# Patient Record
Sex: Male | Born: 1955
Health system: Southern US, Community
[De-identification: ages and names within clinical notes are randomized; demographics above are authoritative.]

## PROBLEM LIST (undated history)

## (undated) DIAGNOSIS — F1010 Alcohol abuse, uncomplicated: Secondary | ICD-10-CM

## (undated) DIAGNOSIS — T7840XA Allergy, unspecified, initial encounter: Secondary | ICD-10-CM

## (undated) DIAGNOSIS — J449 Chronic obstructive pulmonary disease, unspecified: Secondary | ICD-10-CM

## (undated) DIAGNOSIS — F17200 Nicotine dependence, unspecified, uncomplicated: Secondary | ICD-10-CM

## (undated) DIAGNOSIS — J189 Pneumonia, unspecified organism: Secondary | ICD-10-CM

## (undated) DIAGNOSIS — I2510 Atherosclerotic heart disease of native coronary artery without angina pectoris: Secondary | ICD-10-CM

## (undated) DIAGNOSIS — Z9981 Dependence on supplemental oxygen: Secondary | ICD-10-CM

## (undated) DIAGNOSIS — I219 Acute myocardial infarction, unspecified: Secondary | ICD-10-CM

## (undated) DIAGNOSIS — E785 Hyperlipidemia, unspecified: Secondary | ICD-10-CM

## (undated) DIAGNOSIS — A630 Anogenital (venereal) warts: Secondary | ICD-10-CM

## (undated) DIAGNOSIS — J309 Allergic rhinitis, unspecified: Secondary | ICD-10-CM

## (undated) DIAGNOSIS — I10 Essential (primary) hypertension: Secondary | ICD-10-CM

## (undated) DIAGNOSIS — H269 Unspecified cataract: Secondary | ICD-10-CM

## (undated) DIAGNOSIS — Z9289 Personal history of other medical treatment: Secondary | ICD-10-CM

## (undated) DIAGNOSIS — I714 Abdominal aortic aneurysm, without rupture, unspecified: Secondary | ICD-10-CM

## (undated) DIAGNOSIS — Z955 Presence of coronary angioplasty implant and graft: Secondary | ICD-10-CM

## (undated) HISTORY — DX: Anogenital (venereal) warts: A63.0

## (undated) HISTORY — DX: Atherosclerotic heart disease of native coronary artery without angina pectoris: I25.10

## (undated) HISTORY — PX: CARDIAC CATHETERIZATION: SHX172

## (undated) HISTORY — DX: Alcohol abuse, uncomplicated: F10.10

## (undated) HISTORY — DX: Essential (primary) hypertension: I10

## (undated) HISTORY — DX: Allergic rhinitis, unspecified: J30.9

## (undated) HISTORY — PX: VASECTOMY: SHX75

## (undated) HISTORY — PX: FINGER SURGERY: SHX640

## (undated) HISTORY — DX: Unspecified cataract: H26.9

## (undated) HISTORY — DX: Nicotine dependence, unspecified, uncomplicated: F17.200

## (undated) HISTORY — PX: CORONARY ANGIOPLASTY WITH STENT PLACEMENT: SHX49

## (undated) HISTORY — DX: Presence of coronary angioplasty implant and graft: Z95.5

## (undated) HISTORY — DX: Allergy, unspecified, initial encounter: T78.40XA

## (undated) HISTORY — DX: Hyperlipidemia, unspecified: E78.5

---

## 2004-03-10 ENCOUNTER — Observation Stay (HOSPITAL_COMMUNITY): Admission: AD | Admit: 2004-03-10 | Discharge: 2004-03-10 | Payer: Self-pay | Admitting: Orthopedic Surgery

## 2004-03-10 ENCOUNTER — Encounter: Payer: Self-pay | Admitting: Emergency Medicine

## 2006-07-24 ENCOUNTER — Ambulatory Visit: Payer: Self-pay | Admitting: Internal Medicine

## 2006-08-15 ENCOUNTER — Ambulatory Visit: Payer: Self-pay | Admitting: Internal Medicine

## 2006-08-15 LAB — CONVERTED CEMR LAB
AST: 25 units/L (ref 0–37)
Albumin: 4 g/dL (ref 3.5–5.2)
Alkaline Phosphatase: 166 units/L — ABNORMAL HIGH (ref 39–117)
Basophils Relative: 0.5 % (ref 0.0–1.0)
Bilirubin Urine: NEGATIVE
CO2: 30 meq/L (ref 19–32)
Chol/HDL Ratio, serum: 4.3
Cholesterol: 180 mg/dL (ref 0–200)
GFR calc non Af Amer: 69 mL/min
Glomerular Filtration Rate, Af Am: 83 mL/min/{1.73_m2}
Hemoglobin, Urine: NEGATIVE
Lymphocytes Relative: 19.7 % (ref 12.0–46.0)
MCHC: 34.4 g/dL (ref 30.0–36.0)
Monocytes Relative: 8.5 % (ref 3.0–11.0)
Neutro Abs: 5.4 10*3/uL (ref 1.4–7.7)
Neutrophils Relative %: 69.7 % (ref 43.0–77.0)
PSA: 0.76 ng/mL (ref 0.10–4.00)
Platelets: 238 10*3/uL (ref 150–400)
RBC: 5.19 M/uL (ref 4.22–5.81)
TSH: 1.02 microintl units/mL (ref 0.35–5.50)
Total Protein, Urine: NEGATIVE mg/dL
Triglyceride fasting, serum: 82 mg/dL (ref 0–149)
Urine Glucose: NEGATIVE mg/dL
Urobilinogen, UA: 0.2 (ref 0.0–1.0)
VLDL: 16 mg/dL (ref 0–40)
pH: 6 (ref 5.0–8.0)

## 2006-09-04 ENCOUNTER — Ambulatory Visit: Payer: Self-pay | Admitting: Internal Medicine

## 2006-10-01 ENCOUNTER — Ambulatory Visit: Payer: Self-pay | Admitting: Internal Medicine

## 2006-10-20 ENCOUNTER — Ambulatory Visit: Payer: Self-pay | Admitting: Internal Medicine

## 2010-09-16 DIAGNOSIS — I219 Acute myocardial infarction, unspecified: Secondary | ICD-10-CM

## 2010-09-16 HISTORY — DX: Acute myocardial infarction, unspecified: I21.9

## 2010-09-17 ENCOUNTER — Inpatient Hospital Stay (HOSPITAL_COMMUNITY)
Admission: EM | Admit: 2010-09-17 | Discharge: 2010-09-19 | Payer: Self-pay | Source: Home / Self Care | Attending: Internal Medicine | Admitting: Internal Medicine

## 2010-09-21 ENCOUNTER — Encounter: Payer: Self-pay | Admitting: Cardiovascular Disease

## 2010-10-04 ENCOUNTER — Ambulatory Visit
Admission: RE | Admit: 2010-10-04 | Discharge: 2010-10-04 | Payer: Self-pay | Source: Home / Self Care | Attending: Cardiovascular Disease | Admitting: Cardiovascular Disease

## 2010-10-04 DIAGNOSIS — F17210 Nicotine dependence, cigarettes, uncomplicated: Secondary | ICD-10-CM | POA: Insufficient documentation

## 2010-10-04 DIAGNOSIS — I10 Essential (primary) hypertension: Secondary | ICD-10-CM

## 2010-10-04 DIAGNOSIS — I2510 Atherosclerotic heart disease of native coronary artery without angina pectoris: Secondary | ICD-10-CM | POA: Insufficient documentation

## 2010-10-04 DIAGNOSIS — Z87891 Personal history of nicotine dependence: Secondary | ICD-10-CM | POA: Insufficient documentation

## 2010-10-04 DIAGNOSIS — E785 Hyperlipidemia, unspecified: Secondary | ICD-10-CM | POA: Insufficient documentation

## 2010-10-04 DIAGNOSIS — F17200 Nicotine dependence, unspecified, uncomplicated: Secondary | ICD-10-CM

## 2010-10-04 HISTORY — DX: Hyperlipidemia, unspecified: E78.5

## 2010-10-04 HISTORY — DX: Atherosclerotic heart disease of native coronary artery without angina pectoris: I25.10

## 2010-10-04 HISTORY — DX: Essential (primary) hypertension: I10

## 2010-10-04 HISTORY — DX: Nicotine dependence, unspecified, uncomplicated: F17.200

## 2010-10-18 NOTE — Discharge Summary (Addendum)
John [name], John [name]            ACCOUNT NO.:  [id number]  MEDICAL RECORD NO.:  [id number]          PATIENT TYPE:  INP  LOCATION:  2927                         FACILITY:  [facility]  PHYSICIAN:  Arturo Morton. Riley Kill, MD, FACCDATE OF BIRTH:  [birth year]  DATE OF ADMISSION:  09/17/2010 DATE OF DISCHARGE:  09/19/2010                              DISCHARGE SUMMARY   PRIMARY CARDIOLOGIST:  New patient to Dr. Verne Carrow.  PRIMARY CARE PHYSICIAN:  The patient has previously been seen by Dr. Artist Pais in 2007.  DISCHARGE DIAGNOSES: 1. Coronary artery disease, status post cardiac catheterization on     September 18, 2010, with successful percutaneous stenting of the     left anterior descending artery using a Promus Element drug-eluting     stent.  Moderate residual disease involving the circumflex right     coronary artery.  Anterolateral hypokinesis. 2. Dyslipidemia. 3. Tobacco abuse. 4. Alcohol abuse.  ALLERGIES:  PENICILLIUM causing the patient to pass out as a child.  DIAGNOSTICS/PROCEDURES PERFORMED DURING HOSPITALIZATION: 1. Left heart catheterization with selective coronary arteriography     and selective left ventriculography.     a.     Successful percutaneous coronary intervention with Promus      Element stent to the left anterior descending artery.     b.     Moderate residual disease involving the circumflex, diffuse      60-70% narrowing followed by 30 and a 70% stenoses distally.      Right coronary artery about 50% narrowing at the proximal mid      junction and 70% stenosis just distal to this.     c.     Anterolateral hypokinesis noted. 2. Chest x-ray showing no acute cardiopulmonary disease.  HISTORY OF PRESENT ILLNESS:  This is a 55 year old gentleman without prior cardiac history who presented to the [facility] Emergency Department with unstable angina.  The patient states for 2-3 weeks, he was experienced fairly exertional substernal chest pressure radiating to  his elbow, occasional episode of diaphoresis lasting 2-3 minutes when examining at the bed and worse spontaneously.  Several days prior to admission, the patient had recurrent angina at rest.  The patient's EKG in the emergency department showed small inferior Qs, but no acute ischemic changes.  The patient was admitted for diagnostic catheterization.  HOSPITAL COURSE:  The patient was admitted to CCU and placed on heparin, nitroglycerin, beta-blocker, statin, and aspirin.  The patient did rule in for non-ST-elevation myocardial infarction with peak troponin of 1.62.  The patient was taken to the Cath Lab by Dr. Riley Kill, informed consent was obtained.  As noted above, Dr. Riley Kill performed successful percutaneous stenting of the left anterior descending artery.  There was moderate residual disease of the circumflex right coronary artery. Stents were carefully reviewed with the interventionalist team and at this time, they would favor aggressive medical treatment of the moderate disease as they do not appear flow-limiting.  The patient tolerated the procedure well and his cath site was without signs of hematoma.  Risk factor reduction was discussed with the patient and his girlfriend and the patient appeared to be receptive.  He  does have an extensive tobacco history, smoking 3 packs a day.  Nicotine patch has been discussed with the patient and he states that he may be interested in quitting.  He also has heavy alcohol abuse, drinking daily.  Cessation has also been discussed with the patient.  The patient will be discharged on beta- blocker, aspirin, Effient, and Crestor.  On day of discharge, Dr. Riley Kill evaluated the patient and noted him stable for home.  He was without complaints of chest pain.  He did ambulate with cardiac rehab without shortness of breath or chest pain. Cardiac rehab has been offered to the patient about to the safety, cannot participate secondary to work.  The  patient also drinks several [employer] daily.  He has been encouraged to decrease caffeine intake.  The patient also takes [place] powders and NyQuil daily.  He has also been encouraged to discontinue these.  DISCHARGE LAB STUDIES:  WBC 10.4, hemoglobin 16.3, hematocrit 46.6, platelets 197.  Sodium 138, potassium 3.5, chloride 107, bicarb 25, BUN 11, creatinine 0.93.  Creatine kinase 87, CK-MB 4.7, troponin-I 0.91. Cholesterol 217, triglycerides 184, HDL 45, LDL 135.  Hemoglobin A1c 5.7.  DISCHARGE MEDICATIONS: 1. Effient 2 mg one tablet daily. 2. Crestor 40 mg one tablet daily. 3. Coreg 6.25 mg one tablet daily. 4. Aspirin 325 mg daily. 5. Nitroglycerin 0.4 mg sublingual one tablet under tongue as needed     for chest pain. 6. Multivitamin daily. 7. Nicotine 21 mg patch daily.  DISCHARGE PLANS AND INSTRUCTIONS: 1. The patient will follow up with Dr. Clifton James on October 04, 2009 at     10:15. 2. The patient is to follow up with Dr. Artist Pais for re-establishment     within the next week or so. 3. The patient is to increase activity slowly.  No lifting for 1 week     greater than 5 pounds.  No sexual activity for 1 week.  Continue     low-sodium heart-healthy diet.  Keep cath site clean and dry, and     he is to call our office for any problems.  DURATION OF DISCHARGE:  Greater than 30 minutes including physician and physician extender time.     Leonette Monarch, PA-C   ______________________________ Arturo Morton Riley Kill, MD, [facility]    NB/MEDQ  D:  09/19/2010  T:  09/19/2010  Job:  [id number]  cc:   Verne Carrow, MD Barbette Hair. Artist Pais, DO  Electronically Signed by Alen Blew P.A. on 10/02/2010 08:04:36 PM Electronically Signed by Shawnie Pons MD [facility] on 10/18/2010 04:41:44 AM

## 2010-10-25 NOTE — Letter (Signed)
Summary: Return To Work  [employer], Main Office  [address]   [place], [place] [address]   Phone: [phone]  Fax: [phone]    09/21/2010  TO: WHOM IT MAY CONCERN   RE: John [name] [address] ,[id number]   The above named individual is under my medical care and may return to work on: Tuesday, September 25, 2010.   If you have any further questions or need additional information, please call.     Sincerely,    Dr. Earney Hamburg, MD

## 2010-10-25 NOTE — Assessment & Plan Note (Signed)
Summary: eph  Medications Added ASPIRIN EC 325 MG TBEC (ASPIRIN) Take one tablet by mouth daily CARVEDILOL 6.25 MG TABS (CARVEDILOL) Take one tablet by mouth twice a day CRESTOR 40 MG TABS (ROSUVASTATIN CALCIUM) Take one tablet by mouth daily. MULTIVITAMINS   TABS (MULTIPLE VITAMIN) 1 tab by mouth once daily NITROSTAT 0.4 MG SUBL (NITROGLYCERIN) 1 tablet under tongue at onset of chest pain; you may repeat every 5 minutes for up to 3 doses. EFFIENT 10 MG TABS (PRASUGREL HCL) 1 tab by mouth once daily        Visit Type:  Follow-up Primary Provider:  Thomos Lemons, MD  CC:  follow up hospital.  History of Present Illness: 55 yo WM with history of tobacco abuse, HTN, Hyperlipidemia admitted to [facility] 09/16/10 with chest pain. He ruled in for a NSTEMI with cardiac enzymes. He underwent diagnostic heart cath on 09/17/10 and was found to have a severe stenosis of the LAD. Dr. Riley Kill placed a drug eluting stent in  the LAD. He was also found to have moderate disease in the RCA (70%) and Circumflex (70%) but this was not treated. He has done well since discharge. No chest pain or SOB. He has cut back to 2ppd.   Current Medications (verified): 1)  Aspirin Ec 325 Mg Tbec (Aspirin) .... Take One Tablet By Mouth Daily 2)  Carvedilol 6.25 Mg Tabs (Carvedilol) .... Take One Tablet By Mouth Twice A Day 3)  Crestor 40 Mg Tabs (Rosuvastatin Calcium) .... Take One Tablet By Mouth Daily. 4)  Multivitamins   Tabs (Multiple Vitamin) .Marland Kitchen.. 1 Tab By Mouth Once Daily 5)  Nitrostat 0.4 Mg Subl (Nitroglycerin) .Marland Kitchen.. 1 Tablet Under Tongue At Onset of Chest Pain; You May Repeat Every 5 Minutes For Up To 3 Doses. 6)  Effient 10 Mg Tabs (Prasugrel Hcl) .Marland Kitchen.. 1 Tab By Mouth Once Daily  Past History:  Past Medical History:  Coronary artery disease, status post cardiac catheterization on       September 18, 2010, with successful percutaneous stenting of the       left anterior descending artery using a Promus  Element drug-eluting       stent.  Moderate residual disease involving the circumflex right       coronary artery.  Anterolateral hypokinesis.  Dyslipidemia.   Tobacco abuse 2ppd  (Former 4ppd)   Alcohol abuse      Past Surgical History: Finger trauma-almost cut it off Vasectomy  Family History: Reviewed history from 10/03/2010 and no changes required. Mother died of brain cancer at 2 Father died of an  MI at 40.   He has two uncles on his father's side who are status post   CABG.   He has a brother who he believes to be alive and well.   Social History: Reviewed history from 10/03/2010 and no changes required. The patient lives in [place] with his girlfriend.  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 Former 4ppd for 25 years.   He drinks a 6-pack of beer per night.   Denies drug use.  He is not routinely exercising.      Review of Systems  The patient denies fatigue, malaise, fever, weight gain/loss, vision loss, decreased hearing, hoarseness, chest pain, palpitations, shortness of breath, prolonged cough, wheezing, sleep apnea, coughing up blood, abdominal pain, blood in  stool, nausea, vomiting, diarrhea, heartburn, incontinence, blood in urine, muscle weakness, joint pain, leg swelling, rash, skin lesions, headache, fainting, dizziness, depression, anxiety, enlarged lymph nodes, easy bruising or bleeding, and environmental allergies.    Vital Signs:  Patient profile:   55 year old male Height:      71 inches Weight:      180 pounds BMI:     25.20 Pulse rate:   73 / minute Resp:     14 per minute BP sitting:   130 / 70  (left arm)  Vitals Entered By: Kem Parkinson (October 04, 2010 3:58 PM)  Physical Exam  General:  General: Well developed, well nourished, NAD HEENT: OP clear, mucus membranes moist SKIN: warm, dry Neuro: No focal  deficits Musculoskeletal: Muscle strength 5/5 all ext Psychiatric: Mood and affect normal Neck: No JVD, no carotid bruits, no thyromegaly, no lymphadenopathy. Lungs:Clear bilaterally, no wheezes, rhonci, crackles CV: RRR no murmurs, gallops rubs Abdomen: soft, NT, ND, BS present Extremities: No edema, pulses 2+.    Cardiac Cath  Procedure date:  09/17/2010  Findings:      LAD 95%. There was some narrowing of the septal and diagonal, but TIMI 3 flow in both.  The circumflex is a moderate-sized vessel.  There is diffuse 60-70% narrowing followed by 30% and a 70% stenosis more distally.  Distal runoff is good.  The right coronary artery has about 50% narrowing right at the proximal mid junction and a 70% stenosis just distal to this.  The vessel opens up into the posterolateral segment.  Ventriculography in the RAO projection reveals anterolateral hypokinesis.  s/p PTCA/DES LAD by Dr. Riley Kill  Impression & Recommendations:  Problem # 1:  CAD, NATIVE VESSEL (ICD-414.01) Stable post PCI. Residual disease in RCA and Circumflex. Will continue current meds. Will need lipids and LFTs in 12 weeks. Will see in 3 months. Manage moderate disease medically for now.   His updated medication list for this problem includes:    Aspirin Ec 325 Mg Tbec (Aspirin) .Marland Kitchen... Take one tablet by mouth daily    Carvedilol 6.25 Mg Tabs (Carvedilol) .Marland Kitchen... Take one tablet by mouth twice a day    Nitrostat 0.4 Mg Subl (Nitroglycerin) .Marland Kitchen... 1 tablet under tongue at onset of chest pain; you may repeat every 5 minutes for up to 3 doses.    Effient 10 Mg Tabs (Prasugrel hcl) .Marland Kitchen... 1 tab by mouth once daily  Problem # 2:  HYPERTENSION, BENIGN (ICD-401.1) Stable.   His updated medication list for this problem includes:    Aspirin Ec 325 Mg Tbec (Aspirin) .Marland Kitchen... Take one tablet by mouth daily    Carvedilol 6.25 Mg Tabs (Carvedilol) .Marland Kitchen... Take one tablet by mouth twice a day  Problem # 3:  HYPERLIPIDEMIA-MIXED  (ICD-272.4) Continue statin.   His updated medication list for this problem includes:    Crestor 40 Mg Tabs (Rosuvastatin calcium) .Marland Kitchen... Take one tablet by mouth daily.  Problem # 4:  TOBACCO ABUSE (ICD-305.1) Smoking cessation encouraged.   Patient Instructions: 1)  Your physician recommends that you schedule a follow-up appointment in: 3  months 2)  Your physician recommends that you continue on your current medications as directed. Please refer to the Current Medication list given to you today. Prescriptions: EFFIENT 10 MG TABS (PRASUGREL HCL) 1 tab by mouth once daily  #90 x 3   Entered by:   Kem Parkinson   Authorized by:   Verne Carrow, MD   Signed by:  Kimalexis Barnes on 10/04/2010   Method used:   Faxed to ...       [facility] (mail-order)             , [place]         Ph: [phone]       Fax: [phone]   RxID:   [id number] CRESTOR 40 MG TABS (ROSUVASTATIN CALCIUM) Take one tablet by mouth daily.  #90 x 3   Entered by:   Kem Parkinson   Authorized by:   Verne Carrow, MD   Signed by:   Kem Parkinson on 10/04/2010   Method used:   Faxed to ...       [facility] (mail-order)             , [place]         Ph: [phone]       Fax: [phone]   RxID:   [id number] CARVEDILOL 6.25 MG TABS (CARVEDILOL) Take one tablet by mouth twice a day  #180 x 3   Entered by:   Kem Parkinson   Authorized by:   Verne Carrow, MD   Signed by:   Kem Parkinson on 10/04/2010   Method used:   Faxed to ...       [facility] (mail-order)             , [place]         Ph: [phone]       Fax: [phone]   RxID:   [id number]

## 2010-12-03 LAB — COMPREHENSIVE METABOLIC PANEL
ALT: 28 U/L (ref 0–53)
AST: 28 U/L (ref 0–37)
Albumin: 4.1 g/dL (ref 3.5–5.2)
Alkaline Phosphatase: 142 U/L — ABNORMAL HIGH (ref 39–117)
BUN: 12 mg/dL (ref 6–23)
BUN: 5 mg/dL — ABNORMAL LOW (ref 6–23)
CO2: 24 mEq/L (ref 19–32)
CO2: 28 mEq/L (ref 19–32)
Calcium: 9.1 mg/dL (ref 8.4–10.5)
Chloride: 97 mEq/L (ref 96–112)
Creatinine, Ser: 0.91 mg/dL (ref 0.4–1.5)
Creatinine, Ser: 1.01 mg/dL (ref 0.4–1.5)
GFR calc non Af Amer: >60 mL/min (ref >60)
Glucose, Bld: 97 mg/dL (ref 70–99)
Total Bilirubin: 0.6 mg/dL (ref 0.3–1.2)
Total Protein: 5.9 g/dL — ABNORMAL LOW (ref 6.0–8.3)
Total Protein: 7.2 g/dL (ref 6.0–8.3)

## 2010-12-03 LAB — CBC
Hemoglobin: 19.2 g/dL — ABNORMAL HIGH (ref 13.0–17.0)
MCHC: 34.6 g/dL (ref 30.0–36.0)
RBC: 4.56 MIL/uL (ref 4.22–5.81)
RBC: 4.74 MIL/uL (ref 4.22–5.81)
RBC: 5.25 MIL/uL (ref 4.22–5.81)
RDW: 12.3 % (ref 11.5–15.5)
WBC: 10.2 10*3/uL (ref 4.0–10.5)
WBC: 10.4 10*3/uL (ref 4.0–10.5)

## 2010-12-03 LAB — CARDIAC PANEL(CRET KIN+CKTOT+MB+TROPI)
CK, MB: 10.9 ng/mL (ref 0.3–4.0)
CK, MB: 4.7 ng/mL — ABNORMAL HIGH (ref 0.3–4.0)
CK, MB: 5.5 ng/mL — ABNORMAL HIGH (ref 0.3–4.0)
Relative Index: 7.9 — ABNORMAL HIGH (ref 0.0–2.5)
Relative Index: INVALID (ref 0.0–2.5)
Total CK: 134 U/L (ref 7–232)
Total CK: 163 U/L (ref 7–232)
Total CK: 91 U/L (ref 7–232)
Troponin I: 0.55 ng/mL (ref 0.00–0.06)
Troponin I: 0.91 ng/mL (ref 0.00–0.06)
Troponin I: 1.18 ng/mL (ref 0.00–0.06)
Troponin I: 1.2 ng/mL (ref 0.00–0.06)
Troponin I: 1.62 ng/mL (ref 0.00–0.06)

## 2010-12-03 LAB — POCT CARDIAC MARKERS
CKMB, poc: 3.1 ng/mL (ref 1.0–8.0)
Troponin i, poc: <0.05 ng/mL (ref 0.00–0.09)

## 2010-12-03 LAB — LIPID PANEL
Cholesterol: 217 mg/dL — ABNORMAL HIGH (ref 0–200)
LDL Cholesterol: 135 mg/dL — ABNORMAL HIGH (ref 0–99)
Triglycerides: 184 mg/dL — ABNORMAL HIGH (ref <150)
VLDL: 37 mg/dL (ref 0–40)

## 2010-12-03 LAB — DIFFERENTIAL
Basophils Absolute: 0.1 10*3/uL (ref 0.0–0.1)
Eosinophils Relative: 2 % (ref 0–5)
Lymphocytes Relative: 32 % (ref 12–46)
Lymphs Abs: 2.9 10*3/uL (ref 0.7–4.0)
Neutro Abs: 5.3 10*3/uL (ref 1.7–7.7)
Neutrophils Relative %: 58 % (ref 43–77)

## 2010-12-03 LAB — BASIC METABOLIC PANEL
BUN: 11 mg/dL (ref 6–23)
Calcium: 8.8 mg/dL (ref 8.4–10.5)
GFR calc Af Amer: >60 mL/min (ref >60)
GFR calc non Af Amer: >60 mL/min (ref >60)
Glucose, Bld: 99 mg/dL (ref 70–99)

## 2010-12-03 LAB — TSH: TSH: 3.921 u[IU]/mL (ref 0.350–4.500)

## 2010-12-03 LAB — PROTIME-INR: INR: 0.97 (ref 0.00–1.49)

## 2010-12-03 LAB — HEPARIN LEVEL (UNFRACTIONATED)
Heparin Unfractionated: 0.31 IU/mL (ref 0.30–0.70)
Heparin Unfractionated: <0.1 IU/mL — ABNORMAL LOW (ref 0.30–0.70)

## 2010-12-03 LAB — HEMOGLOBIN A1C: Mean Plasma Glucose: 117 mg/dL — ABNORMAL HIGH (ref <117)

## 2010-12-27 ENCOUNTER — Other Ambulatory Visit (INDEPENDENT_AMBULATORY_CARE_PROVIDER_SITE_OTHER): Payer: BC Managed Care – PPO

## 2010-12-27 ENCOUNTER — Ambulatory Visit (INDEPENDENT_AMBULATORY_CARE_PROVIDER_SITE_OTHER): Payer: BC Managed Care – PPO | Admitting: Internal Medicine

## 2010-12-27 ENCOUNTER — Encounter: Payer: Self-pay | Admitting: Internal Medicine

## 2010-12-27 VITALS — BP 142/82 | HR 77 | Temp 98.1°F | Resp 16 | Ht 70.5 in | Wt 170.2 lb

## 2010-12-27 DIAGNOSIS — J309 Allergic rhinitis, unspecified: Secondary | ICD-10-CM

## 2010-12-27 DIAGNOSIS — Z0000 Encounter for general adult medical examination without abnormal findings: Secondary | ICD-10-CM

## 2010-12-27 DIAGNOSIS — F1010 Alcohol abuse, uncomplicated: Secondary | ICD-10-CM

## 2010-12-27 HISTORY — DX: Allergic rhinitis, unspecified: J30.9

## 2010-12-27 HISTORY — DX: Alcohol abuse, uncomplicated: F10.10

## 2010-12-27 NOTE — Progress Notes (Signed)
Subjective:    Patient ID: John [name], male    DOB: 06/05/1956, 55 y.o.   MRN: [id number]  HPI  Here for wellness and f/u;  Overall doing ok;  Pt denies CP, worsening SOB, DOE, wheezing, orthopnea, PND, worsening LE edema, palpitations, dizziness or syncope.  Pt denies neurological change such as new Headache, facial or extremity weakness.  Pt denies polydipsia, polyuria, or low sugar symptoms. Pt states overall good compliance with treatment and medications, good tolerability, and trying to follow lower cholesterol diet.  Pt denies worsening depressive symptoms, suicidal ideation or panic. No fever, wt loss, night sweats, loss of appetite, or other constitutional symptoms.  Pt states good ability with ADL's, low fall risk, home safety reviewed and adequate, no significant changes in hearing or vision, and occasionally active with exercise.  No complaints today Past Medical History  Diagnosis Date  . HYPERLIPIDEMIA-MIXED 10/04/2010  . TOBACCO ABUSE 10/04/2010  . HYPERTENSION, BENIGN 10/04/2010  . CAD, NATIVE VESSEL 10/04/2010  . Alcohol abuse 12/27/2010  . Allergic rhinitis, cause unspecified 12/27/2010   Past Surgical History  Procedure Date  . Finger trauma     almost cut off  . Vasectomy     reports that he has been smoking.  He does not have any smokeless tobacco history on file. He reports that he drinks alcohol. He reports that he does not use illicit drugs. family history is not on file. Allergies  Allergen Reactions  . Penicillins    Current Outpatient Prescriptions on File Prior to Visit  Medication Sig Dispense Refill  . aspirin 325 MG tablet Take 325 mg by mouth daily.        . carvedilol (COREG) 6.25 MG tablet Take 6.25 mg by mouth 2 (two) times daily.        . Multiple Vitamin (MULTIVITAMIN) capsule Take 1 capsule by mouth daily.        . nitroGLYCERIN (NITROSTAT) 0.4 MG SL tablet Place 0.4 mg under the tongue. 1 tablet under tongue at onset of chest pain, you may repeat  every 5 minutes for up to 3 doses.       . prasugrel (EFFIENT) 10 MG TABS Take by mouth daily.        . rosuvastatin (CRESTOR) 40 MG tablet Take 40 mg by mouth daily.         Review of Systems Review of Systems  Constitutional: Negative for diaphoresis, activity change, appetite change and unexpected weight change.  HENT: Negative for hearing loss, ear pain, facial swelling, mouth sores and neck stiffness.   Eyes: Negative for pain, redness and visual disturbance.  Respiratory: Negative for shortness of breath and wheezing.   Cardiovascular: Negative for chest pain and palpitations.  Gastrointestinal: Negative for diarrhea, blood in stool, abdominal distention and rectal pain.  Genitourinary: Negative for hematuria, flank pain and decreased urine volume.  Musculoskeletal: Negative for myalgias and joint swelling.  Skin: Negative for color change and wound.  Neurological: Negative for syncope and numbness.  Hematological: Negative for adenopathy.  Psychiatric/Behavioral: Negative for hallucinations, self-injury, decreased concentration and agitation.      Objective:   Physical ExamBP 142/82  Pulse 77  Temp(Src) 98.1 F (36.7 C) (Oral)  Resp 16  Ht 5' 10.5" (1.791 m)  Wt 170 lb 4 oz (77.225 kg)  BMI 24.08 kg/m2  SpO2 97%  Physical Exam  VS noted Constitutional: Pt is oriented to person, place, and time. Appears well-developed and well-nourished.  HENT:  Head: Normocephalic and atraumatic.  Right Ear: External ear normal.  Left Ear: External ear normal.  Nose: Nose normal.  Mouth/Throat: Oropharynx is clear and moist.  Eyes: Conjunctivae and EOM are normal. Pupils are equal, round, and reactive to light.  Neck: Normal range of motion. Neck supple. No JVD present. No tracheal deviation present.  Cardiovascular: Normal rate, regular rhythm, normal heart sounds and intact distal pulses.   Pulmonary/Chest: Effort normal and breath sounds normal.  Abdominal: Soft. Bowel sounds are  normal. There is no tenderness.  Musculoskeletal: Normal range of motion. Exhibits no edema.  Lymphadenopathy:  Has no cervical adenopathy.  Neurological: Pt is alert and oriented to person, place, and time. Pt has normal reflexes. No cranial nerve deficit.  Skin: Skin is warm and dry. No rash noted.  Psychiatric:  Has  normal mood and affect. Behavior is normal except mild anxious         Assessment & Plan:

## 2010-12-27 NOTE — Assessment & Plan Note (Signed)

## 2010-12-27 NOTE — Patient Instructions (Signed)
Continue all other medications as before Please go to LAB in the Basement for the blood and/or urine tests to be done today Please call the number on the [facility] Card (the [employer] System) for results of testing in 2-3 days Please return in 6 months, or sooner if needed

## 2010-12-27 NOTE — Progress Notes (Signed)
Quick Note:  Voice message left on [employer] system - lab is negative, normal or otherwise stable, pt to continue same tx ______ 

## 2011-01-02 ENCOUNTER — Encounter: Payer: Self-pay | Admitting: Cardiovascular Disease

## 2011-01-02 ENCOUNTER — Ambulatory Visit (INDEPENDENT_AMBULATORY_CARE_PROVIDER_SITE_OTHER): Payer: BC Managed Care – PPO | Admitting: Cardiovascular Disease

## 2011-01-02 VITALS — BP 158/84 | HR 70 | Resp 18 | Ht 71.0 in | Wt 170.0 lb

## 2011-01-02 DIAGNOSIS — I10 Essential (primary) hypertension: Secondary | ICD-10-CM

## 2011-01-02 DIAGNOSIS — E785 Hyperlipidemia, unspecified: Secondary | ICD-10-CM

## 2011-01-02 DIAGNOSIS — F17200 Nicotine dependence, unspecified, uncomplicated: Secondary | ICD-10-CM

## 2011-01-02 DIAGNOSIS — R0600 Dyspnea, unspecified: Secondary | ICD-10-CM

## 2011-01-02 DIAGNOSIS — Z720 Tobacco use: Secondary | ICD-10-CM

## 2011-01-02 DIAGNOSIS — I2510 Atherosclerotic heart disease of native coronary artery without angina pectoris: Secondary | ICD-10-CM

## 2011-01-02 DIAGNOSIS — R0609 Other forms of dyspnea: Secondary | ICD-10-CM

## 2011-01-02 MED ORDER — AMLODIPINE BESYLATE 10 MG PO TABS: 10.0000 mg | ORAL_TABLET | Freq: Every day | ORAL | Status: DC

## 2011-01-02 MED ORDER — CARVEDILOL 3.125 MG PO TABS: 3.1250 mg | ORAL_TABLET | Freq: Two times a day (BID) | ORAL | Status: DC

## 2011-01-02 MED ORDER — PRASUGREL HCL 10 MG PO TABS: 10.0000 mg | ORAL_TABLET | Freq: Every day | ORAL | Status: DC

## 2011-01-02 MED ORDER — ROSUVASTATIN CALCIUM 40 MG PO TABS: 40.0000 mg | ORAL_TABLET | Freq: Every day | ORAL | Status: DC

## 2011-01-02 NOTE — Assessment & Plan Note (Signed)
BP is still elevated. Will add Norvasc  10 mg po Qdaily.

## 2011-01-02 NOTE — Patient Instructions (Signed)
Your physician recommends that you schedule a follow-up appointment in: 6-8 weeks with Dr. Clifton James  Your physician has recommended you make the following change in your medication:  DECREASE COREG 3.125 mg by mouth twice daily. INCREASE NORVASC to 10 mg daily.   Your physician has requested that you have an echocardiogram. Echocardiography is a painless test that uses sound waves to create images of your heart. It provides your doctor with information about the size and shape of your heart and how well your heart's chambers and valves are working. This procedure takes approximately one hour. There are no restrictions for this procedure.

## 2011-01-02 NOTE — Progress Notes (Signed)
History of Present Illness:54 yo WM with history of tobacco abuse, CAD , HTN and hyperlipidemia admitted to [facility] 09/16/10 with chest pain. He ruled in for a NSTEMI with cardiac enzymes. He underwent diagnostic heart cath on 09/17/10 and was found to have a severe stenosis of the LAD. Dr. Riley Kill placed a drug eluting stent in  the LAD. He was also found to have moderate disease in the RCA (70%) and Circumflex (70%) but this was not treated. I saw him in January for hospital follow up. He is here today for follow up. He is still smoking. He describes overall fatigue, feeling constantly tired, some dyspnea. No chest pain.   Past Medical History  Diagnosis Date  . HYPERLIPIDEMIA-MIXED 10/04/2010  . TOBACCO ABUSE 10/04/2010  . HYPERTENSION, BENIGN 10/04/2010  . CAD, NATIVE VESSEL 10/04/2010  . Alcohol abuse 12/27/2010  . Allergic rhinitis, cause unspecified 12/27/2010    Past Surgical History  Procedure Date  . Finger trauma     almost cut off  . Vasectomy     Current Outpatient Prescriptions  Medication Sig Dispense Refill  . aspirin 325 MG tablet Take 325 mg by mouth daily.        . carvedilol (COREG) 6.25 MG tablet Take 6.25 mg by mouth 2 (two) times daily.        . Multiple Vitamin (MULTIVITAMIN) capsule Take 1 capsule by mouth daily.        . nitroGLYCERIN (NITROSTAT) 0.4 MG SL tablet Place 0.4 mg under the tongue. 1 tablet under tongue at onset of chest pain, you may repeat every 5 minutes for up to 3 doses.       . prasugrel (EFFIENT) 10 MG TABS Take by mouth daily.        . rosuvastatin (CRESTOR) 40 MG tablet Take 40 mg by mouth daily.          Allergies  Allergen Reactions  . Penicillins     History   Social History  . Marital Status: Single    Spouse Name: N/A    Number of Children: N/A  . Years of Education: N/A   Occupational History  . Electrical maintenance work      [employer]   Social History Main Topics  . Smoking status: Current Everyday Smoker  .  Smokeless tobacco: Not on file   Comment: 2 plus packs per day. He has a 100 + pack - year history of tobacco abuse currently. Former 4 ppd for 25 years.  . Alcohol Use: Yes     He drinks a 6 - pack of beer per night  . Drug Use: No  . Sexually Active: Not on file   Other Topics Concern  . Not on file   Social History Narrative   The patient lives in [place] with his girlfriend. 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    No family history on file.  Review of Systems:  As stated in the HPI and otherwise negative.   BP 158/84  Pulse 70  Resp 18  Ht 5\' 11"  (1.803 m)  Wt 170 lb (77.111 kg)  BMI 23.71 kg/m2  Physical Examination: General: Well developed, well nourished, NAD HEENT: OP clear, mucus membranes moist SKIN: warm, dry. No rashes. Neuro: No focal deficits Musculoskeletal: Muscle strength 5/5 all ext Psychiatric: Mood and affect normal Neck: No JVD, no carotid bruits, no thyromegaly, no lymphadenopathy. Lungs:Clear bilaterally, no wheezes, rhonci, crackles Cardiovascular: Regular rate and  rhythm. No murmurs, gallops or rubs. Abdomen:Soft. Bowel sounds present. Non-tender.  Extremities: No lower extremity edema. Pulses are 2 + in the bilateral DP/PT.  EKG:

## 2011-01-02 NOTE — Assessment & Plan Note (Signed)
Cessation encouraged 

## 2011-01-02 NOTE — Assessment & Plan Note (Signed)
Will check echo to assess LVEF post NSTEMI.

## 2011-01-02 NOTE — Assessment & Plan Note (Addendum)
Stable. He has had no chest pain. Fatigue may be due to the beta blocker. I will reduce his Coreg to 3.125 mg po BID. Continue ASA, Effient, statin. Samples of Effient and Crestor given today.

## 2011-01-09 ENCOUNTER — Ambulatory Visit (HOSPITAL_COMMUNITY): Payer: BC Managed Care – PPO | Attending: Cardiovascular Disease | Admitting: Radiology

## 2011-01-09 DIAGNOSIS — R0600 Dyspnea, unspecified: Secondary | ICD-10-CM

## 2011-01-09 DIAGNOSIS — I2510 Atherosclerotic heart disease of native coronary artery without angina pectoris: Secondary | ICD-10-CM

## 2011-01-09 DIAGNOSIS — R5383 Other fatigue: Secondary | ICD-10-CM

## 2011-01-09 DIAGNOSIS — R072 Precordial pain: Secondary | ICD-10-CM

## 2011-01-09 DIAGNOSIS — R0989 Other specified symptoms and signs involving the circulatory and respiratory systems: Secondary | ICD-10-CM | POA: Insufficient documentation

## 2011-01-09 DIAGNOSIS — R5381 Other malaise: Secondary | ICD-10-CM

## 2011-01-09 DIAGNOSIS — R0609 Other forms of dyspnea: Secondary | ICD-10-CM | POA: Insufficient documentation

## 2011-01-25 ENCOUNTER — Telehealth: Payer: Self-pay | Admitting: Cardiovascular Disease

## 2011-01-25 NOTE — Telephone Encounter (Signed)
Per pt girl-friend called- wants to discuss smoking options.

## 2011-01-25 NOTE — Telephone Encounter (Signed)
Pt's girlfriend calling wanting information on smoking cessation--info given--they will call back when they decide which therapy they want--nt

## 2011-02-08 NOTE — Op Note (Signed)
NAME:  John [name], John [name]                      ACCOUNT NO.:  [id number]   MEDICAL RECORD NO.:  [id number]                   PATIENT TYPE:  INP   LOCATION:  2550                                 FACILITY:  [facility]   PHYSICIAN:  Katy Fitch. Naaman Plummer., M.D.          DATE OF BIRTH:  12/22/1955   DATE OF PROCEDURE:  03/10/2004  DATE OF DISCHARGE:  03/10/2004                                 OPERATIVE REPORT   PREOPERATIVE DIAGNOSIS:  Status post crushing injury to left index finger  due to jack failure and transmission crushing episode while working on a  motor vehicle.   POSTOPERATIVE DIAGNOSIS:  Status post crushing injury to left index finger  due to jack failure and transmission crushing episode while working on a  motor vehicle.  Identification of open fracture of distal phalangeal shaft  and complex disruption of dorsal intermediate and ventral nail fold.   OPERATION PERFORMED:  1. Irrigation and debridement of open fracture of left index finger distal     phalanx.  2. Open reduction internal fixation of left index finger distal phalanx     utilizing a 0.035 inch intramedullary Kirschner wire crossing the distal     interphalangeal joint.  3. Reconstruction of the pulp and nail fold utilizing 7-0 chromic sutures in     the nail matrix, dorsal, intermediate and ventral nail fold and 5-0 nylon     suture in the pulp.   SURGEON:  Katy Fitch. Sypher, M.D.   ASSISTANT:  Jonni Sanger, P.A.   ANESTHESIA:  2% lidocaine and 0.25% Marcaine metacarpal head level block,  left index finger.   SUPERVISING ANESTHESIOLOGIST:  Kaylyn Layer. Michelle Piper, M.D.   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  He had a complex open fracture of the distal  phalangeal segment of the left index finger with  complex soft tissue  disruption.  His neurovascular bundles were exposed.  However, he did  maintain some stability in the displaced pulp.  He was seen at [facility] by Rhae Lerner. Margretta Ditty, M.D., attending emergency room physician,  who made the diagnosis of an open fracture.  Dr. Margretta Ditty attempted to  contact Vickki Hearing, M.D., the attending orthopedic surgeon on call  and Dr. Romeo Apple essentially declined attending in the emergency room.  Therefore, Mr. Fyock was transferred to the [facility] operating  room directly for appropriate care of his injury.  After informed consent,  he was brought to the operating room at this time.  Preoperatively, he was  noted to be allergic to penicillin.  He was given 1 g Ancef without  consequence in the emergency room by Dr. Margretta Ditty and has been given tetanus  prophylaxis.   DESCRIPTION OF PROCEDURE:  Chrles Selley was brought to the operating  room and placed in supine position on the operating table.  Following light  sedation, the left arm was prepped with Betadine and 0.25% Marcaine and 2%  lidocaine metacarpal head level block was placed with sheath technique as  well as infiltration surrounding the metacarpal neck to obtain anesthesia of  the common sensory branches and dorsal radial sensory branches serving the  index finger.   When anesthesia was satisfactory, the index finger was thoroughly scrubbed  with Betadine soap and solution and irrigated with 1 L of sterile saline  followed by routine Betadine prep.  The finger was exsanguinated with a  gauze wrap and a half inch Penrose drain placed at the P1 segment as a  digital tourniquet.  The fracture was irrigated and clot and other debris  removed.  A 0.035 inch Kirschner wire was placed with intramedullary  technique retrograde style anatomically reducing the distal phalanx and  securing the DIP joint at neutral.  The nail matrix was reassembled jigsaw   puzzle style utilizing  multiple interrupted sutures of 7-0 chromic.  The  dorsal, intermediate and ventral nail fold were anatomically repaired.  The  nail plate was replaced after cleaning and soaking in Betadine.  The pulp  lacerations were then repaired with interrupted suture of 5-0 nylon.  The  finger as dressed with Xeroflo, sterile gauze and Coban.  An Alumafoam split  was placed to protect the construct.   For aftercare, Mr. Grizzell is advised to elevate his hand for one week.  He  has been given prescriptions for Percocet 5 mg one or two tablets by mouth  every four to six hours as needed for pain, 28 tablets without refill.  He  is encouraged to use Motrin as an additional analgesic.  He was given  Levaquin 500 mg one by mouth daily times five days as a prophylactic  antibiotic.                                               Katy Fitch Naaman Plummer., M.D.    RVS/MEDQ  D:  03/10/2004  T:  03/12/2004  Job:  [id number]   cc:   Rhae Lerner. Margretta Ditty, M.D.  [address]  [place]  [place] [address]

## 2011-02-08 NOTE — Assessment & Plan Note (Signed)
[facility]                             PRIMARY CARE OFFICE NOTE   NAME:John [name], John [name]                   MRN:          [id number]  DATE:07/24/2006                            DOB:          07/10/1956    CHIEF COMPLAINT:  New patient to the practice.   HISTORY OF PRESENT ILLNESS:  The patient is a 55 year old white male here to  establish primary care. He has lived in the [place] area for many years,  but has not had a primary care physician. The patient has a long history of  tobacco abuse, but denies any history of high blood pressure, heart disease  or stroke.   The main reason for his visit today has been difficulty with anal  soreness/growths in the anal area. He states that he has had some mild  bleeding, but denies history of recurrent hemorrhoids.   PAST MEDICAL HISTORY:  1. Tobacco abuse.  2. Childhood chickenpox.  3. Chronic alcohol use.   CURRENT MEDICATIONS:  None.   ALLERGIES TO MEDICATIONS:  PENICILLIN.   SOCIAL HISTORY:  The patient is divorced and is currently living with a  girlfriend and has three grown children.   FAMILY HISTORY:  Father deceased at age 56, secondary to myocardial  infarction. Mother is alive at age 77. He is unsure if she has had a minor  heart attack in the past. He also has several uncles with coronary artery  disease. The patient denies any family history of cancer. No diabetes.   HABITS:  He averages approximately 6-8 beers per night and also has been  smoking 2-3 packs per day since age 5. Denies recreational drug use.   REVIEW OF SYSTEMS:  No fevers, chills. No HEENT symptoms. The patient has  mild dyspnea on exertion, but denies any chest pain. Denies any chronic  cough. Occasional heartburn. No nausea or vomiting, constipation or  diarrhea. He has some difficulty with passing stools. No dysuria, frequency,  urgency and all other systems negative.   PHYSICAL EXAMINATION:  VITAL SIGNS:  Height: 5 feet 11 inches. Weight: 178  pounds. Temperature: 97.2. Pulse: 94. Blood pressure: 144/85 in the left arm  in a seated position.  GENERAL: The patient is a pleasant, well-developed, well-nourished 49-year-  old white male who appears slightly older than his stated age.  HEENT: Normocephalic, atraumatic. Pupils are equal and reactive to light  bilaterally. Extraocular muscle was intact. The patient was anicteric.  Conjunctivae were within normal limits. External auditory canals and  tympanic membranes are clear bilaterally. Hearing was grossly normal.  Oropharyngeal examination was unremarkable.  NECK:  Was supple. No adenopathy or carotid bruits or thyromegaly.  CHEST: Normal respiratory effort. Chest was clear to auscultation  bilaterally. No rhonchi, rales or wheezing.  CARDIOVASCULAR: Regular rate and rhythm. No significant murmurs, rubs or  gallops appreciated.  ABDOMEN: Soft and nontender. Positive bowel sounds. No organomegaly.  RECTAL: Examination of his rectal area showed large verrucous growths,  unable to perform rectal examination due to patient discomfort.  Examination of his penis noted a penile lesion on mid-shaft.  MUSCULOSKELETAL: No  clubbing, cyanosis or edema.  SKIN: Was warm and dry.  NEUROLOGIC: Cranial nerves II-XII grossly intact, nonfocal.   IMPRESSION/RECOMMENDATIONS:  1. Condyloma anus, rule out cancer.  2. Ongoing tobacco abuse.  3. Alcohol abuse.  4. Elevated blood pressure without diagnosis of hypertension.  5. Health maintenance.   The patient will be referred to [facility] for surgical  consultation. The patient will need biopsy as he is at high risk for  squamous cell cancer of the anus.   I offered Chantix therapy, he declined. We discussed decreasing his alcohol  intake over the next 1-2 months time and try to limit his alcohol intake to  approximately 7 drinks per week.   We will obtain routine labs including blood sugar  and lipid profile for  cardiovascular risk stratification. He will follow up with me in  approximately 4-6 weeks' time.     Barbette Hair. Artist Pais, DO  Electronically Signed    RDY/MedQ  DD: 07/24/2006  DT: 07/24/2006  Job #: [id number]

## 2011-02-20 ENCOUNTER — Ambulatory Visit (INDEPENDENT_AMBULATORY_CARE_PROVIDER_SITE_OTHER): Payer: BC Managed Care – PPO | Admitting: Cardiovascular Disease

## 2011-02-20 ENCOUNTER — Encounter: Payer: Self-pay | Admitting: Cardiovascular Disease

## 2011-02-20 VITALS — BP 128/76 | HR 77 | Resp 18 | Ht 71.0 in | Wt 165.4 lb

## 2011-02-20 DIAGNOSIS — F17200 Nicotine dependence, unspecified, uncomplicated: Secondary | ICD-10-CM

## 2011-02-20 DIAGNOSIS — I2510 Atherosclerotic heart disease of native coronary artery without angina pectoris: Secondary | ICD-10-CM

## 2011-02-20 DIAGNOSIS — R079 Chest pain, unspecified: Secondary | ICD-10-CM

## 2011-02-20 MED ORDER — VARENICLINE TARTRATE 1 MG PO TABS: 1.0000 mg | ORAL_TABLET | Freq: Two times a day (BID) | ORAL | Status: AC

## 2011-02-20 NOTE — Assessment & Plan Note (Signed)
He wishes to stop smoking. Will write for Chantix. Risks reviewed with pt.

## 2011-02-20 NOTE — Assessment & Plan Note (Signed)
He has an LAD stent with moderate disease in his RCA and Circumflex. Now having daily chest pain although it is mild. Daily fatigue. Will arrange exercise myoview to localize ischemia. If evidence of inferior or lateral ischemia, will need cath/PCI. Continue current therapy.

## 2011-02-20 NOTE — Patient Instructions (Signed)
Your physician recommends that you schedule a follow-up appointment in: 1 MONTH WITH DR. Clifton James  Your physician has requested that you have en exercise stress myoview. For further information please visit [email or web address] Please follow instruction sheet, as given.

## 2011-02-20 NOTE — Progress Notes (Signed)
History of Present Illness:55 yo WM with history of tobacco abuse, CAD , HTN and hyperlipidemia admitted to [facility] 09/16/10 with chest pain. He ruled in for a NSTEMI with cardiac enzymes. He underwent diagnostic heart cath on 09/17/10 and was found to have a severe stenosis of the LAD. Dr. Riley Kill placed a drug eluting stent in the LAD. He was also found to have moderate disease in the RCA (70%) and Circumflex (70%) but this was not treated. I saw him in January for hospital follow up and again in April for follow up. He is here today for follow up. He is still smoking. He describes overall fatigue, feeling constantly tired, some dyspnea. He does have mild chest pain, left sided and occurs almost daily. His spouse states that he has no energy to do anything.   Past Medical History  Diagnosis Date  . HYPERLIPIDEMIA-MIXED 10/04/2010  . TOBACCO ABUSE 10/04/2010  . HYPERTENSION, BENIGN 10/04/2010  . CAD, NATIVE VESSEL 10/04/2010  . Alcohol abuse 12/27/2010  . Allergic rhinitis, cause unspecified 12/27/2010    Past Surgical History  Procedure Date  . Finger trauma     almost cut off  . Vasectomy     Current Outpatient Prescriptions  Medication Sig Dispense Refill  . amLODipine (NORVASC) 10 MG tablet Take 1 tablet (10 mg total) by mouth daily.  30 tablet  6  . aspirin 325 MG tablet Take 325 mg by mouth daily.        . carvedilol (COREG) 3.125 MG tablet Take 1 tablet (3.125 mg total) by mouth 2 (two) times daily.  60 tablet  3  . Multiple Vitamin (MULTIVITAMIN) capsule Take 1 capsule by mouth daily.        . nitroGLYCERIN (NITROSTAT) 0.4 MG SL tablet Place 0.4 mg under the tongue. 1 tablet under tongue at onset of chest pain, you may repeat every 5 minutes for up to 3 doses.       . prasugrel (EFFIENT) 10 MG TABS Take 1 tablet (10 mg total) by mouth daily.  90 tablet  3  . rosuvastatin (CRESTOR) 40 MG tablet Take 1 tablet (40 mg total) by mouth daily.  90 tablet  3    Allergies  Allergen  Reactions  . Penicillins     History   Social History  . Marital Status: Single    Spouse Name: N/A    Number of Children: N/A  . Years of Education: N/A   Occupational History  . Electrical maintenance work      [employer]   Social History Main Topics  . Smoking status: Current Everyday Smoker  . Smokeless tobacco: Not on file   Comment: 2 plus packs per day. He has a 100 + pack - year history of tobacco abuse currently. Former 4 ppd for 25 years.  . Alcohol Use: Yes     He drinks a 6 - pack of beer per night  . Drug Use: No  . Sexually Active: Not on file   Other Topics Concern  . Not on file   Social History Narrative   The patient lives in [place] with his girlfriend. 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    No family history on file.  Review of Systems:  As stated in the HPI and otherwise negative.   BP 128/76  Pulse 77  Resp 18  Ht 5\' 11"  (1.803 m)  Wt 165 lb 6.4 oz (75.025 kg)  BMI  23.07 kg/m2  Physical Examination: General: Well developed, well nourished, NAD HEENT: OP clear, mucus membranes moist SKIN: warm, dry. No rashes. Neuro: No focal deficits Musculoskeletal: Muscle strength 5/5 all ext Psychiatric: Mood and affect normal Neck: No JVD, no carotid bruits, no thyromegaly, no lymphadenopathy. Lungs:Clear bilaterally, no wheezes, rhonci, crackles Cardiovascular: Regular rate and rhythm. No murmurs, gallops or rubs. Abdomen:Soft. Bowel sounds present. Non-tender.  Extremities: No lower extremity edema. Pulses are 2 + in the bilateral DP/PT.  Echo 01/09/11: Left ventricle: The cavity size was normal. Systolic function was normal. The estimated ejection fraction was in the range of 55% to 60%. Wall motion was normal; there were no regional wall motion abnormalities.    EKG: NSR, rate 77 bpm.

## 2011-02-28 ENCOUNTER — Ambulatory Visit (HOSPITAL_COMMUNITY): Payer: BC Managed Care – PPO | Attending: Cardiovascular Disease | Admitting: Radiology

## 2011-02-28 DIAGNOSIS — R0602 Shortness of breath: Secondary | ICD-10-CM

## 2011-02-28 DIAGNOSIS — I2510 Atherosclerotic heart disease of native coronary artery without angina pectoris: Secondary | ICD-10-CM | POA: Insufficient documentation

## 2011-02-28 DIAGNOSIS — R079 Chest pain, unspecified: Secondary | ICD-10-CM

## 2011-02-28 MED ORDER — TECHNETIUM TC 99M TETROFOSMIN IV KIT
11.0000 | PACK | Freq: Once | INTRAVENOUS | Status: AC | PRN
  Administered 2011-02-28: 11 via INTRAVENOUS

## 2011-02-28 MED ORDER — TECHNETIUM TC 99M TETROFOSMIN IV KIT
33.0000 | PACK | Freq: Once | INTRAVENOUS | Status: AC | PRN
  Administered 2011-02-28: 33 via INTRAVENOUS

## 2011-02-28 NOTE — Progress Notes (Signed)
[facility] SITE 3 NUCLEAR MED [address] [place] [address] [phone]  Cardiology Nuclear Med Study  John [name] is a 55 y.o. male [id number] [birth year]   Nuclear Med Background Indication for Stress Test:  Evaluation for Ischemia and Stent Patency History: 12/2010 Echo:EF 55-60% NL, 12/11 Heart Catheterization:LAD: stent, RCA: 70%, CFX:70%, and 12/11 Stents: LAD Cardiac Risk Factors: Hypertension, Lipids and Smoker  Symptoms:  Chest Pain, DOE, Fatigue and SOB   Nuclear Pre-Procedure Caffeine/Decaff Intake:  None NPO After: 5:00am   Lungs:  clear IV 0.9% NS with Angio Cath:  20g  IV Site: R Antecubital  IV Started by:  Irean Hong, RN  Chest Size (in):  42 Cup Size: n/a  Height: 5\' 11"  (1.803 m)  Weight:  158 lb (71.668 kg)  BMI:  Body mass index is 22.04 kg/(m^2). Tech Comments:  Held coreg x 24 hrs    Nuclear Med Study 1 or 2 day study: 1 day  Stress Test Type:  Stress  Reading MD: Cassell Clement, MD  Order Authorizing Provider:  C.McAlhany  Resting Radionuclide: Technetium 101m Tetrofosmin  Resting Radionuclide Dose: 11.0 mCi   Stress Radionuclide:  Technetium 52m Tetrofosmin  Stress Radionuclide Dose: 33.0 mCi           Stress Protocol Rest HR: 75 Stress HR: 142  Rest BP: 116/67 Stress BP: 197/83  Exercise Time (min): 6:18 METS: 7.0   Predicted Max HR: 167 bpm % Max HR: 85.03 bpm Rate Pressure Product: 28413   Dose of Adenosine (mg):  n/a Dose of Lexiscan: n/a mg  Dose of Atropine (mg): n/a Dose of Dobutamine: n/a mcg/kg/min (at max HR)  Stress Test Technologist: Milana Na, EMT-P  Nuclear Technologist:  Domenic Polite, CNMT     Rest Procedure:  Myocardial perfusion imaging was performed at rest 45 minutes following the intravenous administration of Technetium 45m Tetrofosmin. Rest ECG: NSR  Stress Procedure:  The patient exercised for 6:18.  The patient stopped due to sob,fatigue,very pale, and denied any chest  pain.  There were no significant ST-T wave changes.  Technetium 31m Tetrofosmin was injected at peak exercise and myocardial perfusion imaging was performed after a brief delay. Stress ECG: No significant change from baseline ECG  QPS Raw Data Images:  Normal; no motion artifact; normal heart/lung ratio. Stress Images:  There is decreased uptake in the septum. Rest Images:  Normal homogeneous uptake in all areas of the myocardium. Subtraction (SDS):  These findings are consistent with ischemia. Transient Ischemic Dilatation (Normal <1.22):  1.09 Lung/Heart Ratio (Normal <0.45):  0.26  Quantitative Gated Spect Images QGS EDV:  94 ml QGS ESV:  34 ml QGS cine images:  NL LV Function; NL Wall Motion QGS EF: 64%  Impression Exercise Capacity:  Fair exercise capacity. BP Response:  Normal blood pressure response. Clinical Symptoms:  There is dyspnea. ECG Impression:  No significant ST segment change suggestive of ischemia. Comparison with Prior Nuclear Study: No images to compare  Overall Impression:  Abnormal stress nuclear study. There is mild reversible ischemia of the mid-anteroseptal and apical myocardium.      Cassell Clement

## 2011-03-01 NOTE — Progress Notes (Signed)
His stress test shows possible ischemia. We need to set him up for a cath. Can we arrange this for Tuesday the 12th in the main cath lab or is he can't do that day, look at Monday May 18th or Tuesday 19th? He will need precath labs as well. Thanks, chris

## 2011-03-01 NOTE — Progress Notes (Addendum)
Copy routed to Dr.  Clifton James.Scarlette Ar

## 2011-03-05 ENCOUNTER — Encounter: Payer: Self-pay | Admitting: Cardiology

## 2011-03-05 NOTE — Progress Notes (Signed)
Left heart cath scheduled for 03/11/11 @ 0900.

## 2011-03-05 NOTE — Progress Notes (Signed)
Patient will call me back to determine which day he wants to schedule the cardiac cath*

## 2011-03-06 ENCOUNTER — Other Ambulatory Visit (INDEPENDENT_AMBULATORY_CARE_PROVIDER_SITE_OTHER): Payer: BC Managed Care – PPO | Admitting: *Deleted

## 2011-03-06 DIAGNOSIS — R0609 Other forms of dyspnea: Secondary | ICD-10-CM

## 2011-03-06 DIAGNOSIS — R0600 Dyspnea, unspecified: Secondary | ICD-10-CM

## 2011-03-06 DIAGNOSIS — Z01810 Encounter for preprocedural cardiovascular examination: Secondary | ICD-10-CM

## 2011-03-06 DIAGNOSIS — I2510 Atherosclerotic heart disease of native coronary artery without angina pectoris: Secondary | ICD-10-CM

## 2011-03-06 DIAGNOSIS — R0989 Other specified symptoms and signs involving the circulatory and respiratory systems: Secondary | ICD-10-CM

## 2011-03-07 LAB — BASIC METABOLIC PANEL
BUN: 13 mg/dL (ref 6–23)
CO2: 28 mEq/L (ref 19–32)
Calcium: 9.6 mg/dL (ref 8.4–10.5)
Chloride: 103 mEq/L (ref 96–112)
Creatinine, Ser: 0.9 mg/dL (ref 0.4–1.5)
GFR: 94.84 mL/min (ref >60.00)
Glucose, Bld: 89 mg/dL (ref 70–99)
Potassium: 4.4 mEq/L (ref 3.5–5.1)
Sodium: 139 mEq/L (ref 135–145)

## 2011-03-07 LAB — CBC WITH DIFFERENTIAL/PLATELET
Basophils Absolute: 0 10*3/uL (ref 0.0–0.1)
Basophils Relative: 0.5 % (ref 0.0–3.0)
Eosinophils Absolute: 0.2 10*3/uL (ref 0.0–0.7)
Eosinophils Relative: 2.3 % (ref 0.0–5.0)
HCT: 41.6 % (ref 39.0–52.0)
Hemoglobin: 14.5 g/dL (ref 13.0–17.0)
Lymphocytes Relative: 23.2 % (ref 12.0–46.0)
Lymphs Abs: 2.1 10*3/uL (ref 0.7–4.0)
MCHC: 34.8 g/dL (ref 30.0–36.0)
MCV: 104.2 fl — ABNORMAL HIGH (ref 78.0–100.0)
Monocytes Absolute: 0.6 10*3/uL (ref 0.1–1.0)
Monocytes Relative: 6.6 % (ref 3.0–12.0)
Neutro Abs: 6.2 10*3/uL (ref 1.4–7.7)
Neutrophils Relative %: 67.4 % (ref 43.0–77.0)
Platelets: 226 10*3/uL (ref 150.0–400.0)
RBC: 3.99 Mil/uL — ABNORMAL LOW (ref 4.22–5.81)
RDW: 13.3 % (ref 11.5–14.6)
WBC: 9.2 10*3/uL (ref 4.5–10.5)

## 2011-03-07 LAB — PROTIME-INR
INR: 1 ratio (ref 0.8–1.0)
Prothrombin Time: 10.9 s (ref 10.2–12.4)

## 2011-03-08 ENCOUNTER — Telehealth: Payer: Self-pay | Admitting: Cardiovascular Disease

## 2011-03-08 NOTE — Telephone Encounter (Signed)
Pt having procedure on 6-18, needs to know what to eat or not eat ,when to stop eating/drinking, meds to stop taking, and if we have anything in writing she can pick up when she gets off at 3p

## 2011-03-08 NOTE — Telephone Encounter (Signed)
PT'S GIRLFRIEND HERE TO PICK UP INSTRUCTIONS  FOR CATH .INSTRUCTIONS GIVEN TO GIRLFRIEND./CY

## 2011-03-11 ENCOUNTER — Ambulatory Visit (HOSPITAL_COMMUNITY)
Admission: RE | Admit: 2011-03-11 | Discharge: 2011-03-11 | Disposition: A | Discharge status: Home / Self Care | Payer: BC Managed Care – PPO | Source: Ambulatory Visit | Attending: Cardiovascular Disease | Admitting: Cardiovascular Disease

## 2011-03-11 DIAGNOSIS — R9439 Abnormal result of other cardiovascular function study: Secondary | ICD-10-CM | POA: Insufficient documentation

## 2011-03-11 DIAGNOSIS — Z9861 Coronary angioplasty status: Secondary | ICD-10-CM | POA: Insufficient documentation

## 2011-03-11 DIAGNOSIS — I2510 Atherosclerotic heart disease of native coronary artery without angina pectoris: Secondary | ICD-10-CM

## 2011-03-11 DIAGNOSIS — F17200 Nicotine dependence, unspecified, uncomplicated: Secondary | ICD-10-CM | POA: Insufficient documentation

## 2011-03-14 NOTE — Cardiovascular Report (Signed)
John [name], John [name]            ACCOUNT NO.:  [id number]  MEDICAL RECORD NO.:  [id number]  LOCATION:  [facility]                         FACILITY:  [facility]  PHYSICIAN:  John [name], MDDATE OF BIRTH:  10-Mar-1956  DATE OF PROCEDURE:  03/11/2011 DATE OF DISCHARGE:  03/11/2011                           CARDIAC CATHETERIZATION   PROCEDURE PERFORMED: 1. Left heart catheterization. 2. Selective coronary angiography. 3. Left ventricular angiogram.  OPERATOR:  John Carrow, MD.  INDICATIONS:  This is a 55 year old Caucasian male with a history of coronary artery disease, who was admitted to [facility] in December 2011 with a non-ST-elevation myocardial infarction.  Cardiac catheterization was performed by Dr. Bonnee Quin, who placed a drug- eluting stent in the mid LAD.  The patient was noted at that time to have moderate disease in the proximal right coronary artery and the distal portion of an obtuse marginal branch.  Dr. Riley Kill elected to treat this medically.  The patient presented to my office for follow-up after his initial hospitalization and complained of some fatigue and some chest discomfort.  I saw him again in follow-up last month and he continued to have some chest discomfort with exertion and fatigue with dyspnea.  A stress Myoview showed possible ischemia in the anterior wall and his apex.  Because of this, we arranged a diagnostic catheterization today mainly to exclude disease in the stented segment LAD and also to reevaluate the moderate disease in the circumflex in the right coronary artery.  DETAILS OF PROCEDURE:  The patient was brought to the main cardiac catheterization laboratory as an outpatient.  Consent was signed for the procedure prior to the case.  The patient was placed supine on the cath table.  An Freida Busman test was performed on the right wrist and was positive. The patient did have a weak pulse in the right radial artery.   However, we attempted access through this vessel.  A 1% lidocaine was used for local anesthesia.  I was unable to cannulate the artery secondary to probable spasm in the artery.  We then obtained access through the right femoral artery.  A 5-French sheath was placed for access.  Standard diagnostic catheters were used to perform selective coronary angiography.  A pigtail catheter was used to perform a left ventricular angiogram.  At this point, the case we elected to perform a fractional flow reserve of the moderate stenosis in the proximal right coronary artery.  The patient was given 5000 units of intravenous heparin.  We then engaged the native right coronary artery with a 5-French JR-4 guiding catheter.  When the ACT was greater than 200, I advanced a pressure wire into the distal right coronary artery.  Intravenous adenosine was then administered.  Fractional flow reserve at baseline was 0.99.  After infusion of intravenous adenosine, the fractional flow reserve was 0.97.  This suggested that the stenosis in the proximal right coronary artery was not flow limiting and not physiologically significant.  Angiographically, I do not feel that the stenosis in the obtuse marginal branch or the right coronary artery was flow limiting.  The patient tolerated the procedure well.  He was taken to the holding area in stable condition.  HEMODYNAMIC FINDINGS:  Central aortic pressure 68/48.  Left ventricular pressure 71/2/7.  ANGIOGRAPHIC FINDINGS: 1. The left anterior descending artery is a large vessel that coursed     to the apex.  There is mild plaque in the proximal vessel.  The mid     vessel has a patent stent.  There is a septal perforating branch     and a small diagonal branch that are jailed by the stent.  Both     these vessels have good flow, however.  The LAD wraps around the     apex and has no flow-limiting stenosis.  The circumflex artery has     mild 40% plaque in the  proximal portion.  There is a small-caliber     obtuse marginal branch that arises early in the vessel and has no     disease.  The second obtuse marginal branch is a moderate-to-large     size bifurcating vessel with a 40%-50% stenosis in the distal     portion of the superior subbranch.  The AV groove circumflex has no     obstructive disease. 2. The right coronary artery is a large dominant vessel with a     discrete 50% stenosis in the proximal vessel followed by a tubular     60% stenosis in the proximal vessel.  There is no other disease in     the vessel.  The proximal segmental disease does not appear to be     flow-limiting angiographically. 3. Left ventricular angiogram was performed in the RAO projection     shows normal left ventricular systolic function with ejection     fraction of 55%-60%.  IMPRESSION: 1. Patent stent in the mid LAD. 2. Moderate nonobstructive disease in the second obtuse marginal     branch and the proximal right coronary artery. 3. Normal fractional flow reserve throughout the right coronary     artery. 4. Normal left ventricular systolic function.  RECOMMENDATIONS:  At this time, I will continue the patient's medical therapy for his coronary artery disease.  I have discussed the case with the patient and his girlfriend and I explained to them that the main purpose of the procedure today was to define whether or not to stent in the left anterior descending artery is patent.  Fractional flow reserve suggested that the stenosis in the right coronary artery is not physiologically significant.  The patient is once again advised to stop smoking.  We will consider a long-acting nitrate in the future. However, given his current hypotension, I do not think this is a good medicine to start today.  He will be discharged home today after his bedrest.     John Carrow, MD     CM/MEDQ  D:  03/11/2011  T:  03/12/2011  Job:   [id number]  Electronically Signed by John Carrow MD on 03/14/2011 09:02:01 AM

## 2011-03-15 ENCOUNTER — Ambulatory Visit: Payer: BC Managed Care – PPO | Admitting: Cardiovascular Disease

## 2011-04-25 ENCOUNTER — Ambulatory Visit: Payer: BC Managed Care – PPO | Admitting: Cardiovascular Disease

## 2011-05-21 ENCOUNTER — Ambulatory Visit (INDEPENDENT_AMBULATORY_CARE_PROVIDER_SITE_OTHER): Payer: BC Managed Care – PPO | Admitting: Cardiovascular Disease

## 2011-05-21 ENCOUNTER — Encounter: Payer: Self-pay | Admitting: Cardiovascular Disease

## 2011-05-21 VITALS — BP 136/72 | HR 86 | Ht 71.0 in | Wt 167.8 lb

## 2011-05-21 DIAGNOSIS — I10 Essential (primary) hypertension: Secondary | ICD-10-CM

## 2011-05-21 DIAGNOSIS — I2510 Atherosclerotic heart disease of native coronary artery without angina pectoris: Secondary | ICD-10-CM

## 2011-05-21 MED ORDER — AMLODIPINE BESYLATE 10 MG PO TABS: 10.0000 mg | ORAL_TABLET | Freq: Every day | ORAL | Status: DC

## 2011-05-21 MED ORDER — ROSUVASTATIN CALCIUM 40 MG PO TABS: 20.0000 mg | ORAL_TABLET | Freq: Every day | ORAL | Status: DC

## 2011-05-21 MED ORDER — PRASUGREL HCL 10 MG PO TABS: 10.0000 mg | ORAL_TABLET | Freq: Every day | ORAL | Status: DC

## 2011-05-21 MED ORDER — CARVEDILOL 3.125 MG PO TABS: ORAL_TABLET | ORAL | Status: DC

## 2011-05-21 NOTE — Progress Notes (Addendum)
History of Present Illness:55 yo WM with history of tobacco abuse, CAD , HTN and hyperlipidemia admitted to [facility] 09/16/10 with chest pain. He ruled in for a NSTEMI with cardiac enzymes. He underwent diagnostic heart cath on 09/17/10 and was found to have a severe stenosis of the LAD. Dr. Riley Kill placed a drug eluting stent in the LAD. He was also found to have moderate disease in the RCA (70%) and Circumflex (70%) but this was not treated. I saw him in January for hospital follow up and again in April for follow up. He came back in May 2012 and described overall fatigue, feeling constantly tired, some dyspnea as well as chest pain, left sided and occurs almost daily. I arranged a repeat cardiac cath on March 12, 2011 which showed patent LAD stent and moderate disease in the RCA and OM2. I flow wired the RCA and it was not flow limiting. FFR was 0. 97.   He is here today for follow up. He tells me that he is feeling better. He has had no chest pain.    Past Medical History  Diagnosis Date  . HYPERLIPIDEMIA-MIXED 10/04/2010  . TOBACCO ABUSE 10/04/2010  . HYPERTENSION, BENIGN 10/04/2010  . CAD, NATIVE VESSEL 10/04/2010  . Alcohol abuse 12/27/2010  . Allergic rhinitis, cause unspecified 12/27/2010    Past Surgical History  Procedure Date  . Finger trauma     almost cut off  . Vasectomy     Current Outpatient Prescriptions  Medication Sig Dispense Refill  . amLODipine (NORVASC) 10 MG tablet Take 1 tablet (10 mg total) by mouth daily.  30 tablet  6  . aspirin 325 MG tablet Take 325 mg by mouth daily.        . carvedilol (COREG) 3.125 MG tablet Take 1 tablet (3.125 mg total) by mouth 2 (two) times daily.  60 tablet  3  . Multiple Vitamin (MULTIVITAMIN) capsule Take 1 capsule by mouth daily.        . nitroGLYCERIN (NITROSTAT) 0.4 MG SL tablet Place 0.4 mg under the tongue. 1 tablet under tongue at onset of chest pain, you may repeat every 5 minutes for up to 3 doses.       . prasugrel  (EFFIENT) 10 MG TABS Take 1 tablet (10 mg total) by mouth daily.  90 tablet  3  . rosuvastatin (CRESTOR) 40 MG tablet Take 20 mg by mouth daily.        . varenicline (CHANTIX CONTINUING MONTH PAK) 1 MG tablet Take 1 tablet (1 mg total) by mouth 2 (two) times daily.  60 tablet  2    Allergies  Allergen Reactions  . Penicillins     History   Social History  . Marital Status: Single    Spouse Name: N/A    Number of Children: N/A  . Years of Education: N/A   Occupational History  . Electrical maintenance work      [employer]   Social History Main Topics  . Smoking status: Current Everyday Smoker  . Smokeless tobacco: Not on file   Comment: 2 plus packs per day. He has a 100 + pack - year history of tobacco abuse currently. Former 4 ppd for 25 years.  . Alcohol Use: Yes     He drinks a 6 - pack of beer per night  . Drug Use: No  . Sexually Active: Not on file   Other Topics Concern  . Not on file   Social History  Narrative   The patient lives in [place] with his girlfriend. 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    No family history on file.  Review of Systems:  As stated in the HPI and otherwise negative.   BP 136/72  Pulse 86  Ht 5\' 11"  (1.803 m)  Wt 167 lb 12.8 oz (76.114 kg)  BMI 23.40 kg/m2  Physical Examination: General: Well developed, well nourished, NAD HEENT: OP clear, mucus membranes moist SKIN: warm, dry. No rashes. Neuro: No focal deficits Musculoskeletal: Muscle strength 5/5 all ext Psychiatric: Mood and affect normal Neck: No JVD, no carotid bruits, no thyromegaly, no lymphadenopathy. Lungs:Clear bilaterally, no wheezes, rhonci, crackles Cardiovascular: Regular rate and rhythm. No murmurs, gallops or rubs. Abdomen:Soft. Bowel sounds present. Non-tender.  Extremities: No lower extremity edema. Pulses are 2 + in the bilateral DP/PT.  EKG:  Cardiac Cath 03/12/11:  HEMODYNAMIC FINDINGS:  Central aortic pressure 68/48.   Left ventricular   pressure 71/2/7.      ANGIOGRAPHIC FINDINGS:   1. The left anterior descending artery is a large vessel that coursed       to the apex.  There is mild plaque in the proximal vessel.  The mid       vessel has a patent stent.  There is a septal perforating branch       and a small diagonal branch that are jailed by the stent.  Both       these vessels have good flow, however.  The LAD wraps around the       apex and has no flow-limiting stenosis.  The circumflex artery has       mild 40% plaque in the proximal portion.  There is a small-caliber       obtuse marginal branch that arises early in the vessel and has no       disease.  The second obtuse marginal branch is a moderate-to-large       size bifurcating vessel with a 40%-50% stenosis in the distal       portion of the superior subbranch.  The AV groove circumflex has no       obstructive disease.   2. The right coronary artery is a large dominant vessel with a       discrete 50% stenosis in the proximal vessel followed by a tubular       60% stenosis in the proximal vessel.  There is no other disease in       the vessel.  The proximal segmental disease does not appear to be       flow-limiting angiographically.   3. Left ventricular angiogram was performed in the RAO projection       shows normal left ventricular systolic function with ejection       fraction of 55%-60%.      IMPRESSION:   1. Patent stent in the mid LAD.   2. Moderate nonobstructive disease in the second obtuse marginal       branch and the proximal right coronary artery.   3. Normal fractional flow reserve throughout the right coronary       artery.   4. Normal left ventricular systolic function.   A/P:  1. CAD: Stable. Will continue current meds including ASA, Crestor, Effient and Norvasc. Smoking cessation encouraged again.

## 2011-05-21 NOTE — Patient Instructions (Signed)
Your physician recommends that you schedule a follow-up appointment in: 6 months  

## 2011-05-24 ENCOUNTER — Telehealth: Payer: Self-pay | Admitting: Cardiovascular Disease

## 2011-05-24 MED ORDER — ROSUVASTATIN CALCIUM 40 MG PO TABS: 20.0000 mg | ORAL_TABLET | Freq: Every day | ORAL | Status: DC

## 2011-05-24 NOTE — Telephone Encounter (Signed)
crestor was called into [facility] incorrectly, per pt's wife, [facility] would not tell her what was incorrect, just that she needed to call us to get it corrected

## 2011-06-11 ENCOUNTER — Other Ambulatory Visit: Payer: Self-pay | Admitting: Cardiovascular Disease

## 2011-06-11 NOTE — Telephone Encounter (Signed)
Pharmacy, [facility], stating that they cannot read the RX. Please call in RX for Crestor.

## 2011-06-12 MED ORDER — ROSUVASTATIN CALCIUM 40 MG PO TABS: 20.0000 mg | ORAL_TABLET | Freq: Every day | ORAL | Status: DC

## 2011-11-13 ENCOUNTER — Telehealth: Payer: Self-pay | Admitting: Cardiovascular Disease

## 2011-11-13 ENCOUNTER — Ambulatory Visit (INDEPENDENT_AMBULATORY_CARE_PROVIDER_SITE_OTHER): Payer: BC Managed Care – PPO | Admitting: Cardiovascular Disease

## 2011-11-13 ENCOUNTER — Encounter: Payer: Self-pay | Admitting: Cardiovascular Disease

## 2011-11-13 VITALS — BP 116/76 | HR 77 | Ht 71.0 in | Wt 173.0 lb

## 2011-11-13 DIAGNOSIS — I2510 Atherosclerotic heart disease of native coronary artery without angina pectoris: Secondary | ICD-10-CM

## 2011-11-13 MED ORDER — NITROGLYCERIN 0.4 MG SL SUBL: 0.4000 mg | SUBLINGUAL_TABLET | SUBLINGUAL | Status: DC | PRN

## 2011-11-13 MED ORDER — CLOPIDOGREL BISULFATE 75 MG PO TABS: 75.0000 mg | ORAL_TABLET | Freq: Every day | ORAL | Status: DC

## 2011-11-13 NOTE — Progress Notes (Signed)
History of Present Illness: 56 yo WM with history of tobacco abuse, CAD , HTN and hyperlipidemia admitted to [facility] 09/16/10 with chest pain. He ruled in for a NSTEMI with cardiac enzymes. He underwent diagnostic heart cath on 09/17/10 and was found to have a severe stenosis of the LAD. Dr. Riley Kill placed a drug eluting stent in the LAD. He was also found to have moderate disease in the RCA (70%) and Circumflex (70%) but this was not treated. I saw him in January for hospital follow up and again in April for follow up. He came back in May 2012 and described overall fatigue, feeling constantly tired, some dyspnea as well as chest pain, left sided and occurs almost daily. I arranged a repeat cardiac cath on March 12, 2011 which showed patent LAD stent and moderate disease in the RCA and OM2. I flow wired the RCA and it was not flow limiting. FFR was 0. 97.   He is here today for follow up. He tells me that he is feeling better. He has had no chest pain. No trouble breathing. 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    Primary Care Physician:  Dr. Oliver Barre  Last Lipid Profile:  Past Medical History  Diagnosis Date  . HYPERLIPIDEMIA-MIXED 10/04/2010  . TOBACCO ABUSE 10/04/2010  . HYPERTENSION, BENIGN 10/04/2010  . CAD, NATIVE VESSEL 10/04/2010  . Alcohol abuse 12/27/2010  . Allergic rhinitis, cause unspecified 12/27/2010    Past Surgical History  Procedure Date  . Finger trauma     almost cut off  . Vasectomy     Current Outpatient Prescriptions  Medication Sig Dispense Refill  . amLODipine (NORVASC) 10 MG tablet Take 1 tablet (10 mg total) by mouth daily.  30 tablet  8  . aspirin 325 MG tablet Take 325 mg by mouth daily.        . carvedilol (COREG) 3.125 MG tablet Take 1/2 tablet by mouth twice daily.  60 tablet  8  . Multiple Vitamin (MULTIVITAMIN) capsule Take 1 capsule by mouth daily.        . nitroGLYCERIN (NITROSTAT) 0.4 MG SL tablet Place 0.4 mg under the  tongue. 1 tablet under tongue at onset of chest pain, you may repeat every 5 minutes for up to 3 doses.       . prasugrel (EFFIENT) 10 MG TABS Take 1 tablet (10 mg total) by mouth daily.  90 tablet  3  . rosuvastatin (CRESTOR) 40 MG tablet Take 0.5 tablets (20 mg total) by mouth daily.  45 tablet  3    Allergies  Allergen Reactions  . Penicillins     History   Social History  . Marital Status: Single    Spouse Name: N/A    Number of Children: N/A  . Years of Education: N/A   Occupational History  . Electrical maintenance work      [employer]   Social History Main Topics  . Smoking status: Current Everyday Smoker  . Smokeless tobacco: Not on file   Comment: 2 plus packs per day. He has a 100 + pack - year history of tobacco abuse currently. Former 4 ppd for 25 years.  . Alcohol Use: Yes     He drinks a 6 - pack of beer per night  . Drug Use: No  . Sexually Active: Not on file   Other Topics Concern  . Not on file   Social History Narrative   The  patient lives in [place] with his girlfriend. 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    No family history on file.  Review of Systems:  As stated in the HPI and otherwise negative.   BP 116/76  Pulse 77  Ht 5\' 11"  (1.803 m)  Wt 173 lb (78.472 kg)  BMI 24.13 kg/m2  Physical Examination: General: Well developed, well nourished, NAD HEENT: OP clear, mucus membranes moist SKIN: warm, dry. No rashes. Neuro: No focal deficits Musculoskeletal: Muscle strength 5/5 all ext Psychiatric: Mood and affect normal Neck: No JVD, no carotid bruits, no thyromegaly, no lymphadenopathy. Lungs:Clear bilaterally, no wheezes, rhonci, crackles Cardiovascular: Regular rate and rhythm. No murmurs, gallops or rubs. Abdomen:Soft. Bowel sounds present. Non-tender.  Extremities: No lower extremity edema. Pulses are 2 + in the bilateral DP/PT.

## 2011-11-13 NOTE — Telephone Encounter (Signed)
Spoke with Lucrezia Europe who requests Clopidogrel be sent to [facility].  Will send.

## 2011-11-13 NOTE — Assessment & Plan Note (Signed)
BP well controlled. No changes.  

## 2011-11-13 NOTE — Telephone Encounter (Signed)
New Problem   Patient Friend John [name] [phone]  Request the RX from todays appnt be sent to [facility], as it costs too much at the local pharmacy

## 2011-11-13 NOTE — Patient Instructions (Signed)
Your physician wants you to follow-up in: 6 months. You will receive a reminder letter in the mail two months in advance. If you don't receive a letter, please call our office to schedule the follow-up appointment.  Your physician has recommended you make the following change in your medication: Stop Effient.  Start Clopidogrel 75 mg by mouth daily.   Your physician recommends that you return for fasting lab work in: next few weeks--Lipid and Liver profile.

## 2011-11-13 NOTE — Assessment & Plan Note (Signed)
Stable. Will d/c Effient and start Plavix 75 mg po QDaily. Will continue ASA, beta blocker and statiln. He will need repeat lipids and LFTs next week.

## 2011-11-13 NOTE — Assessment & Plan Note (Signed)
Smoking cessation encouraged!

## 2011-11-19 ENCOUNTER — Other Ambulatory Visit: Payer: Self-pay | Admitting: *Deleted

## 2011-11-26 ENCOUNTER — Other Ambulatory Visit (INDEPENDENT_AMBULATORY_CARE_PROVIDER_SITE_OTHER): Payer: BC Managed Care – PPO

## 2011-11-26 DIAGNOSIS — I2510 Atherosclerotic heart disease of native coronary artery without angina pectoris: Secondary | ICD-10-CM

## 2011-11-26 LAB — LIPID PANEL
Cholesterol: 190 mg/dL (ref 0–200)
Triglycerides: 110 mg/dL (ref 0.0–149.0)

## 2011-11-26 LAB — HEPATIC FUNCTION PANEL
ALT: 28 U/L (ref 0–53)
Albumin: 4.2 g/dL (ref 3.5–5.2)
Total Protein: 7 g/dL (ref 6.0–8.3)

## 2011-12-02 ENCOUNTER — Telehealth: Payer: Self-pay | Admitting: Cardiovascular Disease

## 2011-12-02 DIAGNOSIS — E782 Mixed hyperlipidemia: Secondary | ICD-10-CM

## 2011-12-02 MED ORDER — ROSUVASTATIN CALCIUM 40 MG PO TABS: ORAL_TABLET | ORAL | Status: DC

## 2011-12-02 NOTE — Telephone Encounter (Signed)
I spoke with the patient's girlfriend about the message he received regarding increasing Crestor. She states they were told to stop crestor when it ran out and start plavix. I explained to the patient's girlfriend that he was to stop effient and start plavix. He was to continue Crestor. I asked if he received a patient instruction sheet at his office visit. She states they did and she pulled this out. She read the instructions and stated she had not read them until now. She was only going by the prescriptions they had been given. He has been taking both Effient and Plavix. I have instructed her to have him stop Effient now, continue plavix, and we will resume Crestor at 40 mg once daily as he has only been off of this about 3 weeks. We will recheck a lipid and liver panel in about 8 weeks. I will notify [facility] of the changes.

## 2011-12-02 NOTE — Telephone Encounter (Signed)
I called [facility] per the patient's girlfriend's request to stop further refills on effient. I also asked Customer Service to call her per her request about shipping his Effient back. They do not schedule calls for this and they do not take medications back. I have notified the patient's girlfriend.

## 2011-12-02 NOTE — Telephone Encounter (Signed)
Pt's girlfriend calling re voicemail from [place] on friday re labs, pt was told to increase crestor to 40 mg and pt thought he was to stop crestor and take plavix 75mg , calling to confirm

## 2011-12-03 ENCOUNTER — Other Ambulatory Visit: Payer: Self-pay | Admitting: *Deleted

## 2011-12-03 DIAGNOSIS — E782 Mixed hyperlipidemia: Secondary | ICD-10-CM

## 2011-12-03 MED ORDER — ROSUVASTATIN CALCIUM 40 MG PO TABS: ORAL_TABLET | ORAL | Status: DC

## 2011-12-10 ENCOUNTER — Telehealth: Payer: Self-pay | Admitting: Cardiovascular Disease

## 2011-12-10 NOTE — Telephone Encounter (Signed)
Dr. Clifton James completed form indicating pt did not need to have testing related to clopidogrel offered by [facility] done.  I spoke with [facility] rep and gave him this information

## 2011-12-10 NOTE — Telephone Encounter (Signed)
New Msg: Pharmacy calling wanting to make sure fax was received regarding pt clopidogrel RX. Please return call to discuss further.   When calling back please select Option 1 Reference # [id number]

## 2012-02-03 ENCOUNTER — Other Ambulatory Visit: Payer: BC Managed Care – PPO

## 2012-02-07 ENCOUNTER — Other Ambulatory Visit (INDEPENDENT_AMBULATORY_CARE_PROVIDER_SITE_OTHER): Payer: BC Managed Care – PPO

## 2012-02-07 DIAGNOSIS — E782 Mixed hyperlipidemia: Secondary | ICD-10-CM

## 2012-02-07 LAB — HEPATIC FUNCTION PANEL
ALT: 26 U/L (ref 0–53)
AST: 30 U/L (ref 0–37)
Alkaline Phosphatase: 105 U/L (ref 39–117)
Total Bilirubin: 0.5 mg/dL (ref 0.3–1.2)

## 2012-02-07 LAB — LIPID PANEL
HDL: 53.4 mg/dL (ref >39.00)
Triglycerides: 111 mg/dL (ref 0.0–149.0)

## 2012-02-13 ENCOUNTER — Other Ambulatory Visit: Payer: Self-pay | Admitting: *Deleted

## 2012-02-13 DIAGNOSIS — I2510 Atherosclerotic heart disease of native coronary artery without angina pectoris: Secondary | ICD-10-CM

## 2012-02-13 MED ORDER — AMLODIPINE BESYLATE 10 MG PO TABS: 10.0000 mg | ORAL_TABLET | Freq: Every day | ORAL | Status: DC

## 2012-02-13 NOTE — Telephone Encounter (Signed)
Pt's girlfriend here(walk in) to find out why pharmacy won't fill RX for amlodipine--advised RX just ran out and i will refill

## 2012-02-13 NOTE — Telephone Encounter (Signed)
RX for amlodipine electronically sent to [facility] on [address]

## 2012-02-27 ENCOUNTER — Telehealth: Payer: Self-pay | Admitting: Cardiovascular Disease

## 2012-02-27 NOTE — Telephone Encounter (Signed)
Mri lab called wanting to know if he can have a mri, last stent placed 2011, per dr cooper/ ok.

## 2012-02-27 NOTE — Telephone Encounter (Signed)
New Problem:     Patient's wife walked in because her and her husband need two stent cards form when he had his stents placed back in 2011.  She said that the cards can be mailed to the patient's home address.  Please call back.

## 2012-02-27 NOTE — Telephone Encounter (Signed)
WILL FORWARD TO NURSE/DR

## 2012-02-27 NOTE — Telephone Encounter (Signed)
We are not able to give stent cards two years later. We can tell him what size they were. John [name]

## 2012-03-03 NOTE — Telephone Encounter (Signed)
I called and spoke with pt's girlfriend who states she has stent cards that were given to her by office

## 2012-03-23 DIAGNOSIS — Z9289 Personal history of other medical treatment: Secondary | ICD-10-CM

## 2012-03-23 HISTORY — PX: NASAL ENDOSCOPY WITH EPISTAXIS CONTROL: SHX5664

## 2012-03-23 HISTORY — DX: Personal history of other medical treatment: Z92.89

## 2012-03-28 ENCOUNTER — Inpatient Hospital Stay (HOSPITAL_COMMUNITY)
Admission: EM | Admit: 2012-03-28 | Discharge: 2012-03-30 | DRG: 586 | Disposition: A | Discharge status: Home / Self Care | Payer: BC Managed Care – PPO | Attending: Otolaryngology | Admitting: Otolaryngology

## 2012-03-28 ENCOUNTER — Emergency Department (HOSPITAL_COMMUNITY)
Admission: EM | Admit: 2012-03-28 | Discharge: 2012-03-28 | Disposition: A | Discharge status: Home / Self Care | Payer: BC Managed Care – PPO | Attending: Emergency Medicine | Admitting: Emergency Medicine

## 2012-03-28 ENCOUNTER — Encounter (HOSPITAL_COMMUNITY): Payer: Self-pay | Admitting: *Deleted

## 2012-03-28 DIAGNOSIS — I10 Essential (primary) hypertension: Secondary | ICD-10-CM | POA: Diagnosis present

## 2012-03-28 DIAGNOSIS — R578 Other shock: Secondary | ICD-10-CM | POA: Diagnosis present

## 2012-03-28 DIAGNOSIS — R040 Epistaxis: Secondary | ICD-10-CM | POA: Insufficient documentation

## 2012-03-28 DIAGNOSIS — J309 Allergic rhinitis, unspecified: Secondary | ICD-10-CM | POA: Diagnosis present

## 2012-03-28 DIAGNOSIS — I2510 Atherosclerotic heart disease of native coronary artery without angina pectoris: Secondary | ICD-10-CM | POA: Insufficient documentation

## 2012-03-28 DIAGNOSIS — Z7982 Long term (current) use of aspirin: Secondary | ICD-10-CM | POA: Insufficient documentation

## 2012-03-28 DIAGNOSIS — R0902 Hypoxemia: Secondary | ICD-10-CM | POA: Diagnosis present

## 2012-03-28 DIAGNOSIS — Z7902 Long term (current) use of antithrombotics/antiplatelets: Secondary | ICD-10-CM | POA: Insufficient documentation

## 2012-03-28 DIAGNOSIS — E785 Hyperlipidemia, unspecified: Secondary | ICD-10-CM | POA: Insufficient documentation

## 2012-03-28 DIAGNOSIS — R0600 Dyspnea, unspecified: Secondary | ICD-10-CM

## 2012-03-28 DIAGNOSIS — F17200 Nicotine dependence, unspecified, uncomplicated: Secondary | ICD-10-CM | POA: Diagnosis present

## 2012-03-28 DIAGNOSIS — F1010 Alcohol abuse, uncomplicated: Secondary | ICD-10-CM | POA: Diagnosis present

## 2012-03-28 DIAGNOSIS — E782 Mixed hyperlipidemia: Secondary | ICD-10-CM

## 2012-03-28 DIAGNOSIS — Z880 Allergy status to penicillin: Secondary | ICD-10-CM | POA: Insufficient documentation

## 2012-03-28 DIAGNOSIS — Z79899 Other long term (current) drug therapy: Secondary | ICD-10-CM

## 2012-03-28 LAB — CBC WITH DIFFERENTIAL/PLATELET
Basophils Absolute: 0.1 10*3/uL (ref 0.0–0.1)
Lymphocytes Relative: 23 % (ref 12–46)
Lymphs Abs: 2.3 10*3/uL (ref 0.7–4.0)
Neutro Abs: 6.5 10*3/uL (ref 1.7–7.7)
Neutrophils Relative %: 65 % (ref 43–77)
Platelets: 218 10*3/uL (ref 150–400)
RBC: 3.7 MIL/uL — ABNORMAL LOW (ref 4.22–5.81)
RDW: 12.4 % (ref 11.5–15.5)
WBC: 9.9 10*3/uL (ref 4.0–10.5)

## 2012-03-28 LAB — CBC
MCHC: 34.9 g/dL (ref 30.0–36.0)
MCV: 100 fL (ref 78.0–100.0)
Platelets: 198 10*3/uL (ref 150–400)
RDW: 12.5 % (ref 11.5–15.5)
WBC: 9.7 10*3/uL (ref 4.0–10.5)

## 2012-03-28 LAB — POCT I-STAT, CHEM 8
BUN: 25 mg/dL — ABNORMAL HIGH (ref 6–23)
Calcium, Ion: 1.17 mmol/L (ref 1.12–1.23)
Hemoglobin: 10.2 g/dL — ABNORMAL LOW (ref 13.0–17.0)
TCO2: 28 mmol/L (ref 0–100)

## 2012-03-28 LAB — SAMPLE TO BLOOD BANK

## 2012-03-28 MED ORDER — ONDANSETRON HCL 4 MG/2ML IJ SOLN
INTRAMUSCULAR | Status: AC
  Administered 2012-03-28: 8 mg
  Filled 2012-03-28: qty 4

## 2012-03-28 MED ORDER — CLINDAMYCIN HCL 150 MG PO CAPS: 300.0000 mg | ORAL_CAPSULE | Freq: Four times a day (QID) | ORAL | Status: DC

## 2012-03-28 MED ORDER — SODIUM CHLORIDE 0.9 % IV BOLUS (SEPSIS)
1000.0000 mL | Freq: Once | INTRAVENOUS | Status: AC
  Administered 2012-03-28: 1000 mL via INTRAVENOUS

## 2012-03-28 MED ORDER — PROMETHAZINE HCL 25 MG/ML IJ SOLN
12.5000 mg | Freq: Once | INTRAMUSCULAR | Status: AC
  Administered 2012-03-28: 12.5 mg via INTRAVENOUS
  Filled 2012-03-28: qty 1

## 2012-03-28 MED ORDER — OXYCODONE-ACETAMINOPHEN 5-325 MG PO TABS: 2.0000 | ORAL_TABLET | ORAL | Status: AC | PRN

## 2012-03-28 MED ORDER — METOCLOPRAMIDE HCL 5 MG/ML IJ SOLN
INTRAMUSCULAR | Status: AC
  Administered 2012-03-28: 10 mg
  Filled 2012-03-28: qty 2

## 2012-03-28 MED ORDER — CLINDAMYCIN HCL 300 MG PO CAPS
300.0000 mg | ORAL_CAPSULE | Freq: Once | ORAL | Status: AC
  Administered 2012-03-28: 300 mg via ORAL
  Filled 2012-03-28: qty 1

## 2012-03-28 MED ORDER — ONDANSETRON HCL 4 MG/2ML IJ SOLN: 4.0000 mg | Freq: Once | INTRAMUSCULAR | Status: DC

## 2012-03-28 MED ORDER — OXYCODONE-ACETAMINOPHEN 5-325 MG PO TABS
2.0000 | ORAL_TABLET | Freq: Once | ORAL | Status: AC
  Administered 2012-03-28: 2 via ORAL
  Filled 2012-03-28 (×2): qty 2

## 2012-03-28 NOTE — ED Notes (Signed)
Pt medicated. Discharge instructions explained to patient and family. Had no further questions. To follow up with ENT x4 days. Pt discharged home with packing to R nostril.

## 2012-03-28 NOTE — ED Provider Notes (Signed)
History     CSN: [id number]  Arrival date & time 03/28/12  1929   First MD Initiated Contact with Patient 03/28/12 2121      Chief Complaint  Patient presents with  . Epistaxis    (Consider location/radiation/quality/duration/timing/severity/associated sxs/prior treatment) HPI Patient is a 56 year old male who presents today complaining of ongoing epistaxis. Patient was seen at around 4 AM this morning for same. At that time patient failed placement of a Rhino Rocket in the emergency department. Dr. Suszanne Conners from ENT saw the patient in place posterior packing on the right naris. Patient is on Plavix and aspirin. He has not taken his Plavix today. Patient was discharged on clindamycin as well. This afternoon the patient took a shower and began having epistaxis around his packing. Patient denies any chest pain, shortness of breath, or lightheadedness. His family presents with a solo cup filled with blood from this. They report that the patient's been spitting out blood and he reports this all has been from blood going down the back of his throat. While in triage patient had a CBC performed. Hemoglobin has dropped from 15 this morning at 6 AM to 12.9. Patient does not have active bleeding appreciated in the posterior oropharynx on my exam. Family is specifically requesting that packing only be manipulated by ENT. Patient also was sent to be tachycardic to 110 with BP of 93/60 upon arrival. Patient is satting 99% on air and is alert and oriented.. Past Medical History  Diagnosis Date  . HYPERLIPIDEMIA-MIXED 10/04/2010  . TOBACCO ABUSE 10/04/2010  . HYPERTENSION, BENIGN 10/04/2010  . CAD, NATIVE VESSEL 10/04/2010  . Alcohol abuse 12/27/2010  . Allergic rhinitis, cause unspecified 12/27/2010    Past Surgical History  Procedure Date  . Finger trauma     almost cut off  . Vasectomy     No family history on file.  History  Substance Use Topics  . Smoking status: Current Everyday Smoker  . Smokeless  tobacco: Not on file   Comment: 2 plus packs per day. He has a 100 + pack - year history of tobacco abuse currently. Former 4 ppd for 25 years.  . Alcohol Use: Yes     He drinks a 6 - pack of beer per night      Review of Systems  Constitutional: Negative.   HENT: Positive for nosebleeds.   Respiratory: Negative.   Cardiovascular: Negative.   Gastrointestinal: Negative.   Genitourinary: Negative.   Musculoskeletal: Negative.   Skin: Negative.   Neurological: Negative.   Hematological: Bruises/bleeds easily.  Psychiatric/Behavioral: Negative.   All other systems reviewed and are negative.    Allergies  Penicillins  Home Medications   Current Outpatient Rx  Name Route Sig Dispense Refill  . AMLODIPINE BESYLATE 10 MG PO TABS Oral Take 1 tablet (10 mg total) by mouth daily. 30 tablet 8  . ASPIRIN 325 MG PO TABS Oral Take 325 mg by mouth daily.      Marland Kitchen CARVEDILOL 3.125 MG PO TABS  Take 1/2 tablet by mouth twice daily. 60 tablet 8  . CLINDAMYCIN HCL 150 MG PO CAPS Oral Take 2 capsules (300 mg total) by mouth every 6 (six) hours. 56 capsule 0  . CLOPIDOGREL BISULFATE 75 MG PO TABS Oral Take 1 tablet (75 mg total) by mouth daily. 90 tablet 3  . MULTIVITAMINS PO CAPS Oral Take 1 capsule by mouth daily.      . OXYCODONE-ACETAMINOPHEN 5-325 MG PO TABS Oral Take 2 tablets by  mouth every 4 (four) hours as needed for pain. 20 tablet 0  . ROSUVASTATIN CALCIUM 40 MG PO TABS  Take one tablet by mouth once daily 90 tablet 3  . NITROGLYCERIN 0.4 MG SL SUBL Sublingual Place 1 tablet (0.4 mg total) under the tongue every 5 (five) minutes as needed for chest pain. 1 tablet under tongue at onset of chest pain, you may repeat every 5 minutes for up to 3 doses. 25 tablet 6    BP 93/60  Pulse 110  Temp 97.6 F (36.4 C) (Oral)  Resp 16  SpO2 99%  Physical Exam  Nursing note and vitals reviewed. GEN: Well-developed, well-nourished male in no distress HEENT: Atraumatic, normocephalic. Patient  with posterior packing present in the right near.  Active bleeding appreciated in the posterior oropharynx  EYES: PERRLA BL, no scleral icterus. NECK: Trachea midline, no meningismus CV: regular rate and rhythm. No murmurs, rubs, or gallops PULM: No respiratory distress.  No crackles, wheezes, or rales. GI: soft, non-tender. No guarding, rebound, or tenderness. + bowel sounds  GU: deferred Neuro: cranial nerves grossly 2-12 intact, no abnormalities of strength or sensation, A and O x 3 MSK: Patient moves all 4 extremities symmetrically, no deformity, edema, or injury noted Skin: No rashes petechiae, purpura, or jaundice. Patient is quite pale. Psych: no abnormality of mood   ED Course  Procedures (including critical care time)  CRITICAL CARE Performed by: Cyndra Numbers   Total critical care time: 60 minutes  Critical care time was exclusive of separately billable procedures and treating other patients.  Critical care was necessary to treat or prevent imminent or life-threatening deterioration.  Critical care was time spent personally by me on the following activities: development of treatment plan with patient and/or surrogate as well as nursing, discussions with consultants, evaluation of patient's response to treatment, examination of patient, obtaining history from patient or surrogate, ordering and performing treatments and interventions, ordering and review of laboratory studies, ordering and review of radiographic studies, pulse oximetry and re-evaluation of patient's condition.   Indication: anemia Please note this EKG was reviewed extemporaneously by myself.   Date: 03/28/2012  Rate: 113  Rhythm: sinus tachycardia  QRS Axis: normal  Intervals: normal  ST/T Wave abnormalities: ST depressions diffusely  Conduction Disutrbances:none  Narrative Interpretation:   Old EKG Reviewed: changes noted      Labs Reviewed  CBC WITH DIFFERENTIAL - Abnormal; Notable for the  following:    RBC 3.70 (*)     Hemoglobin 12.9 (*)     HCT 36.5 (*)     MCH 34.9 (*)     All other components within normal limits  POCT I-STAT, CHEM 8 - Abnormal; Notable for the following:    BUN 25 (*)     Glucose, Bld 110 (*)     Hemoglobin 10.2 (*)     HCT 30.0 (*)     All other components within normal limits  TYPE AND SCREEN  TYPE AND SCREEN  ABO/RH  POCT I-STAT TROPONIN I  PREPARE RBC (CROSSMATCH)  PREPARE PLATELET PHERESIS   No results found.   1. Epistaxis       MDM  Patient was evaluated by myself. A triage patient had had a repeat CBC that showed a drop from 15 and exam this morning to 12.9 at 8 PM tonight. Patient denied any chest pain or shortness of breath. He did apparently have some lightheadedness. He was tachycardic with blood pressure in the 90s systolic. Patient  continues to spit out blood from posterior drainage. Packing is in place. Patient began rebleeding at 6 PM this evening. He does have a history of coronary artery disease but reports that he has no chest pain that's similar to prior episodes of this. Patient did have several episodes of emesis that was both dark and bright red. Patient had repeat i-STAT which showed that hemoglobin had dropped to 10.2. Patient continued to have no chest pain or shortness of breath. Troponin as well as type and screen and EKG were performed. EKG showed some diffuse ST segment depression. I did speak with cardiology at family's request given patient's past medical history. Additionally patient was presented critical care who accepted the patient for admission following OR management with Dr. Suszanne Conners from ENT.  He did see the patient here in the ER.  He did present a second time to evaluate the patient when repeated episodes of emesis occurred with bleeding. Patient was admitted to the OR plan to discharge to ICU following procedure. After discussion with critical care orders were placed for transfusion of platelets and RBCs here in  the emergency department as patient continued to have bleeding.  Patient was transferred up to the OR.   Cyndra Numbers, MD 03/29/12 [id number]

## 2012-03-28 NOTE — ED Notes (Signed)
Pt seen this morning at [facility] for epitaxis. Dr. Suszanne Conners packed his right nare. Pt states he has filled 3 red solo cups with bloody emesis.

## 2012-03-28 NOTE — ED Notes (Signed)
Pt given chux and basin to spit in. Pt reports nausea from swallowing blood. Pt asked to continue to apply pressure.

## 2012-03-28 NOTE — ED Notes (Signed)
The pt does not want the zofran ordered.  His nose bleed has stopped after the ent placed a posterior pack in his rt nostril.  Family at the bedside

## 2012-03-28 NOTE — Progress Notes (Signed)
H&P Update  Pt returns to [facility] ER this evening complaining of repeat epistaxis. At the time of arrival, the patient was bleeding from both nostrils. He was noted to be tachycardic (110) and hypotensive (93/60). His oxygen saturation was still good at 99%. Bleeding was noted from both nostrils and the nasopharynx. In light of the above findings, the decision was made for patient to undergo bilateral control of nasal hemorrhage under endoscopic guidance in the operating room. The risks, benefits, and details of the procedure are reviewed with the patient and his wife. Questions are invited and answered. Informed consent is obtained.

## 2012-03-28 NOTE — ED Notes (Signed)
The pts nose has been  Packed by the edp but  His nose continues to bleed down the back of his throat

## 2012-03-28 NOTE — ED Notes (Signed)
Pt reports around 3am right nare started bleeding, actively bleeding at this time. Pt is on plavix.

## 2012-03-28 NOTE — Consult Note (Signed)
Reason for Consult: Right posterior epistaxis Referring Physician: Olivia Mackie, MD  HPI:  John [name] is an 56 y.o. male who presents with his wife to the emergency room this morning complaining of severe right-sided epistaxis. Patient reports he woke up at 3 AM with bleeding from his right nasal cavity. They've tried cold compresses without improvement. Patient is on Plavix and aspirin, but no other blood thinning medications. Wife reports patient had nosebleed earlier in the month that resolved spontaneously with cold compresses. He denies previous use of cocaine. He has no previous history of ENT surgery.    Past Medical History  Diagnosis Date  . HYPERLIPIDEMIA-MIXED 10/04/2010  . TOBACCO ABUSE 10/04/2010  . HYPERTENSION, BENIGN 10/04/2010  . CAD, NATIVE VESSEL 10/04/2010  . Alcohol abuse 12/27/2010  . Allergic rhinitis, cause unspecified 12/27/2010    Past Surgical History  Procedure Date  . Finger trauma     almost cut off  . Vasectomy   Cardiac stent to treat AMI.  History reviewed. No pertinent family history.  Social History:  reports that he has been smoking.  He does not have any smokeless tobacco history on file. He reports that he drinks alcohol. He reports that he does not use illicit drugs.  Allergies:  Allergies  Allergen Reactions  . Penicillins Other (See Comments)    unknown    Medications: I have reviewed the patient's current medications. .  AMLODIPINE BESYLATE 10 MG PO TABS  Oral  Take 1 tablet (10 mg total) by mouth daily.  30 tablet  8  .  ASPIRIN 325 MG PO TABS  Oral  Take 325 mg by mouth daily.  Marland Kitchen  CARVEDILOL 3.125 MG PO TABS  Take 1/2 tablet by mouth twice daily.  60 tablet  8  .  CLOPIDOGREL BISULFATE 75 MG PO TABS  Oral  Take 1 tablet (75 mg total) by mouth daily.  90 tablet  3  .  MULTIVITAMINS PO CAPS  Oral  Take 1 capsule by mouth daily.  Marland Kitchen  NITROGLYCERIN 0.4 MG SL SUBL  Sublingual  Place 1 tablet (0.4 mg total) under the  tongue every 5 (five) minutes as needed for chest pain. 1 tablet under tongue at onset of chest pain, you may repeat every 5 minutes for up to 3 doses.  25 tablet  6  .  ROSUVASTATIN CALCIUM 40 MG PO TABS  Take one tablet by mouth once daily  90 tablet  3   Review of Systems  All other systems reviewed and are negative.  Blood pressure 105/68, pulse 91, temperature 98 F (36.7 C), resp. rate 17, SpO2 97.00%.  Physical Exam  Nursing note and vitals reviewed.  Constitutional: He appears in mild distress due to persistent bleeding. HENT:  His pupils are equal, round, reactive to light. Extraocular motion is intact. Examination of the ears shows normal auricles and external auditory canals bilaterally.  Nasal examination shows normal mucosa, septum, turbinates. No bleeding anteriorly. However, brisk bleeding is noted down the right posterior nasopharynx. Facial examination shows no asymmetry. Palpation of the face elicit no significant tenderness. Oral cavity examination shows no mucosal lacerations. No significant trismus is noted. Palpation of the neck reveals no lymphadenopathy or mass. The trachea is midline. The thyroid is not significantly enlarged. Cranial nerves 2-12 are all grossly in tact. Eyes: Conjunctivae and EOM are normal. Pupils are equal, round, and reactive to light. No scleral icterus.  Neck: Neck supple. No JVD present. No tracheal deviation present.  No thyromegaly present.  Cardiovascular: Normal rate, regular rhythm. Musculoskeletal: Normal range of motion. He exhibits no edema and no tenderness.  Lymphadenopathy:  He has no cervical adenopathy.  Psychiatric: He has a normal mood and affect. His behavior is normal. Judgment and thought content normal.   Procedure: Anterior/Posterior nasal packing for control of right posterior epistaxis. Anesthesia: Topical xylocaine and Afrin Description: The patient is placed upright in her hospital bed.  Blood clot is suctions from  the right nasal cavity. Bleeding is noted from posterior right nasal cavity.Topical xylocaine and Afrin are applied. A 10 cm Merocel packing is placed in the right nasal cavity with good hemostasis.  The patient tolerated the procedure well.  Assessment/Plan: Right posterior epistaxis, likely secondary to atherosclerotic vascular disease. Right AP packing placed. Will leave packing in place for 5 days.  Pt will need gram + abx coverage.  Pt may f/u with me as an outpatient in 5 days.  Tyreka Henneke,SUI W 03/28/2012, 6:21 AM

## 2012-03-28 NOTE — ED Notes (Signed)
Pt states seen here for nose bleed this morning saw MD Illene Silver for nose bleed wouldn't stop to come back. Pt nose did stop bleeding but started again. Pt has cup full of blood and spitting blood.

## 2012-03-28 NOTE — ED Provider Notes (Addendum)
History     CSN: [id number]  Arrival date & time 03/28/12  4742   First MD Initiated Contact with Patient 03/28/12 [phone]      Chief Complaint  Patient presents with  . Epistaxis    (Consider location/radiation/quality/duration/timing/severity/associated sxs/prior treatment) HPI 56 year old male presents to the emergency department with epistaxis. Patient reports he woke up at 3 AM with bleeding from his right naris. Patient and wife report he has had heavy bleeding since waking. They've tried cold compresses without improvement. Patient is on Plavix and aspirin, but no other blood thinning medications. Wife reports patient had nosebleed earlier in the month that resolved on tone with cold compresses. Patient has had hot and cold flashes and diaphoresis with this nosebleed. He denies previous use of cocaine or nasal surgeries.  Past Medical History  Diagnosis Date  . HYPERLIPIDEMIA-MIXED 10/04/2010  . TOBACCO ABUSE 10/04/2010  . HYPERTENSION, BENIGN 10/04/2010  . CAD, NATIVE VESSEL 10/04/2010  . Alcohol abuse 12/27/2010  . Allergic rhinitis, cause unspecified 12/27/2010    Past Surgical History  Procedure Date  . Finger trauma     almost cut off  . Vasectomy     History reviewed. No pertinent family history.  History  Substance Use Topics  . Smoking status: Current Everyday Smoker  . Smokeless tobacco: Not on file   Comment: 2 plus packs per day. He has a 100 + pack - year history of tobacco abuse currently. Former 4 ppd for 25 years.  . Alcohol Use: Yes     He drinks a 6 - pack of beer per night      Review of Systems  All other systems reviewed and are negative.    Allergies  Penicillins  Home Medications   Current Outpatient Rx  Name Route Sig Dispense Refill  . AMLODIPINE BESYLATE 10 MG PO TABS Oral Take 1 tablet (10 mg total) by mouth daily. 30 tablet 8  . ASPIRIN 325 MG PO TABS Oral Take 325 mg by mouth daily.      Marland Kitchen CARVEDILOL 3.125 MG PO TABS  Take 1/2  tablet by mouth twice daily. 60 tablet 8  . CLOPIDOGREL BISULFATE 75 MG PO TABS Oral Take 1 tablet (75 mg total) by mouth daily. 90 tablet 3  . MULTIVITAMINS PO CAPS Oral Take 1 capsule by mouth daily.      Marland Kitchen NITROGLYCERIN 0.4 MG SL SUBL Sublingual Place 1 tablet (0.4 mg total) under the tongue every 5 (five) minutes as needed for chest pain. 1 tablet under tongue at onset of chest pain, you may repeat every 5 minutes for up to 3 doses. 25 tablet 6  . ROSUVASTATIN CALCIUM 40 MG PO TABS  Take one tablet by mouth once daily 90 tablet 3    BP 105/68  Pulse 91  Temp 98 F (36.7 C)  Resp 17  SpO2 97%  Physical Exam  Nursing note and vitals reviewed. Constitutional: He appears distressed (Patient with brisk bleeding from right nare leaning over the trash can uncomfortable and pale appearing).  HENT:  Head: Normocephalic and atraumatic.  Mouth/Throat: Oropharynx is clear and moist.       No bleeding appreciated from anterior portion of right naris. Patient noted to have blood draining down the posterior pharynx. No bleeding seen in left nare  Eyes: Conjunctivae and EOM are normal. Pupils are equal, round, and reactive to light. No scleral icterus.  Neck: Neck supple. No JVD present. No tracheal deviation present. No thyromegaly present.  Cardiovascular: Normal rate, regular rhythm, normal heart sounds and intact distal pulses.  Exam reveals no gallop and no friction rub.   No murmur heard. Pulmonary/Chest: Effort normal and breath sounds normal. No stridor. No respiratory distress. He has no wheezes. He has no rales. He exhibits no tenderness.  Abdominal: Soft. Bowel sounds are normal.  Musculoskeletal: Normal range of motion. He exhibits no edema and no tenderness.  Lymphadenopathy:    He has no cervical adenopathy.  Skin: There is pallor.  Psychiatric: He has a normal mood and affect. His behavior is normal. Judgment and thought content normal.    ED Course  Procedures (including  critical care time)   Labs Reviewed  CBC - Abnormal; Notable for the following:    MCH 34.9 (*)     All other components within normal limits  PROTIME-INR  SAMPLE TO BLOOD BANK   No results found.   1. Right-sided epistaxis       MDM  56 year old male with right-sided posterior epistaxis. Initially attempted a anterior/posterior inflatable Rhino Rocket. Unable to pass through turbinates into the posterior chamber. Downgraded to a 7.5 inflatable Rhino Rocket with some slowing of blood flow. After placement of the 7.5 Rhino Rocket, patient with episode of vomiting producing approximately liter of brown to red emesis containing clots. Patient came more diaphoretic with this episode. Patient then developed bleeding out of left nare and was spitting up blood as well. 7.5 inflatable rhino rocket was removed and replaced with a expandable Rhino Rocket, large size. Patient had better improvement in epistaxis but still having some dripping now from the left there. Given difficulties with complete package and stoppage of epistaxis, discuss with Dr. Suszanne Conners for his consultation in the emergency department for help with epistaxis.          Olivia Mackie, MD 03/28/12 0610  7:11 AM Pt has been seen by Dr Suszanne Conners, merocel packing placed.  No further bleeding.  H/h stable.  Pt to f/u with [facility] on Wednesday.  To be d/c on clindamycin.  Olivia Mackie, MD 03/28/12 [phone]

## 2012-03-29 ENCOUNTER — Encounter (HOSPITAL_COMMUNITY): Payer: Self-pay

## 2012-03-29 ENCOUNTER — Encounter (HOSPITAL_COMMUNITY): Payer: Self-pay | Admitting: Anesthesiology

## 2012-03-29 ENCOUNTER — Encounter (HOSPITAL_COMMUNITY)
Admission: EM | Disposition: A | Discharge status: Home / Self Care | Payer: Self-pay | Source: Home / Self Care | Attending: Otolaryngology

## 2012-03-29 ENCOUNTER — Inpatient Hospital Stay (HOSPITAL_COMMUNITY): Payer: BC Managed Care – PPO

## 2012-03-29 ENCOUNTER — Emergency Department (HOSPITAL_COMMUNITY): Payer: BC Managed Care – PPO | Admitting: Anesthesiology

## 2012-03-29 ENCOUNTER — Inpatient Hospital Stay: Admit: 2012-03-29 | Payer: Self-pay | Admitting: Otolaryngology

## 2012-03-29 DIAGNOSIS — I2510 Atherosclerotic heart disease of native coronary artery without angina pectoris: Secondary | ICD-10-CM

## 2012-03-29 DIAGNOSIS — F17200 Nicotine dependence, unspecified, uncomplicated: Secondary | ICD-10-CM

## 2012-03-29 DIAGNOSIS — E782 Mixed hyperlipidemia: Secondary | ICD-10-CM

## 2012-03-29 DIAGNOSIS — R040 Epistaxis: Principal | ICD-10-CM

## 2012-03-29 LAB — BASIC METABOLIC PANEL
CO2: 29 mEq/L (ref 19–32)
Calcium: 8.2 mg/dL — ABNORMAL LOW (ref 8.4–10.5)
Creatinine, Ser: 0.88 mg/dL (ref 0.50–1.35)
GFR calc non Af Amer: >90 mL/min (ref >90)
Glucose, Bld: 101 mg/dL — ABNORMAL HIGH (ref 70–99)

## 2012-03-29 LAB — CBC
Hemoglobin: 10.6 g/dL — ABNORMAL LOW (ref 13.0–17.0)
MCH: 32 pg (ref 26.0–34.0)
MCHC: 33.9 g/dL (ref 30.0–36.0)
MCV: 94.6 fL (ref 78.0–100.0)
Platelets: 181 10*3/uL (ref 150–400)
RBC: 3.31 MIL/uL — ABNORMAL LOW (ref 4.22–5.81)

## 2012-03-29 LAB — ABO/RH: ABO/RH(D): O POS

## 2012-03-29 SURGERY — CONTROL OF EPISTAXIS, ENDOSCOPIC
Anesthesia: General | Site: Nose | Laterality: Bilateral | Wound class: Clean Contaminated

## 2012-03-29 MED ORDER — FENTANYL CITRATE 0.05 MG/ML IJ SOLN
INTRAMUSCULAR | Status: DC | PRN
  Administered 2012-03-29: 100 ug via INTRAVENOUS

## 2012-03-29 MED ORDER — PHENYLEPHRINE HCL 10 MG/ML IJ SOLN
10.0000 mg | INTRAVENOUS | Status: DC | PRN
  Administered 2012-03-29: 25 ug/min via INTRAVENOUS

## 2012-03-29 MED ORDER — HYDROMORPHONE HCL PF 1 MG/ML IJ SOLN: 0.2500 mg | INTRAMUSCULAR | Status: DC | PRN

## 2012-03-29 MED ORDER — PHENYLEPHRINE HCL 10 MG/ML IJ SOLN
INTRAMUSCULAR | Status: DC | PRN
  Administered 2012-03-29: 120 ug via INTRAVENOUS

## 2012-03-29 MED ORDER — OXYMETAZOLINE HCL 0.05 % NA SOLN
NASAL | Status: DC | PRN
  Administered 2012-03-29: 1 via NASAL

## 2012-03-29 MED ORDER — SUCCINYLCHOLINE CHLORIDE 20 MG/ML IJ SOLN
INTRAMUSCULAR | Status: DC | PRN
  Administered 2012-03-29: 100 mg via INTRAVENOUS

## 2012-03-29 MED ORDER — ALBUTEROL SULFATE HFA 108 (90 BASE) MCG/ACT IN AERS
4.0000 | INHALATION_SPRAY | RESPIRATORY_TRACT | Status: DC | PRN
  Filled 2012-03-29: qty 6.7

## 2012-03-29 MED ORDER — LACTATED RINGERS IV SOLN
INTRAVENOUS | Status: DC | PRN
  Administered 2012-03-29: via INTRAVENOUS

## 2012-03-29 MED ORDER — LIDOCAINE HCL (CARDIAC) 20 MG/ML IV SOLN
INTRAVENOUS | Status: DC | PRN
  Administered 2012-03-29: 75 mg via INTRAVENOUS

## 2012-03-29 MED ORDER — ONDANSETRON HCL 4 MG/2ML IJ SOLN
INTRAMUSCULAR | Status: DC | PRN
  Administered 2012-03-29: 4 mg via INTRAVENOUS

## 2012-03-29 MED ORDER — ACETAMINOPHEN 10 MG/ML IV SOLN: 1000.0000 mg | Freq: Once | INTRAVENOUS | Status: DC | PRN

## 2012-03-29 MED ORDER — HETASTARCH-ELECTROLYTES 6 % IV SOLN
INTRAVENOUS | Status: DC | PRN
  Administered 2012-03-29: 01:00:00 via INTRAVENOUS

## 2012-03-29 MED ORDER — SODIUM CHLORIDE 0.9 % IV SOLN
INTRAVENOUS | Status: DC | PRN
  Administered 2012-03-29: 01:00:00 via INTRAVENOUS

## 2012-03-29 MED ORDER — ONDANSETRON HCL 4 MG/2ML IJ SOLN: 4.0000 mg | Freq: Once | INTRAMUSCULAR | Status: DC | PRN

## 2012-03-29 MED ORDER — PROPOFOL 10 MG/ML IV EMUL
INTRAVENOUS | Status: DC | PRN
  Administered 2012-03-29: 150 mg via INTRAVENOUS

## 2012-03-29 MED ORDER — SODIUM CHLORIDE 0.9 % IV SOLN: 250.0000 mL | INTRAVENOUS | Status: DC | PRN

## 2012-03-29 MED ORDER — CARVEDILOL 3.125 MG PO TABS
3.1250 mg | ORAL_TABLET | Freq: Two times a day (BID) | ORAL | Status: DC
  Administered 2012-03-30: 3.125 mg via ORAL
  Filled 2012-03-29 (×4): qty 1

## 2012-03-29 MED ORDER — NICOTINE 21 MG/24HR TD PT24
21.0000 mg | MEDICATED_PATCH | Freq: Every day | TRANSDERMAL | Status: DC
  Filled 2012-03-29: qty 1

## 2012-03-29 MED ORDER — DEXTROSE 5 % IV SOLN
1.0000 g | INTRAVENOUS | Status: DC
  Administered 2012-03-29 – 2012-03-30 (×2): 1 g via INTRAVENOUS
  Filled 2012-03-29 (×2): qty 10

## 2012-03-29 MED ORDER — ATORVASTATIN CALCIUM 80 MG PO TABS
80.0000 mg | ORAL_TABLET | Freq: Every day | ORAL | Status: DC
  Administered 2012-03-29: 80 mg via ORAL
  Filled 2012-03-29 (×2): qty 1

## 2012-03-29 MED ORDER — NICOTINE 21 MG/24HR TD PT24
21.0000 mg | MEDICATED_PATCH | Freq: Every day | TRANSDERMAL | Status: DC
  Administered 2012-03-29 – 2012-03-30 (×2): 21 mg via TRANSDERMAL
  Filled 2012-03-29 (×2): qty 1

## 2012-03-29 SURGICAL SUPPLY — 19 items
CANISTER SUCTION 2500CC (MISCELLANEOUS) ×1 IMPLANT
COAGULATOR SUCT SWTCH 10FR 6 (ELECTROSURGICAL) ×1 IMPLANT
CONT SPEC 4OZ CLIKSEAL STRL BL (MISCELLANEOUS) ×1 IMPLANT
COVER TABLE BACK 60X90 (DRAPES) ×1 IMPLANT
DRAPE PROXIMA HALF (DRAPES) ×1 IMPLANT
DRESSING NASAL POPE 10X1.5X2.5 (GAUZE/BANDAGES/DRESSINGS) IMPLANT
DRSG NASAL POPE 10X1.5X2.5 (GAUZE/BANDAGES/DRESSINGS) ×6
ELECT REM PT RETURN 9FT ADLT (ELECTROSURGICAL) ×2
ELECTRODE REM PT RTRN 9FT ADLT (ELECTROSURGICAL) IMPLANT
GAUZE SPONGE 4X4 16PLY XRAY LF (GAUZE/BANDAGES/DRESSINGS) ×2 IMPLANT
GLOVE ECLIPSE 7.5 STRL STRAW (GLOVE) ×1 IMPLANT
GOWN STRL NON-REIN LRG LVL3 (GOWN DISPOSABLE) ×1 IMPLANT
KIT ROOM TURNOVER OR (KITS) ×1 IMPLANT
MATRIX HEMOSTAT SURGIFLO (HEMOSTASIS) ×1 IMPLANT
NS IRRIG 1000ML POUR BTL (IV SOLUTION) ×1 IMPLANT
SPONGE NEURO XRAY DETECT 1X3 (DISPOSABLE) ×1 IMPLANT
TOWEL OR 17X24 6PK STRL BLUE (TOWEL DISPOSABLE) ×1 IMPLANT
TUBE CONNECTING 12X1/4 (SUCTIONS) ×1 IMPLANT
WATER STERILE IRR 1000ML POUR (IV SOLUTION) ×1 IMPLANT

## 2012-03-29 NOTE — Progress Notes (Signed)
[facility] P[facility]  Discussed case with ENT  Keep off plavix per them until Friday when packing changed. Will monitor in house 24 hours then d/c in AM Would transition ceftriaxone to bactrim with packing in place.  Yolonda Kida PCCM Pager: [phone] Cell: [phone] If no response, call [phone]

## 2012-03-29 NOTE — Anesthesia Preprocedure Evaluation (Signed)
Anesthesia Evaluation  Patient identified by MRN, date of birth, ID band Patient awake    Reviewed: Allergy & Precautions, H&P , NPO status   Airway Mallampati: II      Dental  (+) Teeth Intact and Poor Dentition   Pulmonary  breath sounds clear to auscultation        Cardiovascular Rhythm:Regular Rate:Normal     Neuro/Psych    GI/Hepatic   Endo/Other    Renal/GU      Musculoskeletal   Abdominal   Peds  Hematology   Anesthesia Other Findings   Reproductive/Obstetrics                           Anesthesia Physical Anesthesia Plan  ASA: III and Emergent  Anesthesia Plan: General   Post-op Pain Management:    Induction: Intravenous, Rapid sequence and Cricoid pressure planned  Airway Management Planned: Oral ETT  Additional Equipment:   Intra-op Plan:   Post-operative Plan: Extubation in OR  Informed Consent: I have reviewed the patients History and Physical, chart, labs and discussed the procedure including the risks, benefits and alternatives for the proposed anesthesia with the patient or authorized representative who has indicated his/her understanding and acceptance.   Dental advisory given  Plan Discussed with: CRNA and Surgeon  Anesthesia Plan Comments: (Nose bleeed with significant bllod loss On plavix CAD S/P PTCA LAD last cath 03/11/11 stent patent LV EF 55-60% by echo 6/12 Htn Smoker  Plan GA with RSI  Kipp Brood, MD)        Anesthesia Quick Evaluation

## 2012-03-29 NOTE — Progress Notes (Signed)
Anesthesiology Follow-up   Awake, alert wants to go home. Denies any further nasal bleeding Hemodynamically stable.  VS; T- 37.3 BP- 101/63 RR-12 HR-75  H/H 10.6/31.3 Plts 181,000 BMET OK  As noted, front upper tooth dislodged. Most likely when he bit on the oral airway during emergence from anesthesia. I discussed this with Mr. Inglett, his wife now has the tooth.  Kipp Brood, MD

## 2012-03-29 NOTE — Progress Notes (Signed)
56yo male c/o repeat epistaxis, noted to be bleeding from both nostrils and nasopharynx, tachycardic/hypotensive in ED, now s/p cauterization in OR, to begin Rocephin.  Will start Rocephin 1g IV Q24H.  Vernard Gambles, PharmD, BCPS 03/29/2012 3:33 AM

## 2012-03-29 NOTE — Addendum Note (Signed)
Addendum  created 03/29/12 0813 by Kipp Brood, MD   Modules edited:Notes Section

## 2012-03-29 NOTE — Anesthesia Postprocedure Evaluation (Signed)
  Anesthesia Post-op Note  Patient: John [name]  Procedure(s) Performed: Procedure(s) (LRB): NASAL ENDOSCOPY WITH EPISTAXIS CONTROL (Bilateral)  Patient Location: PACU  Anesthesia Type: General  Level of Consciousness: awake, alert  and oriented  Airway and Oxygen Therapy: Patient Spontanous Breathing and Patient connected to nasal cannula oxygen  Post-op Pain: none  Post-op Assessment: Post-op Vital signs reviewed and Patient's Cardiovascular Status Stable  Post-op Vital Signs: stable  Complications: No apparent anesthesia complications

## 2012-03-29 NOTE — Transfer of Care (Signed)
Immediate Anesthesia Transfer of Care Note  Patient: John [name]  Procedure(s) Performed: Procedure(s) (LRB): NASAL ENDOSCOPY WITH EPISTAXIS CONTROL (Bilateral)  Patient Location: PACU  Anesthesia Type: General  Level of Consciousness: awake, alert  and oriented  Airway & Oxygen Therapy: Patient Spontanous Breathing  Post-op Assessment: Report given to PACU RN and Post -op Vital signs reviewed and stable  Post vital signs: Reviewed and stable  Complications: No apparent anesthesia complications

## 2012-03-29 NOTE — Op Note (Signed)
NAMEZAVIAN, SLOWEY            ACCOUNT NO.:  [id number]  MEDICAL RECORD NO.:  [id number]  LOCATION:  [facility]                         FACILITY:  [facility]  PHYSICIAN:  Newman Pies, MD            DATE OF BIRTH:  06-30-1956  DATE OF PROCEDURE:  03/29/2012 DATE OF DISCHARGE:                              OPERATIVE REPORT   SURGEON:  Newman Pies, MD  PREOPERATIVE DIAGNOSES:  Bilateral posterior epistaxis.  POSTOPERATIVE DIAGNOSIS:  Bilateral posterior epistaxis.Marland Kitchen  PROCEDURE PERFORMED:  Bilateral endoscopic control of nasal hemorrhage.  ANESTHESIA:  General endotracheal tube anesthesia.  COMPLICATIONS:  None.  ESTIMATED BLOOD LOSS:  100 mL.  INDICATION FOR PROCEDURE:  The patient is a 56 year old male, who initially presented to the [facility] emergency room yesterday morning, complaining of profuse right-sided epistaxis.  The bleeding was eventually controlled with right anterior/posterior Merocel packing. The patient was discharged home.  However, the patient returned to the [facility] emergency room yesterday evening, complaining of bilateral epistaxis.  He has also been vomiting a large amount of bloody vomit. He was noted to be tachycardic at 110, and hypotensive with systolic blood pressure in the 90s.  His hemoglobin has also dropped from 15-12. On examination, the patient was noted to have active bleeding down the posterior nasopharynx.  He was also noted to have bleeding from both nasal cavity, even around the right nasal packing.  Based on the above findings, the decision was made for the patient to undergo control of nasal hemorrhage in the operating room under general anesthesia.  The risks, benefits, alternatives, and details of the procedure were discussed with the patient and his wife.  Questions were invited and answered.  Informed consent was obtained.  DESCRIPTION OF THE PROCEDURE:  The patient was taken to the operating room and placed supine on the operating table.   General endotracheal tube anesthesia was administered by the anesthesiologist.  It should be noted that the patient became hypotensive, with systolic blood pressure in the 70s.  He was given Neo-Synephrine with improvement in his blood pressure.  The right nasal packing was carefully removed.  Pledgets soaked with Afrin were placed in both nasal cavities for vasoconstriction.  Active bleeding was again noted in both nasal cavities, worse on the right side.  With 0 degree endoscope, the right nasal cavity was carefully examined.  He was noted to have a large right septal spur at the midportion of the septum.  The septal spur was bluntly reduced.  An active bleeding source was noted at posterior nasal septum, posterior to the septal spur.  The active bleeding site was extensively with suction electrocautery.  Several other small bleeding area was also noted at the posterosuperior lateral nasal wall. Attention was then focused on the left side.  Several minor bleeding areas were noted at the posterior nasal septum as well.  The areas were all cauterized.  FloSeal was then formulated and applied to both nasal cavity.  A 10 mL Merocel packing was then placed in the right nasal cavity.  Good hemostasis was achieved.  The patient was turned over to the anesthesiologist.  The patient was awakened from anesthesia without difficulty.  He was extubated and transferred to the recovery room in good condition.  OPERATIVE FINDINGS:  An active arterial pumper was noted at the right posterior nasal septum.  Several minor bleeding areas were also noted at the right lateral nasal wall and left posterior nasal septum.  SPECIMEN:  None.  FOLLOWUP CARE:  The patient will be observed in the intensive care unit after the surgery, in order to closely monitor his cardiovascular status.  It should be noted that the patient was previously on aspirin and Plavix.  The anticoagulation medicines will be held until  his condition improves.     Newman Pies, MD     ST/MEDQ  D:  03/29/2012  T:  03/29/2012  Job:  [id number]

## 2012-03-29 NOTE — Anesthesia Procedure Notes (Signed)
Procedure Name: Intubation Date/Time: 03/29/2012 12:40 AM Performed by: Gwenyth Allegra Pre-anesthesia Checklist: Emergency Drugs available, Patient identified, Timeout performed and Suction available Patient Re-evaluated:Patient Re-evaluated prior to inductionOxygen Delivery Method: Circle system utilized Preoxygenation: Pre-oxygenation with 100% oxygen Intubation Type: IV induction, Rapid sequence and Cricoid Pressure applied Laryngoscope Size: Mac and 3 Grade View: Grade II Tube type: Oral Number of attempts: 1 Airway Equipment and Method: Stylet Placement Confirmation: ETT inserted through vocal cords under direct vision,  breath sounds checked- equal and bilateral and positive ETCO2 Secured at: 22 cm Tube secured with: Tape Dental Injury: Teeth and Oropharynx as per pre-operative assessment

## 2012-03-29 NOTE — Anesthesia Postprocedure Evaluation (Signed)
  Anesthesia Post-op Note  Patient: John [name]  Procedure(s) Performed: Procedure(s) (LRB): NASAL ENDOSCOPY WITH EPISTAXIS CONTROL (Bilateral)  Patient Location: PACU  Anesthesia Type: General  Level of Consciousness: awake, alert  and oriented  Airway and Oxygen Therapy: Patient Spontanous Breathing  Post-op Pain: none  Post-op Assessment: Post-op Vital signs reviewed and Patient's Cardiovascular Status Stable  Post-op Vital Signs: stable  Complications: No apparent anesthesia complications

## 2012-03-29 NOTE — Preoperative (Signed)
Beta Blockers   Reason not to administer Beta Blockers:Not Applicable 

## 2012-03-29 NOTE — Brief Op Note (Signed)
03/28/2012 - 03/29/2012  1:22 AM  PATIENT:  John [name]  56 y.o. male  PRE-OPERATIVE DIAGNOSIS:  Bilateral posterior epistaxis  POST-OPERATIVE DIAGNOSIS:  Bilateral posterior epistaxis  PROCEDURE:  Procedure(s) (LRB): NASAL ENDOSCOPY WITH EPISTAXIS CONTROL (Bilateral)  SURGEON:  Surgeon(s) and Role:    * Darletta Moll, MD - Primary  PHYSICIAN ASSISTANT:   ASSISTANTS: none   ANESTHESIA:   general  EBL:  Total I/O In: 1850 [I.V.:1500; Blood:350] Out: -   BLOOD ADMINISTERED:1 unit PRBC  DRAINS: none   LOCAL MEDICATIONS USED:  NONE  SPECIMEN:  No Specimen  DISPOSITION OF SPECIMEN:  N/A  COUNTS:  YES  TOURNIQUET:  * No tourniquets in log *  DICTATION: .Other Dictation: Dictation Number [id number]  PLAN OF CARE: Admit to inpatient   PATIENT DISPOSITION:  ICU - extubated and stable.   Delay start of Pharmacological VTE agent (>24hrs) due to surgical blood loss or risk of bleeding: yes

## 2012-03-29 NOTE — H&P (Signed)
Name: John [name] MRN: [id number] DOB: 05/28/1956    LOS: 1  PULMONARY / CRITICAL CARE MEDICINE  HPI:   56 year old male with PMH relevant for COPD,  HTN and CAD on aspirin and plavix. Presented yesterday with epistaxis. Seen by ENT and bleeding was controlled with packing. Today he is back with more profuse epistaxis. On exam posterior pharynx active bleeding was noted. The patient vomited dark old blood several times. In the ER hypotensive with SBP in the 80's and also tachycardic. He was reassessed by ENT (Dr. Suszanne Conners) and he was taken to the OR for bleeding source control. A large septal spur was noted and cauterized. Several other bleeding sites were also cauterized with good hemostasis. The patient required phenylephrine for hypotension in the OR. He was extubated and transferred to the ICU in good condition. At the time of my exam the patient is awake, alert and hemodynamically stable. The patient denies CP, SOB, dizziness.   Past Medical History  Diagnosis Date  . HYPERLIPIDEMIA-MIXED 10/04/2010  . TOBACCO ABUSE 10/04/2010  . HYPERTENSION, BENIGN 10/04/2010  . CAD, NATIVE VESSEL 10/04/2010  . Alcohol abuse 12/27/2010  . Allergic rhinitis, cause unspecified 12/27/2010   Past Surgical History  Procedure Date  . Finger trauma     almost cut off  . Vasectomy    Prior to Admission medications   Medication Sig Start Date End Date Taking? Authorizing Provider  amLODipine (NORVASC) 10 MG tablet Take 1 tablet (10 mg total) by mouth daily. 02/13/12 02/12/13 Yes Kathleene Hazel, MD  aspirin 325 MG tablet Take 325 mg by mouth daily.     Yes Historical Provider, MD  carvedilol (COREG) 3.125 MG tablet Take 1/2 tablet by mouth twice daily. 05/21/11  Yes Kathleene Hazel, MD  clindamycin (CLEOCIN) 150 MG capsule Take 2 capsules (300 mg total) by mouth every 6 (six) hours. 03/28/12 04/07/12 Yes Olivia Mackie, MD  clopidogrel (PLAVIX) 75 MG tablet Take 1 tablet (75 mg total) by mouth daily.  11/13/11 11/12/12 Yes Kathleene Hazel, MD  Multiple Vitamin (MULTIVITAMIN) capsule Take 1 capsule by mouth daily.     Yes Historical Provider, MD  oxyCODONE-acetaminophen (PERCOCET) 5-325 MG per tablet Take 2 tablets by mouth every 4 (four) hours as needed for pain. 03/28/12 04/07/12 Yes Olivia Mackie, MD  rosuvastatin (CRESTOR) 40 MG tablet Take one tablet by mouth once daily 12/03/11  Yes Kathleene Hazel, MD  nitroGLYCERIN (NITROSTAT) 0.4 MG SL tablet Place 1 tablet (0.4 mg total) under the tongue every 5 (five) minutes as needed for chest pain. 1 tablet under tongue at onset of chest pain, you may repeat every 5 minutes for up to 3 doses. 11/13/11   Kathleene Hazel, MD   Allergies Allergies  Allergen Reactions  . Penicillins Other (See Comments)    unknown    Family History No family history on file. Social History  reports that he has been smoking.  He does not have any smokeless tobacco history on file. He reports that he drinks alcohol. He reports that he does not use illicit drugs. Smokes since the age of 8, 2 to 3 packs per day.  Review Of Systems:  All systems reviewed and negative except for what I mentioned in the HPI. .   Vital Signs: Temp:  [96.6 F (35.9 C)-98.1 F (36.7 C)] 98.1 F (36.7 C) (07/07 0211) Pulse Rate:  [78-110] 78  (07/07 0330) Resp:  [16-24] 21  (07/07 0330) BP: (  78-129)/(50-98) 106/67 mmHg (07/07 0330) SpO2:  [89 %-100 %] 92 % (07/07 0330) Weight:  [173 lb (78.472 kg)] 173 lb (78.472 kg) (07/07 0245)  Physical Examination: General:  Awake, alert, no acute distress Neuro:  nonfocal HEENT:  PERRL, pink conjunctivae, moist membranes Neck:  Supple, no JVD   Cardiovascular:  RRR, no M/R/G Lungs:  CTA, no W/R/R Abdomen:  Soft, nontender, nondistended, bowel sounds present Musculoskeletal:  Moves all extremities, no pedal edema Skin:  No rash    ASSESSMENT AND PLAN 56 year old male with PMH relevant for COPD,  HTN, CAD on aspirin and  plavix. Presents with severe epistaxis and hemorrhagic shock. Now post surgical hemostasis, extubated and hemodynamically stable. He received 1 unit of PRBC's and platelets.  PULMONARY No results found for this basename: PHART:5,PCO2:5,PCO2ART:5,PO2ART:5,HCO3:5,O2SAT:5 in the last 168 hours  A:   1) Questionable history of COPD 2) Mild transient hypoxemia P:   - Supplemental O2 via [place] 2 L/min to keep O2 sat >94% - Will repeat chest X ray. - Nicotine patch  CARDIOVASCULAR No results found for this basename: TROPONINI:5,LATICACIDVEN:5, O2SATVEN:5,PROBNP:5 in the last 168 hours ECG:  NSR Lines: 2 peripheral lines  A:  1) Hemorrhagic shock secondary to epistaxis. Improved. Hemodynamically stable 2) CAD on aspirin and plavix 3) History of hypertension P:  - Stat CBC and will repeat at 0900 - Transfuse for Hgb< 10 or if active bleeding - Will hold aspirin and plavix - Will hold antihypertensives and beta blocker. - Continue statin - Cardiology consult in am.  RENAL  Lab 03/28/12 2234  NA 140  K 3.8  CL 101  CO2 --  BUN 25*  CREATININE 1.20  CALCIUM --  MG --  PHOS --   Intake/Output      07/06 0701 - 07/07 0700   P.O. 240   I.V. (mL/kg) 1900 (24.2)   Blood 942   IV Piggyback 250   Total Intake(mL/kg) 3332 (42.5)   Urine (mL/kg/hr) 500 (0.3)   Total Output 500   Net +2832          A:   No issues P:  - Monitor BMP in am  GASTROINTESTINAL No results found for this basename: AST:5,ALT:5,ALKPHOS:5,BILITOT:5,PROT:5,ALBUMIN:5 in the last 168 hours  A:   No issues P:   - GI prophylaxis with protonix  HEMATOLOGIC  Lab 03/28/12 2234 03/28/12 2012 03/28/12 0615  HGB 10.2* 12.9* 15.0  HCT 30.0* 36.5* 43.0  PLT -- 218 198  INR -- -- 0.88  APTT -- -- --   A:   1) Blood loss anemia 2) Platelet dysfunction secondary to aspirin and plavix P:  - As mentioned above.  INFECTIOUS  Lab 03/28/12 2012 03/28/12 0615  WBC 9.9 9.7  PROCALCITON -- --    Antibiotics: - Rocephin per ENT request  A:   No issues    ENDOCRINE No results found for this basename: GLUCAP:5 in the last 168 hours A:   No issues   NEUROLOGIC  A:   Awake, alert, non focal.   BEST PRACTICE / DISPOSITION - Level of Care:  ICU - Primary Service:  PCCM - Consultants: ENT, cardiology - Code Status:  Full - Diet:  NPO  - DVT Px:  SCD's - GI Px:  Protonix - Skin Integrity:  Intact - Social / Family:  Family updated at bedside.  The patient is critically ill with multiple organ systems failure and requires high complexity decision making for assessment and support, frequent evaluation and titration  of therapies, application of advanced monitoring technologies and extensive interpretation of multiple databases.   Critical Care Time devoted to patient care services described in this note is: 1 Hour  Overton Mam, M.D. Pulmonary and Critical Care Medicine [facility] Pager: [phone]  03/29/2012, 3:43 AM

## 2012-03-29 NOTE — H&P (Signed)
Name: John [name] MRN: [id number] DOB: July 21, 1956    LOS: 1  PULMONARY / CRITICAL CARE MEDICINE  HPI:   56 year old male with PMH with CAD on ASA plavix presented with recurrent severe epistaxis requiring OR cautery of a spur on 7/7.  PCCM asked to consult.  Subjective: Awake and alert, wants to eat  Events: 7/7 nasal endoscopy with epistaxis control  Vital Signs: Temp:  [96.6 F (35.9 C)-99.2 F (37.3 C)] 99.2 F (37.3 C) (07/07 0718) Pulse Rate:  [69-110] 72  (07/07 0700) Resp:  [11-25] 17  (07/07 0700) BP: (78-127)/(50-78) 111/62 mmHg (07/07 0700) SpO2:  [89 %-100 %] 96 % (07/07 0700) Weight:  [78.472 kg (173 lb)] 78.472 kg (173 lb) (07/07 0245)  Physical Examination: General: awake alert, comfortable, wants to go home HEENT: Packing R nare, OP without obvious blood PULM: CTA B CV: RRR, no mgr AB: BS+, soft, nontender Ext: warm, no edema Neuro: A&Ox4, non focal    ASSESSMENT AND PLAN 56 year old male with PMH relevant for COPD,  HTN, CAD on aspirin and plavix. Presents with severe epistaxis and hemorrhagic shock. Now post surgical hemostasis, extubated and hemodynamically stable. He received 1 unit of PRBC's and platelets.  PULMONARY No results found for this basename: PHART:5,PCO2:5,PCO2ART:5,PO2ART:5,HCO3:5,O2SAT:5 in the last 168 hours  7/7 CXR normal  A:   1) Questionable history of COPD 2) Mild transient hypoxemia around admission, resolved P:   - Supplemental O2 via [place] 2 L/min to keep O2 sat >94% - Nicotine patch  CARDIOVASCULAR No results found for this basename: TROPONINI:5,LATICACIDVEN:5, O2SATVEN:5,PROBNP:5 in the last 168 hours ECG:  NSR Lines: 2 peripheral lines  A:  1) Hemorrhagic shock secondary to epistaxis. Resolved after 1 U PRBC  2) CAD on aspirin and plavix; DES placed 08/2010 (plavix now held for bleeding); no evidence of ischaemia 7/7 3) History of hypertension P:  - Stat CBC and will repeat at 0900 - Transfuse for Hgb< 10  or if active bleeding - Will hold aspirin and plavix - restart home coreg - Continue statin - Check 12 lead EKG again this morning - Discussed plavix/asa with [facility]), they would prefer to restart the plavix ASAP - Need to clarify bleeding risk with ENT  RENAL  Lab 03/29/12 0643 03/28/12 2234  NA 142 140  K 4.1 3.8  CL 106 101  CO2 29 --  BUN 27* 25*  CREATININE 0.88 1.20  CALCIUM 8.2* --  MG -- --  PHOS -- --   Intake/Output      07/06 0701 - 07/07 0700 07/07 0701 - 07/08 0700   P.O. 600    I.V. (mL/kg) 1990 (25.4) 20 (0.3)   Blood 942    IV Piggyback 300    Total Intake(mL/kg) 3832 (48.8) 20 (0.3)   Urine (mL/kg/hr) 2100 (1.1)    Total Output 2100    Net +1732 +20           A:   No issues P:  - Monitor BMP in am  GASTROINTESTINAL No results found for this basename: AST:5,ALT:5,ALKPHOS:5,BILITOT:5,PROT:5,ALBUMIN:5 in the last 168 hours  A:   No issues P:   - start cardiac diet  HEMATOLOGIC  Lab 03/29/12 0643 03/28/12 2234 03/28/12 2012 03/28/12 0615  HGB 10.6* 10.2* 12.9* 15.0  HCT 31.3* 30.0* 36.5* 43.0  PLT 181 -- 218 198  INR -- -- -- 0.88  APTT -- -- -- --   A:   1) Blood loss anemia 2)  Platelet dysfunction secondary to aspirin and plavix P:  - As mentioned above. - clarify bleeding risk with ENT (ie. When can we restart plavix)  INFECTIOUS  Lab 03/29/12 0643 03/28/12 2012 03/28/12 0615  WBC 8.2 9.9 9.7  PROCALCITON -- -- --   Antibiotics: - Rocephin per ENT request  A:   No issues    ENDOCRINE No results found for this basename: GLUCAP:5 in the last 168 hours A:   No issues   NEUROLOGIC  A:   Awake, alert, non focal.   BEST PRACTICE / DISPOSITION - Level of Care:  ICU - Primary Service:  ENT - Consultants: ENT, cardiology - Code Status:  Full - Diet:  NPO  - DVT Px:  SCD's - GI Px:  Protonix - Skin Integrity:  Intact - Social / Family:  Patient updated at bedside  OK for SDU or Tele bed from  PCCM standpoint, clarify with ENT  Yolonda Kida PCCM Pager: [phone] Cell: [phone] If no response, call [phone]   03/29/2012, 10:43 AM

## 2012-03-29 NOTE — Progress Notes (Signed)
Report called to [name], RN on 2000.  Pt ambulated to room 2031 accompanied by RN.  VSS.  Belongings in care of pt's wife.

## 2012-03-30 DIAGNOSIS — R040 Epistaxis: Principal | ICD-10-CM

## 2012-03-30 DIAGNOSIS — R578 Other shock: Secondary | ICD-10-CM

## 2012-03-30 DIAGNOSIS — R0609 Other forms of dyspnea: Secondary | ICD-10-CM

## 2012-03-30 LAB — PREPARE PLATELET PHERESIS

## 2012-03-30 LAB — TYPE AND SCREEN
ABO/RH(D): O POS
Antibody Screen: NEGATIVE
Unit division: 0

## 2012-03-30 MED ORDER — SULFAMETHOXAZOLE-TRIMETHOPRIM 800-160 MG PO TABS: 1.0000 | ORAL_TABLET | Freq: Two times a day (BID) | ORAL | Status: AC

## 2012-03-30 MED ORDER — ALBUTEROL SULFATE HFA 108 (90 BASE) MCG/ACT IN AERS: 4.0000 | INHALATION_SPRAY | RESPIRATORY_TRACT | Status: DC | PRN

## 2012-03-30 MED FILL — Bacitracin Oint 500 Unit/GM: CUTANEOUS | Qty: 15 | Status: AC

## 2012-03-30 MED FILL — Bacitracin Oint 500 Unit/GM: CUTANEOUS | Qty: 1 | Status: CN

## 2012-03-30 NOTE — Progress Notes (Signed)
Pt d/c'd per order IV sites removed all ends intact without incident, discharged instructions given with TB evaluation. Family present off unit via WC

## 2012-03-30 NOTE — Care Management Note (Unsigned)
    Page 1 of 1   03/30/2012     12:05:08 PM   CARE MANAGEMENT NOTE 03/30/2012  Patient:  John [name], John [name]   Account Number:  [id number]  Date Initiated:  03/30/2012  Documentation initiated by:  SIMMONS,Akasia Ahmad  Subjective/Objective Assessment:   ADMITTED WITH EPITAXIS; LIVES AT HOME WITH WIFE; WAS IPTA.     Action/Plan:   DISCHARGE PLANNING   Anticipated DC Date:  03/30/2012   Anticipated DC Plan:  HOME/SELF CARE      DC Planning Services  CM consult      Choice offered to / List presented to:             Status of service:  In process, will continue to follow Medicare Important Message given?   (If response is "NO", the following Medicare IM given date fields will be blank) Date Medicare IM given:   Date Additional Medicare IM given:    Discharge Disposition:    Per UR Regulation:  Reviewed for med. necessity/level of care/duration of stay  If discussed at Long Length of Stay Meetings, dates discussed:    Comments:  03/30/12  1204  Jovi Alvizo SIMMONS RN, BSN [phone] NCM WILL FOLLOW.

## 2012-03-30 NOTE — Progress Notes (Signed)
Subjective: Pt reports no more epistaxis. He does have pressure and discomfort over the right midface.  Objective: Vital signs in last 24 hours: Temp:  [96.6 F (35.9 C)-99.2 F (37.3 C)] 98.6 F (37 C) (07/07 2110) Pulse Rate:  [69-101] 89  (07/07 2110) Resp:  [11-25] 20  (07/07 2110) BP: (91-127)/(55-78) 116/77 mmHg (07/07 2110) SpO2:  [89 %-100 %] 96 % (07/07 2110) Weight:  [78.472 kg (173 lb)] 78.472 kg (173 lb) (07/07 0245)  Physical Exam  Constitutional: A&O x3. No distress. HENT:  His pupils are equal, round, reactive to light. Extraocular motion is intact. Examination of the ears shows normal auricles and external auditory canals bilaterally. Nasal examination shows the right merocel packing to be in place. No bleeding. Facial examination shows no asymmetry. Palpation of the face elicit no significant tenderness. Oral cavity examination shows no blood. No significant trismus is noted. Palpation of the neck reveals no lymphadenopathy or mass. The trachea is midline. The thyroid is not significantly enlarged. Cranial nerves 2-12 are all grossly in tact.  Eyes: Conjunctivae and EOM are normal. Pupils are equal, round, and reactive to light. No scleral icterus.  Neck: Neck supple. No JVD present. No tracheal deviation present. No thyromegaly present.  Musculoskeletal: Normal range of motion. He exhibits no edema and no tenderness.  Lymphadenopathy: He has no cervical adenopathy.  Psychiatric: He has a normal mood and affect. His behavior is normal. Judgment and thought content normal.     Basename 03/29/12 0643 03/28/12 2234 03/28/12 2012  WBC 8.2 -- 9.9  HGB 10.6* 10.2* --  HCT 31.3* 30.0* --  PLT 181 -- 218    Basename 03/29/12 0643 03/28/12 2234  NA 142 140  K 4.1 3.8  CL 106 101  CO2 29 --  GLUCOSE 101* 110*  BUN 27* 25*  CREATININE 0.88 1.20  CALCIUM 8.2* --    Medications:  I have reviewed the patient's current medications. Scheduled:   . atorvastatin  80 mg  Oral q1800  . carvedilol  3.125 mg Oral BID WC  . cefTRIAXone (ROCEPHIN)  IV  1 g Intravenous Q24H  . nicotine  21 mg Transdermal Daily  . DISCONTD: nicotine  21 mg Transdermal Daily    Assessment/Plan: Pt's posterior epistaxis is currently under control status post bilateral endoscopic cautery of his nasal cavities. Will leave his right-sided nasal packing in place until Friday. Will need gram + antibiotic coverage until the packing is removed. He is instructed to avoid heavy lifting greater than 20 pounds for one week. Will resume Plavix on Friday if he remains free of bleeding after packing removal.   LOS: 2 days   Babita Amaker,SUI W 03/30/2012, 12:39 AM

## 2012-03-30 NOTE — Discharge Summary (Signed)
Physician Discharge Summary  Patient ID: John [name] MRN: [id number] DOB/AGE: 55-Dec-1957 55 y.o.  Admit date: 03/28/2012 Discharge date: 03/30/2012    Discharge Diagnoses:  Active Problems:  HYPERTENSION, BENIGN  CAD, NATIVE VESSEL  Hemorrhagic shock  Epistaxis    Brief Summary: John [name] is a 56 y.o. y/o male with a PMH of COPD, HTN, CAD s/p DES 2011 on asa and plavix. He presented initially  7/5 with epistaxis and was seen by ENT. Bleeding controlled with packing and d/c.  However pt returned 7/6 with profuse epistaxis with mild hypotension and tachycardia.  Plavix was d/c, and he was seen again by ENT Suszanne Conners) and was taken to OR for cautery of large septal spur with good hemostasis.  Pt required pressors in OR for hypotension and was therefor sent to ICU post op and PCCM asked to assume care.                                                                     Hospital Summary by Discharge Diagnosis  Epistaxis -- In setting asa, plavix with large septal spur.  Plavix, asa held.   Seen by ENT, required OR cautery with good hemostasis achieved and residual nasal packing.  Pt will f/u 7/12 with ENT for removal of packing and further discussion of plavix restart. NO asa or plavix until f/u with ENT.  Will cont abx (Bactrim) until f/u with ENT.   Hemorrhagic shock secondary to epistaxis. Resolved after 1 U PRBC -- in setting above.  Hgb stabilized.  No further bleeding.  CAD on aspirin and plavix; DES placed 08/2010 (plavix now held for bleeding) -- no evidence ischemia.  Will f/u with cards as outpt.  Discussed with Dr. Antoine Poche who does recommend restart plavix asap from cardiac standpoint.    HTN -- initially hypotensive in setting hemorrhage.  Required short term pressors, BP now trending back to normal.  Was restarted on home coreg but BP has been well controlled with this, will hold off on restart norvasc for now.  Will f/u with PCP and cardiology as outpt.    Questionable hx COPD -- with long hx tobacco abuse.  Required O2 only transiently and now on RA.  Smoking cessation encouraged.  F/u with PCP.    Filed Vitals:   03/29/12 1443 03/29/12 1758 03/29/12 2110 03/30/12 0341  BP: 99/59 98/60 116/77 108/67  Pulse: 90 88 89 95  Temp: 98.1 F (36.7 C)  98.6 F (37 C) 98.5 F (36.9 C)  TempSrc:   Oral Oral  Resp: 18 18 20 20   Height:      Weight:    169 lb 14.4 oz (77.066 kg)  SpO2: 96%  96% 95%  On RA.    Discharge Labs  BMET  Lab 03/29/12 0643 03/28/12 2234  NA 142 140  K 4.1 3.8  CL 106 101  CO2 29 --  GLUCOSE 101* 110*  BUN 27* 25*  CREATININE 0.88 1.20  CALCIUM 8.2* --  MG -- --  PHOS -- --     CBC   Lab 03/29/12 0643 03/28/12 2234 03/28/12 2012 03/28/12 0615  HGB 10.6* 10.2* 12.9* --  HCT 31.3* 30.0* 36.5* --  WBC 8.2 -- 9.9 9.7  PLT 181 --  218 198   Anti-Coagulation  Lab 03/28/12 0615  INR 0.88      Discharge Orders    Future Appointments: Provider: Department: Dept Phone: Center:   04/15/2012 9:45 AM Dyann Kief, PA [facility] [phone] [facility]   04/15/2012 1:45 PM Corwin Levins, MD [facility] [phone] [facility]   05/11/2012 3:30 PM Kathleene Hazel, MD [facility] [phone] [facility]       Follow-up Information    Follow up with Darletta Moll, MD on 04/03/2012. (1:00pm)    Contact information:   [address] [place] [address] [phone]       Follow up with Oliver Barre, MD on 04/15/2012. (1:45pm )    Contact information:   [address] [facility] [address] [place] [address] [phone]       Follow up with Verne Carrow, MD on 04/15/2012. (With Moroni PA   at 9:45am )    Contact information:   [facility] [address] [place] [address] [phone]           Medication List  As of 03/30/2012  9:38 AM   START taking these medications          albuterol 108 (90 BASE) MCG/ACT inhaler   Commonly known as: PROVENTIL HFA;VENTOLIN HFA   Inhale 4 puffs into the lungs every 4 (four) hours as needed for wheezing or shortness of breath.      sulfamethoxazole-trimethoprim 800-160 MG per tablet   Commonly known as: BACTRIM DS,SEPTRA DS   Take 1 tablet by mouth 2 (two) times daily.         CONTINUE taking these medications         carvedilol 3.125 MG tablet   Commonly known as: COREG   Take 1/2 tablet by mouth twice daily.      multivitamin capsule      nitroGLYCERIN 0.4 MG SL tablet   Commonly known as: NITROSTAT   Place 1 tablet (0.4 mg total) under the tongue every 5 (five) minutes as needed for chest pain. 1 tablet under tongue at onset of chest pain, you may repeat every 5 minutes for up to 3 doses.      oxyCODONE-acetaminophen 5-325 MG per tablet   Commonly known as: PERCOCET   Take 2 tablets by mouth every 4 (four) hours as needed for pain.      rosuvastatin 40 MG tablet   Commonly known as: CRESTOR   Take one tablet by mouth once daily         STOP taking these medications         amLODipine 10 MG tablet      aspirin 325 MG tablet      clindamycin 150 MG capsule      clopidogrel 75 MG tablet          Where to get your medications    These are the prescriptions that you need to pick up. We sent them to a specific pharmacy, so you will need to go there to get them.   [facility] [address] [place], [place] - [address]    [address] [place] [address]    Phone: [phone]        albuterol 108 (90 BASE) MCG/ACT inhaler   sulfamethoxazole-trimethoprim 800-160 MG per tablet              Disposition: 01-Home or Self Care  Discharged Condition: John [name] has met maximum benefit of inpatient care and is medically stable and cleared for discharge.  Patient is pending follow up as above.      Time spent on disposition:  Greater than 35 minutes.    SignedDanford Bad, NP 03/30/2012  9:38 AM Pager: [phone]    Billy Fischer, MD ; [facility] [phone].  After 5:30 PM or weekends, call [phone]

## 2012-04-07 ENCOUNTER — Emergency Department (HOSPITAL_COMMUNITY)
Admission: EM | Admit: 2012-04-07 | Discharge: 2012-04-07 | Disposition: A | Discharge status: Home / Self Care | Payer: BC Managed Care – PPO | Attending: Emergency Medicine | Admitting: Emergency Medicine

## 2012-04-07 ENCOUNTER — Encounter (HOSPITAL_COMMUNITY): Payer: Self-pay

## 2012-04-07 DIAGNOSIS — I10 Essential (primary) hypertension: Secondary | ICD-10-CM | POA: Insufficient documentation

## 2012-04-07 DIAGNOSIS — E785 Hyperlipidemia, unspecified: Secondary | ICD-10-CM | POA: Insufficient documentation

## 2012-04-07 DIAGNOSIS — R040 Epistaxis: Secondary | ICD-10-CM | POA: Insufficient documentation

## 2012-04-07 DIAGNOSIS — F17200 Nicotine dependence, unspecified, uncomplicated: Secondary | ICD-10-CM | POA: Insufficient documentation

## 2012-04-07 LAB — POCT I-STAT, CHEM 8
Calcium, Ion: 1.33 mmol/L — ABNORMAL HIGH (ref 1.12–1.23)
HCT: 35 % — ABNORMAL LOW (ref 39.0–52.0)
TCO2: 29 mmol/L (ref 0–100)

## 2012-04-07 MED ORDER — CLINDAMYCIN HCL 300 MG PO CAPS: 300.0000 mg | ORAL_CAPSULE | Freq: Three times a day (TID) | ORAL | Status: AC

## 2012-04-07 NOTE — ED Notes (Signed)
MD at bedside. 

## 2012-04-07 NOTE — ED Provider Notes (Signed)
History     CSN: [id number]  Arrival date & time 04/07/12  9147   First MD Initiated Contact with Patient 04/07/12 [phone]      No chief complaint on file.   (Consider location/radiation/quality/duration/timing/severity/associated sxs/prior treatment) HPI Comments: Pt is a 56 y.o. y/o male with a PMH of COPD, HTN, CAD s/p DES 2011. He presented initially  7/5 with epistaxis and was seen in ER w rhino rocket placement by ENT.  However, pt returned 7/6 with profuse epistaxis with mild hypotension and tachycardia.  Plavix was d/c, and he was seen again by ENT Suszanne Conners) and was taken to OR for cautery of large septal spur with good hemostasis.  Pt required pressors in OR for hypotension and was therefor sent to ICU post op. Pt states that he re-started his plavix last night and has take one dose. Pt awoke this morning w right nostril epitaxies. Conservative home therapies including pressure and ice attempted. Denies palpitations, light headedness, syncope, fevers, night sweats, chills, trauma, or CA history.   The history is provided by the patient.    Past Medical History  Diagnosis Date  . HYPERLIPIDEMIA-MIXED 10/04/2010  . TOBACCO ABUSE 10/04/2010  . HYPERTENSION, BENIGN 10/04/2010  . CAD, NATIVE VESSEL 10/04/2010  . Alcohol abuse 12/27/2010  . Allergic rhinitis, cause unspecified 12/27/2010    Past Surgical History  Procedure Date  . Finger trauma     almost cut off  . Vasectomy     No family history on file.  History  Substance Use Topics  . Smoking status: Current Everyday Smoker -- 4.0 packs/day for 25 years    Types: Cigarettes  . Smokeless tobacco: Not on file   Comment: 2 plus packs per day. He has a 100 + pack - year history of tobacco abuse currently. Former 4 ppd for 25 years.  . Alcohol Use: 25.2 oz/week    42 Cans of beer per week     He drinks a 6 -12 pack of beer per night      Review of Systems  All other systems reviewed and are negative.    Allergies    Penicillins  Home Medications   Current Outpatient Rx  Name Route Sig Dispense Refill  . ALBUTEROL SULFATE HFA 108 (90 BASE) MCG/ACT IN AERS Inhalation Inhale 4 puffs into the lungs every 4 (four) hours as needed for wheezing or shortness of breath. 1 Inhaler 0  . CARVEDILOL 3.125 MG PO TABS  Take 1/2 tablet by mouth twice daily. 60 tablet 8  . MULTIVITAMINS PO CAPS Oral Take 1 capsule by mouth daily.      Marland Kitchen NITROGLYCERIN 0.4 MG SL SUBL Sublingual Place 1 tablet (0.4 mg total) under the tongue every 5 (five) minutes as needed for chest pain. 1 tablet under tongue at onset of chest pain, you may repeat every 5 minutes for up to 3 doses. 25 tablet 6  . OXYCODONE-ACETAMINOPHEN 5-325 MG PO TABS Oral Take 2 tablets by mouth every 4 (four) hours as needed for pain. 20 tablet 0  . ROSUVASTATIN CALCIUM 40 MG PO TABS  Take one tablet by mouth once daily 90 tablet 3    BP 126/74  Pulse 102  Temp 98.2 F (36.8 C)  Resp 18  SpO2 98%  Physical Exam  Nursing note and vitals reviewed. Constitutional: He is oriented to person, place, and time. He appears well-developed and well-nourished. No distress.       BP 126/74  Pulse 102  Temp  98.2 F (36.8 C)  Resp 18  SpO2 98%   HENT:  Head: Normocephalic and atraumatic.       Active bleeding from right nostril. Unable to visualize internally d/t amt of blood.   Eyes: Conjunctivae and EOM are normal.  Neck: Normal range of motion.  Pulmonary/Chest: Effort normal.  Musculoskeletal: Normal range of motion.  Neurological: He is alert and oriented to person, place, and time.  Skin: Skin is warm and dry. No rash noted. He is not diaphoretic.  Psychiatric: He has a normal mood and affect. His behavior is normal.    ED Course  Procedures (including critical care time)  Labs Reviewed - No data to display No results found.   No diagnosis found.    MDM  Epistaxis   Dr. Suszanne Conners (ENT) to see pt in ED. F-u in 1 wk for packing removal. Pt placed on  Clinda 300 TID. Hgb assessed and is stable. Pt advised to discontinue all asa & plavix. At this time there does not appear to be any evidence of an acute emergency medical condition and the patient appears stable for discharge with appropriate outpatient follow up.Diagnosis was discussed with patient who verbalizes understanding and is agreeable to discharge.          Jaci Carrel, [place] 04/07/12 [phone]

## 2012-04-07 NOTE — ED Notes (Signed)
Pt. States he was sitting down stretching his neck and had sudden onset nosebleed. Hx of same. Had surgery July 6th to cauterize 2 veins in nose. Took 1 plavix last night

## 2012-04-07 NOTE — Consult Note (Signed)
Procedure: Anterior/Posterior nasal packing for control of recurrent right epistaxis.  Indication: The patient returns to the ER today c/o right epistaxis since this AM. Pt was restarted on Plavix yesterday. Pt had a history of drop in BP and hemoglobin due to nosebleeds in past. Currently systolic 108.  Anesthesia: Topical xylocaine and Afrin  Description: The patient is placed upright in her hospital bed.  Blood clot is suctions from the right nasal cavity. Bleeding is noted from posterior right nasal cavity.Topical xylocaine and Afrin are applied. A 10 cm anterior-posterior Merocel packing is placed in the right nasal cavity with good hemostasis.  The patient tolerated the procedure well.  Plan: Hold plavix. Will leave packing in place for 7 days. Clindamycin while packing is in place.   Rondell Pardon Philomena Doheny, MD

## 2012-04-07 NOTE — ED Notes (Signed)
Pt. Here for epistaxis that started this morning. Hx of same. Had surgery on July 6th to cauterize 2 veins in nose. Pt reports drop in BP and hemoglobin due to nosebleeds in past. Currently systolic 108.

## 2012-04-09 NOTE — ED Provider Notes (Signed)
Medical screening examination/treatment/procedure(s) were performed by non-physician practitioner and as supervising physician I was immediately available for consultation/collaboration.   Tashima Scarpulla W. Johnthan Axtman, MD 04/09/12 2152 

## 2012-04-11 ENCOUNTER — Encounter: Payer: Self-pay | Admitting: Internal Medicine

## 2012-04-11 DIAGNOSIS — A630 Anogenital (venereal) warts: Secondary | ICD-10-CM

## 2012-04-11 DIAGNOSIS — Z955 Presence of coronary angioplasty implant and graft: Secondary | ICD-10-CM | POA: Insufficient documentation

## 2012-04-11 HISTORY — DX: Anogenital (venereal) warts: A63.0

## 2012-04-15 ENCOUNTER — Ambulatory Visit (INDEPENDENT_AMBULATORY_CARE_PROVIDER_SITE_OTHER): Payer: BC Managed Care – PPO | Admitting: Physician Assistant

## 2012-04-15 ENCOUNTER — Encounter: Payer: Self-pay | Admitting: Physician Assistant

## 2012-04-15 ENCOUNTER — Ambulatory Visit: Payer: BC Managed Care – PPO | Admitting: Internal Medicine

## 2012-04-15 VITALS — BP 132/72 | HR 84 | Ht 71.0 in | Wt 172.5 lb

## 2012-04-15 DIAGNOSIS — I2510 Atherosclerotic heart disease of native coronary artery without angina pectoris: Secondary | ICD-10-CM

## 2012-04-15 DIAGNOSIS — I10 Essential (primary) hypertension: Secondary | ICD-10-CM

## 2012-04-15 DIAGNOSIS — R040 Epistaxis: Secondary | ICD-10-CM

## 2012-04-15 DIAGNOSIS — F17200 Nicotine dependence, unspecified, uncomplicated: Secondary | ICD-10-CM

## 2012-04-15 MED ORDER — CARVEDILOL 3.125 MG PO TABS: 3.1250 mg | ORAL_TABLET | Freq: Two times a day (BID) | ORAL | Status: DC

## 2012-04-15 NOTE — Assessment & Plan Note (Addendum)
Dr. Sanjuana Kava status patient with me and agrees the patient can be off Plavix and aspirin given his recent severe epistasis with hemorrhagic shock. Eventually he would like him to go back on a baby aspirin once he is cleared by ENT.May take Iron sulfate 325 mg t.i.d. With food for anemia.

## 2012-04-15 NOTE — Assessment & Plan Note (Signed)
Stable without chest pain 

## 2012-04-15 NOTE — Patient Instructions (Addendum)
Your physician has recommended you make the following change in your medication: Increase your Carvedilol to 3.125 twice daily.  Start Iron Sulfate 325 mg three times daily with meals.  STOP ASA AND PLAVIX  Keep your follow up with Dr. Clifton James in August.

## 2012-04-15 NOTE — Assessment & Plan Note (Signed)
Blood pressure is creeping up. I will increase his Coreg to 3.125 mg one tablet b.i.d. I will not restart amlodipine today. He has followup in August at which time he can have blood work and possibly restart amlodipine if necessary. Patient will check his blood pressure home.

## 2012-04-15 NOTE — Assessment & Plan Note (Signed)
Discuss smoking cessation with the patient.

## 2012-04-15 NOTE — Progress Notes (Signed)
HPI: This is a 56 year old white male patient who has a history of coronary artery disease status post drug-eluting stents to the LAD in December 2011. He also has a 70% RCA and 70% circumflex that was not treated. Repeat cardiac catheterization March 12, 2011 showed patent LAD stent and moderate disease in the RCA and OM 2.  The patient had 3 recent admissions with severe epistasis causing hemorrhagic shock with blood loss tachycardia and drop in blood pressure. His treated with packing and transfusion. His amlodipine was stopped and her rate decreased. His Plavix and aspirin have been stopped. Dr. Suszanne Conners had to cauterize his nose again yesterday. They're hoping we will him to be Plavix and aspirin.  The patient states he feels better than ever since a lot of his medications have been stopped. He denies any chest pain, palpitations, and dyspnea, dizziness, or presyncope. He continues to smoke one pack of cigarettes every other day.     Allergies:  -- Penicillins -- Other (See Comments)   --  unknown  Current Outpatient Prescriptions on File Prior to Visit: carvedilol (COREG) 3.125 MG tablet, Take 1/2 tablet by mouth twice daily., Disp: 60 tablet, Rfl: 8 clindamycin (CLEOCIN) 300 MG capsule, Take 1 capsule (300 mg total) by mouth 3 (three) times daily., Disp: 30 capsule, Rfl: 0 Multiple Vitamin (MULTIVITAMIN) capsule, Take 1 capsule by mouth daily.  , Disp: , Rfl:  nitroGLYCERIN (NITROSTAT) 0.4 MG SL tablet, Place 1 tablet (0.4 mg total) under the tongue every 5 (five) minutes as needed for chest pain. 1 tablet under tongue at onset of chest pain, you may repeat every 5 minutes for up to 3 doses., Disp: 25 tablet, Rfl: 6 rosuvastatin (CRESTOR) 40 MG tablet, Take one tablet by mouth once daily, Disp: 90 tablet, Rfl: 3    Past Medical History:   HYPERLIPIDEMIA-MIXED                            10/04/2010    TOBACCO ABUSE                                   10/04/2010    HYPERTENSION, BENIGN                             10/04/2010    CAD, NATIVE VESSEL                              10/04/2010    Alcohol abuse                                   12/27/2010     Allergic rhinitis, cause unspecified            12/27/2010     Stented coronary artery                                      Anal warts                                      04/11/2012  Past Surgical History:   finger trauma                                                  Comment:almost cut off   VASECTOMY                                                   Review of patient's family history indicates:   Heart disease                  Father                   Brain cancer                   Mother                   Social History   Marital Status: Single              Spouse Name:                      Years of Education:                 Number of children:             Occupational History Occupation          Landscape architect*                     [employer]  Social History Main Topics   Smoking Status: Current Everyday Smoker         Packs/Day: 4     Years: 25        Types: Cigarettes   Smokeless Status: Never Used                       Comment: 2 plus packs per day. He has a 100 + pack -             year history of tobacco abuse currently.             Former 4 ppd for 25 years.   Alcohol Use: Yes           25.2 oz/week      42 Cans of beer per week      Comment: He drinks a 6 -12 pack of beer per night   Drug Use: No             Sexual Activity: Yes                    Birth Control/Protection: None  Other Topics            Concern   None on file  Social History Narrative   The patient lives in [place] with his girlfriend. 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    ROS:see history of present illness otherwise negative   PHYSICAL EXAM: Well-nournished,  in no acute distress. Neck: No JVD, HJR, Bruit, or thyroid enlargement  Lungs: No tachypnea, clear  without wheezing, rales, or rhonchi  Cardiovascular: RRR, PMI not displaced, heart sounds normal, no murmurs, gallops, bruit, thrill, or heave.  Abdomen: BS normal. Soft without organomegaly, masses, lesions or tenderness.  Extremities: without cyanosis, clubbing or edema. Good distal pulses bilateral  SKin: Warm, no lesions or rashes   Musculoskeletal: No deformities  Neuro: no focal signs  BP 132/72  Pulse 84  Ht 5\' 11"  (1.803 m)  Wt 172 lb 8 oz (78.245 kg)  BMI 24.06 kg/m2   ZOX:WRUEAV sinus rhythm normal EKG

## 2012-05-11 ENCOUNTER — Ambulatory Visit (INDEPENDENT_AMBULATORY_CARE_PROVIDER_SITE_OTHER): Payer: BC Managed Care – PPO | Admitting: Cardiovascular Disease

## 2012-05-11 ENCOUNTER — Encounter: Payer: Self-pay | Admitting: Cardiovascular Disease

## 2012-05-11 VITALS — BP 120/81 | HR 78 | Resp 12

## 2012-05-11 DIAGNOSIS — I2510 Atherosclerotic heart disease of native coronary artery without angina pectoris: Secondary | ICD-10-CM

## 2012-05-11 LAB — CBC WITH DIFFERENTIAL/PLATELET
Basophils Absolute: 0.1 10*3/uL (ref 0.0–0.1)
Eosinophils Relative: 1.5 % (ref 0.0–5.0)
Monocytes Absolute: 0.7 10*3/uL (ref 0.1–1.0)
Monocytes Relative: 9.5 % (ref 3.0–12.0)
Neutrophils Relative %: 61.3 % (ref 43.0–77.0)
Platelets: 201 10*3/uL (ref 150.0–400.0)
RDW: 15.2 % — ABNORMAL HIGH (ref 11.5–14.6)
WBC: 7.4 10*3/uL (ref 4.5–10.5)

## 2012-05-11 NOTE — Patient Instructions (Signed)
Your physician wants you to follow-up in:  6 months. You will receive a reminder letter in the mail two months in advance. If you don't receive a letter, please call our office to schedule the follow-up appointment.   

## 2012-05-11 NOTE — Progress Notes (Signed)
History of Present Illness: 56 yo WM with history of tobacco abuse, CAD , HTN and hyperlipidemia admitted to [facility] 09/16/10 with chest pain. He ruled in for a NSTEMI with cardiac enzymes. He underwent diagnostic heart cath on 09/17/10 and was found to have a severe stenosis of the LAD. Dr. Riley Kill placed a drug eluting stent in the LAD. He was also found to have moderate disease in the RCA (70%) and Circumflex (70%) but this was not treated. I saw him in January for hospital follow up and again in April for follow up. He came back in May 2012 and described overall fatigue, feeling constantly tired, some dyspnea as well as chest pain, left sided and occurs almost daily. I arranged a repeat cardiac cath on March 12, 2011 which showed patent LAD stent and moderate disease in the RCA and OM2. I flow wired the RCA and it was not flow limiting. FFR was 0. 97. I saw in the office last in February 2013 and he was doing well without recurrent chest pain. The patient had 3 recent admissions in July 2013 with severe epistasis causing hemorrhagic shock with blood loss tachycardia and drop in blood pressure. His treated with packing and transfusion. His amlodipine was stopped and her rate decreased. His Plavix and aspirin have been stopped.  He is here today for follow up. He has had no chest pain. No trouble breathing. 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 He does not wish to stop.   Primary Care Physician: Dr. Oliver Barre  Last Lipid Profile:  Lipid Panel     Component Value Date/Time   CHOL 127 02/07/2012 0820   TRIG 111.0 02/07/2012 0820   HDL 53.40 02/07/2012 0820   CHOLHDL 2 02/07/2012 0820   VLDL 22.2 02/07/2012 0820   LDLCALC 51 02/07/2012 0820     Past Medical History  Diagnosis Date  . HYPERLIPIDEMIA-MIXED 10/04/2010  . TOBACCO ABUSE 10/04/2010  . HYPERTENSION, BENIGN 10/04/2010  . CAD, NATIVE VESSEL 10/04/2010  . Alcohol abuse 12/27/2010  . Allergic rhinitis, cause  unspecified 12/27/2010  . Stented coronary artery   . Anal warts 04/11/2012    Past Surgical History  Procedure Date  . Finger trauma     almost cut off  . Vasectomy     Current Outpatient Prescriptions  Medication Sig Dispense Refill  . carvedilol (COREG) 3.125 MG tablet Take 1 tablet (3.125 mg total) by mouth 2 (two) times daily with a meal.  60 tablet  8  . Multiple Vitamin (MULTIVITAMIN) capsule Take 1 capsule by mouth daily.        . nitroGLYCERIN (NITROSTAT) 0.4 MG SL tablet Place 1 tablet (0.4 mg total) under the tongue every 5 (five) minutes as needed for chest pain. 1 tablet under tongue at onset of chest pain, you may repeat every 5 minutes for up to 3 doses.  25 tablet  6  . rosuvastatin (CRESTOR) 40 MG tablet Take one tablet by mouth once daily  90 tablet  3  . oxyCODONE-acetaminophen (PERCOCET/ROXICET) 5-325 MG per tablet         Allergies  Allergen Reactions  . Penicillins Other (See Comments)    unknown    History   Social History  . Marital Status: Single    Spouse Name: N/A    Number of Children: N/A  . Years of Education: N/A   Occupational History  . Electrical maintenance work      [employer]  Social History Main Topics  . Smoking status: Current Everyday Smoker -- 4.0 packs/day for 25 years    Types: Cigarettes  . Smokeless tobacco: Never Used   Comment: 2 plus packs per day. He has a 100 + pack - year history of tobacco abuse currently. Former 4 ppd for 25 years.  . Alcohol Use: 25.2 oz/week    42 Cans of beer per week     He drinks a 6 -12 pack of beer per night  . Drug Use: No  . Sexually Active: Yes    Birth Control/ Protection: None   Other Topics Concern  . Not on file   Social History Narrative   The patient lives in [place] with his girlfriend. 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    Family History  Problem Relation Age of Onset  . Heart disease Father   . Brain cancer Mother     Review of Systems:   As stated in the HPI and otherwise negative.   BP 120/81  Pulse 78  Resp 12  Physical Examination: General: Well developed, well nourished, NAD HEENT: OP clear, mucus membranes moist SKIN: warm, dry. No rashes. Neuro: No focal deficits Musculoskeletal: Muscle strength 5/5 all ext Psychiatric: Mood and affect normal Neck: No JVD, no carotid bruits, no thyromegaly, no lymphadenopathy. Lungs:Clear bilaterally, no wheezes, rhonci, crackles Cardiovascular: Regular rate and rhythm. No murmurs, gallops or rubs. Abdomen:Soft. Bowel sounds present. Non-tender.  Extremities: No lower extremity edema. Pulses are 2 + in the bilateral DP/PT.

## 2012-05-11 NOTE — Assessment & Plan Note (Signed)
Stable. I have advised him to stop smoking. Will resume ASA 81 mg po qdaily when ok with ENT. Continue beta blocker and statin. Will not restart Plavix. Lipids are well controlled. BP is well controlled.

## 2012-05-13 ENCOUNTER — Other Ambulatory Visit: Payer: Self-pay | Admitting: *Deleted

## 2012-11-10 ENCOUNTER — Encounter: Payer: Self-pay | Admitting: Cardiovascular Disease

## 2012-11-10 ENCOUNTER — Ambulatory Visit (INDEPENDENT_AMBULATORY_CARE_PROVIDER_SITE_OTHER): Payer: BC Managed Care – PPO | Admitting: Cardiovascular Disease

## 2012-11-10 VITALS — BP 130/80 | HR 75 | Ht 71.0 in | Wt 175.0 lb

## 2012-11-10 DIAGNOSIS — Z720 Tobacco use: Secondary | ICD-10-CM

## 2012-11-10 DIAGNOSIS — I2510 Atherosclerotic heart disease of native coronary artery without angina pectoris: Secondary | ICD-10-CM

## 2012-11-10 DIAGNOSIS — F17200 Nicotine dependence, unspecified, uncomplicated: Secondary | ICD-10-CM

## 2012-11-10 NOTE — Patient Instructions (Addendum)
Your physician wants you to follow-up in:  6 months. You will receive a reminder letter in the mail two months in advance. If you don't receive a letter, please call our office to schedule the follow-up appointment.   

## 2012-11-10 NOTE — Progress Notes (Signed)
History of Present Illness: 57 yo WM with history of tobacco abuse, CAD , HTN and hyperlipidemia admitted to [facility] 09/16/10 with chest pain. He ruled in for a NSTEMI with cardiac enzymes. He underwent diagnostic heart cath on 09/17/10 and was found to have a severe stenosis of the LAD. Dr. Riley Kill placed a drug eluting stent in the LAD. He was also found to have moderate disease in the RCA (70%) and Circumflex (70%) but this was not treated. I saw him in January for hospital follow up and again in April for follow up. He came back in May 2012 and described overall fatigue, feeling constantly tired, some dyspnea as well as chest pain, left sided and occurs almost daily. I arranged a repeat cardiac cath on March 12, 2011 which showed patent LAD stent and moderate disease in the RCA and OM2. I flow wired the RCA and it was not flow limiting. FFR was 0. 97. I saw in the office last in February 2013 and he was doing well without recurrent chest pain. The patient had 3 recent admissions in July 2013 with severe epistasis causing hemorrhagic shock with blood loss tachycardia and drop in blood pressure. His treated with packing and transfusion. His amlodipine was stopped and her rate decreased. His Plavix and aspirin have been stopped.   He is here today for follow up. He has had no chest pain. No trouble breathing. 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 He does not wish to stop.   Primary Care Physician: Dr. Oliver Barre  Last Lipid Profile:Lipid Panel     Component Value Date/Time   CHOL 127 02/07/2012 0820   TRIG 111.0 02/07/2012 0820   HDL 53.40 02/07/2012 0820   CHOLHDL 2 02/07/2012 0820   VLDL 22.2 02/07/2012 0820   LDLCALC 51 02/07/2012 0820     Past Medical History  Diagnosis Date  . HYPERLIPIDEMIA-MIXED 10/04/2010  . TOBACCO ABUSE 10/04/2010  . HYPERTENSION, BENIGN 10/04/2010  . CAD, NATIVE VESSEL 10/04/2010  . Alcohol abuse 12/27/2010  . Allergic rhinitis, cause  unspecified 12/27/2010  . Stented coronary artery   . Anal warts 04/11/2012    Past Surgical History  Procedure Laterality Date  . Finger trauma      almost cut off  . Vasectomy      Current Outpatient Prescriptions  Medication Sig Dispense Refill  . carvedilol (COREG) 3.125 MG tablet Take 1 tablet (3.125 mg total) by mouth 2 (two) times daily with a meal.  60 tablet  8  . Multiple Vitamin (MULTIVITAMIN) capsule Take 1 capsule by mouth daily.        . nitroGLYCERIN (NITROSTAT) 0.4 MG SL tablet Place 1 tablet (0.4 mg total) under the tongue every 5 (five) minutes as needed for chest pain. 1 tablet under tongue at onset of chest pain, you may repeat every 5 minutes for up to 3 doses.  25 tablet  6  . oxyCODONE-acetaminophen (PERCOCET/ROXICET) 5-325 MG per tablet       . rosuvastatin (CRESTOR) 40 MG tablet Take one tablet by mouth once daily  90 tablet  3   No current facility-administered medications for this visit.    Allergies  Allergen Reactions  . Penicillins Other (See Comments)    unknown    History   Social History  . Marital Status: Single    Spouse Name: N/A    Number of Children: N/A  . Years of Education: N/A   Occupational History  .  Electrical maintenance work      [employer]   Social History Main Topics  . Smoking status: Current Every Day Smoker -- 4.00 packs/day for 25 years    Types: Cigarettes  . Smokeless tobacco: Never Used     Comment: 2 plus packs per day. He has a 100 + pack - year history of tobacco abuse currently. Former 4 ppd for 25 years.  . Alcohol Use: 25.2 oz/week    42 Cans of beer per week     Comment: He drinks a 6 -12 pack of beer per night  . Drug Use: No  . Sexually Active: Yes    Birth Control/ Protection: None   Other Topics Concern  . Not on file   Social History Narrative   The patient lives in [place] with his girlfriend. 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    Family History  Problem  Relation Age of Onset  . Heart disease Father   . Brain cancer Mother     Review of Systems:  As stated in the HPI and otherwise negative.   BP 130/80  Pulse 75  Ht 5\' 11"  (1.803 m)  Wt 175 lb (79.379 kg)  BMI 24.42 kg/m2  Physical Examination: General: Well developed, well nourished, NAD HEENT: OP clear, mucus membranes moist SKIN: warm, dry. No rashes. Neuro: No focal deficits Musculoskeletal: Muscle strength 5/5 all ext Psychiatric: Mood and affect normal Neck: No JVD, no carotid bruits, no thyromegaly, no lymphadenopathy. Lungs:Clear bilaterally, no wheezes, rhonci, crackles Cardiovascular: Regular rate and rhythm. No murmurs, gallops or rubs. Abdomen:Soft. Bowel sounds present. Non-tender.  Extremities: No lower extremity edema. Pulses are 2 + in the bilateral DP/PT.  Assessment and Plan:   1. CAD: Stable. Continue ASA, beta blocker and statin. Plavix was stopped in July 2013 after he had severe epistaxis. Lipids and BP are well controlled.   2. Tobacco abuse: He is advised to stop smoking. 10 minutes counseling on cessation.

## 2012-11-30 ENCOUNTER — Telehealth: Payer: Self-pay | Admitting: Cardiovascular Disease

## 2012-11-30 DIAGNOSIS — I10 Essential (primary) hypertension: Secondary | ICD-10-CM

## 2012-11-30 DIAGNOSIS — I2510 Atherosclerotic heart disease of native coronary artery without angina pectoris: Secondary | ICD-10-CM

## 2012-11-30 DIAGNOSIS — E782 Mixed hyperlipidemia: Secondary | ICD-10-CM

## 2012-11-30 MED ORDER — CARVEDILOL 3.125 MG PO TABS: 3.1250 mg | ORAL_TABLET | Freq: Two times a day (BID) | ORAL | Status: DC

## 2012-11-30 MED ORDER — ROSUVASTATIN CALCIUM 40 MG PO TABS: ORAL_TABLET | ORAL | Status: DC

## 2012-11-30 NOTE — Telephone Encounter (Signed)
Patient would like for you to call in/escribe his Crestor 40 mg (mail order) and his carvedilol 3.125 mg to [facility] [address]

## 2012-11-30 NOTE — Telephone Encounter (Signed)
Spoke with Eunice Blase and confirmed Crestor should be sent to [facility] (90 day supply) and Carvedilol should be sent to [facility] on [address] (30 day supply). Will send in.

## 2013-01-06 ENCOUNTER — Telehealth: Payer: Self-pay | Admitting: Cardiovascular Disease

## 2013-01-06 NOTE — Telephone Encounter (Signed)
New problem   Pt went to dentist and had some teeth extracted today and they told him to take 3 Advil 3 times a day. Pt want to know if it's ok for him to take this amount of Advil because of the other medication he is taking. Please call

## 2013-01-06 NOTE — Telephone Encounter (Signed)
Reviewed with Weston Brass, PharmD and it should be OK to take Advil short term following tooth extraction. I spoke with pt's girlfriend and gave her this information. Pt is only going to take for the next 3 days after tooth extraction.

## 2013-05-14 ENCOUNTER — Telehealth: Payer: Self-pay | Admitting: Cardiovascular Disease

## 2013-05-14 NOTE — Telephone Encounter (Signed)
New problem   Pt need some nitro pills because his old ones has changed colors. A new prescription can be called in to [facility]/[address] phone [phone]

## 2013-05-17 ENCOUNTER — Other Ambulatory Visit: Payer: Self-pay

## 2013-05-17 DIAGNOSIS — I2510 Atherosclerotic heart disease of native coronary artery without angina pectoris: Secondary | ICD-10-CM

## 2013-05-17 MED ORDER — NITROGLYCERIN 0.4 MG SL SUBL: 0.4000 mg | SUBLINGUAL_TABLET | SUBLINGUAL | Status: DC | PRN

## 2013-05-25 ENCOUNTER — Other Ambulatory Visit: Payer: Self-pay | Admitting: *Deleted

## 2013-05-25 ENCOUNTER — Other Ambulatory Visit: Payer: Self-pay

## 2013-05-25 DIAGNOSIS — I2510 Atherosclerotic heart disease of native coronary artery without angina pectoris: Secondary | ICD-10-CM

## 2013-05-25 MED ORDER — NITROGLYCERIN 0.4 MG SL SUBL: 0.4000 mg | SUBLINGUAL_TABLET | SUBLINGUAL | Status: DC | PRN

## 2013-05-26 ENCOUNTER — Ambulatory Visit: Payer: BC Managed Care – PPO | Admitting: Cardiovascular Disease

## 2013-06-25 ENCOUNTER — Encounter: Payer: Self-pay | Admitting: Cardiovascular Disease

## 2013-06-25 ENCOUNTER — Ambulatory Visit (INDEPENDENT_AMBULATORY_CARE_PROVIDER_SITE_OTHER): Payer: BC Managed Care – PPO | Admitting: Cardiovascular Disease

## 2013-06-25 VITALS — BP 129/81 | HR 76 | Wt 175.0 lb

## 2013-06-25 DIAGNOSIS — Z720 Tobacco use: Secondary | ICD-10-CM

## 2013-06-25 DIAGNOSIS — F17200 Nicotine dependence, unspecified, uncomplicated: Secondary | ICD-10-CM

## 2013-06-25 DIAGNOSIS — I2510 Atherosclerotic heart disease of native coronary artery without angina pectoris: Secondary | ICD-10-CM

## 2013-06-25 DIAGNOSIS — I10 Essential (primary) hypertension: Secondary | ICD-10-CM

## 2013-06-25 NOTE — Patient Instructions (Signed)
Your physician wants you to follow-up in:  6 months. You will receive a reminder letter in the mail two months in advance. If you don't receive a letter, please call our office to schedule the follow-up appointment.   

## 2013-06-25 NOTE — Progress Notes (Signed)
History of Present Illness: 57 yo [name] with history of tobacco abuse, CAD , HTN and hyperlipidemia admitted to [facility] 09/16/10 with chest pain. He ruled in for a NSTEMI with cardiac enzymes. He underwent diagnostic heart cath on 09/17/10 and was found to have a severe stenosis of the LAD. Dr. Riley Kill placed a drug eluting stent in the LAD. He was also found to have moderate disease in the RCA (70%) and Circumflex (70%) but this was not treated. I saw him in January for hospital follow up and again in April for follow up. He came back in May 2012 and described overall fatigue, feeling constantly tired, some dyspnea as well as chest pain, left sided and occurs almost daily. I arranged a repeat cardiac cath on March 12, 2011 which showed patent LAD stent and moderate disease in the RCA and OM2. I flow wired the RCA and it was not flow limiting. FFR was 0. 97. The patient had 3 admissions in July 2013 with severe epistasis causing hemorrhagic shock with blood loss tachycardia and drop in blood pressure. This was treated with packing and transfusion. His amlodipine was stopped and her rate decreased. His Plavix and aspirin have been stopped.   He is here today for follow up. He has had no chest pain. No trouble breathing. He continues to smoke 3 ppd. He does not wish to stop.   Primary Care Physician: Dr. [name]  Last Lipid Profile:Lipid Panel     Component Value Date/Time   CHOL 127 02/07/2012 0820   TRIG 111.0 02/07/2012 0820   HDL 53.40 02/07/2012 0820   CHOLHDL 2 02/07/2012 0820   VLDL 22.2 02/07/2012 0820   LDLCALC 51 02/07/2012 0820     Past Medical History  Diagnosis Date  . HYPERLIPIDEMIA-MIXED 10/04/2010  . TOBACCO ABUSE 10/04/2010  . HYPERTENSION, BENIGN 10/04/2010  . CAD, NATIVE VESSEL 10/04/2010  . Alcohol abuse 12/27/2010  . Allergic rhinitis, cause unspecified 12/27/2010  . Stented coronary artery   . Anal warts 04/11/2012    Past Surgical History  Procedure Laterality Date   . Finger trauma      almost cut off  . Vasectomy      Current Outpatient Prescriptions  Medication Sig Dispense Refill  . aspirin EC 81 MG tablet Take 81 mg by mouth daily.      . carvedilol (COREG) 3.125 MG tablet Take 1 tablet (3.125 mg total) by mouth 2 (two) times daily with a meal.  60 tablet  11  . Multiple Vitamin (MULTIVITAMIN) capsule Take 1 capsule by mouth daily.        . nitroGLYCERIN (NITROSTAT) 0.4 MG SL tablet Place 1 tablet (0.4 mg total) under the tongue every 5 (five) minutes as needed for chest pain. 1 tablet under tongue at onset of chest pain, you may repeat every 5 minutes for up to 3 doses.  25 tablet  6  . rosuvastatin (CRESTOR) 40 MG tablet Take one tablet by mouth once daily  90 tablet  3   No current facility-administered medications for this visit.    Allergies  Allergen Reactions  . Penicillins Other (See Comments)    unknown    History   Social History  . Marital Status: Single    Spouse Name: N/A    Number of Children: N/A  . Years of Education: N/A   Occupational History  . Electrical maintenance work      [employer]   Social History Main Topics  .  Smoking status: Current Every Day Smoker -- 4.00 packs/day for 25 years    Types: Cigarettes  . Smokeless tobacco: Never Used     Comment: 2 plus packs per day. He has a 100 + pack - year history of tobacco abuse currently. Former 4 ppd for 25 years.  . Alcohol Use: 25.2 oz/week    42 Cans of beer per week     Comment: He drinks a 6 -12 pack of beer per night  . Drug Use: No  . Sexual Activity: Yes    Birth Control/ Protection: None   Other Topics Concern  . Not on file   Social History Narrative   The patient lives in [name] with his girlfriend. 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    Family History  Problem Relation Age of Onset  . Heart disease Father   . Brain cancer Mother     Review of Systems:  As stated in the HPI and otherwise negative.     BP 129/81  Pulse 76  Wt 175 lb (John.379 kg)  BMI 24.42 kg/m2  Physical Examination: General: Well developed, well nourished, NAD HEENT: OP clear, mucus membranes moist SKIN: warm, dry. No rashes. Neuro: No focal deficits Musculoskeletal: Muscle strength 5/5 all ext Psychiatric: Mood and affect normal Neck: No JVD, no carotid bruits, no thyromegaly, no lymphadenopathy. Lungs:Clear bilaterally, no wheezes, rhonci, crackles Cardiovascular: Regular rate and rhythm. No murmurs, gallops or rubs. Abdomen:Soft. Bowel sounds present. Non-tender.  Extremities: No lower extremity edema. Pulses are 2 + in the bilateral DP/PT.  Assessment and Plan:   1. CAD: Stable. Continue ASA, beta blocker and statin. Plavix was stopped in July 2013 after he had severe epistaxis. Lipids and BP are well controlled.   2. Tobacco abuse: He is advised to stop smoking. 10 minutes counseling on cessation.  3. HTN: BP controlled. No changes today.

## 2013-08-26 ENCOUNTER — Telehealth: Payer: Self-pay | Admitting: Cardiovascular Disease

## 2013-08-26 DIAGNOSIS — E785 Hyperlipidemia, unspecified: Secondary | ICD-10-CM

## 2013-08-26 NOTE — Telephone Encounter (Signed)
New message     Ins co will not longer pay for crestor beginning jan 1---She has a list of meds that they will pay for and a form to be completed.  Pls call so that they can get this fixed before the end of the year.

## 2013-08-30 NOTE — Telephone Encounter (Signed)
Spoke with pt's girlfriend. After January 1,2015 the amount pt has to pay for Crestor will go up quite a bit. She is asking for an alternative. Insurance has suggested Atorvastatin, simvastatin, pravastatin and lovastatin as possible alternatives.  Pt has never tried any of these.Marland Kitchen He has tolerated current dose of Crestor 40 mg without problems.

## 2013-08-31 MED ORDER — ATORVASTATIN CALCIUM 80 MG PO TABS: 80.0000 mg | ORAL_TABLET | Freq: Every day | ORAL | Status: DC

## 2013-08-31 NOTE — Telephone Encounter (Signed)
We can change to atorvastatin 80 mg po QHS. Thanks, chris

## 2013-08-31 NOTE — Telephone Encounter (Signed)
Left message to call back  

## 2013-08-31 NOTE — Telephone Encounter (Signed)
Spoke with pt's girlfriend and gave her information from Dr. Clifton James. Pt will finish supply of Crestor he currently has and then switch to Atorvastatin. [facility]

## 2013-12-21 ENCOUNTER — Other Ambulatory Visit: Payer: Self-pay

## 2013-12-21 DIAGNOSIS — I10 Essential (primary) hypertension: Secondary | ICD-10-CM

## 2013-12-21 DIAGNOSIS — I2510 Atherosclerotic heart disease of native coronary artery without angina pectoris: Secondary | ICD-10-CM

## 2013-12-21 MED ORDER — CARVEDILOL 3.125 MG PO TABS: 3.1250 mg | ORAL_TABLET | Freq: Two times a day (BID) | ORAL | Status: DC

## 2013-12-24 ENCOUNTER — Encounter: Payer: Self-pay | Admitting: Cardiovascular Disease

## 2013-12-24 ENCOUNTER — Ambulatory Visit (INDEPENDENT_AMBULATORY_CARE_PROVIDER_SITE_OTHER): Payer: BC Managed Care – PPO | Admitting: Cardiovascular Disease

## 2013-12-24 VITALS — BP 124/81 | HR 79 | Ht 71.0 in | Wt 170.1 lb

## 2013-12-24 DIAGNOSIS — I2510 Atherosclerotic heart disease of native coronary artery without angina pectoris: Secondary | ICD-10-CM

## 2013-12-24 DIAGNOSIS — F17200 Nicotine dependence, unspecified, uncomplicated: Secondary | ICD-10-CM

## 2013-12-24 DIAGNOSIS — E785 Hyperlipidemia, unspecified: Secondary | ICD-10-CM

## 2013-12-24 DIAGNOSIS — I10 Essential (primary) hypertension: Secondary | ICD-10-CM

## 2013-12-24 LAB — LIPID PANEL
Cholesterol: 137 mg/dL (ref 0–200)
HDL: 51 mg/dL (ref >39)
LDL CALC: 50 mg/dL (ref 0–99)
Total CHOL/HDL Ratio: 2.7 Ratio
Triglycerides: 182 mg/dL — ABNORMAL HIGH (ref <150)
VLDL: 36 mg/dL (ref 0–40)

## 2013-12-24 LAB — HEPATIC FUNCTION PANEL
ALBUMIN: 4.1 g/dL (ref 3.5–5.2)
ALK PHOS: 121 U/L — AB (ref 39–117)
ALT: 18 U/L (ref 0–53)
AST: 21 U/L (ref 0–37)
BILIRUBIN DIRECT: 0.1 mg/dL (ref 0.0–0.3)
BILIRUBIN TOTAL: 0.4 mg/dL (ref 0.2–1.2)
Indirect Bilirubin: 0.3 mg/dL (ref 0.2–1.2)
Total Protein: 6.4 g/dL (ref 6.0–8.3)

## 2013-12-24 NOTE — Patient Instructions (Signed)
Your physician wants you to follow-up in:  6 months. You will receive a reminder letter in the mail two months in advance. If you don't receive a letter, please call our office to schedule the follow-up appointment.   

## 2013-12-24 NOTE — Progress Notes (Signed)
History of Present Illness: 58 yo WM with history of tobacco abuse, CAD , HTN and hyperlipidemia admitted to [facility] 09/16/10 with chest pain. He ruled in for a NSTEMI with cardiac enzymes. He underwent diagnostic heart cath on 09/17/10 and was found to have a severe stenosis of the LAD. Dr. Riley KillStuckey placed a drug eluting stent in the LAD. He was also found to have moderate disease in the RCA (70%) and Circumflex (70%) managed medically. He came back in May 2012 and described overall fatigue, feeling constantly tired, some dyspnea as well as chest pain, left sided and occurring almost daily. I arranged a repeat cardiac cath on March 12, 2011 which showed patent LAD stent and moderate disease in the RCA and OM2. I flow wired the RCA and it was not flow limiting. FFR was 0. 97. The patient had 3 admissions in July 2013 with severe epistaxis causing hemorrhagic shock with blood loss tachycardia and drop in blood pressure. This was treated with packing and transfusion. His amlodipine was stopped and her rate decreased. His Plavix was stopped.   He is here today for follow up. He has had no chest pain.  He is smoking 2ppd. Some SOB.    Primary Care Physician: Dr. Oliver BarreJames John  Last Lipid Profile:Lipid Panel     Component Value Date/Time   CHOL 127 02/07/2012 0820   TRIG 111.0 02/07/2012 0820   HDL 53.40 02/07/2012 0820   CHOLHDL 2 02/07/2012 0820   VLDL 22.2 02/07/2012 0820   LDLCALC 51 02/07/2012 0820     Past Medical History  Diagnosis Date  . HYPERLIPIDEMIA-MIXED 10/04/2010  . TOBACCO ABUSE 10/04/2010  . HYPERTENSION, BENIGN 10/04/2010  . CAD, NATIVE VESSEL 10/04/2010  . Alcohol abuse 12/27/2010  . Allergic rhinitis, cause unspecified 12/27/2010  . Stented coronary artery   . Anal warts 04/11/2012    Past Surgical History  Procedure Laterality Date  . Finger trauma      almost cut off  . Vasectomy      Current Outpatient Prescriptions  Medication Sig Dispense Refill  . aspirin EC  81 MG tablet Take 81 mg by mouth daily.      Marland Kitchen. atorvastatin (LIPITOR) 80 MG tablet Take 1 tablet (80 mg total) by mouth daily.  90 tablet  3  . carvedilol (COREG) 3.125 MG tablet Take 1 tablet (3.125 mg total) by mouth 2 (two) times daily with a meal.  60 tablet  6  . Multiple Vitamin (MULTIVITAMIN) capsule Take 1 capsule by mouth daily.        . nitroGLYCERIN (NITROSTAT) 0.4 MG SL tablet Place 1 tablet (0.4 mg total) under the tongue every 5 (five) minutes as needed for chest pain. 1 tablet under tongue at onset of chest pain, you may repeat every 5 minutes for up to 3 doses.  25 tablet  6   No current facility-administered medications for this visit.    Allergies  Allergen Reactions  . Penicillins Other (See Comments)    unknown    History   Social History  . Marital Status: Single    Spouse Name: N/A    Number of Children: N/A  . Years of Education: N/A   Occupational History  . Electrical maintenance work      [employer]   Social History Main Topics  . Smoking status: Current Every Day Smoker -- 4.00 packs/day for 25 years    Types: Cigarettes  . Smokeless tobacco: Never Used  Comment: 2 plus packs per day. He has a 100 + pack - year history of tobacco abuse currently. Former 4 ppd for 25 years.  . Alcohol Use: 25.2 oz/week    42 Cans of beer per week     Comment: He drinks a 6 -12 pack of beer per night  . Drug Use: No  . Sexual Activity: Yes    Birth Control/ Protection: None   Other Topics Concern  . Not on file   Social History Narrative   The patient lives in [place] with his girlfriend. 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    Family History  Problem Relation Age of Onset  . Heart disease Father   . Brain cancer Mother     Review of Systems:  As stated in the HPI and otherwise negative.   BP 124/81  Pulse 79  Ht 5\' 11"  (1.803 m)  Wt 170 lb 1.9 oz (77.166 kg)  BMI 23.74 kg/m2  Physical Examination: General: Well  developed, well nourished, NAD HEENT: OP clear, mucus membranes moist SKIN: warm, dry. No rashes. Neuro: No focal deficits Musculoskeletal: Muscle strength 5/5 all ext Psychiatric: Mood and affect normal Neck: No JVD, no carotid bruits, no thyromegaly, no lymphadenopathy. Lungs:Clear bilaterally, no wheezes, rhonci, crackles Cardiovascular: Regular rate and rhythm. No murmurs, gallops or rubs. Abdomen:Soft. Bowel sounds present. Non-tender.  Extremities: No lower extremity edema. Pulses are 2 + in the bilateral DP/PT.  Assessment and Plan:   1. CAD: Stable. Continue ASA, beta blocker and statin. Plavix was stopped in July 2013 after he had severe epistaxis.   2. Tobacco abuse: He is advised to stop smoking. 10 minutes counseling on cessation.  3. HTN: BP controlled. No changes today.   4. Hyperlipidemia: Continue statin. Check lipids and LFTs today.

## 2014-01-04 ENCOUNTER — Telehealth: Payer: Self-pay | Admitting: Cardiovascular Disease

## 2014-01-04 NOTE — Telephone Encounter (Signed)
New problem ° ° °Pt returning your call. °

## 2014-01-04 NOTE — Telephone Encounter (Signed)
Spoke with pt and reviewed lab results with him. 

## 2014-01-21 NOTE — Telephone Encounter (Signed)
error 

## 2014-07-13 ENCOUNTER — Ambulatory Visit (INDEPENDENT_AMBULATORY_CARE_PROVIDER_SITE_OTHER): Payer: BC Managed Care – PPO | Admitting: Cardiovascular Disease

## 2014-07-13 ENCOUNTER — Encounter: Payer: Self-pay | Admitting: Cardiovascular Disease

## 2014-07-13 VITALS — BP 136/80 | HR 87 | Ht 71.0 in | Wt 172.0 lb

## 2014-07-13 DIAGNOSIS — I10 Essential (primary) hypertension: Secondary | ICD-10-CM

## 2014-07-13 DIAGNOSIS — F17200 Nicotine dependence, unspecified, uncomplicated: Secondary | ICD-10-CM

## 2014-07-13 DIAGNOSIS — I2510 Atherosclerotic heart disease of native coronary artery without angina pectoris: Secondary | ICD-10-CM

## 2014-07-13 DIAGNOSIS — E782 Mixed hyperlipidemia: Secondary | ICD-10-CM

## 2014-07-13 DIAGNOSIS — Z720 Tobacco use: Secondary | ICD-10-CM

## 2014-07-13 NOTE — Patient Instructions (Signed)
Your physician wants you to follow-up in: 6 months.   You will receive a reminder letter in the mail two months in advance. If you don't receive a letter, please call our office to schedule the follow-up appointment.  Your physician has requested that you have a lexiscan myoview. For further information please visit [email or web address]. Please follow instruction sheet, as given.   

## 2014-07-13 NOTE — Progress Notes (Signed)
History of Present Illness: 58 yo WM with history of tobacco abuse, CAD , HTN and hyperlipidemia admitted to [facility] 09/16/10 with chest pain. He ruled in for a NSTEMI with cardiac enzymes. He underwent diagnostic heart cath on 09/17/10 and was found to have a severe stenosis of the LAD. Dr. Riley KillStuckey placed a drug eluting stent in the LAD. He was also found to have moderate disease in the RCA (70%) and Circumflex (70%) managed medically. He came back in May 2012 and described overall fatigue, feeling constantly tired, some dyspnea as well as chest pain, left sided and occurring almost daily. I arranged a repeat cardiac cath on March 12, 2011 which showed patent LAD stent and moderate disease in the RCA and OM2. I flow wired the RCA and it was not flow limiting. FFR was 0. 97. The patient had 3 admissions in July 2013 with severe epistaxis causing hemorrhagic shock with blood loss tachycardia and drop in blood pressure. This was treated with packing and transfusion. His Plavix was stopped.   He is here today for follow up. He has had no chest pain.  He is smoking 2ppd. He continues to have dyspnea with minimal exertion.   Primary Care Physician: Dr. Oliver BarreJames John  Last Lipid Profile:Lipid Panel     Component Value Date/Time   CHOL 137 12/24/2013 1547   TRIG 182* 12/24/2013 1547   HDL 51 12/24/2013 1547   CHOLHDL 2.7 12/24/2013 1547   VLDL 36 12/24/2013 1547   LDLCALC 50 12/24/2013 1547    Past Medical History  Diagnosis Date  . HYPERLIPIDEMIA-MIXED 10/04/2010  . TOBACCO ABUSE 10/04/2010  . HYPERTENSION, BENIGN 10/04/2010  . CAD, NATIVE VESSEL 10/04/2010  . Alcohol abuse 12/27/2010  . Allergic rhinitis, cause unspecified 12/27/2010  . Stented coronary artery   . Anal warts 04/11/2012    Past Surgical History  Procedure Laterality Date  . Finger trauma      almost cut off  . Vasectomy      Current Outpatient Prescriptions  Medication Sig Dispense Refill  . aspirin EC 81 MG tablet Take 81  mg by mouth daily.      Marland Kitchen. atorvastatin (LIPITOR) 80 MG tablet Take 1 tablet (80 mg total) by mouth daily.  90 tablet  3  . carvedilol (COREG) 3.125 MG tablet Take 1 tablet (3.125 mg total) by mouth 2 (two) times daily with a meal.  60 tablet  6  . Multiple Vitamin (MULTIVITAMIN) capsule Take 1 capsule by mouth daily.        . nitroGLYCERIN (NITROSTAT) 0.4 MG SL tablet Place 1 tablet (0.4 mg total) under the tongue every 5 (five) minutes as needed for chest pain. 1 tablet under tongue at onset of chest pain, you may repeat every 5 minutes for up to 3 doses.  25 tablet  6   No current facility-administered medications for this visit.    Allergies  Allergen Reactions  . Penicillins Other (See Comments)    unknown    History   Social History  . Marital Status: Single    Spouse Name: N/A    Number of Children: N/A  . Years of Education: N/A   Occupational History  . Electrical maintenance work      [employer]   Social History Main Topics  . Smoking status: Current Every Day Smoker -- 4.00 packs/day for 25 years    Types: Cigarettes  . Smokeless tobacco: Never Used     Comment: 2  plus packs per day. He has a 100 + pack - year history of tobacco abuse currently. Former 4 ppd for 25 years.  . Alcohol Use: 25.2 oz/week    42 Cans of beer per week     Comment: He drinks a 6 -12 pack of beer per night  . Drug Use: No  . Sexual Activity: Yes    Birth Control/ Protection: None   Other Topics Concern  . Not on file   Social History Narrative   The patient lives in [place] with his girlfriend. 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    Family History  Problem Relation Age of Onset  . Heart disease Father   . Brain cancer Mother     Review of Systems:  As stated in the HPI and otherwise negative.   BP 136/80  Pulse 87  Ht 5\' 11"  (1.803 m)  Wt 172 lb (78.019 kg)  BMI 24.00 kg/m2  Physical Examination: General: Well developed, well nourished,  NAD HEENT: OP clear, mucus membranes moist SKIN: warm, dry. No rashes. Neuro: No focal deficits Musculoskeletal: Muscle strength 5/5 all ext Psychiatric: Mood and affect normal Neck: No JVD, no carotid bruits, no thyromegaly, no lymphadenopathy. Lungs:Clear bilaterally, no wheezes, rhonci, crackles Cardiovascular: Regular rate and rhythm. No murmurs, gallops or rubs. Abdomen:Soft. Bowel sounds present. Non-tender.  Extremities: No lower extremity edema. Pulses are 2 + in the bilateral DP/PT.  EKG: NSR, RAE. Rate 87 bpm.   Assessment and Plan:   1. CAD: Recent dyspnea. No chest pain. No recent ischemic testing. Known to have moderate RCA and Circumflex disease.  Continue ASA, beta blocker and statin. Plavix was stopped in July 2013 after he had severe epistaxis. Will arrange Lexiscan stress myoview to exclude ischemia. He cannot walk on the treadmill due to limitation by dyspnea. Of note, prior anginal equivalent was chest pain.   2. Tobacco abuse: He is advised to stop smoking. 10 minutes counseling on cessation. He does not wish to stop.   3. HTN: BP controlled. No changes today.   4. Hyperlipidemia: Continue statin. Lipids well controlled.   5. Dyspnea: Likely due to his ongoing tobacco abuse, COPD. I advised him to seek another opinion in primary care. He may need PFTs. He will likely have progression of his lung disease and CAD with ongoing tobacco abuse.

## 2014-07-18 ENCOUNTER — Telehealth: Payer: Self-pay | Admitting: Cardiovascular Disease

## 2014-07-18 DIAGNOSIS — I2510 Atherosclerotic heart disease of native coronary artery without angina pectoris: Secondary | ICD-10-CM

## 2014-07-18 DIAGNOSIS — I10 Essential (primary) hypertension: Secondary | ICD-10-CM

## 2014-07-18 MED ORDER — CARVEDILOL 3.125 MG PO TABS: 3.1250 mg | ORAL_TABLET | Freq: Two times a day (BID) | ORAL | Status: DC

## 2014-07-18 NOTE — Telephone Encounter (Signed)
Spoke with [name] and pt needs prescription sent to [facility] on [address]. Will send in refill.

## 2014-07-18 NOTE — Addendum Note (Signed)
Addended by: Dossie Arbour on: 07/18/2014 04:33 PM   Modules accepted: Orders

## 2014-07-18 NOTE — Telephone Encounter (Signed)
Patient's Wife came into office saying he needed a refill on Carvedilol.

## 2014-09-27 ENCOUNTER — Ambulatory Visit (INDEPENDENT_AMBULATORY_CARE_PROVIDER_SITE_OTHER): Payer: BLUE CROSS/BLUE SHIELD | Admitting: Physician Assistant

## 2014-09-27 ENCOUNTER — Encounter: Payer: Self-pay | Admitting: *Deleted

## 2014-09-27 ENCOUNTER — Other Ambulatory Visit: Payer: Self-pay | Admitting: Physician Assistant

## 2014-09-27 ENCOUNTER — Encounter: Payer: Self-pay | Admitting: Physician Assistant

## 2014-09-27 VITALS — BP 138/62 | HR 85 | Ht 71.0 in | Wt 173.0 lb

## 2014-09-27 DIAGNOSIS — Z720 Tobacco use: Secondary | ICD-10-CM

## 2014-09-27 DIAGNOSIS — Z01812 Encounter for preprocedural laboratory examination: Secondary | ICD-10-CM

## 2014-09-27 DIAGNOSIS — I200 Unstable angina: Secondary | ICD-10-CM

## 2014-09-27 DIAGNOSIS — I10 Essential (primary) hypertension: Secondary | ICD-10-CM

## 2014-09-27 DIAGNOSIS — F17200 Nicotine dependence, unspecified, uncomplicated: Secondary | ICD-10-CM

## 2014-09-27 LAB — CBC WITH DIFFERENTIAL/PLATELET
BASOS ABS: 0.1 10*3/uL (ref 0.0–0.1)
BASOS PCT: 0.7 % (ref 0.0–3.0)
EOS ABS: 0.1 10*3/uL (ref 0.0–0.7)
Eosinophils Relative: 1.5 % (ref 0.0–5.0)
HCT: 47.5 % (ref 39.0–52.0)
Hemoglobin: 15.9 g/dL (ref 13.0–17.0)
LYMPHS PCT: 18.6 % (ref 12.0–46.0)
Lymphs Abs: 1.7 10*3/uL (ref 0.7–4.0)
MCHC: 33.5 g/dL (ref 30.0–36.0)
MCV: 104.9 fl — ABNORMAL HIGH (ref 78.0–100.0)
MONOS PCT: 11 % (ref 3.0–12.0)
Monocytes Absolute: 1 10*3/uL (ref 0.1–1.0)
NEUTROS PCT: 68.2 % (ref 43.0–77.0)
Neutro Abs: 6.3 10*3/uL (ref 1.4–7.7)
Platelets: 205 10*3/uL (ref 150.0–400.0)
RBC: 4.53 Mil/uL (ref 4.22–5.81)
RDW: 13.6 % (ref 11.5–15.5)
WBC: 9.2 10*3/uL (ref 4.0–10.5)

## 2014-09-27 LAB — BASIC METABOLIC PANEL
BUN: 13 mg/dL (ref 6–23)
CALCIUM: 9.4 mg/dL (ref 8.4–10.5)
CHLORIDE: 100 meq/L (ref 96–112)
CO2: 32 meq/L (ref 19–32)
CREATININE: 0.9 mg/dL (ref 0.4–1.5)
GFR: 96.09 mL/min (ref >60.00)
GLUCOSE: 93 mg/dL (ref 70–99)
Potassium: 4 mEq/L (ref 3.5–5.1)
Sodium: 136 mEq/L (ref 135–145)

## 2014-09-27 LAB — TROPONIN I: TNIDX: 0.07 ug/L — AB (ref 0.00–0.06)

## 2014-09-27 LAB — PROTIME-INR
INR: 0.9 ratio (ref 0.8–1.0)
Prothrombin Time: 10.4 s (ref 9.6–13.1)

## 2014-09-27 LAB — APTT: aPTT: 28.7 s (ref 23.4–32.7)

## 2014-09-27 MED ORDER — SODIUM CHLORIDE 0.9 % IV SOLN
INTRAVENOUS | Status: DC
  Administered 2014-09-28: 10:00:00 via INTRAVENOUS

## 2014-09-27 MED ORDER — ISOSORBIDE MONONITRATE ER 30 MG PO TB24: 30.0000 mg | ORAL_TABLET | Freq: Every day | ORAL | Status: DC

## 2014-09-27 NOTE — Assessment & Plan Note (Signed)
Patient comes in today with 3 week history of chest pain worse in early am. Hasn't used Ntg and continues to smoke 2 ppd. Discussed with Dr. Eldridge Dace. Recommend admission to hospital and cardiac cath. Patient is refusing admission date. He will come to the hospital tomorrow for cardiac catheterization scheduled at 1:00. I discussed the risks of this with the patient including MI, heart attack and stroke. I advised the patient to call EMS and proceeded to the emergency room if he has any further chest pain. Advised not to return to work today. Prescribed Imdur 30 mg once daily.

## 2014-09-27 NOTE — Progress Notes (Signed)
     HPI: This is a 58-year-old male patient of Dr. McAlhaney with history of CAD status post an STEMI in 12 2011 treated with drug-eluting stent to the LAD. He had moderate disease in the RCA 70% circumflex 70% managed medically. Repeat cath in 2012 showed patent stent to the LAD and moderate disease in the RCA and OM 2. Flow wire to the RCA was not flow limiting. The patient had 3 admissions in 2013 with severe epistasis causing hemorrhagic shock with blood loss tachycardia and drop in blood pressure. This was treated with packing and transfusion. His Plavix was stopped. He was last seen here in October at which time he was having no chest pain he was having dyspnea with minimal exertion but was smoking 2 packs per day. Stress Myoview was recommended but patient chose not to have it done.   Patient comes in today complaining of 3 week history of recurrent chest pain. He describes as a tightness going into his jaws it occurs when he first wakes up in the morning. He has not used nitroglycerin. It can last anywhere from a few minutes her off and on for 60 minutes. It seems to improve as his day goes on. He has ongoing dyspnea on exertion. He continues to work as an electrician. Sunday was his most severe episode and he almost went to the hospital but chose not to.  Allergies  Allergen Reactions  . Penicillins Other (See Comments)    unknown     Current Outpatient Prescriptions  Medication Sig Dispense Refill  . aspirin EC 81 MG tablet Take 81 mg by mouth daily.    . atorvastatin (LIPITOR) 80 MG tablet Take 1 tablet (80 mg total) by mouth daily. 90 tablet 3  . carvedilol (COREG) 3.125 MG tablet Take 1 tablet (3.125 mg total) by mouth 2 (two) times daily with a meal. 60 tablet 11  . Multiple Vitamin (MULTIVITAMIN) capsule Take 1 capsule by mouth daily.      . nitroGLYCERIN (NITROSTAT) 0.4 MG SL tablet Place 1 tablet (0.4 mg total) under the tongue every 5 (five) minutes as needed for chest  pain. 1 tablet under tongue at onset of chest pain, you may repeat every 5 minutes for up to 3 doses. 25 tablet 6   No current facility-administered medications for this visit.    Past Medical History  Diagnosis Date  . HYPERLIPIDEMIA-MIXED 10/04/2010  . TOBACCO ABUSE 10/04/2010  . HYPERTENSION, BENIGN 10/04/2010  . CAD, NATIVE VESSEL 10/04/2010  . Alcohol abuse 12/27/2010  . Allergic rhinitis, cause unspecified 12/27/2010  . Stented coronary artery   . Anal warts 04/11/2012    Past Surgical History  Procedure Laterality Date  . Finger trauma      almost cut off  . Vasectomy      Family History  Problem Relation Age of Onset  . Heart disease Father   . Brain cancer Mother     History   Social History  . Marital Status: Single    Spouse Name: N/A    Number of Children: N/A  . Years of Education: N/A   Occupational History  . Electrical maintenance work      [employer]   Social History Main Topics  . Smoking status: Current Every Day Smoker -- 4.00 packs/day for 25 years    Types: Cigarettes  . Smokeless tobacco: Never Used     Comment: 2 plus packs per day. He has a 100 + pack -   year history of tobacco abuse currently. Former 4 ppd for 25 years.  . Alcohol Use: 25.2 oz/week    42 Cans of beer per week     Comment: He drinks a 6 -12 pack of beer per night  . Drug Use: No  . Sexual Activity: Yes    Birth Control/ Protection: None   Other Topics Concern  . Not on file   Social History Narrative   The patient lives in [place] with his girlfriend. 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 He is not routinely exercising.    ROS:see HPI otherwise negative  BP 138/62 mmHg  Pulse 85  Ht 5' 11" (1.803 m)  Wt 173 lb (78.472 kg)  BMI 24.14 kg/m2  PHYSICAL EXAM: Well-nournished, in no acute distress. Neck: No JVD, HJR, Bruit, or thyroid enlargement  Lungs: No tachypnea, clear without wheezing, rales, or rhonchi  Cardiovascular: RRR, PMI not displaced, Normal S1 and S2,  no murmurs, gallops, bruit, thrill, or heave.  Abdomen: BS normal. Soft without organomegaly, masses, lesions or tenderness.  Extremities: without cyanosis, clubbing or edema. Good distal pulses bilateral  SKin: Warm, no lesions or rashes   Musculoskeletal: No deformities  Neuro: no focal signs   Wt Readings from Last 3 Encounters:  09/27/14 173 lb (78.472 kg)  07/13/14 172 lb (78.019 kg)  12/24/13 170 lb 1.9 oz (77.166 kg)      EKG:NSR with nonspecific ST-T wave changes. EKG readout says acute MI but reviewed with Dr.Varanasi who concurs there are no acute changes and is similar to EKG in October 2015.  

## 2014-09-27 NOTE — Assessment & Plan Note (Signed)
Smoking cessation discussed 

## 2014-09-27 NOTE — Patient Instructions (Addendum)
Your physician has requested that you have a cardiac catheterization. Cardiac catheterization is used to diagnose and/or treat various heart conditions. Doctors may recommend this procedure for a number of different reasons. The most common reason is to evaluate chest pain. Chest pain can be a symptom of coronary artery disease (CAD), and cardiac catheterization can show whether plaque is narrowing or blocking your heart's arteries. This procedure is also used to evaluate the valves, as well as measure the blood flow and oxygen levels in different parts of your heart. For further information please visit [email or web address] Please follow instruction sheet, as given.  Your physician has recommended you make the following change in your medication:  1.) START IMDUR 30 MG ONCE DAILY

## 2014-09-27 NOTE — Assessment & Plan Note (Signed)
BP stable.

## 2014-09-28 ENCOUNTER — Encounter (HOSPITAL_COMMUNITY): Payer: Self-pay | Admitting: General Practice

## 2014-09-28 ENCOUNTER — Ambulatory Visit (HOSPITAL_COMMUNITY)
Admission: RE | Admit: 2014-09-28 | Discharge: 2014-09-29 | Disposition: A | Discharge status: Home / Self Care | Payer: BLUE CROSS/BLUE SHIELD | Source: Ambulatory Visit | Attending: Cardiovascular Disease | Admitting: Cardiovascular Disease

## 2014-09-28 ENCOUNTER — Encounter (HOSPITAL_COMMUNITY)
Admission: RE | Disposition: A | Discharge status: Home / Self Care | Payer: Self-pay | Source: Ambulatory Visit | Attending: Cardiovascular Disease

## 2014-09-28 DIAGNOSIS — I252 Old myocardial infarction: Secondary | ICD-10-CM | POA: Insufficient documentation

## 2014-09-28 DIAGNOSIS — Z955 Presence of coronary angioplasty implant and graft: Secondary | ICD-10-CM | POA: Diagnosis not present

## 2014-09-28 DIAGNOSIS — F17210 Nicotine dependence, cigarettes, uncomplicated: Secondary | ICD-10-CM | POA: Insufficient documentation

## 2014-09-28 DIAGNOSIS — I9589 Other hypotension: Secondary | ICD-10-CM | POA: Diagnosis not present

## 2014-09-28 DIAGNOSIS — E785 Hyperlipidemia, unspecified: Secondary | ICD-10-CM | POA: Diagnosis not present

## 2014-09-28 DIAGNOSIS — I2510 Atherosclerotic heart disease of native coronary artery without angina pectoris: Secondary | ICD-10-CM | POA: Diagnosis present

## 2014-09-28 DIAGNOSIS — Z87891 Personal history of nicotine dependence: Secondary | ICD-10-CM | POA: Diagnosis present

## 2014-09-28 DIAGNOSIS — Z79899 Other long term (current) drug therapy: Secondary | ICD-10-CM | POA: Insufficient documentation

## 2014-09-28 DIAGNOSIS — I25110 Atherosclerotic heart disease of native coronary artery with unstable angina pectoris: Secondary | ICD-10-CM

## 2014-09-28 DIAGNOSIS — I200 Unstable angina: Secondary | ICD-10-CM | POA: Diagnosis present

## 2014-09-28 DIAGNOSIS — Z880 Allergy status to penicillin: Secondary | ICD-10-CM | POA: Diagnosis not present

## 2014-09-28 DIAGNOSIS — I10 Essential (primary) hypertension: Secondary | ICD-10-CM | POA: Insufficient documentation

## 2014-09-28 DIAGNOSIS — Z7982 Long term (current) use of aspirin: Secondary | ICD-10-CM | POA: Insufficient documentation

## 2014-09-28 HISTORY — DX: Acute myocardial infarction, unspecified: I21.9

## 2014-09-28 HISTORY — DX: Pneumonia, unspecified organism: J18.9

## 2014-09-28 HISTORY — DX: Personal history of other medical treatment: Z92.89

## 2014-09-28 HISTORY — PX: LEFT HEART CATHETERIZATION WITH CORONARY ANGIOGRAM: SHX5451

## 2014-09-28 HISTORY — DX: Chronic obstructive pulmonary disease, unspecified: J44.9

## 2014-09-28 LAB — POCT ACTIVATED CLOTTING TIME: ACTIVATED CLOTTING TIME: 343 s

## 2014-09-28 SURGERY — LEFT HEART CATHETERIZATION WITH CORONARY ANGIOGRAM

## 2014-09-28 MED ORDER — CLOPIDOGREL BISULFATE 300 MG PO TABS
ORAL_TABLET | ORAL | Status: AC
  Filled 2014-09-28: qty 2

## 2014-09-28 MED ORDER — HEPARIN (PORCINE) IN NACL 2-0.9 UNIT/ML-% IJ SOLN
INTRAMUSCULAR | Status: AC
  Filled 2014-09-28: qty 1500

## 2014-09-28 MED ORDER — ASPIRIN EC 81 MG PO TBEC
81.0000 mg | DELAYED_RELEASE_TABLET | Freq: Every day | ORAL | Status: DC
  Administered 2014-09-29: 81 mg via ORAL
  Filled 2014-09-28: qty 1

## 2014-09-28 MED ORDER — HEPARIN SODIUM (PORCINE) 1000 UNIT/ML IJ SOLN
INTRAMUSCULAR | Status: AC
  Filled 2014-09-28: qty 1

## 2014-09-28 MED ORDER — CLOPIDOGREL BISULFATE 75 MG PO TABS
75.0000 mg | ORAL_TABLET | Freq: Every day | ORAL | Status: DC
  Administered 2014-09-29: 10:00:00 75 mg via ORAL
  Filled 2014-09-28: qty 1

## 2014-09-28 MED ORDER — SODIUM CHLORIDE 0.9 % IV SOLN: INTRAVENOUS | Status: AC

## 2014-09-28 MED ORDER — MIDAZOLAM HCL 2 MG/2ML IJ SOLN
INTRAMUSCULAR | Status: AC
  Filled 2014-09-28: qty 2

## 2014-09-28 MED ORDER — SODIUM CHLORIDE 0.9 % IV SOLN: 250.0000 mL | INTRAVENOUS | Status: DC | PRN

## 2014-09-28 MED ORDER — SODIUM CHLORIDE 0.9 % IJ SOLN
3.0000 mL | Freq: Two times a day (BID) | INTRAMUSCULAR | Status: DC
Start: 2014-09-28 — End: 2014-09-28

## 2014-09-28 MED ORDER — NITROGLYCERIN 0.4 MG SL SUBL: 0.4000 mg | SUBLINGUAL_TABLET | SUBLINGUAL | Status: DC | PRN

## 2014-09-28 MED ORDER — VERAPAMIL HCL 2.5 MG/ML IV SOLN
INTRAVENOUS | Status: AC
  Filled 2014-09-28: qty 2

## 2014-09-28 MED ORDER — FENTANYL CITRATE 0.05 MG/ML IJ SOLN
INTRAMUSCULAR | Status: AC
  Filled 2014-09-28: qty 2

## 2014-09-28 MED ORDER — ONDANSETRON HCL 4 MG/2ML IJ SOLN: 4.0000 mg | Freq: Four times a day (QID) | INTRAMUSCULAR | Status: DC | PRN

## 2014-09-28 MED ORDER — CARVEDILOL 3.125 MG PO TABS
3.1250 mg | ORAL_TABLET | Freq: Two times a day (BID) | ORAL | Status: DC
  Administered 2014-09-28 – 2014-09-29 (×2): 3.125 mg via ORAL
  Filled 2014-09-28 (×3): qty 1

## 2014-09-28 MED ORDER — LIDOCAINE HCL (PF) 1 % IJ SOLN
INTRAMUSCULAR | Status: AC
  Filled 2014-09-28: qty 30

## 2014-09-28 MED ORDER — ACETAMINOPHEN 325 MG PO TABS
650.0000 mg | ORAL_TABLET | ORAL | Status: DC | PRN
  Administered 2014-09-29: 650 mg via ORAL
  Filled 2014-09-28: qty 2

## 2014-09-28 MED ORDER — ISOSORBIDE MONONITRATE ER 30 MG PO TB24
30.0000 mg | ORAL_TABLET | Freq: Every day | ORAL | Status: DC
  Administered 2014-09-28: 30 mg via ORAL
  Filled 2014-09-28 (×2): qty 1

## 2014-09-28 MED ORDER — ATORVASTATIN CALCIUM 80 MG PO TABS
80.0000 mg | ORAL_TABLET | Freq: Every day | ORAL | Status: DC
  Administered 2014-09-28: 80 mg via ORAL
  Filled 2014-09-28 (×2): qty 1

## 2014-09-28 MED ORDER — SODIUM CHLORIDE 0.9 % IJ SOLN: 3.0000 mL | INTRAMUSCULAR | Status: DC | PRN

## 2014-09-28 MED ORDER — NITROGLYCERIN 1 MG/10 ML FOR IR/CATH LAB
INTRA_ARTERIAL | Status: AC
  Filled 2014-09-28: qty 10

## 2014-09-28 MED ORDER — ACTIVE PARTNERSHIP FOR HEALTH OF YOUR HEART BOOK
Freq: Once | Status: AC
  Administered 2014-09-28: 22:00:00
  Filled 2014-09-28: qty 1

## 2014-09-28 MED ORDER — FAMOTIDINE IN NACL 20-0.9 MG/50ML-% IV SOLN
INTRAVENOUS | Status: AC
  Filled 2014-09-28: qty 50

## 2014-09-28 NOTE — Interval H&P Note (Signed)
History and Physical Interval Note:  09/28/2014 1:00 PM  John [name]  has presented today for surgery, with the diagnosis of cp/unstable angina  The various methods of treatment have been discussed with the patient and family. After consideration of risks, benefits and other options for treatment, the patient has consented to  Procedure(s): LEFT HEART CATHETERIZATION WITH CORONARY ANGIOGRAM (N/A) as a surgical intervention .  The patient's history has been reviewed, patient examined, no change in status, stable for surgery.  I have reviewed the patient's chart and labs.  Questions were answered to the patient's satisfaction.    Cath Lab Visit (complete for each Cath Lab visit)  Clinical Evaluation Leading to the Procedure:   ACS: No.  Non-ACS:    Anginal Classification: CCS III  Anti-ischemic medical therapy: Maximal Therapy (2 or more classes of medications)  Non-Invasive Test Results: No non-invasive testing performed  Prior CABG: No previous CABG        Lyniah Fujita

## 2014-09-28 NOTE — H&P (View-Only) (Signed)
HPI: This is a 59 year old male patient of Dr. Sanjuana Kava with history of CAD status post an STEMI in 12 2011 treated with drug-eluting stent to the LAD. He had moderate disease in the RCA 70% circumflex 70% managed medically. Repeat cath in 2012 showed patent stent to the LAD and moderate disease in the RCA and OM 2. Flow wire to the RCA was not flow limiting. The patient had 3 admissions in 2013 with severe epistasis causing hemorrhagic shock with blood loss tachycardia and drop in blood pressure. This was treated with packing and transfusion. His Plavix was stopped. He was last seen here in October at which time he was having no chest pain he was having dyspnea with minimal exertion but was smoking 2 packs per day. Stress Myoview was recommended but patient chose not to have it done.   Patient comes in today complaining of 3 week history of recurrent chest pain. He describes as a tightness going into his jaws it occurs when he first wakes up in the morning. He has not used nitroglycerin. It can last anywhere from a few minutes her off and on for 60 minutes. It seems to improve as his day goes on. He has ongoing dyspnea on exertion. 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 Sunday was his most severe episode and he almost went to the hospital but chose not to.  Allergies  Allergen Reactions  . Penicillins Other (See Comments)    unknown     Current Outpatient Prescriptions  Medication Sig Dispense Refill  . aspirin EC 81 MG tablet Take 81 mg by mouth daily.    Marland Kitchen atorvastatin (LIPITOR) 80 MG tablet Take 1 tablet (80 mg total) by mouth daily. 90 tablet 3  . carvedilol (COREG) 3.125 MG tablet Take 1 tablet (3.125 mg total) by mouth 2 (two) times daily with a meal. 60 tablet 11  . Multiple Vitamin (MULTIVITAMIN) capsule Take 1 capsule by mouth daily.      . nitroGLYCERIN (NITROSTAT) 0.4 MG SL tablet Place 1 tablet (0.4 mg total) under the tongue every 5 (five) minutes as needed for chest  pain. 1 tablet under tongue at onset of chest pain, you may repeat every 5 minutes for up to 3 doses. 25 tablet 6   No current facility-administered medications for this visit.    Past Medical History  Diagnosis Date  . HYPERLIPIDEMIA-MIXED 10/04/2010  . TOBACCO ABUSE 10/04/2010  . HYPERTENSION, BENIGN 10/04/2010  . CAD, NATIVE VESSEL 10/04/2010  . Alcohol abuse 12/27/2010  . Allergic rhinitis, cause unspecified 12/27/2010  . Stented coronary artery   . Anal warts 04/11/2012    Past Surgical History  Procedure Laterality Date  . Finger trauma      almost cut off  . Vasectomy      Family History  Problem Relation Age of Onset  . Heart disease Father   . Brain cancer Mother     History   Social History  . Marital Status: Single    Spouse Name: N/A    Number of Children: N/A  . Years of Education: N/A   Occupational History  . Electrical maintenance work      [employer]   Social History Main Topics  . Smoking status: Current Every Day Smoker -- 4.00 packs/day for 25 years    Types: Cigarettes  . Smokeless tobacco: Never Used     Comment: 2 plus packs per day. He has a 100 + pack -  year history of tobacco abuse currently. Former 4 ppd for 25 years.  . Alcohol Use: 25.2 oz/week    42 Cans of beer per week     Comment: He drinks a 6 -12 pack of beer per night  . Drug Use: No  . Sexual Activity: Yes    Birth Control/ Protection: None   Other Topics Concern  . Not on file   Social History Narrative   The patient lives in [place] with his girlfriend. 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    ROS:see HPI otherwise negative  BP 138/62 mmHg  Pulse 85  Ht 5\' 11"  (1.803 m)  Wt 173 lb (78.472 kg)  BMI 24.14 kg/m2  PHYSICAL EXAM: Well-nournished, in no acute distress. Neck: No JVD, HJR, Bruit, or thyroid enlargement  Lungs: No tachypnea, clear without wheezing, rales, or rhonchi  Cardiovascular: RRR, PMI not displaced, Normal S1 and S2,  no murmurs, gallops, bruit, thrill, or heave.  Abdomen: BS normal. Soft without organomegaly, masses, lesions or tenderness.  Extremities: without cyanosis, clubbing or edema. Good distal pulses bilateral  SKin: Warm, no lesions or rashes   Musculoskeletal: No deformities  Neuro: no focal signs   Wt Readings from Last 3 Encounters:  09/27/14 173 lb (78.472 kg)  07/13/14 172 lb (78.019 kg)  12/24/13 170 lb 1.9 oz (77.166 kg)      EKG:NSR with nonspecific ST-T wave changes. EKG readout says acute MI but reviewed with Dr.Varanasi who concurs there are no acute changes and is similar to EKG in October 2015.

## 2014-09-28 NOTE — CV Procedure (Signed)
Cardiac Catheterization Operative Report  John [name] [id number] 1/6/20161:39 PM Oliver Barre, MD  Procedure Performed:  1. Left Heart Catheterization 2. Selective Coronary Angiography 3. Left ventricular angiogram 4. PTCA/DES x 1 mid Circumflex 5. PTCA/DES x 1 mid LAD  Operator: Verne Carrow, MD  Arterial access site:  Right radial artery.   Indication:  59 yo WM with history of tobacco abuse, CAD , HTN and hyperlipidemia here today for cardiac cath. He was initially admitted to [facility] in 2011 with a NSTEMI. He underwent diagnostic heart cath on 09/17/10 and was found to have a severe stenosis of the LAD. Dr. Riley Kill placed a drug eluting stent in the LAD. He was also found to have moderate disease in the RCA (70%) and Circumflex (70%) managed medically. He came back in May 2012 and described overall fatigue, feeling constantly tired, some dyspnea as well as chest pain, left sided and occurring almost daily. I arranged a repeat cardiac cath on March 12, 2011 which showed a patent LAD stent and moderate disease in the RCA and OM2. I flow wired the RCA and it was not flow limiting. FFR was 0. 97. The patient had 3 admissions in July 2013 with severe epistaxis causing hemorrhagic shock with blood loss tachycardia and drop in blood pressure. This was treated with packing and transfusion. His Plavix was stopped.  He has had no further bleeding over last 30 months after ENT procedure. Now with chest pain c/w unstable angina.                                   Procedure Details: The risks, benefits, complications, treatment options, and expected outcomes were discussed with the patient. The patient and/or family concurred with the proposed plan, giving informed consent. The patient was brought to the cath lab after IV hydration was begun and oral premedication was given. The patient was further sedated with Versed and Fentanyl. The right wrist was assessed with a  modified Allens test which was positive. The right wrist was prepped and draped in a sterile fashion. 1% lidocaine was used for local anesthesia. Using the modified Seldinger access technique, a 5 French sheath was placed in the right radial artery. 3 mg Verapamil was given through the sheath. 4000 units IV heparin was given. Standard diagnostic catheters were used to perform selective coronary angiography. A pigtail catheter was used to perform a left ventricular angiogram. He was found to have severe stenosis in the mid Circumflex and in the mid LAD. I elected to proceed to PCI of both vessels.    PCI Note: He was given an additional 6000 units of IV heparin. He was given Plavix 600 mg po x 1. When the ACT was over 200, I engaged the left main with a XB LAD 3.5 guiding catheter.   Lesion #1: (mid Circumflex): I advanced a Cougar IC wire down the Circumflex. A 2.5 x 12 mm balloon was used to pre-dilate the severe stenosis in the mid Circumflex artery. I then carefully positioned and deployed a 3.5 x 20 mm Promus Premier DES in the mid Circumflex. The stent was post-dilated with a 3.75 x 15 mm [place] balloon x 1. The stenosis was taken from 99% down to 0%.   Lesion #2 (mid LAD): I then pulled the wire back and advanced down the LAD. A 2.5 x 12 mm balloon was used to pre-dilate  the mid LAD stenosis. I then carefully positioned a 2.75 x 16 mm Promus Premier DES in the mid LAD, overlapping with the old stent on the distal edge of the old stent. The stent was post-dilated with a 3.0 x 12 mm [place] balloon x 1. The stenosis was taken from 80% down to 0%.   The sheath was removed from the right radial artery and a Terumo hemostasis band was applied at the arteriotomy site on the right wrist. There were no immediate complications. The patient was taken to the recovery area in stable condition.   Hemodynamic Findings: Central aortic pressure: 99/68 Left ventricular pressure: 99/5/12  Angiographic Findings:  Left  main: No obstructive disease.   Left Anterior Descending Artery: Large caliber vessel that courses to the apex. There is mild plaque in the proximal vessel.The mid vessel has a patent stent without restenosis. Just beyond the stented segment in the mid LAD there is a focal 80% stenosis. This is seen in several views and is felt to be flow limiting.    Circumflex Artery: Large caliber vessel with small first obtuse marginal branch and large second obtuse marginal branch. The mid AV groove Circumflex has a 99% stenosis. The superior branch of OM2 has a focal 40% stenosis, unchanged from last cath.   Right Coronary Artery: Large dominant vessel with diffuse 50% proximal stenosis, unchanged from last cath.   Left Ventricular Angiogram: LVEF=60-65%.   Impression: 1. Triple vessel CAD 2. Severe stenosis in mid Circumflex, now s/p successful PTCA/DES x 1 mid Circumflex 3. Severe stenosis mid LAD, now s/p successful PTCA/DES x 1 mid LAD 4. Unstable angina 5. Normal LV function  Recommendations: Dual anti-platelet therapy with ASA and Plavix for at least one year. Continue beta blocker and statin. Smoking cessation.        Complications:  None. The patient tolerated the procedure well.

## 2014-09-29 DIAGNOSIS — I10 Essential (primary) hypertension: Secondary | ICD-10-CM | POA: Diagnosis not present

## 2014-09-29 DIAGNOSIS — I25110 Atherosclerotic heart disease of native coronary artery with unstable angina pectoris: Secondary | ICD-10-CM | POA: Diagnosis not present

## 2014-09-29 DIAGNOSIS — Z880 Allergy status to penicillin: Secondary | ICD-10-CM | POA: Diagnosis not present

## 2014-09-29 DIAGNOSIS — I200 Unstable angina: Secondary | ICD-10-CM

## 2014-09-29 DIAGNOSIS — Z955 Presence of coronary angioplasty implant and graft: Secondary | ICD-10-CM | POA: Diagnosis not present

## 2014-09-29 LAB — BASIC METABOLIC PANEL
Anion gap: 4 — ABNORMAL LOW (ref 5–15)
BUN: 11 mg/dL (ref 6–23)
CHLORIDE: 103 meq/L (ref 96–112)
CO2: 33 mmol/L — ABNORMAL HIGH (ref 19–32)
CREATININE: 0.91 mg/dL (ref 0.50–1.35)
Calcium: 9 mg/dL (ref 8.4–10.5)
GFR calc Af Amer: >90 mL/min (ref >90)
GFR calc non Af Amer: >90 mL/min (ref >90)
Glucose, Bld: 111 mg/dL — ABNORMAL HIGH (ref 70–99)
POTASSIUM: 4.2 mmol/L (ref 3.5–5.1)
Sodium: 140 mmol/L (ref 135–145)

## 2014-09-29 LAB — CBC
HEMATOCRIT: 43.8 % (ref 39.0–52.0)
HEMOGLOBIN: 14.9 g/dL (ref 13.0–17.0)
MCH: 35.6 pg — ABNORMAL HIGH (ref 26.0–34.0)
MCHC: 34 g/dL (ref 30.0–36.0)
MCV: 104.5 fL — AB (ref 78.0–100.0)
Platelets: 171 10*3/uL (ref 150–400)
RBC: 4.19 MIL/uL — ABNORMAL LOW (ref 4.22–5.81)
RDW: 12.6 % (ref 11.5–15.5)
WBC: 7.1 10*3/uL (ref 4.0–10.5)

## 2014-09-29 MED ORDER — CLOPIDOGREL BISULFATE 75 MG PO TABS: 75.0000 mg | ORAL_TABLET | Freq: Every day | ORAL | Status: DC

## 2014-09-29 NOTE — Progress Notes (Signed)
TR BAND REMOVAL  LOCATION:    right radial  DEFLATED PER PROTOCOL:    Yes.    TIME BAND OFF / DRESSING APPLIED:    21:45   SITE UPON ARRIVAL:    Level 0  SITE AFTER BAND REMOVAL:    Level 0  REVERSE ALLEN'S TEST:     positive  CIRCULATION SENSATION AND MOVEMENT:    Within Normal Limits yes  COMMENTS:   Pt tolerated removal of TR band without complication, will continue to monitor patient.

## 2014-09-29 NOTE — Progress Notes (Signed)
CARDIAC REHAB PHASE I   PRE:  Rate/Rhythm: 85 SR  BP:  Supine:   Sitting: 119/58  Standing:    SaO2:   MODE:  Ambulation: 1000 ft   POST:  Rate/Rhythm: 106 ST  BP:  Supine:   Sitting: 145/88  Standing: 162/89   SaO2:  0840-0935 Pt walked 1000 ft with steady gait. Tolerated well. No CP. Discussed with pt smoking cessation and ETOH use and pt stated he knows that he needs to make changes. Pt used to smoke 4ppd but now smokes 2ppd. He tried nicotine patches before and smoked when using them. He said he once got chantix prescription but never used. Pt thinks he will quit cold [place]. Gave smoking cessation handouts, fake cigarette and discussed 1800quitnow. Pt eats poorly and does not like [place], chicken or fish. Tried to give some heart healthy choices for protein besides the beef and pork. Discussed importance of exercise and discussed walking program. Declined CRP 2 due to work hours. Discussed importance of plavix with stents. Significant other in room for all ed. Understanding voiced by pt.   Luetta Nutting, RN BSN  09/29/2014 9:31 AM

## 2014-09-29 NOTE — Discharge Summary (Signed)
Physician Discharge Summary     Cardiologist:  John [name]  Patient ID: John [name] MRN: [id number] DOB/AGE: 01/23/1956 59 y.o.  Admit date: 09/28/2014 Discharge date: 09/29/2014  Admission Diagnoses:  Unstable angina  Discharge Diagnoses:  Active Problems:   TOBACCO ABUSE   HYPERTENSION, BENIGN   CAD, NATIVE VESSEL   Unstable angina   Discharged Condition: stable  Hospital Course:   The patient is a 59 year old male patient of Dr. Sanjuana [name] with history of CAD status post an STEMI in 12 2011 treated with drug-eluting stent to the LAD. He had moderate disease in the RCA 70% circumflex 70% managed medically. Repeat cath in 2012 showed patent stent to the LAD and moderate disease in the RCA and OM 2. Flow wire to the RCA was not flow limiting. The patient had 3 admissions in 2013 with severe epistasis causing hemorrhagic shock with blood loss tachycardia and drop in blood pressure. This was treated with packing and transfusion. His Plavix was stopped. He was last seen here in October at which time he was having no chest pain he was having dyspnea with minimal exertion but was smoking 2 packs per day. Stress Myoview was recommended but patient chose not to have it done.  Patient was seen in the office on 09/27/14 complaining of 3 week history of recurrent chest pain. He described as a tightness going into his jaws it occurs when he first wakes up in the morning. He has not used nitroglycerin. It can last anywhere from a few minutes her off and on for 60 minutes. It seems to improve as his day goes on. He has ongoing dyspnea on exertion. 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 Sunday was his most severe episode and he almost went to the hospital but chose not to.  The patient was scheduled for and underwent LHC revealing triple vessel CAD. Severe stenosis in mid Circumflex and mid LAD. He underwent successful PTCA/DES to each. Normal LV function. Marland Kitchen BP- some mild hypotension. ASA, plavix,  coreg 3.125, lipitor 80.  Imdur discontinued.   He ambulated very well with cardiac rehab. Tobacco cessation counseling was done.  The patient was seen by John [name] who felt he was stable for DC home.   Consults: Cardiac Rehab  Significant Diagnostic Studies:  Cardiac Catheterization Operative Report  John [name] [id number] 1/6/20161:39 PM John Barre, MD  Procedure Performed:  1. Left Heart Catheterization 2. Selective Coronary Angiography 3. Left ventricular angiogram 4. PTCA/DES x 1 mid Circumflex 5. PTCA/DES x 1 mid LAD  Operator: John Carrow, MD  Arterial access site: Right radial artery.   Indication: 59 yo WM with history of tobacco abuse, CAD , HTN and hyperlipidemia here today for cardiac cath. He was initially admitted to [facility] in [facility] with a NSTEMI. He underwent diagnostic heart cath on 09/17/10 and was found to have a severe stenosis of the LAD. Dr. Riley [name] placed a drug eluting stent in the LAD. He was also found to have moderate disease in the RCA (70%) and Circumflex (70%) managed medically. He came back in May 2012 and described overall fatigue, feeling constantly tired, some dyspnea as well as chest pain, left sided and occurring almost daily. I arranged a repeat cardiac cath on March 12, 2011 which showed a patent LAD stent and moderate disease in the RCA and OM2. I flow wired the RCA and it was not flow limiting. FFR was 0. 97. The patient had 3 admissions in July 2013 with severe  epistaxis causing hemorrhagic shock with blood loss tachycardia and drop in blood pressure. This was treated with packing and transfusion. His Plavix was stopped. He has had no further bleeding over last 30 months after ENT procedure. Now with chest pain c/w unstable angina.  Procedure Details: The risks, benefits, complications, treatment options, and expected outcomes were discussed with the patient. The patient and/or family  concurred with the proposed plan, giving informed consent. The patient was brought to the cath lab after IV hydration was begun and oral premedication was given. The patient was further sedated with Versed and Fentanyl. The right wrist was assessed with a modified Allens test which was positive. The right wrist was prepped and draped in a sterile fashion. 1% lidocaine was used for local anesthesia. Using the modified Seldinger access technique, a 5 French sheath was placed in the right radial artery. 3 mg Verapamil was given through the sheath. 4000 units IV heparin was given. Standard diagnostic catheters were used to perform selective coronary angiography. A pigtail catheter was used to perform a left ventricular angiogram. He was found to have severe stenosis in the mid Circumflex and in the mid LAD. I elected to proceed to PCI of both vessels.   PCI Note: He was given an additional 6000 units of IV heparin. He was given Plavix 600 mg po x 1. When the ACT was over 200, I engaged the left main with a XB LAD 3.5 guiding catheter.   Lesion #1: (mid Circumflex): I advanced a Cougar IC wire down the Circumflex. A 2.5 x 12 mm balloon was used to pre-dilate the severe stenosis in the mid Circumflex artery. I then carefully positioned and deployed a 3.5 x 20 mm Promus Premier DES in the mid Circumflex. The stent was post-dilated with a 3.75 x 15 mm [place] balloon x 1. The stenosis was taken from 99% down to 0%.   Lesion #2 (mid LAD): I then pulled the wire back and advanced down the LAD. A 2.5 x 12 mm balloon was used to pre-dilate the mid LAD stenosis. I then carefully positioned a 2.75 x 16 mm Promus Premier DES in the mid LAD, overlapping with the old stent on the distal edge of the old stent. The stent was post-dilated with a 3.0 x 12 mm [place] balloon x 1. The stenosis was taken from 80% down to 0%.   The sheath was removed from the right radial artery and a Terumo hemostasis band was applied at the arteriotomy  site on the right wrist. There were no immediate complications. The patient was taken to the recovery area in stable condition.   Hemodynamic Findings: Central aortic pressure: 99/68 Left ventricular pressure: 99/5/12  Angiographic Findings:  Left main: No obstructive disease.   Left Anterior Descending Artery: Large caliber vessel that courses to the apex. There is mild plaque in the proximal vessel.The mid vessel has a patent stent without restenosis. Just beyond the stented segment in the mid LAD there is a focal 80% stenosis. This is seen in several views and is felt to be flow limiting.   Circumflex Artery: Large caliber vessel with small first obtuse marginal branch and large second obtuse marginal branch. The mid AV groove Circumflex has a 99% stenosis. The superior branch of OM2 has a focal 40% stenosis, unchanged from last cath.   Right Coronary Artery: Large dominant vessel with diffuse 50% proximal stenosis, unchanged from last cath.   Left Ventricular Angiogram: LVEF=60-65%.   Impression: 1. Triple vessel  CAD 2. Severe stenosis in mid Circumflex, now s/p successful PTCA/DES x 1 mid Circumflex 3. Severe stenosis mid LAD, now s/p successful PTCA/DES x 1 mid LAD 4. Unstable angina 5. Normal LV function  Recommendations: Dual anti-platelet therapy with ASA and Plavix for at least one year. Continue beta blocker and statin. Smoking cessation.    Complications: None. The patient tolerated the procedure well.   Treatments:  See above   Discharge Exam: Blood pressure 119/58, pulse 89, temperature 97.8 F (36.6 C), temperature source Oral, resp. rate 20, height  (1.803 m), weight 173 lb 11.6 oz (78.8 kg), SpO2 95 %.   Disposition: 01-Home or Self Care      Discharge Instructions    Diet - low sodium heart healthy    Complete by:  As directed      Discharge instructions    Complete by:  As directed   No lifting with right arm for three days.      Increase activity slowly    Complete by:  As directed             Medication List    STOP taking these medications        isosorbide mononitrate 30 MG 24 hr tablet  Commonly known as:  IMDUR      TAKE these medications        aspirin EC 81 MG tablet  Take 81 mg by mouth daily.     atorvastatin 80 MG tablet  Commonly known as:  LIPITOR  Take 1 tablet (80 mg total) by mouth daily.     carvedilol 3.125 MG tablet  Commonly known as:  COREG  Take 1 tablet (3.125 mg total) by mouth 2 (two) times daily with a meal.     clopidogrel 75 MG tablet  Commonly known as:  PLAVIX  Take 1 tablet (75 mg total) by mouth daily with breakfast.     multivitamin with minerals Tabs tablet  Take 1 tablet by mouth daily.     nitroGLYCERIN 0.4 MG SL tablet  Commonly known as:  NITROSTAT  Place 1 tablet (0.4 mg total) under the tongue every 5 (five) minutes as needed for chest pain. 1 tablet under tongue at onset of chest pain, you may repeat every 5 minutes for up to 3 doses.       Follow-up Information    Follow up with Norma Fredrickson, NP On 10/12/2014.   Specialty:  Nurse Practitioner   Why:  2:30  PM   Contact information:   [address] [place] [address] [phone]      Greater than 30 minutes was spent completing the patient's discharge.    SignedWilburt Finlay, PAC 09/29/2014, 10:27 AM

## 2014-09-29 NOTE — Progress Notes (Signed)
Subjective: No complaints  Objective: Vital signs in last 24 hours: Temp:  [97.5 F (36.4 C)-98.6 F (37 C)] 97.5 F (36.4 C) (01/07 0555) Pulse Rate:  [73-107] 93 (01/07 0555) Resp:  [18-20] 20 (01/07 0555) BP: (93-162)/(63-90) 124/78 mmHg (01/07 0555) SpO2:  [86 %-98 %] 96 % (01/07 0555) Weight:  [173 lb (78.472 kg)-173 lb 11.6 oz (78.8 kg)] 173 lb 11.6 oz (78.8 kg) (01/07 0100) Last BM Date: 09/28/14  Intake/Output from previous day: 01/06 0701 - 01/07 0700 In: 1291.3 [P.O.:480; I.V.:811.3] Out: 800 [Urine:800] Intake/Output this shift: Total I/O In: 225 [I.V.:225] Out: 300 [Urine:300]  Medications Current Facility-Administered Medications  Medication Dose Route Frequency Provider Last Rate Last Dose  . acetaminophen (TYLENOL) tablet 650 mg  650 mg Oral Q4H PRN Kathleene Hazel, MD   650 mg at 09/29/14 0250  . aspirin EC tablet 81 mg  81 mg Oral Daily Kathleene Hazel, MD      . atorvastatin (LIPITOR) tablet 80 mg  80 mg Oral QHS Kathleene Hazel, MD   80 mg at 09/28/14 2128  . carvedilol (COREG) tablet 3.125 mg  3.125 mg Oral BID WC Kathleene Hazel, MD   3.125 mg at 09/28/14 1711  . clopidogrel (PLAVIX) tablet 75 mg  75 mg Oral Q breakfast Kathleene Hazel, MD      . isosorbide mononitrate (IMDUR) 24 hr tablet 30 mg  30 mg Oral Daily Kathleene Hazel, MD   30 mg at 09/28/14 1711  . nitroGLYCERIN (NITROSTAT) SL tablet 0.4 mg  0.4 mg Sublingual Q5 min PRN Kathleene Hazel, MD      . ondansetron [facility]) injection 4 mg  4 mg Intravenous Q6H PRN Kathleene Hazel, MD        PE: General appearance: alert, cooperative and no distress Lungs: Decrease BS throughout.  no wheeze or rales Heart: regular rate and rhythm, S1, S2 normal, no murmur, click, rub or gallop Extremities: No LEE Pulses: 2+ and symmetric Skin: Warm and dry.  right wrist stable Neurologic: Grossly normal  Lab Results:   Recent Labs  09/27/14 1349    WBC 9.2  HGB 15.9  HCT 47.5  PLT 205.0   BMET  Recent Labs  09/27/14 1349  NA 136  K 4.0  CL 100  CO2 32  GLUCOSE 93  BUN 13  CREATININE 0.9  CALCIUM 9.4   PT/INR  Recent Labs  09/27/14 1349  LABPROT 10.4  INR 0.9    Assessment/Plan    Active Problems:   TOBACCO ABUSE   HYPERTENSION, BENIGN   CAD, NATIVE VESSEL   Unstable angina    Plan: SP LHC revealing triple vessel CAD.  Severe stenosis in mid Circumflex and mid LAD.  He underwent successful PTCA/DES to each.  Normal LV function.  Labs pending.  BP- some mild hypotension.  ASA, plvix, coreg 3.125, imdur 30. lipitor 80.  Ambulate with CR and DC home today.    LOS: 1 day    HAGER, BRYAN PA-C 09/29/2014 6:46 AM  Patient seen, examined. Available data reviewed. Agree with findings, assessment, and plan as outlined by Wilburt Finlay, PA-C. The patient feels well this morning with no chest pain or dyspnea. Exam is unremarkable with a clear right radial site without hematoma. Lungs are clear. Heart is regular rate and rhythm. There is no peripheral edema. Reviewed medications with him. He understands the importance of adherence to dual antiplatelet therapy with aspirin and Plavix. Tobacco cessation  counseling was done. Now that he has been revascularized, I would recommend stopping isosorbide. He is having bad headaches and hopefully his angina will be resolved with PCI. Will arrange follow-up with Dr. Clifton James or his PA/NP. He requests an appointment after 3 PM if possible.  Tonny Bollman, M.D. 09/29/2014 9:39 AM

## 2014-10-12 ENCOUNTER — Encounter: Payer: BLUE CROSS/BLUE SHIELD | Admitting: Nurse Practitioner

## 2014-10-13 ENCOUNTER — Other Ambulatory Visit: Payer: Self-pay

## 2014-10-13 ENCOUNTER — Telehealth: Payer: Self-pay | Admitting: Cardiovascular Disease

## 2014-10-13 MED ORDER — ATORVASTATIN CALCIUM 80 MG PO TABS: 80.0000 mg | ORAL_TABLET | Freq: Every day | ORAL | Status: DC

## 2014-10-13 MED ORDER — CLOPIDOGREL BISULFATE 75 MG PO TABS: 75.0000 mg | ORAL_TABLET | Freq: Every day | ORAL | Status: DC

## 2014-10-13 NOTE — Telephone Encounter (Signed)
Walk in prescription form " pt needs refills " gave to refill dept

## 2014-10-17 ENCOUNTER — Telehealth: Payer: Self-pay | Admitting: Cardiovascular Disease

## 2014-10-17 NOTE — Telephone Encounter (Signed)
Walk in script " pt needs refill " gave to refill dept  °

## 2014-10-20 ENCOUNTER — Other Ambulatory Visit: Payer: Self-pay

## 2014-10-20 DIAGNOSIS — I10 Essential (primary) hypertension: Secondary | ICD-10-CM

## 2014-10-20 DIAGNOSIS — I2510 Atherosclerotic heart disease of native coronary artery without angina pectoris: Secondary | ICD-10-CM

## 2014-10-20 MED ORDER — CLOPIDOGREL BISULFATE 75 MG PO TABS
75.0000 mg | ORAL_TABLET | Freq: Every day | ORAL | Status: DC
Start: 2014-10-20 — End: 2015-10-05

## 2014-10-20 MED ORDER — ATORVASTATIN CALCIUM 80 MG PO TABS: 80.0000 mg | ORAL_TABLET | Freq: Every day | ORAL | Status: DC

## 2014-10-20 MED ORDER — CARVEDILOL 3.125 MG PO TABS: 3.1250 mg | ORAL_TABLET | Freq: Two times a day (BID) | ORAL | Status: DC

## 2014-10-28 ENCOUNTER — Other Ambulatory Visit: Payer: Self-pay

## 2014-11-04 ENCOUNTER — Ambulatory Visit (INDEPENDENT_AMBULATORY_CARE_PROVIDER_SITE_OTHER): Payer: BLUE CROSS/BLUE SHIELD | Admitting: Nurse Practitioner

## 2014-11-04 ENCOUNTER — Telehealth: Payer: Self-pay | Admitting: *Deleted

## 2014-11-04 ENCOUNTER — Encounter: Payer: Self-pay | Admitting: Nurse Practitioner

## 2014-11-04 VITALS — BP 130/70 | HR 78 | Ht 71.0 in | Wt 172.5 lb

## 2014-11-04 DIAGNOSIS — E782 Mixed hyperlipidemia: Secondary | ICD-10-CM

## 2014-11-04 DIAGNOSIS — Z955 Presence of coronary angioplasty implant and graft: Secondary | ICD-10-CM

## 2014-11-04 DIAGNOSIS — Z720 Tobacco use: Secondary | ICD-10-CM

## 2014-11-04 DIAGNOSIS — I2510 Atherosclerotic heart disease of native coronary artery without angina pectoris: Secondary | ICD-10-CM

## 2014-11-04 DIAGNOSIS — I10 Essential (primary) hypertension: Secondary | ICD-10-CM

## 2014-11-04 LAB — HEPATIC FUNCTION PANEL
ALT: 21 U/L (ref 0–53)
AST: 21 U/L (ref 0–37)
Albumin: 4 g/dL (ref 3.5–5.2)
Alkaline Phosphatase: 136 U/L — ABNORMAL HIGH (ref 39–117)
Bilirubin, Direct: 0.1 mg/dL (ref 0.0–0.3)
Total Bilirubin: 0.4 mg/dL (ref 0.2–1.2)
Total Protein: 6.5 g/dL (ref 6.0–8.3)

## 2014-11-04 LAB — BASIC METABOLIC PANEL
BUN: 12 mg/dL (ref 6–23)
CO2: 37 mEq/L — ABNORMAL HIGH (ref 19–32)
Calcium: 9.4 mg/dL (ref 8.4–10.5)
Chloride: 103 mEq/L (ref 96–112)
Creatinine, Ser: 0.94 mg/dL (ref 0.40–1.50)
GFR: 87.85 mL/min (ref >60.00)
Glucose, Bld: 88 mg/dL (ref 70–99)
Potassium: 4 mEq/L (ref 3.5–5.1)
Sodium: 139 mEq/L (ref 135–145)

## 2014-11-04 LAB — CBC
HCT: 45.2 % (ref 39.0–52.0)
Hemoglobin: 15.7 g/dL (ref 13.0–17.0)
MCHC: 34.7 g/dL (ref 30.0–36.0)
MCV: 102.6 fl — ABNORMAL HIGH (ref 78.0–100.0)
Platelets: 209 10*3/uL (ref 150.0–400.0)
RBC: 4.41 Mil/uL (ref 4.22–5.81)
RDW: 13.1 % (ref 11.5–15.5)
WBC: 7.4 10*3/uL (ref 4.0–10.5)

## 2014-11-04 LAB — LIPID PANEL
Cholesterol: 127 mg/dL (ref 0–200)
HDL: 49.2 mg/dL (ref >39.00)
LDL Cholesterol: 47 mg/dL (ref 0–99)
NonHDL: 77.8
Total CHOL/HDL Ratio: 3
Triglycerides: 154 mg/dL — ABNORMAL HIGH (ref 0.0–149.0)
VLDL: 30.8 mg/dL (ref 0.0–40.0)

## 2014-11-04 NOTE — Telephone Encounter (Signed)
Received message that pt wanted to talk with me after visit in our office today.  I called and spoke with pt who reports problems with his prescriptions being sent in. He thinks everything is correct at this time and has no questions.   He is asking for my direct number.  I told pt I did not have direct number or voicemail but that he could call main line and ask that medication refill requests or questions be sent to me.  He is aware this will be covered by triage nurse when I am not in office.

## 2014-11-04 NOTE — Progress Notes (Signed)
CARDIOLOGY OFFICE NOTE  Date:  11/04/2014    John [name] Date of Birth: Dec 25, 1955 Medical Record #[id number]  PCP:  Oliver Barre, MD  Cardiologist:  [facility]    Chief Complaint  Patient presents with  . Coronary Artery Disease    Post PCI visit/1 month check - seen for Dr. Clifton James    History of Present Illness: John [name] is a 59 y.o. male who presents today for a post hospital/post PCI visit. He is a patient of Dr. Weldon Picking who has a history of CAD status post a STEMI in 08/2010 treated with drug-eluting stent to the LAD. He had moderate disease in the RCA 70% circumflex 70% managed medically. Repeat cath in 2012 showed patent stent to the LAD and moderate disease in the RCA and OM 2. Flow wire to the RCA was not flow limiting. The patient had 3 admissions in 2013 with severe epistasis causing hemorrhagic shock with blood loss tachycardia and drop in blood pressure. This was treated with packing and transfusion. His Plavix was stopped. He was last seen here in October of 2015 at which time he was having no chest pain he was having dyspnea with minimal exertion but was smoking 2 packs per day. Stress Myoview was recommended but patient chose not to have it done.  Patient was seen in the office on 09/27/14 complaining of 3 week history of recurrent chest pain. He described as a tightness going into his jaws it occurs when he first wakes up in the morning. He had not used nitroglycerin. He refused to be admitted.   The patient was scheduled for and underwent LHC revealing triple vessel CAD. Severe stenosis in mid Circumflex and mid LAD. He underwent successful PTCA/DES to each. Normal LV function.  ASA, plavix, coreg 3.125, lipitor 80 continued. Imdur discontinued.   Comes back here today. Here alone. Doing ok. Continues to smoke. Not ready to stop. Not sure about his medicines - his girlfriend manages his medicines. No more chest pain. He says he is doing ok. No  problems with his cath site.   Past Medical History  Diagnosis Date  . HYPERLIPIDEMIA-MIXED 10/04/2010  . TOBACCO ABUSE 10/04/2010  . HYPERTENSION, BENIGN 10/04/2010  . CAD, NATIVE VESSEL 10/04/2010  . Alcohol abuse 12/27/2010  . Allergic rhinitis, cause unspecified 12/27/2010  . Anal warts 04/11/2012  . Myocardial infarction 09/16/2010  . COPD (chronic obstructive pulmonary disease)     "a touch" (09/28/2013)  . Pneumonia     "when I was a kid"  . History of blood transfusion 03/2012    related to nose bleed    Past Surgical History  Procedure Laterality Date  . Finger surgery Left     "almost cut off" tip of 2nd digit  . Vasectomy    . Coronary angioplasty with stent placement  09/17/2010; 09/28/2014    "1; 2"  . Cardiac catheterization  ~ 2012  . Nasal endoscopy with epistaxis control Bilateral 03/2012  . Left heart catheterization with coronary angiogram N/A 09/28/2014    Procedure: LEFT HEART CATHETERIZATION WITH CORONARY ANGIOGRAM;  Surgeon: Kathleene Hazel, MD;  Location: [facility] CATH LAB;  Service: Cardiovascular;  Laterality: N/A;     Medications: Current Outpatient Prescriptions  Medication Sig Dispense Refill  . aspirin EC 81 MG tablet Take 81 mg by mouth daily.    Marland Kitchen atorvastatin (LIPITOR) 80 MG tablet Take 1 tablet (80 mg total) by mouth daily. 90 tablet 3  . carvedilol (COREG)  3.125 MG tablet Take 1 tablet (3.125 mg total) by mouth 2 (two) times daily with a meal. 180 tablet 2  . clopidogrel (PLAVIX) 75 MG tablet Take 1 tablet (75 mg total) by mouth daily with breakfast. 90 tablet 3  . Multiple Vitamin (MULTIVITAMIN WITH MINERALS) TABS tablet Take 1 tablet by mouth daily.    . nitroGLYCERIN (NITROSTAT) 0.4 MG SL tablet Place 1 tablet (0.4 mg total) under the tongue every 5 (five) minutes as needed for chest pain. 1 tablet under tongue at onset of chest pain, you may repeat every 5 minutes for up to 3 doses. 25 tablet 6   No current facility-administered medications for this  visit.    Allergies: Allergies  Allergen Reactions  . Penicillins Other (See Comments)    Passes out    Social History: The patient  reports that he has been smoking Cigarettes.  He has a 90 pack-year smoking history. He has never used smokeless tobacco. He reports that he drinks about 86.4 oz of alcohol per week. He reports that he does not use illicit drugs.   Family History: The patient's family history includes Brain cancer in his mother; Heart disease in his father.   Review of Systems: Please see the history of present illness.   Otherwise, the review of systems is positive for .   All other systems are reviewed and negative.   Physical Exam: VS:  BP 130/70 mmHg  Pulse 78  Ht  (1.803 m)  Wt 172 lb 8 oz (78.245 kg)  BMI 24.07 kg/m2 .  BMI Body mass index is 24.07 kg/(m^2).  Wt Readings from Last 3 Encounters:  11/04/14 172 lb 8 oz (78.245 kg)  09/29/14 173 lb 11.6 oz (78.8 kg)  09/27/14 173 lb (78.472 kg)    General: Pleasant. Smells of tobacco. Alert and in no acute distress.  HEENT: Normal. Neck: Supple, no JVD, carotid bruits, or masses noted.  Cardiac: Regular rate and rhythm. No murmurs, rubs, or gallops. No edema.  Respiratory:  Lungs are coarse to auscultation bilaterally with normal work of breathing.  GI: Soft and nontender.  MS: No deformity or atrophy. Gait and ROM intact. Skin: Warm and dry. Color is normal.  Neuro:  Strength and sensation are intact and no gross focal deficits noted.  Psych: Alert, appropriate and with normal affect.   LABORATORY DATA:  EKG:  EKG is not ordered today.   Lab Results  Component Value Date   WBC 7.1 09/29/2014   HGB 14.9 09/29/2014   HCT 43.8 09/29/2014   PLT 171 09/29/2014   GLUCOSE 111* 09/29/2014   CHOL 137 12/24/2013   TRIG 182* 12/24/2013   HDL 51 12/24/2013   LDLCALC 50 12/24/2013   ALT 18 12/24/2013   AST 21 12/24/2013   NA 140 09/29/2014   K 4.2 09/29/2014   CL 103 09/29/2014   CREATININE  0.91 09/29/2014   BUN 11 09/29/2014   CO2 33* 09/29/2014   TSH 3.921 09/18/2010   PSA 0.71 12/27/2010   INR 0.9 09/27/2014   HGBA1C * 09/18/2010    5.7 (NOTE)  According to the ADA Clinical Practice Recommendations for 2011, when HbA1c is used as a screening test:   >=6.5%   Diagnostic of Diabetes Mellitus           (if abnormal result  is confirmed)  5.7-6.4%   Increased risk of developing Diabetes Mellitus  References:Diagnosis and Classification of Diabetes Mellitus,Diabetes Care,2011,34(Suppl 1):S62-S69 and Standards of Medical Care in         Diabetes - 2011,Diabetes Care,2011,34  (Suppl 1):S11-S61.    BNP (last 3 results) No results for input(s): BNP in the last 8760 hours.  ProBNP (last 3 results) No results for input(s): PROBNP in the last 8760 hours.   Other Studies Reviewed Today:   Cardiac Catheterization Operative Report  ISIDOR BROMELL [id number] 1/6/20161:39 PM Oliver Barre, MD  Procedure Performed:  1. Left Heart Catheterization 2. Selective Coronary Angiography 3. Left ventricular angiogram 4. PTCA/DES x 1 mid Circumflex 5. PTCA/DES x 1 mid LAD  Operator: Verne Carrow, MD  Arterial access site: Right radial artery.   Indication: 59 yo WM with history of tobacco abuse, CAD , HTN and hyperlipidemia here today for cardiac cath. He was initially admitted to [facility] in 2011 with a NSTEMI. He underwent diagnostic heart cath on 09/17/10 and was found to have a severe stenosis of the LAD. Dr. Riley Kill placed a drug eluting stent in the LAD. He was also found to have moderate disease in the RCA (70%) and Circumflex (70%) managed medically. He came back in May 2012 and described overall fatigue, feeling constantly tired, some dyspnea as well as chest pain, left sided and occurring almost daily. I arranged a repeat cardiac cath on March 12, 2011 which showed a patent LAD stent and  moderate disease in the RCA and OM2. I flow wired the RCA and it was not flow limiting. FFR was 0. 97. The patient had 3 admissions in July 2013 with severe epistaxis causing hemorrhagic shock with blood loss tachycardia and drop in blood pressure. This was treated with packing and transfusion. His Plavix was stopped. He has had no further bleeding over last 30 months after ENT procedure. Now with chest pain c/w unstable angina.   Procedure Details: The risks, benefits, complications, treatment options, and expected outcomes were discussed with the patient. The patient and/or family concurred with the proposed plan, giving informed consent. The patient was brought to the cath lab after IV hydration was begun and oral premedication was given. The patient was further sedated with Versed and Fentanyl. The right wrist was assessed with a modified Allens test which was positive. The right wrist was prepped and draped in a sterile fashion. 1% lidocaine was used for local anesthesia. Using the modified Seldinger access technique, a 5 French sheath was placed in the right radial artery. 3 mg Verapamil was given through the sheath. 4000 units IV heparin was given. Standard diagnostic catheters were used to perform selective coronary angiography. A pigtail catheter was used to perform a left ventricular angiogram. He was found to have severe stenosis in the mid Circumflex and in the mid LAD. I elected to proceed to PCI of both vessels.   PCI Note: He was given an additional 6000 units of IV heparin. He was given Plavix 600 mg po x 1. When the ACT was over 200, I engaged the left main with a XB LAD 3.5 guiding catheter.   Lesion #1: (mid Circumflex): I advanced a Cougar IC wire down the Circumflex. A 2.5 x 12  mm balloon was used to pre-dilate the severe stenosis in the mid Circumflex artery. I then carefully positioned and deployed a 3.5 x 20 mm Promus Premier DES in the mid  Circumflex. The stent was post-dilated with a 3.75 x 15 mm Kanab balloon x 1. The stenosis was taken from 99% down to 0%.   Lesion #2 (mid LAD): I then pulled the wire back and advanced down the LAD. A 2.5 x 12 mm balloon was used to pre-dilate the mid LAD stenosis. I then carefully positioned a 2.75 x 16 mm Promus Premier DES in the mid LAD, overlapping with the old stent on the distal edge of the old stent. The stent was post-dilated with a 3.0 x 12 mm Cherry balloon x 1. The stenosis was taken from 80% down to 0%.   The sheath was removed from the right radial artery and a Terumo hemostasis band was applied at the arteriotomy site on the right wrist. There were no immediate complications. The patient was taken to the recovery area in stable condition.   Hemodynamic Findings: Central aortic pressure: 99/68 Left ventricular pressure: 99/5/12  Angiographic Findings:  Left main: No obstructive disease.   Left Anterior Descending Artery: Large caliber vessel that courses to the apex. There is mild plaque in the proximal vessel.The mid vessel has a patent stent without restenosis. Just beyond the stented segment in the mid LAD there is a focal 80% stenosis. This is seen in several views and is felt to be flow limiting.   Circumflex Artery: Large caliber vessel with small first obtuse marginal branch and large second obtuse marginal branch. The mid AV groove Circumflex has a 99% stenosis. The superior branch of OM2 has a focal 40% stenosis, unchanged from last cath.   Right Coronary Artery: Large dominant vessel with diffuse 50% proximal stenosis, unchanged from last cath.   Left Ventricular Angiogram: LVEF=60-65%.   Impression: 1. Triple vessel CAD 2. Severe stenosis in mid Circumflex, now s/p successful PTCA/DES x 1 mid Circumflex 3. Severe stenosis mid LAD, now s/p successful PTCA/DES x 1 mid LAD 4. Unstable angina 5. Normal LV function  Recommendations: Dual anti-platelet therapy with ASA  and Plavix for at least one year. Continue beta blocker and statin. Smoking cessation.       Assessment/Plan: 1. Post 2 vessel PCI - on DAPT for the next one year - most likely lifelong - recheck follow up labs today. He is doing well clinically.   2. Tobacco abuse -  Not ready to stop.   3. HLD - on high dose Lipitor -  Recheck labs today  Current medicines are reviewed with the patient today.  The patient does not have concerns regarding medicines other than what has been noted above.  The following changes have been made:  See above.  Labs/ tests ordered today include:    Orders Placed This Encounter  Procedures  . Basic metabolic panel  . CBC  . Hepatic function panel  . Lipid panel     Disposition:   FU with Dr. Clifton James in 3 months  Patient is agreeable to this plan and will call if any problems develop in the interim.   Signed: Rosalio Macadamia, RN, ANP-C 11/04/2014 2:00 PM  [facility] [address] [place], [place]  [address] Phone: [phone] Fax: [phone]

## 2014-11-04 NOTE — Patient Instructions (Addendum)
We will be checking the following labs today BMET, lipids, HPF & CBC  Stay on your current medicines  Smoking cessation is recommended  See Dr. Clifton James in 3 months  Call the [facility] office at [phone] if you have any questions, problems or concerns.

## 2015-02-10 ENCOUNTER — Encounter: Payer: Self-pay | Admitting: Cardiovascular Disease

## 2015-02-10 ENCOUNTER — Ambulatory Visit (INDEPENDENT_AMBULATORY_CARE_PROVIDER_SITE_OTHER): Payer: BLUE CROSS/BLUE SHIELD | Admitting: Cardiovascular Disease

## 2015-02-10 VITALS — BP 114/72 | HR 80 | Ht 71.0 in | Wt 166.8 lb

## 2015-02-10 DIAGNOSIS — I2510 Atherosclerotic heart disease of native coronary artery without angina pectoris: Secondary | ICD-10-CM

## 2015-02-10 DIAGNOSIS — Z720 Tobacco use: Secondary | ICD-10-CM

## 2015-02-10 DIAGNOSIS — I10 Essential (primary) hypertension: Secondary | ICD-10-CM | POA: Diagnosis not present

## 2015-02-10 DIAGNOSIS — E782 Mixed hyperlipidemia: Secondary | ICD-10-CM

## 2015-02-10 DIAGNOSIS — R0600 Dyspnea, unspecified: Secondary | ICD-10-CM

## 2015-02-10 NOTE — Progress Notes (Signed)
Chief Complaint  Patient presents with  . Shortness of Breath    History of Present Illness: 59 yo WM with history of tobacco abuse, CAD , HTN and hyperlipidemia admitted to [facility] 09/16/10 with chest pain. He ruled in for a NSTEMI with cardiac enzymes. He underwent diagnostic heart cath on 09/17/10 and was found to have a severe stenosis of the LAD. Dr. Riley Kill placed a drug eluting stent in the LAD. He was also found to have moderate disease in the RCA (70%) and Circumflex (70%) managed medically. He came back in May 2012 and described overall fatigue, feeling constantly tired, some dyspnea as well as chest pain, left sided and occurring almost daily. I arranged a repeat cardiac cath on March 12, 2011 which showed patent LAD stent and moderate disease in the RCA and OM2. I flow wired the RCA and it was not flow limiting. FFR was 0. 97. The patient had 3 admissions in July 2013 with severe epistaxis causing hemorrhagic shock with blood loss tachycardia and drop in blood pressure. This was treated with packing and transfusion. His Plavix was stopped. Admitted January 2016 with unstable angina. Cardiac cath January 2016 with severe mid LAD stenosis, severe mid Circumflex stenosis. I placed a DES in the mid LAD and a DES in the mid Circumflex. LV function normal.   He is here today for follow up. He has had no chest pain.  Still with dyspnea. He is smoking 2ppd.   Primary Care Physician: Dr. Oliver Barre  Last Lipid Profile:Lipid Panel     Component Value Date/Time   CHOL 127 11/04/2014 1411   TRIG 154.0* 11/04/2014 1411   TRIG 82 08/15/2006 0929   HDL 49.20 11/04/2014 1411   CHOLHDL 3 11/04/2014 1411   CHOLHDL 4.3 CALC 08/15/2006 0929   VLDL 30.8 11/04/2014 1411   LDLCALC 47 11/04/2014 1411    Past Medical History  Diagnosis Date  . HYPERLIPIDEMIA-MIXED 10/04/2010  . TOBACCO ABUSE 10/04/2010  . HYPERTENSION, BENIGN 10/04/2010  . CAD, NATIVE VESSEL 10/04/2010  . Alcohol abuse  12/27/2010  . Allergic rhinitis, cause unspecified 12/27/2010  . Anal warts 04/11/2012  . Myocardial infarction 09/16/2010  . COPD (chronic obstructive pulmonary disease)     "a touch" (09/28/2013)  . Pneumonia     "when I was a kid"  . History of blood transfusion 03/2012    related to nose bleed    Past Surgical History  Procedure Laterality Date  . Finger surgery Left     "almost cut off" tip of 2nd digit  . Vasectomy    . Coronary angioplasty with stent placement  09/17/2010; 09/28/2014    "1; 2"  . Cardiac catheterization  ~ 2012  . Nasal endoscopy with epistaxis control Bilateral 03/2012  . Left heart catheterization with coronary angiogram N/A 09/28/2014    Procedure: LEFT HEART CATHETERIZATION WITH CORONARY ANGIOGRAM;  Surgeon: Kathleene Hazel, MD;  Location: [facility] CATH LAB;  Service: Cardiovascular;  Laterality: N/A;    Current Outpatient Prescriptions  Medication Sig Dispense Refill  . aspirin EC 81 MG tablet Take 81 mg by mouth daily.    Marland Kitchen atorvastatin (LIPITOR) 80 MG tablet Take 1 tablet (80 mg total) by mouth daily. 90 tablet 3  . carvedilol (COREG) 3.125 MG tablet Take 1 tablet (3.125 mg total) by mouth 2 (two) times daily with a meal. 180 tablet 2  . clopidogrel (PLAVIX) 75 MG tablet Take 1 tablet (75 mg total) by mouth daily with  breakfast. 90 tablet 3  . Multiple Vitamin (MULTIVITAMIN WITH MINERALS) TABS tablet Take 1 tablet by mouth daily.    . nitroGLYCERIN (NITROSTAT) 0.4 MG SL tablet Place 1 tablet (0.4 mg total) under the tongue every 5 (five) minutes as needed for chest pain. 1 tablet under tongue at onset of chest pain, you may repeat every 5 minutes for up to 3 doses. 25 tablet 6   No current facility-administered medications for this visit.    Allergies  Allergen Reactions  . Penicillins Other (See Comments)    Passes out    History   Social History  . Marital Status: Single    Spouse Name: N/A  . Number of Children: N/A  . Years of Education: N/A    Occupational History  . Electrical maintenance work      [employer]   Social History Main Topics  . Smoking status: Current Every Day Smoker -- 2.00 packs/day for 45 years    Types: Cigarettes  . Smokeless tobacco: Never Used     Comment: 2 plus packs per day. He has a 100 + pack - year history of tobacco abuse currently. Former 4 ppd for 25 years.  . Alcohol Use: 86.4 oz/week    144 Cans of beer per week     Comment: 09/28/2014 "12 pack of beer per night"  . Drug Use: No  . Sexual Activity: Yes    Birth Control/ Protection: None   Other Topics Concern  . Not on file   Social History Narrative   The patient lives in [place] with his girlfriend. 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    Family History  Problem Relation Age of Onset  . Heart disease Father   . Brain cancer Mother     Review of Systems:  As stated in the HPI and otherwise negative.   BP 114/72 mmHg  Pulse 80  Ht  (1.803 m)  Wt 166 lb 12.8 oz (75.66 kg)  BMI 23.27 kg/m2  Physical Examination: General: Well developed, well nourished, NAD HEENT: OP clear, mucus membranes moist SKIN: warm, dry. No rashes. Neuro: No focal deficits Musculoskeletal: Muscle strength 5/5 all ext Psychiatric: Mood and affect normal Neck: No JVD, no carotid bruits, no thyromegaly, no lymphadenopathy. Lungs:Clear bilaterally, no wheezes, rhonci, crackles Cardiovascular: Regular rate and rhythm. No murmurs, gallops or rubs. Abdomen:Soft. Bowel sounds present. Non-tender.  Extremities: No lower extremity edema. Pulses are 2 + in the bilateral DP/PT.  Cardiac cath January 2016: PCI Note: He was given an additional 6000 units of IV heparin. He was given Plavix 600 mg po x 1. When the ACT was over 200, I engaged the left main with a XB LAD 3.5 guiding catheter.  Lesion #1: (mid Circumflex): I advanced a Cougar IC wire down the Circumflex. A 2.5 x 12 mm balloon was used to pre-dilate the severe  stenosis in the mid Circumflex artery. I then carefully positioned and deployed a 3.5 x 20 mm Promus Premier DES in the mid Circumflex. The stent was post-dilated with a 3.75 x 15 mm [place] balloon x 1. The stenosis was taken from 99% down to 0%.  Lesion #2 (mid LAD): I then pulled the wire back and advanced down the LAD. A 2.5 x 12 mm balloon was used to pre-dilate the mid LAD stenosis. I then carefully positioned a 2.75 x 16 mm Promus Premier DES in the mid LAD, overlapping with the  old stent on the distal edge of the old stent. The stent was post-dilated with a 3.0 x 12 mm [place] balloon x 1. The stenosis was taken from 80% down to 0%.  The sheath was removed from the right radial artery and a Terumo hemostasis band was applied at the arteriotomy site on the right wrist. There were no immediate complications. The patient was taken to the recovery area in stable condition.   Hemodynamic Findings: Central aortic pressure: 99/68 Left ventricular pressure: 99/5/12 Angiographic Findings: Left main: No obstructive disease.  Left Anterior Descending Artery: Large caliber vessel that courses to the apex. There is mild plaque in the proximal vessel.The mid vessel has a patent stent without restenosis. Just beyond the stented segment in the mid LAD there is a focal 80% stenosis. This is seen in several views and is felt to be flow limiting.  Circumflex Artery: Large caliber vessel with small first obtuse marginal branch and large second obtuse marginal branch. The mid AV groove Circumflex has a 99% stenosis. The superior branch of OM2 has a focal 40% stenosis, unchanged from last cath.  Right Coronary Artery: Large dominant vessel with diffuse 50% proximal stenosis, unchanged from last cath.  Left Ventricular Angiogram: LVEF=60-65%.   EKG:  EKG is not ordered today. The ekg ordered today demonstrates   Recent Labs: 11/04/2014: ALT 21; BUN 12; Creatinine 0.94; Hemoglobin 15.7; Platelets 209.0; Potassium 4.0;  Sodium 139   Lipid Panel    Component Value Date/Time   CHOL 127 11/04/2014 1411   TRIG 154.0* 11/04/2014 1411   TRIG 82 08/15/2006 0929   HDL 49.20 11/04/2014 1411   CHOLHDL 3 11/04/2014 1411   CHOLHDL 4.3 CALC 08/15/2006 0929   VLDL 30.8 11/04/2014 1411   LDLCALC 47 11/04/2014 1411     Wt Readings from Last 3 Encounters:  02/10/15 166 lb 12.8 oz (75.66 kg)  11/04/14 172 lb 8 oz (78.245 kg)  09/29/14 173 lb 11.6 oz (78.8 kg)     Other studies Reviewed: Additional studies/ records that were reviewed today include: . Review of the above records demonstrates:    Assessment and Plan:   1. CAD:   Continue ASA, and statin. Hold Coreg for one week to see if dyspnea and fatigue improves. Plavix was stopped in July 2013 after he had severe epistaxis.   2. Tobacco abuse: He is advised to stop smoking. 10 minutes counseling on cessation. He does not wish to stop.   3. HTN: BP controlled. No changes today.   4. Hyperlipidemia: Continue statin. Lipids well controlled.   5. Dyspnea: Likely due to his ongoing tobacco abuse, COPD. I advised him to seek another opinion in primary care. He may need PFTs. He will likely have progression of his lung disease and CAD with ongoing tobacco abuse.   Current medicines are reviewed at length with the patient today.  The patient does not have concerns regarding medicines.  The following changes have been made:  no change  Labs/ tests ordered today include:  No orders of the defined types were placed in this encounter.    Disposition:   FU with me in 6 months  Signed, Verne Carrow, MD 02/10/2015 4:48 PM    [facility] [address] [place], [place], [place]  [address] Phone: [phone]; Fax: [phone]

## 2015-02-10 NOTE — Patient Instructions (Signed)
Medication Instructions:  Your physician recommends that you continue on your current medications as directed. Please refer to the Current Medication list given to you today.   Labwork: none  Testing/Procedures: none  Follow-Up: Your physician wants you to follow-up in: 6 months.  You will receive a reminder letter in the mail two months in advance. If you don't receive a letter, please call our office to schedule the follow-up appointment.       

## 2015-02-17 ENCOUNTER — Telehealth: Payer: Self-pay | Admitting: Cardiovascular Disease

## 2015-02-17 MED ORDER — NITROGLYCERIN 0.4 MG SL SUBL: 0.4000 mg | SUBLINGUAL_TABLET | SUBLINGUAL | Status: DC | PRN

## 2015-02-17 NOTE — Telephone Encounter (Signed)
New Message  Pt wanted to notify our office/Dr. Arther Abbott that since stopping certiain medication. Pt feels better with more energy.

## 2015-02-17 NOTE — Telephone Encounter (Signed)
Calling to let Dr. Clifton James know that since stopping his Carvedilol he feels great.  Has more energy.  Advised will forward message to him.  Also would like refill on NTG.  Will send to [facility] on [address]. Sidney [name]

## 2015-02-20 NOTE — Telephone Encounter (Signed)
Thanks. cdm 

## 2015-10-05 ENCOUNTER — Encounter: Payer: Self-pay | Admitting: Cardiovascular Disease

## 2015-10-05 ENCOUNTER — Ambulatory Visit (INDEPENDENT_AMBULATORY_CARE_PROVIDER_SITE_OTHER): Payer: BLUE CROSS/BLUE SHIELD | Admitting: Cardiovascular Disease

## 2015-10-05 VITALS — BP 118/76 | HR 85 | Ht 71.0 in | Wt 162.8 lb

## 2015-10-05 DIAGNOSIS — Z720 Tobacco use: Secondary | ICD-10-CM

## 2015-10-05 DIAGNOSIS — E782 Mixed hyperlipidemia: Secondary | ICD-10-CM

## 2015-10-05 DIAGNOSIS — I10 Essential (primary) hypertension: Secondary | ICD-10-CM

## 2015-10-05 DIAGNOSIS — I2510 Atherosclerotic heart disease of native coronary artery without angina pectoris: Secondary | ICD-10-CM

## 2015-10-05 MED ORDER — ATORVASTATIN CALCIUM 80 MG PO TABS: 80.0000 mg | ORAL_TABLET | Freq: Every day | ORAL | Status: DC

## 2015-10-05 MED ORDER — NITROGLYCERIN 0.4 MG SL SUBL: 0.4000 mg | SUBLINGUAL_TABLET | SUBLINGUAL | Status: DC | PRN

## 2015-10-05 MED ORDER — CLOPIDOGREL BISULFATE 75 MG PO TABS: 75.0000 mg | ORAL_TABLET | Freq: Every day | ORAL | Status: DC

## 2015-10-05 NOTE — Progress Notes (Signed)
Chief Complaint  Patient presents with  . Follow-up    pt has no complaints  . Coronary Artery Disease    History of Present Illness: 60 yo WM with history of tobacco abuse, CAD , HTN and hyperlipidemia admitted to [facility] 09/16/10 with chest pain. He ruled in for a NSTEMI with cardiac enzymes. He underwent diagnostic heart cath on 09/17/10 and was found to have a severe stenosis of the LAD. Dr. Riley Kill placed a drug eluting stent in the LAD. He was also found to have moderate disease in the RCA (70%) and Circumflex (70%) managed medically. He came back in May 2012 and described overall fatigue, feeling constantly tired, some dyspnea as well as chest pain, left sided and occurring almost daily. I arranged a repeat cardiac cath on March 12, 2011 which showed patent LAD stent and moderate disease in the RCA and OM2. I flow wired the RCA and it was not flow limiting. FFR was 0. 97. The patient had 3 admissions in July 2013 with severe epistaxis causing hemorrhagic shock with blood loss tachycardia and drop in blood pressure. This was treated with packing and transfusion. His Plavix was stopped. Admitted January 2016 with unstable angina. Cardiac cath January 2016 with severe mid LAD stenosis, severe mid Circumflex stenosis. I placed a DES in the mid LAD and a DES in the mid Circumflex. LV function normal. I saw him in May 2016 and he c/o fatigue and dyspnea. I asked him to hold his Coreg for a week to see if this helped. He was also asked to discuss dyspnea with primary care as he likely has a component of COPD. He did not follow up with Dr. Jonny Ruiz as asked.   He is here today for follow up. He feels great off of the Coreg. He has had no chest pain. His energy level is better. He has no dyspnea. He is still smoking 2ppd. He reports easy bruising on his hands.   Primary Care Physician: Dr. Oliver Barre   Past Medical History  Diagnosis Date  . HYPERLIPIDEMIA-MIXED 10/04/2010  . TOBACCO ABUSE  10/04/2010  . HYPERTENSION, BENIGN 10/04/2010  . CAD, NATIVE VESSEL 10/04/2010  . Alcohol abuse 12/27/2010  . Allergic rhinitis, cause unspecified 12/27/2010  . Anal warts 04/11/2012  . Myocardial infarction (HCC) 09/16/2010  . COPD (chronic obstructive pulmonary disease) (HCC)     "a touch" (09/28/2013)  . Pneumonia     "when I was a kid"  . History of blood transfusion 03/2012    related to nose bleed    Past Surgical History  Procedure Laterality Date  . Finger surgery Left     "almost cut off" tip of 2nd digit  . Vasectomy    . Coronary angioplasty with stent placement  09/17/2010; 09/28/2014    "1; 2"  . Cardiac catheterization  ~ 2012  . Nasal endoscopy with epistaxis control Bilateral 03/2012  . Left heart catheterization with coronary angiogram N/A 09/28/2014    Procedure: LEFT HEART CATHETERIZATION WITH CORONARY ANGIOGRAM;  Surgeon: Kathleene Hazel, MD;  Location: [facility] CATH LAB;  Service: Cardiovascular;  Laterality: N/A;    Current Outpatient Prescriptions  Medication Sig Dispense Refill  . atorvastatin (LIPITOR) 80 MG tablet Take 1 tablet (80 mg total) by mouth daily. 90 tablet 3  . clopidogrel (PLAVIX) 75 MG tablet Take 1 tablet (75 mg total) by mouth daily with breakfast. 90 tablet 3  . Multiple Vitamin (MULTIVITAMIN WITH MINERALS) TABS tablet Take 1  tablet by mouth daily.    . nitroGLYCERIN (NITROSTAT) 0.4 MG SL tablet Place 1 tablet (0.4 mg total) under the tongue every 5 (five) minutes as needed for chest pain. 25 tablet 6   No current facility-administered medications for this visit.    Allergies  Allergen Reactions  . Penicillins Other (See Comments)    Passes out    Social History   Social History  . Marital Status: Single    Spouse Name: N/A  . Number of Children: N/A  . Years of Education: N/A   Occupational History  . Electrical maintenance work      [employer]   Social History Main Topics  . Smoking status: Current Every Day Smoker -- 2.00  packs/day for 45 years    Types: Cigarettes  . Smokeless tobacco: Never Used     Comment: 2 plus packs per day. He has a 100 + pack - year history of tobacco abuse currently. Former 4 ppd for 25 years.  . Alcohol Use: 86.4 oz/week    144 Cans of beer per week     Comment: 09/28/2014 "12 pack of beer per night"  . Drug Use: No  . Sexual Activity: Yes    Birth Control/ Protection: None   Other Topics Concern  . Not on file   Social History Narrative   The patient lives in [place] with his girlfriend. 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    Family History  Problem Relation Age of Onset  . Heart disease Father   . Brain cancer Mother     Review of Systems:  As stated in the HPI and otherwise negative.   BP 118/76 mmHg  Pulse 85  Ht 5\' 11"  (1.803 m)  Wt 162 lb 12.8 oz (73.846 kg)  BMI 22.72 kg/m2  Physical Examination: General: Well developed, well nourished, NAD HEENT: OP clear, mucus membranes moist SKIN: warm, dry. No rashes. Neuro: No focal deficits Musculoskeletal: Muscle strength 5/5 all ext Psychiatric: Mood and affect normal Neck: No JVD, no carotid bruits, no thyromegaly, no lymphadenopathy. Lungs:Clear bilaterally, no wheezes, rhonci, crackles Cardiovascular: Regular rate and rhythm. No murmurs, gallops or rubs. Abdomen:Soft. Bowel sounds present. Non-tender.  Extremities: No lower extremity edema. Pulses are 2 + in the bilateral DP/PT.  Cardiac cath January 2016: PCI Note: He was given an additional 6000 units of IV heparin. He was given Plavix 600 mg po x 1. When the ACT was over 200, I engaged the left main with a XB LAD 3.5 guiding catheter.  Lesion #1: (mid Circumflex): I advanced a Cougar IC wire down the Circumflex. A 2.5 x 12 mm balloon was used to pre-dilate the severe stenosis in the mid Circumflex artery. I then carefully positioned and deployed a 3.5 x 20 mm Promus Premier DES in the mid Circumflex. The stent was  post-dilated with a 3.75 x 15 mm [place] balloon x 1. The stenosis was taken from 99% down to 0%.  Lesion #2 (mid LAD): I then pulled the wire back and advanced down the LAD. A 2.5 x 12 mm balloon was used to pre-dilate the mid LAD stenosis. I then carefully positioned a 2.75 x 16 mm Promus Premier DES in the mid LAD, overlapping with the old stent on the distal edge of the old stent. The stent was post-dilated with a 3.0 x 12 mm [place] balloon x 1. The stenosis was taken from 80% down to 0%.  The sheath was removed from the right radial artery and a Terumo hemostasis band was applied at the arteriotomy site on the right wrist. There were no immediate complications. The patient was taken to the recovery area in stable condition.   Hemodynamic Findings: Central aortic pressure: 99/68 Left ventricular pressure: 99/5/12 Angiographic Findings: Left main: No obstructive disease.  Left Anterior Descending Artery: Large caliber vessel that courses to the apex. There is mild plaque in the proximal vessel.The mid vessel has a patent stent without restenosis. Just beyond the stented segment in the mid LAD there is a focal 80% stenosis. This is seen in several views and is felt to be flow limiting.  Circumflex Artery: Large caliber vessel with small first obtuse marginal branch and large second obtuse marginal branch. The mid AV groove Circumflex has a 99% stenosis. The superior branch of OM2 has a focal 40% stenosis, unchanged from last cath.  Right Coronary Artery: Large dominant vessel with diffuse 50% proximal stenosis, unchanged from last cath.  Left Ventricular Angiogram: LVEF=60-65%.   EKG:  EKG is ordered today. The ekg ordered today demonstrates NSR, rate 85 bpm  Recent Labs: 11/04/2014: ALT 21; BUN 12; Creatinine, Ser 0.94; Hemoglobin 15.7; Platelets 209.0; Potassium 4.0; Sodium 139   Lipid Panel    Component Value Date/Time   CHOL 127 11/04/2014 1411   TRIG 154.0* 11/04/2014 1411   TRIG 82  08/15/2006 0929   HDL 49.20 11/04/2014 1411   CHOLHDL 3 11/04/2014 1411   CHOLHDL 4.3 CALC 08/15/2006 0929   VLDL 30.8 11/04/2014 1411   LDLCALC 47 11/04/2014 1411     Wt Readings from Last 3 Encounters:  10/05/15 162 lb 12.8 oz (73.846 kg)  02/10/15 166 lb 12.8 oz (75.66 kg)  11/04/14 172 lb 8 oz (78.245 kg)     Other studies Reviewed: Additional studies/ records that were reviewed today include: . Review of the above records demonstrates:    Assessment and Plan:   1. CAD:   Continue Plavix and statin. Coreg stopped due to fatigue. Will stop ASA due to easy bruising.   2. Tobacco abuse: He is advised to stop smoking. 10 minutes counseling on cessation. He does not wish to stop.   3. HTN: BP controlled. No changes today.   4. Hyperlipidemia: Continue statin. Lipids well controlled. Will check lipids and LFTs next month.   Current medicines are reviewed at length with the patient today.  The patient does not have concerns regarding medicines.  The following changes have been made:  no change  Labs/ tests ordered today include:   Orders Placed This Encounter  Procedures  . Lipid Profile  . Hepatic function panel  . EKG 12-Lead    Disposition:   FU with me in 6 months  Signed, Verne Carrow, MD 10/05/2015 3:34 PM    [facility] [address] [place], [place], [place]  [address] Phone: [phone]; Fax: [phone]

## 2015-10-05 NOTE — Patient Instructions (Addendum)
Medication Instructions:  Your physician has recommended you make the following change in your medication: Stop aspirin    Labwork: Your physician recommends that you return for fasting lab work on February 10,2017.  The lab opens at 7:30 AM   Testing/Procedures: none  Follow-Up: Your physician wants you to follow-up in:  6 months. You will receive a reminder letter in the mail two months in advance. If you don't receive a letter, please call our office to schedule the follow-up appointment.        If you need a refill on your cardiac medications before your next appointment, please call your pharmacy.

## 2015-11-03 ENCOUNTER — Other Ambulatory Visit (INDEPENDENT_AMBULATORY_CARE_PROVIDER_SITE_OTHER): Payer: BLUE CROSS/BLUE SHIELD | Admitting: *Deleted

## 2015-11-03 DIAGNOSIS — E782 Mixed hyperlipidemia: Secondary | ICD-10-CM | POA: Diagnosis not present

## 2015-11-03 LAB — HEPATIC FUNCTION PANEL
ALT: 24 U/L (ref 9–46)
AST: 30 U/L (ref 10–35)
Albumin: 4 g/dL (ref 3.6–5.1)
Alkaline Phosphatase: 123 U/L — ABNORMAL HIGH (ref 40–115)
BILIRUBIN INDIRECT: 0.4 mg/dL (ref 0.2–1.2)
BILIRUBIN TOTAL: 0.5 mg/dL (ref 0.2–1.2)
Bilirubin, Direct: 0.1 mg/dL (ref <=0.2)
Total Protein: 6.8 g/dL (ref 6.1–8.1)

## 2015-11-03 LAB — LIPID PANEL
Cholesterol: 142 mg/dL (ref 125–200)
HDL: 62 mg/dL (ref >=40)
LDL CALC: 57 mg/dL (ref <130)
Total CHOL/HDL Ratio: 2.3 Ratio (ref <=5.0)
Triglycerides: 116 mg/dL (ref <150)
VLDL: 23 mg/dL (ref <30)

## 2016-07-19 ENCOUNTER — Encounter: Payer: Self-pay | Admitting: Cardiovascular Disease

## 2016-07-19 ENCOUNTER — Ambulatory Visit (INDEPENDENT_AMBULATORY_CARE_PROVIDER_SITE_OTHER): Payer: BLUE CROSS/BLUE SHIELD | Admitting: Cardiovascular Disease

## 2016-07-19 VITALS — BP 146/84 | HR 100 | Ht 71.0 in | Wt 158.0 lb

## 2016-07-19 DIAGNOSIS — Z720 Tobacco use: Secondary | ICD-10-CM | POA: Diagnosis not present

## 2016-07-19 DIAGNOSIS — I10 Essential (primary) hypertension: Secondary | ICD-10-CM

## 2016-07-19 DIAGNOSIS — I2510 Atherosclerotic heart disease of native coronary artery without angina pectoris: Secondary | ICD-10-CM | POA: Diagnosis not present

## 2016-07-19 DIAGNOSIS — E782 Mixed hyperlipidemia: Secondary | ICD-10-CM

## 2016-07-19 MED ORDER — CLOPIDOGREL BISULFATE 75 MG PO TABS: 75.0000 mg | ORAL_TABLET | Freq: Every day | ORAL | 3 refills | Status: DC

## 2016-07-19 MED ORDER — ATORVASTATIN CALCIUM 80 MG PO TABS: 80.0000 mg | ORAL_TABLET | Freq: Every day | ORAL | 3 refills | Status: DC

## 2016-07-19 MED ORDER — NITROGLYCERIN 0.4 MG SL SUBL: 0.4000 mg | SUBLINGUAL_TABLET | SUBLINGUAL | 6 refills | Status: DC | PRN

## 2016-07-19 NOTE — Patient Instructions (Signed)

## 2016-07-19 NOTE — Progress Notes (Signed)
Chief Complaint  Patient presents with  . Follow-up    pt states no chest pain   . Shortness of Breath    pt staes when doing anything he gets SOB    History of Present Illness: 60 yo WM with history of tobacco abuse, CAD , HTN and hyperlipidemia who is here today for cardiac follow up. He was admitted to [facility] 09/16/10 with a NSTEMI. Cardiac cath 09/17/10 and was found to have a severe stenosis of the LAD. Dr. Riley Kill placed a drug eluting stent in the LAD. He was also found to have moderate disease in the RCA (70%) and Circumflex (70%) managed medically. He came back in May 2012 and described overall fatigue, feeling constantly tired, some dyspnea as well as chest pain, left sided and occurring almost daily. I arranged a repeat cardiac cath on March 12, 2011 which showed patent LAD stent and moderate disease in the RCA and OM2. I flow wired the RCA and it was not flow limiting. FFR was 0. 97. The patient had 3 admissions in July 2013 with severe epistaxis causing hemorrhagic shock with blood loss tachycardia and drop in blood pressure. This was treated with packing and transfusion. His Plavix was stopped. Admitted January 2016 with unstable angina. Cardiac cath January 2016 with severe mid LAD stenosis, severe mid Circumflex stenosis. I placed a DES in the mid LAD and a DES in the mid Circumflex. LV function normal. I saw him in May 2016 and he c/o fatigue and dyspnea. I asked him to hold his Coreg for a week to see if this helped. He was also asked to discuss dyspnea with primary care as he likely has a component of COPD. He did not follow up with Dr. Jonny Ruiz as asked.   He is here today for follow up. He feels great off of the Coreg. He has had no chest pain. His energy level is better. He has no dyspnea. He is still smoking 2ppd.   Primary Care Physician: Oliver Barre, MD   Past Medical History:  Diagnosis Date  . Alcohol abuse 12/27/2010  . Allergic rhinitis, cause unspecified  12/27/2010  . Anal warts 04/11/2012  . CAD, NATIVE VESSEL 10/04/2010  . COPD (chronic obstructive pulmonary disease) (HCC)    "a touch" (09/28/2013)  . History of blood transfusion 03/2012   related to nose bleed  . HYPERLIPIDEMIA-MIXED 10/04/2010  . HYPERTENSION, BENIGN 10/04/2010  . Myocardial infarction 09/16/2010  . Pneumonia    "when I was a kid"  . TOBACCO ABUSE 10/04/2010    Past Surgical History:  Procedure Laterality Date  . CARDIAC CATHETERIZATION  ~ 2012  . CORONARY ANGIOPLASTY WITH STENT PLACEMENT  09/17/2010; 09/28/2014   "1; 2"  . FINGER SURGERY Left    "almost cut off" tip of 2nd digit  . LEFT HEART CATHETERIZATION WITH CORONARY ANGIOGRAM N/A 09/28/2014   Procedure: LEFT HEART CATHETERIZATION WITH CORONARY ANGIOGRAM;  Surgeon: Kathleene Hazel, MD;  Location: [facility] CATH LAB;  Service: Cardiovascular;  Laterality: N/A;  . NASAL ENDOSCOPY WITH EPISTAXIS CONTROL Bilateral 03/2012  . VASECTOMY      Current Outpatient Prescriptions  Medication Sig Dispense Refill  . atorvastatin (LIPITOR) 80 MG tablet Take 1 tablet (80 mg total) by mouth daily. 90 tablet 3  . clopidogrel (PLAVIX) 75 MG tablet Take 1 tablet (75 mg total) by mouth daily with breakfast. 90 tablet 3  . Multiple Vitamin (MULTIVITAMIN WITH MINERALS) TABS tablet Take 1 tablet by mouth daily.    Marland Kitchen  nitroGLYCERIN (NITROSTAT) 0.4 MG SL tablet Place 1 tablet (0.4 mg total) under the tongue every 5 (five) minutes as needed for chest pain. 25 tablet 6   No current facility-administered medications for this visit.     Allergies  Allergen Reactions  . Penicillins Other (See Comments)    Passes out    Social History   Social History  . Marital status: Single    Spouse name: N/A  . Number of children: N/A  . Years of education: N/A   Occupational History  . Electrical maintenance work  [employer]   Social History Main Topics  . Smoking status: Current Every Day Smoker    Packs/day: 2.00    Years:  45.00    Types: Cigarettes  . Smokeless tobacco: Never Used     Comment: 2 plus packs per day. He has a 100 + pack - year history of tobacco abuse currently. Former 4 ppd for 25 years.  . Alcohol use 86.4 oz/week    144 Cans of beer per week     Comment: 09/28/2014 "12 pack of beer per night"  . Drug use: No  . Sexual activity: Yes    Birth control/ protection: None   Other Topics Concern  . Not on file   Social History Narrative   The [place] in [place] with his girlfriend. 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 He is not routinely exercising.    Family History  Problem Relation Age of Onset  . Heart disease Father   . Brain cancer Mother     Review of Systems:  As stated in the HPI and otherwise negative.   BP (!) 146/84   Pulse 100   Ht 5\' 11"  (1.803 m)   Wt 158 lb (71.7 kg)   BMI 22.04 kg/m   Physical Examination: General: Well developed, well nourished, NAD  HEENT: OP clear, mucus membranes moist  SKIN: warm, dry. No rashes. Neuro: No focal deficits  Musculoskeletal: Muscle strength 5/5 all ext  Psychiatric: Mood and affect normal  Neck: No JVD, no carotid bruits, no thyromegaly, no lymphadenopathy.  Lungs:Clear bilaterally, no wheezes, rhonci, crackles Cardiovascular: Regular rate and rhythm. No murmurs, gallops or rubs. Abdomen:Soft. Bowel sounds present. Non-tender.  Extremities: No lower extremity edema. Pulses are 2 + in the bilateral DP/PT.  Cardiac cath January 2016: PCI Note: He was given an additional 6000 units of IV heparin. He was given Plavix 600 mg po x 1. When the ACT was over 200, I engaged the left main with a XB LAD 3.5 guiding catheter.  Lesion #1: (mid Circumflex): I advanced a Cougar IC wire down the Circumflex. A 2.5 x 12 mm balloon was used to pre-dilate the severe stenosis in the mid Circumflex artery. I then carefully positioned and deployed a 3.5 x 20 mm Promus Premier DES in the mid Circumflex. The stent was post-dilated with a 3.75 x  15 mm [place] balloon x 1. The stenosis was taken from 99% down to 0%.  Lesion #2 (mid LAD): I then pulled the wire back and advanced down the LAD. A 2.5 x 12 mm balloon was used to pre-dilate the mid LAD stenosis. I then carefully positioned a 2.75 x 16 mm Promus Premier DES in the mid LAD, overlapping with the old stent on the distal edge of the old stent. The stent was post-dilated with a 3.0 x 12 mm [place] balloon x 1. The stenosis was  taken from 80% down to 0%.  The sheath was removed from the right radial artery and a Terumo hemostasis band was applied at the arteriotomy site on the right wrist. There were no immediate complications. The patient was taken to the recovery area in stable condition.   Hemodynamic Findings: Central aortic pressure: 99/68 Left ventricular pressure: 99/5/12 Angiographic Findings: Left main: No obstructive disease.  Left Anterior Descending Artery: Large caliber vessel that courses to the apex. There is mild plaque in the proximal vessel.The mid vessel has a patent stent without restenosis. Just beyond the stented segment in the mid LAD there is a focal 80% stenosis. This is seen in several views and is felt to be flow limiting.  Circumflex Artery: Large caliber vessel with small first obtuse marginal branch and large second obtuse marginal branch. The mid AV groove Circumflex has a 99% stenosis. The superior branch of OM2 has a focal 40% stenosis, unchanged from last cath.  Right Coronary Artery: Large dominant vessel with diffuse 50% proximal stenosis, unchanged from last cath.  Left Ventricular Angiogram: LVEF=60-65%.   EKG:  EKG is ordered today. The ekg ordered today demonstrates NSR, rate 100 bpm.    Recent Labs: 11/03/2015: ALT 24   Lipid Panel    Component Value Date/Time   CHOL 142 11/03/2015 0733   TRIG 116 11/03/2015 0733   TRIG 82 08/15/2006 0929   HDL 62 11/03/2015 0733   CHOLHDL 2.3 11/03/2015 0733   VLDL 23 11/03/2015 0733   LDLCALC 57  11/03/2015 0733     Wt Readings from Last 3 Encounters:  07/19/16 158 lb (71.7 kg)  10/05/15 162 lb 12.8 oz (73.8 kg)  02/10/15 166 lb 12.8 oz (75.7 kg)     Other studies Reviewed: Additional studies/ records that were reviewed today include: . Review of the above records demonstrates:    Assessment and Plan:   1. CAD:   Continue Plavix and statin. Coreg stopped due to fatigue. ASA stopped due to easy bruising.   2. Tobacco abuse: He is advised to stop smoking. 10 minutes counseling on cessation. I have discussed plaque instability with ongoing smoking as well as plaque progression. He does not wish to stop.   3. HTN: BP controlled. No changes today.   4. Hyperlipidemia: Continue statin. Lipids well controlled. Will check lipids and LFTs at f/u visit in 6 months.   Current medicines are reviewed at length with the patient today.  The patient does not have concerns regarding medicines.  The following changes have been made:  no change  Labs/ tests ordered today include:   Orders Placed This Encounter  Procedures  . EKG 12-Lead    Disposition:   FU with me in 6 months  Signed, Verne Carrow, MD 07/19/2016 3:33 PM    [facility] [address] [place], [place], [place]  [address] Phone: [phone]; Fax: [phone]

## 2018-03-18 ENCOUNTER — Other Ambulatory Visit (HOSPITAL_COMMUNITY): Payer: Self-pay | Admitting: *Deleted

## 2018-03-18 ENCOUNTER — Ambulatory Visit (HOSPITAL_COMMUNITY)
Admission: RE | Admit: 2018-03-18 | Discharge: 2018-03-18 | Disposition: A | Discharge status: Home / Self Care | Payer: Self-pay | Source: Ambulatory Visit | Attending: *Deleted | Admitting: *Deleted

## 2018-03-18 DIAGNOSIS — J90 Pleural effusion, not elsewhere classified: Secondary | ICD-10-CM | POA: Insufficient documentation

## 2018-03-18 DIAGNOSIS — R0600 Dyspnea, unspecified: Secondary | ICD-10-CM

## 2018-03-18 DIAGNOSIS — R634 Abnormal weight loss: Secondary | ICD-10-CM | POA: Insufficient documentation

## 2018-03-18 DIAGNOSIS — Z955 Presence of coronary angioplasty implant and graft: Secondary | ICD-10-CM | POA: Insufficient documentation

## 2018-11-11 ENCOUNTER — Telehealth: Payer: Self-pay | Admitting: Cardiovascular Disease

## 2018-11-11 NOTE — Telephone Encounter (Signed)
I spoke with John [name] who reports swelling in both feet and ankles. Started about 2 days ago. Right is greater than left. He does not weigh daily. He reports shortness of breath for the last 2.5 years. Last seen here in October 2017.  Was recently seen in clinic in [facility] and referred back to cardiology.  Has appointment with Dr. Clifton James on April 1,2020.  John [name] reports he is not very active and sits a lot watching TV.  Eats salty foods such as fast food and processed foods frequently. I advised John [name] to limit salt intake and keep legs elevated when possible. I scheduled him to see Dr. Clifton James on 11/16/18 at 2:00.  I asked him to follow up with clinic in [facility] if swelling worsens prior to this appointment.

## 2018-11-11 NOTE — Telephone Encounter (Signed)
New message   PT is calling because he says his right foot is swollen, and his left foot is a little swollen but not like the right foot.  Please call

## 2018-11-11 NOTE — Telephone Encounter (Signed)
Thanks

## 2018-11-11 NOTE — Telephone Encounter (Signed)
Pt last seen by Dr. Clifton James 2017. I will route to Pat A. RN.

## 2018-11-16 ENCOUNTER — Ambulatory Visit (INDEPENDENT_AMBULATORY_CARE_PROVIDER_SITE_OTHER): Payer: Self-pay | Admitting: Cardiovascular Disease

## 2018-11-16 ENCOUNTER — Encounter: Payer: Self-pay | Admitting: Cardiovascular Disease

## 2018-11-16 VITALS — BP 140/86 | HR 105 | Ht 71.0 in | Wt 155.6 lb

## 2018-11-16 DIAGNOSIS — I10 Essential (primary) hypertension: Secondary | ICD-10-CM

## 2018-11-16 DIAGNOSIS — E7800 Pure hypercholesterolemia, unspecified: Secondary | ICD-10-CM

## 2018-11-16 DIAGNOSIS — I5031 Acute diastolic (congestive) heart failure: Secondary | ICD-10-CM

## 2018-11-16 DIAGNOSIS — I2510 Atherosclerotic heart disease of native coronary artery without angina pectoris: Secondary | ICD-10-CM

## 2018-11-16 MED ORDER — NITROGLYCERIN 0.4 MG SL SUBL: 0.4000 mg | SUBLINGUAL_TABLET | SUBLINGUAL | 6 refills | Status: DC | PRN

## 2018-11-16 MED ORDER — FUROSEMIDE 40 MG PO TABS: 40.0000 mg | ORAL_TABLET | Freq: Every day | ORAL | 6 refills | Status: DC

## 2018-11-16 NOTE — Patient Instructions (Signed)
Medication Instructions:  Your physician has recommended you make the following change in your medication: Start furosemide 40 mg by mouth daily.   If you need a refill on your cardiac medications before your next appointment, please call your pharmacy.   Lab work: Lab work to be done today--BMP and BNP If you have labs (blood work) drawn today and your tests are completely normal, you will receive your results only by: Marland Kitchen MyChart Message (if you have MyChart) OR . A paper copy in the mail If you have any lab test that is abnormal or we need to change your treatment, we will call you to review the results.  Testing/Procedures: none  Follow-Up: Follow up as previously scheduled with Dr. Clifton James on April 1,2020 Any Other Special Instructions Will Be Listed Below (If Applicable).

## 2018-11-16 NOTE — Progress Notes (Signed)
Chief Complaint  Patient presents with  . Follow-up    CAD   History of Present Illness: 63 yo male with history of ongoing tobacco abuse, CAD , HTN, hyperlipidemia and likely COPD who is here today for cardiac follow up. He was admitted to [facility] December 2011 with a NSTEMI. Cardiac cath 09/17/10 and was found to have a severe stenosis of the LAD which was treated with a drug eluting stent. He was also found to have moderate disease in the RCA (70%) and Circumflex (70%) managed medically. Cardiac cath June 2012 with stable CAD. He  had 3 admissions in July 2013 with severe epistaxis causing hemorrhagic shock with tachycardia and drop in blood pressure. This was treated with packing and transfusion. His Plavix was stopped. He was admitted January 2016 with unstable angina. Cardiac cath January 2016 with severe mid LAD stenosis, severe mid Circumflex stenosis. I placed a drug eluting stent in the mid LAD and a drug eluting stent in the mid Circumflex. LV function normal. I saw him in May 2016 and he c/o fatigue and dyspnea. His Coreg was stopped and he had resolution of his symptoms. He has not been seen in our office since October 2017.   He is here today for follow up. He lost his job over a year ago. Since then he has not taken any medications. He has been progressively dyspneic for a year. He can barely talk without having to stop and catch his breath. He cannot lay flat without profound dyspnea.  No chest pain. He has been seen in the Health[facility]Department in [facility] and workup has been underway for dyspnea. Mention of PFTs but no way to pay for it. He has had LE edema for 1 week. No dizziness, near syncope or syncope. He admits that he has not sought medical attention over the last year due to lack of money. He is here with his girlfriend today. He has applied for financial assistance but has been denied thus far. He has no insurance. He is smoking one pack of cigarettes per day.  He does not drink every day but does have 5-6 beers some days.   Primary Care Physician: Patient, No Pcp Per   Past Medical History:  Diagnosis Date  . Alcohol abuse 12/27/2010  . Allergic rhinitis, cause unspecified 12/27/2010  . Anal warts 04/11/2012  . CAD, NATIVE VESSEL 10/04/2010  . COPD (chronic obstructive pulmonary disease) (HCC)    "a touch" (09/28/2013)  . History of blood transfusion 03/2012   related to nose bleed  . HYPERLIPIDEMIA-MIXED 10/04/2010  . HYPERTENSION, BENIGN 10/04/2010  . Myocardial infarction (HCC) 09/16/2010  . Pneumonia    "when I was a kid"  . TOBACCO ABUSE 10/04/2010    Past Surgical History:  Procedure Laterality Date  . CARDIAC CATHETERIZATION  ~ 2012  . CORONARY ANGIOPLASTY WITH STENT PLACEMENT  09/17/2010; 09/28/2014   "1; 2"  . FINGER SURGERY Left    "almost cut off" tip of 2nd digit  . LEFT HEART CATHETERIZATION WITH CORONARY ANGIOGRAM N/A 09/28/2014   Procedure: LEFT HEART CATHETERIZATION WITH CORONARY ANGIOGRAM;  Surgeon: Kathleene Hazel, MD;  Location: [facility] CATH LAB;  Service: Cardiovascular;  Laterality: N/A;  . NASAL ENDOSCOPY WITH EPISTAXIS CONTROL Bilateral 03/2012  . VASECTOMY      Current Outpatient Medications  Medication Sig Dispense Refill  . atorvastatin (LIPITOR) 80 MG tablet Take 1 tablet (80 mg total) by mouth daily. (Patient not taking: Reported on 11/16/2018) 90  tablet 3  . clopidogrel (PLAVIX) 75 MG tablet Take 1 tablet (75 mg total) by mouth daily with breakfast. (Patient not taking: Reported on 11/16/2018) 90 tablet 3  . furosemide (LASIX) 40 MG tablet Take 1 tablet (40 mg total) by mouth daily. 30 tablet 6  . Multiple Vitamin (MULTIVITAMIN WITH MINERALS) TABS tablet Take 1 tablet by mouth daily.    . nitroGLYCERIN (NITROSTAT) 0.4 MG SL tablet Place 1 tablet (0.4 mg total) under the tongue every 5 (five) minutes as needed for chest pain. 25 tablet 6   No current facility-administered medications for this visit.     Allergies    Allergen Reactions  . Penicillins Other (See Comments)    Passes out    Social History   Socioeconomic History  . Marital status: Single    Spouse name: Not on file  . Number of children: Not on file  . Years of education: Not on file  . Highest education level: Not on file  Occupational History  . 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 Financial resource strain: Not on file  . Food insecurity:    Worry: Not on file    Inability: Not on file  . Transportation needs:    Medical: Not on file    Non-medical: Not on file  Tobacco Use  . Smoking status: Current Every Day Smoker    Packs/day: 2.00    Years: 45.00    Pack years: 90.00    Types: Cigarettes  . Smokeless tobacco: Never Used  . Tobacco comment: 2 plus packs per day. He has a 100 + pack - year history of tobacco abuse currently. Former 4 ppd for 25 years.  Substance and Sexual Activity  . Alcohol use: Yes    Alcohol/week: 144.0 standard drinks    Types: 144 Cans of beer per week    Comment: 09/28/2014 "12 pack of beer per night"  . Drug use: No  . Sexual activity: Yes    Birth control/protection: None  Lifestyle  . Physical activity:    Days per week: Not on file    Minutes per session: Not on file  . Stress: Not on file  Relationships  . Social connections:    Talks on phone: Not on file    Gets together: Not on file    Attends religious service: Not on file    Active member of club or organization: Not on file    Attends meetings of clubs or organizations: Not on file    Relationship status: Not on file  . Intimate partner violence:    Fear of current or ex partner: Not on file    Emotionally abused: Not on file    Physically abused: Not on file    Forced sexual activity: Not on file  Other Topics Concern  . Not on file  Social History Narrative   The patient lives in [place] with his girlfriend. 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  He is not routinely exercising.    Family History  Problem Relation Age of Onset  . Heart disease Father   . Brain cancer Mother     Review of Systems:  As stated in the HPI and otherwise negative.   BP 140/86   Pulse (!) 105   Ht 5\' 11"  (1.803 m)   Wt 155 lb 9.6 oz (70.6 kg)   SpO2  96%   BMI 21.70 kg/m   Physical Examination: General: Well developed, well nourished, appears dyspneic.  HEENT: OP clear, mucus membranes moist  SKIN: warm, dry. No rashes. Neuro: No focal deficits  Musculoskeletal: Muscle strength 5/5 all ext  Psychiatric: Mood and affect normal  Neck: + JVD, no carotid bruits, no thyromegaly, no lymphadenopathy.  Lungs:Decreased breath sounds bilateral lungs. No wheezes, rhonci, crackles Cardiovascular: Regular and tachy. No murmurs, gallops or rubs. Abdomen:Soft. Bowel sounds present. Non-tender.  Extremities: 1+ bilateral lower extremity edema.   Cardiac cath January 2016: PCI Note: He was given an additional 6000 units of IV heparin. He was given Plavix 600 mg po x 1. When the ACT was over 200, I engaged the left main with a XB LAD 3.5 guiding catheter.  Lesion #1: (mid Circumflex): I advanced a Cougar IC wire down the Circumflex. A 2.5 x 12 mm balloon was used to pre-dilate the severe stenosis in the mid Circumflex artery. I then carefully positioned and deployed a 3.5 x 20 mm Promus Premier DES in the mid Circumflex. The stent was post-dilated with a 3.75 x 15 mm Milam balloon x 1. The stenosis was taken from 99% down to 0%.  Lesion #2 (mid LAD): I then pulled the wire back and advanced down the LAD. A 2.5 x 12 mm balloon was used to pre-dilate the mid LAD stenosis. I then carefully positioned a 2.75 x 16 mm Promus Premier DES in the mid LAD, overlapping with the old stent on the distal edge of the old stent. The stent was post-dilated with a 3.0 x 12 mm [place] balloon x 1. The stenosis was taken from 80% down to 0%.  The sheath was removed from the right radial artery  and a Terumo hemostasis band was applied at the arteriotomy site on the right wrist. There were no immediate complications. The patient was taken to the recovery area in stable condition.   Hemodynamic Findings: Central aortic pressure: 99/68 Left ventricular pressure: 99/5/12 Angiographic Findings: Left main: No obstructive disease.  Left Anterior Descending Artery: Large caliber vessel that courses to the apex. There is mild plaque in the proximal vessel.The mid vessel has a patent stent without restenosis. Just beyond the stented segment in the mid LAD there is a focal 80% stenosis. This is seen in several views and is felt to be flow limiting.  Circumflex Artery: Large caliber vessel with small first obtuse marginal branch and large second obtuse marginal branch. The mid AV groove Circumflex has a 99% stenosis. The superior branch of OM2 has a focal 40% stenosis, unchanged from last cath.  Right Coronary Artery: Large dominant vessel with diffuse 50% proximal stenosis, unchanged from last cath.  Left Ventricular Angiogram: LVEF=60-65%.   EKG:  EKG is ordered today. The ekg ordered today demonstrates Sinus tachycardia, rate 102 bpm.    Recent Labs: No results found for requested labs within last 8760 hours.   Lipid Panel    Component Value Date/Time   CHOL 142 11/03/2015 0733   TRIG 116 11/03/2015 0733   TRIG 82 08/15/2006 0929   HDL 62 11/03/2015 0733   CHOLHDL 2.3 11/03/2015 0733   VLDL 23 11/03/2015 0733   LDLCALC 57 11/03/2015 0733     Wt Readings from Last 3 Encounters:  11/16/18 155 lb 9.6 oz (70.6 kg)  07/19/16 158 lb (71.7 kg)  10/05/15 162 lb 12.8 oz (73.8 kg)     Other studies Reviewed: Additional studies/ records that were reviewed today  include: . Review of the above records demonstrates:    Assessment and Plan:   1. CAD without angina: He has no chest pain. His dyspnea could be his anginal equivalent although I suspect his dyspnea is a combination of  underlying lung disease and volume overload. No recent assessment of LV systolic function. He had chest pain prior to last stenting procedure. He has been off of his statin and Plavix. He does not tolerate beta blockers due to fatigue. I am not sure that an ischemic evaluation is indicated but he will need assessment of LV function if he allows [place] to arrange this.   2. Tobacco abuse: I have asked him to stop smoking.    3. HTN: BP is borderline on no meds.    4. Hyperlipidemia: I had planned on checking lipids today but will delay for now given his lack of funds to pay for this. He has not been taking his statin for 18 months due to lack of money. Will not restart statin today  5. Acute diastolic CHF/Dyspnea: No recent assessment of LV systolic function. He likely has severe underlying lung disease with long standing tobacco abuse but he is clearly volume overloaded on exam today with JVD and profound dyspnea after lying flat for 5 seconds. I have asked him if he would be willing to let us admit him for diuresis and workup of his dyspnea to include an echo workup/treatment for COPD (chest x-ray, PFTs and breathing treatments). I think admitting him is the best way to figure out what is going on. He is refusing admission. I will try Lasix 40 mg po daily at home. He refuses an echo. He wants to know how much PFTs would cost out of pocket. He absolutely needs to be admitted but I can't push this any further as he has no insurance. I have asked him to go into the ED if he has worsening of symptoms.   Current medicines are reviewed at length with the patient today.  The patient does not have concerns regarding medicines.  The following changes have been made:  no change  Labs/ tests ordered today include:   Orders Placed This Encounter  Procedures  . Basic Metabolic Panel (BMET)  . Pro b natriuretic peptide  . EKG 12-Lead    Disposition:   FU with me in 1 months  Signed, Verne Carrow,  MD 11/16/2018 3:56 PM    [facility] [address] [place], [place], [place]  [address] Phone: [phone]; Fax: [phone]

## 2018-11-17 ENCOUNTER — Telehealth: Payer: Self-pay | Admitting: *Deleted

## 2018-11-17 LAB — BASIC METABOLIC PANEL
BUN/Creatinine Ratio: 14 (ref 10–24)
BUN: 13 mg/dL (ref 8–27)
CALCIUM: 9.6 mg/dL (ref 8.6–10.2)
CO2: 27 mmol/L (ref 20–29)
CREATININE: 0.96 mg/dL (ref 0.76–1.27)
Chloride: 96 mmol/L (ref 96–106)
GFR calc Af Amer: 98 mL/min/{1.73_m2} (ref >59)
GFR calc non Af Amer: 85 mL/min/{1.73_m2} (ref >59)
GLUCOSE: 93 mg/dL (ref 65–99)
Potassium: 5.5 mmol/L — ABNORMAL HIGH (ref 3.5–5.2)
Sodium: 140 mmol/L (ref 134–144)

## 2018-11-17 LAB — PRO B NATRIURETIC PEPTIDE: NT-PRO BNP: 75 pg/mL (ref 0–210)

## 2018-11-17 MED ORDER — FUROSEMIDE 40 MG PO TABS: ORAL_TABLET | ORAL | Status: DC

## 2018-11-17 NOTE — Telephone Encounter (Signed)
-----   Message from Kathleene Hazel, MD sent at 11/17/2018 10:39 AM EST ----- This patient was seen yesterday in my office and had really severe dyspnea. He was felt to be at least mildly volume overloaded. Also felt to have pulmonary cause of dyspnea, likely COPD. I started Lasix. Can we tell him that the BNP suggests he is not significantly volume overloaded. His kidneys are ok. I would continue Lasix 40 mg daily for one week and see if this helps with swelling. I asked him to go to the ED this week if his dyspnea worsened or f/u with the [facility] center for further workup of dyspnea. Can we let him know? Dennie Bible is off today. Thanks, chris

## 2018-11-17 NOTE — Telephone Encounter (Signed)
Pt was updated with information and verbalized understanding. Lasix was updated with instructions on usage.

## 2018-12-09 ENCOUNTER — Telehealth: Payer: Self-pay | Admitting: Cardiovascular Disease

## 2018-12-09 NOTE — Telephone Encounter (Signed)
Informed pt he would be called in the near future regarding upcoming appt if cancelation is necessary at that time

## 2018-12-09 NOTE — Telephone Encounter (Signed)
New Message   Pt is calling about his appointment on 12/23/18  He is wondering if it is safe to have this appointment  Please call and advise

## 2018-12-21 ENCOUNTER — Telehealth: Payer: Self-pay | Admitting: *Deleted

## 2018-12-21 NOTE — Telephone Encounter (Signed)
Pt agreeable to video visit with Dr. Clifton James.  He plans to discuss his SOB and swelling as well as requesting a handicap placard. Adv that if SOB worsens before appointment to call office to review.  TELEPHONE CALL NOTE  John [name] has been deemed a candidate for a follow-up tele-health visit to limit community exposure during the Covid-19 pandemic. I spoke with the patient via phone to ensure availability of phone/video source, confirm preferred email & phone number, and discuss instructions and expectations.  I reminded John [name] to be prepared with any vital sign and/or heart rhythm information that could potentially be obtained via home monitoring, at the time of his visit. I reminded John [name] to expect a phone call at the time of his visit if his visit.  Did the patient verbally acknowledge consent to treatment? YES/mw  John Ka, RN 12/21/2018 3:41 PM   DOWNLOADING THE WEBEX SOFTWARE TO SMARTPHONE  - If Apple, go to [employer] and type in WebEx in the search bar. Download [employer], the blue/green circle. The app is free but as with any other app downloads, their phone may require them to verify saved payment information or Apple password. The patient does NOT have to create an account.  - If Android, ask patient to go to [employer] and type in WebEx in the search bar. Download [employer], the blue/green circle. The app is free but as with any other app downloads, their phone may require them to verify saved payment information or Android password. The patient does NOT have to create an account.   CONSENT FOR TELE-HEALTH VISIT - PLEASE REVIEW  I hereby voluntarily request, consent and authorize [facility] and its employed or contracted physicians, physician assistants, nurse practitioners or other licensed health care professionals (the Practitioner), to provide me with telemedicine health care services (the  "Services") as deemed necessary by the treating Practitioner. I acknowledge and consent to receive the Services by the Practitioner via telemedicine. I understand that the telemedicine visit will involve communicating with the Practitioner through live audiovisual communication technology and the disclosure of certain medical information by electronic transmission. I acknowledge that I have been given the opportunity to request an in-person assessment or other available alternative prior to the telemedicine visit and am voluntarily participating in the telemedicine visit.  I understand that I have the right to withhold or withdraw my consent to the use of telemedicine in the course of my care at any time, without affecting my right to future care or treatment, and that the Practitioner or I may terminate the telemedicine visit at any time. I understand that I have the right to inspect all information obtained and/or recorded in the course of the telemedicine visit and may receive copies of available information for a reasonable fee.  I understand that some of the potential risks of receiving the Services via telemedicine include:  Marland Kitchen Delay or interruption in medical evaluation due to technological equipment failure or disruption; . Information transmitted may not be sufficient (e.g. poor resolution of images) to allow for appropriate medical decision making by the Practitioner; and/or  . In rare instances, security protocols could fail, causing a breach of personal health information.  Furthermore, I acknowledge that it is my responsibility to provide information about my medical history, conditions and care that is complete and accurate to the best of my ability. I acknowledge that Practitioner's advice, recommendations, and/or decision may be based on  factors not within their control, such as incomplete or inaccurate data provided by me or distortions of diagnostic images or specimens that may result from  electronic transmissions. I understand that the practice of medicine is not an exact science and that Practitioner makes no warranties or guarantees regarding treatment outcomes. I acknowledge that I will receive a copy of this consent concurrently upon execution via email to the email address I last provided but may also request a printed copy by calling the office of [facility].    I understand that my insurance will be billed for this visit.   I have read or had this consent read to me. . I understand the contents of this consent, which adequately explains the benefits and risks of the Services being provided via telemedicine.  . I have been provided ample opportunity to ask questions regarding this consent and the Services and have had my questions answered to my satisfaction. . I give my informed consent for the services to be provided through the use of telemedicine in my medical care  By participating in this telemedicine visit I agree to the above.

## 2018-12-23 ENCOUNTER — Other Ambulatory Visit: Payer: Self-pay

## 2018-12-23 ENCOUNTER — Encounter: Payer: Self-pay | Admitting: Cardiovascular Disease

## 2018-12-23 ENCOUNTER — Telehealth (INDEPENDENT_AMBULATORY_CARE_PROVIDER_SITE_OTHER): Payer: Self-pay | Admitting: Cardiovascular Disease

## 2018-12-23 ENCOUNTER — Encounter

## 2018-12-23 ENCOUNTER — Ambulatory Visit: Payer: Self-pay | Admitting: Cardiovascular Disease

## 2018-12-23 VITALS — BP 148/93 | HR 96 | Ht 71.0 in | Wt 155.0 lb

## 2018-12-23 DIAGNOSIS — I5032 Chronic diastolic (congestive) heart failure: Secondary | ICD-10-CM

## 2018-12-23 DIAGNOSIS — Z720 Tobacco use: Secondary | ICD-10-CM

## 2018-12-23 DIAGNOSIS — I2510 Atherosclerotic heart disease of native coronary artery without angina pectoris: Secondary | ICD-10-CM

## 2018-12-23 DIAGNOSIS — E7800 Pure hypercholesterolemia, unspecified: Secondary | ICD-10-CM

## 2018-12-23 DIAGNOSIS — I10 Essential (primary) hypertension: Secondary | ICD-10-CM

## 2018-12-23 MED ORDER — FUROSEMIDE 40 MG PO TABS: 40.0000 mg | ORAL_TABLET | Freq: Every day | ORAL | 3 refills | Status: DC

## 2018-12-23 NOTE — Patient Instructions (Signed)
Medication Instructions:  1.) continue lasix 40 mg once a day, everyday No other medication changes today. If you need a refill on your cardiac medications before your next appointment, please call your pharmacy.   Lab work: none If you have labs (blood work) drawn today and your tests are completely normal, you will receive your results only by: Marland Kitchen MyChart Message (if you have MyChart) OR . A paper copy in the mail If you have any lab test that is abnormal or we need to change your treatment, we will call you to review the results.  Testing/Procedures: none  Follow-Up: At [facility], you and your health needs are our priority.  As part of our continuing mission to provide you with exceptional heart care, we have created designated Provider Care Teams.  These Care Teams include your primary Cardiologist (physician) and Advanced Practice Providers (APPs -  Physician Assistants and Nurse Practitioners) who all work together to provide you with the care you need, when you need it. You will need a follow up appointment in 3 months.  Please call our office 2 months in advance to schedule this appointment.  You may see Verne Carrow, MD or one of the following Advanced Practice Providers on your designated Care Team:   [place], PA-C Ronie Spies, PA-C . Jacolyn Reedy, PA-C  Any Other Special Instructions Will Be Listed Below (If Applicable).

## 2018-12-23 NOTE — Progress Notes (Signed)
Virtual Visit via Video Note    Evaluation Performed:  Follow-up visit  This visit type was conducted due to national recommendations for restrictions regarding the COVID-19 Pandemic (e.g. social distancing).  This format is felt to be most appropriate for this patient at this time.  All issues noted in this document were discussed and addressed.  No physical exam was performed (except for noted visual exam findings with Video Visits).  Please refer to the patient's chart (MyChart message for video visits and phone note for telephone visits) for the patient's consent to telehealth for [facility].  Date:  12/23/2018   ID:  John [name], DOB 1956-03-30, MRN [id number]  Patient Location:  [name]  Provider location:   [facility] street office  PCP:  Patient, No Pcp Per  Cardiologist:  Verne Carrow, MD  Electrophysiologist:  None   Chief Complaint:   Follow up/CAD/dyspnea  History of Present Illness:    John [name] is a 63 y.o. male who presents via audio/video conferencing for a telehealth visit today.    63 yo male with history of ongoing tobacco abuse, CAD , HTN, hyperlipidemia and likely COPD who is being seen today via E-visit for cardiac follow up. This E-visit is being conducted during the Covid pandemic. He was admitted to [facility] December 2011 with a NSTEMI and a drug eluting stent was placed in the LAD. He  had 3 admissions in July 2013 with severe epistaxis causing hemorrhagic shock with tachycardia and drop in blood pressure. This was treated with packing and transfusion. His Plavix was stopped. Cardiac cath January 2016 in the setting of unstable angina with severe mid LAD stenosis treated with a drug eluting stent and severe mid Circumflex stenosis treated with a drug eluting stent. LV function was normal. I saw him in May 2016 and he c/o fatigue and dyspnea. His Coreg was stopped and he had resolution of his symptoms. He lost  his job in 2018 and did not return for follow up in our office over the next two years. I saw him in the office 11/16/18 and he c/o progressive dyspnea. He did not call our office sooner as he has no ability to pay his bills and has been denied assistance with government services/free healthcare as he owns his house (the value of which puts him over the poverty threshold). He has been off of all cardiac medications for over a year. He also described lower extremity edema for several weeks as well as orthopnea. He continues to smoke. When I saw him on 11/16/18 he was profoundly dyspneic with apparent volume overload. He refused admission for treatment and refused an echo. I started Lasix 40 mg daily. BNP was normal. BMET was normal. He did not wish to restart ASA or statin at the last visit.   He tells me today that he has been feeling better. No chest pain, dizziness or near syncope. His dyspnea is improved. Lower ext edema is improved on Lasix. Still smoking 1 ppd. Awaiting his visit in pulmonary to establish there.   The patient does not have symptoms concerning for COVID-19 infection (fever, chills, cough, or new shortness of breath).    Prior CV studies:   The following studies were reviewed today:  Lab results from last visit  Past Medical History:  Diagnosis Date   Alcohol abuse 12/27/2010   Allergic rhinitis, cause unspecified 12/27/2010   Anal warts 04/11/2012   CAD, NATIVE VESSEL  10/04/2010   COPD (chronic obstructive pulmonary disease) (HCC)    "a touch" (09/28/2013)   History of blood transfusion 03/2012   related to nose bleed   HYPERLIPIDEMIA-MIXED 10/04/2010   HYPERTENSION, BENIGN 10/04/2010   Myocardial infarction (HCC) 09/16/2010   Pneumonia    "when I was a kid"   TOBACCO ABUSE 10/04/2010   Past Surgical History:  Procedure Laterality Date   CARDIAC CATHETERIZATION  ~ 2012   CORONARY ANGIOPLASTY WITH STENT PLACEMENT  09/17/2010; 09/28/2014   "1; 2"   FINGER SURGERY  Left    "almost cut off" tip of 2nd digit   LEFT HEART CATHETERIZATION WITH CORONARY ANGIOGRAM N/A 09/28/2014   Procedure: LEFT HEART CATHETERIZATION WITH CORONARY ANGIOGRAM;  Surgeon: Kathleene Hazel, MD;  Location: [facility] CATH LAB;  Service: Cardiovascular;  Laterality: N/A;   NASAL ENDOSCOPY WITH EPISTAXIS CONTROL Bilateral 03/2012   VASECTOMY       Current Meds  Medication Sig   Multiple Vitamin (MULTIVITAMIN WITH MINERALS) TABS tablet Take 1 tablet by mouth daily.   nitroGLYCERIN (NITROSTAT) 0.4 MG SL tablet Place 1 tablet (0.4 mg total) under the tongue every 5 (five) minutes as needed for chest pain.   [DISCONTINUED] furosemide (LASIX) 40 MG tablet Take one tablet (40 mg) by mouth for one week only     Allergies:   Penicillins   Social History   Tobacco Use   Smoking status: Current Every Day Smoker    Packs/day: 2.00    Years: 45.00    Pack years: 90.00    Types: Cigarettes   Smokeless tobacco: Never Used   Tobacco comment: 2 plus packs per day. He has a 100 + pack - year history of tobacco abuse currently. Former 4 ppd for 25 years.  Substance Use Topics   Alcohol use: Yes    Alcohol/week: 144.0 standard drinks    Types: 144 Cans of beer per week    Comment: 09/28/2014 "12 pack of beer per night"   Drug use: No     Family Hx: The patient's family history includes Brain cancer in his mother; Heart disease in his father.  ROS:   Please see the history of present illness.    All other systems reviewed and are negative.   Labs/Other Tests and Data Reviewed:    Recent Labs: 11/16/2018: BUN 13; Creatinine, Ser 0.96; NT-Pro BNP 75; Potassium 5.5; Sodium 140   Recent Lipid Panel Lab Results  Component Value Date/Time   CHOL 142 11/03/2015 07:33 AM   TRIG 116 11/03/2015 07:33 AM   TRIG 82 08/15/2006 09:29 AM   HDL 62 11/03/2015 07:33 AM   CHOLHDL 2.3 11/03/2015 07:33 AM   LDLCALC 57 11/03/2015 07:33 AM    Wt Readings from Last 3 Encounters:    12/23/18 155 lb (70.3 kg)  11/16/18 155 lb 9.6 oz (70.6 kg)  07/19/16 158 lb (71.7 kg)     Objective:    Vital Signs:  BP (!) 148/93    Pulse 96    Ht  (1.803 m)    Wt 155 lb (70.3 kg)    BMI 21.62 kg/m    Well nourished, well developed male  Appears comfortable  Cardiac cath January 2016: PCI Note: He was given an additional 6000 units of IV heparin. He was given Plavix 600 mg po x 1. When the ACT was over 200, I engaged the left main with a XB LAD 3.5 guiding catheter.  Lesion #1: (mid Circumflex): I advanced a  Cougar IC wire down the Circumflex. A 2.5 x 12 mm balloon was used to pre-dilate the severe stenosis in the mid Circumflex artery. I then carefully positioned and deployed a 3.5 x 20 mm Promus Premier DES in the mid Circumflex. The stent was post-dilated with a 3.75 x 15 mm Cresbard balloon x 1. The stenosis was taken from 99% down to 0%.  Lesion #2 (mid LAD): I then pulled the wire back and advanced down the LAD. A 2.5 x 12 mm balloon was used to pre-dilate the mid LAD stenosis. I then carefully positioned a 2.75 x 16 mm Promus Premier DES in the mid LAD, overlapping with the old stent on the distal edge of the old stent. The stent was post-dilated with a 3.0 x 12 mm Summers balloon x 1. The stenosis was taken from 80% down to 0%.  The sheath was removed from the right radial artery and a Terumo hemostasis band was applied at the arteriotomy site on the right wrist. There were no immediate complications. The patient was taken to the recovery area in stable condition.   Hemodynamic Findings: Central aortic pressure: 99/68 Left ventricular pressure: 99/5/12 Angiographic Findings: Left main: No obstructive disease.  Left Anterior Descending Artery: Large caliber vessel that courses to the apex. There is mild plaque in the proximal vessel.The mid vessel has a patent stent without restenosis. Just beyond the stented segment in the mid LAD there is a focal 80% stenosis. This is seen in  several views and is felt to be flow limiting.  Circumflex Artery: Large caliber vessel with small first obtuse marginal branch and large second obtuse marginal branch. The mid AV groove Circumflex has a 99% stenosis. The superior branch of OM2 has a focal 40% stenosis, unchanged from last cath.  Right Coronary Artery: Large dominant vessel with diffuse 50% proximal stenosis, unchanged from last cath.  Left Ventricular Angiogram: LVEF=60-65%.    ASSESSMENT & PLAN:    1. CAD without angina: He has no chest pain. His dyspnea could be his anginal equivalent although I suspect his dyspnea is a combination of underlying lung disease and mild volume overload. No recent assessment of LV systolic function. He had chest pain prior to last stenting procedure. He has been off of his statin and Plavix. He does not tolerate beta blockers due to fatigue. I am not sure that an ischemic evaluation is indicated but he will need assessment of LV function if he allows [place] to arrange this. He does not wish to plan an echo at this time as he has no insurance.   2. Tobacco abuse: I have asked him to stop smoking. He will try to cut back   3. HTN: BP is borderline at home on no meds. He will follow closely over the next week and send results to me in MyChart  4. Hyperlipidemia: He does not wish to start a statin at this time. We discussed the importance of statins today.   5. Chronic diastolic CHF/Dyspnea: He continues to have dyspnea but much improved with diuresis. Pulmonary referral pending. He has no recent assessment of LV systolic function. He likely has severe underlying lung disease with long standing tobacco abuse. For now will continue Lasix 40 mg daily. No other changes.    COVID-19 Education: The signs and symptoms of COVID-19 were discussed with the patient and how to seek care for testing (follow up with PCP or arrange E-visit).  The importance of social distancing was discussed today.  Patient Risk:     After full review of this patient's clinical status, I feel that they are at least moderate risk at this time.  Time:   Today, I have spent 20 minutes with the patient with telehealth technology discussing his dypspnea and lab results as welll as the plan.     Medication Adjustments/Labs and Tests Ordered: Current medicines are reviewed at length with the patient today.  Concerns regarding medicines are outlined above.  Tests Ordered: No orders of the defined types were placed in this encounter.  Medication Changes: Meds ordered this encounter  Medications   furosemide (LASIX) 40 MG tablet    Sig: Take 1 tablet (40 mg total) by mouth daily.    Dispense:  90 tablet    Refill:  3    Disposition:  Follow up in 3 month(s)  Signed, Verne Carrow, MD  12/23/2018 11:11 AM     [facility]

## 2018-12-25 ENCOUNTER — Other Ambulatory Visit: Payer: Self-pay

## 2018-12-25 ENCOUNTER — Encounter: Payer: Self-pay | Admitting: Internal Medicine

## 2018-12-25 ENCOUNTER — Ambulatory Visit (INDEPENDENT_AMBULATORY_CARE_PROVIDER_SITE_OTHER): Payer: Self-pay | Admitting: Internal Medicine

## 2018-12-25 VITALS — BP 122/70 | HR 88 | Ht 70.0 in | Wt 151.4 lb

## 2018-12-25 DIAGNOSIS — R0902 Hypoxemia: Secondary | ICD-10-CM

## 2018-12-25 DIAGNOSIS — F17210 Nicotine dependence, cigarettes, uncomplicated: Secondary | ICD-10-CM

## 2018-12-25 DIAGNOSIS — J449 Chronic obstructive pulmonary disease, unspecified: Secondary | ICD-10-CM

## 2018-12-25 MED ORDER — GLYCOPYRROLATE-FORMOTEROL 9-4.8 MCG/ACT IN AERO: 2.0000 | INHALATION_SPRAY | Freq: Two times a day (BID) | RESPIRATORY_TRACT | 0 refills | Status: DC

## 2018-12-25 NOTE — Patient Instructions (Signed)
Bevespi Take 2 puffs first thing in am and then another 2 puffs about 12 hours later.     Work on inhaler technique:  relax and gently blow all the way out then take a nice smooth deep breath back in, triggering the inhaler at same time you start breathing in.  Rinse and gargle with water when done.  Pace yourself with walking slow than you did for [place] today to prevent low 02   Call if you feel the Eyvonne Left is helping you and we can get you a free supply thru [facility]   The key is to stop smoking completely before smoking completely stops you!  For smoking cessation classes call [phone]   I will review your previous work up from 2008 when available and call with additional recs if I find anything we didn't cover then.  Follow up thru this office can be as needed

## 2018-12-25 NOTE — Progress Notes (Signed)
John [name], male    DOB: 04-09-1956,     MRN: [id number]   Brief patient profile:  20 yowm active smoker worked at [employer] before then closed, exposed to brake shoes  late80s and progressive doe x 2008 when eval in pulmonary clinic by me (records requested) but never on maint inhalers or 02 so referred to pulmonary clinic 12/25/2018 by Dr   [facility] dept.    Has IHD s/p multiple stents/ follow by [facility]     History of Present Illness  12/25/2018  Pulmonary/ 1st office eval/Breleigh Carpino  Chief Complaint  Patient presents with  . pulm consult    Pt has increase SOB for last months  Dyspnea:  60 ft from curb to door / slow pace = MMRC3 = can't walk 100 yards even at a slow pace at a flat grade s stopping due to sob   Cough: none Sleep: insomnia but able to lie flat  SABA use: never    No obvious day to day or daytime variability or assoc excess/ purulent sputum or mucus plugs or hemoptysis or cp or chest tightness, subjective wheeze or overt sinus or hb symptoms.   Sleeping  without nocturnal  or early am exacerbation  of respiratory  c/o's or need for noct saba. Also denies any obvious fluctuation of symptoms with weather or environmental changes or other aggravating or alleviating factors except as outlined above   No unusual exposure hx or h/o childhood pna/ asthma or knowledge of premature birth.  Current Allergies, Complete Past Medical History, Past Surgical History, Family History, and Social History were reviewed in [employer] record.  ROS  The following are not active complaints unless bolded Hoarseness, sore throat, dysphagia, dental problems, itching, sneezing,  nasal congestion or discharge of excess mucus or purulent secretions, ear ache,   fever, chills, sweats, unintended wt loss or wt gain, classically pleuritic or exertional cp,  orthopnea pnd or arm/hand swelling  or leg swelling, presyncope, palpitations, abdominal pain,  anorexia, nausea, vomiting, diarrhea  or change in bowel habits or change in bladder habits, change in stools or change in urine, dysuria, hematuria,  rash, arthralgias, visual complaints, headache, numbness, weakness or ataxia or problems with walking or coordination,  change in mood or  memory.          .  Past Medical History:  Diagnosis Date  . Alcohol abuse 12/27/2010  . Allergic rhinitis, cause unspecified 12/27/2010  . Anal warts 04/11/2012  . CAD, NATIVE VESSEL 10/04/2010  . COPD (chronic obstructive pulmonary disease) (HCC)    "a touch" (09/28/2013)  . History of blood transfusion 03/2012   related to nose bleed  . HYPERLIPIDEMIA-MIXED 10/04/2010  . HYPERTENSION, BENIGN 10/04/2010  . Myocardial infarction (HCC) 09/16/2010  . Pneumonia    "when I was a kid"  . TOBACCO ABUSE 10/04/2010      Objective:     BP 122/70 (BP Location: Left Arm, Cuff Size: Normal)   Pulse 88   Ht 5\' 10"  (1.778 m)   Wt 151 lb 6.4 oz (68.7 kg)   SpO2 94%   BMI 21.72 kg/m   SpO2: 94 %  RA    Thin amb wm nad > stated age     HEENT: nl dentition / oropharynx. Nl external ear canals without cough reflex -  Mild bilateral non-specific turbinate edema     NECK :  without JVD/Nodes/TM/ nl carotid upstrokes bilaterally   LUNGS: no acc muscle  use,  Mod barrel  contour chest wall with bilateral  Distant bs s audible wheeze and  without cough on insp or exp maneuver and mod  Hyperresonant  to  percussion bilaterally     CV:  RRR  no s3 or murmur or increase in P2, and 1+ pitting both LE's sym  ABD:  soft and nontender with pos mid insp Hoover's  in the supine position. No bruits or organomegaly appreciated, bowel sounds nl  MS:   Nl gait/  ext warm without deformities, calf tenderness, cyanosis or clubbing No obvious joint restrictions   SKIN: warm and dry without lesions    NEURO:  alert, approp, nl sensorium with  no motor or cerebellar deficits apparent.          I personally reviewed  images and agree with radiology impression as follows:  CXR:   03/18/18 1.  Coronary stents noted.  Heart size normal. 2. No focal pulmonary infiltrate. Tiny bilateral pleural effusions cannot be excluded.       Assessment   COPD  Group B no pfts on file  Active smoking  - 12/25/2018  After extensive coaching inhaler device,  effectiveness =    75% so try BEVESPI 2bid and if helps then [place]funds   Pt is Group B in terms of symptom/risk and laba/lama therefore appropriate rx at this point >>>  Reasonable to try bevespi samples on hand first and f/u with full pfts  when COVID - 19 restrictions have been lifted.       Exercise hypoxemia 12/25/2018   Walked RA 3 laps @  approx 224ft each @ fast pace  stopped due to end of study but sob and desats starting on lap 2 > declined ex 02     Present guidelines for stable copd do not require ex 02 and pt declines to wear it anyway so advised better pacing to prevent desaturations from affecting cardiac function and precipitating ischemia.     Cigarette smoker Counseled re importance of smoking cessation but did not meet time criteria for separate billing    I emphasized that although we never turn away smokers from the pulmonary clinic, we do ask that they understand that the recommendations that we make  won't work nearly as well in the presence of continued cigarette exposure (eg like throwing a rag over an air intake on an engine) .   Also pointed out total expense of cigs is not just the direct cost but the healthcare expenses as well         Total time devoted to counseling  > 50 % of initial 60 min office visit:  review case with pt/See device teaching which extended face to face time for this visit as did observing portions of amb 02 sat study/ discussion of options/alternatives/ personally creating written customized instructions  in presence of pt  then going over those specific  Instructions  directly with the pt including how to use all of the meds but in particular covering each new medication in detail and the difference between the maintenance= "automatic" meds and the prns using an action plan format for the latter (If this problem/symptom => do that organization reading Left to right).  Please see AVS from this visit for a full list of these instructions which I personally wrote for this pt and  are unique to this visit.      Sandrea Hughs, MD 12/25/2018

## 2018-12-26 ENCOUNTER — Encounter: Payer: Self-pay | Admitting: Internal Medicine

## 2018-12-26 DIAGNOSIS — R0902 Hypoxemia: Secondary | ICD-10-CM | POA: Insufficient documentation

## 2018-12-26 DIAGNOSIS — J449 Chronic obstructive pulmonary disease, unspecified: Secondary | ICD-10-CM | POA: Insufficient documentation

## 2018-12-26 NOTE — Assessment & Plan Note (Signed)
Counseled re importance of smoking cessation but did not meet time criteria for separate billing    I emphasized that although we never turn away smokers from the pulmonary clinic, we do ask that they understand that the recommendations that we make  won't work nearly as well in the presence of continued cigarette exposure (eg like throwing a rag over an air intake on an engine) .   Also pointed out total expense of cigs is not just the direct cost but the healthcare expenses as well     Total time devoted to counseling  > 50 % of initial 60 min office visit:  review case with pt/See device teaching which extended face to face time for this visit as did observing portions of amb 02 sat study/ discussion of options/alternatives/ personally creating written customized instructions  in presence of pt  then going over those specific  Instructions directly with the pt including how to use all of the meds but in particular covering each new medication in detail and the difference between the maintenance= "automatic" meds and the prns using an action plan format for the latter (If this problem/symptom => do that organization reading Left to right).  Please see AVS from this visit for a full list of these instructions which I personally wrote for this pt and  are unique to this visit.

## 2018-12-26 NOTE — Assessment & Plan Note (Signed)
12/25/2018   Walked RA 3 laps @  approx 224ft each @ fast pace  stopped due to end of study but sob and desats starting on lap 2 > declined ex 02     Present guidelines for stable copd do not require ex 02 and pt declines to wear it anyway so advised better pacing to prevent desaturations from affecting cardiac function and precipitating ischemia.

## 2018-12-26 NOTE — Assessment & Plan Note (Signed)
Active smoking  - 12/25/2018  After extensive coaching inhaler device,  effectiveness =    75% so try BEVESPI 2bid and if helps then [place]/funds    Pt is Group B in terms of symptom/risk and laba/lama therefore appropriate rx at this point >>>  Reasonable to try bevespi samples on hand first and f/u with full pfts  when COVID - 19 restrictions have been lifted.

## 2019-01-07 ENCOUNTER — Telehealth: Payer: Self-pay | Admitting: Internal Medicine

## 2019-01-07 NOTE — Telephone Encounter (Signed)
Patient Instructions by Nyoka Cowden, MD at 12/25/2018 3:00 PM  Author: Nyoka Cowden, MD Author Type: Physician Filed: 12/25/2018 3:40 PM  Note Status: Signed Cosign: Cosign Not Required Encounter Date: 12/25/2018  Editor: Nyoka Cowden, MD (Physician)    Eyvonne Left Take 2 puffs first thing in am and then another 2 puffs about 12 hours later.     Work on inhaler technique:  relax and gently blow all the way out then take a nice smooth deep breath back in, triggering the inhaler at same time you start breathing in.  Rinse and gargle with water when done.  Pace yourself with walking slow than you did for [place] today to prevent low 02   Call if you feel the Eyvonne Left is helping you and we can get you a free supply thru [place]      Called and spoke with pt who stated the Eyvonne Left has really helped him and he wants to know what he can do to be able to get help with medication since he is not working. Stated to him that MW said if the [place] had helped him, we should be able to get him a free supply through [place].  Dr. Sherene Sires, please advise if you were meaning for pt to fill out the patient assistance application with [place] for the [facility] or if you had something that you could give pt in regards to this. Thanks!

## 2019-01-08 MED ORDER — GLYCOPYRROLATE-FORMOTEROL 9-4.8 MCG/ACT IN AERO: 2.0000 | INHALATION_SPRAY | Freq: Two times a day (BID) | RESPIRATORY_TRACT | 0 refills | Status: DC

## 2019-01-08 MED ORDER — GLYCOPYRROLATE-FORMOTEROL 9-4.8 MCG/ACT IN AERO: 2.0000 | INHALATION_SPRAY | Freq: Two times a day (BID) | RESPIRATORY_TRACT | 12 refills | Status: DC

## 2019-01-08 NOTE — Telephone Encounter (Signed)
I have obtained [place] for [place] and have filled out the provider section. I have also obtained samples of Bevespi for pt. Called and spoke with pt stating to him that the application as well as samples of Bevespi will be in [place] up front for him to come pick up. Pt expressed understanding. Nothing further needed.

## 2019-01-08 NOTE — Telephone Encounter (Signed)
Needs paperwork for [place] and send 2 samples while waiting if we have them

## 2019-01-21 ENCOUNTER — Telehealth: Payer: Self-pay | Admitting: Internal Medicine

## 2019-01-21 NOTE — Telephone Encounter (Signed)
OV notes from pt's visit with MW on 4/3 has been faxed to [facility] at provided fax number. Nothing further needed.

## 2019-03-11 ENCOUNTER — Telehealth: Payer: Self-pay | Admitting: Internal Medicine

## 2019-03-11 MED ORDER — BEVESPI AEROSPHERE 9-4.8 MCG/ACT IN AERO: 2.0000 | INHALATION_SPRAY | Freq: Two times a day (BID) | RESPIRATORY_TRACT | 12 refills | Status: DC

## 2019-03-11 NOTE — Telephone Encounter (Signed)
Rx for Bevespi sent to pt's preferred pharmacy. Called and spoke with pt letting him know this had been done. While speaking with pt, pt wanted to know if I could tell him when he should be receiving inhalers from the patient assistance program as he has received only one inhaler. Stated to him to contact the [place] program to discuss this with them. I provided pt with the phone number. Nothing further needed.

## 2019-03-12 ENCOUNTER — Telehealth: Payer: Self-pay | Admitting: Internal Medicine

## 2019-03-12 MED ORDER — BEVESPI AEROSPHERE 9-4.8 MCG/ACT IN AERO: 2.0000 | INHALATION_SPRAY | Freq: Two times a day (BID) | RESPIRATORY_TRACT | 0 refills | Status: DC

## 2019-03-12 NOTE — Telephone Encounter (Signed)
Patient came to the office stating that his Patient Assistance for his Charolotte Eke was "messed up" (their mistake) and he will not receive his next medication shipment for another couple of weeks.  Patient requesting samples.  4 samples obtained for patient and documented per protocol.  Nothing further needed; will sign off.

## 2019-04-15 ENCOUNTER — Encounter: Payer: Self-pay | Admitting: Cardiovascular Disease

## 2019-04-15 ENCOUNTER — Ambulatory Visit (INDEPENDENT_AMBULATORY_CARE_PROVIDER_SITE_OTHER): Payer: Self-pay | Admitting: Cardiovascular Disease

## 2019-04-15 ENCOUNTER — Other Ambulatory Visit: Payer: Self-pay

## 2019-04-15 ENCOUNTER — Telehealth: Payer: Self-pay | Admitting: Cardiovascular Disease

## 2019-04-15 VITALS — BP 136/84 | HR 95 | Ht 70.0 in | Wt 145.0 lb

## 2019-04-15 DIAGNOSIS — I2510 Atherosclerotic heart disease of native coronary artery without angina pectoris: Secondary | ICD-10-CM

## 2019-04-15 DIAGNOSIS — I10 Essential (primary) hypertension: Secondary | ICD-10-CM

## 2019-04-15 DIAGNOSIS — E7800 Pure hypercholesterolemia, unspecified: Secondary | ICD-10-CM

## 2019-04-15 DIAGNOSIS — I5032 Chronic diastolic (congestive) heart failure: Secondary | ICD-10-CM

## 2019-04-15 MED ORDER — ATORVASTATIN CALCIUM 40 MG PO TABS: 40.0000 mg | ORAL_TABLET | Freq: Every day | ORAL | 3 refills | Status: DC

## 2019-04-15 NOTE — Patient Instructions (Signed)
Medication Instructions:  1) START Atorvastatin 40mg  once daily  If you need a refill on your cardiac medications before your next appointment, please call your pharmacy.   Lab work: Your physician recommends that you return for lab work in: 3 months (Lipid, Liver, BMET).  Nothing to eat or drink after midnight except water and black coffee.  If you have labs (blood work) drawn today and your tests are completely normal, you will receive your results only by: Marland Kitchen MyChart Message (if you have MyChart) OR . A paper copy in the mail If you have any lab test that is abnormal or we need to change your treatment, we will call you to review the results.  Testing/Procedures: None  Follow-Up: At [facility], you and your health needs are our priority.  As part of our continuing mission to provide you with exceptional heart care, we have created designated Provider Care Teams.  These Care Teams include your primary Cardiologist (physician) and Advanced Practice Providers (APPs -  Physician Assistants and Nurse Practitioners) who all work together to provide you with the care you need, when you need it. You will need a follow up appointment in 6 months.  Please call our office 2 months in advance to schedule this appointment.  You may see Lauree Chandler, MD or one of the following Advanced Practice Providers on your designated Care Team:   Lerna, PA-C Melina Copa, PA-C . Ermalinda Barrios, PA-C  Any Other Special Instructions Will Be Listed Below (If Applicable).

## 2019-04-15 NOTE — Telephone Encounter (Signed)
New Message ° °  ° ° °  ° ° °COVID-19 Pre-Screening Questions: ° °• In the past 7 to 10 days have you had a cough,  shortness of breath, headache, congestion, fever (100 or greater) body aches, chills, sore throat, or sudden loss of taste or sense of smell? °Smokers cough °• Have you been around anyone with known Covid 19. °NO °• Have you been around anyone who is awaiting Covid 19 test results in the past 7 to 10 days? °NO °• Have you been around anyone who has been exposed to Covid 19, or has mentioned symptoms of Covid 19 within the past 7 to 10 days? °NO ° °If you have any concerns/questions about symptoms patients report during screening (either on the phone or at threshold). Contact the provider seeing the patient or DOD for further guidance.  If neither are available contact a member of the leadership team. ° ° ° °   ° ° ° ° ° °

## 2019-04-15 NOTE — Progress Notes (Signed)
Chief Complaint  Patient presents with   Follow-up    CAD   History of Present Illness: 63 yo male with history of ongoing tobacco abuse, CAD , HTN, hyperlipidemia and likely COPD who is here today for cardiac follow up. He was [facility] with a NSTEMI. Cardiac cath 09/17/10 and was found to have a severe stenosis of the LAD which was treated with a drug eluting stent. He was also found to have moderate disease in the RCA (70%) and Circumflex (70%) managed medically. Cardiac cath June 2012 with stable CAD. He  had 3 admissions in July 2013 with severe epistaxis causing hemorrhagic shock with tachycardia and drop in blood pressure. This was treated with packing and transfusion. His Plavix was stopped. He was admitted January 2016 with unstable angina. Cardiac cath January 2016 with severe mid LAD stenosis, severe mid Circumflex stenosis. I placed a drug eluting stent in the mid LAD and a drug eluting stent in the mid Circumflex. LV function normal. I saw him in May 2016 and he c/o fatigue and dyspnea. His Coreg was stopped and he had resolution of his symptoms. He missed follow up appointments in 2017-2020. I saw him February 2020 with c/o dyspnea and lower extremity edema. He had been off of all medications due to losing his job and having no money. He continued to smoke at least one pack per day and was drinking up to a 6 pack of beer per day. He described dyspnea with minimal exertion, even talking and could not lay flat. [place] [facility] [place] and workup has been underway for dyspnea. Mention of PFTs but no way to pay for it. I felt that his dyspnea was a combination of underlying lung disease and volume overload. He refused admission or an echo. BNP and BMET were normal. I started Lasix 40 mg daily. I saw him by virtual visit 12/23/18 and he reported great improvement in his LE edema and dyspnea. He has re-established in the  Pulmonary clinic with Dr. Sherene SiresWert and is now on an inhaler.    He is here today for follow up. The patient denies any chest pain, palpitations, lower extremity edema, orthopnea, PND, dizziness, near syncope or syncope. His breathing is much better. He feels great. Still smoking 1 ppd.    Primary Care Physician: Patient, No Pcp Per   Past Medical History:  Diagnosis Date   Alcohol abuse 12/27/2010   Allergic rhinitis, cause unspecified 12/27/2010   Anal warts 04/11/2012   CAD, NATIVE VESSEL 10/04/2010   COPD (chronic obstructive pulmonary disease) (HCC)    "a touch" (09/28/2013)   History of blood transfusion 03/2012   related to nose bleed   HYPERLIPIDEMIA-MIXED 10/04/2010   HYPERTENSION, BENIGN 10/04/2010   Myocardial infarction (HCC) 09/16/2010   Pneumonia    "when I was a kid"   TOBACCO ABUSE 10/04/2010    Past Surgical History:  Procedure Laterality Date   CARDIAC CATHETERIZATION  ~ 2012   CORONARY ANGIOPLASTY WITH STENT PLACEMENT  09/17/2010; 09/28/2014   "1; 2"   FINGER SURGERY Left    "almost cut off" tip of 2nd digit   LEFT HEART CATHETERIZATION WITH CORONARY ANGIOGRAM N/A 09/28/2014   Procedure: LEFT HEART CATHETERIZATION WITH CORONARY ANGIOGRAM;  Surgeon: Kathleene Hazelhristopher D Julia Alkhatib, MD;  Location[facility] CATH LAB;  Service: Cardiovascular;  Laterality: N/A;   NASAL ENDOSCOPY WITH EPISTAXIS CONTROL Bilateral 03/2012   VASECTOMY  Current Outpatient Medications  Medication Sig Dispense Refill   aspirin EC 81 MG tablet Take 81 mg by mouth daily.     furosemide (LASIX) 40 MG tablet Take 1 tablet (40 mg total) by mouth daily. 90 tablet 3   Glycopyrrolate-Formoterol ([name]) 9-4.8 MCG/ACT AERO Inhale 2 puffs into the lungs 2 (two) times daily. 23.6 g 0   Multiple Vitamin (MULTIVITAMIN WITH MINERALS) TABS tablet Take 1 tablet by mouth daily.     nitroGLYCERIN (NITROSTAT) 0.4 MG SL tablet Place 1 tablet (0.4 mg total) under the tongue every 5 (five) minutes as  needed for chest pain. 25 tablet 6   atorvastatin (LIPITOR) 40 MG tablet Take 1 tablet (40 mg total) by mouth daily. 90 tablet 3   No current facility-administered medications for this visit.     Allergies  Allergen Reactions   Penicillins Other (See Comments)    Passes out    Social History   Socioeconomic History   Marital status: Single    Spouse name: Not on file   Number of children: Not on file   Years of education: Not on file   Highest education level: Not on file  Occupational History   Occupation: Lobbyist maintenance work     Employer: [employer]    Comment: Ronette Deter  Social Needs   Financial resource strain: Not on file   Food insecurity    Worry: Not on file    Inability: Not on file   Transportation needs    Medical: Not on file    Non-medical: Not on file  Tobacco Use   Smoking status: Current Every Day Smoker    Packs/day: 2.00    Years: 49.00    Pack years: 98.00    Types: Cigarettes   Smokeless tobacco: Never Used   Tobacco comment: 2 plus packs per day. He has a 100 + pack - year history of tobacco abuse currently. Former 4 ppd for 25 years.  Substance and Sexual Activity   Alcohol use: Yes    Alcohol/week: 144.0 standard drinks    Types: 144 Cans of beer per week    Comment: 09/28/2014 "12 pack of beer per night"   Drug use: No   Sexual activity: Yes    Birth control/protection: None  Lifestyle   Physical activity    Days per week: Not on file    Minutes per session: Not on file   Stress: Not on file  Relationships   Social connections    Talks on phone: Not on file    Gets together: Not on file    Attends religious service: Not on file    Active member of club or organization: Not on file    Attends meetings of clubs or organizations: Not on file    Relationship status: Not on file   Intimate partner violence    Fear of current or ex partner: Not on file    Emotionally abused: Not on file    Physically abused:  Not on file    Forced sexual activity: Not on file  Other Topics Concern   Not on file  Social History Narrative   The patient lives in [place] with his girlfriend. 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    Family History  Problem Relation Age of Onset   Brain cancer Mother    Heart disease Father     Review of Systems:  As stated in  the HPI and otherwise negative.   BP 136/84    Pulse 95    Ht 5\' 10"  (1.778 m)    Wt 145 lb (65.8 kg)    SpO2 96%    BMI 20.81 kg/m   Physical Examination:  General: Well developed, well nourished, NAD  HEENT: OP clear, mucus membranes moist  SKIN: warm, dry. No rashes. Neuro: No focal deficits  Musculoskeletal: Muscle strength 5/5 all ext  Psychiatric: Mood and affect normal  Neck: No JVD, no carotid bruits, no thyromegaly, no lymphadenopathy.  Lungs:Clear bilaterally, no wheezes, rhonci, crackles Cardiovascular: Regular rate and rhythm. No murmurs, gallops or rubs. Abdomen:Soft. Bowel sounds present. Non-tender.  Extremities: No lower extremity edema. Pulses are 2 + in the bilateral DP/PT.  Cardiac cath January 2016: PCI Note: He was given an additional 6000 units of IV heparin. He was given Plavix 600 mg po x 1. When the ACT was over 200, I engaged the left main with a XB LAD 3.5 guiding catheter.  Lesion #1: (mid Circumflex): I advanced a Cougar IC wire down the Circumflex. A 2.5 x 12 mm balloon was used to pre-dilate the severe stenosis in the mid Circumflex artery. I then carefully positioned and deployed a 3.5 x 20 mm Promus Premier DES in the mid Circumflex. The stent was post-dilated with a 3.75 x 15 mm [place] balloon x 1. The stenosis was taken from 99% down to 0%.  Lesion #2 (mid LAD): I then pulled the wire back and advanced down the LAD. A 2.5 x 12 mm balloon was used to pre-dilate the mid LAD stenosis. I then carefully positioned a 2.75 x 16 mm Promus Premier DES in the mid LAD, overlapping with the old  stent on the distal edge of the old stent. The stent was post-dilated with a 3.0 x 12 mm [place] balloon x 1. The stenosis was taken from 80% down to 0%.  The sheath was removed from the right radial artery and a Terumo hemostasis band was applied at the arteriotomy site on the right wrist. There were no immediate complications. The patient was taken to the recovery area in stable condition.   Hemodynamic Findings: Central aortic pressure: 99/68 Left ventricular pressure: 99/5/12 Angiographic Findings: Left main: No obstructive disease.  Left Anterior Descending Artery: Large caliber vessel that courses to the apex. There is mild plaque in the proximal vessel.The mid vessel has a patent stent without restenosis. Just beyond the stented segment in the mid LAD there is a focal 80% stenosis. This is seen in several views and is felt to be flow limiting.  Circumflex Artery: Large caliber vessel with small first obtuse marginal branch and large second obtuse marginal branch. The mid AV groove Circumflex has a 99% stenosis. The superior branch of OM2 has a focal 40% stenosis, unchanged from last cath.  Right Coronary Artery: Large dominant vessel with diffuse 50% proximal stenosis, unchanged from last cath.  Left Ventricular Angiogram: LVEF=60-65%.   EKG:  EKG is not ordered today. The ekg ordered today demonstrates   Recent Labs: 11/16/2018: BUN 13; Creatinine, Ser 0.96; NT-Pro BNP 75; Potassium 5.5; Sodium 140   Lipid Panel    Component Value Date/Time   CHOL 142 11/03/2015 0733   TRIG 116 11/03/2015 0733   TRIG 82 08/15/2006 0929   HDL 62 11/03/2015 0733   CHOLHDL 2.3 11/03/2015 0733   VLDL 23 11/03/2015 0733   LDLCALC 57 11/03/2015 0733     Wt Readings from Last 3  Encounters:  04/15/19 145 lb (65.8 kg)  12/25/18 151 lb 6.4 oz (68.7 kg)  12/23/18 155 lb (70.3 kg)     Other studies Reviewed: Additional studies/ records that were reviewed today include: . Review of the above records  demonstrates:    Assessment and Plan:   1. CAD without angina: No chest pain. Dyspnea much improved after diuresis and after starting an inhaler for his COPD. He has been off of the ASA and statin due to cost. He does not tolerate beta blockers due to fatigue. No recent assessment of LV systolic function. He has refused an echo. He had chest pain prior to last stenting procedure but has had no chest pain lately. Continue ASA. Restart Lipitor 40 mg daily.    2. Tobacco abuse: He is asked to stop smoking.   3. HTN: BP is controlled.   4. Hyperlipidemia: I had planned on checking lipids at the last visit but he could not afford the cost of the labs. Will restart Lipitor 40 mg daily now and check CMET and lipids in 12 weeks.   5. Chronic diastolic CHF: No recent assessment of LV systolic function. He has refused an echo. LE edema is much better. Weight is stable. Dyspnea improved. Continue Lasix.   Current medicines are reviewed at length with the patient today.  The patient does not have concerns regarding medicines.  The following changes have been made:  no change  Labs/ tests ordered today include:   Orders Placed This Encounter  Procedures   Hepatic function panel   Lipid panel   Basic metabolic panel    Disposition:   FU with me in 1 months  Signed, Lauree Chandler, MD 04/15/2019 4:26 PM    [facility] [address], [place], [place]  [address] Phone: [phone]; Fax: [phone]

## 2019-04-15 NOTE — Telephone Encounter (Signed)
Spoke with pt and he states he has had this cough for years due to smoking since he was 13.  Nothing different from his norm. Advised ok to come to appt.

## 2019-04-21 ENCOUNTER — Telehealth: Payer: Self-pay | Admitting: Cardiovascular Disease

## 2019-04-21 NOTE — Telephone Encounter (Signed)
  Patient is calling because he went to get his prescription for atorvastatin (LIPITOR) 40 MG tablet and the pharmacy said it would be $51.00 and he cannot afford that right now. He states Dr Angelena Form told him it would only be a few dollars. Right now the patient is unemployed.  Please advise.

## 2019-04-21 NOTE — Telephone Encounter (Signed)
Pt reports that Atorvastatin is going to cost $51 for 90 supply and Dr Angelena Form said it would not be expensive. He said even a 30 day supply wouldn't be as cheap as doctor said it would be Pt aware John [name] is out of the office today and will follow up with him tomorrow about this.

## 2019-04-22 NOTE — Telephone Encounter (Signed)
I checked good rx and 90 day supply at [facility] would be $15.27.  I called [facility] and cash price for 90 day supply without insurance is $29.57.  They do honor good rx card. I spoke with pt and gave him above information.  I told him other pharmacies near him may honor good rx card.  He will check with his local pharmacies regarding cost of medication with this card.  He will call us back with update. Was previously on Rosuvastatin but this made him feel weak.

## 2019-05-11 NOTE — Telephone Encounter (Signed)
I spoke with pt. He was able to fill [facility] at [facility].  Was unable to use good rx coupon because at [facility] card could only be used for 30 day supply.  I told pt we could send for 30 day supply if he prefers.  He does not want new prescription sent in at this time but will let us know if he does when we call him with follow up lab results.

## 2019-07-13 ENCOUNTER — Other Ambulatory Visit: Payer: Self-pay

## 2019-07-13 DIAGNOSIS — E7800 Pure hypercholesterolemia, unspecified: Secondary | ICD-10-CM

## 2019-07-13 DIAGNOSIS — I10 Essential (primary) hypertension: Secondary | ICD-10-CM

## 2019-07-13 LAB — LIPID PANEL
Chol/HDL Ratio: 2.2 ratio (ref 0.0–5.0)
Cholesterol, Total: 139 mg/dL (ref 100–199)
HDL: 64 mg/dL (ref >39)
LDL Chol Calc (NIH): 61 mg/dL (ref 0–99)
Triglycerides: 67 mg/dL (ref 0–149)
VLDL Cholesterol Cal: 14 mg/dL (ref 5–40)

## 2019-07-13 LAB — HEPATIC FUNCTION PANEL
ALT: 30 IU/L (ref 0–44)
AST: 27 IU/L (ref 0–40)
Albumin: 3.9 g/dL (ref 3.8–4.8)
Alkaline Phosphatase: 202 IU/L — ABNORMAL HIGH (ref 39–117)
Bilirubin Total: 0.3 mg/dL (ref 0.0–1.2)
Bilirubin, Direct: 0.13 mg/dL (ref 0.00–0.40)
Total Protein: 6.5 g/dL (ref 6.0–8.5)

## 2019-07-13 LAB — BASIC METABOLIC PANEL
BUN/Creatinine Ratio: 13 (ref 10–24)
BUN: 13 mg/dL (ref 8–27)
CO2: 28 mmol/L (ref 20–29)
Calcium: 9.9 mg/dL (ref 8.6–10.2)
Chloride: 102 mmol/L (ref 96–106)
Creatinine, Ser: 1.03 mg/dL (ref 0.76–1.27)
GFR calc Af Amer: 90 mL/min/{1.73_m2} (ref >59)
GFR calc non Af Amer: 78 mL/min/{1.73_m2} (ref >59)
Glucose: 91 mg/dL (ref 65–99)
Potassium: 4.1 mmol/L (ref 3.5–5.2)
Sodium: 143 mmol/L (ref 134–144)

## 2019-11-15 ENCOUNTER — Telehealth: Payer: Self-pay | Admitting: Cardiovascular Disease

## 2019-11-15 NOTE — Telephone Encounter (Signed)
Pt c/o Shortness Of Breath: STAT if SOB developed within the last 24 hours or pt is noticeably SOB on the phone  1. Are you currently SOB (can you hear that pt is SOB on the phone)? Son called on behalf of pt   2. How long have you been experiencing SOB? About 3 weeks  3. Are you SOB when sitting or when up moving around? All the time  4. Are you currently experiencing any other symptoms? Pt has COPD and his o2 sat has been fluctuating between 75-85. The pt is stubborn and wants to just try and make it to his appt on Friday.  Son also wanted to know if he would be allowed to come with the patient to his upcoming appointment

## 2019-11-15 NOTE — Telephone Encounter (Signed)
Spoke with patient, gave recommendations from Dr. Clifton James.  His sats are around 84-87 percent and states that this is about where they always are.  Some of the times his fingers are cold.  Adv of inaccuracy when fingers are cold.  He will continue to monitor, making sure that fingers are warm first.  He gets SOB when he talks a lot or exerts himself and states this is not new or worse today.  He asked about checking with his lung doctor.  Told him he is welcome to call lung doctor as well, but reinforced recommendation from cardiologist to go to ED.    He is worried that he has no job and no insurance.  I adv patient that low O2 sats can cause extra stress and potential danger to all of his organs and since he has no supplemental O2 at home he needs to be evaluated urgently at the emergency department.

## 2019-11-15 NOTE — Telephone Encounter (Signed)
Pts son LM re: the pt and his increased SOB... pts son not on the DPR so I spoke with the pt and he reports that he can just talk with me and he says he can wait until his appt this Friday with Dr. Clifton James but his concern was wanting his girlfriend to come to the appt with him.   I asked him a few questions re: his symptoms and he reports that he has been SOB with exertion but thinks it could be his COPD... he says he does have some mild peripheral edema in his ankles but can still wear his normal shoes.   Pt says he has ben taking his Lasix 40 mg a day. He is not audibly SOB on the hone.   Will forward to Dr. Clifton James for review and if any recommendations re: his Lasix prior to his 11/19/19 appt with him.    ** I have advised the pt that his girlfriend should wait in her car and if Dr. Clifton James would like to speak with her he can have her on Speakerphone during the OV... if he is having trouble speaking due to his SOB then we can call for her to come up after we assess him in the room. Pt agrees.

## 2019-11-15 NOTE — Telephone Encounter (Signed)
If his sats are that low, he needs to go to the ED. It is likely that this is his COPD and he could be helped immediately in the ED. I may not be able to help Friday if it is his lungs. John [name]

## 2019-11-16 ENCOUNTER — Ambulatory Visit (INDEPENDENT_AMBULATORY_CARE_PROVIDER_SITE_OTHER): Payer: Self-pay | Admitting: Pulmonary Disease

## 2019-11-16 ENCOUNTER — Other Ambulatory Visit: Payer: Self-pay

## 2019-11-16 ENCOUNTER — Encounter: Payer: Self-pay | Admitting: Pulmonary Disease

## 2019-11-16 DIAGNOSIS — G479 Sleep disorder, unspecified: Secondary | ICD-10-CM

## 2019-11-16 DIAGNOSIS — R0609 Other forms of dyspnea: Secondary | ICD-10-CM

## 2019-11-16 DIAGNOSIS — F17210 Nicotine dependence, cigarettes, uncomplicated: Secondary | ICD-10-CM

## 2019-11-16 DIAGNOSIS — R0902 Hypoxemia: Secondary | ICD-10-CM

## 2019-11-16 DIAGNOSIS — R0600 Dyspnea, unspecified: Secondary | ICD-10-CM

## 2019-11-16 DIAGNOSIS — J449 Chronic obstructive pulmonary disease, unspecified: Secondary | ICD-10-CM

## 2019-11-16 DIAGNOSIS — Z79899 Other long term (current) drug therapy: Secondary | ICD-10-CM

## 2019-11-16 MED ORDER — AZITHROMYCIN 250 MG PO TABS: ORAL_TABLET | ORAL | 0 refills | Status: DC

## 2019-11-16 MED ORDER — PREDNISONE 10 MG PO TABS: ORAL_TABLET | ORAL | 0 refills | Status: DC

## 2019-11-16 NOTE — Assessment & Plan Note (Addendum)
Current Smoker  1 ppd  100 pack year smoker   Plan: Congratulated patient on decreasing smoking from 2 packs/day to 1 pack/day since April/2020 We will coordinate clinical pharmacy team follow-up to work on smoking cessation Unfortunately patient does not have any health insurance at this time he is on total disability He is used nicotine replacement therapies in the past he did not find them helpful, he only use the patch as a monotherapy Patient is due for flu vaccine, he has not had 1 for 30 years Patient is due for Pneumovax 23 Unfortunately due to his insurance coverage she likely will have to receive these vaccines from the health department At next office visit likely would be good to discuss lung cancer screening program with patient, he may qualify based off of the grant

## 2019-11-16 NOTE — Assessment & Plan Note (Signed)
Current smoker 1 week of increased shortness of breath Known exertional hypoxemia that was discussed in April/2020, patient declined oxygen at that time Patient continues to be maintained on Bevespi  Plan: Z-Pak today Prednisone course today Continue Bevespi Patient will need a walk at next office visit as well as an x-ray We will coordinate clinical pharmacy team appointment to work on smoking cessation Emphasized the importance of the patient to stop smoking Follow-up in our office in 1 to 2 weeks

## 2019-11-16 NOTE — Assessment & Plan Note (Signed)
Patient wondering if he can take a sleeping pill to help with sleep  Plan: I instructed the patient I do not believe would be in his best interest to take a sleeping pill at this time I explained I prefer to treat the COPD exacerbation The patient continues to struggle with sleep he may be a good candidate for a sleep MD referral

## 2019-11-16 NOTE — Assessment & Plan Note (Addendum)
Likely patient is suffering from a COPD exacerbation He is currently afebrile Exertional hypoxemia is unknown issue the patient was found to have that in April/2020 when he refused oxygen, that was the last follow-up that we had with the patient  Plan: We will treat as a COPD exacerbation today We will coordinate close follow-up with our office for a walk, x-ray next week Emphasized to the patient that if oxygen levels continue to drop or he feels that he is having severe shortness of breath and he needs to seek in person evaluation at an urgent care or emergency room

## 2019-11-16 NOTE — Progress Notes (Signed)
Virtual Visit via Telephone Note  I connected with John [name] on 11/16/19 at 10:30 AM EST by telephone and verified that I am speaking with the correct person using two identifiers.  Location: Patient: Home Provider: Office [facility] - [address] [place], [address], [place], [place] [address]   I discussed the limitations, risks, security and privacy concerns of performing an evaluation and management service by telephone and the availability of in person appointments. I also discussed with the patient that there may be a patient responsible charge related to this service. The patient expressed understanding and agreed to proceed.  Patient consented to consult via telephone: Yes People present and their role in pt care: Pt     History of Present Illness:  64 year old male current everyday smoker followed in our office for COPD  Past medical history: Hyperlipidemia, hypertension, CAD Smoking history: Current smoker.  2 packs/day.  100+ pack year smoking history Maintenance: Bevespi  Patient of Dr. Sherene Sires  Chief complaint: Hypoxemia  64 year old male current everyday smoker followed in our office for COPD.  Patient last seen in April/2020.  Patient was found in April/2020 to have exertional hypoxemia.  He declined oxygen at this time.  Patient contacted cardiology on 11/15/2019 reporting that oxygen levels were 84 to 87%.  He reports that this is "normal for him".  Patient does still continue to smoke.  He has thankfully decrease his smoking down to 1 pack/day.  He continues to be adherent to [employer].  Patient struggles with healthcare cost due to not having insurance.  He is on full disability.  He is unsure if his disability would cover him having oxygen.  He is having acute symptoms of increased cough, congestion for 1 week.  He continues to also have increased shortness of breath.  Smoking assessment and cessation counseling  Patient currently smoking: 1 ppd  I have  advised the patient to quit/stop smoking as soon as possible due to high risk for multiple medical problems.  It will also be very difficult for [place] to manage patient's  respiratory symptoms and status if we continue to expose her lungs to a known irritant.  We do not advise e-cigarettes as a form of stopping smoking.  Patient is willing to quit smoking.  Has not set a quit date  Tried patches, did not find it helpful Currently using reduced to quit  I have advised the patient that we can assist and have options of nicotine replacement therapy, provided smoking cessation education today, provided smoking cessation counseling, and provided cessation resources.  Follow-up next office visit office visit for assessment of smoking cessation.   Smoking cessation counseling advised for: 5 min    Observations/Objective:  09/27/2014-CBC with differential-eosinophils relative 1.5, eosinophils absolute 0.1  03/19/2018-chest x-ray-tiny bilateral pleural effusions, coronary stents noted  01/09/2011-2D echocardiogram-LV ejection fraction 50 to 60%, right ventricle cavity size was normal, pulmonary artery systolic pressure was within normal range  Social History   Tobacco Use  Smoking Status Current Every Day Smoker  . Packs/day: 2.00  . Years: 49.00  . Pack years: 98.00  . Types: Cigarettes  Smokeless Tobacco Never Used  Tobacco Comment   2 plus packs per day. He has a 100 + pack - year history of tobacco abuse currently. Former 4 ppd for 25 years.    There is no immunization history on file for this patient.  Hasnt had flu vaccine in 30 years  Needs pneumonia   SIX MIN WALK 12/25/2018  Supplimental Oxygen during Test? (L/min) No  Tech Comments: patient walked fast past, on 2nd lap had to rest. Pt refused O2 during walk, was SOB during the last two laps.[email or web address]   Assessment and Plan:  Cigarette smoker Current Smoker  1 ppd  100 pack year smoker   Plan: Congratulated patient on decreasing  smoking from 2 packs/day to 1 pack/day since April/2020 We will coordinate clinical pharmacy team follow-up to work on smoking cessation Unfortunately patient does not have any health insurance at this time he is on total disability He is used nicotine replacement therapies in the past he did not find them helpful, he only use the patch as a monotherapy Patient is due for flu vaccine, he has not had 1 for 30 years Patient is due for Pneumovax 23 Unfortunately due to his insurance coverage she likely will have to receive these vaccines from the health department At next office visit likely would be good to discuss lung cancer screening program with patient, he may qualify based off of the grant  COPD  GOLD II/ active smoker Current smoker 1 week of increased shortness of breath Known exertional hypoxemia that was discussed in April/2020, patient declined oxygen at that time Patient continues to be maintained on [place]: Z-Pak today Prednisone course today Continue Bevespi Patient will need a walk at next office visit as well as an x-ray We will coordinate clinical pharmacy team appointment to work on smoking cessation Emphasized the importance of the patient to stop smoking Follow-up in our office in 1 to 2 weeks  Medication management Dollene Primrose from [facility] for me No insurance  Dyspnea Likely patient is suffering from a COPD exacerbation He is currently afebrile Exertional hypoxemia is unknown issue the patient was found to have that in April/2020 when he refused oxygen, that was the last follow-up that we had with the patient  Plan: We will treat as a COPD exacerbation today We will coordinate close follow-up with our office for a walk, x-ray next week Emphasized to the patient that if oxygen levels continue to drop or he feels that he is having severe shortness of breath and he needs to seek in person evaluation at an urgent care or emergency room  Sleeping  difficulties Patient wondering if he can take a sleeping pill to help with sleep  Plan: I instructed the patient I do not believe would be in his best interest to take a sleeping pill at this time I explained I prefer to treat the COPD exacerbation The patient continues to struggle with sleep he may be a good candidate for a sleep MD referral  Follow Up Instructions:  Return in about 1 week (around 11/23/2019), or if symptoms worsen or fail to improve, for Follow up with Dr. Melvyn Novas, With Chest Xray, With walk in office.   I discussed the assessment and treatment plan with the patient. The patient was provided an opportunity to ask questions and all were answered. The patient agreed with the plan and demonstrated an understanding of the instructions.   The patient was advised to call back or seek an in-person evaluation if the symptoms worsen or if the condition fails to improve as anticipated.  I provided 30 minutes of non-face-to-face time during this encounter.   Lauraine Rinne, NP

## 2019-11-16 NOTE — Assessment & Plan Note (Signed)
Roxana Hires from [facility] for me No insurance

## 2019-11-16 NOTE — Patient Instructions (Addendum)
You were seen today by John Ceo, NP  for:   1. COPD  GOLD II/ active smoker  - azithromycin (ZITHROMAX) 250 MG tablet; 500mg  (two tablets) today, then 250mg  (1 tablet) for the next 4 days  Dispense: 6 tablet; Refill: 0 - predniSONE (DELTASONE) 10 MG tablet; 4 tabs for 2 days, then 3 tabs for 2 days, 2 tabs for 2 days, then 1 tab for 2 days, then stop  Dispense: 20 tablet; Refill: 0  [name] inhaler >>>2 puffs daily twice a day (4 puffs total daily) >>>This is not a rescue inhaler >>>You take this daily no matter what  Note your daily symptoms > remember "red flags" for COPD:   >>>Increase in cough >>>increase in sputum production >>>increase in shortness of breath or activity  intolerance.   If you notice these symptoms, please call the office to be seen.    2. Cigarette smoker  We recommend that you stop smoking.  >>>You need to set a quit date >>>If you have friends or family who smoke, let them know you are trying to quit and not to smoke around you or in your living environment  Smoking Cessation Resources:  1 800 QUIT NOW  >>> Patient to call this resource and utilize it to help support her quit smoking >>> Keep up your hard work with stopping smoking  You can also contact the [facility] >>>For smoking cessation classes call [phone]  We do not recommend using e-cigarettes as a form of stopping smoking  You can sign up for smoking cessation support texts and information:  >>>https://smokefree.gov/smokefreetxt  Please present to our office in 1-2 weeks for an appointment with the clinical pharmacy team for:  . Smoking cessation . Medication Management  . Medication reconciliation  . Medication Access  . Inhaler teaching    3. Dyspnea on exertion  I believe you are having a COPD exacerbation based off the symptoms that you are describing today.  If you start to have severe chest pain or feel that your shortness of breath is  worsening despite these interventions please present to an emergency room for further evaluation  4. Medication management  Contact your disability coverage to see if they would cover oxygen  As discussed today if you are unable to receive coverage for oxygen then you need to apply for Medicaid  5. Exercise hypoxemia  This is a known finding and your oxygen levels drop with physical exertion.  This was found in April/2020 when you declined oxygen.  We will coordinate a walk at your next follow-up in office here  6. Sleeping difficulties  Work on improving sleep hygiene:   Sleep habits   Keep a sleep diary to help you and your health care provider figure out what could be causing your insomnia. Write down: ? When you sleep. ? When you wake up during the night. ? How well you sleep. ? How rested you feel the next day. ? Any side effects of medicines you are taking. ? What you eat and drink.  Make your bedroom a dark, comfortable place where it is easy to fall asleep. ? Put up shades or blackout curtains to block light from outside. ? Use a white noise machine to block noise. ? Keep the temperature cool.  Limit screen use before bedtime. This includes: ? Watching TV. ? Using your smartphone, tablet, or computer.  Stick to a routine that includes going to bed and waking up at the same  times every day and night. This can help you fall asleep faster. Consider making a quiet activity, such as reading, part of your nighttime routine.  Try to avoid taking naps during the day so that you sleep better at night.  Get out of bed if you are still awake after 15 minutes of trying to sleep. Keep the lights down, but try reading or doing a quiet activity. When you feel sleepy, go back to bed.   We recommend today:  No orders of the defined types were placed in this encounter.  No orders of the defined types were placed in this encounter.  Meds ordered this encounter  Medications  .  azithromycin (ZITHROMAX) 250 MG tablet    Sig: 500mg  (two tablets) today, then 250mg  (1 tablet) for the next 4 days    Dispense:  6 tablet    Refill:  0  . predniSONE (DELTASONE) 10 MG tablet    Sig: 4 tabs for 2 days, then 3 tabs for 2 days, 2 tabs for 2 days, then 1 tab for 2 days, then stop    Dispense:  20 tablet    Refill:  0    Follow Up:    Return in about 1 week (around 11/23/2019), or if symptoms worsen or fail to improve, for Follow up with Dr. Melvyn [name], With Chest Xray, With walk in office.  Please present to our office in 1-2 weeks for an appointment with the clinical pharmacy team for:  . Smoking cessation . Medication Management  . Medication reconciliation  . Medication Access  . Inhaler teaching   Please do your part to reduce the spread of COVID-19:      Reduce your risk of any infection  and COVID19 by using the similar precautions used for avoiding the common cold or flu:  Marland Kitchen Wash your hands often with soap and warm water for at least 20 seconds.  If soap and water are not readily available, use an alcohol-based hand sanitizer with at least 60% alcohol.  . If coughing or sneezing, cover your mouth and nose by coughing or sneezing into the elbow areas of your shirt or coat, into a tissue or into your sleeve (not your hands). John [name] A MASK when in public  . Avoid shaking hands with others and consider head nods or verbal greetings only. . Avoid touching your eyes, nose, or mouth with unwashed hands.  . Avoid close contact with people who are sick. . Avoid places or events with large numbers of people in one location, like concerts or sporting events. . If you have some symptoms but not all symptoms, continue to monitor at home and seek medical attention if your symptoms worsen. . If you are having a medical emergency, call 911.   [place] / e-Visit: [email or web address]           [facility]: [facility]: [phone]                   [facility]: [phone]     It is flu season:   >>> Best ways to protect herself from the flu: Receive the yearly flu vaccine, practice good hand hygiene washing with soap and also using hand sanitizer when available, eat a nutritious meals, get adequate rest, hydrate appropriately   Please contact the office if your symptoms worsen or you have concerns that you are not improving.  Thank you for choosing [facility] for your healthcare, and for allowing [place] to partner with you on your healthcare journey. I am thankful to be able to provide care to you today.   John Quaker FNP-C

## 2019-11-19 ENCOUNTER — Ambulatory Visit (INDEPENDENT_AMBULATORY_CARE_PROVIDER_SITE_OTHER): Payer: Self-pay | Admitting: Cardiovascular Disease

## 2019-11-19 ENCOUNTER — Encounter: Payer: Self-pay | Admitting: Cardiovascular Disease

## 2019-11-19 ENCOUNTER — Other Ambulatory Visit: Payer: Self-pay

## 2019-11-19 VITALS — BP 116/70 | HR 101 | Ht 70.0 in | Wt 150.8 lb

## 2019-11-19 DIAGNOSIS — E7800 Pure hypercholesterolemia, unspecified: Secondary | ICD-10-CM

## 2019-11-19 DIAGNOSIS — I2510 Atherosclerotic heart disease of native coronary artery without angina pectoris: Secondary | ICD-10-CM

## 2019-11-19 DIAGNOSIS — I10 Essential (primary) hypertension: Secondary | ICD-10-CM

## 2019-11-19 DIAGNOSIS — I5033 Acute on chronic diastolic (congestive) heart failure: Secondary | ICD-10-CM

## 2019-11-19 DIAGNOSIS — Z720 Tobacco use: Secondary | ICD-10-CM

## 2019-11-19 NOTE — Patient Instructions (Addendum)
Medication Instructions:  1) INCREASE LASIX to 80 mg for 3 days only then resume Lasix 40 mg daily *If you need a refill on your cardiac medications before your next appointment, please call your pharmacy*   Follow-Up: At [facility], you and your health needs are our priority.  As part of our continuing mission to provide you with exceptional heart care, we have created designated Provider Care Teams.  These Care Teams include your primary Cardiologist (physician) and Advanced Practice Providers (APPs -  Physician Assistants and Nurse Practitioners) who all work together to provide you with the care you need, when you need it. Your next appointment:   6 month(s) The format for your next appointment:   In Person Provider:   You may see Verne Carrow, MD or one of the following Advanced Practice Providers on your designated Care Team:    Ronie Spies, PA-C  Jacolyn Reedy, PA-C

## 2019-11-19 NOTE — Progress Notes (Signed)
Chief Complaint  Patient presents with  . Follow-up    CAD   History of Present Illness: 64 yo male with history of ongoing tobacco abuse, CAD , HTN, hyperlipidemia and COPD who is here today for cardiac follow up. He was admitted to [facility] December 2011 with a NSTEMI. Cardiac cath 09/17/10 and was found to have a severe stenosis of the LAD which was treated with a drug eluting stent. He was also found to have moderate disease in the RCA (70%) and Circumflex (70%) managed medically. Cardiac cath June 2012 with stable CAD. He  had 3 admissions in July 2013 with severe epistaxis causing hemorrhagic shock with tachycardia and drop in blood pressure. This was treated with packing and transfusion. His Plavix was stopped. He was admitted January 2016 with unstable angina. Cardiac cath January 2016 with severe mid LAD stenosis, severe mid Circumflex stenosis. I placed a drug eluting stent in the mid LAD and a drug eluting stent in the mid Circumflex. LV function normal. I saw him in May 2016 and he c/o fatigue and dyspnea. His Coreg was stopped and he had resolution of his symptoms. He missed follow up appointments in 2017-2020 due to lack of insurance and cost. I saw him February 2020 with c/o dyspnea and lower extremity edema. He had been off of all medications due to losing his job and having no money. He continued to smoke at least one pack per day and was drinking up to a 6 pack of beer per day. He described dyspnea with minimal exertion, even talking and could not lay flat. He had been seen in the Health.Department in [facility] and workup has been underway for dyspnea. Mention of PFTs but no way to pay for it. I felt that his dyspnea was a combination of underlying lung disease and volume overload. He refused admission or an echo. BNP and BMET were normal. I started Lasix 40 mg daily. I saw him by virtual visit 12/23/18 and he reported great improvement in his LE edema and dyspnea. He has  re-established in the Pulmonary clinic with Dr. Sherene Sires and is now on an inhaler.    He called our office this week with c/o worsened dyspnea, cough and low oxygen saturations. He was seen in the pulmonary office 11/16/19 and felt to have a COPD exacerbation. He is now being treated with steroids and antibiotics per the pulmonary office. He is here today for follow up. The patient denies any chest pain, palpitations, lower extremity edema, orthopnea, PND, dizziness, near syncope or syncope.   Primary Care Physician: Patient, No Pcp Per   Past Medical History:  Diagnosis Date  . Alcohol abuse 12/27/2010  . Allergic rhinitis, cause unspecified 12/27/2010  . Anal warts 04/11/2012  . CAD, NATIVE VESSEL 10/04/2010  . COPD (chronic obstructive pulmonary disease) (HCC)    "a touch" (09/28/2013)  . History of blood transfusion 03/2012   related to nose bleed  . HYPERLIPIDEMIA-MIXED 10/04/2010  . HYPERTENSION, BENIGN 10/04/2010  . Myocardial infarction (HCC) 09/16/2010  . Pneumonia    "when I was a kid"  . TOBACCO ABUSE 10/04/2010    Past Surgical History:  Procedure Laterality Date  . CARDIAC CATHETERIZATION  ~ 2012  . CORONARY ANGIOPLASTY WITH STENT PLACEMENT  09/17/2010; 09/28/2014   "1; 2"  . FINGER SURGERY Left    "almost cut off" tip of 2nd digit  . LEFT HEART CATHETERIZATION WITH CORONARY ANGIOGRAM N/A 09/28/2014   Procedure: LEFT HEART CATHETERIZATION  WITH CORONARY ANGIOGRAM;  Surgeon: Burnell Blanks, MD;  Location: [facility] CATH LAB;  Service: Cardiovascular;  Laterality: N/A;  . NASAL ENDOSCOPY WITH EPISTAXIS CONTROL Bilateral 03/2012  . VASECTOMY      Current Outpatient Medications  Medication Sig Dispense Refill  . aspirin EC 81 MG tablet Take 81 mg by mouth daily.    Marland Kitchen atorvastatin (LIPITOR) 40 MG tablet Take 1 tablet (40 mg total) by mouth daily. 90 tablet 3  . azithromycin (ZITHROMAX) 250 MG tablet 500mg  (two tablets) today, then 250mg  (1 tablet) for the next 4 days 6 tablet 0  .  furosemide (LASIX) 40 MG tablet Take 1 tablet (40 mg total) by mouth daily. 90 tablet 3  . Glycopyrrolate-Formoterol ([name]) 9-4.8 MCG/ACT AERO Inhale 2 puffs into the lungs 2 (two) times daily. 23.6 g 0  . Multiple Vitamin (MULTIVITAMIN WITH MINERALS) TABS tablet Take 1 tablet by mouth daily.    . nitroGLYCERIN (NITROSTAT) 0.4 MG SL tablet Place 1 tablet (0.4 mg total) under the tongue every 5 (five) minutes as needed for chest pain. 25 tablet 6  . predniSONE (DELTASONE) 10 MG tablet 4 tabs for 2 days, then 3 tabs for 2 days, 2 tabs for 2 days, then 1 tab for 2 days, then stop 20 tablet 0   No current facility-administered medications for this visit.    Allergies  Allergen Reactions  . Penicillins Other (See Comments)    Passes out    Social History   Socioeconomic History  . Marital status: Single    Spouse name: Not on file  . Number of children: Not on file  . Years of education: Not on file  . Highest education level: Not on file  Occupational History  . Occupation: Health and safety inspector work     Fish farm manager: [employer]    Comment: [name]  Tobacco Use  . Smoking status: Current Every Day Smoker    Packs/day: 2.00    Years: 49.00    Pack years: 98.00    Types: Cigarettes  . Smokeless tobacco: Never Used  . Tobacco comment: 2 plus packs per day. He has a 100 + pack - year history of tobacco abuse currently. Former 4 ppd for 25 years.  Substance and Sexual Activity  . Alcohol use: Yes    Alcohol/week: 144.0 standard drinks    Types: 144 Cans of beer per week    Comment: 09/28/2014 "12 pack of beer per night"  . Drug use: No  . Sexual activity: Yes    Birth control/protection: None  Other Topics Concern  . Not on file  Social History Narrative   The patient lives in [place] with his girlfriend. 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 He is not routinely exercising.   Social Determinants of Health   Financial Resource Strain:   . Difficulty of Paying  Living Expenses: Not on file  Food Insecurity:   . Worried About Charity fundraiser in the Last Year: Not on file  . Ran Out of Food in the Last Year: Not on file  Transportation Needs:   . Lack of Transportation (Medical): Not on file  . Lack of Transportation (Non-Medical): Not on file  Physical Activity:   . Days of Exercise per Week: Not on file  . Minutes of Exercise per Session: Not on file  Stress:   . Feeling of Stress : Not on file  Social Connections:   . Frequency of Communication with Friends  and Family: Not on file  . Frequency of Social Gatherings with Friends and Family: Not on file  . Attends Religious Services: Not on file  . Active Member of Clubs or Organizations: Not on file  . Attends Banker Meetings: Not on file  . Marital Status: Not on file  Intimate Partner Violence:   . Fear of Current or Ex-Partner: Not on file  . Emotionally Abused: Not on file  . Physically Abused: Not on file  . Sexually Abused: Not on file    Family History  Problem Relation Age of Onset  . Brain cancer Mother   . Heart disease Father     Review of Systems:  As stated in the HPI and otherwise negative.   BP 116/70   Pulse (!) 101   Ht 5\' 10"  (1.778 m)   Wt 150 lb 12.8 oz (68.4 kg)   BMI 21.64 kg/m   Physical Examination:  General: Well developed, well nourished, NAD  HEENT: OP clear, mucus membranes moist  SKIN: warm, dry. No rashes. Neuro: No focal deficits  Musculoskeletal: Muscle strength 5/5 all ext  Psychiatric: Mood and affect normal  Neck: No JVD, no carotid bruits, no thyromegaly, no lymphadenopathy.  Lungs:Clear bilaterally with decreased BS bilaterally. No wheezes, rhonci, crackles Cardiovascular: Regular rate and rhythm. No murmurs, gallops or rubs. Abdomen:Soft. Bowel sounds present. Non-tender.  Extremities: 1-2+ bilateral lower extremity edema.   Cardiac cath January 2016: PCI Note: He was given an additional 6000 units of IV heparin.  He was given Plavix 600 mg po x 1. When the ACT was over 200, I engaged the left main with a XB LAD 3.5 guiding catheter.  Lesion #1: (mid Circumflex): I advanced a Cougar IC wire down the Circumflex. A 2.5 x 12 mm balloon was used to pre-dilate the severe stenosis in the mid Circumflex artery. I then carefully positioned and deployed a 3.5 x 20 mm Promus Premier DES in the mid Circumflex. The stent was post-dilated with a 3.75 x 15 mm [place] balloon x 1. The stenosis was taken from 99% down to 0%.  Lesion #2 (mid LAD): I then pulled the wire back and advanced down the LAD. A 2.5 x 12 mm balloon was used to pre-dilate the mid LAD stenosis. I then carefully positioned a 2.75 x 16 mm Promus Premier DES in the mid LAD, overlapping with the old stent on the distal edge of the old stent. The stent was post-dilated with a 3.0 x 12 mm [place] balloon x 1. The stenosis was taken from 80% down to 0%.  The sheath was removed from the right radial artery and a Terumo hemostasis band was applied at the arteriotomy site on the right wrist. There were no immediate complications. The patient was taken to the recovery area in stable condition.   Hemodynamic Findings: Central aortic pressure: 99/68 Left ventricular pressure: 99/5/12 Angiographic Findings: Left main: No obstructive disease.  Left Anterior Descending Artery: Large caliber vessel that courses to the apex. There is mild plaque in the proximal vessel.The mid vessel has a patent stent without restenosis. Just beyond the stented segment in the mid LAD there is a focal 80% stenosis. This is seen in several views and is felt to be flow limiting.  Circumflex Artery: Large caliber vessel with small first obtuse marginal branch and large second obtuse marginal branch. The mid AV groove Circumflex has a 99% stenosis. The superior branch of OM2 has a focal 40% stenosis, unchanged from  last cath.  Right Coronary Artery: Large dominant vessel with diffuse 50% proximal  stenosis, unchanged from last cath.  Left Ventricular Angiogram: LVEF=60-65%.   EKG:  EKG is ordered today. The ekg ordered today demonstrates Sinus tachycardia, rate 101 bpm. Non-specific ST abn, unchanged from last EKG. Wandering baseline.   Recent Labs: 07/13/2019: ALT 30; BUN 13; Creatinine, Ser 1.03; Potassium 4.1; Sodium 143   Lipid Panel    Component Value Date/Time   CHOL 139 07/13/2019 0733   TRIG 67 07/13/2019 0733   TRIG 82 08/15/2006 0929   HDL 64 07/13/2019 0733   CHOLHDL 2.2 07/13/2019 0733   CHOLHDL 2.3 11/03/2015 0733   VLDL 23 11/03/2015 0733   LDLCALC 61 07/13/2019 0733     Wt Readings from Last 3 Encounters:  11/19/19 150 lb 12.8 oz (68.4 kg)  04/15/19 145 lb (65.8 kg)  12/25/18 151 lb 6.4 oz (68.7 kg)     Other studies Reviewed: Additional studies/ records that were reviewed today include: . Review of the above records demonstrates:    Assessment and Plan:   1. CAD without angina: He has no exertional chest pain. His dyspnea is felt to be due to his COPD. He does not tolerate beta blockers due to fatigue. No recent assessment of LV systolic function. He has refused an echo. Continue ASA and statin   2. Tobacco abuse: I have again advised him to stop smoking  3. HTN: BP is within normal limits. He does not take any medications for his BP  4. Hyperlipidemia: LDL at goal in October 2020. Continue statin.   5. Chronic diastolic CHF: No recent assessment of LV systolic function. He has refused an echo over the past few years. Weight is up several pounds today and he has 1-2 + bilateral LE edema. I will have him increase his Lasix to 40 mg po BID for 3 days. If he does not have resolution of his LE edema, may need to continue a higher dose of Lasix.    6. Dyspnea/COPD exacerbation: His most recent worsening in respiratory status seems to be driven by his COPD. He is being treated with steroids and antibiotics in the pulmonary office. He has noticed some  improvement since starting this therapy 3 days ago. He has follow up there next week.   Current medicines are reviewed at length with the patient today.  The patient does not have concerns regarding medicines.  The following changes have been made:  no change  Labs/ tests ordered today include:   Orders Placed This Encounter  Procedures  . EKG 12-Lead    Disposition:   FU with me in 1 months  Signed, Verne Carrow, MD 11/19/2019 11:33 AM    [facility] [address] [place], [place], [place]  [address] Phone: [phone]; Fax: [phone]

## 2019-11-22 ENCOUNTER — Other Ambulatory Visit: Payer: Self-pay

## 2019-11-22 DIAGNOSIS — J449 Chronic obstructive pulmonary disease, unspecified: Secondary | ICD-10-CM

## 2019-11-22 DIAGNOSIS — R0609 Other forms of dyspnea: Secondary | ICD-10-CM

## 2019-11-22 NOTE — Progress Notes (Signed)
@Patient  ID: John [name], male    DOB: March 11, 1956, 64 y.o.   MRN: [id number]  Chief Complaint  Patient presents with   Follow-up    Patient is here for follow up for shortness of breath. Patient is having difficulty breathing at this time.     Referring provider: No ref. provider found  HPI:  64 year old male current everyday smoker followed in our office for COPD  Past medical history: Hyperlipidemia, hypertension, CAD Smoking history: Current smoker.  2 packs/day.  100+ pack year smoking history Maintenance: Bevespi  Patient of Dr. Melvyn Novas  11/23/2019  - Visit   64 year old male current everyday smoker followed in our office for suspected COPD with emphysema.  Patient completed a 1 week follow-up after a telephonic visit.  On arrival to our office patient's oxygen saturations were 64% on room air.  Patient with blue lips. patient was immediately placed on 2 L of oxygen.  Oxygen levels returned to 98% on 2 L.  Patient does still continue to smoke 2 packs/day.  Greater than 100-pack-year smoking history.  Patient reports that for most of his 55s and 30s he smoked 4 to 5 packs/day.  Patient also with potential environmental exposures as he worked at [employer].  He did not wear a mask.  He reports multiple exposures to fiber products in the air.  Last in person office visit was in April/2020 which was his consult.  Patient was recommended to be started on exertional oxygen at that time patient declined.  Patient was also started on Bevespi which the patient receives through [employer].   Questionaires / Pulmonary Flowsheets:   MMRC: mMRC Dyspnea Scale mMRC Score  11/23/2019 3    Tests:   09/27/2014-CBC with differential-eosinophils relative 1.5, eosinophils absolute 0.1  03/19/2018-chest x-ray-tiny bilateral pleural effusions, coronary stents noted  01/09/2011-2D echocardiogram-LV ejection fraction 50 to 60%, right ventricle cavity size was normal, pulmonary artery  systolic pressure was within normal range  FENO:  No results found for: NITRICOXIDE  PFT: No flowsheet data found.  WALK:  SIX MIN WALK 11/23/2019 12/25/2018  Supplimental Oxygen during Test? (L/min) Yes No  O2 Flow Rate 2 -  Type Continuous -  Tech Comments: Patient walked moderate pace and maintained good O2 sats while on 2L patient walked fast past, on 2nd lap had to rest. Pt refused O2 during walk, was SOB during the last two laps.[email or web address]   Imaging: DG Chest 2 View  Result Date: 11/23/2019 CLINICAL DATA:  Follow-up COPD.  Worsening shortness of breath. EXAM: CHEST - 2 VIEW COMPARISON:  03/18/2018 FINDINGS: There is hyperinflation of the lungs compatible with COPD. Heart and mediastinal contours are within normal limits. No focal opacities or effusions. No acute bony abnormality. IMPRESSION: COPD.  No active disease. Electronically Signed   By: Rolm Baptise M.D.   On: 11/23/2019 11:11    Lab Results:  CBC    Component Value Date/Time   WBC 9.2 11/23/2019 1237   RBC 5.03 11/23/2019 1237   HGB 17.2 (H) 11/23/2019 1237   HCT 51.8 11/23/2019 1237   PLT 254.0 11/23/2019 1237   MCV 103.1 (H) 11/23/2019 1237   MCH 35.6 (H) 09/29/2014 0535   MCHC 33.2 11/23/2019 1237   RDW 14.1 11/23/2019 1237   LYMPHSABS 0.4 (L) 11/23/2019 1237   MONOABS 0.4 11/23/2019 1237   EOSABS 0.0 11/23/2019 1237   BASOSABS 0.0 11/23/2019 1237    BMET    Component Value Date/Time   NA 140 11/23/2019 1237  NA 143 07/13/2019 0733   K 4.9 11/23/2019 1237   CL 93 (L) 11/23/2019 1237   CO2 47 (H) 11/23/2019 1237   GLUCOSE 106 (H) 11/23/2019 1237   GLUCOSE 123 (H) 08/15/2006 0929   BUN 28 (H) 11/23/2019 1237   BUN 13 07/13/2019 0733   CREATININE 1.16 11/23/2019 1237   CALCIUM 10.1 11/23/2019 1237   GFRNONAA 78 07/13/2019 0733   GFRAA 90 07/13/2019 0733    BNP No results found for: BNP  ProBNP    Component Value Date/Time   PROBNP 75 11/16/2018 1446    Specialty Problems      Pulmonary  Problems   Allergic rhinitis, cause unspecified   Dyspnea   Epistaxis   Emphysema lung (HCC)    Active smoker - PFT's 10/01/06 FEV1 2.23 (60%) ratio 47 with 5% resp to saba and DLCO 102% and classic curvature to f/v loop  - 12/25/2018  After extensive coaching inhaler device,  effectiveness =    75% so try BEVESPI 2bid and if helps then contact [place] for [place] as [place] no insurance/funds       Chronic respiratory failure with hypoxia (HCC)    11/23/2019-presented to office on room air, satting 64% 11/23/2019-discharge from office on 2 L, 24/7         Allergies  Allergen Reactions   Penicillins Other (See Comments)    Passes out     There is no immunization history on file for this patient.  Unable to discuss immunization history at office visit today due to complexity of care as well as acute concerns.  Patient should receive the seasonal flu vaccine as well as the pneumonia shot.  Unfortunately patient does not have insurance so likely he would have to receive these from health department.  Past Medical History:  Diagnosis Date   Alcohol abuse 12/27/2010   Allergic rhinitis, cause unspecified 12/27/2010   Anal warts 04/11/2012   CAD, NATIVE VESSEL 10/04/2010   COPD (chronic obstructive pulmonary disease) (HCC)    "a touch" (09/28/2013)   History of blood transfusion 03/2012   related to nose bleed   HYPERLIPIDEMIA-MIXED 10/04/2010   HYPERTENSION, BENIGN 10/04/2010   Myocardial infarction (HCC) 09/16/2010   Pneumonia    "when I was a kid"   TOBACCO ABUSE 10/04/2010    Tobacco History: Social History   Tobacco Use  Smoking Status Current Every Day Smoker   Packs/day: 2.00   Years: 50.00   Pack years: 100.00   Types: Cigarettes  Smokeless Tobacco Never Used  Tobacco Comment   2 plus packs per day. He has a 100 + pack - year history of tobacco abuse currently. Former 4 ppd for 25 years.   Ready to quit: Not Answered Counseling given: Not Answered Comment: 2 plus  packs per day. He has a 100 + pack - year history of tobacco abuse currently. Former 4 ppd for 25 years.  Unable to fully discuss his smoking cessation today.  We will coordinate close follow-up with our clinical pharmacy team to work on supporting the patient for stopping smoking.  Outpatient Encounter Medications as of 11/23/2019  Medication Sig   aspirin EC 81 MG tablet Take 81 mg by mouth daily.   atorvastatin (LIPITOR) 40 MG tablet Take 1 tablet (40 mg total) by mouth daily.   azithromycin (ZITHROMAX) 250 MG tablet 500mg  (two tablets) today, then 250mg  (1 tablet) for the next 4 days   furosemide (LASIX) 40 MG tablet Take 1 tablet (40 mg  total) by mouth daily.   Glycopyrrolate-Formoterol ([name]) 9-4.8 MCG/ACT AERO Inhale 2 puffs into the lungs 2 (two) times daily.   Multiple Vitamin (MULTIVITAMIN WITH MINERALS) TABS tablet Take 1 tablet by mouth daily.   nitroGLYCERIN (NITROSTAT) 0.4 MG SL tablet Place 1 tablet (0.4 mg total) under the tongue every 5 (five) minutes as needed for chest pain.   predniSONE (DELTASONE) 10 MG tablet 4 tabs for 2 days, then 3 tabs for 2 days, 2 tabs for 2 days, then 1 tab for 2 days, then stop   No facility-administered encounter medications on file as of 11/23/2019.     Review of Systems  Review of Systems  Constitutional: Positive for fatigue. Negative for activity change, diaphoresis and fever.  HENT: Positive for congestion. Negative for postnasal drip, sneezing, sore throat and trouble swallowing.   Eyes: Negative.   Respiratory: Positive for cough, shortness of breath and wheezing.   Cardiovascular: Positive for leg swelling. Negative for chest pain and palpitations.  Gastrointestinal: Negative for diarrhea, nausea and vomiting.  Endocrine: Negative.   Genitourinary: Negative.   Musculoskeletal: Negative.  Negative for arthralgias.  Skin: Negative.   Allergic/Immunologic: Negative.   Neurological: Negative.   Hematological:  Negative.   Psychiatric/Behavioral: Negative.  Negative for dysphoric mood. The patient is not nervous/anxious.      Physical Exam  BP 118/64 (BP Location: Right Arm, Patient Position: Sitting, Cuff Size: Normal)    Pulse 100    Ht 5\' 11"  (1.803 m)    Wt 150 lb 9.6 oz (68.3 kg)    SpO2 (!) 64%    BMI 21.00 kg/m   100 percent on 2L   Wt Readings from Last 5 Encounters:  11/23/19 150 lb 9.6 oz (68.3 kg)  11/19/19 150 lb 12.8 oz (68.4 kg)  04/15/19 145 lb (65.8 kg)  12/25/18 151 lb 6.4 oz (68.7 kg)  12/23/18 155 lb (70.3 kg)    BMI Readings from Last 5 Encounters:  11/23/19 21.00 kg/m  11/19/19 21.64 kg/m  04/15/19 20.81 kg/m  12/25/18 21.72 kg/m  12/23/18 21.62 kg/m     Physical Exam Vitals and nursing note reviewed.  Constitutional:      General: He is not in acute distress.    Appearance: He is obese.     Comments: Thin, frail elderly male  HENT:     Head: Normocephalic and atraumatic.     Right Ear: Hearing, tympanic membrane, ear canal and external ear normal. There is no impacted cerumen.     Left Ear: Hearing, tympanic membrane, ear canal and external ear normal. There is no impacted cerumen.     Nose: Nose normal. No mucosal edema or rhinorrhea.     Right Turbinates: Not enlarged.     Left Turbinates: Not enlarged.     Mouth/Throat:     Mouth: Mucous membranes are dry.     Pharynx: Oropharynx is clear. No oropharyngeal exudate.  Eyes:     Pupils: Pupils are equal, round, and reactive to light.  Cardiovascular:     Rate and Rhythm: Regular rhythm. Tachycardia present.     Pulses: Normal pulses.     Heart sounds: Normal heart sounds. No murmur.  Pulmonary:     Effort: Pulmonary effort is normal.     Breath sounds: No decreased breath sounds, wheezing or rales.     Comments: Diminished breath sounds throughout exam Musculoskeletal:        General: Swelling present.     Cervical back: Normal  range of motion.     Right lower leg: Edema (3+ lower extremity  swelling) present.     Left lower leg: Edema (3+ lower extremity swelling) present.  Lymphadenopathy:     Cervical: No cervical adenopathy.  Skin:    General: Skin is warm and dry.     Capillary Refill: Capillary refill takes less than 2 seconds.     Findings: No erythema or rash.  Neurological:     General: No focal deficit present.     Mental Status: He is alert and oriented to person, place, and time.     Motor: No weakness.     Coordination: Coordination normal.     Gait: Gait is intact. Gait (Tolerated walk in office today on oxygen) normal.  Psychiatric:        Mood and Affect: Mood normal. Affect is flat.        Behavior: Behavior normal. Behavior is cooperative.        Thought Content: Thought content normal.        Judgment: Judgment normal.       Assessment & Plan:   Emphysema lung (HCC) Current smoker Presenting to office today on room air satting 64% Walk today in office patient requires 2 L with physical exertion and at rest  Plan: Continue Bevespi Will have close clinical pharmacy team follow-up to help patient with [place] for [place] for [place] Follow-up with Dr. Sherene Sires in 1 week Lab work Chest x-ray   Chronic respiratory failure with hypoxia (HCC) Previously seen at consult in April/2020 where patient was told he needed oxygen with physical exertion, he declined Persistent exertional hypoxemia, televisit last week Presenting to office today with oxygen saturations of 64% Walk in office patient tolerated oxygen at 2 L, no desaturations  Discussion: A significant amount of work and effort went into trying to help coordinate patient's oxygen.  Patient discharged from our office with oxygen from [name].  Unfortunately patient has no insurance coverage.  Patient reports that he cannot qualify for Medicaid.  He is on disability.  Patient can qualify for Medicare in October/2021 per patient and girlfriend.  This insurance coverage is a significant barrier for the  patient.  We explored multiple options both through [facility] as well as to [name].  Unfortunately the patient's options would be he can either pay for oxygen out-of-pocket at $150 a month per [name] or he can see if he can be qualified through an inpatient coverage program at [facility] health, this would require him presenting to the emergency room  Plan: Continue oxygen therapy 2 L 24/7 Lab work today X-ray 1 week follow-up with Dr. Sherene Sires   Addendum: Contacted patient discussed options of coverage for oxygen.  He reports that he will prefer to explore insurance options that he can obtain coverage for.  In the meantime he will be cash pay.  Notified DME company of patient's choice.  Dyspnea Continued dyspnea Suspect this is likely multifactorial Given the patient's tachycardia, lower extremity swelling and persistent hypoxemia we will rule out other options Walk today in office patient did not have desaturations while maintained on 2 L continuous  Plan: X-ray today Lab work today Discharge from our office on 2 L of O2  Addendum: D-dimer has come back elevated.  Contacted patient.  Informed that he needs to present to an emergency room for further evaluation.  Medication management Currently receives Bevespi through [facility] and me Patient reports that he needs to reapply He has  no insurance coverage He is looking for assistance from our office to help with completing this application  Plan: [place] to [place] for [place] We will schedule clinical pharmacy team follow-up with our office to ensure patient's application gets resubmitted    Return in about 1 week (around 11/30/2019), or if symptoms worsen or fail to improve, for Follow up with Dr. Sherene Sires.   Coral Ceo, NP 11/23/2019   This appointment required 65 minutes of patient care (this includes precharting, chart review, review of results, face-to-face care, etc.).

## 2019-11-23 ENCOUNTER — Ambulatory Visit (INDEPENDENT_AMBULATORY_CARE_PROVIDER_SITE_OTHER): Payer: Self-pay | Admitting: Pulmonary Disease

## 2019-11-23 ENCOUNTER — Emergency Department (HOSPITAL_COMMUNITY): Payer: Self-pay

## 2019-11-23 ENCOUNTER — Ambulatory Visit (INDEPENDENT_AMBULATORY_CARE_PROVIDER_SITE_OTHER): Payer: Self-pay

## 2019-11-23 ENCOUNTER — Emergency Department (HOSPITAL_COMMUNITY)
Admission: EM | Admit: 2019-11-23 | Discharge: 2019-11-23 | Disposition: A | Discharge status: Home / Self Care | Payer: Self-pay | Attending: Emergency Medicine | Admitting: Emergency Medicine

## 2019-11-23 ENCOUNTER — Encounter (HOSPITAL_COMMUNITY): Payer: Self-pay

## 2019-11-23 ENCOUNTER — Other Ambulatory Visit: Payer: Self-pay

## 2019-11-23 ENCOUNTER — Encounter: Payer: Self-pay | Admitting: Pulmonary Disease

## 2019-11-23 ENCOUNTER — Telehealth: Payer: Self-pay | Admitting: Cardiovascular Disease

## 2019-11-23 VITALS — BP 118/64 | HR 100 | Ht 71.0 in | Wt 150.6 lb

## 2019-11-23 DIAGNOSIS — F17210 Nicotine dependence, cigarettes, uncomplicated: Secondary | ICD-10-CM | POA: Insufficient documentation

## 2019-11-23 DIAGNOSIS — I10 Essential (primary) hypertension: Secondary | ICD-10-CM | POA: Insufficient documentation

## 2019-11-23 DIAGNOSIS — J449 Chronic obstructive pulmonary disease, unspecified: Secondary | ICD-10-CM

## 2019-11-23 DIAGNOSIS — R0600 Dyspnea, unspecified: Secondary | ICD-10-CM

## 2019-11-23 DIAGNOSIS — Z79899 Other long term (current) drug therapy: Secondary | ICD-10-CM | POA: Insufficient documentation

## 2019-11-23 DIAGNOSIS — R7989 Other specified abnormal findings of blood chemistry: Secondary | ICD-10-CM | POA: Insufficient documentation

## 2019-11-23 DIAGNOSIS — I2510 Atherosclerotic heart disease of native coronary artery without angina pectoris: Secondary | ICD-10-CM | POA: Insufficient documentation

## 2019-11-23 DIAGNOSIS — J441 Chronic obstructive pulmonary disease with (acute) exacerbation: Secondary | ICD-10-CM | POA: Insufficient documentation

## 2019-11-23 DIAGNOSIS — Z7982 Long term (current) use of aspirin: Secondary | ICD-10-CM | POA: Insufficient documentation

## 2019-11-23 DIAGNOSIS — R0602 Shortness of breath: Secondary | ICD-10-CM

## 2019-11-23 DIAGNOSIS — R0609 Other forms of dyspnea: Secondary | ICD-10-CM

## 2019-11-23 DIAGNOSIS — J9611 Chronic respiratory failure with hypoxia: Secondary | ICD-10-CM

## 2019-11-23 LAB — CBC WITH DIFFERENTIAL/PLATELET
Basophils Absolute: 0 10*3/uL (ref 0.0–0.1)
Basophils Relative: 0.3 % (ref 0.0–3.0)
Eosinophils Absolute: 0 10*3/uL (ref 0.0–0.7)
Eosinophils Relative: 0 % (ref 0.0–5.0)
HCT: 51.8 % (ref 39.0–52.0)
Hemoglobin: 17.2 g/dL — ABNORMAL HIGH (ref 13.0–17.0)
Lymphocytes Relative: 4.3 % — ABNORMAL LOW (ref 12.0–46.0)
Lymphs Abs: 0.4 10*3/uL — ABNORMAL LOW (ref 0.7–4.0)
MCHC: 33.2 g/dL (ref 30.0–36.0)
MCV: 103.1 fl — ABNORMAL HIGH (ref 78.0–100.0)
Monocytes Absolute: 0.4 10*3/uL (ref 0.1–1.0)
Monocytes Relative: 4.8 % (ref 3.0–12.0)
Neutro Abs: 8.3 10*3/uL — ABNORMAL HIGH (ref 1.4–7.7)
Neutrophils Relative %: 90.6 % — ABNORMAL HIGH (ref 43.0–77.0)
Platelets: 254 10*3/uL (ref 150.0–400.0)
RBC: 5.03 Mil/uL (ref 4.22–5.81)
RDW: 14.1 % (ref 11.5–15.5)
WBC: 9.2 10*3/uL (ref 4.0–10.5)

## 2019-11-23 LAB — BASIC METABOLIC PANEL
Anion gap: 8 (ref 5–15)
BUN: 28 mg/dL — ABNORMAL HIGH (ref 8–23)
CO2: 38 mmol/L — ABNORMAL HIGH (ref 22–32)
Calcium: 9 mg/dL (ref 8.9–10.3)
Chloride: 94 mmol/L — ABNORMAL LOW (ref 98–111)
Creatinine, Ser: 1.15 mg/dL (ref 0.61–1.24)
GFR calc Af Amer: >60 mL/min (ref >60)
GFR calc non Af Amer: >60 mL/min (ref >60)
Glucose, Bld: 96 mg/dL (ref 70–99)
Potassium: 5.1 mmol/L (ref 3.5–5.1)
Sodium: 140 mmol/L (ref 135–145)

## 2019-11-23 LAB — CBC
HCT: 54.1 % — ABNORMAL HIGH (ref 39.0–52.0)
Hemoglobin: 17.5 g/dL — ABNORMAL HIGH (ref 13.0–17.0)
MCH: 34.1 pg — ABNORMAL HIGH (ref 26.0–34.0)
MCHC: 32.3 g/dL (ref 30.0–36.0)
MCV: 105.5 fL — ABNORMAL HIGH (ref 80.0–100.0)
Platelets: 241 10*3/uL (ref 150–400)
RBC: 5.13 MIL/uL (ref 4.22–5.81)
RDW: 13.2 % (ref 11.5–15.5)
WBC: 9.1 10*3/uL (ref 4.0–10.5)
nRBC: 0 % (ref 0.0–0.2)

## 2019-11-23 LAB — COMPREHENSIVE METABOLIC PANEL
ALT: 27 U/L (ref 0–53)
AST: 22 U/L (ref 0–37)
Albumin: 3.6 g/dL (ref 3.5–5.2)
Alkaline Phosphatase: 136 U/L — ABNORMAL HIGH (ref 39–117)
BUN: 28 mg/dL — ABNORMAL HIGH (ref 6–23)
CO2: 47 mEq/L — ABNORMAL HIGH (ref 19–32)
Calcium: 10.1 mg/dL (ref 8.4–10.5)
Chloride: 93 mEq/L — ABNORMAL LOW (ref 96–112)
Creatinine, Ser: 1.16 mg/dL (ref 0.40–1.50)
GFR: 63.74 mL/min (ref >60.00)
Glucose, Bld: 106 mg/dL — ABNORMAL HIGH (ref 70–99)
Potassium: 4.9 mEq/L (ref 3.5–5.1)
Sodium: 140 mEq/L (ref 135–145)
Total Bilirubin: 0.5 mg/dL (ref 0.2–1.2)
Total Protein: 6.5 g/dL (ref 6.0–8.3)

## 2019-11-23 LAB — TROPONIN I (HIGH SENSITIVITY)
Troponin I (High Sensitivity): 20 ng/L — ABNORMAL HIGH (ref <18)
Troponin I (High Sensitivity): 26 ng/L — ABNORMAL HIGH (ref <18)

## 2019-11-23 LAB — D-DIMER, QUANTITATIVE: D-Dimer, Quant: 1.53 mcg/mL FEU — ABNORMAL HIGH (ref <0.50)

## 2019-11-23 MED ORDER — SODIUM CHLORIDE 0.9% FLUSH: 3.0000 mL | Freq: Once | INTRAVENOUS | Status: DC

## 2019-11-23 MED ORDER — IOHEXOL 350 MG/ML SOLN
100.0000 mL | Freq: Once | INTRAVENOUS | Status: AC | PRN
  Administered 2019-11-23: 100 mL via INTRAVENOUS

## 2019-11-23 NOTE — ED Notes (Signed)
Discharge instructions reviewed with pt. Pt verbalized understanding.   

## 2019-11-23 NOTE — Patient Instructions (Addendum)
You were seen today by John Rinne, NP  for:   I am glad you came into our office today John [name].  I am very concerned regarding your breathing.  We will do lab work today.  Your chest x-ray is clear.  I have concerns that you have had significant worsening to your breathing and I believe that you need to stop smoking immediately.  We will be sending you home on oxygen.  We are still working on how you will be able to have long-term coverage of oxygen.  The DME company will be [name].  Please keep close follow-up with our office.  We will follow up with you after we receive your lab results.  Please also contact cardiology regarding your lower extremity swelling.  We will coordinate a close follow-up with our clinical pharmacy team or we can have an appointment focusing on you stopping smoking as well as reapplication for Bevespi.  It was nice meeting you,  John [name]  1. Shortness of breath  - Pro b natriuretic peptide (BNP); Future - D-Dimer, Quantitative; Future - CBC with Differential/Platelet; Future - Comp Met (CMET); Future  Walk today in office    2. COPD mixed type ([place])  - Pulmonary function test; Future  [name] inhaler >>>2 puffs daily twice a day (4 puffs total daily) >>>This is not a rescue inhaler >>>You take this daily no matter what  Only use your albuterol as a rescue medication to be used if you can't catch your breath by resting or doing a relaxed purse lip breathing pattern.  - The less you use it, the better it will work when you need it. - Ok to use up to 2 puffs  every 4 hours if you must but call for immediate appointment if use goes up over your usual need - Don't leave home without it !!  (think of it like the spare tire for your car)   Note your daily symptoms > remember "red flags" for COPD:   >>>Increase in cough >>>increase in sputum production >>>increase in shortness of breath or activity  intolerance.   If you notice these  symptoms, please call the office to be seen.   It is very important we get you to stop smoking.  I will schedule close follow-up with the clinical pharmacy team for them to review smoking cessation with you as well as to follow-up with you regarding your inhaler coverage.  3. Chronic Respiratory Failure   Continue oxygen therapy as prescribed - 2L  >>>maintain oxygen saturations greater than 88 percent  >>>if unable to maintain oxygen saturations please contact the office  >>>do not smoke with oxygen  >>>can use nasal saline gel or nasal saline rinses to moisturize nose if oxygen causes dryness  Walk today in office  [facility] is now called [name] >>>Contact number for them is [name] [phone] or [phone] ext [phone]    4. Medication management  [place] paperwork provided to you today  I would recommend filling out  We will coordinate follow-up with the clinical pharmacy team in our office for continued monitoring of this  Disability paperwork for your taxes needs to be routed through:  [name] [address] [name], [name] Telephone [phone]   We recommend today:  Orders Placed This Encounter  Procedures  . Pro b natriuretic peptide (BNP)    Standing Status:   Future    Standing Expiration  Date:   11/22/2020  . D-Dimer, Quantitative    Standing Status:   Future    Standing Expiration Date:   11/22/2020  . CBC with Differential/Platelet    Standing Status:   Future    Standing Expiration Date:   11/22/2020  . Comp Met (CMET)    Standing Status:   Future    Standing Expiration Date:   11/22/2020  . Pulmonary function test    Standing Status:   Future    Standing Expiration Date:   11/22/2020    Order Specific Question:   Where should this test be performed?    Answer:   [facility]    Order Specific Question:   Full PFT: includes the following: basic spirometry, spirometry pre & post  bronchodilator, diffusion capacity (DLCO), lung volumes    Answer:   Full PFT   Orders Placed This Encounter  Procedures  . Pro b natriuretic peptide (BNP)  . D-Dimer, Quantitative  . CBC with Differential/Platelet  . Comp Met (CMET)  . Pulmonary function test   No orders of the defined types were placed in this encounter.   Follow Up:    Return in about 1 week (around 11/30/2019), or if symptoms worsen or fail to improve, for Follow up with John [name].  Please present to our office in 1 week or less for an appointment with the clinical pharmacy team for:  . Smoking cessation . Medication Management  . Medication reconciliation  . Medication Access  . Inhaler teaching - HFA   Please do your part to reduce the spread of COVID-19:      Reduce your risk of any infection  and COVID19 by using the similar precautions used for avoiding the common cold or flu:  Marland Kitchen Wash your hands often with soap and warm water for at least 20 seconds.  If soap and water are not readily available, use an alcohol-based hand sanitizer with at least 60% alcohol.  . If coughing or sneezing, cover your mouth and nose by coughing or sneezing into the elbow areas of your shirt or coat, into a tissue or into your sleeve (not your hands). [name] A MASK when in public  . Avoid shaking hands with others and consider head nods or verbal greetings only. . Avoid touching your eyes, nose, or mouth with unwashed hands.  . Avoid close contact with people who are sick. . Avoid places or events with large numbers of people in one location, like concerts or sporting events. . If you have some symptoms but not all symptoms, continue to monitor at home and seek medical attention if your symptoms worsen. . If you are having a medical emergency, call 911.   [place] / e-Visit: [email or web address]         [facility]:  [facility]: [phone]                   [facility]: [phone]     It is flu season:   >>> Best ways to protect herself from the flu: Receive the yearly flu vaccine, practice good hand hygiene washing with soap and also using hand sanitizer when available, eat a nutritious meals, get adequate rest, hydrate appropriately   Please contact the office if your symptoms worsen or you have concerns that you are not improving.   Thank you for choosing [facility] for your healthcare,  and for allowing [place] to partner with you on your healthcare journey. I am thankful to be able to provide care to you today.   Wyn Quaker FNP-C     Chronic Obstructive Pulmonary Disease Chronic obstructive pulmonary disease (COPD) is a long-term (chronic) lung problem. When you have COPD, it is hard for air to get in and out of your lungs. Usually the condition gets worse over time, and your lungs will never return to normal. There are things you can do to keep yourself as healthy as possible.  Your doctor may treat your condition with: ? Medicines. ? Oxygen. ? Lung surgery.  Your doctor may also recommend: ? Rehabilitation. This includes steps to make your body work better. It may involve a team of specialists. ? Quitting smoking, if you smoke. ? Exercise and changes to your diet. ? Comfort measures (palliative care). Follow these instructions at home: Medicines  Take over-the-counter and prescription medicines only as told by your doctor.  Talk to your doctor before taking any cough or allergy medicines. You may need to avoid medicines that cause your lungs to be dry. Lifestyle  If you smoke, stop. Smoking makes the problem worse. If you need help quitting, ask your doctor.  Avoid being around things that make your breathing worse. This may include smoke, chemicals, and fumes.  Stay active, but remember to rest as well.  Learn and  use tips on how to relax.  Make sure you get enough sleep. Most adults need at least 7 hours of sleep every night.  Eat healthy foods. Eat smaller meals more often. Rest before meals. Controlled breathing Learn and use tips on how to control your breathing as told by your doctor. Try:  Breathing in (inhaling) through your nose for 1 second. Then, pucker your lips and breath out (exhale) through your lips for 2 seconds.  Putting one hand on your belly (abdomen). Breathe in slowly through your nose for 1 second. Your hand on your belly should move out. Pucker your lips and breathe out slowly through your lips. Your hand on your belly should move in as you breathe out.  Controlled coughing Learn and use controlled coughing to clear mucus from your lungs. Follow these steps: 1. Lean your head a little forward. 2. Breathe in deeply. 3. Try to hold your breath for 3 seconds. 4. Keep your mouth slightly open while coughing 2 times. 5. Spit any mucus out into a tissue. 6. Rest and do the steps again 1 or 2 times as needed. General instructions  Make sure you get all the shots (vaccines) that your doctor recommends. Ask your doctor about a flu shot and a pneumonia shot.  Use oxygen therapy and pulmonary rehabilitation if told by your doctor. If you need home oxygen therapy, ask your doctor if you should buy a tool to measure your oxygen level (oximeter).  Make a COPD action plan with your doctor. This helps you to know what to do if you feel worse than usual.  Manage any other conditions you have as told by your doctor.  Avoid going outside when it is very hot, cold, or humid.  Avoid people who have a sickness you can catch (contagious).  Keep all follow-up visits as told by your doctor. This is important. Contact a doctor if:  You cough up more mucus than usual.  There is a change in the color or thickness of the mucus.  It is harder to breathe than usual.  Your  breathing is faster  than usual.  You have trouble sleeping.  You need to use your medicines more often than usual.  You have trouble doing your normal activities such as getting dressed or walking around the house. Get help right away if:  You have shortness of breath while resting.  You have shortness of breath that stops you from: ? Being able to talk. ? Doing normal activities.  Your chest hurts for longer than 5 minutes.  Your skin color is more blue than usual.  Your pulse oximeter shows that you have low oxygen for longer than 5 minutes.  You have a fever.  You feel too tired to breathe normally. Summary  Chronic obstructive pulmonary disease (COPD) is a long-term lung problem.  The way your lungs work will never return to normal. Usually the condition gets worse over time. There are things you can do to keep yourself as healthy as possible.  Take over-the-counter and prescription medicines only as told by your doctor.  If you smoke, stop. Smoking makes the problem worse. This information is not intended to replace advice given to you by your health care provider. Make sure you discuss any questions you have with your health care provider. Document Revised: 08/22/2017 Document Reviewed: 10/14/2016 Elsevier Patient Education  [place] Oxygen Use, Adult When a medical condition keeps you from getting enough oxygen, your health care provider may instruct you to take extra oxygen at home. Your health care provider will let you know:  When to take oxygen.  For how long to take oxygen.  How quickly oxygen should be delivered (flow rate), in liters per minute (LPM or L/M). Home oxygen can be given through:  A mask.  A nasal cannula. This is a device or tube that goes in the nostrils.  A transtracheal catheter. This is a small, flexible tube placed in the trachea.  A tracheostomy. This is a surgically made opening in the trachea. These devices are connected with  tubing to an oxygen source, such as:  A tank. Tanks hold oxygen in gas form. They must be replaced when the oxygen is used up.  A liquid oxygen device. This holds oxygen in liquid form. It must be replaced when the oxygen is used up.  An oxygen concentrator machine. This filters oxygen in the room. It uses electricity, so you must have a backup cylinder of oxygen in case the power goes out. Supplies needed: To use oxygen, you will need:  A mask, nasal cannula, transtracheal catheter, or tracheostomy.  An oxygen tank, a liquid oxygen device, or an oxygen concentrator.  The tape that your health care provider recommends (optional). If you use a transtracheal catheter and your prescribed flow rate is 1 LPM or greater, you will also need a humidifier. Risks and complications  Fire. This can happen if the oxygen is exposed to a heat source, flame, or spark.  Injury to skin. This can happen if liquid oxygen touches your skin.  Organ damage. This can happen if you get too little oxygen. How to use oxygen Your health care provider or a representative from your [place] will show you how to use your oxygen device. Follow her or his instructions. The instructions may look something like this: 1. Wash your hands. 2. If you use an oxygen concentrator, make sure it is plugged in. 3. Place one end of the tube into the port on the tank, device, or machine. 4. Place  the mask over your nose and mouth. Or, place the nasal cannula and secure it with tape if instructed. If you use a tracheostomy or transtracheal catheter, connect it to the oxygen source as directed. 5. Make sure the liter-flow setting on the machine is at the level prescribed by your health care provider. 6. Turn on the machine or adjust the knob on the tank or device to the correct liter-flow setting. 7. When you are done, turn off and unplug the machine, or turn the knob to OFF. How to clean and care for the oxygen  supplies Nasal cannula  Clean it with a warm, wet cloth daily or as needed.  Wash it with a liquid soap once a week.  Rinse it thoroughly once or twice a week.  Replace it every 2-4 weeks.  If you have an infection, such as a cold or pneumonia, change the cannula when you get better. Mask  Replace it every 2-4 weeks.  If you have an infection, such as a cold or pneumonia, change the mask when you get better. Humidifier bottle  Wash the bottle between each refill: ? Wash it with soap and warm water. ? Rinse it thoroughly. ? Disinfect it and its top. ? Air-dry it.  Make sure it is dry before you refill it. Oxygen concentrator  Clean the air filter at least twice a week according to directions from your home medical equipment and service company.  Wipe down the cabinet every day. To do this: ? Unplug the unit. ? Wipe down the cabinet with a damp cloth. ? Dry the cabinet. Other equipment  Change any extra tubing every 1-3 months.  Follow instructions from your health care provider about taking care of any other equipment. Safety tips Fire safety tips   Keep your oxygen and oxygen supplies at least 5 ft away from sources of heat, flames, and sparks at all times.  Do not allow smoking near your oxygen. Put up "no smoking" signs in your home. Avoid smoking areas when in public.  Do not use materials that can burn (are flammable) while you use oxygen.  When you go to a restaurant with portable oxygen, ask to be seated in the nonsmoking section.  Keep a Data processing manager close by. Let your fire department know that you have oxygen in your home.  Test your home smoke detectors regularly. Traveling  Secure your oxygen tank in the vehicle so that it does not move around. Follow instructions from your medical device company about how to safely secure your tank.  Make sure you have enough oxygen for the amount of time you will be away from home.  If you are planning air  travel, contact the airline to find out if they allow the use of an approved portable oxygen concentrator. You may also need documents from your health care provider and medical device company before you travel. General safety tips  If you use an oxygen cylinder, make sure it is in a stand or secured to an object that will not move (fixed object).  If you use liquid oxygen, make sure its container is kept upright.  If you use an oxygen concentrator: ? Dance movement psychotherapist company. Make sure you are given priority service in the event that your power goes out. ? Avoid using extension cords, if possible. Follow these instructions at home:  Use oxygen only as told by your health care provider.  Do not use alcohol or other drugs that make you relax (  sedating drugs) unless instructed. They can slow down your breathing rate and make it hard to get in enough oxygen.  Know how and when to order a refill of oxygen.  Always keep a spare tank of oxygen. Plan ahead for holidays when you may not be able to get a prescription filled.  Use water-based lubricants on your lips or nostrils. Do not use oil-based products like petroleum jelly.  To prevent skin irritation on your cheeks or behind your ears, tuck some gauze under the tubing. Contact a health care provider if:  You get headaches often.  You have shortness of breath.  You have a lasting cough.  You have anxiety.  You are sleepy all the time.  You develop an illness that affects your breathing.  You cannot exercise at your regular level.  You are restless.  You have difficult or irregular breathing, and it is getting worse.  You have a fever.  You have persistent redness under your nose. Get help right away if:  You are confused.  You have blue lips or fingernails.  You are struggling to breathe. Summary  Your health care provider or a representative from your [place] will show you how to use your oxygen  device. Follow her or his instructions.  If you use an oxygen concentrator, make sure it is plugged in.  Make sure the liter-flow setting on the machine is at the level prescribed by your health care provider.  Keep your oxygen and oxygen supplies at least 5 ft away from sources of heat, flames, and sparks at all times. This information is not intended to replace advice given to you by your health care provider. Make sure you discuss any questions you have with your health care provider. Document Revised: 02/26/2018 Document Reviewed: 04/02/2016 Elsevier Patient Education  [place].

## 2019-11-23 NOTE — Assessment & Plan Note (Signed)
Currently receives Bevespi through [facility] and me Patient reports that he needs to reapply He has no insurance coverage He is looking for assistance from our office to help with completing this application  Plan: Patient to complete [place] application We will schedule clinical pharmacy team follow-up with our office to ensure patient's application gets resubmitted

## 2019-11-23 NOTE — Discharge Instructions (Addendum)
Return for worsening shortness of breath.  Use your albuterol inhaler every 4 hours(6 puffs) while awake, return for sudden worsening shortness of breath, or if you need to use your inhaler more often.

## 2019-11-23 NOTE — ED Triage Notes (Signed)
Pt arrives POV from MD office for eval of elevated D-Dimer resulted today. Pt reports that he went toPCP today for worsening SOB and he was started on home O2 at that time. Called w/ labs from MD who advised him to present here.

## 2019-11-23 NOTE — ED Provider Notes (Signed)
[facility] EMERGENCY DEPARTMENT Provider Note   CSN: [id number] Arrival date & time: 11/23/19  1723     History Chief Complaint  Patient presents with  . Shortness of Breath  . Elevated D-Dimer    John [name] is a 64 y.o. male.  64 yo M with a chief complaints of shortness of breath.  This is been a chronic issue for him going on for years.  Worsening over the past few months.  Was told he needed oxygen at home sometime ago but declined initially.  Went into the pulmonology clinic today and was noted to be 64% on room air.  Improved significantly with 2 L.  He was able to ambulate there in the office and they were able to acquire oxygen for him at home.  Patient denies significant cough denies fever denies chest pain or discomfort.  Had a positive D-dimer and was sent here for further evaluation.  The history is provided by the patient.  Shortness of Breath Severity:  Moderate Onset quality:  Gradual Duration:  24 months Timing:  Constant Progression:  Worsening Chronicity:  New Relieved by:  Nothing Worsened by:  Nothing Ineffective treatments:  None tried Associated symptoms: no abdominal pain, no chest pain, no fever, no headaches, no rash and no vomiting        Past Medical History:  Diagnosis Date  . Alcohol abuse 12/27/2010  . Allergic rhinitis, cause unspecified 12/27/2010  . Anal warts 04/11/2012  . CAD, NATIVE VESSEL 10/04/2010  . COPD (chronic obstructive pulmonary disease) (HCC)    "a touch" (09/28/2013)  . History of blood transfusion 03/2012   related to nose bleed  . HYPERLIPIDEMIA-MIXED 10/04/2010  . HYPERTENSION, BENIGN 10/04/2010  . Myocardial infarction (HCC) 09/16/2010  . Pneumonia    "when I was a kid"  . TOBACCO ABUSE 10/04/2010    Patient Active Problem List   Diagnosis Date Noted  . Chronic respiratory failure with hypoxia (HCC) 11/23/2019  . Medication management 11/16/2019  . Sleeping difficulties 11/16/2019  . Emphysema  lung (HCC) 12/26/2018  . Exercise hypoxemia 12/26/2018  . Unstable angina (HCC) 09/27/2014  . Anal warts 04/11/2012  . Stented coronary artery   . Hemorrhagic shock (HCC) 03/30/2012  . Epistaxis 03/30/2012  . Dyspnea 01/02/2011  . Preventative health care 12/27/2010  . Alcohol abuse 12/27/2010  . Allergic rhinitis, cause unspecified 12/27/2010  . HYPERLIPIDEMIA-MIXED 10/04/2010  . Cigarette smoker 10/04/2010  . HYPERTENSION, BENIGN 10/04/2010  . CAD, NATIVE VESSEL 10/04/2010    Past Surgical History:  Procedure Laterality Date  . CARDIAC CATHETERIZATION  ~ 2012  . CORONARY ANGIOPLASTY WITH STENT PLACEMENT  09/17/2010; 09/28/2014   "1; 2"  . FINGER SURGERY Left    "almost cut off" tip of 2nd digit  . LEFT HEART CATHETERIZATION WITH CORONARY ANGIOGRAM N/A 09/28/2014   Procedure: LEFT HEART CATHETERIZATION WITH CORONARY ANGIOGRAM;  Surgeon: Kathleene Hazel, MD;  Location: [facility] CATH LAB;  Service: Cardiovascular;  Laterality: N/A;  . NASAL ENDOSCOPY WITH EPISTAXIS CONTROL Bilateral 03/2012  . VASECTOMY         Family History  Problem Relation Age of Onset  . Brain cancer Mother   . Heart disease Father     Social History   Tobacco Use  . Smoking status: Current Every Day Smoker    Packs/day: 2.00    Years: 50.00    Pack years: 100.00    Types: Cigarettes  . Smokeless tobacco: Never Used  . Tobacco comment:  2 plus packs per day. He has a 100 + pack - year history of tobacco abuse currently. Former 4 ppd for 25 years.  Substance Use Topics  . Alcohol use: Yes    Alcohol/week: 144.0 standard drinks    Types: 144 Cans of beer per week    Comment: 09/28/2014 "12 pack of beer per night"  . Drug use: No    Home Medications Prior to Admission medications   Medication Sig Start Date End Date Taking? Authorizing Provider  aspirin EC 81 MG tablet Take 81 mg by mouth daily.    [provider]  atorvastatin (LIPITOR) 40 MG tablet Take 1 tablet (40 mg total) by mouth  daily. 04/15/19 11/23/19  Kathleene Hazel, MD  azithromycin (ZITHROMAX) 250 MG tablet 500mg  (two tablets) today, then 250mg  (1 tablet) for the next 4 days 11/16/19   , NP  furosemide (LASIX) 40 MG tablet Take 1 tablet (40 mg total) by mouth daily. 12/23/18   Coral Ceo, MD  Glycopyrrolate-Formoterol ([name]) 9-4.8 MCG/ACT AERO Inhale 2 puffs into the lungs 2 (two) times daily. 03/12/19   Kathleene Hazel, MD  Multiple Vitamin (MULTIVITAMIN WITH MINERALS) TABS tablet Take 1 tablet by mouth daily.    [provider]  nitroGLYCERIN (NITROSTAT) 0.4 MG SL tablet Place 1 tablet (0.4 mg total) under the tongue every 5 (five) minutes as needed for chest pain. 11/16/18   Nyoka Cowden, MD  predniSONE (DELTASONE) 10 MG tablet 4 tabs for 2 days, then 3 tabs for 2 days, 2 tabs for 2 days, then 1 tab for 2 days, then stop 11/16/19   Kathleene Hazel, NP    Allergies    Penicillins  Review of Systems   Review of Systems  Constitutional: Negative for chills and fever.  HENT: Negative for congestion and facial swelling.   Eyes: Negative for discharge and visual disturbance.  Respiratory: Positive for shortness of breath.   Cardiovascular: Negative for chest pain and palpitations.  Gastrointestinal: Negative for abdominal pain, diarrhea and vomiting.  Musculoskeletal: Negative for arthralgias and myalgias.  Skin: Negative for color change and rash.  Neurological: Negative for tremors, syncope and headaches.  Psychiatric/Behavioral: Negative for confusion and dysphoric mood.    Physical Exam Updated Vital Signs BP 129/84   Pulse (!) 102   Temp 98 F (36.7 C) (Oral)   Resp 18   Ht 5\' 11"  (1.803 m)   Wt 68 kg   SpO2 (!) 78%   BMI 20.91 kg/m   Physical Exam Vitals and nursing note reviewed.  Constitutional:      Appearance: He is well-developed.     Comments: Cachectic  HENT:     Head: Normocephalic and atraumatic.  Eyes:     Pupils:  Pupils are equal, round, and reactive to light.  Neck:     Vascular: No JVD.  Cardiovascular:     Rate and Rhythm: Normal rate and regular rhythm.     Heart sounds: No murmur. No friction rub. No gallop.   Pulmonary:     Effort: Tachypnea present. No respiratory distress.     Breath sounds: Decreased breath sounds present. No wheezing.     Comments: Severely diminished breath sounds in all fields Abdominal:     General: There is no distension.     Tenderness: There is no guarding or rebound.  Musculoskeletal:        General: Normal range of motion.     Cervical back:  Normal range of motion and neck supple.  Skin:    Coloration: Skin is not pale.     Findings: No rash.  Neurological:     Mental Status: He is alert and oriented to person, place, and time.  Psychiatric:        Behavior: Behavior normal.     ED Results / Procedures / Treatments   Labs (all labs ordered are listed, but only abnormal results are displayed) Labs Reviewed  BASIC METABOLIC PANEL - Abnormal; Notable for the following components:      Result Value   Chloride 94 (*)    CO2 38 (*)    BUN 28 (*)    All other components within normal limits  CBC - Abnormal; Notable for the following components:   Hemoglobin 17.5 (*)    HCT 54.1 (*)    MCV 105.5 (*)    MCH 34.1 (*)    All other components within normal limits  TROPONIN I (HIGH SENSITIVITY) - Abnormal; Notable for the following components:   Troponin I (High Sensitivity) 20 (*)    All other components within normal limits  TROPONIN I (HIGH SENSITIVITY) - Abnormal; Notable for the following components:   Troponin I (High Sensitivity) 26 (*)    All other components within normal limits    EKG EKG Interpretation  Date/Time:  Tuesday November 23 2019 17:36:52 EST Ventricular Rate:  95 PR Interval:  122 QRS Duration: 76 QT Interval:  330 QTC Calculation: 414 R Axis:   109 Text Interpretation: Normal sinus rhythm Right atrial enlargement Rightward  axis Pulmonary disease pattern Minimal voltage criteria for LVH, may be normal variant ( Cornell product ) Abnormal ECG Otherwise no significant change Confirmed by Melene Plan [phone]) on 11/23/2019 9:18:05 PM   Radiology DG Chest 2 View  Result Date: 11/23/2019 CLINICAL DATA:  Shortness of breath. EXAM: CHEST - 2 VIEW COMPARISON:  Same day. FINDINGS: The heart size and mediastinal contours are within normal limits. Both lungs are clear. Atherosclerosis of thoracic aorta is noted. Hyperexpansion of the lungs is noted. The visualized skeletal structures are unremarkable. IMPRESSION: No active cardiopulmonary disease.  COPD. Aortic Atherosclerosis (ICD10-I70.0). Electronically Signed   By: Lupita Raider M.D.   On: 11/23/2019 18:10   DG Chest 2 View  Result Date: 11/23/2019 CLINICAL DATA:  Follow-up COPD.  Worsening shortness of breath. EXAM: CHEST - 2 VIEW COMPARISON:  03/18/2018 FINDINGS: There is hyperinflation of the lungs compatible with COPD. Heart and mediastinal contours are within normal limits. No focal opacities or effusions. No acute bony abnormality. IMPRESSION: COPD.  No active disease. Electronically Signed   By: Charlett Nose M.D.   On: 11/23/2019 11:11   CT Angio Chest PE W and/or Wo Contrast  Result Date: 11/23/2019 CLINICAL DATA:  Elevated D-dimer.  Dyspnea. EXAM: CT ANGIOGRAPHY CHEST WITH CONTRAST TECHNIQUE: Multidetector CT imaging of the chest was performed using the standard protocol during bolus administration of intravenous contrast. Multiplanar CT image reconstructions and MIPs were obtained to evaluate the vascular anatomy. Automatic exposure control utilized. CONTRAST:  75 mL OMNIPAQUE IOHEXOL 350 MG/ML SOLN without immediate complication. COMPARISON:  None.  Chest radiography earlier on the same day. FINDINGS: Cardiovascular: Optimal opacification of the pulmonary arteries to the segmental level. No evidence of pulmonary embolism. Normal heart size. No pericardial effusion. A 3.2  cm diameter of the pulmonary trunk. Borderline 4.0 cm fusiform tubular dilatation and calcified atherosclerosis of the thoracic aorta. Normal heart size with moderate LAD and  left circumflex coronary calcification. Mediastinum/Nodes: A mildly enlarged nonspecific right hilar noncalcified lymph node measuring 21 x 18 mm. And upper limits of normal 10 mm short axis diameter left paratracheal noncalcified lymph node. Lungs/Pleura: Moderate centrilobular emphysema with thin linear scarring in the lung bases. A 6 mm mixed predominantly sub solid left upper lobe nodule on series 7 image 26 of unknown chronicity. Bilateral upper lobe and left lower lobe subsegmental subpleural nodular consolidation. Mild bilateral bronchitis and bilateral infrahilar mild bronchiectasis. Upper Abdomen: A simple appearing 2 cm right renal midpole cortical cyst. Reflux of contrast material into the hepatic veins. Apparent mild wall thickening of the gastric fundus and greater curvature, which could be secondary to nondistention of the gastric lumen. Musculoskeletal: Mild vertebral body degenerative endplate changes and small intravertebral disc herniations. Mild bone demineralization. Review of the MIP images confirms the above findings. IMPRESSION: No pulmonary embolism.  Pulmonary artery hypertension. Moderate 2-vessel coronary calcification. Aortic Atherosclerosis (ICD10-I70.0) and Emphysema (ICD10-J43.9) with aneurysmal 4.0 cm fusiform dilatation of the tubular/ascending aorta. Recommend semi-annual imaging followup by CTA or MRA and referral to cardiothoracic surgery if not already obtained. This recommendation follows 2010 ACCF/AHA/AATS/ACR/ASA/SCA/SCAI/SIR/STS/SVM Guidelines for the Diagnosis and Management of Patients With Thoracic Aortic Disease. Circulation. 2010; 121: Z610-R604. Aortic aneurysm NOS (ICD10-I71.9) A nonspecific mildly enlarged noncalcified right hilar lymph node. Bilateral mild bronchitis with mild infrahilar  bronchiectasis. An indeterminate 6 mm next left upper lobe pulmonary nodule of unknown chronicity. Follow-up non-contrast CT recommended at 3-6 months to confirm persistence. If unchanged, and solid component remains <6 mm, annual CT is recommended until 5 years of stability has been established. If persistent these nodules should be considered highly suspicious if the solid component of the nodule is 6 mm or greater in size and enlarging. This recommendation follows the consensus statement: Guidelines for Management of Incidental Pulmonary Nodules Detected on CT Images: From the Fleischner Society 2017; Radiology 2017; 284:228-243. Electronically Signed   By: Revonda Humphrey   On: 11/23/2019 19:36    Procedures Procedures (including critical care time)  Medications Ordered in ED Medications  sodium chloride flush (NS) 0.9 % injection 3 mL (has no administration in time range)  iohexol (OMNIPAQUE) 350 MG/ML injection 100 mL (100 mLs Intravenous Contrast Given 11/23/19 1854)    ED Course  I have reviewed the triage vital signs and the nursing notes.  Pertinent labs & imaging results that were available during my care of the patient were reviewed by me and considered in my medical decision making (see chart for details).    MDM Rules/Calculators/A&P                      64 yo M with a chief complaints of shortness of breath.  Patient was sent from the pulmonology clinic because he was found to have an elevated D-dimer.  Hypoxia had gotten significantly worse and was started on 2 L of oxygen in the office.  Patient has severely diminished breath sounds in all fields for me on exam is tachypneic.  I discussed with him the results that we had obtained from the lab and CT imaging thus far.  I offered to admit him to the hospital for beta-2 agonist and steroid therapy which he declined.  I offered to treat him with albuterol and ipratropium here in the department as well as give him a dose of magnesium.   He wanted to hold off on steroid therapy as his pulmonologist was planning on reassessing him  in a week to see if he would benefit that time.  I will have him call his pulmonologist in the morning and let them know how he was doing.  Offered for him to return anytime he changes his mind or if he has any worsening trouble breathing.  10:08 PM:  I have discussed the diagnosis/risks/treatment options with the patient and believe the pt to be eligible for discharge home to follow-up with PCP. We also discussed returning to the ED immediately if new or worsening sx occur. We discussed the sx which are most concerning (e.g., sudden worsening sob, fever, inability to tolerate by mouth) that necessitate immediate return. Medications administered to the patient during their visit and any new prescriptions provided to the patient are listed below.  Medications given during this visit Medications  sodium chloride flush (NS) 0.9 % injection 3 mL (has no administration in time range)  iohexol (OMNIPAQUE) 350 MG/ML injection 100 mL (100 mLs Intravenous Contrast Given 11/23/19 1854)     The patient appears reasonably screen and/or stabilized for discharge and I doubt any other medical condition or other [facility] requiring further screening, evaluation, or treatment in the ED at this time prior to discharge.   Final Clinical Impression(s) / ED Diagnoses Final diagnoses:  COPD exacerbation [facility])    Rx / DC Orders ED Discharge Orders    None       Melene Plan, DO 11/23/19 2208

## 2019-11-23 NOTE — Assessment & Plan Note (Signed)
Continued dyspnea Suspect this is likely multifactorial Given the patient's tachycardia, lower extremity swelling and persistent hypoxemia we will rule out other options Walk today in office patient did not have desaturations while maintained on 2 L continuous  Plan: X-ray today Lab work today Discharge from our office on 2 L of O2  Addendum: D-dimer has come back elevated.  Contacted patient.  Informed that he needs to present to an emergency room for further evaluation.

## 2019-11-23 NOTE — Telephone Encounter (Signed)
Patient and his wife have called back to say that Pulmonary called him in regard to labs from today's visit and instructed pt to go to emergency department because of elevated d-dimer and that he may have a clot in his legs or lungs.  His wife wanted to know what they should do.    I adv them to follow the recommendation and go to ED.  He will be going to [facility] ED.

## 2019-11-23 NOTE — Assessment & Plan Note (Signed)
Current smoker Presenting to office today on room air satting 64% Walk today in office patient requires 2 L with physical exertion and at rest  Plan: Continue Bevespi Will have close clinical pharmacy team follow-up to help patient with reapplication for [place] for me Follow-up with Dr. Sherene Sires in 1 week Lab work Chest x-ray

## 2019-11-23 NOTE — ED Notes (Signed)
Provider bedside, family member on phone.

## 2019-11-23 NOTE — ED Notes (Signed)
77% on RA.  Placed back on the 3L O2 [place].  93% on 3L

## 2019-11-23 NOTE — Telephone Encounter (Signed)
New Message  Pt's wife called and stated that Dr. Clifton James saw him on 11/19/19. Dr. Clifton James wanted him to take the fluid pill furosemide (LASIX) 40 MG tablet 2 times by month daily for three days to see if the swelling would decrease. Wife informed me that the swelling did not decrease and want to know what to do next.  Please call back to discuss.

## 2019-11-23 NOTE — Assessment & Plan Note (Signed)
Previously seen at consult in April/2020 where patient was told he needed oxygen with physical exertion, he declined Persistent exertional hypoxemia, televisit last week Presenting to office today with oxygen saturations of 64% Walk in office patient tolerated oxygen at 2 L, no desaturations  Discussion: A significant amount of work and effort went into trying to help coordinate patient's oxygen.  Patient discharged from our office with oxygen from [name].  Unfortunately patient has no insurance coverage.  Patient reports that he cannot qualify for Medicaid.  He is on disability.  Patient can qualify for Medicare in October/2021 per patient and girlfriend.  This insurance coverage is a significant barrier for the patient.  We explored multiple options both through [facility] as well as to [name].  Unfortunately the patient's options would be he can either pay for oxygen out-of-pocket at $150 a month per [name] or he can see if he can be qualified through an inpatient coverage program at [facility] health, this would require him presenting to the emergency room  Plan: Continue oxygen therapy 2 L 24/7 Lab work today X-ray 1 week follow-up with Dr. Sherene Sires   Addendum: Contacted patient discussed options of coverage for oxygen.  He reports that he will prefer to explore insurance options that he can obtain coverage for.  In the meantime he will be cash pay.  Notified DME company of patient's choice.

## 2019-11-24 ENCOUNTER — Telehealth: Payer: Self-pay | Admitting: Pulmonary Disease

## 2019-11-24 DIAGNOSIS — Z79899 Other long term (current) drug therapy: Secondary | ICD-10-CM

## 2019-11-24 DIAGNOSIS — R609 Edema, unspecified: Secondary | ICD-10-CM

## 2019-11-24 LAB — PRO B NATRIURETIC PEPTIDE: NT-Pro BNP: 3582 pg/mL — ABNORMAL HIGH (ref 0–210)

## 2019-11-24 MED ORDER — NITROGLYCERIN 0.4 MG SL SUBL: 0.4000 mg | SUBLINGUAL_TABLET | SUBLINGUAL | 6 refills | Status: DC | PRN

## 2019-11-24 MED ORDER — FUROSEMIDE 40 MG PO TABS: 40.0000 mg | ORAL_TABLET | Freq: Two times a day (BID) | ORAL | 3 refills | Status: DC

## 2019-11-24 NOTE — Telephone Encounter (Signed)
thanks

## 2019-11-24 NOTE — Telephone Encounter (Signed)
11/24/2019  We will start a new encounter as cardiology team took over management of previous triage message.  I have reviewed emergency room visit note as well as negative CTA chest.  Patient with a significantly elevated proBNP today.  Agreed patient needs to follow-up with cardiology regarding his Lasix dosing.  His cardiologist is Dr. Clifton James.  Patient needs to keep follow-up with our office in the short-term.  Glad he did not have a pulmonary embolism.  We will route to triage for follow-up as this is where the message did originate based off of previous intake call from patient's girlfriend.  Please route proBNP results to cardiology team as FYI.  They should be able to see this in epic.  Elisha Headland, FNP

## 2019-11-24 NOTE — Telephone Encounter (Signed)
I spoke with patient and gave him instructions from Dr Clifton James. He will come in for [facility] on March 10,2021 in the early afternoon. Patient will let us know if no resolution of swelling after taking 3 days of increased furosemide dose.  Will send prescription for furosemide 40 mg twice daily to [facility] on [address] in [place]

## 2019-11-24 NOTE — Telephone Encounter (Signed)
Thank you! Great work! [name]

## 2019-11-24 NOTE — Telephone Encounter (Signed)
I spoke with Eunice Blase and patient. At recent office visit with Dr Clifton James lasix was increased for 3 days due to swelling. Patient took increased dose on Saturday, Sunday and Monday.  There was no change in his swelling. He continues to have swelling in both lower legs.  Goes to about 3 inches above ankles.  He does not weigh daily. Elevated d dimer yesterday at pulmonary visit and was then seen in ED.  Patient reports his shortness of breath has improved after being started on oxygen. Patient will follow up with pulmonary regarding inhaler question. Will forward to Dr Clifton James regarding swelling.

## 2019-11-24 NOTE — Telephone Encounter (Signed)
Patient's wife is following up in regards to swelling. She states the patient has still been experiencing the swelling in his feet. furosemide (LASIX) 40 MG tablet medication increase has not helped to reduce the swelling. Please return call and advise.

## 2019-11-24 NOTE — Telephone Encounter (Signed)
I just saw this. Let's have him increase his Lasix to 80 mg po BID for 3 days then go back to 40 mg po BID if there is resolution. He will need a BMET in one week. Thanks, chris

## 2019-11-24 NOTE — Telephone Encounter (Signed)
Pt is scheduled for an appt with MW 3/16 at 11:30.  Called and spoke with pt's girlfriend Eunice Blase about the message from [place] and told her to make sure they keep the f/u with MW 3/16 and she stated they would. Debbie told me that they received a call from [employer] and they will be delivering pt's O2 to him today. Debbie also stated when pt was at the ED, they had put pt on 4L O2 and pt did tell them while at our office we had him on 2L but they still wanted to keep him on 4L.  Eunice Blase stated that cardiology has increased pt's lasix from 40mg  once a day to 40mg  twice daily and he is scheduled to have labwork performed by cards 3/10. I have routed pt's Pro-BNP labwork to Dr. through Epic but they should also be able to see results in pt's chart.  Routing to [place] as an Clifton James.

## 2019-11-30 ENCOUNTER — Ambulatory Visit: Payer: Self-pay | Admitting: Internal Medicine

## 2019-12-01 ENCOUNTER — Other Ambulatory Visit: Payer: Self-pay

## 2019-12-01 ENCOUNTER — Other Ambulatory Visit: Payer: Self-pay | Admitting: *Deleted

## 2019-12-01 DIAGNOSIS — R609 Edema, unspecified: Secondary | ICD-10-CM

## 2019-12-01 DIAGNOSIS — Z79899 Other long term (current) drug therapy: Secondary | ICD-10-CM

## 2019-12-01 NOTE — Progress Notes (Signed)
Correct BMET order placed at the request of our lab, to reflect resulting agency to [facility] and not [employer] as previously placed.  Pt is having BMET drawn today as ordered by Dr. Clifton James.

## 2019-12-03 NOTE — Progress Notes (Signed)
   Subjective Patient presents to [facility] and seen by the pharmacist for smoking cessation counseling.  Patient states he has been smoking for 50 years and he started when he was 64 years old.  He used to smoke 4 to 5 packs a day when he was younger but has been able to decrease to 1 pack a day.  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  Patient states he understands the importance of quitting smoking but he is not ready to stop at this time.  Social History   Tobacco Use  Smoking Status Current Every Day Smoker  . Packs/day: 2.00  . Years: 50.00  . Pack years: 100.00  . Types: Cigarettes  Smokeless Tobacco Never Used  Tobacco Comment   2 plus packs per day. He has a 100 + pack - year history of tobacco abuse currently. Former 4 ppd for 25 years.     Tobacco Use History  Age when started using tobacco on a daily basis 13.  Type: cigarettes.  Number of cigarettes per day 1 pack, brand sonoma lights.  Smokes first cigarette 30 minutes after waking.  Does not wake at night to smoke  Triggers include nicotine cravings, after meals, stress.  Quit Attempt History   Most recent quit attempt years.  Longest time ever been tobacco free was a day or two..  Methods tried in the past include nicotine patch, cold [place], hyponotisim..   Rates IMPORTANCE of quitting tobacco on 1-10 scale of 9.  Rates READINESS of quitting tobacco on 1-10 scale of 4.  Rates CONFIDENCE of quitting tobacco on 1-10 scale of 3.  Motivators to quitting include getting off oxygen, increase activities; barriers include cravings and just the habit of it.    There is no immunization history on file for this patient.   Assessment and Plan  1. Smoking Cessation   Patient is not ready to quit smoking at this time.  He has been able to be successful in gradually decreasing the amount of cigarettes he smokes.  Reviewed the STAR quit plan strategy to stop smoking  and reviewed nicotine replacement options.  Patient was not interested in any nicotine replacement therapy at this time.    Patient was scheduled an appointment with the pharmacy team in 2 months to reassess readiness to quit smoking. Patient is willing to continue gradually decreasing the amount of cigarettes he smokes.  Patient agreed to go decreasing to half a pack a day by our next visit in 2 months by decreasing 1 cigarette/week.  Provided information on 1 800-QUIT NOW support program for additional support.  2. Inhaler Patient Assistance Patient returned patient assistance application for Bevespi.  Application was placed in provider box.  Advised that the office will update patient when they hear response.  Patient was counseled on the purpose, proper use, and adverse effects of Breztri inhaler.   Demonstrated proper inhaler technique using Breztri demo inhaler.  Patient able to demonstrate proper inhaler technique using teach back method.  All questions encouraged and answered.  Instructed patient to reach out with any questions or concerns.  Thank you for allowing pharmacy to participate in this patient's care.  This appointment required 30 minutes of patient care (this includes precharting, chart review, review of results, face-to-face care, etc.).   [name], PharmD, [place], CPP Clinical Specialty Pharmacist [phone]  12/09/2019 2:58 PM

## 2019-12-06 LAB — BASIC METABOLIC PANEL
BUN/Creatinine Ratio: 20 (ref 10–24)
BUN: 20 mg/dL (ref 8–27)
CO2: 42 mmol/L (ref 20–29)
Calcium: 9.6 mg/dL (ref 8.6–10.2)
Chloride: 91 mmol/L — ABNORMAL LOW (ref 96–106)
Creatinine, Ser: 1 mg/dL (ref 0.76–1.27)
GFR calc Af Amer: 93 mL/min/{1.73_m2} (ref >59)
GFR calc non Af Amer: 80 mL/min/{1.73_m2} (ref >59)
Glucose: 87 mg/dL (ref 65–99)
Potassium: 4.7 mmol/L (ref 3.5–5.2)
Sodium: 142 mmol/L (ref 134–144)

## 2019-12-07 ENCOUNTER — Ambulatory Visit (INDEPENDENT_AMBULATORY_CARE_PROVIDER_SITE_OTHER): Payer: Self-pay | Admitting: Internal Medicine

## 2019-12-07 ENCOUNTER — Other Ambulatory Visit: Payer: Self-pay

## 2019-12-07 ENCOUNTER — Encounter: Payer: Self-pay | Admitting: Internal Medicine

## 2019-12-07 ENCOUNTER — Other Ambulatory Visit (HOSPITAL_COMMUNITY)
Admission: RE | Admit: 2019-12-07 | Discharge: 2019-12-07 | Disposition: A | Discharge status: Home / Self Care | Payer: HRSA Program | Source: Ambulatory Visit | Attending: Pulmonary Disease | Admitting: Pulmonary Disease

## 2019-12-07 DIAGNOSIS — J9612 Chronic respiratory failure with hypercapnia: Secondary | ICD-10-CM

## 2019-12-07 DIAGNOSIS — R0902 Hypoxemia: Secondary | ICD-10-CM

## 2019-12-07 DIAGNOSIS — Z20822 Contact with and (suspected) exposure to covid-19: Secondary | ICD-10-CM | POA: Insufficient documentation

## 2019-12-07 DIAGNOSIS — Z01812 Encounter for preprocedural laboratory examination: Secondary | ICD-10-CM | POA: Insufficient documentation

## 2019-12-07 DIAGNOSIS — J9611 Chronic respiratory failure with hypoxia: Secondary | ICD-10-CM

## 2019-12-07 DIAGNOSIS — F17210 Nicotine dependence, cigarettes, uncomplicated: Secondary | ICD-10-CM

## 2019-12-07 DIAGNOSIS — J449 Chronic obstructive pulmonary disease, unspecified: Secondary | ICD-10-CM

## 2019-12-07 LAB — SARS CORONAVIRUS 2 (TAT 6-24 HRS): SARS Coronavirus 2: NEGATIVE

## 2019-12-07 MED ORDER — BREZTRI AEROSPHERE 160-9-4.8 MCG/ACT IN AERO: 2.0000 | INHALATION_SPRAY | Freq: Two times a day (BID) | RESPIRATORY_TRACT | 0 refills | Status: DC

## 2019-12-07 NOTE — Patient Instructions (Addendum)
Make sure you check your oxygen saturations at highest level of activity to be sure it stays over 90% and adjust upward to maintain this level if needed but remember to turn it back to previous settings when you stop (to conserve your supply).   The key is to stop smoking completely before smoking completely stops you!   Please schedule a follow up visit in 3 months but call sooner if needed

## 2019-12-07 NOTE — Progress Notes (Signed)
John [name], male    DOB: May 05, 1956,     MRN: [id number]   Brief patient profile:  21 yowm active smoker worked at [employer] before then closed, exposed to brake shoes  late80s and progressive doe x 2008 when eval in pulmonary clinic by me (records requested) but never on maint inhalers or 02 so referred to pulmonary clinic 12/25/2018 by Dr   [facility] dept.    Has IHD s/p multiple stents/ follow by [facility]     History of Present Illness  12/25/2018  Pulmonary/ 1st office eval/James Senn  Chief Complaint  Patient presents with  . pulm consult    Pt has increase SOB for last months  Dyspnea:  60 ft from curb to door / slow pace = MMRC3 = can't walk 100 yards even at a slow pace at a flat grade s stopping due to sob   Cough: none Sleep: insomnia but able to lie flat  SABA use: never  rec Bevespi Take 2 puffs first thing in am and then another 2 puffs about 12 hours later.  Work on inhaler technique    12/07/2019  f/u ov/Marybelle Giraldo re GOLD II/ group b  Chief Complaint  Patient presents with  . Follow-up    Breathing has improved since he has started on o2. Spouse is concerned that he is sleeping alot more than usual.   Dyspnea:  60 ft curb to door on 2 lpm  Cough:  None  Sleeping: in recliner about 30 degrees/ wakes up feeling ok / no ha  SABA use: 2 02: 2.5 hs and 2lpm during day   No obvious day to day or daytime variability or assoc excess/ purulent sputum or mucus plugs or hemoptysis or cp or chest tightness, subjective wheeze or overt sinus or hb symptoms.   Sleeping as above without nocturnal  or early am exacerbation  of respiratory  c/o's or need for noct saba. Also denies any obvious fluctuation of symptoms with weather or environmental changes or other aggravating or alleviating factors except as outlined above   No unusual exposure hx or h/o childhood pna/ asthma or knowledge of premature birth.  Current Allergies, Complete Past Medical History, Past  Surgical History, Family History, and Social History were reviewed in [employer] record.  ROS  The following are not active complaints unless bolded Hoarseness, sore throat, dysphagia, dental problems, itching, sneezing,  nasal congestion or discharge of excess mucus or purulent secretions, ear ache,   fever, chills, sweats, unintended wt loss or wt gain, classically pleuritic or exertional cp,  orthopnea pnd or arm/hand swelling  or leg swelling, presyncope, palpitations, abdominal pain, anorexia, nausea, vomiting, diarrhea  or change in bowel habits or change in bladder habits, change in stools or change in urine, dysuria, hematuria,  rash, arthralgias, visual complaints, headache, numbness, weakness or ataxia or problems with walking or coordination,  change in mood or  memory.        Current Meds  Medication Sig  . aspirin EC 81 MG tablet Take 81 mg by mouth daily.  Marland Kitchen atorvastatin (LIPITOR) 40 MG tablet Take 1 tablet (40 mg total) by mouth daily.  . furosemide (LASIX) 40 MG tablet Take 1 tablet (40 mg total) by mouth 2 (two) times daily.  . Glycopyrrolate-Formoterol ([name]) 9-4.8 MCG/ACT AERO Inhale 2 puffs into the lungs 2 (two) times daily.  . Multiple Vitamin (MULTIVITAMIN WITH MINERALS) TABS tablet Take 1 tablet by mouth daily.  Marland Kitchen  nitroGLYCERIN (NITROSTAT) 0.4 MG SL tablet Place 1 tablet (0.4 mg total) under the tongue every 5 (five) minutes as needed for chest pain.            .       Objective:    amb wm nad     Wt Readings from Last 3 Encounters:  12/07/19 66.2 kg  11/23/19 68 kg  11/23/19 68.3 kg      Vital signs reviewed  12/07/2019  - Note at rest 02 sats  93% on 2lpm     HEENT : pt wearing mask not removed for exam due to covid -19 concerns.    NECK :  without JVD/Nodes/TM/ nl carotid upstrokes bilaterally   LUNGS: no acc muscle use,  Mod barrel  contour chest wall with bilateral  Distant bs s audible wheeze and  without  cough on insp or exp maneuvers and mod  Hyperresonant  to  percussion bilaterally     CV:  RRR  no s3 or murmur or increase in P2, and no edema   ABD:  soft and nontender with pos mid insp Hoover's  in the supine position. No bruits or organomegaly appreciated, bowel sounds nl  MS:     ext warm without deformities, calf tenderness, cyanosis or clubbing No obvious joint restrictions   SKIN: warm and dry without lesions    NEURO:  alert, approp, nl sensorium with  no motor or cerebellar deficits apparent.         I personally reviewed images and agree with radiology impression as follows:   Chest CTa 11/23/19 No pulmonary embolism.  Pulmonary artery hypertension          Assessment

## 2019-12-09 ENCOUNTER — Encounter: Payer: Self-pay | Admitting: Internal Medicine

## 2019-12-09 ENCOUNTER — Ambulatory Visit (INDEPENDENT_AMBULATORY_CARE_PROVIDER_SITE_OTHER): Payer: Self-pay | Admitting: Pharmacist

## 2019-12-09 ENCOUNTER — Ambulatory Visit (INDEPENDENT_AMBULATORY_CARE_PROVIDER_SITE_OTHER): Payer: Self-pay | Admitting: Internal Medicine

## 2019-12-09 ENCOUNTER — Other Ambulatory Visit: Payer: Self-pay

## 2019-12-09 DIAGNOSIS — F17210 Nicotine dependence, cigarettes, uncomplicated: Secondary | ICD-10-CM

## 2019-12-09 DIAGNOSIS — Z716 Tobacco abuse counseling: Secondary | ICD-10-CM

## 2019-12-09 DIAGNOSIS — J9611 Chronic respiratory failure with hypoxia: Secondary | ICD-10-CM | POA: Insufficient documentation

## 2019-12-09 DIAGNOSIS — J449 Chronic obstructive pulmonary disease, unspecified: Secondary | ICD-10-CM

## 2019-12-09 LAB — PULMONARY FUNCTION TEST
DL/VA % pred: 31 %
DL/VA: 1.31 ml/min/mmHg/L
DLCO cor % pred: 25 %
DLCO cor: 7.13 ml/min/mmHg
DLCO unc % pred: 27 %
DLCO unc: 7.65 ml/min/mmHg
FEF 25-75 Post: 0.22 L/sec
FEF 25-75 Pre: 0.23 L/sec
FEF2575-%Change-Post: -3 %
FEF2575-%Pred-Post: 7 %
FEF2575-%Pred-Pre: 7 %
FEV1-%Change-Post: 3 %
FEV1-%Pred-Post: 16 %
FEV1-%Pred-Pre: 15 %
FEV1-Post: 0.59 L
FEV1-Pre: 0.58 L
FEV1FVC-%Change-Post: 5 %
FEV1FVC-%Pred-Pre: 45 %
FEV6-%Change-Post: -1 %
FEV6-%Pred-Post: 33 %
FEV6-%Pred-Pre: 33 %
FEV6-Post: 1.54 L
FEV6-Pre: 1.56 L
FEV6FVC-%Change-Post: 1 %
FEV6FVC-%Pred-Post: 100 %
FEV6FVC-%Pred-Pre: 98 %
FVC-%Change-Post: -2 %
FVC-%Pred-Post: 33 %
FVC-%Pred-Pre: 34 %
FVC-Post: 1.62 L
FVC-Pre: 1.67 L
Post FEV1/FVC ratio: 37 %
Post FEV6/FVC ratio: 96 %
Pre FEV1/FVC ratio: 35 %
Pre FEV6/FVC Ratio: 94 %

## 2019-12-09 NOTE — Assessment & Plan Note (Addendum)
Active smoker - PFT's 10/01/06 FEV1 2.23 (60%) ratio 47 with 5% resp to saba and DLCO 102% and classic curvature to f/v loop  - 12/25/2018  After extensive coaching inhaler device,  effectiveness =    75% so try BEVESPI 2bid and if helps then [place]/funds   C/w GOLD II/ Group D in terms of symptom/risk and laba/lama/ICS  therefore appropriate rx at this point >>>  breztri 2 bid if [place] acquire from [place] o/w will use bevespi and work harder on stop smoking   - The proper method of use, as well as anticipated side effects, of a metered-dose inhaler are discussed and demonstrated to the patient. Improved effectiveness after extensive coaching during this visit to a level of approximately 75 % from a baseline of 50 % (short Ti)

## 2019-12-09 NOTE — Patient Instructions (Signed)
   GOAL- is to decrease to 1/2 pack of cigarettes before next appointment  Decrease by 1 cigarette per week

## 2019-12-09 NOTE — Assessment & Plan Note (Signed)
Counseled re importance of smoking cessation but did not meet time criteria for separate billing     I had an extended discussion with the patient and reviewed all relevant studies so total time was 20 minutes with moderate level of MDM.  Each maintenance medication was reviewed in detail including most importantly the difference between maintenance and prns and under what circumstances the prns are to be triggered using an action plan format that is not reflected in the computer generated alphabetically organized AVS.     Please see AVS for specific instructions unique to this visit that I personally wrote and verbalized to the the pt in detail and then reviewed with pt  by my nurse highlighting any  changes in therapy recommended at today's visit to their plan of care.   

## 2019-12-09 NOTE — Progress Notes (Signed)
PFT done today. 

## 2019-12-09 NOTE — Assessment & Plan Note (Addendum)
HC03  12/11/19  = 42   Though somewhat paradoxic, when the lung fails to clear C02 properly and pC02 rises the lung then becomes a more efficient scavenger of C02 allowing lower work of breathing and  better C02 clearance albeit at a higher serum pC02 level - this is why pts can look a lot better than their ABG's would suggest and why it's so difficult to prognosticate endstage dz.  It's also why I strongly rec DNI status (ventilating pts down to a nl pC02 adversely affects this compensatory mechanism)   Goals is to keep sats in low 90s/ reviewedl.

## 2019-12-15 ENCOUNTER — Telehealth: Payer: Self-pay | Admitting: Internal Medicine

## 2019-12-15 NOTE — Telephone Encounter (Signed)
Spoke with patient. He was calling to check on the status of the [place] Paperwork from last week. I advised him that the paperwork was faxed last week and I received a confirmation note. I will have call [place] in the morning to follow up on this. Routing message to myself for follow up.

## 2019-12-17 NOTE — Telephone Encounter (Signed)
Called the [place] program and spoke with Dawn. She stated that the patient has been approved for Breztri from 12/13/2019 to 12/07/2020. The first shipment was sent on 12/13/19 with a scheduled delivery date of 12/21/19.   Spoke with patient. He is aware of the approval and shipment date. Verbalized understanding. Nothing further needed at time of call.

## 2020-02-07 ENCOUNTER — Other Ambulatory Visit: Payer: Self-pay

## 2020-02-07 ENCOUNTER — Ambulatory Visit (INDEPENDENT_AMBULATORY_CARE_PROVIDER_SITE_OTHER): Payer: Self-pay | Admitting: Pharmacist

## 2020-02-07 DIAGNOSIS — J449 Chronic obstructive pulmonary disease, unspecified: Secondary | ICD-10-CM

## 2020-02-07 MED ORDER — BEVESPI AEROSPHERE 9-4.8 MCG/ACT IN AERO: 2.0000 | INHALATION_SPRAY | Freq: Two times a day (BID) | RESPIRATORY_TRACT | 11 refills | Status: DC

## 2020-02-07 NOTE — Patient Instructions (Signed)
   GOAL: Decrease by 1 cigarette daily every 2 weeks  Try placing cigarettes in another area of the house instead of shirt pocket to deter you from smoking  Encourage you to select alternative healthy activities to try when you are smoking a cigarette e.g. drinking a glass water, walking in garage with vintage cars, researching vintage cars  Will call for follow-up in 2 months to assess progress

## 2020-02-07 NOTE — Progress Notes (Signed)
   Subjective Patient presents to [facility] and seen by the pharmacist for smoking cessation counseling.  He is smoking a pack a day but some days a few less. He was not able to meet his goal of decreasing by 1/2 pack a day.  Social History   Tobacco Use  Smoking Status Current Every Day Smoker  . Packs/day: 2.00  . Years: 50.00  . Pack years: 100.00  . Types: Cigarettes  Smokeless Tobacco Never Used  Tobacco Comment   2 plus packs per day. He has a 100 + pack - year history of tobacco abuse currently. Former 4 ppd for 25 years.     Assessment and Plan  1. Smoking Cessation   Patient is not ready to quit smoking.  He is not confident in his ability to quit smoking due to a 50 year history.  He states he made small progress in his attempt to decrease and is now more cognizant of the actual amount he is smoking.  He would like to set a new more realistic goal of gradually decreasing.    Patient set new goal of decreasing by 1 cigarette daily every 2 weeks.  He plans to stop keeping cigarettes in his shirt pocket so he will have to decide if he wants to really get up when he has a craving.  Patient also picked alternative healthy habits to try when he gets a craving including walking to the garage to be around his vintage cars.    Patient requested 2 month follow-up call.  2. Medication Reconciliation A drug regimen assessment was performed, including review of allergies, interactions, disease-state management, dosing and immunization history. Medications were reviewed with the patient, including name, instructions, indication, goals of therapy, potential side effects, importance of adherence, and safe use.    Patient medication list included John [name] but he is actually on John [name]. Updated list to reflect what patient is actually taking.  All questions encouraged and answered.  Instructed patient to call with any further questions or concerns.  Thank you for allowing pharmacy to  participate in this patient's care.  This appointment required 25 minutes of patient care (this includes precharting, chart review, review of results, face-to-face care, etc.).   John [name], PharmD, [place], CPP Clinical Specialty Pharmacist [phone]  02/07/2020 2:49 PM

## 2020-02-22 ENCOUNTER — Telehealth: Payer: Self-pay | Admitting: Internal Medicine

## 2020-02-22 MED ORDER — BREZTRI AEROSPHERE 160-9-4.8 MCG/ACT IN AERO: 2.0000 | INHALATION_SPRAY | Freq: Two times a day (BID) | RESPIRATORY_TRACT | Status: DC

## 2020-02-22 NOTE — Telephone Encounter (Signed)
Per 12/09/19 office visit notes we switched pt from bevespi to breztri.  Spoke with pt, aware of recs.  Nothing further needed at this time- will close encounter.

## 2020-03-08 ENCOUNTER — Ambulatory Visit: Payer: Self-pay | Admitting: Internal Medicine

## 2020-03-09 ENCOUNTER — Encounter: Payer: Self-pay | Admitting: Internal Medicine

## 2020-03-09 ENCOUNTER — Other Ambulatory Visit (HOSPITAL_COMMUNITY)
Admission: RE | Admit: 2020-03-09 | Discharge: 2020-03-09 | Disposition: A | Discharge status: Home / Self Care | Payer: Self-pay | Source: Ambulatory Visit | Attending: Internal Medicine | Admitting: Internal Medicine

## 2020-03-09 ENCOUNTER — Ambulatory Visit (INDEPENDENT_AMBULATORY_CARE_PROVIDER_SITE_OTHER): Payer: Self-pay | Admitting: Internal Medicine

## 2020-03-09 ENCOUNTER — Other Ambulatory Visit: Payer: Self-pay

## 2020-03-09 DIAGNOSIS — J9611 Chronic respiratory failure with hypoxia: Secondary | ICD-10-CM

## 2020-03-09 DIAGNOSIS — J449 Chronic obstructive pulmonary disease, unspecified: Secondary | ICD-10-CM | POA: Insufficient documentation

## 2020-03-09 DIAGNOSIS — J9612 Chronic respiratory failure with hypercapnia: Secondary | ICD-10-CM

## 2020-03-09 DIAGNOSIS — F17210 Nicotine dependence, cigarettes, uncomplicated: Secondary | ICD-10-CM

## 2020-03-09 LAB — CBC WITH DIFFERENTIAL/PLATELET
Abs Immature Granulocytes: 0.01 10*3/uL (ref 0.00–0.07)
Basophils Absolute: 0.1 10*3/uL (ref 0.0–0.1)
Basophils Relative: 1 %
Eosinophils Absolute: 0.1 10*3/uL (ref 0.0–0.5)
Eosinophils Relative: 2 %
HCT: 42.6 % (ref 39.0–52.0)
Hemoglobin: 13.5 g/dL (ref 13.0–17.0)
Immature Granulocytes: 0 %
Lymphocytes Relative: 11 %
Lymphs Abs: 0.9 10*3/uL (ref 0.7–4.0)
MCH: 33.3 pg (ref 26.0–34.0)
MCHC: 31.7 g/dL (ref 30.0–36.0)
MCV: 105.2 fL — ABNORMAL HIGH (ref 80.0–100.0)
Monocytes Absolute: 0.7 10*3/uL (ref 0.1–1.0)
Monocytes Relative: 9 %
Neutro Abs: 5.9 10*3/uL (ref 1.7–7.7)
Neutrophils Relative %: 77 %
Platelets: 161 10*3/uL (ref 150–400)
RBC: 4.05 MIL/uL — ABNORMAL LOW (ref 4.22–5.81)
RDW: 14.3 % (ref 11.5–15.5)
WBC: 7.6 10*3/uL (ref 4.0–10.5)
nRBC: 0 % (ref 0.0–0.2)

## 2020-03-09 LAB — BASIC METABOLIC PANEL
Anion gap: 8 (ref 5–15)
BUN: 17 mg/dL (ref 8–23)
CO2: 46 mmol/L — ABNORMAL HIGH (ref 22–32)
Calcium: 9.8 mg/dL (ref 8.9–10.3)
Chloride: 86 mmol/L — ABNORMAL LOW (ref 98–111)
Creatinine, Ser: 0.97 mg/dL (ref 0.61–1.24)
GFR calc Af Amer: >60 mL/min (ref >60)
GFR calc non Af Amer: >60 mL/min (ref >60)
Glucose, Bld: 120 mg/dL — ABNORMAL HIGH (ref 70–99)
Potassium: 4.2 mmol/L (ref 3.5–5.1)
Sodium: 140 mmol/L (ref 135–145)

## 2020-03-09 MED ORDER — ALBUTEROL SULFATE HFA 108 (90 BASE) MCG/ACT IN AERS: INHALATION_SPRAY | RESPIRATORY_TRACT | 11 refills | Status: DC

## 2020-03-09 NOTE — Assessment & Plan Note (Addendum)
Active smoker - PFT's 10/01/06 FEV1 2.23 (60%) ratio 47 with 5% resp to saba and DLCO 102% and classic curvature to f/v loop  - 12/25/2018  After extensive coaching inhaler device,  effectiveness =    75% so try BEVESPI 2bid changed to breztri by pharmacy due to access  - Labs ordered 03/09/2020  :    alpha one AT phenotype     Group D in terms of symptom/risk and laba/lama/ICS  therefore appropriate rx at this point >>>  Continue breztri 2bid   F/u q 3 m sooner prn

## 2020-03-09 NOTE — Assessment & Plan Note (Signed)
Counseled re importance of smoking cessation but did not meet time criteria for separate billing    Suggested e-cigs as an optional  "one way bridge"  Off all tobacco products           Each maintenance medication was reviewed in detail including emphasizing most importantly the difference between maintenance and prns and under what circumstances the prns are to be triggered using an action plan format where appropriate.  Total time for H and P, chart review, counseling, reviewing devices and generating customized AVS unique to this office visit / charting = 20 min

## 2020-03-09 NOTE — Assessment & Plan Note (Signed)
HC03  12/11/19  = 42    As of 03/09/2020  = 3lpm 24/7

## 2020-03-09 NOTE — Progress Notes (Signed)
John [name], male    DOB: 1956/02/08,     MRN: [id number]   Brief patient profile:  58 yowm active smoker worked at [employer] before then closed, exposed to brake shoes  Late 80s and progressive doe x 2008 when eval in pulmonary clinic by me (records requested) but never on maint inhalers or 02 so referred to pulmonary clinic 12/25/2018 by Dr   [facility] dept.    Has IHD s/p multiple stents/ follow by [facility]     History of Present Illness  12/25/2018  Pulmonary/ 1st office eval/John [name]  Chief Complaint  Patient presents with  . pulm consult    Pt has increase SOB for last months  Dyspnea:  60 ft from curb to door / slow pace = MMRC3 = can't walk 100 yards even at a slow pace at a flat grade s stopping due to sob   Cough: none Sleep: insomnia but able to lie flat  SABA use: never  rec Bevespi Take 2 puffs first thing in am and then another 2 puffs about 12 hours later.  Work on inhaler technique    12/07/2019  f/u ov/John [name] re GOLD II/ group b  Chief Complaint  Patient presents with  . Follow-up    Breathing has improved since he has started on o2. Spouse is concerned that he is sleeping alot more than usual.   Dyspnea:  60 ft curb to door on 2 lpm  Cough:  None  Sleeping: in recliner about 30 degrees/ wakes up feeling ok / no ha  SABA use: 2 02: 2.5 hs and 2lpm during day rec Make sure you check your oxygen saturations at highest level of activity to be sure it stays over 90% and adjust upward to maintain this level if needed but remember to turn it back to previous settings when you stop (to conserve your supply). The key is to stop smoking completely before smoking completely stops you!    03/09/2020  f/u ov/John [name] re: GOLD II/    breztri 2bid / had both covid 19 vaccines  Chief Complaint  Patient presents with  . Follow-up    Breathing is unchanged since the last visit. He is still smoking. Wants to discuss getting a rescue inhaler.   Dyspnea:  Room  to room  Cough: spells not usually  noct or in am with production min mucoid  Sleeping:recliner x 30 degrees  SABA use: none 02: 3lpm 24/7    No obvious day to day or daytime variability or assoc excess/ purulent sputum or mucus plugs or hemoptysis or cp or chest tightness, subjective wheeze or overt sinus or hb symptoms.   Sleeping as above  without nocturnal  or early am exacerbation  of respiratory  c/o's or need for noct saba. Also denies any obvious fluctuation of symptoms with weather or environmental changes or other aggravating or alleviating factors except as outlined above   No unusual exposure hx or h/o childhood pna/ asthma or knowledge of premature birth.  Current Allergies, Complete Past Medical History, Past Surgical History, Family History, and Social History were reviewed in [employer] record.  ROS  The following are not active complaints unless bolded Hoarseness, sore throat, dysphagia, dental problems, itching, sneezing,  nasal congestion or discharge of excess mucus or purulent secretions, ear ache,   fever, chills, sweats, unintended wt loss or wt gain, classically pleuritic or exertional cp,  orthopnea pnd or arm/hand swelling  or leg swelling, presyncope,  palpitations, abdominal pain, anorexia, nausea, vomiting, diarrhea  or change in bowel habits or change in bladder habits, change in stools or change in urine, dysuria, hematuria,  rash, arthralgias, visual complaints, headache, numbness, weakness or ataxia or problems with walking or coordination,  change in mood or  memory.        Current Meds  Medication Sig  . aspirin EC 81 MG tablet Take 81 mg by mouth daily.  Marland Kitchen atorvastatin (LIPITOR) 40 MG tablet Take 1 tablet (40 mg total) by mouth daily.  . Budeson-Glycopyrrol-Formoterol (BREZTRI AEROSPHERE) 160-9-4.8 MCG/ACT AERO Inhale 2 puffs into the lungs in the morning and at bedtime.  . furosemide (LASIX) 40 MG tablet Take 1 tablet (40 mg total)  by mouth 2 (two) times daily.  . Multiple Vitamin (MULTIVITAMIN WITH MINERALS) TABS tablet Take 1 tablet by mouth daily.  . nitroGLYCERIN (NITROSTAT) 0.4 MG SL tablet Place 1 tablet (0.4 mg total) under the tongue every 5 (five) minutes as needed for chest pain.              .       Objective:      Chronically ill appearing wm nad     03/09/2020       64.4  12/07/19 66.2 kg  11/23/19 68 kg  11/23/19 68.3 kg    Vital signs reviewed  03/09/2020  - Note at rest 02 sats  93% on 3lpm       HEENT : pt wearing mask not removed for exam due to covid -19 concerns.    NECK :  without JVD/Nodes/TM/ nl carotid upstrokes bilaterally   LUNGS: no acc muscle use,  Mod barrel  contour chest wall with bilateral  Distant bs s audible wheeze and  without cough on insp or exp maneuvers and mod  Hyperresonant  to  percussion bilaterally     CV:  RRR  no s3 or murmur or increase in P2, and no edema   ABD:  soft and nontender with pos mid insp Hoover's  in the supine position. No bruits or organomegaly appreciated, bowel sounds nl  MS:     ext warm without deformities, calf tenderness, cyanosis or clubbing No obvious joint restrictions   SKIN: warm and dry without lesions    NEURO:  alert, approp, nl sensorium with  no motor or cerebellar deficits apparent.                Assessment

## 2020-03-09 NOTE — Patient Instructions (Addendum)
No change in your medications except add rescue or emergency inhaler  Only use your albuterol as a rescue medication to be used if you can't catch your breath by resting or doing a relaxed purse lip breathing pattern.  - The less you use it, the better it will work when you need it. - Ok to use up to 2 puffs  every 4 hours if you must but call for immediate appointment if use goes up over your usual need - Don't leave home without it !!  (think of it like the spare tire for your car)    Suggest e-cigs as an optional  "one way bridge"  Off all tobacco products   Please remember to go to the lab department   for your tests - we will call you with the results when they are available.      Please schedule a follow up visit in 3 months but call sooner if needed

## 2020-03-13 LAB — ALPHA-1 ANTITRYPSIN PHENOTYPE: A-1 Antitrypsin, Ser: 110 mg/dL (ref 101–187)

## 2020-03-22 ENCOUNTER — Encounter: Payer: Self-pay | Admitting: Internal Medicine

## 2020-03-22 ENCOUNTER — Other Ambulatory Visit: Payer: Self-pay

## 2020-03-22 ENCOUNTER — Ambulatory Visit (INDEPENDENT_AMBULATORY_CARE_PROVIDER_SITE_OTHER): Payer: Self-pay | Admitting: Internal Medicine

## 2020-03-22 DIAGNOSIS — J449 Chronic obstructive pulmonary disease, unspecified: Secondary | ICD-10-CM

## 2020-03-22 DIAGNOSIS — J9612 Chronic respiratory failure with hypercapnia: Secondary | ICD-10-CM

## 2020-03-22 DIAGNOSIS — F17210 Nicotine dependence, cigarettes, uncomplicated: Secondary | ICD-10-CM

## 2020-03-22 DIAGNOSIS — J9611 Chronic respiratory failure with hypoxia: Secondary | ICD-10-CM

## 2020-03-22 NOTE — Assessment & Plan Note (Addendum)
HC03  12/11/19  = 42   Relatively well compensated at present on 3pm np  Rec: Make sure you check your oxygen saturations at highest level of activity to be sure it stays over 90% and adjust upward to maintain this level if needed but remember to turn it back to previous settings when you stop (to conserve your supply).   Also note: Though somewhat paradoxic, when the lung fails to clear C02 properly and pC02 rises the lung then becomes a more efficient scavenger of C02 allowing lower work of breathing and  better C02 clearance albeit at a higher serum pC02 level - this is why pts can look a lot better than their ABG's would suggest and why it's so difficult to prognosticate endstage dz.  It's also why I strongly rec DNI status (ventilating pts down to a nl pC02 adversely affects this compensatory mechanism) though he is not quite at that stage yet.  I had an extended discussion with the patient and reviewed all relevant studies so total time was 20 minutes with moderate level of MDM.  Each maintenance medication was reviewed in detail including most importantly the difference between maintenance and prns and under what circumstances the prns are to be triggered using an action plan format that is not reflected in the computer generated alphabetically organized AVS.     Please see AVS for specific instructions unique to this visit that I personally wrote and verbalized to the the pt in detail and then reviewed with pt  by my nurse highlighting any  changes in therapy recommended at today's visit to their plan of care.

## 2020-03-22 NOTE — Assessment & Plan Note (Signed)
Active  smoker Currently smoking 1 pack/day Over 100 packs/day smoking history  4-5 min discussion re active cigarette smoking in addition to office E&M  Ask about tobacco use:   ongoing Advise quitting Suggested e-cigs as an optional  "one way bridge"  Off all tobacco products Assess willingness:  Not committed at this point Assist in quit attempt:  Per PCP when ready Arrange follow up:   Follow up per Primary Care planned

## 2020-03-22 NOTE — Assessment & Plan Note (Signed)
Active smoker - PFT's 10/01/06 FEV1 2.23 (60%) ratio 47 with 5% resp to saba and DLCO 102% and classic curvature to f/v loop  - 12/25/2018  After extensive coaching inhaler device,  effectiveness =    75% so try BEVESPI 2bid changed to breztri by pharmacy due to access  - Labs ordered 03/09/2020  :    alpha one AT phenotype  = MZ  level 110    Group D in terms of symptom/risk and laba/lama/ICS  therefore appropriate rx at this point >>>  Continue breztri 2bid  Discussed implications of MZ for him (potentially candidate for replacement but need to quit smoking first to see what this impact has ) and for his family members as they may carry the Z as well

## 2020-03-22 NOTE — Progress Notes (Signed)
[name], male    DOB: 1955-11-08,     MRN: [id number]   ZACHARIUS FUNARI, male    DOB: September 14, 1956,     MRN: [id number]   Brief patient profile:  29 yowm MZ/active smoker worked at [employer] before then closed, exposed to brake shoes  Late 80s and progressive doe x 2008 when eval in pulmonary clinic by me (records requested) but never on maint inhalers or 02 so referred to pulmonary clinic 12/25/2018 by Dr   [facility] dept.    Has IHD s/p multiple stents/ follow by [facility]     History of Present Illness  12/25/2018  Pulmonary/ 1st office eval/Rosmary Dionisio  Chief Complaint  Patient presents with  . pulm consult    Pt has increase SOB for last months  Dyspnea:  60 ft from curb to door / slow pace = MMRC3 = can't walk 100 yards even at a slow pace at a flat grade s stopping due to sob   Cough: none Sleep: insomnia but able to lie flat  SABA use: never  rec Bevespi Take 2 puffs first thing in am and then another 2 puffs about 12 hours later.  Work on inhaler technique    12/07/2019  f/u ov/Jeancarlos Marchena re GOLD II/ group b  Chief Complaint  Patient presents with  . Follow-up    Breathing has improved since he has started on o2. Spouse is concerned that he is sleeping alot more than usual.   Dyspnea:  60 ft curb to door on 2 lpm  Cough:  None  Sleeping: in recliner about 30 degrees/ wakes up feeling ok / no ha  SABA use: 2 02: 2.5 hs and 2lpm during day rec Make sure you check your oxygen saturations at highest level of activity to be sure it stays over 90% and adjust upward to maintain this level if needed but remember to turn it back to previous settings when you stop (to conserve your supply). The key is to stop smoking completely before smoking completely stops you!    03/09/2020  f/u ov/Danisha Brassfield re: GOLD II/    breztri 2bid   Chief Complaint  Patient presents with  . Follow-up    Breathing is unchanged since the last visit. He is still smoking. Wants to discuss  getting a rescue inhaler.   Dyspnea:  Room to room  Cough: spells not usually  noct or in am with production min mucoid  Sleeping:recliner x 30 degrees  SABA use: none 02: 3lpm 24/7  rec No change in your medications except add rescue or emergency inhaler Only use your albuterol as a rescue medication Suggest e-cigs as an optional  "one way bridge"  Off all tobacco products    03/22/2020  f/u ov/Maycol Hoying re:  GOLD II/  MZ   [facility] 01/12/20  Chief Complaint  Patient presents with  . Follow-up    Breathing is about the same- Here to discuss alpha 1 results. He has been noticing some heart palpitations and pressure in his head.    Dyspnea:  Room to room  Cough: none  Sleeping: bed flat right side  SABA use: not needing  02: around 3lpm  24/7 not checking sats  1 ppd     No obvious day to day or daytime variability or assoc excess/ purulent sputum or mucus plugs or hemoptysis or cp or chest tightness, subjective wheeze or overt sinus or hb symptoms.   Sleeping  without nocturnal  or  early am exacerbation  of respiratory  c/o's or need for noct saba. Also denies any obvious fluctuation of symptoms with weather or environmental changes or other aggravating or alleviating factors except as outlined above   No unusual exposure hx or h/o childhood pna/ asthma or knowledge of premature birth.  Current Allergies, Complete Past Medical History, Past Surgical History, Family History, and Social History were reviewed in [employer] record.  ROS  The following are not active complaints unless bolded Hoarseness, sore throat, dysphagia, dental problems, itching, sneezing,  nasal congestion or discharge of excess mucus or purulent secretions, ear ache,   fever, chills, sweats, unintended wt loss or wt gain, classically pleuritic or exertional cp,  orthopnea pnd or arm/hand swelling  or leg swelling, presyncope, palpitations, abdominal pain, anorexia, nausea, vomiting, diarrhea  or  change in bowel habits or change in bladder habits, change in stools or change in urine, dysuria, hematuria,  rash, arthralgias, visual complaints, headache  Sporadic, not worse in am , numbness, weakness or ataxia or problems with walking or coordination,  change in mood or  memory.        Current Meds  Medication Sig  . albuterol (PROAIR HFA) 108 (90 Base) MCG/ACT inhaler 2 puffs every 4 hours as needed only  if your can't catch your breath  . aspirin EC 81 MG tablet Take 81 mg by mouth daily.  Marland Kitchen atorvastatin (LIPITOR) 40 MG tablet Take 1 tablet (40 mg total) by mouth daily.  . Budeson-Glycopyrrol-Formoterol (BREZTRI AEROSPHERE) 160-9-4.8 MCG/ACT AERO Inhale 2 puffs into the lungs in the morning and at bedtime.  . furosemide (LASIX) 40 MG tablet Take 1 tablet (40 mg total) by mouth 2 (two) times daily.  . Multiple Vitamin (MULTIVITAMIN WITH MINERALS) TABS tablet Take 1 tablet by mouth daily.  . nitroGLYCERIN (NITROSTAT) 0.4 MG SL tablet Place 1 tablet (0.4 mg total) under the tongue every 5 (five) minutes as needed for chest pain.                  Objective:      Wt Readings from Last 3 Encounters:  03/22/20 142 lb (64.4 kg)  03/09/20 142 lb (64.4 kg)  12/07/19 146 lb (66.2 kg)     Vital signs reviewed - Note on arrival 02 sats  95% on 3lpm      Chronically ill appearing thin wm nad  HEENT : pt wearing mask not removed for exam due to covid -19 concerns.    NECK :  without JVD/Nodes/TM/ nl carotid upstrokes bilaterally   LUNGS: no acc muscle use,  Mod barrel  contour chest wall with bilateral  Distant bs s audible wheeze and  without cough on insp or exp maneuvers and mod  Hyperresonant  to  percussion bilaterally     CV:  RRR  no s3 or murmur or increase in P2, and no edema   ABD:  soft and nontender with pos mid insp Hoover's  in the supine position. No bruits or organomegaly appreciated, bowel sounds nl  MS:     ext warm without deformities, calf tenderness,  cyanosis or clubbing No obvious joint restrictions   SKIN: warm and dry without lesions    NEURO:  alert, approp, nl sensorium with  no motor or cerebellar deficits apparent.               Assessment

## 2020-03-22 NOTE — Patient Instructions (Addendum)
Plan A = Automatic = Always=    Breztri Take 2 puffs first thing in am and then another 2 puffs about 12 hours later.    Work on inhaler technique:  relax and gently blow all the way out then take a nice smooth deep breath back in, triggering the inhaler at same time you start breathing in.  Hold for up to 5 seconds if you can. Blow out thru nose. Rinse and gargle with water when done   Plan B = Backup (to supplement plan A, not to replace it) Only use your albuterol inhaler as a rescue medication to be used if you can't catch your breath by resting or doing a relaxed purse lip breathing pattern.  - The less you use it, the better it will work when you need it. - Ok to use the inhaler up to 2 puffs  every 4 hours if you must but call for appointment if use goes up over your usual need - Don't leave home without it !!  (think of it like the spare tire for your car)   Your family needs check for alpha one Antitrypsin phenotype to be sure they are  MM and not MZ like you   The key is to stop smoking completely before smoking completely stops you!   Make sure you check your oxygen saturations at highest level of activity to be sure it stays over 90% and adjust upward to maintain this level if needed but remember to turn it back to previous settings when you stop (to conserve your supply).   Please schedule a follow up visit in 3 months but call sooner if needed  Add:  Needs pfts and cxr on return

## 2020-04-10 ENCOUNTER — Ambulatory Visit: Payer: Self-pay | Admitting: Internal Medicine

## 2020-04-18 ENCOUNTER — Inpatient Hospital Stay (HOSPITAL_COMMUNITY)
Admission: EM | Admit: 2020-04-18 | Discharge: 2020-04-25 | DRG: 208 | Disposition: A | Discharge status: Home / Self Care | Payer: Self-pay | Attending: Internal Medicine | Admitting: Internal Medicine

## 2020-04-18 ENCOUNTER — Emergency Department (HOSPITAL_COMMUNITY): Payer: Self-pay

## 2020-04-18 ENCOUNTER — Telehealth: Payer: Self-pay | Admitting: Cardiovascular Disease

## 2020-04-18 ENCOUNTER — Encounter (HOSPITAL_COMMUNITY): Payer: Self-pay | Admitting: Emergency Medicine

## 2020-04-18 DIAGNOSIS — Z9114 Patient's other noncompliance with medication regimen: Secondary | ICD-10-CM

## 2020-04-18 DIAGNOSIS — J9622 Acute and chronic respiratory failure with hypercapnia: Secondary | ICD-10-CM | POA: Diagnosis present

## 2020-04-18 DIAGNOSIS — I110 Hypertensive heart disease with heart failure: Secondary | ICD-10-CM | POA: Diagnosis present

## 2020-04-18 DIAGNOSIS — R739 Hyperglycemia, unspecified: Secondary | ICD-10-CM | POA: Diagnosis not present

## 2020-04-18 DIAGNOSIS — J9612 Chronic respiratory failure with hypercapnia: Secondary | ICD-10-CM

## 2020-04-18 DIAGNOSIS — E876 Hypokalemia: Secondary | ICD-10-CM | POA: Diagnosis not present

## 2020-04-18 DIAGNOSIS — J9611 Chronic respiratory failure with hypoxia: Secondary | ICD-10-CM

## 2020-04-18 DIAGNOSIS — J9602 Acute respiratory failure with hypercapnia: Secondary | ICD-10-CM

## 2020-04-18 DIAGNOSIS — T380X5A Adverse effect of glucocorticoids and synthetic analogues, initial encounter: Secondary | ICD-10-CM | POA: Diagnosis present

## 2020-04-18 DIAGNOSIS — Z4659 Encounter for fitting and adjustment of other gastrointestinal appliance and device: Secondary | ICD-10-CM

## 2020-04-18 DIAGNOSIS — I10 Essential (primary) hypertension: Secondary | ICD-10-CM | POA: Diagnosis present

## 2020-04-18 DIAGNOSIS — Z8249 Family history of ischemic heart disease and other diseases of the circulatory system: Secondary | ICD-10-CM

## 2020-04-18 DIAGNOSIS — Z955 Presence of coronary angioplasty implant and graft: Secondary | ICD-10-CM

## 2020-04-18 DIAGNOSIS — I2510 Atherosclerotic heart disease of native coronary artery without angina pectoris: Secondary | ICD-10-CM | POA: Diagnosis present

## 2020-04-18 DIAGNOSIS — J449 Chronic obstructive pulmonary disease, unspecified: Secondary | ICD-10-CM | POA: Diagnosis present

## 2020-04-18 DIAGNOSIS — I952 Hypotension due to drugs: Secondary | ICD-10-CM | POA: Diagnosis not present

## 2020-04-18 DIAGNOSIS — Z79899 Other long term (current) drug therapy: Secondary | ICD-10-CM

## 2020-04-18 DIAGNOSIS — J189 Pneumonia, unspecified organism: Secondary | ICD-10-CM | POA: Diagnosis present

## 2020-04-18 DIAGNOSIS — E861 Hypovolemia: Secondary | ICD-10-CM | POA: Diagnosis not present

## 2020-04-18 DIAGNOSIS — E43 Unspecified severe protein-calorie malnutrition: Secondary | ICD-10-CM | POA: Diagnosis present

## 2020-04-18 DIAGNOSIS — Z681 Body mass index (BMI) 19 or less, adult: Secondary | ICD-10-CM

## 2020-04-18 DIAGNOSIS — E872 Acidosis: Secondary | ICD-10-CM | POA: Diagnosis present

## 2020-04-18 DIAGNOSIS — Z978 Presence of other specified devices: Secondary | ICD-10-CM

## 2020-04-18 DIAGNOSIS — Z20822 Contact with and (suspected) exposure to covid-19: Secondary | ICD-10-CM | POA: Diagnosis present

## 2020-04-18 DIAGNOSIS — Z9981 Dependence on supplemental oxygen: Secondary | ICD-10-CM

## 2020-04-18 DIAGNOSIS — G4089 Other seizures: Secondary | ICD-10-CM | POA: Diagnosis present

## 2020-04-18 DIAGNOSIS — G9341 Metabolic encephalopathy: Secondary | ICD-10-CM | POA: Diagnosis present

## 2020-04-18 DIAGNOSIS — Z781 Physical restraint status: Secondary | ICD-10-CM

## 2020-04-18 DIAGNOSIS — I2721 Secondary pulmonary arterial hypertension: Secondary | ICD-10-CM | POA: Diagnosis present

## 2020-04-18 DIAGNOSIS — J441 Chronic obstructive pulmonary disease with (acute) exacerbation: Secondary | ICD-10-CM

## 2020-04-18 DIAGNOSIS — E785 Hyperlipidemia, unspecified: Secondary | ICD-10-CM | POA: Diagnosis present

## 2020-04-18 DIAGNOSIS — Z87891 Personal history of nicotine dependence: Secondary | ICD-10-CM | POA: Diagnosis present

## 2020-04-18 DIAGNOSIS — R54 Age-related physical debility: Secondary | ICD-10-CM | POA: Diagnosis present

## 2020-04-18 DIAGNOSIS — F10239 Alcohol dependence with withdrawal, unspecified: Secondary | ICD-10-CM | POA: Diagnosis not present

## 2020-04-18 DIAGNOSIS — J9621 Acute and chronic respiratory failure with hypoxia: Secondary | ICD-10-CM | POA: Diagnosis present

## 2020-04-18 DIAGNOSIS — J439 Emphysema, unspecified: Principal | ICD-10-CM | POA: Diagnosis present

## 2020-04-18 DIAGNOSIS — K567 Ileus, unspecified: Secondary | ICD-10-CM | POA: Diagnosis present

## 2020-04-18 DIAGNOSIS — T4275XA Adverse effect of unspecified antiepileptic and sedative-hypnotic drugs, initial encounter: Secondary | ICD-10-CM | POA: Diagnosis not present

## 2020-04-18 DIAGNOSIS — Z7951 Long term (current) use of inhaled steroids: Secondary | ICD-10-CM

## 2020-04-18 DIAGNOSIS — D696 Thrombocytopenia, unspecified: Secondary | ICD-10-CM | POA: Diagnosis present

## 2020-04-18 DIAGNOSIS — F17210 Nicotine dependence, cigarettes, uncomplicated: Secondary | ICD-10-CM | POA: Diagnosis present

## 2020-04-18 DIAGNOSIS — I5032 Chronic diastolic (congestive) heart failure: Secondary | ICD-10-CM | POA: Diagnosis present

## 2020-04-18 DIAGNOSIS — Z7982 Long term (current) use of aspirin: Secondary | ICD-10-CM

## 2020-04-18 DIAGNOSIS — R4182 Altered mental status, unspecified: Secondary | ICD-10-CM

## 2020-04-18 DIAGNOSIS — Z880 Allergy status to penicillin: Secondary | ICD-10-CM

## 2020-04-18 DIAGNOSIS — I252 Old myocardial infarction: Secondary | ICD-10-CM

## 2020-04-18 DIAGNOSIS — J9690 Respiratory failure, unspecified, unspecified whether with hypoxia or hypercapnia: Secondary | ICD-10-CM

## 2020-04-18 LAB — BLOOD GAS, ARTERIAL
Acid-Base Excess: 23.1 mmol/L — ABNORMAL HIGH (ref 0.0–2.0)
Bicarbonate: 50.6 mmol/L — ABNORMAL HIGH (ref 20.0–28.0)
Drawn by: 560371
FIO2: 35
O2 Saturation: 98.1 %
Patient temperature: 37
pCO2 arterial: 104 mmHg (ref 32.0–48.0)
pH, Arterial: 7.307 — ABNORMAL LOW (ref 7.350–7.450)
pO2, Arterial: 104 mmHg (ref 83.0–108.0)

## 2020-04-18 LAB — CBC
HCT: 43.5 % (ref 39.0–52.0)
Hemoglobin: 13.3 g/dL (ref 13.0–17.0)
MCH: 33.8 pg (ref 26.0–34.0)
MCHC: 30.6 g/dL (ref 30.0–36.0)
MCV: 110.4 fL — ABNORMAL HIGH (ref 80.0–100.0)
Platelets: 143 10*3/uL — ABNORMAL LOW (ref 150–400)
RBC: 3.94 MIL/uL — ABNORMAL LOW (ref 4.22–5.81)
RDW: 12.2 % (ref 11.5–15.5)
WBC: 7.6 10*3/uL (ref 4.0–10.5)
nRBC: 0 % (ref 0.0–0.2)

## 2020-04-18 LAB — BASIC METABOLIC PANEL
BUN: 14 mg/dL (ref 8–23)
CO2: >50 mmol/L — ABNORMAL HIGH (ref 22–32)
Calcium: 10.4 mg/dL — ABNORMAL HIGH (ref 8.9–10.3)
Chloride: 82 mmol/L — ABNORMAL LOW (ref 98–111)
Creatinine, Ser: 0.8 mg/dL (ref 0.61–1.24)
GFR calc Af Amer: >60 mL/min (ref >60)
GFR calc non Af Amer: >60 mL/min (ref >60)
Glucose, Bld: 157 mg/dL — ABNORMAL HIGH (ref 70–99)
Potassium: 4.1 mmol/L (ref 3.5–5.1)
Sodium: 139 mmol/L (ref 135–145)

## 2020-04-18 LAB — HEPATIC FUNCTION PANEL
ALT: 20 U/L (ref 0–44)
AST: 27 U/L (ref 15–41)
Albumin: 3.3 g/dL — ABNORMAL LOW (ref 3.5–5.0)
Alkaline Phosphatase: 107 U/L (ref 38–126)
Bilirubin, Direct: 0.1 mg/dL (ref 0.0–0.2)
Indirect Bilirubin: 0.6 mg/dL (ref 0.3–0.9)
Total Bilirubin: 0.7 mg/dL (ref 0.3–1.2)
Total Protein: 6 g/dL — ABNORMAL LOW (ref 6.5–8.1)

## 2020-04-18 LAB — SARS CORONAVIRUS 2 BY RT PCR (HOSPITAL ORDER, PERFORMED IN ~~LOC~~ HOSPITAL LAB): SARS Coronavirus 2: NEGATIVE

## 2020-04-18 LAB — TROPONIN I (HIGH SENSITIVITY)
Troponin I (High Sensitivity): 15 ng/L (ref <18)
Troponin I (High Sensitivity): 14 ng/L (ref <18)

## 2020-04-18 LAB — AMMONIA: Ammonia: 31 umol/L (ref 9–35)

## 2020-04-18 MED ORDER — SODIUM CHLORIDE 0.9% FLUSH
3.0000 mL | Freq: Two times a day (BID) | INTRAVENOUS | Status: DC
  Administered 2020-04-18 – 2020-04-25 (×14): 3 mL via INTRAVENOUS

## 2020-04-18 MED ORDER — ENOXAPARIN SODIUM 40 MG/0.4ML ~~LOC~~ SOLN
40.0000 mg | SUBCUTANEOUS | Status: DC
  Administered 2020-04-18 – 2020-04-24 (×7): 40 mg via SUBCUTANEOUS
  Filled 2020-04-18 (×7): qty 0.4

## 2020-04-18 MED ORDER — SODIUM CHLORIDE 0.9% FLUSH
3.0000 mL | Freq: Once | INTRAVENOUS | Status: AC
  Administered 2020-04-18: 3 mL via INTRAVENOUS

## 2020-04-18 MED ORDER — ADULT MULTIVITAMIN W/MINERALS CH: 1.0000 | ORAL_TABLET | Freq: Every day | ORAL | Status: DC

## 2020-04-18 MED ORDER — ASPIRIN EC 81 MG PO TBEC
81.0000 mg | DELAYED_RELEASE_TABLET | Freq: Every day | ORAL | Status: DC
  Administered 2020-04-20: 81 mg via ORAL
  Filled 2020-04-18: qty 1

## 2020-04-18 MED ORDER — IPRATROPIUM-ALBUTEROL 0.5-2.5 (3) MG/3ML IN SOLN
RESPIRATORY_TRACT | Status: AC
  Filled 2020-04-18: qty 3

## 2020-04-18 MED ORDER — POLYETHYLENE GLYCOL 3350 17 G PO PACK
17.0000 g | PACK | Freq: Every day | ORAL | Status: DC | PRN
  Administered 2020-04-21: 17 g via ORAL
  Filled 2020-04-18: qty 1

## 2020-04-18 MED ORDER — ACETAMINOPHEN 650 MG RE SUPP: 650.0000 mg | Freq: Four times a day (QID) | RECTAL | Status: DC | PRN

## 2020-04-18 MED ORDER — ATORVASTATIN CALCIUM 40 MG PO TABS: 40.0000 mg | ORAL_TABLET | Freq: Every day | ORAL | Status: DC

## 2020-04-18 MED ORDER — SODIUM CHLORIDE 0.9 % IV SOLN
100.0000 mg | Freq: Two times a day (BID) | INTRAVENOUS | Status: AC
  Administered 2020-04-18 – 2020-04-23 (×11): 100 mg via INTRAVENOUS
  Filled 2020-04-18 (×12): qty 100

## 2020-04-18 MED ORDER — NICOTINE 7 MG/24HR TD PT24
7.0000 mg | MEDICATED_PATCH | Freq: Every day | TRANSDERMAL | Status: DC
  Administered 2020-04-18 – 2020-04-25 (×7): 7 mg via TRANSDERMAL
  Filled 2020-04-18 (×8): qty 1

## 2020-04-18 MED ORDER — METHYLPREDNISOLONE SODIUM SUCC 125 MG IJ SOLR
80.0000 mg | Freq: Three times a day (TID) | INTRAMUSCULAR | Status: DC
  Administered 2020-04-18 – 2020-04-20 (×5): 80 mg via INTRAVENOUS
  Filled 2020-04-18 (×5): qty 2

## 2020-04-18 MED ORDER — ARFORMOTEROL TARTRATE 15 MCG/2ML IN NEBU
15.0000 ug | INHALATION_SOLUTION | Freq: Two times a day (BID) | RESPIRATORY_TRACT | Status: DC
  Filled 2020-04-18 (×2): qty 2

## 2020-04-18 MED ORDER — ALBUTEROL SULFATE (2.5 MG/3ML) 0.083% IN NEBU
2.5000 mg | INHALATION_SOLUTION | RESPIRATORY_TRACT | Status: DC | PRN
  Administered 2020-04-19: 2.5 mg via RESPIRATORY_TRACT
  Filled 2020-04-18: qty 3

## 2020-04-18 MED ORDER — FUROSEMIDE 40 MG PO TABS: 40.0000 mg | ORAL_TABLET | Freq: Every day | ORAL | Status: DC

## 2020-04-18 MED ORDER — IPRATROPIUM-ALBUTEROL 0.5-2.5 (3) MG/3ML IN SOLN
3.0000 mL | Freq: Four times a day (QID) | RESPIRATORY_TRACT | Status: DC
  Administered 2020-04-18: 3 mL via RESPIRATORY_TRACT

## 2020-04-18 MED ORDER — ACETAMINOPHEN 325 MG PO TABS: 650.0000 mg | ORAL_TABLET | Freq: Four times a day (QID) | ORAL | Status: DC | PRN

## 2020-04-18 MED ORDER — BUDESONIDE 0.5 MG/2ML IN SUSP
0.5000 mg | Freq: Two times a day (BID) | RESPIRATORY_TRACT | Status: DC
  Administered 2020-04-19 – 2020-04-25 (×12): 0.5 mg via RESPIRATORY_TRACT
  Filled 2020-04-18 (×15): qty 2

## 2020-04-18 MED ORDER — BUPROPION HCL ER (XL) 300 MG PO TB24: 300.0000 mg | ORAL_TABLET | Freq: Every day | ORAL | Status: DC

## 2020-04-18 NOTE — Telephone Encounter (Signed)
New Message  Patient c/o Palpitations:  High priority if patient c/o lightheadedness, shortness of breath, or chest pain  1) How long have you had palpitations/irregular HR/ Afib? Are you having the symptoms now? Yes, since on Friday/Saturday  2) Are you currently experiencing lightheadedness, SOB or CP? Lightheadedness (head feels funny)  3) Do you have a history of afib (atrial fibrillation) or irregular heart rhythm? No  4) Have you checked your BP or HR? (document readings if available): N/A  5) Are you experiencing any other symptoms? Patient's daughter called in stating that she thinks that patient has had a stroke. Patient is sleeping a lot and having issues with confusion/not understanding what is going on, paranoia, eyes are weak and droopy, patient feeling weak/tired, patient is looking spaced out. Patient's daughter would like to take patient to the ER, but patient is refusing to go unless Dr. Clifton James or his nurse tell him to.

## 2020-04-18 NOTE — ED Provider Notes (Signed)
[facility] EMERGENCY DEPARTMENT Provider Note   CSN: [id number] Arrival date & time: 04/18/20  1157     History Chief Complaint  Patient presents with  . Shortness of Breath  . Palpitations    John [name] is a 64 y.o. male.  Patient brought in by family member.  Had a long wait in the waiting room.  Patient is known to have COPD.  Followed by Dr. Sharee Pimple from pulmonary medicine.  Patient normally on 3 L of oxygen at all times.  Past record review shows evidence of respiratory failure either due to hypoxia or hypercapnia.  Patient still smokes.  Patient has a past history of alcohol abuse in the past.  Patient talks about feeling short of breath and palpitations wife states though that the real problem is that he starting on Friday became more confused and sleeping about 18 hours a day.  Denies any chest pain.         Past Medical History:  Diagnosis Date  . Alcohol abuse 12/27/2010  . Allergic rhinitis, cause unspecified 12/27/2010  . Anal warts 04/11/2012  . CAD, NATIVE VESSEL 10/04/2010  . COPD (chronic obstructive pulmonary disease) (HCC)    "a touch" (09/28/2013)  . History of blood transfusion 03/2012   related to nose bleed  . HYPERLIPIDEMIA-MIXED 10/04/2010  . HYPERTENSION, BENIGN 10/04/2010  . Myocardial infarction (HCC) 09/16/2010  . Pneumonia    "when I was a kid"  . TOBACCO ABUSE 10/04/2010    Patient Active Problem List   Diagnosis Date Noted  . Chronic respiratory failure with hypoxia and hypercapnia (HCC) 12/09/2019  . Chronic respiratory failure with hypoxia (HCC) 11/23/2019  . Medication management 11/16/2019  . Sleeping difficulties 11/16/2019  . COPD MZ/ GOLD II spirometry  but 02 dep/ hypercarbic as of 11/2019 12/26/2018  . Exercise hypoxemia 12/26/2018  . Unstable angina (HCC) 09/27/2014  . Anal warts 04/11/2012  . Stented coronary artery   . Hemorrhagic shock (HCC) 03/30/2012  . Epistaxis 03/30/2012  . Dyspnea 01/02/2011  .  Preventative health care 12/27/2010  . Alcohol abuse 12/27/2010  . Allergic rhinitis, cause unspecified 12/27/2010  . HYPERLIPIDEMIA-MIXED 10/04/2010  . Cigarette smoker 10/04/2010  . HYPERTENSION, BENIGN 10/04/2010  . CAD, NATIVE VESSEL 10/04/2010    Past Surgical History:  Procedure Laterality Date  . CARDIAC CATHETERIZATION  ~ 2012  . CORONARY ANGIOPLASTY WITH STENT PLACEMENT  09/17/2010; 09/28/2014   "1; 2"  . FINGER SURGERY Left    "almost cut off" tip of 2nd digit  . LEFT HEART CATHETERIZATION WITH CORONARY ANGIOGRAM N/A 09/28/2014   Procedure: LEFT HEART CATHETERIZATION WITH CORONARY ANGIOGRAM;  Surgeon: Kathleene Hazel, MD;  Location: [facility] CATH LAB;  Service: Cardiovascular;  Laterality: N/A;  . NASAL ENDOSCOPY WITH EPISTAXIS CONTROL Bilateral 03/2012  . VASECTOMY         Family History  Problem Relation Age of Onset  . Brain cancer Mother   . Heart disease Father     Social History   Tobacco Use  . Smoking status: Current Every Day Smoker    Packs/day: 2.00    Years: 50.00    Pack years: 100.00    Types: Cigarettes  . Smokeless tobacco: Never Used  . Tobacco comment: 2 plus packs per day. He has a 100 + pack - year history of tobacco abuse currently. Former 4 ppd for 25 years.  Vaping Use  . Vaping Use: Never used  Substance Use Topics  . Alcohol use: Yes  Alcohol/week: 144.0 standard drinks    Types: 144 Cans of beer per week    Comment: 09/28/2014 "12 pack of beer per night"  . Drug use: No    Home Medications Prior to Admission medications   Medication Sig Start Date End Date Taking? Authorizing Provider  albuterol (PROAIR HFA) 108 (90 Base) MCG/ACT inhaler 2 puffs every 4 hours as needed only  if your can't catch your breath 03/09/20  Yes Nyoka Cowden, MD  aspirin EC 81 MG tablet Take 81 mg by mouth daily.   Yes [provider]  atorvastatin (LIPITOR) 40 MG tablet Take 1 tablet (40 mg total) by mouth daily. 04/15/19 04/18/20 Yes Kathleene Hazel, MD  Budeson-Glycopyrrol-Formoterol (BREZTRI AEROSPHERE) 160-9-4.8 MCG/ACT AERO Inhale 2 puffs into the lungs in the morning and at bedtime. 02/22/20  Yes Nyoka Cowden, MD  furosemide (LASIX) 40 MG tablet Take 1 tablet (40 mg total) by mouth 2 (two) times daily. Patient taking differently: Take 40 mg by mouth daily.  11/24/19  Yes Kathleene Hazel, MD  Multiple Vitamin (MULTIVITAMIN WITH MINERALS) TABS tablet Take 1 tablet by mouth daily.   Yes [provider]  nitroGLYCERIN (NITROSTAT) 0.4 MG SL tablet Place 1 tablet (0.4 mg total) under the tongue every 5 (five) minutes as needed for chest pain. 11/24/19  Yes Kathleene Hazel, MD    Allergies    Penicillins  Review of Systems   Review of Systems  Unable to perform ROS: Mental status change    Physical Exam Updated Vital Signs BP (!) 145/73   Pulse 99   Temp 98.6 F (37 C) (Oral)   Resp (!) 39   Ht 1.803 m (5\' 11" )   Wt 64.4 kg   SpO2 98%   BMI 19.80 kg/m   Physical Exam Vitals and nursing note reviewed.  Constitutional:      General: He is in acute distress.     Appearance: He is well-developed. He is ill-appearing.  HENT:     Head: Normocephalic and atraumatic.  Eyes:     Conjunctiva/sclera: Conjunctivae normal.     Pupils: Pupils are equal, round, and reactive to light.  Cardiovascular:     Rate and Rhythm: Normal rate and regular rhythm.     Heart sounds: No murmur heard.   Pulmonary:     Effort: Respiratory distress present.     Breath sounds: Normal breath sounds.  Abdominal:     Palpations: Abdomen is soft.     Tenderness: There is no abdominal tenderness.  Musculoskeletal:        General: No swelling. Normal range of motion.     Cervical back: Neck supple.  Skin:    General: Skin is warm and dry.  Neurological:     Comments: Patient awake and will follow commands.  But has confusion.  Able to move all 4 extremities.     ED Results / Procedures / Treatments    Labs (all labs ordered are listed, but only abnormal results are displayed) Labs Reviewed  BASIC METABOLIC PANEL - Abnormal; Notable for the following components:      Result Value   Chloride 82 (*)    CO2 >50 (*)    Glucose, Bld 157 (*)    Calcium 10.4 (*)    All other components within normal limits  CBC - Abnormal; Notable for the following components:   RBC 3.94 (*)    MCV 110.4 (*)    Platelets 143 (*)  All other components within normal limits  HEPATIC FUNCTION PANEL - Abnormal; Notable for the following components:   Total Protein 6.0 (*)    Albumin 3.3 (*)    All other components within normal limits  SARS CORONAVIRUS 2 BY RT PCR (HOSPITAL ORDER, PERFORMED IN [facility])  SARS CORONAVIRUS 2 BY RT PCR (HOSPITAL ORDER, PERFORMED IN [facility])  AMMONIA  BLOOD GAS, ARTERIAL  TROPONIN I (HIGH SENSITIVITY)  TROPONIN I (HIGH SENSITIVITY)    EKG EKG Interpretation  Date/Time:  Tuesday April 18 2020 12:25:38 EDT Ventricular Rate:  104 PR Interval:  132 QRS Duration: 90 QT Interval:  316 QTC Calculation: 415 R Axis:   91 Text Interpretation: Sinus tachycardia Right atrial enlargement Rightward axis Pulmonary disease pattern Minimal voltage criteria for LVH, may be normal variant ( Cornell product ) Abnormal ECG No significant change since last tracing Confirmed by Vanetta Mulders [id number]) on 04/18/2020 3:37:22 PM   Radiology DG Chest 2 View  Result Date: 04/18/2020 CLINICAL DATA:  Shortness of breath for 2 weeks EXAM: CHEST - 2 VIEW COMPARISON:  11/23/2019, 03/18/2018 FINDINGS: The heart size and mediastinal contours are within normal limits. Aortic atherosclerosis. Coronary artery stents. Hyperexpanded lungs with chronically coarsened interstitial markings. Bibasilar scarring. No new focal airspace consolidation, pleural effusion, or pneumothorax. The visualized skeletal structures are unremarkable. IMPRESSION: COPD with bibasilar scarring. No  new focal airspace consolidation. Electronically Signed   By: Duanne Guess D.O.   On: 04/18/2020 12:44   CT Head Wo Contrast  Result Date: 04/18/2020 CLINICAL DATA:  Mental status change EXAM: CT HEAD WITHOUT CONTRAST TECHNIQUE: Contiguous axial images were obtained from the base of the skull through the vertex without intravenous contrast. COMPARISON:  None. FINDINGS: Brain: There is no acute intracranial hemorrhage, mass effect, or edema. Gray-white differentiation is preserved. There is no extra-axial fluid collection. Ventricles and sulci are within normal limits in size and configuration. Vascular: There is mild atherosclerotic calcification at the skull base. Skull: Calvarium is unremarkable. Sinuses/Orbits: No acute finding. Other: None. IMPRESSION: No acute intracranial abnormality. Electronically Signed   By: Guadlupe Spanish M.D.   On: 04/18/2020 21:03    Procedures Procedures (including critical care time)  CRITICAL CARE Performed by: Vanetta Mulders Total critical care time: 45 minutes Critical care time was exclusive of separately billable procedures and treating other patients. Critical care was necessary to treat or prevent imminent or life-threatening deterioration. Critical care was time spent personally by me on the following activities: development of treatment plan with patient and/or surrogate as well as nursing, discussions with consultants, evaluation of patient's response to treatment, examination of patient, obtaining history from patient or surrogate, ordering and performing treatments and interventions, ordering and review of laboratory studies, ordering and review of radiographic studies, pulse oximetry and re-evaluation of patient's condition.   Medications Ordered in ED Medications  sodium chloride flush (NS) 0.9 % injection 3 mL (has no administration in time range)  doxycycline (VIBRAMYCIN) 100 mg in sodium chloride 0.9 % 250 mL IVPB (has no administration in  time range)  budesonide (PULMICORT) nebulizer solution 0.5 mg (has no administration in time range)  albuterol (PROVENTIL) (2.5 MG/3ML) 0.083% nebulizer solution 2.5 mg (has no administration in time range)  methylPREDNISolone sodium succinate (SOLU-MEDROL) 125 mg/2 mL injection 80 mg (has no administration in time range)  ipratropium-albuterol (DUONEB) 0.5-2.5 (3) MG/3ML nebulizer solution 3 mL (has no administration in time range)  nicotine (NICODERM CQ - dosed in mg/24 hr)  patch 7 mg (has no administration in time range)  buPROPion (WELLBUTRIN XL) 24 hr tablet 300 mg (has no administration in time range)    ED Course  I have reviewed the triage vital signs and the nursing notes.  Pertinent labs & imaging results that were available during my care of the patient were reviewed by me and considered in my medical decision making (see chart for details).    MDM Rules/Calculators/A&P                          Patient when first brought back was tachypneic.  Was complaining of feeling short of breath.  On 3 L of nasal cannula oxygen oxygen sats were 95%.  But he appeared confused.  After hearing the story from his wife with the confusion and increased sleeping starting on Friday was concerned that he probably had carbon dioxide retention.  Also his BMP that was already done while he was waiting on the waiting room showed a CO2 of greater than 50.  Blood gas was obtained which showed a pH of 7.24 PCO2 that was greater than 97 and a PO2 that was 183.  Based on this patient started on BiPAP.  Patient also given albuterol breathing treatment.  Chest x-ray without acute findings.  On the BiPAP patient's mentation seem to improve.  Ammonia level was normal rest of labs without significant abnormalities.  Discussed with critical care since he is followed by Dr. Sharee Pimple.  And the high PCO2 they suggested that they would consult and to have him admitted by the hospitalist service.  Patient's primary care  doctor is the health department and [facility] so he will be seen by the internal medicine service.  Contacted them and they will see the patient admit.  In addition head CT was done and that showed no evidence of any intracranial abnormalities.  We will that all of his mental status changes are related to hypercapnia.  Seems to be improving on BiPAP.  Covid negative.    Final Clinical Impression(s) / ED Diagnoses Final diagnoses:  Acute on chronic respiratory failure with hypercapnia (HCC)  Altered mental status, unspecified altered mental status type    Rx / DC Orders ED Discharge Orders    None       Vanetta Mulders, MD 04/18/20 2132

## 2020-04-18 NOTE — Progress Notes (Signed)
Pt transported from ED to [facility] room 14 without any complications.

## 2020-04-18 NOTE — ED Triage Notes (Signed)
Pt arrives to ED with of sob and palpations for over the last week. Pt wears home 02 3L at all times. Pt does smoke cigarettes. Pt denies any CP.

## 2020-04-18 NOTE — H&P (Signed)
Date: 04/19/2020               Patient Name:  John [name] MRN: [id number]  DOB: 1955-10-04 Age / Sex: 64 y.o., male   PCP: Nyoka Cowden, MD         Medical Service: Internal Medicine Teaching Service         Attending Physician: Dr. Inez Catalina, MD    First Contact: Dr. Tonye Royalty Pager: [phone]  Second Contact: Dr. Jodelle Red Pager: [phone]       After Hours (After 5p/  First Contact Pager: [phone]  weekends / holidays): Second Contact Pager: [phone]   Chief Complaint: Confusion  History of Present Illness:  Mr. Dolph Tavano. Parkin is a 64 year old man with past medical history significant for tobacco dependence, COPD (on 3L O2), CAD (s/p stent x2), HTN, HLD, and prior history of alcohol abuse who presented to [facility] on 07/27 with confusion.  His girlfriend provides the majority of the history as patient is currently on BiPAP. She reports that for the past two months, he has had progressively worsening confusion, sleepiness, and decreased appetite. He has been sleeping upwards of 18 hours per day, and he has been very inactive. He has had no appetite and has significantly decreased his PO intake with resultant weight loss. Additionally, he endorses sporadic episodes of his heart racing at rest which have no identifiable trigger.  His confusion worsened significantly on Saturday morning with altered speech. He initially did not want to come to the ED for evaluation, however his girlfriend convinced him to come in. Otherwise, he denies symptoms of chest pain, fevers, chills, nausea, vomiting, or abdominal pain.  Of note, he follows closely with his pulmonologist, Dr. Sherene Sires, in the outpatient setting and was last seen on June 30th. He is on 3L O2 [place] at home, and uses a Breztri inhaler twice daily. He has only required to use his home rescue inhaler twice in the past few months. He is prescribed lasix 40mg  bid, however he has only been taking this medication once daily. His  girlfriend reports discontinuing this medication entirely several days ago as he did not appear to have any fluid on him.  ED Course: On arrival to the ED, patient was mildly tachycardic (106), saturating at 95% on 3L [place]. EKG showed normal sinus rhythm. CBC was unremarkable; BMP revaled calcium 10.4, bicarb >50; troponin 15; ammonia 31; ABG pH 7.307, pCO2 104, bicarb 50.6; COVID negative. CXR and CTHead were unremarkable. He was placed on BiPAP, received a duoneb treatment, and started on doxycycline and solumedrol. PCCM evaluated the patient and felt he was appropriate for step-down unit. He was subsequently admitted to IMTS for further evaluation and treatment.   Meds:  Current Meds  Medication Sig  . albuterol (PROAIR HFA) 108 (90 Base) MCG/ACT inhaler 2 puffs every 4 hours as needed only  if your can't catch your breath  . aspirin EC 81 MG tablet Take 81 mg by mouth daily.  atorvastatin (LIPITOR) 40 MG tablet Take 1 tablet (40 mg total) by mouth daily.  . Budeson-Glycopyrrol-Formoterol (BREZTRI AEROSPHERE) 160-9-4.8 MCG/ACT AERO Inhale 2 puffs into the lungs in the morning and at bedtime.  . furosemide (LASIX) 40 MG tablet Take 1 tablet (40 mg total) by mouth 2 (two) times daily. (Patient taking differently: Take 40 mg by mouth daily. )  . Multiple Vitamin (MULTIVITAMIN WITH MINERALS) TABS tablet Take 1 tablet by mouth daily.  . nitroGLYCERIN (  NITROSTAT) 0.4 MG SL tablet Place 1 tablet (0.4 mg total) under the tongue every 5 (five) minutes as needed for chest pain.   Allergies: Allergies as of 04/18/2020 - Review Complete 04/18/2020  Allergen Reaction Noted  . Penicillins Other (See Comments) 12/27/2010   Past Medical History:  Diagnosis Date  . Alcohol abuse 12/27/2010  . Allergic rhinitis, cause unspecified 12/27/2010  . Anal warts 04/11/2012  . CAD, NATIVE VESSEL 10/04/2010  . COPD (chronic obstructive pulmonary disease) (HCC)    "a touch" (09/28/2013)  . History of blood transfusion  03/2012   related to nose bleed  . HYPERLIPIDEMIA-MIXED 10/04/2010  . HYPERTENSION, BENIGN 10/04/2010  . Myocardial infarction (HCC) 09/16/2010  . Pneumonia    "when I was a kid"  . TOBACCO ABUSE 10/04/2010   Family History:  Family History  Problem Relation Age of Onset  . Brain cancer Mother   . Heart disease Father    Social History: -100-pack year smoking history, however down to 7 cigarettes per day. -Past history of significant alcohol use, however several months sober. -Denies recreational drug use  Review of Systems: A complete ROS was negative except as per HPI.   Physical Exam: Blood pressure (!) 132/84, pulse 95, temperature 98.1 F (36.7 C), temperature source Axillary, resp. rate 22, height 5\' 11"  (1.803 m), weight 62.7 kg, SpO2 100 %. Physical Exam Constitutional:      Appearance: He is ill-appearing.  HENT:     Head: Normocephalic and atraumatic.  Eyes:     Extraocular Movements: Extraocular movements intact.  Cardiovascular:     Rate and Rhythm: Normal rate and regular rhythm.     Heart sounds: No murmur heard.  No friction rub. No gallop.   Pulmonary:     Effort: Accessory muscle usage and respiratory distress present.     Breath sounds: Decreased breath sounds and wheezing present.  Abdominal:     General: Bowel sounds are normal.     Palpations: Abdomen is soft.     Tenderness: There is no abdominal tenderness.  Musculoskeletal:        General: Normal range of motion.     Cervical back: Normal range of motion and neck supple.     Right lower leg: Edema present.     Left lower leg: Edema present.     Comments: 1+ pitting edema of bilateral lower extremities  Skin:    General: Skin is warm and dry.  Neurological:     Mental Status: He is alert and oriented to person, place, and time.  Psychiatric:        Mood and Affect: Mood is anxious.    EKG: personally reviewed my interpretation is sinus tachycardia  CXR: personally reviewed my  interpretation is chronic pulmonary disease with no new infiltrates. CT Head WO Contrast: personally reviewed my interpretation is no acute abnormality.  Assessment & Plan by Problem: Active Problems:   Acute respiratory failure with hypercapnia [facility])  Mr. Levon Boettcher. Ivie is a 64 year old man with past medical history significant for tobacco dependence, COPD (on 3L O2), CAD (s/p stent x2), HTN, HLD, and prior history of alcohol abuse who presented to [facility] on 07/27 with confusion found to have acute on chronic hypoxic and hypercapnic respiratory failure in the setting of an acute COPD exacerbation.  #Acute on Chronic Hypoxic and Hypercapnic Respiratory Failure #Acute COPD exacerbation #Pulmonary artery hypertension Patient with history of severe COPD requiring 3L O2 at home, now presenting with worsening dyspnea requiring BiPAP.  ABG consistent with primary respiratory acidosis. Patient chronically retains CO2, however hypercarbia is significantly elevated. PCCM initially consulted and recommended treating as COPD exacerbation with bronchodilators and steroids. He is not on BiPAP at home, however due to the progression of his chronic disease he will likely need to continue with BiPAP upon discharge. -PCCM consulted, appreciate recommendations:   -Continuous BiPAP overnight then convert to nocturnal BiPAP (expect several nights to correct hypercarbia)   -Do not aim for normocapnia as patient is a chronic retainer, assess mental status and pH as measures of improvement.   -Baseline 3L O2 via [place] once off BiPAP   -Empiric doxycycline   -albuterol Q3H PRN   -duoneb Q6H   -Solumedrol 80mg  Q8H   -ABG in AM to assess whether he qualifies for home BiPAP. -BMP in AM -Pulmicort 0.5mg  nebulizer bid  #Acute encephalopathy Progressively worsening mentation likely related to patient's significant hypercapnia. CT Head conducted to rule out alternative etiologies, which was forunately unremarkable.  History of significant alcohol use, however he has been sober for several months. Ammonia within normal limits. Mentation improving significantly according to girlfriend after initiation of BiPAP. -Correct hypercapnia per above  #Palpitations #CAD s/p stents x2 #HFpEF Patient reports multiple episodes of unprovoked palpitations over the past several months. History of significant cardiac disease and HFpEF. Has mild pitting edema of the bilateral lower extremities on exam in the setting of nonadherence to lasix for several days. -Echocardiogram -Lasix 40mg  daily -Cardiac monitoring -BNP -Continue home aspirin 81  #Tobacco dependence Patient has greater than 100 pack year history of tobacco use, however he has recently cut back to seven cigarettes per day. -Smoking cessation assistance -Nicotine patch 7mg  -Started wellbutrin  #Hypercalcemia Calcium of 10.4. 100-pack year history of tobacco use, so hypercalcemia possibly paraneoplastic. -BMP   -If persistently hypercalcemic, consider PTHrP and Vitamin D  #Hyperlipidemia -Continue home lipitor  #Diet: NPO #VTE ppx: Lovenox 40 daily #IVF: none #Code status: Full code  Dispo: Admit patient to Inpatient with expected length of stay greater than 2 midnights.  Signed: , MD 04/19/2020, 12:10 AM  Pager: [phone] After 5pm on weekdays and 1pm on weekends: On Call pager: [phone]

## 2020-04-18 NOTE — Consult Note (Signed)
NAME:  John [name], MRN:  [id number], DOB:  May 05, 1956, LOS: 0 ADMISSION DATE:  04/18/2020, CONSULTATION DATE:  04/18/20 REFERRING MD:  Deretha Emory  CHIEF COMPLAINT:  SOB, confusion   Brief History   John [name] is a 64 y.o. male who was admitted 7/27 with acute on chronic hypoxic and hypercapnic respiratory failure requiring BiPAP.  PCCM asked to see in consultation.  History of present illness   John [name] is a 64 y.o. male who has a PMH including but not limited to tobacco dependence and COPD on 3L chronic O2 and followed by Dr. Sherene Sires (see "past medical history" for rest).  He presented to [facility] ED 7/27 with SOB and confusion.  Girlfriend states symptoms started 3 - 4 days prior and "he just wasn't acting right".  She was adamant that he come to ED for further evaluation.  In ED, he was found to have AoC hypoxic and hypercapnic respiratory failure with initial pCO2 in high 90s (per RN, results not visible in Epic).  He was placed on BiPAP at 18/6 and PCCM was asked to see in consultation.  At the time of my evaluation, he is alert and conversant.  Girlfriend states he definitely looks better then he did when he came in.  Pt able to answer basic questions appropriately and is moving all extremities.  Repeat ABG pending.  He also has CT head pending due to confusion and after pt request for CT to ensure he didn't have intracranial process (a family member of his died from brain tumor per girlfriends report).  Past Medical History  has HYPERLIPIDEMIA-MIXED; Cigarette smoker; HYPERTENSION, BENIGN; CAD, NATIVE VESSEL; Preventative health care; Alcohol abuse; Allergic rhinitis, cause unspecified; Dyspnea; Hemorrhagic shock (HCC); Epistaxis; Stented coronary artery; Anal warts; Unstable angina (HCC); COPD MZ/ GOLD II spirometry  but 02 dep/ hypercarbic as of 11/2019; Exercise hypoxemia; Medication management; Sleeping difficulties; Chronic respiratory failure with hypoxia (HCC); and  Chronic respiratory failure with hypoxia and hypercapnia (HCC) on their problem list.  Significant Hospital Events   7/27 > admit.  Consults:  None.  Procedures:  None.  Significant Diagnostic Tests:  CT head 7/27 >   Micro Data:  COVID 7/27 > neg.  Antimicrobials:  Ceftriaxone 7/27 >    Interim history/subjective:  Comfortable on BiPAP.  Objective:  Blood pressure (!) 145/73, pulse 99, temperature 98.6 F (37 C), temperature source Oral, resp. rate (!) 39, height 5\' 11"  (1.803 m), weight 64.4 kg, SpO2 98 %.       No intake or output data in the 24 hours ending 04/18/20 2103 Filed Weights   04/18/20 1203  Weight: 64.4 kg    Examination: General: Adult male, frail, in NAD. Neuro: A&O x 3, no deficits. HEENT: [place]/AT. Sclerae anicteric.  EOMI.  BiPAP in place. Cardiovascular: RRR, no M/R/G.  Lungs: Respirations even and unlabored.  CTA bilaterally, No W/R/R.  Abdomen: BS x 4, soft, NT/ND.  Musculoskeletal: No gross deformities, no edema.  Skin: Intact, warm, no rashes.  Assessment & Plan:   AoC hypoxic and hypercapnic respiratory failure - likely 2/2 AECOPD. - Continue BiPAP for now. - Baseline 3L O2 via  once off BiPAp. - F/u on repeat ABG. - Do not aim for normocapnia as pt is a chronic retainer.  Rather, use mental status and pH (if ABG obtained) as measures. - Empiric doxycycline. - BD's. - Needs early morning resting ABG to assess whether he qualifies for home BiPAP. - OK for SDU admission  under TRH.  Rest per primary team.   Labs   CBC: Recent Labs  Lab 04/18/20 1231  WBC 7.6  HGB 13.3  HCT 43.5  MCV 110.4*  PLT 143*   Basic Metabolic Panel: Recent Labs  Lab 04/18/20 1231  NA 139  K 4.1  CL 82*  CO2 >50*  GLUCOSE 157*  BUN 14  CREATININE 0.80  CALCIUM 10.4*   GFR: Estimated Creatinine Clearance: 87.2 mL/min (by C-G formula based on SCr of 0.8 mg/dL). Recent Labs  Lab 04/18/20 1231  WBC 7.6   Liver Function Tests: Recent  Labs  Lab 04/18/20 1655  AST 27  ALT 20  ALKPHOS 107  BILITOT 0.7  PROT 6.0*  ALBUMIN 3.3*   No results for input(s): LIPASE, AMYLASE in the last 168 hours. Recent Labs  Lab 04/18/20 1700  AMMONIA 31   ABG    Component Value Date/Time   TCO2 29 04/07/2012 0842    Coagulation Profile: No results for input(s): INR, PROTIME in the last 168 hours. Cardiac Enzymes: No results for input(s): CKTOTAL, CKMB, CKMBINDEX, TROPONINI in the last 168 hours. HbA1C: Hgb A1c MFr Bld  Date/Time Value Ref Range Status  09/18/2010 01:50 AM (H) <5.7 % Final   5.7 (NOTE)                                                                       According to the ADA Clinical Practice Recommendations for 2011, when HbA1c is used as a screening test:   >=6.5%   Diagnostic of Diabetes Mellitus           (if abnormal result  is confirmed)  5.7-6.4%   Increased risk of developing Diabetes Mellitus  References:Diagnosis and Classification of Diabetes Mellitus,Diabetes Care,2011,34(Suppl 1):S62-S69 and Standards of Medical Care in         Diabetes - 2011,Diabetes Care,2011,34  (Suppl 1):S11-S61.   CBG: No results for input(s): GLUCAP in the last 168 hours.  Review of Systems:   All negative; except for those that are bolded, which indicate positives.  Constitutional: weight loss, weight gain, night sweats, fevers, chills, fatigue, weakness.  HEENT: headaches, sore throat, sneezing, nasal congestion, post nasal drip, difficulty swallowing, tooth/dental problems, visual complaints, visual changes, ear aches. Neuro: difficulty with speech, weakness, numbness, ataxia, confusion. CV:  chest pain, orthopnea, PND, swelling in lower extremities, dizziness, palpitations, syncope.  Resp: cough, hemoptysis, dyspnea, wheezing. GI: heartburn, indigestion, abdominal pain, nausea, vomiting, diarrhea, constipation, change in bowel habits, loss of appetite, hematemesis, melena, hematochezia.  GU: dysuria, change in color  of urine, urgency or frequency, flank pain, hematuria. MSK: joint pain or swelling, decreased range of motion. Psych: change in mood or affect, depression, anxiety, suicidal ideations, homicidal ideations. Skin: rash, itching, bruising.   Past medical history  He,  has a past medical history of Alcohol abuse (12/27/2010), Allergic rhinitis, cause unspecified (12/27/2010), Anal warts (04/11/2012), CAD, NATIVE VESSEL (10/04/2010), COPD (chronic obstructive pulmonary disease) (HCC), History of blood transfusion (03/2012), HYPERLIPIDEMIA-MIXED (10/04/2010), HYPERTENSION, BENIGN (10/04/2010), Myocardial infarction (HCC) (09/16/2010), Pneumonia, and TOBACCO ABUSE (10/04/2010).   Surgical History    Past Surgical History:  Procedure Laterality Date  . CARDIAC CATHETERIZATION  ~ 2012  . CORONARY ANGIOPLASTY WITH STENT PLACEMENT  09/17/2010; 09/28/2014   "1; 2"  . FINGER SURGERY Left    "almost cut off" tip of 2nd digit  . LEFT HEART CATHETERIZATION WITH CORONARY ANGIOGRAM N/A 09/28/2014   Procedure: LEFT HEART CATHETERIZATION WITH CORONARY ANGIOGRAM;  Surgeon: Kathleene Hazel, MD;  Location: [facility] CATH LAB;  Service: Cardiovascular;  Laterality: N/A;  . NASAL ENDOSCOPY WITH EPISTAXIS CONTROL Bilateral 03/2012  . VASECTOMY       Social History   reports that he has been smoking cigarettes. He has a 100.00 pack-year smoking history. He has never used smokeless tobacco. He reports current alcohol use of about 144.0 standard drinks of alcohol per week. He reports that he does not use drugs.   Family history   His family history includes Brain cancer in his mother; Heart disease in his father.   Allergies Allergies  Allergen Reactions  . Penicillins Other (See Comments)    Passes out     Home meds  Prior to Admission medications   Medication Sig Start Date End Date Taking? Authorizing Provider  albuterol (PROAIR HFA) 108 (90 Base) MCG/ACT inhaler 2 puffs every 4 hours as needed only  if your can't  catch your breath 03/09/20  Yes Nyoka Cowden, MD  aspirin EC 81 MG tablet Take 81 mg by mouth daily.   Yes [provider]  atorvastatin (LIPITOR) 40 MG tablet Take 1 tablet (40 mg total) by mouth daily. 04/15/19 04/18/20 Yes Kathleene Hazel, MD  Budeson-Glycopyrrol-Formoterol (BREZTRI AEROSPHERE) 160-9-4.8 MCG/ACT AERO Inhale 2 puffs into the lungs in the morning and at bedtime. 02/22/20  Yes Nyoka Cowden, MD  furosemide (LASIX) 40 MG tablet Take 1 tablet (40 mg total) by mouth 2 (two) times daily. Patient taking differently: Take 40 mg by mouth daily.  11/24/19  Yes Kathleene Hazel, MD  Multiple Vitamin (MULTIVITAMIN WITH MINERALS) TABS tablet Take 1 tablet by mouth daily.   Yes [provider]  nitroGLYCERIN (NITROSTAT) 0.4 MG SL tablet Place 1 tablet (0.4 mg total) under the tongue every 5 (five) minutes as needed for chest pain. 11/24/19  Yes Kathleene Hazel, MD     Rutherford Guys, [place] [facility] 04/18/2020, 9:03 PM

## 2020-04-18 NOTE — Telephone Encounter (Addendum)
Pt daughter called but no available DPR... she gave me the pt number to call.. I spoke with him and his girlfriend. He reports that for several weeks his HR has been going up and down at rest and with exertion. He has some SOB with it but after he rests for a while he has some improvement.   He denies chest pain, had a "dizzy episode" one time about a month ago. But, has not had any presyncope or syncope since that one episode that only lasted a few seconds.   Pt girlfriend had me on speakerphone and she reported the pt has been having some "confusion" since this past Saturday (3 days). He has been hearing things and thinks his daughter has been in the driveway when actually she has not been there.  He denies that this has been a problem and asked me to forget that I heard that since he does not feel this has been a problem for him.   He denies headache, weakness on one side, no slurred speech, and no facial drooping.   He denies any urination issues but has been constipated for quite a while. He says he does not have an appetite so he has not been eating but has been drinking fluid well all day every day. No fever, no cough.   He reports his BP has been 135-155/ 85-100 but does not have any specific readings recorded to report.   He just says that he wants to get his heart "regulated' but his gf says that she would like to take him to the hospital. She says he is not "acting like the man she met several years ago". I asked what she means by that but she says he just does not seem to be himself. No hallucinations but just the "confusion episodes the last few days". He has been acting paranoid and appears very tired according to her.   He does not have a PCP... he sees Dr. Melvyn Novas for his COPD and his wife reports he is not using his inhalers.   I have urged them to go the ED for assessment especially with no PCP able to call. They have agreed and will take him now.    Will forward to Dr. Angelena Form  for his review. He has not seen the pt since 03/2019.

## 2020-04-19 ENCOUNTER — Inpatient Hospital Stay (HOSPITAL_COMMUNITY): Payer: Self-pay

## 2020-04-19 DIAGNOSIS — J9602 Acute respiratory failure with hypercapnia: Secondary | ICD-10-CM

## 2020-04-19 DIAGNOSIS — E43 Unspecified severe protein-calorie malnutrition: Secondary | ICD-10-CM | POA: Insufficient documentation

## 2020-04-19 DIAGNOSIS — J9622 Acute and chronic respiratory failure with hypercapnia: Secondary | ICD-10-CM

## 2020-04-19 LAB — BLOOD GAS, ARTERIAL
Acid-Base Excess: 20.8 mmol/L — ABNORMAL HIGH (ref 0.0–2.0)
Acid-Base Excess: 20.1 mmol/L — ABNORMAL HIGH (ref 0.0–2.0)
Bicarbonate: 47.3 mmol/L — ABNORMAL HIGH (ref 20.0–28.0)
Bicarbonate: 46.2 mmol/L — ABNORMAL HIGH (ref 20.0–28.0)
Drawn by: 205171
FIO2: 35
FIO2: 35
O2 Saturation: 96.1 %
O2 Saturation: 95.6 %
Patient temperature: 36.6
Patient temperature: 36
pCO2 arterial: 81.4 mmHg (ref 32.0–48.0)
pCO2 arterial: 71.4 mmHg (ref 32.0–48.0)
pH, Arterial: 7.38 (ref 7.350–7.450)
pH, Arterial: 7.421 (ref 7.350–7.450)
pO2, Arterial: 74.7 mmHg — ABNORMAL LOW (ref 83.0–108.0)
pO2, Arterial: 67.6 mmHg — ABNORMAL LOW (ref 83.0–108.0)

## 2020-04-19 LAB — CBC WITH DIFFERENTIAL/PLATELET
Abs Immature Granulocytes: 0.02 10*3/uL (ref 0.00–0.07)
Basophils Absolute: 0 10*3/uL (ref 0.0–0.1)
Basophils Relative: 0 %
Eosinophils Absolute: 0 10*3/uL (ref 0.0–0.5)
Eosinophils Relative: 0 %
HCT: 42.5 % (ref 39.0–52.0)
Hemoglobin: 13.6 g/dL (ref 13.0–17.0)
Immature Granulocytes: 0 %
Lymphocytes Relative: 6 %
Lymphs Abs: 0.5 10*3/uL — ABNORMAL LOW (ref 0.7–4.0)
MCH: 34.8 pg — ABNORMAL HIGH (ref 26.0–34.0)
MCHC: 32 g/dL (ref 30.0–36.0)
MCV: 108.7 fL — ABNORMAL HIGH (ref 80.0–100.0)
Monocytes Absolute: 0.2 10*3/uL (ref 0.1–1.0)
Monocytes Relative: 2 %
Neutro Abs: 7.5 10*3/uL (ref 1.7–7.7)
Neutrophils Relative %: 92 %
Platelets: 139 10*3/uL — ABNORMAL LOW (ref 150–400)
RBC: 3.91 MIL/uL — ABNORMAL LOW (ref 4.22–5.81)
RDW: 12.3 % (ref 11.5–15.5)
WBC: 8.2 10*3/uL (ref 4.0–10.5)
nRBC: 0 % (ref 0.0–0.2)

## 2020-04-19 LAB — RAPID URINE DRUG SCREEN, HOSP PERFORMED
Amphetamines: NOT DETECTED
Barbiturates: NOT DETECTED
Benzodiazepines: POSITIVE — AB
Cocaine: NOT DETECTED
Opiates: NOT DETECTED
Tetrahydrocannabinol: NOT DETECTED

## 2020-04-19 LAB — BASIC METABOLIC PANEL
Anion gap: 10 (ref 5–15)
Anion gap: 8 (ref 5–15)
BUN: 18 mg/dL (ref 8–23)
BUN: 18 mg/dL (ref 8–23)
CO2: 48 mmol/L — ABNORMAL HIGH (ref 22–32)
CO2: 48 mmol/L — ABNORMAL HIGH (ref 22–32)
Calcium: 10.4 mg/dL — ABNORMAL HIGH (ref 8.9–10.3)
Calcium: 10.5 mg/dL — ABNORMAL HIGH (ref 8.9–10.3)
Chloride: 83 mmol/L — ABNORMAL LOW (ref 98–111)
Chloride: 85 mmol/L — ABNORMAL LOW (ref 98–111)
Creatinine, Ser: 0.85 mg/dL (ref 0.61–1.24)
Creatinine, Ser: 0.92 mg/dL (ref 0.61–1.24)
GFR calc Af Amer: >60 mL/min (ref >60)
GFR calc Af Amer: >60 mL/min (ref >60)
GFR calc non Af Amer: >60 mL/min (ref >60)
GFR calc non Af Amer: >60 mL/min (ref >60)
Glucose, Bld: 102 mg/dL — ABNORMAL HIGH (ref 70–99)
Glucose, Bld: 109 mg/dL — ABNORMAL HIGH (ref 70–99)
Potassium: 4.7 mmol/L (ref 3.5–5.1)
Potassium: 4.9 mmol/L (ref 3.5–5.1)
Sodium: 141 mmol/L (ref 135–145)
Sodium: 141 mmol/L (ref 135–145)

## 2020-04-19 LAB — URINALYSIS, ROUTINE W REFLEX MICROSCOPIC
Bacteria, UA: NONE SEEN
Bilirubin Urine: NEGATIVE
Glucose, UA: NEGATIVE mg/dL
Hgb urine dipstick: NEGATIVE
Ketones, ur: 20 mg/dL — AB
Leukocytes,Ua: NEGATIVE
Nitrite: NEGATIVE
Protein, ur: 30 mg/dL — AB
Specific Gravity, Urine: 1.016 (ref 1.005–1.030)
pH: 8 (ref 5.0–8.0)

## 2020-04-19 LAB — COMPREHENSIVE METABOLIC PANEL
ALT: 25 U/L (ref 0–44)
AST: 28 U/L (ref 15–41)
Albumin: 3.6 g/dL (ref 3.5–5.0)
Alkaline Phosphatase: 105 U/L (ref 38–126)
Anion gap: 8 (ref 5–15)
BUN: 25 mg/dL — ABNORMAL HIGH (ref 8–23)
CO2: 46 mmol/L — ABNORMAL HIGH (ref 22–32)
Calcium: 10.2 mg/dL (ref 8.9–10.3)
Chloride: 85 mmol/L — ABNORMAL LOW (ref 98–111)
Creatinine, Ser: 1.17 mg/dL (ref 0.61–1.24)
GFR calc Af Amer: >60 mL/min (ref >60)
GFR calc non Af Amer: >60 mL/min (ref >60)
Glucose, Bld: 139 mg/dL — ABNORMAL HIGH (ref 70–99)
Potassium: 4.5 mmol/L (ref 3.5–5.1)
Sodium: 139 mmol/L (ref 135–145)
Total Bilirubin: 0.6 mg/dL (ref 0.3–1.2)
Total Protein: 6.3 g/dL — ABNORMAL LOW (ref 6.5–8.1)

## 2020-04-19 LAB — GLUCOSE, CAPILLARY
Glucose-Capillary: 111 mg/dL — ABNORMAL HIGH (ref 70–99)
Glucose-Capillary: 115 mg/dL — ABNORMAL HIGH (ref 70–99)
Glucose-Capillary: 150 mg/dL — ABNORMAL HIGH (ref 70–99)
Glucose-Capillary: 158 mg/dL — ABNORMAL HIGH (ref 70–99)
Glucose-Capillary: 170 mg/dL — ABNORMAL HIGH (ref 70–99)

## 2020-04-19 LAB — POCT I-STAT 7, (LYTES, BLD GAS, ICA,H+H)
Acid-Base Excess: 20 mmol/L — ABNORMAL HIGH (ref 0.0–2.0)
Bicarbonate: 44.1 mmol/L — ABNORMAL HIGH (ref 20.0–28.0)
Calcium, Ion: 1.14 mmol/L — ABNORMAL LOW (ref 1.15–1.40)
HCT: 37 % — ABNORMAL LOW (ref 39.0–52.0)
Hemoglobin: 12.6 g/dL — ABNORMAL LOW (ref 13.0–17.0)
O2 Saturation: 100 %
Patient temperature: 98.1
Potassium: 4.2 mmol/L (ref 3.5–5.1)
Sodium: 137 mmol/L (ref 135–145)
TCO2: 45 mmol/L — ABNORMAL HIGH (ref 22–32)
pCO2 arterial: 44.9 mmHg (ref 32.0–48.0)
pH, Arterial: 7.599 — ABNORMAL HIGH (ref 7.350–7.450)
pO2, Arterial: 174 mmHg — ABNORMAL HIGH (ref 83.0–108.0)

## 2020-04-19 LAB — BRAIN NATRIURETIC PEPTIDE: B Natriuretic Peptide: 45.6 pg/mL (ref 0.0–100.0)

## 2020-04-19 LAB — MAGNESIUM
Magnesium: 1.8 mg/dL (ref 1.7–2.4)
Magnesium: 1.9 mg/dL (ref 1.7–2.4)
Magnesium: 2.1 mg/dL (ref 1.7–2.4)

## 2020-04-19 LAB — VITAMIN D 25 HYDROXY (VIT D DEFICIENCY, FRACTURES): Vit D, 25-Hydroxy: 22.08 ng/mL — ABNORMAL LOW (ref 30–100)

## 2020-04-19 LAB — ECHOCARDIOGRAM COMPLETE
Area-P 1/2: 3.68 cm2
Height: 71 in
S' Lateral: 2.4 cm
Weight: 2345.69 oz

## 2020-04-19 LAB — PHOSPHORUS
Phosphorus: 1.7 mg/dL — ABNORMAL LOW (ref 2.5–4.6)
Phosphorus: 2.3 mg/dL — ABNORMAL LOW (ref 2.5–4.6)
Phosphorus: 2.8 mg/dL (ref 2.5–4.6)

## 2020-04-19 LAB — TSH: TSH: 0.688 u[IU]/mL (ref 0.350–4.500)

## 2020-04-19 LAB — HIV ANTIBODY (ROUTINE TESTING W REFLEX): HIV Screen 4th Generation wRfx: NONREACTIVE

## 2020-04-19 MED ORDER — ACETAMINOPHEN 325 MG PO TABS: 650.0000 mg | ORAL_TABLET | Freq: Four times a day (QID) | ORAL | Status: DC | PRN

## 2020-04-19 MED ORDER — FOLIC ACID 1 MG PO TABS
1.0000 mg | ORAL_TABLET | Freq: Every day | ORAL | Status: DC
  Administered 2020-04-20 – 2020-04-23 (×4): 1 mg
  Filled 2020-04-19 (×4): qty 1

## 2020-04-19 MED ORDER — DEXMEDETOMIDINE HCL IN NACL 400 MCG/100ML IV SOLN
0.0000 ug/kg/h | INTRAVENOUS | Status: AC
  Administered 2020-04-19: 1.2 ug/kg/h via INTRAVENOUS
  Administered 2020-04-19: 1.1 ug/kg/h via INTRAVENOUS
  Administered 2020-04-19: 0.5 ug/kg/h via INTRAVENOUS
  Administered 2020-04-20: 1 ug/kg/h via INTRAVENOUS
  Administered 2020-04-20 (×2): 0.9 ug/kg/h via INTRAVENOUS
  Administered 2020-04-21: 1 ug/kg/h via INTRAVENOUS
  Administered 2020-04-21: 0.7 ug/kg/h via INTRAVENOUS
  Administered 2020-04-21: 1 ug/kg/h via INTRAVENOUS
  Administered 2020-04-22: 0.6 ug/kg/h via INTRAVENOUS
  Filled 2020-04-19 (×11): qty 100

## 2020-04-19 MED ORDER — PROSOURCE TF PO LIQD: 45.0000 mL | Freq: Two times a day (BID) | ORAL | Status: DC

## 2020-04-19 MED ORDER — PHENYLEPHRINE HCL-NACL 10-0.9 MG/250ML-% IV SOLN: 0.0000 ug/min | INTRAVENOUS | Status: DC

## 2020-04-19 MED ORDER — FENTANYL CITRATE (PF) 100 MCG/2ML IJ SOLN: 100.0000 ug | Freq: Once | INTRAMUSCULAR | Status: AC

## 2020-04-19 MED ORDER — PANTOPRAZOLE SODIUM 40 MG PO PACK
40.0000 mg | PACK | ORAL | Status: DC
  Administered 2020-04-19 – 2020-04-21 (×3): 40 mg
  Filled 2020-04-19 (×4): qty 20

## 2020-04-19 MED ORDER — ORAL CARE MOUTH RINSE: 15.0000 mL | Freq: Two times a day (BID) | OROMUCOSAL | Status: DC

## 2020-04-19 MED ORDER — DOCUSATE SODIUM 50 MG/5ML PO LIQD
100.0000 mg | Freq: Two times a day (BID) | ORAL | Status: DC
  Administered 2020-04-19 – 2020-04-22 (×6): 100 mg
  Filled 2020-04-19 (×6): qty 10

## 2020-04-19 MED ORDER — ADULT MULTIVITAMIN W/MINERALS CH
1.0000 | ORAL_TABLET | Freq: Every day | ORAL | Status: DC
  Administered 2020-04-20 – 2020-04-23 (×4): 1
  Filled 2020-04-19 (×4): qty 1

## 2020-04-19 MED ORDER — FENTANYL CITRATE (PF) 100 MCG/2ML IJ SOLN
50.0000 ug | INTRAMUSCULAR | Status: DC | PRN
Start: 2020-04-19 — End: 2020-04-19

## 2020-04-19 MED ORDER — VITAL AF 1.2 CAL PO LIQD
1000.0000 mL | ORAL | Status: DC
  Administered 2020-04-19 – 2020-04-21 (×3): 1000 mL

## 2020-04-19 MED ORDER — DEXMEDETOMIDINE HCL IN NACL 400 MCG/100ML IV SOLN: 0.4000 ug/kg/h | INTRAVENOUS | Status: DC

## 2020-04-19 MED ORDER — CHLORHEXIDINE GLUCONATE 0.12 % MT SOLN: 15.0000 mL | Freq: Two times a day (BID) | OROMUCOSAL | Status: DC

## 2020-04-19 MED ORDER — LACTATED RINGERS IV SOLN: INTRAVENOUS | Status: DC

## 2020-04-19 MED ORDER — MIDAZOLAM HCL 2 MG/2ML IJ SOLN
INTRAMUSCULAR | Status: AC
  Administered 2020-04-19: 2 mg via INTRAVENOUS
  Filled 2020-04-19: qty 2

## 2020-04-19 MED ORDER — MAGNESIUM SULFATE 2 GM/50ML IV SOLN
2.0000 g | Freq: Once | INTRAVENOUS | Status: AC
  Administered 2020-04-19: 2 g via INTRAVENOUS
  Filled 2020-04-19: qty 50

## 2020-04-19 MED ORDER — CHLORHEXIDINE GLUCONATE 0.12% ORAL RINSE (MEDLINE KIT)
15.0000 mL | Freq: Two times a day (BID) | OROMUCOSAL | Status: DC
  Administered 2020-04-19 – 2020-04-23 (×9): 15 mL via OROMUCOSAL

## 2020-04-19 MED ORDER — IPRATROPIUM-ALBUTEROL 0.5-2.5 (3) MG/3ML IN SOLN
3.0000 mL | Freq: Four times a day (QID) | RESPIRATORY_TRACT | Status: DC
  Administered 2020-04-19: 3 mL via RESPIRATORY_TRACT
  Filled 2020-04-19: qty 3

## 2020-04-19 MED ORDER — MIDAZOLAM HCL 2 MG/2ML IJ SOLN: 2.0000 mg | Freq: Once | INTRAMUSCULAR | Status: AC

## 2020-04-19 MED ORDER — ROCURONIUM BROMIDE 50 MG/5ML IV SOLN
70.0000 mg | Freq: Once | INTRAVENOUS | Status: AC
  Administered 2020-04-19: 70 mg via INTRAVENOUS

## 2020-04-19 MED ORDER — SODIUM CHLORIDE 0.9 % IV SOLN
INTRAVENOUS | Status: DC | PRN
  Administered 2020-04-19: 250 mL via INTRAVENOUS
  Administered 2020-04-21: 500 mL via INTRAVENOUS

## 2020-04-19 MED ORDER — CHLORHEXIDINE GLUCONATE CLOTH 2 % EX PADS
6.0000 | MEDICATED_PAD | Freq: Every day | CUTANEOUS | Status: DC
  Administered 2020-04-19 – 2020-04-25 (×7): 6 via TOPICAL

## 2020-04-19 MED ORDER — POLYETHYLENE GLYCOL 3350 17 G PO PACK
17.0000 g | PACK | Freq: Every day | ORAL | Status: DC
  Administered 2020-04-20 – 2020-04-23 (×4): 17 g
  Filled 2020-04-19 (×4): qty 1

## 2020-04-19 MED ORDER — NOREPINEPHRINE 4 MG/250ML-% IV SOLN
INTRAVENOUS | Status: AC
  Filled 2020-04-19: qty 250

## 2020-04-19 MED ORDER — MIDAZOLAM HCL 2 MG/2ML IJ SOLN
2.0000 mg | INTRAMUSCULAR | Status: DC | PRN
  Administered 2020-04-19 – 2020-04-20 (×3): 2 mg via INTRAVENOUS
  Administered 2020-04-20: 1 mg via INTRAVENOUS
  Administered 2020-04-20 – 2020-04-22 (×5): 2 mg via INTRAVENOUS
  Filled 2020-04-19 (×9): qty 2

## 2020-04-19 MED ORDER — FENTANYL CITRATE (PF) 100 MCG/2ML IJ SOLN
50.0000 ug | INTRAMUSCULAR | Status: DC | PRN
  Administered 2020-04-19 – 2020-04-20 (×3): 100 ug via INTRAVENOUS
  Filled 2020-04-19 (×4): qty 2

## 2020-04-19 MED ORDER — ETOMIDATE 2 MG/ML IV SOLN
20.0000 mg | Freq: Once | INTRAVENOUS | Status: AC
  Administered 2020-04-19: 20 mg via INTRAVENOUS

## 2020-04-19 MED ORDER — VITAMIN B-12 100 MCG PO TABS
100.0000 ug | ORAL_TABLET | Freq: Every day | ORAL | Status: DC
  Filled 2020-04-19: qty 1

## 2020-04-19 MED ORDER — FENTANYL CITRATE (PF) 100 MCG/2ML IJ SOLN
INTRAMUSCULAR | Status: AC
  Administered 2020-04-19: 100 ug via INTRAVENOUS
  Filled 2020-04-19: qty 2

## 2020-04-19 MED ORDER — SODIUM CHLORIDE 0.9 % IV BOLUS
1000.0000 mL | Freq: Once | INTRAVENOUS | Status: AC
  Administered 2020-04-19: 1000 mL via INTRAVENOUS

## 2020-04-19 MED ORDER — FUROSEMIDE 40 MG PO TABS: 40.0000 mg | ORAL_TABLET | Freq: Every day | ORAL | Status: DC

## 2020-04-19 MED ORDER — ATORVASTATIN CALCIUM 40 MG PO TABS
40.0000 mg | ORAL_TABLET | Freq: Every day | ORAL | Status: DC
  Administered 2020-04-20 – 2020-04-23 (×4): 40 mg
  Filled 2020-04-19 (×4): qty 1

## 2020-04-19 MED ORDER — ORAL CARE MOUTH RINSE
15.0000 mL | OROMUCOSAL | Status: DC
  Administered 2020-04-19 – 2020-04-22 (×29): 15 mL via OROMUCOSAL

## 2020-04-19 MED ORDER — NOREPINEPHRINE 4 MG/250ML-% IV SOLN
0.0000 ug/min | INTRAVENOUS | Status: DC
  Administered 2020-04-19: 8 ug/min via INTRAVENOUS

## 2020-04-19 MED ORDER — LACTATED RINGERS IV BOLUS
1000.0000 mL | Freq: Once | INTRAVENOUS | Status: AC
  Administered 2020-04-19: 1000 mL via INTRAVENOUS

## 2020-04-19 MED ORDER — ACETAMINOPHEN 650 MG RE SUPP: 650.0000 mg | Freq: Four times a day (QID) | RECTAL | Status: DC | PRN

## 2020-04-19 MED ORDER — FOLIC ACID 1 MG PO TABS: 1.0000 mg | ORAL_TABLET | Freq: Every day | ORAL | Status: DC

## 2020-04-19 MED ORDER — VITAL HIGH PROTEIN PO LIQD: 1000.0000 mL | ORAL | Status: DC

## 2020-04-19 MED ORDER — LACTATED RINGERS IV BOLUS: 500.0000 mL | Freq: Once | INTRAVENOUS | Status: DC

## 2020-04-19 MED ORDER — MIDAZOLAM HCL 2 MG/2ML IJ SOLN: 2.0000 mg | INTRAMUSCULAR | Status: DC | PRN

## 2020-04-19 MED ORDER — IPRATROPIUM-ALBUTEROL 0.5-2.5 (3) MG/3ML IN SOLN
3.0000 mL | Freq: Four times a day (QID) | RESPIRATORY_TRACT | Status: DC
  Administered 2020-04-19 – 2020-04-24 (×18): 3 mL via RESPIRATORY_TRACT
  Filled 2020-04-19 (×19): qty 3

## 2020-04-19 MED ORDER — VITAMIN B-12 100 MCG PO TABS
100.0000 ug | ORAL_TABLET | Freq: Every day | ORAL | Status: DC
  Administered 2020-04-20 – 2020-04-23 (×4): 100 ug
  Filled 2020-04-19 (×4): qty 1

## 2020-04-19 NOTE — Progress Notes (Signed)
Pt ST with HR low 100's and respirations 25-35 on Bipap. Yellow MEWS protocol initiated. Pt resting comfortably in bed. Will continue to monitor.

## 2020-04-19 NOTE — Progress Notes (Signed)
  Echocardiogram 2D Echocardiogram has been performed.  John [name] 04/19/2020, 3:17 PM

## 2020-04-19 NOTE — Progress Notes (Signed)
Stat  EEG complete - results pending.  

## 2020-04-19 NOTE — Progress Notes (Addendum)
0810:[name], beside RN to charge desk with AMS change on patient--she had called RR and attending team. This RN to bedside- physician team present. Upon arrival VSS, pt on BiPAP, eye open- not tracking. Not following MD commands, no reacting to pain. Pt longtime girlfriend at bedside.   0830: This RN at bedside with family member. Called Rapid Response to update, pt unresponsive to deep sternal rub and nail bed pressure- no movement no eye opening. On BiPAP. GCS- 3. Obtunded. Theodoro Grist, [place] RN has seen to assess patient prior to 0800 when AMS started--states VSS believed to be behavioral. Potential trial off BiPAP if ABG OK.    0849: This RN remained at bedside to comfort family. Awaiting ABG results, currently on BiPAP with intermittent high tidal volume alarm.  1962: EEG techs at bedside. Pt girlfriend stepped out. This RN attempted deep sternal rub again, eyelids fluttered, but never opened eyes. No extremity movement or extension/flexion to pain.  2297: Spontaneously opened eyes momentarily while EEG tech applying electrode.   0901: ABG results in. Delaney Meigs RN notified results to attending team.   Results for John [name], John [name] (MRN [id number]) as of 04/19/2020 09:01  Ref. Range 04/19/2020 08:27  Sample type Unknown ARTERIAL DRAW  FIO2 Unknown 35.00  pH, Arterial Latest Ref Range: 7.35 - 7.45  7.421  pCO2 arterial Latest Ref Range: 32 - 48 mmHg 71.4 (HH)  pO2, Arterial Latest Ref Range: 83 - 108 mmHg 67.6 (L)  Acid-Base Excess Latest Ref Range: 0.0 - 2.0 mmol/L 20.1 (H)  Bicarbonate Latest Ref Range: 20.0 - 28.0 mmol/L 46.2 (H)  O2 Saturation Latest Units: % 95.6  Patient temperature Unknown 36.0  Collection site Unknown LEFT RADIAL  Allens test (pass/fail) Latest Ref Range: PASS  PASS   0913: IMTS at bedside. Neurology at bedside. Potential plan to move to MICU for consistent unresponsiveness and GCS 3. This RN will remain at bedside until transfer--plan to complete EEG before transfer.    1036: called 47M for update on bed transfer, unable to reach charge RN. Will continue to monitor patient. VSS, GCS 3.  1100: CCM at bedside.   1125: Pt son [name]) and daughter [name]) at bedside. Delorise Shiner, NP speaking with family. Bedside RN calling report to [phone]. RT called for transport support.

## 2020-04-19 NOTE — Procedures (Addendum)
Patient Name: DEONTRE ALLSUP  MRN: [id number]  Epilepsy Attending: Charlsie Quest  Referring Physician/Provider: Dr. Tonye Royalty Date: 04/19/2020 Duration: 22.32 mins  Patient history: 64 year old male with altered mental status.  EEG 12 for seizures.  Level of alertness: lethargic  AEDs during EEG study: None  Technical aspects: This EEG study was done with scalp electrodes positioned according to the 10-20 International system of electrode placement. Electrical activity was acquired at a sampling rate of 500Hz  and reviewed with a high frequency filter of 70Hz  and a low frequency filter of 1Hz . EEG data were recorded continuously and digitally stored.   Description: The posterior dominant rhythm consists of 8 Hz activity of moderate voltage (25-35 uV) seen predominantly in posterior head regions, symmetric and reactive to eye opening and eye closing.  EEG showed condyle generalized polymorphic mixed frequencies with predominantly 8 to 9 Hz alpha activity as well as intermittent generalized 3 to 5 Hz theta-delta slowing.  Hyperventilation and photic stimulation were not performed.     ABNORMALITY -Intermittent slow, generalized  IMPRESSION: This study is suggestive of mild diffuse encephalopathy, nonspecific etiology.  No seizures or epileptiform discharges were seen throughout the recording.  Kirston Luty 

## 2020-04-19 NOTE — Progress Notes (Addendum)
Subjective:   Patient admitted overnight and started on BiPAP, doxycycline, steroids, breathing treatments for COPD exacerbation. Had reported improvement in mentation.  This morning, patient was noted to be unresponsive and staring straight forward. On initial exam he followed simple commands such as holding up 2 fingers, but never responded verbally. He became obtunded soon after, not responding to painful stimuli. Oxygen saturation is in uppers 90s on BiPAP.   Objective:  Vital signs in last 24 hours: Vitals:   04/19/20 0416 04/19/20 0721 04/19/20 0728 04/19/20 0734  BP:  123/65    Pulse: 101 88    Resp:  20    Temp:  98 F (36.7 C)    TempSrc:  Oral    SpO2:  94% 93% 96%  Weight:      Height:       ABG    Component Value Date/Time   PHART 7.421 04/19/2020 0827   PCO2ART 71.4 (HH) 04/19/2020 0827   PO2ART 67.6 (L) 04/19/2020 0827   HCO3 46.2 (H) 04/19/2020 0827   TCO2 29 04/07/2012 0842   O2SAT 95.6 04/19/2020 0827    Physical Exam Constitutional: obtunded Head: atraumatic ENT: external ears normal Eyes: PERRL Cardiovascular: regular rate and rhythm, normal heart sounds Pulmonary: on BiPAP, effort normal when briefly removed from BiPAP, lung sounds decreased bilaterally, mild wheezing, no LE edema Abdominal: flat, nontender, no rebound tenderness, bowel sounds normal Musculoskeletal: flaccid Skin: warm and dry Neurological: initially responded to simple commands but became obtunded and unresponsive to painful stimuli, at one point did respond to [place] opening his eyes to perform ocular exam but on repeat exam was unresponsive to this Psychiatric: unable to assess  Assessment/Plan: John [name] is a 64 y.o. male with hx of COPD on 2L O2 at home, 100 pack years smoking history, HLD, HTN, CAD s/p DES in 2011 and 2016 presenting with altered mental status and worsening dyspnea, found to have [place] of 104, treatment for COPD exacerbation initiated. Became obtunded on  morning after admission.  Principal Problem:   Acute respiratory failure with hypercapnia (HCC) Active Problems:   Cigarette smoker   HYPERTENSION, BENIGN   Stented coronary artery   COPD MZ/ GOLD II spirometry  but 02 dep/ hypercarbic as of 11/2019  Acute encephalopathy Patient presented with confusion, but this morning had acute change in mentation, now obtunded with GCS of 3. Vitals benign, including O2 saturation. Normotensive. EEG without evidence of seizures. CT head at admission without acute intracranial abnormalities. We were concerned about acute hypercalcemia, but Ca of 10.2 with albumin 3.6. Suspect hypoxic encephalopathy. ABGs with decreasing pO2, last 67.6. On BiPAP. Currently protecting airway, but concerned this may change. -stat MRI brain without contrast -ABG q4 hours -f/u ionized calcium -f/u urine drug screen -LR 121m/hr -PCCM consulted, f/u recommendations  Hypercapneic and hypoxic respiratory failure COPD exacerbation Initial pCO2 of 104, improving after starting BiPAP here.  -continue BiPAP for now, switch to nocturnal only when improved     -switch to home 3L O2 when off BiPAP     -gauge by mental status and pH, do not aim for normocapnea since he has chronic retention.  -continue doxycycline, day 2 -continue solumedrol 833mTID, step down with clinical improvement -continue duo-neb q 6 hours -continue Pulmicort BID -continue albuterol prn -as above, ABG q4hr and f/u PCCM recs  Mild hypercalcemia Lung nodule Mild hypercalcemia with Ca 10.2 and albumin 3.6. Symptoms consistent with hypercalcemia including confusion, decreased appetite, shortened QT interval. Considering malignant  etiology since he had incidental finding on 20m lung nodule in March 2021 and heavy smoking history. No protein gap to suggest multiple myeloma. -CT chest to f/u lung nodule -daily CMP -f/u PTH, vitamin D, TSH, PTHrp  Palpitations in the setting of chronic lung disease Reports  episodes of heart racing over past few months. Hx of COPD and CAD. No known A fib history. No A fib on EKG, some PACs noted on telemetry. -f/u TTE -telemetry  Tobacco abuse 100 pack year history, reportedly down to 7 cigarettes a day. -continue nicotine patch -Wellbutrin stopped for AMS and concern for seizure  Former alcohol abuse Previously drank a 12-pack per day but has been sober for past few months. No withdrawal symptoms noted. -monitor for withdrawal symptoms  CAD s/p DES x2 HFpEF BNP 40. No LE edema. -home ASA 813m-home Lasix 4067maily -f/u TTE as above   Diet: NPO IVF: LR VTE: Lovenox Prior to Admission Living Arrangement: home Anticipated Discharge Location: home Barriers to Discharge: acutely encephalopathic, respiratory failure Dispo: Anticipated discharge in approximately 2-3 day(s).   CheAndrew AuD 04/19/2020, 7:59 AM Pager: [phone] 5pm on weekdays and 1pm on weekends: On Call pager [phone]

## 2020-04-19 NOTE — Consult Note (Addendum)
NAME:  RITCHARD PARAGAS, MRN:  [id number], DOB:  1956/01/20, LOS: 1 ADMISSION DATE:  04/18/2020, CONSULTATION DATE:  04/19/20 REFERRING MD:  Debe Coder, MD  CHIEF COMPLAINT:  AMS, SOB  Brief History   ARCHER MOIST is a 64 y.o. male who was admitted 7/27 with acute on chronic hypoxic and hypercapnic respiratory failure requiring BiPAP.  PCCM asked to see in consultation due to worsening AMS this morning despite BiPAP.  History of present illness   DOV DILL is a 64 y.o. male who has a PMH including but not limited to tobacco dependence and COPD on 3L chronic O2 and followed by Dr. Sherene Sires (see "past medical history" for rest).  He presented to [facility] ED 7/27 with SOB and confusion.  Girlfriend states symptoms started 3 - 4 days prior and "he just wasn't acting right".  She was adamant that he come to ED for further evaluation.  In ED, he was found to have AoC hypoxic and hypercapnic respiratory failure with initial pCO2 in high 90s (per RN, results not visible in Epic).  He was placed on BiPAP at 18/6 and PCCM was asked to see in consultation. At the time of evaluation, he was alert and conversational.  Girlfriend stated he definitely looks better then he did when he came in.  Pt was able to answer basic questions appropriately and was moving all extremities.    He was admitted to IMTS service on continuous BiPAP overnight and treatment for COPD exacerbation. PCCM consulted this morning due to altered mental status.   Past Medical History  has HYPERLIPIDEMIA-MIXED; Cigarette smoker; HYPERTENSION, BENIGN; CAD, NATIVE VESSEL; Preventative health care; Alcohol abuse; Allergic rhinitis, cause unspecified; Dyspnea; Hemorrhagic shock (HCC); Epistaxis; Stented coronary artery; Anal warts; Unstable angina (HCC); COPD MZ/ GOLD II spirometry  but 02 dep/ hypercarbic as of 11/2019; Exercise hypoxemia; Medication management; Sleeping difficulties; Chronic respiratory failure with hypoxia (HCC); Chronic  respiratory failure with hypoxia and hypercapnia (HCC); and Acute respiratory failure with hypercapnia (HCC) on their problem list.  Significant Hospital Events   7/27 > admit. 7/28 > AMS , transferred to ICU and intubated. Consults:  None.  Procedures:  ETT 7/28   Significant Diagnostic Tests:  CT head 7/27 >  EEG 7/28 : mild diffuse encephalopathy, no seizures or epileptiform discharges.   Micro Data:  COVID 7/27 > neg.  Antimicrobials:  Ceftriaxone 7/27 >  Stopped Doxycycline 7/27 >  Interim history/subjective:  Moved to ICU. Intubated . Continue treatment for COPD.   Objective:  Blood pressure (!) 73/58, pulse (!) 113, temperature 98.1 F (36.7 C), temperature source Axillary, resp. rate 13, height 5\' 11"  (1.803 m), weight 66.5 kg, SpO2 100 %.    Vent Mode: PRVC FiO2 (%):  [35 %-40 %] 40 % Set Rate:  [16 bmp] 16 bmp Vt Set:  [600 mL] 600 mL PEEP:  [5 cmH20] 5 cmH20 Plateau Pressure:  [13 cmH20] 13 cmH20   Intake/Output Summary (Last 24 hours) at 04/19/2020 1345 Last data filed at 04/19/2020 0400 Gross per 24 hour  Intake 325 ml  Output --  Net 325 ml   Filed Weights   04/18/20 1203 04/18/20 2259 04/19/20 1200  Weight: 64.4 kg 62.7 kg 66.5 kg    Examination: General: Adult male, critically ill  Neuro: Not alert or oriented.  HEENT: [place]/AT. Sclerae anicteric. Cardiovascular: RRR, no M/R/G.  Lungs: Bilateral mechanical vent breaths Abdomen: BS+, soft, NT/ND.  Musculoskeletal: No gross deformities, no edema.  Skin: Intact, warm,  no rashes.  Assessment & Plan:  Assessment Severe COPD exacerbation requiring intubation Hypercapnic Encephalopathy  Acute on Chronic Hypoxic and Hypercapnic respiratory failure Hypercalcemia  Tobacco Use  Patient was transiently hypotensive initially but responded to fluids or blood pressure cuff was not accurate. Precedex also started. Seems to adjusted quickly and pressors weaned. Likely not in shock .   Plan:  - Baseline 3L  O2 Nasal Canula at home. Continue full vent support . - follow up ABG in 1 hour  - Continue Doxycycline.  - Continue Solumedrol q8 hrs - Duonebs q6hrs  - Nicotine patch - Precedex , PRN Fentanyl and Versed to maintain RASS 0 to -1 - F/u PTH, Vit D, TSH, PTH rp - PRN Levo - pressures are better since adjusting BP cuff      Labs   CBC: Recent Labs  Lab 04/18/20 1231 04/19/20 0201 04/19/20 1306  WBC 7.6 8.2  --   NEUTROABS  --  7.5  --   HGB 13.3 13.6 12.6*  HCT 43.5 42.5 37.0*  MCV 110.4* 108.7*  --   PLT 143* 139*  --    Basic Metabolic Panel: Recent Labs  Lab 04/18/20 1231 04/18/20 2334 04/19/20 0201 04/19/20 1001 04/19/20 1306  NA 139 141 141 139 137  K 4.1 4.7 4.9 4.5 4.2  CL 82* 83* 85* 85*  --   CO2 >50* 48* 48* 46*  --   GLUCOSE 157* 102* 109* 139*  --   BUN 14 18 18  25*  --   CREATININE 0.80 0.92 0.85 1.17  --   CALCIUM 10.4* 10.4* 10.5* 10.2  --   MG  --  1.8  --   --   --   PHOS  --  2.8  --   --   --    GFR: Estimated Creatinine Clearance: 61.6 mL/min (by C-G formula based on SCr of 1.17 mg/dL). Recent Labs  Lab 04/18/20 1231 04/19/20 0201  WBC 7.6 8.2   Liver Function Tests: Recent Labs  Lab 04/18/20 1655 04/19/20 1001  AST 27 28  ALT 20 25  ALKPHOS 107 105  BILITOT 0.7 0.6  PROT 6.0* 6.3*  ALBUMIN 3.3* 3.6   No results for input(s): LIPASE, AMYLASE in the last 168 hours. Recent Labs  Lab 04/18/20 1700  AMMONIA 31   ABG    Component Value Date/Time   PHART 7.599 (H) 04/19/2020 1306   PCO2ART 44.9 04/19/2020 1306   PO2ART 174 (H) 04/19/2020 1306   HCO3 44.1 (H) 04/19/2020 1306   TCO2 45 (H) 04/19/2020 1306   O2SAT 100.0 04/19/2020 1306    Coagulation Profile: No results for input(s): INR, PROTIME in the last 168 hours. Cardiac Enzymes: No results for input(s): CKTOTAL, CKMB, CKMBINDEX, TROPONINI in the last 168 hours. HbA1C: Hgb A1c MFr Bld  Date/Time Value Ref Range Status  09/18/2010 01:50 AM (H) <5.7 % Final    5.7 (NOTE)                                                                       According to the ADA Clinical Practice Recommendations for 2011, when HbA1c is used as a screening test:   >=6.5%   Diagnostic of Diabetes Mellitus           (  if abnormal result  is confirmed)  5.7-6.4%   Increased risk of developing Diabetes Mellitus  References:Diagnosis and Classification of Diabetes Mellitus,Diabetes Care,2011,34(Suppl 1):S62-S69 and Standards of Medical Care in         Diabetes - 2011,Diabetes Care,2011,34  (Suppl 1):S11-S61.   CBG: Recent Labs  Lab 04/19/20 0805 04/19/20 1250  GLUCAP 111* 115*    Review of Systems:   All negative; except for those that are bolded, which indicate positives.  Constitutional: weight loss, weight gain, night sweats, fevers, chills, fatigue, weakness.  HEENT: headaches, sore throat, sneezing, nasal congestion, post nasal drip, difficulty swallowing, tooth/dental problems, visual complaints, visual changes, ear aches. Neuro: difficulty with speech, weakness, numbness, ataxia, confusion. CV:  chest pain, orthopnea, PND, swelling in lower extremities, dizziness, palpitations, syncope.  Resp: cough, hemoptysis, dyspnea, wheezing. GI: heartburn, indigestion, abdominal pain, nausea, vomiting, diarrhea, constipation, change in bowel habits, loss of appetite, hematemesis, melena, hematochezia.  GU: dysuria, change in color of urine, urgency or frequency, flank pain, hematuria. MSK: joint pain or swelling, decreased range of motion. Psych: change in mood or affect, depression, anxiety, suicidal ideations, homicidal ideations. Skin: rash, itching, bruising.   Past medical history  He,  has a past medical history of Alcohol abuse (12/27/2010), Allergic rhinitis, cause unspecified (12/27/2010), Anal warts (04/11/2012), CAD, NATIVE VESSEL (10/04/2010), COPD (chronic obstructive pulmonary disease) (HCC), History of blood transfusion (03/2012), HYPERLIPIDEMIA-MIXED (10/04/2010),  HYPERTENSION, BENIGN (10/04/2010), Myocardial infarction (HCC) (09/16/2010), Pneumonia, and TOBACCO ABUSE (10/04/2010).   Surgical History    Past Surgical History:  Procedure Laterality Date  . CARDIAC CATHETERIZATION  ~ 2012  . CORONARY ANGIOPLASTY WITH STENT PLACEMENT  09/17/2010; 09/28/2014   "1; 2"  . FINGER SURGERY Left    "almost cut off" tip of 2nd digit  . LEFT HEART CATHETERIZATION WITH CORONARY ANGIOGRAM N/A 09/28/2014   Procedure: LEFT HEART CATHETERIZATION WITH CORONARY ANGIOGRAM;  Surgeon: Kathleene Hazel, MD;  Location: [facility] CATH LAB;  Service: Cardiovascular;  Laterality: N/A;  . NASAL ENDOSCOPY WITH EPISTAXIS CONTROL Bilateral 03/2012  . VASECTOMY       Social History   reports that he has been smoking cigarettes. He has a 100.00 pack-year smoking history. He has never used smokeless tobacco. He reports current alcohol use of about 144.0 standard drinks of alcohol per week. He reports that he does not use drugs.   Family history   His family history includes Brain cancer in his mother; Heart disease in his father.   Allergies Allergies  Allergen Reactions  . Penicillins Other (See Comments)    Passes out     Home meds  Prior to Admission medications   Medication Sig Start Date End Date Taking? Authorizing Provider  albuterol (PROAIR HFA) 108 (90 Base) MCG/ACT inhaler 2 puffs every 4 hours as needed only  if your can't catch your breath 03/09/20  Yes Nyoka Cowden, MD  aspirin EC 81 MG tablet Take 81 mg by mouth daily.   Yes [provider]  atorvastatin (LIPITOR) 40 MG tablet Take 1 tablet (40 mg total) by mouth daily. 04/15/19 04/18/20 Yes Kathleene Hazel, MD  Budeson-Glycopyrrol-Formoterol (BREZTRI AEROSPHERE) 160-9-4.8 MCG/ACT AERO Inhale 2 puffs into the lungs in the morning and at bedtime. 02/22/20  Yes Nyoka Cowden, MD  furosemide (LASIX) 40 MG tablet Take 1 tablet (40 mg total) by mouth 2 (two) times daily. Patient taking differently: Take  40 mg by mouth daily.  11/24/19  Yes Kathleene Hazel, MD  Multiple Vitamin (MULTIVITAMIN  WITH MINERALS) TABS tablet Take 1 tablet by mouth daily.   Yes [provider]  nitroGLYCERIN (NITROSTAT) 0.4 MG SL tablet Place 1 tablet (0.4 mg total) under the tongue every 5 (five) minutes as needed for chest pain. 11/24/19  Yes Kathleene Hazel, MD    Thurmon Fair, MD PGY2 Internal Medicine [phone]

## 2020-04-19 NOTE — Procedures (Signed)
Intubation Procedure Note John [name] [id number] 09-Apr-1956  Procedure: Intubation Indications: Respiratory insufficiency  Procedure Details Consent: Risks of procedure as well as the alternatives and risks of each were explained to the (patient/caregiver).  Consent for procedure obtained. Time Out: Verified patient identification, verified procedure, site/side was marked, verified correct patient position, special equipment/implants available, medications/allergies/relevent history reviewed, required imaging and test results available.  Performed  3   Evaluation Hemodynamic Status: BP stable throughout; O2 sats: stable throughout Patient's Current Condition: stable Complications: No apparent complications Patient did tolerate procedure well. Chest X-ray ordered to verify placement.  CXR: tube position acceptable.   [name] 04/19/2020

## 2020-04-19 NOTE — Progress Notes (Signed)
RT NOTE: Pt transported from 2C 14 to 48M 13 on BIPAP without any complications.

## 2020-04-19 NOTE — Progress Notes (Signed)
Initial Nutrition Assessment  DOCUMENTATION CODES:   Severe malnutrition in context of chronic illness  INTERVENTION:   Once OG tube repositioned and placement confirmed, initiate tube feeds: - Vital AF 1.2 @ 60 ml/hr (1440 ml/day)  Tube feeding regimen provides 1728 kcal, 108 grams of protein, and 1168 ml of H2O.   - Continue MVI with minerals daily  NUTRITION DIAGNOSIS:   Severe Malnutrition related to chronic illness (COPD) as evidenced by severe muscle depletion, severe fat depletion.  GOAL:   Provide needs based on ASPEN/SCCM guidelines  MONITOR:   Vent status, Labs, Weight trends, TF tolerance  REASON FOR ASSESSMENT:   Malnutrition Screening Tool    ASSESSMENT:   64 year old male who presented to the ED on 7/27 with SOB and palpitations. PMH of COPD, EtOH abuse, tobacco abuse, CAD s/p stent x 2, HTN, HLD. Pt admitted with acute COPD exacerbation.   7/28 - pt became obtunded, intubated and transferred to ICU  Spoke with pt's son and daughter at bedside. Majority of history obtained from pt's daughter. She reports that pt has had a decreased appetite and decreased PO intake for the last 2 months. She reports that pt will feel hungry or that pt's girlfriend will cook something but that he will only take a few bites.  Pt's daughter is unsure of pt's UBW but does endorse weight loss. She reports he weighed 142 lbs at a pulmonary visit about a month ago.  EDW: 62.7 kg  Reviewed weight history in chart. Pt with a weight loss of 5.6 kg from 11/23/19 to 04/18/20. This is an 8.2% weight loss in just under 5 months which is not quite significant for timeframe.  Patient is currently intubated on ventilator support MV: 12.8 L/min Temp (24hrs), Avg:97.8 F (36.6 C), Min:96.9 F (36.1 C), Max:98.1 F (36.7 C) BP (cuff): 147/82 MAP (cuff): 101  Drips: Precedex: 18.8 ml/hr LR: 75 ml/hr Levophed  Medications reviewed and include: colace, folic acid, solu-medrol, MVI with  minerals, vitamin B-12, protonix, miralax, IV abx  Labs reviewed: vitamin D 22.08, ionized calcium 1.14 CBG's: 111-115 x 24 hours  NUTRITION - FOCUSED PHYSICAL EXAM:    Most Recent Value  Orbital Region Severe depletion  Upper Arm Region Mild depletion  Thoracic and Lumbar Region Severe depletion  Buccal Region Unable to assess  Temple Region Moderate depletion  Clavicle Bone Region Severe depletion  Clavicle and Acromion Bone Region Severe depletion  Scapular Bone Region Unable to assess  Dorsal Hand Moderate depletion  Patellar Region Moderate depletion  Anterior Thigh Region Moderate depletion  Posterior Calf Region Moderate depletion  Edema (RD Assessment) None  Hair Reviewed  Eyes Unable to assess  Mouth Unable to assess  Skin Reviewed  Nails Reviewed       Diet Order:   Diet Order            Diet NPO time specified  Diet effective now                 EDUCATION NEEDS:   Not appropriate for education at this time  Skin:  Skin Assessment: Reviewed RN Assessment  Last BM:  04/16/20  Height:   Ht Readings from Last 1 Encounters:  04/19/20 5\' 11"  (1.803 m)    Weight:   Wt Readings from Last 1 Encounters:  04/19/20 66.5 kg    Ideal Body Weight:  78.2 kg  BMI:  Body mass index is 20.45 kg/m.  Estimated Nutritional Needs:   Kcal:  1759  Protein:  90-110 grams  Fluid:  >/= 1.8 L    Earma Reading, MS, RD, LDN Inpatient Clinical Dietitian Please see AMiON for contact information.

## 2020-04-20 ENCOUNTER — Inpatient Hospital Stay (HOSPITAL_COMMUNITY): Payer: Self-pay

## 2020-04-20 DIAGNOSIS — J9602 Acute respiratory failure with hypercapnia: Secondary | ICD-10-CM

## 2020-04-20 LAB — GLUCOSE, CAPILLARY
Glucose-Capillary: 123 mg/dL — ABNORMAL HIGH (ref 70–99)
Glucose-Capillary: 140 mg/dL — ABNORMAL HIGH (ref 70–99)
Glucose-Capillary: 141 mg/dL — ABNORMAL HIGH (ref 70–99)
Glucose-Capillary: 151 mg/dL — ABNORMAL HIGH (ref 70–99)
Glucose-Capillary: 184 mg/dL — ABNORMAL HIGH (ref 70–99)
Glucose-Capillary: 204 mg/dL — ABNORMAL HIGH (ref 70–99)
Glucose-Capillary: 221 mg/dL — ABNORMAL HIGH (ref 70–99)
Glucose-Capillary: 83 mg/dL (ref 70–99)

## 2020-04-20 LAB — TROPONIN I (HIGH SENSITIVITY): Troponin I (High Sensitivity): 27 ng/L — ABNORMAL HIGH (ref <18)

## 2020-04-20 LAB — CBC WITH DIFFERENTIAL/PLATELET
Abs Immature Granulocytes: 0.04 10*3/uL (ref 0.00–0.07)
Basophils Absolute: 0 10*3/uL (ref 0.0–0.1)
Basophils Relative: 0 %
Eosinophils Absolute: 0 10*3/uL (ref 0.0–0.5)
Eosinophils Relative: 0 %
HCT: 36.4 % — ABNORMAL LOW (ref 39.0–52.0)
Hemoglobin: 12.1 g/dL — ABNORMAL LOW (ref 13.0–17.0)
Immature Granulocytes: 1 %
Lymphocytes Relative: 5 %
Lymphs Abs: 0.3 10*3/uL — ABNORMAL LOW (ref 0.7–4.0)
MCH: 34.7 pg — ABNORMAL HIGH (ref 26.0–34.0)
MCHC: 33.2 g/dL (ref 30.0–36.0)
MCV: 104.3 fL — ABNORMAL HIGH (ref 80.0–100.0)
Monocytes Absolute: 0.3 10*3/uL (ref 0.1–1.0)
Monocytes Relative: 5 %
Neutro Abs: 5.9 10*3/uL (ref 1.7–7.7)
Neutrophils Relative %: 89 %
Platelets: 117 10*3/uL — ABNORMAL LOW (ref 150–400)
RBC: 3.49 MIL/uL — ABNORMAL LOW (ref 4.22–5.81)
RDW: 12.6 % (ref 11.5–15.5)
WBC: 6.6 10*3/uL (ref 4.0–10.5)
nRBC: 0 % (ref 0.0–0.2)

## 2020-04-20 LAB — COMPREHENSIVE METABOLIC PANEL
ALT: 22 U/L (ref 0–44)
AST: 24 U/L (ref 15–41)
Albumin: 3.1 g/dL — ABNORMAL LOW (ref 3.5–5.0)
Alkaline Phosphatase: 92 U/L (ref 38–126)
Anion gap: 10 (ref 5–15)
BUN: 39 mg/dL — ABNORMAL HIGH (ref 8–23)
CO2: 35 mmol/L — ABNORMAL HIGH (ref 22–32)
Calcium: 9.1 mg/dL (ref 8.9–10.3)
Chloride: 92 mmol/L — ABNORMAL LOW (ref 98–111)
Creatinine, Ser: 1.03 mg/dL (ref 0.61–1.24)
GFR calc Af Amer: >60 mL/min (ref >60)
GFR calc non Af Amer: >60 mL/min (ref >60)
Glucose, Bld: 225 mg/dL — ABNORMAL HIGH (ref 70–99)
Potassium: 3.6 mmol/L (ref 3.5–5.1)
Sodium: 137 mmol/L (ref 135–145)
Total Bilirubin: 1 mg/dL (ref 0.3–1.2)
Total Protein: 5.2 g/dL — ABNORMAL LOW (ref 6.5–8.1)

## 2020-04-20 LAB — MRSA PCR SCREENING: MRSA by PCR: NEGATIVE

## 2020-04-20 LAB — PHOSPHORUS
Phosphorus: 2.5 mg/dL (ref 2.5–4.6)
Phosphorus: 2.3 mg/dL — ABNORMAL LOW (ref 2.5–4.6)

## 2020-04-20 LAB — MAGNESIUM
Magnesium: 2 mg/dL (ref 1.7–2.4)
Magnesium: 1.8 mg/dL (ref 1.7–2.4)

## 2020-04-20 LAB — CALCIUM, IONIZED: Calcium, Ionized, Serum: 5.3 mg/dL (ref 4.5–5.6)

## 2020-04-20 LAB — PARATHYROID HORMONE, INTACT (NO CA): PTH: 18 pg/mL (ref 15–65)

## 2020-04-20 MED ORDER — LEVETIRACETAM IN NACL 500 MG/100ML IV SOLN
500.0000 mg | Freq: Two times a day (BID) | INTRAVENOUS | Status: DC
  Administered 2020-04-21 – 2020-04-23 (×5): 500 mg via INTRAVENOUS
  Filled 2020-04-20 (×5): qty 100

## 2020-04-20 MED ORDER — LEVETIRACETAM IN NACL 1000 MG/100ML IV SOLN
1000.0000 mg | Freq: Once | INTRAVENOUS | Status: AC
  Administered 2020-04-20: 1000 mg via INTRAVENOUS
  Filled 2020-04-20: qty 100

## 2020-04-20 MED ORDER — METHYLPREDNISOLONE SODIUM SUCC 125 MG IJ SOLR
40.0000 mg | Freq: Two times a day (BID) | INTRAMUSCULAR | Status: DC
  Administered 2020-04-20 – 2020-04-21 (×2): 40 mg via INTRAVENOUS
  Filled 2020-04-20 (×2): qty 2

## 2020-04-20 MED ORDER — LEVETIRACETAM IN NACL 1000 MG/100ML IV SOLN
1000.0000 mg | Freq: Once | INTRAVENOUS | Status: AC
  Administered 2020-04-21: 1000 mg via INTRAVENOUS
  Filled 2020-04-20: qty 100

## 2020-04-20 MED ORDER — LORAZEPAM 2 MG/ML IJ SOLN
2.0000 mg | Freq: Four times a day (QID) | INTRAMUSCULAR | Status: DC
  Administered 2020-04-20 – 2020-04-21 (×5): 2 mg via INTRAVENOUS
  Filled 2020-04-20 (×5): qty 1

## 2020-04-20 MED ORDER — INSULIN ASPART 100 UNIT/ML ~~LOC~~ SOLN
0.0000 [IU] | Freq: Three times a day (TID) | SUBCUTANEOUS | Status: DC
  Administered 2020-04-20: 3 [IU] via SUBCUTANEOUS

## 2020-04-20 MED ORDER — INSULIN ASPART 100 UNIT/ML ~~LOC~~ SOLN
0.0000 [IU] | SUBCUTANEOUS | Status: DC
  Administered 2020-04-20 (×2): 1 [IU] via SUBCUTANEOUS
  Administered 2020-04-20: 2 [IU] via SUBCUTANEOUS
  Administered 2020-04-21 (×2): 1 [IU] via SUBCUTANEOUS
  Administered 2020-04-21 (×2): 2 [IU] via SUBCUTANEOUS
  Administered 2020-04-21 – 2020-04-23 (×3): 1 [IU] via SUBCUTANEOUS

## 2020-04-20 NOTE — Progress Notes (Signed)
NAME:  John [name], MRN:  [id number], DOB:  1956-07-23, LOS: 2 ADMISSION DATE:  04/18/2020, CONSULTATION DATE:  04/19/2020  REFERRING MD:  Dr. Criselda Peaches, IMTS, CHIEF COMPLAINT:  Short of breath   Brief History   64 yo male smoker MZ A1AT heterozygote with severe COPD/emphysema presented with worsening dyspnea and acute on chronic hypoxic/hypercapnic respiratory failure associated with altered mental status.  Failed Bipap and transferred to ICU 7/28 for intubation.  Past Medical History  PNA, HTN, HLD, CAD  Significant Hospital Events   7/27 Admit 7/28 Transfer to ICU, VDRF 7/29 Seizure x two  Consults:  Neurology 7/29  Procedures:  ETT 7/28 >>   Significant Diagnostic Tests:   CT head 7/27 >> no acute findings  EEG 7/28 >> intermittent slowing  Echo 7/28 >> EF 65 to 70%  CT head 7/29 >> no acute findings  Micro Data:  COVID 7/27 >> negative MRSA PCR 7/28 >> negative  Antimicrobials:  Doxycycline 7/27 >>   Interim history/subjective:  Had seizure x two overnight.  Followed commands in between.  Received versed this AM due to agitation causing hypoxia.  Objective   Blood pressure (!) 98/64, pulse 70, temperature 98.1 F (36.7 C), temperature source Oral, resp. rate (!) 24, height 5\' 11"  (1.803 m), weight 67.5 kg, SpO2 100 %.    Vent Mode: PRVC FiO2 (%):  [30 %-40 %] 30 % Set Rate:  [12 bmp-16 bmp] 12 bmp Vt Set:  [600 mL] 600 mL PEEP:  [5 cmH20] 5 cmH20 Pressure Support:  [10 cmH20] 10 cmH20 Plateau Pressure:  [13 cmH20-16 cmH20] 16 cmH20   Intake/Output Summary (Last 24 hours) at 04/20/2020 0827 Last data filed at 04/20/2020 0600 Gross per 24 hour  Intake 3286.69 ml  Output 1195 ml  Net 2091.69 ml   Filed Weights   04/18/20 2259 04/19/20 1200 04/20/20 0200  Weight: 62.7 kg 66.5 kg 67.5 kg    Examination:  General - sedated Eyes - pupils pinpoint and reactive ENT - ETT in place Cardiac - regular rate/rhythm, no murmur Chest - decreased BS, no  wheeze Abdomen - soft, mild distention, non tender, + bowel sounds Extremities - no cyanosis, clubbing, or edema Skin - no rashes Neuro - RASS -2  Resolved Hospital Problem list     Assessment & Plan:   Acute on chronic hypoxic/hypercapnic respiratory failure from COPD exacerbation. Tobacco abuse. Hx of severe COPD/emphysema with MZ A1AT heterozygote. - pressure support as able - not ready for extubation trial - continue pulmicort, duoneb - change solumedrol to 40 mg q12h - f/u CXR - nicotine patch  Acute metabolic encephalopathy. Witnessed seizure x 2 on 04/20/20. Hx of ETOH >> uncertain if he has still been drinking. - RASS goal 0 to -1 - consult neurology - prn versed - thiamine, folic acid, MVI  Steroid induced hyperglycemia. - SSI  Hx of CAD, HLD. - continue ASA, lipitor  Sedation induced hypotension. - continue IV fluids - goal SBP > 90, MAP > 65  Best practice:  Diet: tube feeds DVT prophylaxis: lovenox GI prophylaxis: protonix Mobility: bed rest Code Status: full code Disposition: ICU  Labs    CMP Latest Ref Rng & Units 04/20/2020 04/19/2020 04/19/2020  Glucose 70 - 99 mg/dL 04/21/2020) - 115(B)  BUN 8 - 23 mg/dL 262(M) - 35(D)  Creatinine 0.61 - 1.24 mg/dL 97(C - 1.63  Sodium 8.45 - 145 mmol/L 137 137 139  Potassium 3.5 - 5.1 mmol/L 3.6 4.2 4.5  Chloride 98 -  111 mmol/L 92(L) - 85(L)  CO2 22 - 32 mmol/L 35(H) - 46(H)  Calcium 8.9 - 10.3 mg/dL 9.1 - 31.4  Total Protein 6.5 - 8.1 g/dL 5.2(L) - 6.3(L)  Total Bilirubin 0.3 - 1.2 mg/dL 1.0 - 0.6  Alkaline Phos 38 - 126 U/L 92 - 105  AST 15 - 41 U/L 24 - 28  ALT 0 - 44 U/L 22 - 25    CBC Latest Ref Rng & Units 04/20/2020 04/19/2020 04/19/2020  WBC 4.0 - 10.5 K/uL 6.6 - 8.2  Hemoglobin 13.0 - 17.0 g/dL 12.1(L) 12.6(L) 13.6  Hematocrit 39 - 52 % 36.4(L) 37.0(L) 42.5  Platelets 150 - 400 K/uL 117(L) - 139(L)    ABG    Component Value Date/Time   PHART 7.599 (H) 04/19/2020 1306   PCO2ART 44.9 04/19/2020  1306   PO2ART 174 (H) 04/19/2020 1306   HCO3 44.1 (H) 04/19/2020 1306   TCO2 45 (H) 04/19/2020 1306   O2SAT 100.0 04/19/2020 1306   CBG (last 3)  Recent Labs    04/20/20 0342 04/20/20 0456 04/20/20 0832  GLUCAP 221* 204* 140*    Critical care time: 39 minutes  Coralyn Helling, MD [facility] Pager - [phone] 04/20/2020, 8:43 AM

## 2020-04-20 NOTE — Progress Notes (Signed)
Pt noted to have ST elevation. 12 lead EKG obtained. Elink made aware. Will continue to monitor.

## 2020-04-20 NOTE — Progress Notes (Signed)
Pt was weaning when he began to bite down on the ETT.Pt was flipped back to full support and put on 100%, pt saturations dropped down to 20%. Both RTs present on the unit were in the room attempting to get pts mouth open. RN made aware and RTs were able to get a bite block on pt's tube. Pt was bagged back up to 100% and left on full support. MD made aware of event. RT will continue to monitor.

## 2020-04-20 NOTE — Progress Notes (Addendum)
eLink Physician-Brief Progress Note Patient Name: John [name] DOB: 09/19/1956 MRN: [id number]   Date of Service  04/20/2020  HPI/Events of Note  CTH neg.  EKG: noted some ST elevation on the inferior leads. 2 EKG images reviewed.  Appear Inferior leads repolarization changes with cocave up, not convex up ( seen in STEMI).    eICU Interventions  - will get 3 rd troponin. - will discuss with bed side ICU team.      Intervention Category Intermediate Interventions: Diagnostic test evaluation  [name] 04/20/2020, 1:58 AM   2:10 Discussed with Dr Denese Killings. Looks like troponin leak and from Cr 1.7 Will follow troponin 3 rd. For now continue care.

## 2020-04-20 NOTE — Progress Notes (Signed)
Patient transported to CT and back without any complications.  

## 2020-04-20 NOTE — Consult Note (Addendum)
NEURO HOSPITALIST CONSULT NOTE   Requesting physician: Dr. Halford Chessman  Reason for Consult: Seizures in the context of probable EtOH withdrawal  History obtained from:   Nurse/family  HPI:                                                                                                                                          John [name] is a 64 y.o. male with a PMHx significant for tobacco abuse, alcohol abuse, COPD on 3L [place] 02 @ baseline, CAD, HTN and HLD who presented on 7/27 for worsening confusion. The patient had seizure activity overnight 7/28-29. He has no prior history of seizures. Neurology was consulted to further evaluate.   Per daughter and son at bedside. Patient does not have a seizure history, and has not been drinking or sneaking ETOH according to his girlfriend. Overnight, his nurse noted that the patient was responsive to pain on initial assessment. Later, he was noted to be unresponsive to pain, eyes were fixed and upward gazing. He later became responsive to pain again. Increased tone was not noted.  Today on RN assessment the patient was again unresponsive to pain, and he had tensed up and was clamping down on ETT. Versed was given so that a bite block could be placed. Repeat ABG has not yet been obtained today.   Hospital course: Patient found to be hypercarbic and bipap placed. 7/28/00827 ABG: pco2: 71.4 1000 mg keppra given 7/29 0058 \7/29 CMP: cl: 91, co2: 35, BG: 225 CTH: no abnormalities Repeat CTH 7/29: no abnormalities.   Past Medical History:  Diagnosis Date  . Alcohol abuse 12/27/2010  . Allergic rhinitis, cause unspecified 12/27/2010  . Anal warts 04/11/2012  . CAD, NATIVE VESSEL 10/04/2010  . COPD (chronic obstructive pulmonary disease) ([place])    "a touch" (09/28/2013)  . History of blood transfusion 03/2012   related to nose bleed  . HYPERLIPIDEMIA-MIXED 10/04/2010  . HYPERTENSION, BENIGN 10/04/2010  . Myocardial infarction ([place]) 09/16/2010   . Pneumonia    "when I was a kid"  . TOBACCO ABUSE 10/04/2010    Past Surgical History:  Procedure Laterality Date  . CARDIAC CATHETERIZATION  ~ 2012  . CORONARY ANGIOPLASTY WITH STENT PLACEMENT  09/17/2010; 09/28/2014   "1; 2"  . FINGER SURGERY Left    "almost cut off" tip of 2nd digit  . LEFT HEART CATHETERIZATION WITH CORONARY ANGIOGRAM N/A 09/28/2014   Procedure: LEFT HEART CATHETERIZATION WITH CORONARY ANGIOGRAM;  Surgeon: Burnell Blanks, MD;  Location: [facility] CATH LAB;  Service: Cardiovascular;  Laterality: N/A;  . NASAL ENDOSCOPY WITH EPISTAXIS CONTROL Bilateral 03/2012  . VASECTOMY      Family History  Problem Relation Age of Onset  . Brain cancer Mother   . Heart disease Father  Social History:  reports that he has been smoking cigarettes. He has a 100.00 pack-year smoking history. He has never used smokeless tobacco. He reports current alcohol use of about 144.0 standard drinks of alcohol per week. He reports that he does not use drugs.  Allergies  Allergen Reactions  . Penicillins Other (See Comments)    Passes out    MEDICATIONS:                                                                                                                     Scheduled: . aspirin EC  81 mg Oral Daily  . atorvastatin  40 mg Per Tube Daily  . budesonide (PULMICORT) nebulizer solution  0.5 mg Nebulization BID  . chlorhexidine gluconate (MEDLINE KIT)  15 mL Mouth Rinse BID  . Chlorhexidine Gluconate Cloth  6 each Topical Daily  . docusate  100 mg Per Tube BID  . enoxaparin (LOVENOX) injection  40 mg Subcutaneous Q24H  . folic acid  1 mg Per Tube Daily  . insulin aspart  0-9 Units Subcutaneous Q4H  . ipratropium-albuterol  3 mL Nebulization Q6H  . LORazepam  2 mg Intravenous Q6H  . mouth rinse  15 mL Mouth Rinse 10 times per day  . methylPREDNISolone (SOLU-MEDROL) injection  40 mg Intravenous Q12H  . multivitamin with minerals  1 tablet Per Tube Daily  . nicotine  7 mg  Transdermal Daily  . pantoprazole sodium  40 mg Per Tube Q24H  . polyethylene glycol  17 g Per Tube Daily  . sodium chloride flush  3 mL Intravenous Q12H  . vitamin B-12  100 mcg Per Tube Daily   Continuous: . sodium chloride 10 mL/hr at 04/20/20 0200  . dexmedetomidine (PRECEDEX) IV infusion 0.9 mcg/kg/hr (04/20/20 0915)  . doxycycline (VIBRAMYCIN) IV 100 mg (04/20/20 0852)  . feeding supplement (VITAL AF 1.2 CAL) 1,000 mL (04/19/20 2045)  . lactated ringers 75 mL/hr at 04/20/20 0600  . norepinephrine (LEVOPHED) Adult infusion Stopped (04/19/20 1746)   LTJ:QZESPQ chloride, acetaminophen **OR** acetaminophen, albuterol, fentaNYL (SUBLIMAZE) injection, midazolam, polyethylene glycol   ROS:                                                                                                                                       Unable d/t AMS   Blood pressure (!) 98/64, pulse 70, temperature 98.2 F (36.8 C), temperature source Axillary, resp.  rate (!) 24, height _0  (1.803 m), weight 67.5 kg, SpO2 100 %.   General Examination:                                                                                                       Physical Exam  HEENT-  Normocephalic, no lesions, without obvious abnormality.  Normal external eye and conjunctiva.   Cardiovascular-, pulses palpable throughout   Lungs-no excessive working breathing.  Saturations within normal limits intubated  Abdomen- All 4 quadrants palpated and nontender Extremities- Warm, dry and intact Musculoskeletal-no joint tenderness, deformity or swelling Skin-warm and dry, no hyperpigmentation, vitiligo, or suspicious lesions  Neurological Examination Mental Status: Not able to follow commands. Does not respond to voice.  Cranial Nerves: PERRL at 2 mm, face appears symmetric in presence of ETT.  Motor/Sensory: Patient tensed up with noxious stimuli applied to  LUE and RLE. No response to LLE and RUE. No muscle atrophy  noted Deep Tendon Reflexes: 2+ and symmetric throughout Plantars: Right: mute   Left: mute Cerebellar: UTA   Lab Results: Basic Metabolic Panel: Recent Labs  Lab 04/18/20 1231 04/18/20 1231 04/18/20 2334 04/18/20 2334 04/19/20 0201 04/19/20 1001 04/19/20 1306 04/19/20 1453 04/19/20 1649 04/20/20 0506  NA 139   < > 141  --  141 139 137  --   --  137  K 4.1   < > 4.7  --  4.9 4.5 4.2  --   --  3.6  CL 82*  --  83*  --  85* 85*  --   --   --  92*  CO2 >50*  --  48*  --  48* 46*  --   --   --  35*  GLUCOSE 157*  --  102*  --  109* 139*  --   --   --  225*  BUN 14  --  18  --  18 25*  --   --   --  39*  CREATININE 0.80  --  0.92  --  0.85 1.17  --   --   --  1.03  CALCIUM 10.4*   < > 10.4*   < > 10.5* 10.2  --   --   --  9.1  MG  --   --  1.8  --   --   --   --  1.9 2.1 2.0  PHOS  --   --  2.8  --   --   --   --  1.7* 2.3* 2.5   < > = values in this interval not displayed.    CBC: Recent Labs  Lab 04/18/20 1231 04/19/20 0201 04/19/20 1306 04/20/20 0506  WBC 7.6 8.2  --  6.6  NEUTROABS  --  7.5  --  5.9  HGB 13.3 13.6 12.6* 12.1*  HCT 43.5 42.5 37.0* 36.4*  MCV 110.4* 108.7*  --  104.3*  PLT 143* 139*  --  117*    Imaging: DG Chest 2 View  Result Date: 04/18/2020 CLINICAL DATA:  Shortness of breath for  2 weeks EXAM: CHEST - 2 VIEW COMPARISON:  11/23/2019, 03/18/2018 FINDINGS: The heart size and mediastinal contours are within normal limits. Aortic atherosclerosis. Coronary artery stents. Hyperexpanded lungs with chronically coarsened interstitial markings. Bibasilar scarring. No new focal airspace consolidation, pleural effusion, or pneumothorax. The visualized skeletal structures are unremarkable. IMPRESSION: COPD with bibasilar scarring. No new focal airspace consolidation. Electronically Signed   By: Davina Poke D.O.   On: 04/18/2020 12:44   DG Abd 1 View  Result Date: 04/19/2020 CLINICAL DATA:  NG tube placement. EXAM: ABDOMEN - 1 VIEW COMPARISON:  Earlier  today. FINDINGS: The enteric tube has been advanced with tip and side-port now below the diaphragm in the stomach. Remainder the exam is limited by penetration. IMPRESSION: Tip and side port of the enteric tube below the diaphragm in the stomach. Electronically Signed   By: Keith Rake M.D.   On: 04/19/2020 20:01   CT HEAD WO CONTRAST  Result Date: 04/20/2020 CLINICAL DATA:  Recent seizure EXAM: CT HEAD WITHOUT CONTRAST TECHNIQUE: Contiguous axial images were obtained from the base of the skull through the vertex without intravenous contrast. COMPARISON:  04/18/2020 FINDINGS: Brain: No evidence of acute infarction, hemorrhage, hydrocephalus, extra-axial collection or mass lesion/mass effect. Vascular: No hyperdense vessel or unexpected calcification. Skull: Normal. Negative for fracture or focal lesion. Sinuses/Orbits: No acute finding. Other: None. IMPRESSION: No acute intracranial abnormality noted. Electronically Signed   By: Inez Catalina M.D.   On: 04/20/2020 00:48   CT Head Wo Contrast  Result Date: 04/18/2020 CLINICAL DATA:  Mental status change EXAM: CT HEAD WITHOUT CONTRAST TECHNIQUE: Contiguous axial images were obtained from the base of the skull through the vertex without intravenous contrast. COMPARISON:  None. FINDINGS: Brain: There is no acute intracranial hemorrhage, mass effect, or edema. Gray-white differentiation is preserved. There is no extra-axial fluid collection. Ventricles and sulci are within normal limits in size and configuration. Vascular: There is mild atherosclerotic calcification at the skull base. Skull: Calvarium is unremarkable. Sinuses/Orbits: No acute finding. Other: None. IMPRESSION: No acute intracranial abnormality. Electronically Signed   By: Macy Mis M.D.   On: 04/18/2020 21:03   Portable Chest x-ray  Addendum Date: 04/19/2020   ADDENDUM REPORT: 04/19/2020 12:37 ADDENDUM: The position of the enteric tube was discussed with Dr. Halford Chessman of critical-care by  telephone at 12:36 p.m on 04/19/2020. Electronically Signed   By: Kellie Simmering DO   On: 04/19/2020 12:37   Result Date: 04/19/2020 CLINICAL DATA:  ET tube placement. EXAM: PORTABLE CHEST 1 VIEW COMPARISON:  Prior chest radiograph 04/18/2020 and earlier FINDINGS: ET tube in satisfactory position, terminating midway between the clavicular heads and carina. An enteric tube is coiled within the distal esophagus. Repositioning is recommended. Heart size within normal limits. Cardiac stents. No appreciable airspace consolidation or pulmonary edema. Hyperexpanded lungs consistent with COPD. No definite pleural effusion. No evidence of pneumothorax. No acute bony abnormality identified. IMPRESSION: The enteric tube is coiled within the distal esophagus. Repositioning is recommended prior to use. ET tube in satisfactory position. Findings consistent with COPD. No appreciable airspace consolidation. Electronically Signed: By: Kellie Simmering DO On: 04/19/2020 12:21   DG Abd Portable 1V  Result Date: 04/19/2020 CLINICAL DATA:  Replaced OG tube. EXAM: PORTABLE ABDOMEN - 1 VIEW COMPARISON:  None. FINDINGS: Tip of the enteric tube is in the distal esophagus, the side port is in the mid lower esophagus. Recommend advancement of greater than 10 cm to place the side-port below the diaphragm. Moderate stool in  the included colon. IMPRESSION: Tip of the enteric tube in the distal esophagus, the side-port in the mid lower esophagus. Recommend advancement of greater than 10 cm to place the side-port below the diaphragm. Electronically Signed   By: Keith Rake M.D.   On: 04/19/2020 17:56   EEG adult  Result Date: 04/19/2020 Lora Havens, MD     04/19/2020 10:26 AM Patient Name: John [name] MRN: [id number] Epilepsy Attending: Lora Havens Referring Physician/Provider: Dr. Edison Simon Date: 04/19/2020 Duration: 22.32 mins Patient history: 64 year old male with altered mental status.  EEG 12 for seizures. Level of  alertness: Awake, drowsy, sleep, comatose, lethargic AEDs during EEG study: None Technical aspects: This EEG study was done with scalp electrodes positioned according to the 10-20 International system of electrode placement. Electrical activity was acquired at a sampling rate of _0  and reviewed with a high frequency filter of _1  and a low frequency filter of _2 . EEG data were recorded continuously and digitally stored. Description: The posterior dominant rhythm consists of 8 Hz activity of moderate voltage (25-35 uV) seen predominantly in posterior head regions, symmetric and reactive to eye opening and eye closing.  EEG showed condyle generalized polymorphic mixed frequencies with predominantly 8 to 9 Hz alpha activity as well as intermittent generalized 3 to 5 Hz theta-delta slowing.  Hyperventilation and photic stimulation were not performed.   ABNORMALITY -Intermittent slow, generalized IMPRESSION: This study is suggestive of mild diffuse encephalopathy, nonspecific etiology.  No seizures or epileptiform discharges were seen throughout the recording. Lora Havens   ECHOCARDIOGRAM COMPLETE  Result Date: 04/19/2020    ECHOCARDIOGRAM REPORT   Patient Name:   DOYE MONTILLA Date of Exam: 04/19/2020 Medical Rec #:  [id number]          Height:       71.0 in Accession #:    [id number]         Weight:       138.2 lb Date of Birth:  08/12/1956         BSA:          1.802 m Patient Age:    89 years           BP:           127/77 mmHg Patient Gender: M                  HR:           74 bpm. Exam Location:  Inpatient Procedure: 2D Echo Indications:    Palpitations R00.2  History:        Patient has prior history of Echocardiogram examinations, most                 recent 01/09/2011. CAD, COPD; Risk Factors:Hypertension,                 Dyslipidemia, Alcohol Abuse and Current Smoker.  Sonographer:    Mikki Santee RDCS (AE) Referring Phys: [id number] EMILY B MULLEN  Sonographer Comments: Echo performed with  patient supine and on artificial respirator. IMPRESSIONS  1. Left ventricular ejection fraction, by estimation, is 65 to 70%. The left ventricle has normal function. The left ventricle has no regional wall motion abnormalities. Left ventricular diastolic parameters were normal.  2. Right ventricular systolic function is normal. The right ventricular size is normal. There is normal pulmonary artery systolic pressure.  3. The mitral valve is normal in structure. No evidence of mitral valve regurgitation. No evidence  of mitral stenosis.  4. The aortic valve is tricuspid. Aortic valve regurgitation is not visualized. No aortic stenosis is present.  5. The inferior vena cava is normal in size with greater than 50% respiratory variability, suggesting right atrial pressure of 3 mmHg. FINDINGS  Left Ventricle: Left ventricular ejection fraction, by estimation, is 65 to 70%. The left ventricle has normal function. The left ventricle has no regional wall motion abnormalities. The left ventricular internal cavity size was normal in size. There is  no left ventricular hypertrophy. Left ventricular diastolic parameters were normal. Right Ventricle: The right ventricular size is normal. No increase in right ventricular wall thickness. Right ventricular systolic function is normal. There is normal pulmonary artery systolic pressure. The tricuspid regurgitant velocity is 1.86 m/s, and  with an assumed right atrial pressure of 15 mmHg, the estimated right ventricular systolic pressure is 96.0 mmHg. Left [facility]: Left atrial size was normal in size. Right [facility]: Right atrial size was normal in size. Pericardium: There is no evidence of pericardial effusion. Mitral Valve: The mitral valve is normal in structure. Normal mobility of the mitral valve leaflets. No evidence of mitral valve regurgitation. No evidence of mitral valve stenosis. Tricuspid Valve: The tricuspid valve is normal in structure. Tricuspid valve regurgitation is  trivial. No evidence of tricuspid stenosis. Aortic Valve: The aortic valve is tricuspid. Aortic valve regurgitation is not visualized. No aortic stenosis is present. Pulmonic Valve: The pulmonic valve was normal in structure. Pulmonic valve regurgitation is not visualized. No evidence of pulmonic stenosis. Aorta: The aortic root is normal in size and structure. Venous: The inferior vena cava is normal in size with greater than 50% respiratory variability, suggesting right atrial pressure of 3 mmHg. IAS/Shunts: No atrial level shunt detected by color flow Doppler.  LEFT VENTRICLE PLAX 2D LVIDd:         3.80 cm  Diastology LVIDs:         2.40 cm  LV e' lateral:   0.11 cm/s LV PW:         1.00 cm  LV E/e' lateral: 5.3 LV IVS:        0.90 cm  LV e' medial:    0.09 cm/s LVOT diam:     2.00 cm  LV E/e' medial:  6.5 LV SV:         72 LV SV Index:   40 LVOT Area:     3.14 cm  RIGHT VENTRICLE RV S prime:     12.40 cm/s TAPSE (M-mode): 2.0 cm LEFT ATRIUM             Index       RIGHT ATRIUM           Index LA diam:        2.40 cm 1.33 cm/m  RA Area:     11.40 cm LA Vol (A2C):   38.0 ml 21.09 ml/m RA Volume:   20.90 ml  11.60 ml/m LA Vol (A4C):   40.9 ml 22.69 ml/m LA Biplane Vol: 42.0 ml 23.30 ml/m  AORTIC VALVE LVOT Vmax:   109.00 cm/s LVOT Vmean:  73.600 cm/s LVOT VTI:    0.230 m  AORTA Ao Root diam: 4.00 cm MITRAL VALVE               TRICUSPID VALVE MV Area (PHT): 3.68 cm    TR Peak grad:   13.8 mmHg MV Decel Time: 206 msec    TR Vmax:  186.00 cm/s MV E velocity: 0.58 cm/s MV A velocity: 53.80 cm/s  SHUNTS MV E/A ratio:  0.01        Systemic VTI:  0.23 m                            Systemic Diam: 2.00 cm Skeet Latch MD Electronically signed by Skeet Latch MD Signature Date/Time: 04/19/2020/6:11:01 PM    Final    Laurey Morale, MSN, NP-C [facility] [phone]  Assessment: 64 year old male who presented with AMS in the setting of acute on chronic hypoxic/hypercapnic respiratory  failure from COPD exacerbation. He was intubated for progressive respiratory distress. Had seizure-like spells overnight while on Precedex for sedation, with further worsening of AMS, possibly secondary to EtOH withdrawal.   1. Examination reveals decreased responsiveness to noxious stimuli, not following commands and not attempting to communicate in the setting of being intubated and on Precedex drip, which may be confounding the localizing value of the exam.  2. CT head: No acute intracranial abnormality.  3. Elevated BUN/Cr, suggestive of volume depletion.  4. Total serum Ca normal at 9.1 with ionized Ca slightly low at 1.14.  5. Ammonia normal at 31. AST and ALT normal.  6. No neck stiffness, white count or fever to suggest a meningitis.   Recommendations: 1. LTM EEG 2. Frequent neuro checks 3. Scheduled Ativan 2 mg IV q6h x 4 doses, then 1 mg IV q6h x 8 doses, then PRN.  4. May need MRI brain if his exam does not improve after Precedex is weaned.   Addendum: -- LTM EEG reviewed at bedside at 9:30 PM: Diffuse slowing with no electrographic seizures seen -- Patient keeps eyes closed for most of the exam but briefly opens with stimulation. The patient squeezed examiner's hand with both of his hands to verbal command, and nodded head when asked if he could answer yes/no questions by nodding/shaking. However, when asked if he had been recently drinking, he kept eyes closed and did not reply.   Addendum: RN called Dr. Rory Percy regarding seizure-like activity of short duration while he was being bathed. DDx includes seizure versus agitation induced by stimulation. Will reload with Keppra 1000 mg IV and start scheduled Keppra dosing at 500 mg IV BID.   40 minutes spent in the neurological evaluation and management of this critically ill patient.   Electronically signed: Dr. Kerney Elbe 04/20/2020, 10:21 AM

## 2020-04-20 NOTE — Progress Notes (Signed)
eLink Physician-Brief Progress Note Patient Name: John [name] DOB: Aug 10, 1956 MRN: [id number]   Date of Service  04/20/2020  HPI/Events of Note  Camera: As per bed side RN. Witnessed seizure, clonic with eye ball rolling up with post ictal. On Precedex gtt. Not getting prn versed. EEG no sz activity and CTH yesterday neg. Neurology on case for encephalopathy.   VS stable now. Was steering look when I saw him with ? nystagmus, pupil sluggish.   eICU Interventions  - will treat him as having active epilepsy. pco2 < 45. Get [facility] again. Keppra 1 gm stat Neurology to follow up in AM.      Intervention Category Major Interventions: Seizures - evaluation and management  [name] 04/20/2020, 12:05 AM

## 2020-04-20 NOTE — Progress Notes (Signed)
Pt had a witnessed seizure. Pt raised arms and eye fixated upwards. Seizure lasted 45 seconds. Elink made aware. Will continue to assess.

## 2020-04-20 NOTE — Progress Notes (Signed)
eLink Physician-Brief Progress Note Patient Name: MYCHAEL SMOCK DOB: 12-24-1955 MRN: [id number]   Date of Service  04/20/2020  HPI/Events of Note  Patient with very brief tonic-clonic jerking with spontaneous resolution, he has known seizures on cEEG with neurology following.  eICU Interventions  No intervention at this time. Continue to monitor patient closely.        Thomasene Lot Kearney Ev[place] 04/20/2020, 11:51 PM

## 2020-04-20 NOTE — Progress Notes (Signed)
eLink Physician-Brief Progress Note Patient Name: John [name] DOB: 1956-01-26 MRN: [id number]   Date of Service  04/20/2020  HPI/Events of Note  pt was started on tube feeding lastnight  blood sugar trending up latest is 221>>pt no sliding scale order for insulin>> no hx of diabetes  eICU Interventions  -ssi ordered.      Intervention Category Intermediate Interventions: Hyperglycemia - evaluation and treatment  [name] 04/20/2020, 4:22 AM

## 2020-04-20 NOTE — Progress Notes (Signed)
LTM EEG hooked up and running - no initial skin breakdown - push button tested - neuro notified. [facility] monitoring.  

## 2020-04-21 ENCOUNTER — Inpatient Hospital Stay (HOSPITAL_COMMUNITY): Payer: Self-pay

## 2020-04-21 ENCOUNTER — Encounter (HOSPITAL_COMMUNITY): Payer: Self-pay | Admitting: Internal Medicine

## 2020-04-21 LAB — COMPREHENSIVE METABOLIC PANEL
ALT: 23 U/L (ref 0–44)
AST: 21 U/L (ref 15–41)
Albumin: 2.8 g/dL — ABNORMAL LOW (ref 3.5–5.0)
Alkaline Phosphatase: 86 U/L (ref 38–126)
Anion gap: 6 (ref 5–15)
BUN: 33 mg/dL — ABNORMAL HIGH (ref 8–23)
CO2: 37 mmol/L — ABNORMAL HIGH (ref 22–32)
Calcium: 9.1 mg/dL (ref 8.9–10.3)
Chloride: 97 mmol/L — ABNORMAL LOW (ref 98–111)
Creatinine, Ser: 0.71 mg/dL (ref 0.61–1.24)
GFR calc Af Amer: >60 mL/min (ref >60)
GFR calc non Af Amer: >60 mL/min (ref >60)
Glucose, Bld: 149 mg/dL — ABNORMAL HIGH (ref 70–99)
Potassium: 3.3 mmol/L — ABNORMAL LOW (ref 3.5–5.1)
Sodium: 140 mmol/L (ref 135–145)
Total Bilirubin: 0.5 mg/dL (ref 0.3–1.2)
Total Protein: 4.9 g/dL — ABNORMAL LOW (ref 6.5–8.1)

## 2020-04-21 LAB — POCT I-STAT 7, (LYTES, BLD GAS, ICA,H+H)
Acid-Base Excess: 14 mmol/L — ABNORMAL HIGH (ref 0.0–2.0)
Acid-Base Excess: 11 mmol/L — ABNORMAL HIGH (ref 0.0–2.0)
Bicarbonate: 39.3 mmol/L — ABNORMAL HIGH (ref 20.0–28.0)
Bicarbonate: 36.5 mmol/L — ABNORMAL HIGH (ref 20.0–28.0)
Calcium, Ion: 1.24 mmol/L (ref 1.15–1.40)
Calcium, Ion: 1.25 mmol/L (ref 1.15–1.40)
HCT: 35 % — ABNORMAL LOW (ref 39.0–52.0)
HCT: 35 % — ABNORMAL LOW (ref 39.0–52.0)
Hemoglobin: 11.9 g/dL — ABNORMAL LOW (ref 13.0–17.0)
Hemoglobin: 11.9 g/dL — ABNORMAL LOW (ref 13.0–17.0)
O2 Saturation: 93 %
O2 Saturation: 96 %
Patient temperature: 98.1
Patient temperature: 97
Potassium: 3.3 mmol/L — ABNORMAL LOW (ref 3.5–5.1)
Potassium: 4.2 mmol/L (ref 3.5–5.1)
Sodium: 140 mmol/L (ref 135–145)
Sodium: 139 mmol/L (ref 135–145)
TCO2: 41 mmol/L — ABNORMAL HIGH (ref 22–32)
TCO2: 38 mmol/L — ABNORMAL HIGH (ref 22–32)
pCO2 arterial: 50.8 mmHg — ABNORMAL HIGH (ref 32.0–48.0)
pCO2 arterial: 48.3 mmHg — ABNORMAL HIGH (ref 32.0–48.0)
pH, Arterial: 7.495 — ABNORMAL HIGH (ref 7.350–7.450)
pH, Arterial: 7.482 — ABNORMAL HIGH (ref 7.350–7.450)
pO2, Arterial: 63 mmHg — ABNORMAL LOW (ref 83.0–108.0)
pO2, Arterial: 75 mmHg — ABNORMAL LOW (ref 83.0–108.0)

## 2020-04-21 LAB — CBC WITH DIFFERENTIAL/PLATELET
Abs Immature Granulocytes: 0.05 10*3/uL (ref 0.00–0.07)
Basophils Absolute: 0 10*3/uL (ref 0.0–0.1)
Basophils Relative: 0 %
Eosinophils Absolute: 0 10*3/uL (ref 0.0–0.5)
Eosinophils Relative: 0 %
HCT: 35.3 % — ABNORMAL LOW (ref 39.0–52.0)
Hemoglobin: 11.7 g/dL — ABNORMAL LOW (ref 13.0–17.0)
Immature Granulocytes: 1 %
Lymphocytes Relative: 3 %
Lymphs Abs: 0.2 10*3/uL — ABNORMAL LOW (ref 0.7–4.0)
MCH: 34.3 pg — ABNORMAL HIGH (ref 26.0–34.0)
MCHC: 33.1 g/dL (ref 30.0–36.0)
MCV: 103.5 fL — ABNORMAL HIGH (ref 80.0–100.0)
Monocytes Absolute: 1 10*3/uL (ref 0.1–1.0)
Monocytes Relative: 11 %
Neutro Abs: 7.5 10*3/uL (ref 1.7–7.7)
Neutrophils Relative %: 85 %
Platelets: 110 10*3/uL — ABNORMAL LOW (ref 150–400)
RBC: 3.41 MIL/uL — ABNORMAL LOW (ref 4.22–5.81)
RDW: 12.9 % (ref 11.5–15.5)
WBC: 8.8 10*3/uL (ref 4.0–10.5)
nRBC: 0 % (ref 0.0–0.2)

## 2020-04-21 LAB — GLUCOSE, CAPILLARY
Glucose-Capillary: 101 mg/dL — ABNORMAL HIGH (ref 70–99)
Glucose-Capillary: 126 mg/dL — ABNORMAL HIGH (ref 70–99)
Glucose-Capillary: 133 mg/dL — ABNORMAL HIGH (ref 70–99)
Glucose-Capillary: 144 mg/dL — ABNORMAL HIGH (ref 70–99)
Glucose-Capillary: 145 mg/dL — ABNORMAL HIGH (ref 70–99)
Glucose-Capillary: 162 mg/dL — ABNORMAL HIGH (ref 70–99)

## 2020-04-21 MED ORDER — PREDNISONE 10 MG PO TABS
20.0000 mg | ORAL_TABLET | Freq: Every day | ORAL | Status: DC
  Administered 2020-04-21 – 2020-04-23 (×3): 20 mg
  Filled 2020-04-21 (×3): qty 2

## 2020-04-21 MED ORDER — ACETYLCYSTEINE 20 % IN SOLN
4.0000 mL | Freq: Four times a day (QID) | RESPIRATORY_TRACT | Status: DC
  Administered 2020-04-22 (×2): 4 mL via RESPIRATORY_TRACT
  Filled 2020-04-21 (×3): qty 4

## 2020-04-21 MED ORDER — POTASSIUM CHLORIDE 10 MEQ/100ML IV SOLN
INTRAVENOUS | Status: AC
  Filled 2020-04-21: qty 100

## 2020-04-21 MED ORDER — ASPIRIN 81 MG PO CHEW
81.0000 mg | CHEWABLE_TABLET | Freq: Every day | ORAL | Status: DC
  Administered 2020-04-21 – 2020-04-23 (×3): 81 mg
  Filled 2020-04-21 (×3): qty 1

## 2020-04-21 MED ORDER — POTASSIUM CHLORIDE 20 MEQ/15ML (10%) PO SOLN
20.0000 meq | ORAL | Status: AC
  Administered 2020-04-21 (×2): 20 meq
  Filled 2020-04-21 (×2): qty 15

## 2020-04-21 MED ORDER — POTASSIUM CHLORIDE 10 MEQ/100ML IV SOLN
10.0000 meq | INTRAVENOUS | Status: AC
  Administered 2020-04-21 (×4): 10 meq via INTRAVENOUS
  Filled 2020-04-21 (×3): qty 100

## 2020-04-21 MED ORDER — LORAZEPAM 2 MG/ML IJ SOLN: 2.0000 mg | Freq: Four times a day (QID) | INTRAMUSCULAR | Status: DC | PRN

## 2020-04-21 NOTE — Progress Notes (Signed)
eLink Physician-Brief Progress Note Patient Name: John [name] DOB: 11-12-1955 MRN: [id number]   Date of Service  04/21/2020  HPI/Events of Note  Agitation - Patient tried to pull out NGT. Nursing request for bilateral soft wrist restraints.   eICU Interventions  Plan: 1. Bilateral soft wrist restraints X 8 hours.      Intervention Category Major Interventions: Delirium, psychosis, severe agitation - evaluation and management  [name] 04/21/2020, 11:47 PM

## 2020-04-21 NOTE — Progress Notes (Signed)
Patient's partner of 20 yrs, Eunice Blase, was authorized to visit in addition to pt's dtr and 2 sons.

## 2020-04-21 NOTE — Progress Notes (Signed)
NAME:  John [name], MRN:  [id number], DOB:  Jan 18, 1956, LOS: 3 ADMISSION DATE:  04/18/2020, CONSULTATION DATE:  04/19/2020  REFERRING MD:  Dr. Criselda Peaches, IMTS, CHIEF COMPLAINT:  Short of breath   Brief History   64 yo male smoker MZ A1AT heterozygote with severe COPD/emphysema presented with worsening dyspnea and acute on chronic hypoxic/hypercapnic respiratory failure associated with altered mental status.  Failed Bipap and transferred to ICU 7/28 for intubation.  Past Medical History  PNA, HTN, HLD, CAD  Significant Hospital Events   7/27 Admit 7/28 Transfer to ICU, VDRF 7/29 Seizure x two, start LTM  Consults:  Neurology 7/29  Procedures:  ETT 7/28 >>   Significant Diagnostic Tests:   CT head 7/27 >> no acute findings  EEG 7/28 >> intermittent slowing  Echo 7/28 >> EF 65 to 70%  CT head 7/29 >> no acute findings  Micro Data:  COVID 7/27 >> negative MRSA PCR 7/28 >> negative  Antimicrobials:  Doxycycline 7/27 >>   Interim history/subjective:  Had episode of eyelid fluttering and blank stare while getting bath overnight.  On LTM.  PEEPi elevated (total PEEP 12, set PEEP 5).  Objective   Blood pressure 109/73, pulse 74, temperature (!) 97 F (36.1 C), temperature source Axillary, resp. rate 16, height 5\' 11"  (1.803 m), weight 70.2 kg, SpO2 100 %.    Vent Mode: PRVC FiO2 (%):  [30 %] 30 % Set Rate:  [12 bmp] 12 bmp Vt Set:  [600 mL] 600 mL PEEP:  [5 cmH20] 5 cmH20 Plateau Pressure:  [14 cmH20-21 cmH20] 18 cmH20   Intake/Output Summary (Last 24 hours) at 04/21/2020 0859 Last data filed at 04/21/2020 04/23/2020 Gross per 24 hour  Intake 5824.19 ml  Output 1285 ml  Net 4539.19 ml   Filed Weights   04/19/20 1200 04/20/20 0200 04/21/20 0413  Weight: 66.5 kg 67.5 kg 70.2 kg    Examination:  General - sedated Eyes - pupils reactive ENT - ETT in place Cardiac - regular rate/rhythm, no murmur Chest - decreased BS, no wheeze Abdomen - soft, non tender, + bowel  sounds Extremities - no cyanosis, clubbing, or edema Skin - no rashes Neuro - RASS -3  Resolved Hospital Problem list     Assessment & Plan:   Acute on chronic hypoxic/hypercapnic respiratory failure from COPD exacerbation. Tobacco abuse. Hx of severe COPD/emphysema with MZ A1AT heterozygote. - adjusted vent settings to avoid PEEPi - continue pulmicort, duoneb - nicotine patch - day 4 of doxycycline - change to prednisone 20 mg daily on 7/30 and wean off as tolerated  Acute metabolic encephalopathy. Witnessed seizure x 2 on 04/20/20. Hx of ETOH >> his girlfriend and children report he stopped drinking in August 2020. - RASS goal 0 to -1 - continue precedex with prn versed, fentanyl - keppra added 7/29 - continue LTM per neurology - thiamine, folic acid, MVI  Steroid induced hyperglycemia. - SSI  Hx of CAD, HLD. - continue ASA, lipitor  Hypotension from sedation and hypovolemia. - continue IV fluids - goal SBP > 90, MAP > 65  Hypokalemia. - f/u BMET  Thrombocytopenia. - mild - f/u CBC  Best practice:  Diet: tube feeds DVT prophylaxis: lovenox GI prophylaxis: protonix Mobility: bed rest Code Status: full code Disposition: ICU  Labs    CMP Latest Ref Rng & Units 04/21/2020 04/21/2020 04/20/2020  Glucose 70 - 99 mg/dL - 04/22/2020) 185(U)  BUN 8 - 23 mg/dL - 314(H) 70(Y)  Creatinine 0.61 - 1.24  mg/dL - 5.63 1.49  Sodium 702 - 145 mmol/L 140 140 137  Potassium 3.5 - 5.1 mmol/L 3.3(L) 3.3(L) 3.6  Chloride 98 - 111 mmol/L - 97(L) 92(L)  CO2 22 - 32 mmol/L - 37(H) 35(H)  Calcium 8.9 - 10.3 mg/dL - 9.1 9.1  Total Protein 6.5 - 8.1 g/dL - 4.9(L) 5.2(L)  Total Bilirubin 0.3 - 1.2 mg/dL - 0.5 1.0  Alkaline Phos 38 - 126 U/L - 86 92  AST 15 - 41 U/L - 21 24  ALT 0 - 44 U/L - 23 22    CBC Latest Ref Rng & Units 04/21/2020 04/21/2020 04/20/2020  WBC 4.0 - 10.5 K/uL - 8.8 6.6  Hemoglobin 13.0 - 17.0 g/dL 11.9(L) 11.7(L) 12.1(L)  Hematocrit 39 - 52 % 35.0(L) 35.3(L)  36.4(L)  Platelets 150 - 400 K/uL - 110(L) 117(L)    ABG    Component Value Date/Time   PHART 7.495 (H) 04/21/2020 0315   PCO2ART 50.8 (H) 04/21/2020 0315   PO2ART 63 (L) 04/21/2020 0315   HCO3 39.3 (H) 04/21/2020 0315   TCO2 41 (H) 04/21/2020 0315   O2SAT 93.0 04/21/2020 0315   CBG (last 3)  Recent Labs    04/20/20 2349 04/21/20 0401 04/21/20 0758  GLUCAP 151* 133* 145*    Critical care time: 33 minutes  Coralyn Helling, MD [facility] Pager - [phone] 04/21/2020, 8:59 AM

## 2020-04-21 NOTE — Progress Notes (Signed)
Subjective: Had seizure like events overnight described as eye fluttering, limb jerking without eeg change. Daughter and son at bedside state he was confused for about 2 weeks prior to admission.   ROS:  Unable to obtain due to poor mental status  Examination  Vital signs in last 24 hours: Temp:  [96.7 F (35.9 C)-99.1 F (37.3 C)] 99.1 F (37.3 C) (07/30 1517) Pulse Rate:  [68-93] 84 (07/30 1517) Resp:  [13-21] 18 (07/30 1517) BP: (90-118)/(57-84) 90/65 (07/30 1517) SpO2:  [88 %-100 %] 100 % (07/30 1517) FiO2 (%):  [30 %] 30 % (07/30 1517) Weight:  [70.2 kg] 70.2 kg (07/30 0413)  General: lying in bed, NAD CVS: pulse-normal rate and rhythm RS: breathing comfortably, intubated Extremities: normal, warm   Neuro: MS: opens eyes to repeated tactile stimuli, follows commands like squeezing hand, wiggling toes CN: pupils equal and reactive,  EOMI, face symmetric, rest difficult to assess due to intubation Motor: antigravity strength in all 4 extremities  Basic Metabolic Panel: Recent Labs  Lab 04/18/20 2334 04/18/20 2334 04/19/20 0201 04/19/20 0201 04/19/20 1001 04/19/20 1001 04/19/20 1306 04/19/20 1453 04/19/20 1649 04/20/20 0506 04/20/20 1606 04/21/20 0259 04/21/20 0315 04/21/20 1141  NA 141   < > 141   < > 139   < > 137  --   --  137  --  140 140 139  K 4.7   < > 4.9   < > 4.5   < > 4.2  --   --  3.6  --  3.3* 3.3* 4.2  CL 83*  --  85*  --  85*  --   --   --   --  92*  --  97*  --   --   CO2 48*  --  48*  --  46*  --   --   --   --  35*  --  37*  --   --   GLUCOSE 102*  --  109*  --  139*  --   --   --   --  225*  --  149*  --   --   BUN 18  --  18  --  25*  --   --   --   --  39*  --  33*  --   --   CREATININE 0.92  --  0.85  --  1.17  --   --   --   --  1.03  --  0.71  --   --   CALCIUM 10.4*   < > 10.5*   < > 10.2  --   --   --   --  9.1  --  9.1  --   --   MG 1.8  --   --   --   --   --   --  1.9 2.1 2.0 1.8  --   --   --   PHOS 2.8  --   --   --   --   --   --   1.7* 2.3* 2.5 2.3*  --   --   --    < > = values in this interval not displayed.    CBC: Recent Labs  Lab 04/18/20 1231 04/18/20 1231 04/19/20 0201 04/19/20 0201 04/19/20 1306 04/20/20 0506 04/21/20 0259 04/21/20 0315 04/21/20 1141  WBC 7.6  --  8.2  --   --  6.6 8.8  --   --  NEUTROABS  --   --  7.5  --   --  5.9 7.5  --   --   HGB 13.3   < > 13.6   < > 12.6* 12.1* 11.7* 11.9* 11.9*  HCT 43.5   < > 42.5   < > 37.0* 36.4* 35.3* 35.0* 35.0*  MCV 110.4*  --  108.7*  --   --  104.3* 103.5*  --   --   PLT 143*  --  139*  --   --  117* 110*  --   --    < > = values in this interval not displayed.     Coagulation Studies: No results for input(s): LABPROT, INR in the last 72 hours.  Imaging CT head wo contrast 04/21/2020: No acute intracranial abnormality noted.  ASSESSMENT AND PLAN: 64 year old male who presented with AMS in the setting of acute on chronic hypoxic/hypercapnic respiratory failure from COPD exacerbation. He was intubated for progressive respiratory distress. Had seizure like episodes while on precedex.   Seizure like event Acute respiratory failure  - Two events recorded on LTM, no eeg change. Both events were when patient was stimulated. Semiology also less likely to be seizure Differentials include myoclonus, tremulousness related to alcohol withdrawal  Recommendations - Continue keppra 500mg  BID, would not increase unless eeg concerning for seizure - Ok to wean off sedation from neurology standpoint - please contact neurology before administering ativan for any further seizure like episodes.  - will continue ltm eeg for another day for characterization of spells. Will dc tomorrow if continues to be normal - seizure precautions - Management of rest of comorbidities per primary team - Discussed plan with daughter, son and nurse in detail   CRITICAL CARE Performed by:  Total critical care time: 35 minutes  Critical care time was  exclusive of separately billable procedures and treating other patients.  Critical care was necessary to treat or prevent imminent or life-threatening deterioration.  Critical care was time spent personally by me on the following activities: development of treatment plan with patient and/or surrogate as well as nursing, discussions with consultants, evaluation of patient's response to treatment, examination of patient, obtaining history from patient or surrogate, ordering and performing treatments and interventions, ordering and review of laboratory studies, ordering and review of radiographic studies, pulse oximetry and re-evaluation of patient's condition.   Charlsie Quest Epilepsy [facility] For questions after 5pm please refer to AMION to reach the Neurologist on call

## 2020-04-21 NOTE — Progress Notes (Addendum)
During pts bath, pt began having seizure like activity that lasted around 1 min, medication given. E-link and neuro on call MD notified. Will continue to monitor.

## 2020-04-21 NOTE — Procedures (Addendum)
Patient Name: John [name]  MRN: [id number]  Epilepsy Attending: Charlsie Quest  Referring Physician/Provider: Dr. Caryl Pina Duration: 04/20/2020 1117 to 04/21/2020 1117  Patient history: 64 year old male with altered mental status.  EEG to assess for seizures.  Level of alertness: lethargic  AEDs during EEG study: None  Technical aspects: This EEG study was done with scalp electrodes positioned according to the 10-20 International system of electrode placement. Electrical activity was acquired at a sampling rate of 500Hz  and reviewed with a high frequency filter of 70Hz  and a low frequency filter of 1Hz . EEG data were recorded continuously and digitally stored.   Description: No posterior dominant rhythm was seen. Sleep was characterized by vertex waves, sleep spindles (12-14hz ), maximal frontocentral region. EEG showed continuous generalized 3 to 5 Hz theta-delta slowing with overriding 15-18Hz  generalized beta activity.  Hyperventilation and photic stimulation were not performed.     Event button was pressed on 04/20/2020 at around 2100 for seizure like activity described as eye fluttering and staring. Concomitant eeg before, during and after the event didn't show any eeg event to show seizure.   Another event was captured around 1015 on 04/21/2020 during which patient was noted to have eye fluttering and right arm tremulousness after waking him up. Concomitant eeg before, during and after the event didn't show any eeg event to show seizure.   ABNORMALITY - Continuous slow, generalized - Excessive beta, generalized  IMPRESSION: This study is suggestive of severe diffuse encephalopathy, nonspecific etiology but could be secondary to sedation. Two events were recorded as described above without any eeg change and were most likely not epileptic. No seizures or epileptiform discharges were seen throughout the recording.  [name] 04/22/2020

## 2020-04-21 NOTE — Progress Notes (Signed)
LTM maint complete - no skin breakdown under:  fp1, f7, m1

## 2020-04-22 ENCOUNTER — Inpatient Hospital Stay (HOSPITAL_COMMUNITY): Payer: Self-pay

## 2020-04-22 DIAGNOSIS — J449 Chronic obstructive pulmonary disease, unspecified: Secondary | ICD-10-CM

## 2020-04-22 LAB — CBC
HCT: 36.6 % — ABNORMAL LOW (ref 39.0–52.0)
Hemoglobin: 11.8 g/dL — ABNORMAL LOW (ref 13.0–17.0)
MCH: 34.5 pg — ABNORMAL HIGH (ref 26.0–34.0)
MCHC: 32.2 g/dL (ref 30.0–36.0)
MCV: 107 fL — ABNORMAL HIGH (ref 80.0–100.0)
Platelets: 87 10*3/uL — ABNORMAL LOW (ref 150–400)
RBC: 3.42 MIL/uL — ABNORMAL LOW (ref 4.22–5.81)
RDW: 13.3 % (ref 11.5–15.5)
WBC: 6.8 10*3/uL (ref 4.0–10.5)
nRBC: 0 % (ref 0.0–0.2)

## 2020-04-22 LAB — MAGNESIUM: Magnesium: 1.5 mg/dL — ABNORMAL LOW (ref 1.7–2.4)

## 2020-04-22 LAB — BASIC METABOLIC PANEL
Anion gap: 5 (ref 5–15)
BUN: 34 mg/dL — ABNORMAL HIGH (ref 8–23)
CO2: 32 mmol/L (ref 22–32)
Calcium: 9 mg/dL (ref 8.9–10.3)
Chloride: 102 mmol/L (ref 98–111)
Creatinine, Ser: 0.73 mg/dL (ref 0.61–1.24)
GFR calc Af Amer: >60 mL/min (ref >60)
GFR calc non Af Amer: >60 mL/min (ref >60)
Glucose, Bld: 98 mg/dL (ref 70–99)
Potassium: 4.4 mmol/L (ref 3.5–5.1)
Sodium: 139 mmol/L (ref 135–145)

## 2020-04-22 LAB — GLUCOSE, CAPILLARY
Glucose-Capillary: 99 mg/dL (ref 70–99)
Glucose-Capillary: 102 mg/dL — ABNORMAL HIGH (ref 70–99)
Glucose-Capillary: 103 mg/dL — ABNORMAL HIGH (ref 70–99)
Glucose-Capillary: 87 mg/dL (ref 70–99)

## 2020-04-22 MED ORDER — LIP MEDEX EX OINT
TOPICAL_OINTMENT | CUTANEOUS | Status: DC | PRN
  Filled 2020-04-22: qty 7

## 2020-04-22 MED ORDER — RESOURCE THICKENUP CLEAR PO POWD
ORAL | Status: DC | PRN
  Filled 2020-04-22: qty 125

## 2020-04-22 MED ORDER — BISACODYL 10 MG RE SUPP
10.0000 mg | Freq: Every day | RECTAL | Status: DC | PRN
  Administered 2020-04-22: 10 mg via RECTAL
  Filled 2020-04-22: qty 1

## 2020-04-22 MED ORDER — PANTOPRAZOLE SODIUM 40 MG PO TBEC
40.0000 mg | DELAYED_RELEASE_TABLET | Freq: Every day | ORAL | Status: DC
  Administered 2020-04-22 – 2020-04-24 (×3): 40 mg via ORAL
  Filled 2020-04-22 (×3): qty 1

## 2020-04-22 NOTE — Progress Notes (Signed)
Modified Barium Swallow Progress Note  Patient Details  Name: John [name] MRN: [id number] Date of Birth: December 03, 1955  Today's Date: 04/22/2020  Modified Barium Swallow completed.  Full report located under Chart Review in the Imaging Section.  Brief recommendations include the following:  Clinical Impression  Pt presents with a moderate oropharyngeal dysphagia c/b decreased base of tongue retraction, reduced laryngeal elevationand excursion, incomplete laryngeal closure, and diminished sensation.  These deficits resulted in silent aspiration of thin liquid and nectar thick liquid by straw. Cued cough was not effective to clear penetration/aspiration. There was no aspiration of cup sips of nectar thick liquid, including when used as liquid was for pill simulation.  There was moderate vallecual residue which increased with viscosity of bolus.  Pt was able to reduce residue with subsequent swallows.  Recommend regular texture diet with nectar thick liquid.   Swallow Evaluation Recommendations       SLP Diet Recommendations: Regular solids;Nectar thick liquid       Medication Administration: Whole meds with liquid   Supervision: Patient able to self feed   Compensations: Slow rate;Small sips/bites   Postural Changes: Seated upright at 90 degrees   Oral Care Recommendations: Oral care BID        Kerrie Pleasure, MA, CCC-SLP Acute Rehabilitation Services Office: [phone] 04/22/2020,5:40 PM

## 2020-04-22 NOTE — Progress Notes (Signed)
LTM discontinued; no skin breakdown was seen. 

## 2020-04-22 NOTE — Procedures (Addendum)
Patient Name:John [name] BMW:[id number] Epilepsy Attending:Eryc Bodey Annabelle Harman Referring Physician/Provider:Dr. Caryl Pina Duration:04/21/2020 2725 to 04/22/2020 1334  Patient history:64 year old male with altered mental status. EEG to assess for seizures.  Level of alertness:lethargic  AEDs during EEG study:None  Technical aspects: This EEG study was done with scalp electrodes positioned according to the 10-20 International system of electrode placement. Electrical activity was acquired at a sampling rate of 500Hz  and reviewed with a high frequency filter of 70Hz  and a low frequency filter of 1Hz . EEG data were recorded continuously and digitally stored.   Description: No posterior dominant rhythm was seen. Sleep was characterized by vertex waves, sleep spindles (12-14hz ), maximal frontocentral region.EEG showed continuous generalized 3 to 5 Hz theta-delta slowing with overriding 15-18Hz  generalized beta activity. Hyperventilation and photic stimulation were not performed.   ABNORMALITY - Continuous slow, generalized - Excessive beta, generalized  IMPRESSION: This study issuggestive of severe diffuse encephalopathy, nonspecific etiology but could be secondary to sedation. No seizures or epileptiform discharges were seen throughout the recording.  EEG appears similar to yesterday.  Kamarii Buren 

## 2020-04-22 NOTE — Progress Notes (Signed)
NEUROLOGY PROGRESS NOTE  Subjective: Patient remains intubated; however, he is easily aroused.  Intermittently attempts to reach for the endotracheal tube.  Exam: Vitals:   04/22/20 0730 04/22/20 0800  BP:  (!) 80/62  Pulse:  (!) 117  Resp:  (!) 24  Temp:  98.2 F (36.8 C)  SpO2: 100% 100%     Neuro:  Mental Status: Alert, intubated, breathing over the vent, able to follow commands and awakens easily to voice. Cranial Nerves: II:  Visual fields grossly normal-shows me the number of fingers on holding up. PERRL III,IV, VI: ptosis not present, EOMI V,VII: Difficult to assess as he is intubated VIII: Hearing normal bilaterally Motor: Moving all extremities antigravity Sensory: Nods yes to pinprick Deep Tendon Reflexes: 2+ and symmetric throughout   Medications:  Scheduled: . aspirin  81 mg Per Tube Daily  . atorvastatin  40 mg Per Tube Daily  . budesonide (PULMICORT) nebulizer solution  0.5 mg Nebulization BID  . chlorhexidine gluconate (MEDLINE KIT)  15 mL Mouth Rinse BID  . Chlorhexidine Gluconate Cloth  6 each Topical Daily  . docusate  100 mg Per Tube BID  . enoxaparin (LOVENOX) injection  40 mg Subcutaneous Q24H  . folic acid  1 mg Per Tube Daily  . insulin aspart  0-9 Units Subcutaneous Q4H  . ipratropium-albuterol  3 mL Nebulization Q6H  . mouth rinse  15 mL Mouth Rinse 10 times per day  . multivitamin with minerals  1 tablet Per Tube Daily  . nicotine  7 mg Transdermal Daily  . pantoprazole sodium  40 mg Per Tube Q24H  . polyethylene glycol  17 g Per Tube Daily  . predniSONE  20 mg Per Tube Q breakfast  . sodium chloride flush  3 mL Intravenous Q12H  . vitamin B-12  100 mcg Per Tube Daily    Pertinent Labs/Diagnostics:  Overnight EEG with video Result Date: 04/21/2020   IMPRESSION: This study is suggestive of severe diffuse encephalopathy, nonspecific etiology but could be secondary to sedation. One event was recorded on 04/20/2020 for seizure like  activity without any eeg change was like not epileptic. No seizures or epileptiform discharges were seen throughout the recording.  Priyanka Cipriano Mile PA-C [facility] [phone]  Assessment: 64 year old male who presentedwithAMS in the setting ofacute on chronic hypoxic/hypercapnic respiratory failure from COPD exacerbation. He was intubated for progressive respiratory distress.Had seizure like episodes while on precedex.  Currently no further seizures. - Two events recorded on LTM, no eeg change. Both events were when patient was stimulated. Semiology also less likely to be seizure - Differentials include myoclonus, tremulousness related to alcohol withdrawal - Overall impression is that the patient's new-onset seizure-like activity was either due to agitation (non-epileptic events with seizure-like appearance), versus seizures secondary to metabolic disturbance in the setting of acute respiratory failure, versus seizures due to EtOH withdrawal  Recommendations: - Ok to wean off sedation from neurology standpoint - Please contact neurology before administering ativan for any further seizure like episodes.  - Will discontinue LTM as EEG overnight shows no further seizure activity - Seizure precautions - Start Keppra taper as follows: 250 mg qAM/500 mg qHS x 3 days, then 250 mg BID x 3 days, then 250 mg qhs x 3 days, then stop. Can be given as IV or PO.  --Per [facility] statutes, patients with seizures are not allowed to drive until  they have been seizure-free for six months. Use caution  when using heavy equipment or power tools. Avoid working on ladders or at heights. Take showers instead of baths. Ensure the water temperature is not too high on the home water heater. Do not go swimming alone. When caring for infants or small children, sit down when holding, feeding, or changing them to minimize risk of injury to the child in the event you have a seizure.  Also, Maintain good sleep hygiene. Avoid alcohol. - Neurohospitalist service will sign off. Please call if there are any additional questions.   Electronically signed: Dr. Kerney Elbe 04/22/2020, 9:46 AM

## 2020-04-22 NOTE — Evaluation (Signed)
Clinical/Bedside Swallow Evaluation Patient Details  Name: John [name] MRN: [id number] Date of Birth: [birth year]  Today's Date: 04/22/2020 Time: SLP Start Time (ACUTE ONLY): 1405 SLP Stop Time (ACUTE ONLY): 1416 SLP Time Calculation (min) (ACUTE ONLY): 11 min  Past Medical History:  Past Medical History:  Diagnosis Date  . Alcohol abuse 12/27/2010  . Allergic rhinitis, cause unspecified 12/27/2010  . Anal warts 04/11/2012  . CAD, NATIVE VESSEL 10/04/2010  . COPD (chronic obstructive pulmonary disease) (HCC)    "a touch" (09/28/2013)  . History of blood transfusion 03/2012   related to nose bleed  . HYPERLIPIDEMIA-MIXED 10/04/2010  . HYPERTENSION, BENIGN 10/04/2010  . Myocardial infarction (HCC) 09/16/2010  . Pneumonia    "when I was a kid"  . TOBACCO ABUSE 10/04/2010   Past Surgical History:  Past Surgical History:  Procedure Laterality Date  . CARDIAC CATHETERIZATION  ~ 2012  . CORONARY ANGIOPLASTY WITH STENT PLACEMENT  09/17/2010; 09/28/2014   "1; 2"  . FINGER SURGERY Left    "almost cut off" tip of 2nd digit  . LEFT HEART CATHETERIZATION WITH CORONARY ANGIOGRAM N/A 09/28/2014   Procedure: LEFT HEART CATHETERIZATION WITH CORONARY ANGIOGRAM;  Surgeon: Kathleene Hazel, MD;  Location: [facility] CATH LAB;  Service: Cardiovascular;  Laterality: N/A;  . NASAL ENDOSCOPY WITH EPISTAXIS CONTROL Bilateral 03/2012  . VASECTOMY     HPI:  64 yo male smoker MZ A1AT heterozygote with severe COPD/emphysema presented with worsening dyspnea and acute on chronic hypoxic/hypercapnic respiratory failure associated with altered mental status.  Failed Bipap and transferred to ICU 7/28 for intubation. Extubated 7/31.   Assessment / Plan / Recommendation Clinical Impression  Pt presents with clinical indicators of pharyngeal dysphagia in setting of VDRF requiring 3 day intubation.  Pt is aphonic at present. With effort he can general minimal phonation.  Weak volitional cough noted.  Pt tolerated ice chips  which were given by RN and was able to feed himself by spoon.  With thin liquid by cup, there were multiple swallows, and delayed grunting, which pt endorsed as throat clear.  With nectar thick liquid there was immediate strong cough with wet vocal quality.  Pt tolerated puree without overt s/s of aspiration.  Recommend MBSS prior to initiation of PO diet. SLP Visit Diagnosis: Dysphagia, oropharyngeal phase (R13.12)    Aspiration Risk  Moderate aspiration risk    Diet Recommendation NPO   Medication Administration: Via alternative means    Other  Recommendations Oral Care Recommendations: Oral care QID   Follow up Recommendations  (TBD)      Frequency and Duration  (TBD)          Prognosis Prognosis for Safe Diet Advancement: Good      Swallow Study   General HPI: 64 yo male smoker MZ A1AT heterozygote with severe COPD/emphysema presented with worsening dyspnea and acute on chronic hypoxic/hypercapnic respiratory failure associated with altered mental status.  Failed Bipap and transferred to ICU 7/28 for intubation. Extubated 7/31. Type of Study: Bedside Swallow Evaluation Previous Swallow Assessment: None Diet Prior to this Study: NPO Temperature Spikes Noted: No Respiratory Status: Nasal cannula History of Recent Intubation: Yes Length of Intubations (days): 3 days Date extubated: 04/22/20 Behavior/Cognition: Alert;Cooperative;Pleasant mood Oral Cavity Assessment: Within Functional Limits Oral Care Completed by SLP: No Oral Cavity - Dentition: Adequate natural dentition Patient Positioning: Upright in bed Baseline Vocal Quality: Aphonic Volitional Cough: Weak Volitional Swallow: Able to elicit    Oral/Motor/Sensory Function Overall Oral Motor/Sensory Function: Within functional limits  Facial ROM: Within Functional Limits Facial Symmetry: Within Functional Limits Lingual ROM: Within Functional Limits Lingual Symmetry: Within Functional Limits Lingual Strength: Within  Functional Limits Velum: Within Functional Limits Mandible: Within Functional Limits   Ice Chips Ice chips: Within functional limits Presentation: Self Fed   Thin Liquid Thin Liquid: Impaired Presentation: Cup Pharyngeal  Phase Impairments: Multiple swallows;Throat Clearing - Delayed    Nectar Thick Nectar Thick Liquid: Impaired Presentation: Cup Pharyngeal Phase Impairments: Cough - Immediate   Honey Thick Honey Thick Liquid: Not tested   Puree Puree: Within functional limits Presentation: Spoon   Solid     Solid: Not tested      Kerrie Pleasure, MA, CCC-SLP Acute Rehabilitation Services Office: [phone]; Pager: [phone] 04/22/2020,2:47 PM

## 2020-04-22 NOTE — Progress Notes (Signed)
NAME:  John [name], MRN:  [id number], DOB:  Mar 31, 1956, LOS: 4 ADMISSION DATE:  04/18/2020, CONSULTATION DATE:  04/19/2020  REFERRING MD:  Dr. Criselda Peaches, IMTS, CHIEF COMPLAINT:  Short of breath   Brief History   64 yo male smoker MZ A1AT heterozygote with severe COPD/emphysema presented with worsening dyspnea and acute on chronic hypoxic/hypercapnic respiratory failure associated with altered mental status.  Failed Bipap and transferred to ICU 7/28 for intubation.  Past Medical History  PNA, HTN, HLD, CAD  Significant Hospital Events   7/27 Admit 7/28 Transfer to ICU, VDRF 7/29 Seizure x two, start LTM  Consults:  Neurology 7/29  Procedures:  ETT 7/28 >>   Significant Diagnostic Tests:   CT head 7/27 >> no acute findings  EEG 7/28 >> intermittent slowing  Echo 7/28 >> EF 65 to 70%  CT head 7/29 >> no acute findings  EEG 7/30 >> no evidence of seizure activity during events while he was stimulated characterized by tremulousness, possible myoclonus  Micro Data:  COVID 7/27 >> negative MRSA PCR 7/28 >> negative  Antimicrobials:  Doxycycline 7/27 >>   Interim history/subjective:  Some agitation overnight Tachypnea and some breath stacking this morning with associated desaturation New cuff leak detected while he was agitated   Objective   Blood pressure (!) 80/62, pulse (!) 117, temperature 98.2 F (36.8 C), temperature source Oral, resp. rate (!) 24, height 5\' 11"  (1.803 m), weight 71.4 kg, SpO2 100 %.    Vent Mode: PRVC FiO2 (%):  [30 %-60 %] 60 % Set Rate:  [12 bmp] 12 bmp Vt Set:  [560 mL] 560 mL PEEP:  [8 cmH20] 8 cmH20 Plateau Pressure:  [17 cmH20-24 cmH20] 17 cmH20   Intake/Output Summary (Last 24 hours) at 04/22/2020 0936 Last data filed at 04/22/2020 0600 Gross per 24 hour  Intake 4329.23 ml  Output 1060 ml  Net 3269.23 ml   Filed Weights   04/20/20 0200 04/21/20 0413 04/22/20 0500  Weight: 67.5 kg 70.2 kg 71.4 kg    Examination:  General  -ill-appearing man, intubated, sedated Eyes -pupils react ENT -ET tube in place with audible cuff leak especially when tachypneic, breath stacking Cardiac -regular, distant, borderline tachycardic, no murmur Chest -decreased bilateral breath sounds, coarse, small breaths, no wheeze Abdomen -slightly distended and tympanic, positive bowel sounds Extremities -no edema Skin -no rash Neuro -sedated, breathing over set rate, some breath stacking and cough, moves bilateral upper extremities.  Did not wake to voice or follow commands  Chest x-ray from today 7/31 reviewed, hyperinflated with an evolving left midlung rounded opacity  Resolved Hospital Problem list     Assessment & Plan:   Acute on chronic hypoxic/hypercapnic respiratory failure from COPD exacerbation. Tobacco abuse. Hx of severe COPD/emphysema with MZ A1AT heterozygote. Cuff leak -Trying to decrease Ti, RR to give him more comfortable respiratory pattern, avoid intrinsic PEEP -Follow cuff integrity as we make his respiratory pattern more comfortable.  If he continues to have a leak we may need to consider tube exchange -Continue DuoNeb, Pulmicort as ordered -Nicotine patch in place -Prednisone 20 mg daily, plan slow wean -Doxycycline day 5 of 7  Acute metabolic encephalopathy. Witnessed seizure x 2 on 04/20/20. Hx of ETOH >> his girlfriend and children report he stopped drinking in August 2020. -Continue try to wean his sedating medication, Precedex, fentanyl and Versed.  RASS goal -1 -Continue Keppra pending neurology recommendations -No overt seizure activity detected on LTM while he was twitching, question myoclonus, question  possible contribution withdrawal.  May not need to continue the Keppra -Thiamine, folate, MVI  Steroid induced hyperglycemia. -Slight scale insulin as ordered  Hx of CAD, HLD. -Continue Lipitor, aspirin  Hypotension from sedation and hypovolemia.  Improved -Continue LR 75 cc/h, consider  decrease 7/31 with improved blood pressure  Abdominal distention, consider ileus. Constipation, no BM -Increase bowel regimen, add Dulcolax   Hypokalemia.  Improved -Follow BMP  Thrombocytopenia. -Continue to follow CBC  Best practice:  Diet: tube feeds DVT prophylaxis: lovenox GI prophylaxis: protonix Mobility: bed rest Code Status: full code Disposition: ICU  Discussed with the patient's girlfriend and son at bedside on 7/31  Labs    CMP Latest Ref Rng & Units 04/22/2020 04/21/2020 04/21/2020  Glucose 70 - 99 mg/dL 98 - -  BUN 8 - 23 mg/dL 30(Q) - -  Creatinine 6.57 - 1.24 mg/dL 8.46 - -  Sodium 962 - 145 mmol/L 139 139 140  Potassium 3.5 - 5.1 mmol/L 4.4 4.2 3.3(L)  Chloride 98 - 111 mmol/L 102 - -  CO2 22 - 32 mmol/L 32 - -  Calcium 8.9 - 10.3 mg/dL 9.0 - -  Total Protein 6.5 - 8.1 g/dL - - -  Total Bilirubin 0.3 - 1.2 mg/dL - - -  Alkaline Phos 38 - 126 U/L - - -  AST 15 - 41 U/L - - -  ALT 0 - 44 U/L - - -    CBC Latest Ref Rng & Units 04/22/2020 04/21/2020 04/21/2020  WBC 4.0 - 10.5 K/uL 6.8 - -  Hemoglobin 13.0 - 17.0 g/dL 11.8(L) 11.9(L) 11.9(L)  Hematocrit 39 - 52 % 36.6(L) 35.0(L) 35.0(L)  Platelets 150 - 400 K/uL 87(L) - -    ABG    Component Value Date/Time   PHART 7.482 (H) 04/21/2020 1141   PCO2ART 48.3 (H) 04/21/2020 1141   PO2ART 75 (L) 04/21/2020 1141   HCO3 36.5 (H) 04/21/2020 1141   TCO2 38 (H) 04/21/2020 1141   O2SAT 96.0 04/21/2020 1141   CBG (last 3)  Recent Labs    04/21/20 2349 04/22/20 0340 04/22/20 0841  GLUCAP 101* 99 102*    Critical care time: 34 minutes   Levy Pupa, MD, PhD 04/22/2020, 9:47 AM [facility] [phone] or if no answer [phone]

## 2020-04-22 NOTE — Procedures (Signed)
Extubation Procedure Note  Patient Details:   Name: BRIAN ZEITLIN DOB: Oct 14, 1955 MRN: [id number]   Airway Documentation:    Vent end date: 04/22/20 Vent end time: 1055   Evaluation  O2 sats: stable throughout Complications: No apparent complications Patient did tolerate procedure well. Bilateral Breath Sounds: Diminished   Yes   Pt was extubated to 2L [place] per MD order. Pt able to tolerate well. Pt was suctioned and had a positive cuff leak prior to extubation. Pt able to speak and cough afterwards. Pt aware to person and place at this time. Pt sats currently at 98%. No stridor was heard post extubation. RT will continue to monitor.   Demarri Elie A Cera Rorke 04/22/2020, 11:02 AM

## 2020-04-22 NOTE — Progress Notes (Signed)
eLink Physician-Brief Progress Note Patient Name: John [name] DOB: [birth year] MRN: [id number]   Date of Service  04/22/2020  HPI/Events of Note  Abdominal distention.  eICU Interventions  Plan: 1. NGT to LIS. 2. Bladder scan now.       Intervention Category Major Interventions: Other:  Lenell Antu 04/22/2020, 12:54 AM

## 2020-04-23 ENCOUNTER — Inpatient Hospital Stay (HOSPITAL_COMMUNITY): Payer: Self-pay

## 2020-04-23 DIAGNOSIS — R569 Unspecified convulsions: Secondary | ICD-10-CM

## 2020-04-23 LAB — GLUCOSE, CAPILLARY
Glucose-Capillary: 121 mg/dL — ABNORMAL HIGH (ref 70–99)
Glucose-Capillary: 57 mg/dL — ABNORMAL LOW (ref 70–99)
Glucose-Capillary: 58 mg/dL — ABNORMAL LOW (ref 70–99)
Glucose-Capillary: 62 mg/dL — ABNORMAL LOW (ref 70–99)
Glucose-Capillary: 70 mg/dL (ref 70–99)
Glucose-Capillary: 70 mg/dL (ref 70–99)
Glucose-Capillary: 73 mg/dL (ref 70–99)
Glucose-Capillary: 80 mg/dL (ref 70–99)
Glucose-Capillary: 84 mg/dL (ref 70–99)
Glucose-Capillary: 86 mg/dL (ref 70–99)

## 2020-04-23 LAB — BASIC METABOLIC PANEL
Anion gap: 6 (ref 5–15)
BUN: 25 mg/dL — ABNORMAL HIGH (ref 8–23)
CO2: 33 mmol/L — ABNORMAL HIGH (ref 22–32)
Calcium: 8.9 mg/dL (ref 8.9–10.3)
Chloride: 102 mmol/L (ref 98–111)
Creatinine, Ser: 0.75 mg/dL (ref 0.61–1.24)
GFR calc Af Amer: >60 mL/min (ref >60)
GFR calc non Af Amer: >60 mL/min (ref >60)
Glucose, Bld: 91 mg/dL (ref 70–99)
Potassium: 4 mmol/L (ref 3.5–5.1)
Sodium: 141 mmol/L (ref 135–145)

## 2020-04-23 LAB — CBC
HCT: 37.5 % — ABNORMAL LOW (ref 39.0–52.0)
Hemoglobin: 12 g/dL — ABNORMAL LOW (ref 13.0–17.0)
MCH: 34.5 pg — ABNORMAL HIGH (ref 26.0–34.0)
MCHC: 32 g/dL (ref 30.0–36.0)
MCV: 107.8 fL — ABNORMAL HIGH (ref 80.0–100.0)
Platelets: 112 10*3/uL — ABNORMAL LOW (ref 150–400)
RBC: 3.48 MIL/uL — ABNORMAL LOW (ref 4.22–5.81)
RDW: 13.4 % (ref 11.5–15.5)
WBC: 10.8 10*3/uL — ABNORMAL HIGH (ref 4.0–10.5)
nRBC: 0 % (ref 0.0–0.2)

## 2020-04-23 LAB — MAGNESIUM: Magnesium: 1.6 mg/dL — ABNORMAL LOW (ref 1.7–2.4)

## 2020-04-23 LAB — PTH-RELATED PEPTIDE: PTH-related peptide: <2 pmol/L

## 2020-04-23 MED ORDER — FOLIC ACID 1 MG PO TABS
1.0000 mg | ORAL_TABLET | Freq: Every day | ORAL | Status: DC
  Administered 2020-04-24 – 2020-04-25 (×2): 1 mg via ORAL
  Filled 2020-04-23 (×2): qty 1

## 2020-04-23 MED ORDER — ADULT MULTIVITAMIN W/MINERALS CH
1.0000 | ORAL_TABLET | Freq: Every day | ORAL | Status: DC
  Administered 2020-04-24 – 2020-04-25 (×2): 1 via ORAL
  Filled 2020-04-23 (×2): qty 1

## 2020-04-23 MED ORDER — LEVETIRACETAM 250 MG PO TABS: 250.0000 mg | ORAL_TABLET | Freq: Every day | ORAL | Status: DC

## 2020-04-23 MED ORDER — ORAL CARE MOUTH RINSE
15.0000 mL | Freq: Two times a day (BID) | OROMUCOSAL | Status: DC
  Administered 2020-04-24 – 2020-04-25 (×3): 15 mL via OROMUCOSAL

## 2020-04-23 MED ORDER — LEVETIRACETAM 500 MG PO TABS
500.0000 mg | ORAL_TABLET | Freq: Every day | ORAL | Status: DC
  Administered 2020-04-23 – 2020-04-24 (×2): 500 mg via ORAL
  Filled 2020-04-23 (×2): qty 1

## 2020-04-23 MED ORDER — PREDNISONE 20 MG PO TABS
20.0000 mg | ORAL_TABLET | Freq: Every day | ORAL | Status: DC
  Administered 2020-04-24 – 2020-04-25 (×2): 20 mg via ORAL
  Filled 2020-04-23 (×2): qty 1

## 2020-04-23 MED ORDER — ACETAMINOPHEN 325 MG PO TABS: 650.0000 mg | ORAL_TABLET | Freq: Four times a day (QID) | ORAL | Status: DC | PRN

## 2020-04-23 MED ORDER — ASPIRIN 81 MG PO CHEW
81.0000 mg | CHEWABLE_TABLET | Freq: Every day | ORAL | Status: DC
  Administered 2020-04-24 – 2020-04-25 (×2): 81 mg via ORAL
  Filled 2020-04-23 (×2): qty 1

## 2020-04-23 MED ORDER — LEVETIRACETAM 250 MG PO TABS: 250.0000 mg | ORAL_TABLET | Freq: Two times a day (BID) | ORAL | Status: DC

## 2020-04-23 MED ORDER — INSULIN ASPART 100 UNIT/ML ~~LOC~~ SOLN
0.0000 [IU] | Freq: Three times a day (TID) | SUBCUTANEOUS | Status: DC
  Administered 2020-04-24 – 2020-04-25 (×2): 2 [IU] via SUBCUTANEOUS

## 2020-04-23 MED ORDER — INSULIN ASPART 100 UNIT/ML ~~LOC~~ SOLN: 0.0000 [IU] | Freq: Every day | SUBCUTANEOUS | Status: DC

## 2020-04-23 MED ORDER — LEVETIRACETAM 250 MG PO TABS
250.0000 mg | ORAL_TABLET | Freq: Every morning | ORAL | Status: DC
  Administered 2020-04-24 – 2020-04-25 (×2): 250 mg via ORAL
  Filled 2020-04-23 (×2): qty 1

## 2020-04-23 MED ORDER — ACETAMINOPHEN 650 MG RE SUPP: 650.0000 mg | Freq: Four times a day (QID) | RECTAL | Status: DC | PRN

## 2020-04-23 MED ORDER — VITAMIN B-12 100 MCG PO TABS
100.0000 ug | ORAL_TABLET | Freq: Every day | ORAL | Status: DC
  Administered 2020-04-24 – 2020-04-25 (×2): 100 ug via ORAL
  Filled 2020-04-23 (×2): qty 1

## 2020-04-23 MED ORDER — ATORVASTATIN CALCIUM 40 MG PO TABS
40.0000 mg | ORAL_TABLET | Freq: Every day | ORAL | Status: DC
  Administered 2020-04-24 – 2020-04-25 (×2): 40 mg via ORAL
  Filled 2020-04-23 (×2): qty 1

## 2020-04-23 NOTE — Progress Notes (Signed)
eLink Physician-Brief Progress Note Patient Name: John [name] DOB: 1956/08/03 MRN: [id number]   Date of Service  04/23/2020  HPI/Events of Note  Hyperglycemia - Patient now on diet and on Q 4 hour sensitive Novolog SSI.  eICU Interventions  Plan: 1. Change SSI to AC/HS sensitive Novolog SSI.      Intervention Category Major Interventions: Hyperglycemia - active titration of insulin therapy  Lenell Antu 04/23/2020, 8:17 PM

## 2020-04-23 NOTE — Progress Notes (Signed)
NAME:  ROSA GAMBALE, MRN:  [id number], DOB:  [birth year], LOS: 5 ADMISSION DATE:  04/18/2020, CONSULTATION DATE:  04/19/2020  REFERRING MD:  Dr. Criselda Peaches, IMTS, CHIEF COMPLAINT:  Short of breath   Brief History   64 yo male smoker MZ A1AT heterozygote with severe COPD/emphysema presented with worsening dyspnea and acute on chronic hypoxic/hypercapnic respiratory failure associated with altered mental status.  Failed Bipap and transferred to ICU 7/28 for intubation.  Past Medical History  PNA, HTN, HLD, CAD  Significant Hospital Events   7/27 Admit 7/28 Transfer to ICU, VDRF 7/29 Seizure x two, start LTM  Consults:  Neurology 7/29  Procedures:  ETT 7/28 >> 7/31  Significant Diagnostic Tests:   CT head 7/27 >> no acute findings  EEG 7/28 >> intermittent slowing  Echo 7/28 >> EF 65 to 70%  CT head 7/29 >> no acute findings  EEG 7/30 >> no evidence of seizure activity during events while he was stimulated characterized by tremulousness, possible myoclonus  Micro Data:  COVID 7/27 >> negative MRSA PCR 7/28 >> negative  Antimicrobials:  Doxycycline 7/27 >>   Interim history/subjective:   Extubated successfully 7/31 Comfortable, eating breakfast   Objective   Blood pressure (!) 114/63, pulse (!) 113, temperature 98 F (36.7 C), temperature source Oral, resp. rate 18, height 5\' 11"  (1.803 m), weight 77 kg, SpO2 97 %.        Intake/Output Summary (Last 24 hours) at 04/23/2020 1006 Last data filed at 04/23/2020 0900 Gross per 24 hour  Intake 1729.34 ml  Output 1575 ml  Net 154.34 ml   Filed Weights   04/21/20 0413 04/22/20 0500 04/23/20 0500  Weight: 70.2 kg 71.4 kg 77 kg    Examination:  General -chronically ill, more comfortable today, interacting Eyes -pupils react ENT -no oral lesions, moist oropharynx Cardiac -regular, distant, no murmur Chest -distant but comfortable respiratory pattern and no wheezing, no crackles Abdomen -nondistended, positive  bowel sounds Extremities -no edema Skin -no rash Neuro -awake, alert, interacting appropriately, following commands.  Strong voice, strong cough, no difficulty with secretions  Chest x-ray from today 8/1 reviewed by me, some improvement in his medial left midlung opacity, presumed pneumonia  Resolved Hospital Problem list     Assessment & Plan:   Acute on chronic hypoxic/hypercapnic respiratory failure from COPD exacerbation. Left-sided CAP Tobacco abuse. Hx of severe COPD/emphysema with MZ A1AT heterozygote. Cuff leak -Push pulmonary hygiene -Continue DuoNeb, Pulmicort as ordered -Nicotine patch -Prednisone 20 mg daily, plan to wean slowly, next change 8/3 -Doxycycline day 6 of 7  Acute metabolic encephalopathy. Witnessed seizure x 2 on 04/20/20. Hx of ETOH >> his girlfriend and children report he stopped drinking in August 2020. -All sedating medications -Appreciate neurology assistance, planning for Keppra wean: 250/500 mg for 3 days, then to 50 mg twice daily for 3 days, then stop -No overt seizure activity, no evidence currently of withdrawal.  Consider initiate CIWA coverage if he shows signs of withdrawal. -DC Versed and Ativan -Thiamine, folate, MVI  Steroid induced hyperglycemia. -Sliding-scale insulin as ordered  Hx of CAD, HLD. -Continue Lipitor, aspirin  Hypotension from sedation and hypovolemia.  Improved -KVO IV fluids  Abdominal distention, consider ileus.  Improved Constipation, no BM -Increase bowel regimen, added Dulcolax on 7/31  Hypokalemia.  Improved -Following BMP  Thrombocytopenia. -Following CBC  Best practice:  Diet: Cleared for p.o. diet DVT prophylaxis: lovenox GI prophylaxis: protonix Mobility: bed rest Code Status: full code Disposition: Will transfer to MedSurg  on 8/1  Discussed with the patient's girlfriend on 8/1  Labs    CMP Latest Ref Rng & Units 04/23/2020 04/22/2020 04/21/2020  Glucose 70 - 99 mg/dL 91 98 -  BUN 8 - 23  mg/dL 73(A) 19(F) -  Creatinine 0.61 - 1.24 mg/dL 7.90 2.40 -  Sodium 973 - 145 mmol/L 141 139 139  Potassium 3.5 - 5.1 mmol/L 4.0 4.4 4.2  Chloride 98 - 111 mmol/L 102 102 -  CO2 22 - 32 mmol/L 33(H) 32 -  Calcium 8.9 - 10.3 mg/dL 8.9 9.0 -  Total Protein 6.5 - 8.1 g/dL - - -  Total Bilirubin 0.3 - 1.2 mg/dL - - -  Alkaline Phos 38 - 126 U/L - - -  AST 15 - 41 U/L - - -  ALT 0 - 44 U/L - - -    CBC Latest Ref Rng & Units 04/23/2020 04/22/2020 04/21/2020  WBC 4.0 - 10.5 K/uL 10.8(H) 6.8 -  Hemoglobin 13.0 - 17.0 g/dL 12.0(L) 11.8(L) 11.9(L)  Hematocrit 39 - 52 % 37.5(L) 36.6(L) 35.0(L)  Platelets 150 - 400 K/uL 112(L) 87(L) -    ABG    Component Value Date/Time   PHART 7.482 (H) 04/21/2020 1141   PCO2ART 48.3 (H) 04/21/2020 1141   PO2ART 75 (L) 04/21/2020 1141   HCO3 36.5 (H) 04/21/2020 1141   TCO2 38 (H) 04/21/2020 1141   O2SAT 96.0 04/21/2020 1141   CBG (last 3)  Recent Labs    04/23/20 0516 04/23/20 0639 04/23/20 0748  GLUCAP 80 73 70    Critical care time: NA   Levy Pupa, MD, PhD 04/23/2020, 10:06 AM [facility] [phone] or if no answer [phone]

## 2020-04-23 NOTE — Evaluation (Signed)
Physical Therapy Evaluation Patient Details Name: John [name] MRN: [id number] DOB: 05-20-1956 Today's Date: 04/23/2020   History of Present Illness  Pt adm with acute on chronic respiratory failure due to copd exacerbation. Pt also with AMS. Pt intubated 7/28 and extubated 7/31. Pt with seizure x 2 on 7/29. PMH - copd, cad, htn  Clinical Impression  Pt presents to PT with decr mobility and decr functional activity tolerance due to illness and inactivity on top of baseline limitations. Expect pt will make steady progress toward return to baseline. Pt with supportive family who can assist at home as needed. Expect pt will be able to return home with family.     Follow Up Recommendations Home health PT;Supervision for mobility/OOB    Equipment Recommendations  Other (comment) (rollator)    Recommendations for Other Services       Precautions / Restrictions        Mobility  Bed Mobility Overal bed mobility: Needs Assistance Bed Mobility: Supine to Sit     Supine to sit: Supervision     General bed mobility comments: Assist for lines.  Transfers Overall transfer level: Needs assistance Equipment used: 4-wheeled walker Transfers: Sit to/from Stand Sit to Stand: Min assist         General transfer comment: Assist to steady. Verbal cues for hand placement  Ambulation/Gait Ambulation/Gait assistance: Min assist Gait Distance (Feet): 15 Feet Assistive device: 4-wheeled walker Gait Pattern/deviations: Step-through pattern;Decreased step length - right;Decreased step length - left;Shuffle;Trunk flexed Gait velocity: decr Gait velocity interpretation: <1.31 ft/sec, indicative of household ambulator General Gait Details: Assist for balance and support.   Stairs            Wheelchair Mobility    Modified Rankin (Stroke Patients Only)       Balance Overall balance assessment: Needs assistance Sitting-balance support: No upper extremity supported;Feet  supported Sitting balance-Leahy Scale: Good     Standing balance support: Bilateral upper extremity supported Standing balance-Leahy Scale: Poor Standing balance comment: rollator and min guard for static standing                             Pertinent Vitals/Pain Pain Assessment: No/denies pain    Home Living Family/patient expects to be discharged to:: Private residence Living Arrangements: Spouse/significant other (girlfriend x 20+years) Available Help at Discharge: Family;Available 24 hours/day Type of Home: House Home Access: Stairs to enter Entrance Stairs-Rails: Right Entrance Stairs-Number of Steps: 2-3 Home Layout: One level Home Equipment: Other (comment) Additional Comments: home O2    Prior Function Level of Independence: Needs assistance;Independent   Gait / Transfers Assistance Needed: Modified independent with short distance amb without assistive device. Home O2 at 2.5L     Comments: No falls in past 6 months.     Hand Dominance        Extremity/Trunk Assessment   Upper Extremity Assessment Upper Extremity Assessment: Defer to OT evaluation    Lower Extremity Assessment Lower Extremity Assessment: Generalized weakness       Communication   Communication: No difficulties  Cognition Arousal/Alertness: Awake/alert Behavior During Therapy: WFL for tasks assessed/performed Overall Cognitive Status: Within Functional Limits for tasks assessed                                        General Comments General comments (skin integrity, edema, etc.): Pt  on 3L of O2 with amb. SpO2 to 83%. Pt with rest and pursed lip breathing and return to > 90% after 1 minute    Exercises     Assessment/Plan    PT Assessment Patient needs continued PT services  PT Problem List Decreased strength;Decreased activity tolerance;Decreased balance;Decreased mobility;Cardiopulmonary status limiting activity       PT Treatment Interventions  DME instruction;Gait training;Functional mobility training;Therapeutic activities;Therapeutic exercise;Stair training;Balance training;Patient/family education    PT Goals (Current goals can be found in the Care Plan section)  Acute Rehab PT Goals Patient Stated Goal: get up PT Goal Formulation: With patient Time For Goal Achievement: 05/07/20 Potential to Achieve Goals: Good    Frequency Min 3X/week   Barriers to discharge        Co-evaluation               AM-PAC PT "6 Clicks" Mobility  Outcome Measure Help needed turning from your back to your side while in a flat bed without using bedrails?: None Help needed moving from lying on your back to sitting on the side of a flat bed without using bedrails?: None Help needed moving to and from a bed to a chair (including a wheelchair)?: A Little Help needed standing up from a chair using your arms (e.g., wheelchair or bedside chair)?: A Little Help needed to walk in hospital room?: A Little Help needed climbing 3-5 steps with a railing? : A Lot 6 Click Score: 19    End of Session Equipment Utilized During Treatment: Gait belt;Oxygen Activity Tolerance: Patient limited by fatigue Patient left: in chair;with call bell/phone within reach;with family/visitor present Nurse Communication: Mobility status PT Visit Diagnosis: Unsteadiness on feet (R26.81);Muscle weakness (generalized) (M62.81)    Time: 7408-1448 PT Time Calculation (min) (ACUTE ONLY): 21 min   Charges:   PT Evaluation $PT Eval Moderate Complexity: 1 Mod          [facility] PT Acute Rehabilitation Services Pager [phone] Office [phone]   Angelina Ok [facility] 04/23/2020, 9:06 AM

## 2020-04-23 NOTE — Evaluation (Signed)
Occupational Therapy Evaluation Patient Details Name: John [name] MRN: [id number] DOB: Mar 01, 1956 Today's Date: 04/23/2020    History of Present Illness Pt adm with acute on chronic respiratory failure due to copd exacerbation. Pt also with AMS. Pt intubated 7/28 and extubated 7/31. Pt with seizure x 2 on 7/29. PMH - copd, cad, htn   Clinical Impression   PTA, pt was living at home with his girlfriend of 20+ years. Pt was independent ADL/IADL and functional mobility. Pt reports decreased activity tolerance at baseline. Pt currently demonstrates decreased endurance, decreased activity tolerance and increased DoE. He required minA for functional mobility at RW level and minA for LB dressing. Began education on energy conservation strategies, importance of maintaining SpO2 >92% and activity progression. Anticipate pt will continue to progress and be able to return home with his girlfriend with HHOT. Will continue to follow acutely.     Follow Up Recommendations  Home health OT;Supervision/Assistance - 24 hour    Equipment Recommendations  3 in 1 bedside commode    Recommendations for Other Services       Precautions / Restrictions Precautions Precautions: Fall Precaution Comments: watch O2 and HR Restrictions Weight Bearing Restrictions: No      Mobility Bed Mobility Overal bed mobility: Needs Assistance Bed Mobility: Sit to Supine       Sit to supine: Supervision   General bed mobility comments: supervision for safety and assist with line management  Transfers Overall transfer level: Needs assistance Equipment used: Rolling walker (2 wheeled) Transfers: Sit to/from Stand Sit to Stand: Min assist         General transfer comment: minA for safety and to stabilize;pt will benefit from rollator    Balance Overall balance assessment: Needs assistance Sitting-balance support: No upper extremity supported;Feet supported Sitting balance-Leahy Scale: Good      Standing balance support: Bilateral upper extremity supported Standing balance-Leahy Scale: Poor Standing balance comment: reliant on BUE support                           ADL either performed or assessed with clinical judgement   ADL Overall ADL's : Needs assistance/impaired Eating/Feeding: Set up;Sitting   Grooming: Min guard;Sitting   Upper Body Bathing: Min guard;Sitting   Lower Body Bathing: Minimal assistance;Sit to/from stand   Upper Body Dressing : Min guard;Sitting   Lower Body Dressing: Minimal assistance;Sit to/from stand   Toilet Transfer: Minimal Cabin crew Details (indicate cue type and reason): simulted to recliner and back Toileting- Clothing Manipulation and Hygiene: Minimal assistance;Sit to/from stand       Functional mobility during ADLs: Minimal assistance;Rolling walker General ADL Comments: Pt with decreased activity tolerance SpO2 94%-88% with stand-pivot transfer, HR up to 126bpm with transfer;began education on energy conservation strategies     Vision Baseline Vision/History: Wears glasses Wears Glasses: At all times Patient Visual Report: No change from baseline       Perception     Praxis      Pertinent Vitals/Pain Pain Assessment: No/denies pain Faces Pain Scale: No hurt     Hand Dominance Right   Extremity/Trunk Assessment Upper Extremity Assessment Upper Extremity Assessment: Generalized weakness   Lower Extremity Assessment Lower Extremity Assessment: Generalized weakness   Cervical / Trunk Assessment Cervical / Trunk Assessment: Normal   Communication Communication Communication: No difficulties   Cognition Arousal/Alertness: Awake/alert Behavior During Therapy: WFL for tasks assessed/performed Overall Cognitive Status: Within Functional Limits for tasks assessed  General Comments  Pt on 3LO2, SpO2 94%-88% HR up to 126bpm with  in room mobility    Exercises     Shoulder Instructions      Home Living Family/patient expects to be discharged to:: Private residence Living Arrangements: Spouse/significant other (girlfriend x 20+years) Available Help at Discharge: Family;Available 24 hours/day Type of Home: House Home Access: Stairs to enter [employer] of Steps: 2-3 Entrance Stairs-Rails: Right Home Layout: One level     Bathroom Shower/Tub: Chief Strategy Officer: Standard     Home Equipment: Other (comment)   Additional Comments: home O2      Prior Functioning/Environment Level of Independence: Needs assistance;Independent  Gait / Transfers Assistance Needed: Modified independent with short distance amb without assistive device. Home O2 at 2.5L ADL's / Homemaking Assistance Needed: pt reports he was independent   Comments: No falls in past 6 months.        OT Problem List: Decreased activity tolerance;Impaired balance (sitting and/or standing);Decreased knowledge of precautions;Decreased knowledge of use of DME or AE;Cardiopulmonary status limiting activity      OT Treatment/Interventions: Therapeutic exercise;Self-care/ADL training;Energy conservation;DME and/or AE instruction;Therapeutic activities;Patient/family education;Balance training    OT Goals(Current goals can be found in the care plan section) Acute Rehab OT Goals Patient Stated Goal: to get stronger and go home OT Goal Formulation: With patient Time For Goal Achievement: 05/07/20 Potential to Achieve Goals: Good ADL Goals Pt Will Perform Grooming: with modified independence;standing;sitting Pt Will Perform Lower Body Dressing: with modified independence;sit to/from stand;with adaptive equipment Pt Will Transfer to Toilet: with modified independence;ambulating Additional ADL Goal #1: Pt will demonstrate independence with use of 3 energy conservation strategies during ADL/IADL and functional mobility  completion.  OT Frequency: Min 2X/week   Barriers to D/C:            Co-evaluation              AM-PAC OT "6 Clicks" Daily Activity     Outcome Measure Help from another person eating meals?: A Little Help from another person taking care of personal grooming?: A Little Help from another person toileting, which includes using toliet, bedpan, or urinal?: A Little Help from another person bathing (including washing, rinsing, drying)?: A Little Help from another person to put on and taking off regular upper body clothing?: A Little Help from another person to put on and taking off regular lower body clothing?: A Little 6 Click Score: 18   End of Session Equipment Utilized During Treatment: Gait belt;Oxygen Nurse Communication: Mobility status  Activity Tolerance: Patient tolerated treatment well;Patient limited by fatigue Patient left: in bed;with bed alarm set;with call bell/phone within reach;with family/visitor present  OT Visit Diagnosis: Unsteadiness on feet (R26.81);Other abnormalities of gait and mobility (R26.89);Muscle weakness (generalized) (M62.81)                Time: 6387-5643 OT Time Calculation (min): 28 min Charges:  OT General Charges $OT Visit: 1 Visit OT Evaluation $OT Eval Moderate Complexity: 1 Mod OT Treatments $Self Care/Home Management : 8-22 mins  Rosey Bath OTR/L Acute Rehabilitation Services Office: [phone]   Rebeca Alert 04/23/2020, 3:55 PM

## 2020-04-24 ENCOUNTER — Ambulatory Visit: Payer: Self-pay | Admitting: Physician Assistant

## 2020-04-24 LAB — GLUCOSE, CAPILLARY
Glucose-Capillary: 74 mg/dL (ref 70–99)
Glucose-Capillary: 107 mg/dL — ABNORMAL HIGH (ref 70–99)
Glucose-Capillary: 176 mg/dL — ABNORMAL HIGH (ref 70–99)
Glucose-Capillary: 131 mg/dL — ABNORMAL HIGH (ref 70–99)

## 2020-04-24 LAB — CULTURE, RESPIRATORY W GRAM STAIN: Culture: NORMAL

## 2020-04-24 LAB — HEMOGLOBIN A1C
Hgb A1c MFr Bld: 5.1 % (ref 4.8–5.6)
Mean Plasma Glucose: 99.67 mg/dL

## 2020-04-24 MED ORDER — IPRATROPIUM-ALBUTEROL 0.5-2.5 (3) MG/3ML IN SOLN
3.0000 mL | Freq: Two times a day (BID) | RESPIRATORY_TRACT | Status: DC
  Administered 2020-04-24 – 2020-04-25 (×2): 3 mL via RESPIRATORY_TRACT
  Filled 2020-04-24 (×2): qty 3

## 2020-04-24 MED ORDER — ENSURE ENLIVE PO LIQD
237.0000 mL | Freq: Two times a day (BID) | ORAL | Status: DC
  Administered 2020-04-24 – 2020-04-25 (×2): 237 mL via ORAL

## 2020-04-24 NOTE — Plan of Care (Signed)

## 2020-04-24 NOTE — Progress Notes (Signed)
Physical Therapy Treatment Patient Details Name: John [name] MRN: [id number] DOB: [birth year] Today's Date: 04/24/2020    History of Present Illness Pt adm with acute on chronic respiratory failure due to copd exacerbation. Pt also with AMS. Pt intubated 7/28 and extubated 7/31. Pt with seizure x 2 on 7/29. PMH - copd, cad, htn    PT Comments    Patient received sitting at EOB, very pleasant and participatory in PT this afternoon. Able to perform at a S-min guard level however continues to require cues for safety/hand placement with functional transfers and functional energy conservation with dynamic activity. Able to progress gait distance significantly, however did have SPO2 desat down to 85% on 3LPM O2, did recover to 93% and greater with seated rest/PLB. HR no more than 115BPM during session. Left sitting at EOB per his request, all needs met and daughter present.     Follow Up Recommendations  Home health PT;Supervision for mobility/OOB     Equipment Recommendations  Other (comment) (rollator)    Recommendations for Other Services       Precautions / Restrictions Precautions Precautions: Fall Precaution Comments: watch O2 and HR Restrictions Weight Bearing Restrictions: No    Mobility  Bed Mobility               General bed mobility comments: sitting EOB on entry and exit  Transfers Overall transfer level: Needs assistance Equipment used: Rolling walker (2 wheeled) Transfers: Sit to/from Stand Sit to Stand: Supervision Stand pivot transfers: Min guard       General transfer comment: S for functional transfers as well as cues for hand placement and safety due to tendency to pull on RW or "plop" down to the chair/bed when sitting  Ambulation/Gait Ambulation/Gait assistance: Min guard Gait Distance (Feet): 50 Feet Assistive device: Rolling walker (2 wheeled) Gait Pattern/deviations: Step-through pattern;Decreased step length - right;Decreased step  length - left;Shuffle;Trunk flexed Gait velocity: decr   General Gait Details: min guard for safety, otherwise fatigued but much more steady and stable   Stairs             Wheelchair Mobility    Modified Rankin (Stroke Patients Only)       Balance Overall balance assessment: Needs assistance Sitting-balance support: No upper extremity supported;Feet supported Sitting balance-Leahy Scale: Good     Standing balance support: Bilateral upper extremity supported Standing balance-Leahy Scale: Fair Standing balance comment: reliant on BUE support                            Cognition Arousal/Alertness: Awake/alert Behavior During Therapy: WFL for tasks assessed/performed Overall Cognitive Status: Within Functional Limits for tasks assessed                                        Exercises      General Comments General comments (skin integrity, edema, etc.): on 3LPM O2 during session; at 95% at rest on 3LPM O2, did desat to 85% with gait on 3LPM O2 but able to recover well with seated rest/PLB, HR only to 115BPM but quickly recovered. Tremulous and fatigued but very cooperative.      Pertinent Vitals/Pain Pain Assessment: No/denies pain Pain Score: 0-No pain Faces Pain Scale: No hurt Pain Intervention(s): Limited activity within patient's tolerance;Monitored during session    Home Living  Prior Function            PT Goals (current goals can now be found in the care plan section) Acute Rehab PT Goals Patient Stated Goal: to get stronger and go home PT Goal Formulation: With patient Time For Goal Achievement: 05/07/20 Potential to Achieve Goals: Good Progress towards PT goals: Progressing toward goals    Frequency    Min 3X/week      PT Plan Current plan remains appropriate    Co-evaluation              AM-PAC PT "6 Clicks" Mobility   Outcome Measure  Help needed turning from your back  to your side while in a flat bed without using bedrails?: None Help needed moving from lying on your back to sitting on the side of a flat bed without using bedrails?: None Help needed moving to and from a bed to a chair (including a wheelchair)?: A Little Help needed standing up from a chair using your arms (e.g., wheelchair or bedside chair)?: A Little Help needed to walk in hospital room?: A Little Help needed climbing 3-5 steps with a railing? : A Little 6 Click Score: 20    End of Session Equipment Utilized During Treatment: Oxygen Activity Tolerance: Patient tolerated treatment well Patient left: in bed;with call bell/phone within reach;with family/visitor present (sitting at EOB per his request)   PT Visit Diagnosis: Unsteadiness on feet (R26.81);Muscle weakness (generalized) (M62.81)     Time: 2575-0518 PT Time Calculation (min) (ACUTE ONLY): 10 min  Charges:  $Gait Training: 8-22 mins                     Windell Norfolk, DPT, PN1   Supplemental Physical Therapist [place]    Pager [phone] Acute Rehab Office [phone]

## 2020-04-24 NOTE — Progress Notes (Signed)
Nutrition Follow Up  DOCUMENTATION CODES:   Severe malnutrition in context of chronic illness  INTERVENTION:    Ensure Enlive po BID, each supplement provides 350 kcal and 20 grams of protein  MVI daily   NUTRITION DIAGNOSIS:   Severe Malnutrition related to chronic illness (COPD) as evidenced by severe muscle depletion, severe fat depletion.  Ongoing  GOAL:   Patient will meet greater than or equal to 90% of their needs   Progressing   MONITOR:   Supplement acceptance, Weight trends, PO intake, I & O's, Labs  REASON FOR ASSESSMENT:   Malnutrition Screening Tool    ASSESSMENT:   64 year old male who presented to the ED on 7/27 with SOB and palpitations. PMH of COPD, EtOH abuse, tobacco abuse, CAD s/p stent x 2, HTN, HLD. Pt admitted with acute COPD exacerbation.   7/28 - pt obtunded, intubated 7/31- extubated   Diet advanced to regular. Pt states appetite is progressing. Consumed ~75% of breakfast this am. Discussed the importance of protein intake for preservation of lean body mass.Drinks Ensure at home and is willing to continue this admission.   Admission weight: 62.7 kg  Current weight: 73.1 kg   I/O: +9,405 ml since admit UOP: 1,000 ml x 24 hrs   Medications: folic acid, SS novolog, MVI with minerals, prednisone, Vit B12 Labs: CBG 57-121  Diet Order:   Diet Order            Diet regular Room service appropriate? Yes with Assist; Fluid consistency: Nectar Thick  Diet effective now                 EDUCATION NEEDS:   Not appropriate for education at this time  Skin:  Skin Assessment: Skin Integrity Issues: Skin Integrity Issues:: Other (Comment) Other: MASD- coccyx, sacrum  Last BM:  8/1  Height:   Ht Readings from Last 1 Encounters:  04/19/20 5\' 11"  (1.803 m)    Weight:   Wt Readings from Last 1 Encounters:  04/24/20 73.1 kg    Ideal Body Weight:  78.2 kg  BMI:  Body mass index is 22.48 kg/m.  Estimated Nutritional Needs:    Kcal:  2200-2400 kcal  Protein:  110-125 grams  Fluid:  >/= 2.2 L/day   06/24/20 RD, LDN Clinical Nutrition Pager listed in AMION

## 2020-04-24 NOTE — Plan of Care (Signed)

## 2020-04-24 NOTE — TOC Initial Note (Signed)
Transition of Care [facility]) - Initial/Assessment Note    Patient Details  Name: John [name] MRN: [id number] Date of Birth: [birth year]  [facility] CM/SW Contact:    Joanne Chars, LCSW Phone Number: 04/24/2020, 11:11 AM  Clinical Narrative:     CSW met with pt to discuss discharge plan, daughter also in room.  Pt initially states he does not want HH services but daughter encouraged him to accept and pt agrees.  Pt uninsured, discussed options.  Discussed equipment needs and pt reports he has walker, bedside commode, and shower chair, does not need anything else.  Pt son will provide ride home.  PCP in place.              Expected Discharge Plan: [place] Barriers to Discharge: Continued Medical Work up   Patient Goals and CMS Choice Patient states their goals for this hospitalization and ongoing recovery are:: get moving more CMS Medicare.gov Compare Post Acute Care list provided to:: Patient Choice offered to / list presented to : Patient  Expected Discharge Plan and Services Expected Discharge Plan: [place] Choice: [place] arrangements for the past 2 months: Single Family Home                                      Prior Living Arrangements/Services Living arrangements for the past 2 months: Single Family Home Lives with:: Significant Other Patient language and need for interpreter reviewed:: No Do you feel safe going back to the place where you live?: Yes      Need for Family Participation in Patient Care: No (Comment) Care giver support system in place?: Yes (comment)   Criminal Activity/Legal Involvement Pertinent to Current Situation/Hospitalization: No - Comment as needed  Activities of Daily Living Home Assistive Devices/Equipment: Oxygen ADL Screening (condition at time of admission) Patient's cognitive ability adequate to safely complete daily activities?: Yes Is  the patient deaf or have difficulty hearing?: No Does the patient have difficulty seeing, even when wearing glasses/contacts?: No Does the patient have difficulty concentrating, remembering, or making decisions?: No Patient able to express need for assistance with ADLs?: Yes Does the patient have difficulty dressing or bathing?: Yes Independently performs ADLs?: No Communication: Independent Dressing (OT): Needs assistance Is this a change from baseline?: Change from baseline, expected to last <3days Grooming: Needs assistance Is this a change from baseline?: Change from baseline, expected to last <3 days Feeding: Independent Bathing: Needs assistance Is this a change from baseline?: Change from baseline, expected to last <3 days Toileting: Needs assistance Is this a change from baseline?: Change from baseline, expected to last <3 days In/Out Bed: Needs assistance Is this a change from baseline?: Change from baseline, expected to last <3 days Walks in Home: Needs assistance Is this a change from baseline?: Change from baseline, expected to last <3 days Does the patient have difficulty walking or climbing stairs?: Yes Weakness of Legs: None Weakness of Arms/Hands: None  Permission Sought/Granted Permission sought to share information with : Facility Sport and exercise psychologist, Family Supports Permission granted to share information with : Yes, Verbal Permission Granted  Share Information with NAME: Anderson Malta  Permission granted to share info w AGENCY: [place] granted to share info w Relationship: daughter     Emotional Assessment Appearance:: Appears stated age Attitude/Demeanor/Rapport: Engaged Affect (  typically observed): Pleasant Orientation: : Oriented to Self, Oriented to Place, Oriented to  Time, Oriented to Situation Alcohol / Substance Use: Not Applicable Psych Involvement: No (comment)  Admission diagnosis:  Acute respiratory failure with hypercapnia (HCC)  [J96.02] Acute on chronic respiratory failure with hypercapnia (HCC) [J96.22] Altered mental status, unspecified altered mental status type [R41.82] Patient Active Problem List   Diagnosis Date Noted  . Protein-calorie malnutrition, severe 04/19/2020  . Acute respiratory failure with hypercapnia (HCC) 04/18/2020  . Chronic respiratory failure with hypoxia and hypercapnia (HCC) 12/09/2019  . Chronic respiratory failure with hypoxia (HCC) 11/23/2019  . Medication management 11/16/2019  . Sleeping difficulties 11/16/2019  . COPD MZ/ GOLD II spirometry  but 02 dep/ hypercarbic as of 11/2019 12/26/2018  . Exercise hypoxemia 12/26/2018  . Unstable angina (HCC) 09/27/2014  . Anal warts 04/11/2012  . Stented coronary artery   . Hemorrhagic shock (HCC) 03/30/2012  . Epistaxis 03/30/2012  . Dyspnea 01/02/2011  . Preventative health care 12/27/2010  . Alcohol abuse 12/27/2010  . Allergic rhinitis, cause unspecified 12/27/2010  . HYPERLIPIDEMIA-MIXED 10/04/2010  . Cigarette smoker 10/04/2010  . HYPERTENSION, BENIGN 10/04/2010  . CAD, NATIVE VESSEL 10/04/2010   PCP:  Wert, Michael B, MD Pharmacy:   [facility] - [place], [place] - [address] [place] [address] Phone: [phone] Fax: [phone]  [id number] - [place], [place] - [address] AT [facility] [address] [place] [address] Phone: [phone] Fax: [phone]     Social Determinants of Health (SDOH) Interventions    Readmission Risk Interventions No flowsheet data found.  

## 2020-04-24 NOTE — Progress Notes (Signed)
Subjective:   John [name] is a 64 yo male smoker MZ A1AT heterozygote with severe COPD/emphysema on 3L of chronic [place], CAD, HTN, HLD who initially presented to the ED on 7/27 with worsening confusion, increased sleep, decreased appetite, decreased PO intake and weight loss. He was found to be in hypercarbic respiratory failure, placed on Bipap and admitted for a severe COPD exacerbation. CT head showed no acute findings. Patient experienced worsening dyspnea, AMS and failed Bipap so he was subsequently transferred to the ICU on 7/28.   In the ICU, patient was intubated 7/28 for intubation and extubated on 7/31. His ICU course was complicated by steroid induced hyperglycemia, hypokalemia, thrombocytopenia, hypotension 2/2 sedation and hypovolemia and witnessed seizure-like movements x2. EEG showed no seizure activity. Patient was wean off sedation and transferred to the floor and satting well at his home [facility].   During the encounter, patent states he feels goo and is ready to go home. Girlfriend and daughter agrees that patient looks and talks like himself again. Patient has been able to tolerate multiple walks in the the hallway with PT while on his 3LNC. He endorse LE and UE swelling that he states has improved since getting up to walk.   Objective:  Vital signs in last 24 hours: Vitals:   04/24/20 0040 04/24/20 0750 04/24/20 0800 04/24/20 1636  BP: (!) 129/69  (!) 129/79 125/70  Pulse: 92  99 (!) 108  Resp: 17  20   Temp: 98 F (36.7 C)  98.3 F (36.8 C) 97.9 F (36.6 C)  TempSrc: Oral  Oral   SpO2: 90% 98% 98%   Weight: 73.1 kg     Height:       General: Pleasant middle aged man sitting on side of bed. Chronically ill, but no acute distress.  Cardiac: RRR. No murmurs, rubs, gallops.  Chest: On home 3LNC. CTAB. Distant but comfortable w/ no increased WOB. No wheezing or crackles. Abdomen: Non-tender, non-distended. Positive bowel sounds Extremities: Palpable pulses. 2+ lower  extremity edema. Mild edema on both elbows. Skin: No rash Neuro: A&O x3. Moves all extremities. Sensation intact.   Assessment/Plan:  Principal Problem:   Acute respiratory failure with hypercapnia (HCC) Active Problems:   Cigarette smoker   HYPERTENSION, BENIGN   Stented coronary artery   COPD MZ/ GOLD II spirometry  but 02 dep/ hypercarbic as of 11/2019   Protein-calorie malnutrition, severe  Acute on chronic hypoxic/hypercapnic respiratory failure 2/2 COPD exacerbation. Secondary to COPD exacerbation and CO2 retentions with concomitant left-sided community acquired pneumonia. Now S/p intubation (7/28) and extubation (7/31). Satting well on 3LNC. On prednisone and doxy.   --Continue DuoNeb, Pulmicort as needed --Continue Nicotine patch --On Prednisone 20 mg daily, plan to wean slowly --Completed course of Doxycycline (day 7 of 7) --Negative trach aspirate culture  Acute metabolic encephalopathy.  Resolved Likely secondary to CO2 retention. AMS now resolved with patient back to baseline mentation per family. Seizure-like activity on 7/29 likely due to patient coming of sedation. EEG negative for seizure. No signs of withdraw. Per family, patient stopped drinking in August 2020. --Started on Keppra. Plan to wean with 250/500 mg for 3 days, then to 50 mg twice daily for 3 days, then stop. --No seizure-like activity. Consider CIWA coverage if he shows signs of withdrawal.  Fluid retention Likely due to hypervolemia while intubated and steroids use. No history of HF (EF 65-70%) but patient was on lasix at home for LE swelling.  --2+ pitting edema to the  knee bilaterally.  --Consider diuresing before discharge if no improvements.   Steroid induced hyperglycemia. No hx of diabetes. A1C of 5.1. Blood sugar range of 62-176.  -Continue sliding-scale insulin  Hx of CAD, HLD. -Continue Lipitor, aspirin  Hypotension Secondary to sedation and hypovolemia. Resolved with  IVF  Abdominal distention, Improved Likely an element of ileus. Constipation, no BM --Continue Dulcolax  Thrombocytopenia. -Following CBC  Prior to Admission Living Arrangement: Living with family Anticipated Discharge Location: Home Barriers to Discharge:  Dispo: Anticipated discharge in approximately 1-2 day(s).   Steffanie Rainwater, MD 04/24/2020, 5:09 PM Pager: [phone] Internal Medicine Teaching Service After 5pm on weekdays and 1pm on weekends: On Call pager [phone]

## 2020-04-24 NOTE — Progress Notes (Signed)
Inpatient Diabetes Program Recommendations  AACE/ADA: New Consensus Statement on Inpatient Glycemic Control (2015)  Target Ranges:  Prepandial:   less than 140 mg/dL      Peak postprandial:   less than 180 mg/dL (1-2 hours)      Critically ill patients:  140 - 180 mg/dL   Results for John [name], John [name] (MRN [id number]) as of 04/24/2020 07:55  Ref. Range 04/23/2020 00:22 04/23/2020 01:13 04/23/2020 05:16 04/23/2020 06:39 04/23/2020 07:48 04/23/2020 11:42 04/23/2020 15:52 04/23/2020 22:16 04/23/2020 22:18 04/23/2020 22:31  Glucose-Capillary Latest Ref Range: 70 - 99 mg/dL 62 (L) 84 80 73 70 70 220 (H)  1 unit NOVOLOG  58 (L) 57 (L) 86   Results for John [name], John [name] (MRN [id number]) as of 04/24/2020 07:55  Ref. Range 04/24/2020 07:46  Glucose-Capillary Latest Ref Range: 70 - 99 mg/dL 74     Current Insulin Orders: Novolog Sensitive Correction Scale/ SSI (0-9 units) TID AC + HS    MD- Note patient having some issues with mild Hypoglycemia.  No History of Diabetes and Steroids have been significantly reduced.  Please consider stopping the Novolog SSI for now.    Please continue CBG checks tid ac + hs     --Will follow patient during hospitalization--  Ambrose Finland RN, MSN, CDE Diabetes Coordinator Inpatient Glycemic Control Team Team Pager: [phone] (8a-5p)

## 2020-04-24 NOTE — Progress Notes (Signed)
Occupational Therapy Treatment Patient Details Name: John [name] MRN: [id number] DOB: [birth year] Today's Date: 04/24/2020    History of present illness Pt adm with acute on chronic respiratory failure due to copd exacerbation. Pt also with AMS. Pt intubated 7/28 and extubated 7/31. Pt with seizure x 2 on 7/29. PMH - copd, cad, htn   OT comments  Pt progressing with OT goals with focus on progressing activity tolerance during ADLs and educating on energy conservation strategies to implement at home. Pt demo ability to complete short distance mobility with RW at min guard, beginning with 4 L O2 then trialed on baseline 3 L O2 with SpO2 sustained at 90%. HR up to 131bpm during activity. Recommend pt to use walker and shower seat at home to decrease fall risk and maximize independence. Family present during session and report they may have access to this DME for pt to use.    Follow Up Recommendations  Home health OT;Supervision/Assistance - 24 hour    Equipment Recommendations  3 in 1 bedside commode;Other (comment) (Rollator)    Recommendations for Other Services      Precautions / Restrictions Precautions Precautions: Fall Precaution Comments: watch O2 and HR Restrictions Weight Bearing Restrictions: No       Mobility Bed Mobility               General bed mobility comments: sitting EOB on entry and exit  Transfers Overall transfer level: Needs assistance Equipment used: Rolling walker (2 wheeled) Transfers: Sit to/from [employer] Sit to Stand: Supervision Stand pivot transfers: Min guard       General transfer comment: Supervision for sit to stand, cues for hand placement to decrease fall risk. Pt min guard for stand pivot to ensure stability with turning    Balance Overall balance assessment: Needs assistance Sitting-balance support: No upper extremity supported;Feet supported Sitting balance-Leahy Scale: Good     Standing balance  support: Bilateral upper extremity supported Standing balance-Leahy Scale: Poor Standing balance comment: reliant on BUE support                           ADL either performed or assessed with clinical judgement   ADL Overall ADL's : Needs assistance/impaired     Grooming: Supervision/safety;Standing;Brushing hair Grooming Details (indicate cue type and reason): Supervision standing to brush hair at sink, no LOB                 Toilet Transfer: Min guard;Ambulation;RW Toilet Transfer Details (indicate cue type and reason): simulated in room         Functional mobility during ADLs: Min guard;Rolling walker General ADL Comments: Pt with decreased activity tolerance, decreased strength to perform daily tasks. Educated on energy conservation strategies with handout provided     Vision       Perception     Praxis      Cognition Arousal/Alertness: Awake/alert Behavior During Therapy: WFL for tasks assessed/performed Overall Cognitive Status: Within Functional Limits for tasks assessed                                          Exercises     Shoulder Instructions       General Comments Pt received on 4 L O2 (reports wearing 3 L O2 baseline). Pt at 88-89% at rest on 3 L O2, trialed mobiliy  on 4 L O2 with pt sustained at 90%, trialed mobility again on 3 L O2 with 90% sustained. HR up to 131bpm during activity. Mild tremulous movements noted. Pt reports B LE tightness/stiffness decreased after mobility     Pertinent Vitals/ Pain       Pain Assessment: No/denies pain Faces Pain Scale: No hurt  Home Living                                          Prior Functioning/Environment              Frequency  Min 2X/week        Progress Toward Goals  OT Goals(current goals can now be found in the care plan section)  Progress towards OT goals: Progressing toward goals  Acute Rehab OT Goals Patient Stated Goal: to get  stronger and go home OT Goal Formulation: With patient Time For Goal Achievement: 05/07/20 Potential to Achieve Goals: Good ADL Goals Pt Will Perform Grooming: with modified independence;standing;sitting Pt Will Perform Lower Body Dressing: with modified independence;sit to/from stand;with adaptive equipment Pt Will Transfer to Toilet: with modified independence;ambulating Additional ADL Goal #1: Pt will demonstrate independence with use of 3 energy conservation strategies during ADL/IADL and functional mobility completion.  Plan Discharge plan remains appropriate    Co-evaluation                 AM-PAC OT "6 Clicks" Daily Activity     Outcome Measure   Help from another person eating meals?: None Help from another person taking care of personal grooming?: A Little Help from another person toileting, which includes using toliet, bedpan, or urinal?: A Little Help from another person bathing (including washing, rinsing, drying)?: A Little Help from another person to put on and taking off regular upper body clothing?: A Little Help from another person to put on and taking off regular lower body clothing?: A Little 6 Click Score: 19    End of Session Equipment Utilized During Treatment: Gait belt;Rolling walker;Oxygen  OT Visit Diagnosis: Unsteadiness on feet (R26.81);Other abnormalities of gait and mobility (R26.89);Muscle weakness (generalized) (M62.81)   Activity Tolerance Patient tolerated treatment well   Patient Left in bed;with call bell/phone within reach;with family/visitor present   Nurse Communication Mobility status        Time: 8469-6295 OT Time Calculation (min): 37 min  Charges: OT General Charges $OT Visit: 1 Visit OT Treatments $Self Care/Home Management : 8-22 mins $Therapeutic Activity: 8-22 mins  Lorre Munroe, OTR/L   Lorre Munroe 04/24/2020, 2:16 PM

## 2020-04-24 NOTE — Progress Notes (Signed)
Report given to 2W nurse Karren Burly RN. All belongings and meds sent with pt.

## 2020-04-25 LAB — GLUCOSE, CAPILLARY
Glucose-Capillary: 80 mg/dL (ref 70–99)
Glucose-Capillary: 80 mg/dL (ref 70–99)
Glucose-Capillary: 185 mg/dL — ABNORMAL HIGH (ref 70–99)

## 2020-04-25 MED ORDER — LEVETIRACETAM 250 MG PO TABS
ORAL_TABLET | ORAL | 0 refills | Status: DC
Start: 2020-04-25 — End: 2020-04-25

## 2020-04-25 MED ORDER — LEVETIRACETAM 250 MG PO TABS
ORAL_TABLET | ORAL | 0 refills | Status: DC
Start: 2020-04-25 — End: 2020-05-17

## 2020-04-25 MED ORDER — PREDNISONE 10 MG PO TABS: ORAL_TABLET | ORAL | 0 refills | Status: DC

## 2020-04-25 MED ORDER — POTASSIUM CHLORIDE ER 10 MEQ PO TBCR: 10.0000 meq | EXTENDED_RELEASE_TABLET | Freq: Every day | ORAL | 0 refills | Status: DC

## 2020-04-25 NOTE — Discharge Summary (Signed)
Name: John [name] MRN: [id number] DOB: 07-05-1956 64 y.o. PCP: Nyoka Cowden, MD  Date of Admission: 04/18/2020 12:16 PM Date of Discharge:  Attending Physician: Miguel Aschoff, MD  Discharge Diagnosis: 1.  Acute respiratory failure with hypercapnia secondary to COPD exacerbation  Discharge Medications: Allergies as of 04/25/2020      Reactions   Penicillins Other (See Comments)   Passes out      Medication List    TAKE these medications   albuterol 108 (90 Base) MCG/ACT inhaler Commonly known as: ProAir HFA 2 puffs every 4 hours as needed only  if your can't catch your breath   aspirin EC 81 MG tablet Take 81 mg by mouth daily.   atorvastatin 40 MG tablet Commonly known as: LIPITOR Take 1 tablet (40 mg total) by mouth daily.   Breztri Aerosphere 160-9-4.8 MCG/ACT Aero Generic drug: Budeson-Glycopyrrol-Formoterol Inhale 2 puffs into the lungs in the morning and at bedtime.   furosemide 40 MG tablet Commonly known as: LASIX Take 1 tablet (40 mg total) by mouth 2 (two) times daily. What changed: when to take this   levETIRAcetam 250 MG tablet Commonly known as: Keppra On 8/4, take 250 mg by mouth in the  Morning. From 8/5 to 8/8, take 250 mg by mouth in the morning and evening. From 8/9 to 8/11, take 250 mg by mouth in the morning. Then stop.   multivitamin with minerals Tabs tablet Take 1 tablet by mouth daily.   nitroGLYCERIN 0.4 MG SL tablet Commonly known as: Nitrostat Place 1 tablet (0.4 mg total) under the tongue every 5 (five) minutes as needed for chest pain.   potassium chloride 10 MEQ tablet Commonly known as: KLOR-CON Take 1 tablet (10 mEq total) by mouth daily for 28 days.   predniSONE 10 MG tablet Commonly known as: DELTASONE Take prednisone 15 mg (1.5 of the 10 mg tablets) each for 3 days. Take prednisone 10 mg each day for 3 days. Take prednisone 5 mg each day for 3 days. Then stop.       Disposition and follow-up:     John [name] was discharged from [facility] in Stable condition.  At the hospital follow up visit please address:  1.  Respiratory status. Patient was able to go home on [facility], which is what he baseline at home.  2. Lower Extremity Edema: Patient had 2+ LE pitting edema to the knees bilaterally. Assess adherence to lasix therapy and any improvements.  3. At cardiology appt, obtain a BMP and assess whether he need to be on oral potassium. Adjust lasix dose as necessary to manage edema.   2.  Labs / imaging needed at time of follow-up: CBC, BMP  3.  Pending labs/ test needing follow-up: None  Follow-up Appointments: 05/03/2020 [facility] with Elisha Headland, NP  05/17/2020 Cardiology appt with Dr. Freddie Breech Course by problem list: 1. Acute on chronic hypoxic/hypercapnic respiratory failure 2/2 COPD exacerbation. Patient presented to the hospital in respiratory distress secondary to COPD exacerbation. CXR showed concomitant left-sided community acquired pneumonia. Patient failed BiPAP due to his altered mental status he was intubated and admitted to the ICU on 7/28. He had an ECHO the same day that showed an EF of 65-70%. On 7/29, a CT head did not show any acute findings. His respiratory status continued to improve and he was extubated on 7/31. He was transfered to the floor until discharge. Patient was able to tolerate ambulating  while on [facility], which is his baseline at home. He was discharged home with an appointment for a hospital follow up.   Acute metabolic encephalopathy.  Resolved Patient presented to the hospital with altered mental status likely secondary to CO2 retention. During his ICU stay on 7/29, he sustained a seizure-like activity while coming of sedation. EEG did not show any seizure activity. Neuro was consulted and patient was started on Keppra with plan to wean off. At discharge, patient's mental status was back to his  baseline. He was discharge home on Keppra with instructions to wean off over 1 week.   Fluid retention Patient was euvolumic on admission. During his ICU stay, patient became hypervolemic because of increase IV fluids and steroid use after intubation. No history of HF (EF 65-70%) but patient was on lasix 40 mg daily at home for LE swelling. He had 2+ pitting edema to the knee bilaterally at discharge. We was intructed to start taking his lasix 40 mg BID at home. Reassess volume status and adjust lasix dose as necessary.   Steroid induced hyperglycemia. Patient has no history of diabetes. His A1C is 5.1. Patient started having elevated blood sugars after he was started on prednisone to treat the inflammation in his lungs. Blood sugar range of 62-176. This was treated with sliding-scale insulin.   Hx of CAD, HLD. We continued his lipitor and aspirin  Hypotension Patient became hypotensive while in the ICU secondary to sedation and hypovolemia. His blood pressure was improved with IV fluid resuscitation. His blood pressure was stable on discharge.   Thrombocytopenia. Patient was admitted with a hemoglobin of 143. Patient's hemoglobin continued to dropped during admission. Hemoglobin at discharge was 112. Check CBC at follow up visit  Discharge Vitals:   BP 128/81 (BP Location: Left Arm)   Pulse 93   Temp 98.1 F (36.7 C) (Oral)   Resp 18   Ht 5\' 11"  (1.803 m)   Wt 73.1 kg   SpO2 94%   BMI 22.48 kg/m   Pertinent Labs, Studies, and Procedures:  CBC Latest Ref Rng & Units 04/23/2020 04/22/2020 04/21/2020  WBC 4.0 - 10.5 K/uL 10.8(H) 6.8 -  Hemoglobin 13.0 - 17.0 g/dL 12.0(L) 11.8(L) 11.9(L)  Hematocrit 39 - 52 % 37.5(L) 36.6(L) 35.0(L)  Platelets 150 - 400 K/uL 112(L) 87(L) -    Discharge Instructions: John [name], it was a pleasure to take care of you during your stay at the hospital. We are discharging you home in stable condition.   We have scheduled a hospital follow up for  you next week so make sure to go to this appointment. Also, keep your appointment with your heart doctor.   We want you to continue to use your oxygen at home. Please take your prednisone, keppra and potassium pills as instructed.   Signed: Truitt Leep, MD 04/25/2020, 6:42 AM   Pager: [phone] Internal Medicine Teaching Service

## 2020-04-25 NOTE — Plan of Care (Signed)
?  Problem: Education: ?Goal: Knowledge of General Education information will improve ?Description: Including pain rating scale, medication(s)/side effects and non-pharmacologic comfort measures ?Outcome: Adequate for Discharge ?  ?Problem: Health Behavior/Discharge Planning: ?Goal: Ability to manage health-related needs will improve ?Outcome: Adequate for Discharge ?  ?Problem: Clinical Measurements: ?Goal: Ability to maintain clinical measurements within normal limits will improve ?Outcome: Adequate for Discharge ?Goal: Will remain free from infection ?Outcome: Adequate for Discharge ?Goal: Diagnostic test results will improve ?Outcome: Adequate for Discharge ?Goal: Respiratory complications will improve ?Outcome: Adequate for Discharge ?Goal: Cardiovascular complication will be avoided ?Outcome: Adequate for Discharge ?  ?Problem: Activity: ?Goal: Risk for activity intolerance will decrease ?Outcome: Adequate for Discharge ?  ?Problem: Nutrition: ?Goal: Adequate nutrition will be maintained ?Outcome: Adequate for Discharge ?  ?Problem: Coping: ?Goal: Level of anxiety will decrease ?Outcome: Adequate for Discharge ?  ?Problem: Elimination: ?Goal: Will not experience complications related to bowel motility ?Outcome: Adequate for Discharge ?Goal: Will not experience complications related to urinary retention ?Outcome: Adequate for Discharge ?  ?Problem: Pain Managment: ?Goal: General experience of comfort will improve ?Outcome: Adequate for Discharge ?  ?Problem: Safety: ?Goal: Ability to remain free from injury will improve ?Outcome: Adequate for Discharge ?  ?Problem: Skin Integrity: ?Goal: Risk for impaired skin integrity will decrease ?Outcome: Adequate for Discharge ?  ?Problem: Activity: ?Goal: Ability to tolerate increased activity will improve ?Outcome: Adequate for Discharge ?  ?Problem: Respiratory: ?Goal: Ability to maintain a clear airway and adequate ventilation will improve ?Outcome: Adequate for  Discharge ?  ?Problem: Role Relationship: ?Goal: Method of communication will improve ?Outcome: Adequate for Discharge ?  ?

## 2020-04-25 NOTE — Progress Notes (Signed)
° °  Subjective:   No acute events overnight. Patient has been maintaining O2 sats in the low 90s while ambulating. No complains today. Ready for to go home. States he wants to see Dr. Craige Cotta in the outpatient for management his COPD. Plans to see his cardiology in 3 weeks.   Objective:  Vital signs in last 24 hours: Vitals:   04/24/20 2028 04/24/20 2046 04/25/20 0721 04/25/20 0741  BP:  128/81 (!) 144/68   Pulse:  93 (!) 109   Resp:  18 16   Temp:  98.1 F (36.7 C) 98.1 F (36.7 C)   TempSrc:  Oral    SpO2: 96% 94% 97% 97%  Weight:      Height:       General: Pleasant middle aged. No acute distress. Dressed and ready for discharge Cardiac: RRR. No murmurs, rubs, gallops.  Chest: On home 3LNC. CTAB. Distant but comfortable w/ no increased WOB. No wheezing or crackles. Abdomen: Non-tender, non-distended. Positive bowel sounds Extremities: Palpable pulses. 2+ lower extremity edema. Skin: No rash Neuro: A&O x3. Moves all extremities. Sensation intact.   Assessment/Plan:  Principal Problem:   Acute respiratory failure with hypercapnia (HCC) Active Problems:   Cigarette smoker   HYPERTENSION, BENIGN   Stented coronary artery   COPD MZ/ GOLD II spirometry  but 02 dep/ hypercarbic as of 11/2019   Protein-calorie malnutrition, severe  Acute on chronic hypoxic/hypercapnic respiratory failure 2/2 COPD exacerbation. Secondary to COPD exacerbation and CO2 retentions with concomitant left-sided community acquired pneumonia. Now S/p intubation (7/28) and extubation (7/31). Satting well on 3LNC. Patient back to baseline and ready for discharge. --On Prednisone 20 mg daily, plan to wean at home --Follow up with Pulmonary --Follow up with cardiology  Acute metabolic encephalopathy.  Resolved Likely secondary to CO2 retention. AMS now resolved with patient back to baseline mentation per family. Seizure-like activity on 7/29 likely due to patient coming of sedation. EEG negative for seizure.  No signs of withdraw. Per family, patient stopped drinking in August 2020. No seizure-like activity today --Keppra wean at home  Fluid retention Likely due to hypervolemia while intubated and steroids use. No history of HF (EF 65-70%) but patient was on lasix at home for LE swelling.  --2+ pitting edema to the knee bilaterally.  --Continue lasix 40 mg BID at home  Steroid induced hyperglycemia. No hx of diabetes. A1C of 5.1. Blood sugar range of 62-176.  -Continue sliding-scale insulin  Hx of CAD, HLD. -Continue Lipitor, aspirin  Hypotension Secondary to sedation and hypovolemia. Resolved with IVF  Thrombocytopenia. -Following CBC  Prior to Admission Living Arrangement: Living with family Anticipated Discharge Location: Home Barriers to Discharge: None Dispo: Anticipated discharge today  Steffanie Rainwater, MD 04/25/2020, 5:43 PM Pager: [phone] Internal Medicine Teaching Service After 5pm on weekdays and 1pm on weekends: On Call pager [phone]

## 2020-04-25 NOTE — TOC Transition Note (Signed)
Transition of Care [facility]) - CM/SW Discharge Note   Patient Details  Name: [name] MRN: [id number] Date of Birth: Dec 03, 1955  Transition of Care [facility]) CM/SW Contact:  Lorri Frederick, LCSW Phone Number: 04/25/2020, 2:01 PM   Clinical Narrative:   CSW spoke with Clydie Braun at [facility] regarding charity care for this pt.  Clydie Braun spoke with pt and reports that pt has too much credit to qualify for their charity care services.  CSW spoke with pt who reports that he does not want to pay out of pocket for [facility] or set up outpt PT at this time.  No equipment needs as pt already has walker, bedside commode, and shower chair at home.  PCP in place.  Daughter to transport home.  No further needs.    Final next level of care: Home/Self Care Barriers to Discharge: Barriers Resolved   Patient Goals and CMS Choice Patient states their goals for this hospitalization and ongoing recovery are:: get moving more CMS Medicare.gov Compare Post Acute Care list provided to:: Patient Choice offered to / list presented to : Patient  Discharge Placement                       Discharge Plan and Services     Post Acute Care Choice: Home Health                               Social Determinants of Health (SDOH) Interventions     Readmission Risk Interventions No flowsheet data found.

## 2020-05-03 ENCOUNTER — Telehealth: Payer: Self-pay | Admitting: Pulmonary Disease

## 2020-05-03 ENCOUNTER — Other Ambulatory Visit: Payer: Self-pay

## 2020-05-03 ENCOUNTER — Ambulatory Visit (INDEPENDENT_AMBULATORY_CARE_PROVIDER_SITE_OTHER): Payer: Self-pay | Admitting: Pulmonary Disease

## 2020-05-03 ENCOUNTER — Encounter: Payer: Self-pay | Admitting: Pulmonary Disease

## 2020-05-03 ENCOUNTER — Telehealth: Payer: Self-pay

## 2020-05-03 VITALS — BP 98/58 | HR 108 | Temp 97.5°F | Ht 71.0 in | Wt 142.8 lb

## 2020-05-03 DIAGNOSIS — Z598 Other problems related to housing and economic circumstances: Secondary | ICD-10-CM

## 2020-05-03 DIAGNOSIS — Z7189 Other specified counseling: Secondary | ICD-10-CM

## 2020-05-03 DIAGNOSIS — J449 Chronic obstructive pulmonary disease, unspecified: Secondary | ICD-10-CM

## 2020-05-03 DIAGNOSIS — J9612 Chronic respiratory failure with hypercapnia: Secondary | ICD-10-CM

## 2020-05-03 DIAGNOSIS — Z5989 Other problems related to housing and economic circumstances: Secondary | ICD-10-CM

## 2020-05-03 DIAGNOSIS — J9611 Chronic respiratory failure with hypoxia: Secondary | ICD-10-CM

## 2020-05-03 LAB — BASIC METABOLIC PANEL
BUN: 16 mg/dL (ref 6–23)
CO2: 40 mEq/L — ABNORMAL HIGH (ref 19–32)
Calcium: 9.4 mg/dL (ref 8.4–10.5)
Chloride: 94 mEq/L — ABNORMAL LOW (ref 96–112)
Creatinine, Ser: 0.82 mg/dL (ref 0.40–1.50)
GFR: 94.98 mL/min (ref >60.00)
Glucose, Bld: 108 mg/dL — ABNORMAL HIGH (ref 70–99)
Potassium: 5.2 mEq/L — ABNORMAL HIGH (ref 3.5–5.1)
Sodium: 139 mEq/L (ref 135–145)

## 2020-05-03 MED ORDER — BREZTRI AEROSPHERE 160-9-4.8 MCG/ACT IN AERO
2.0000 | INHALATION_SPRAY | Freq: Two times a day (BID) | RESPIRATORY_TRACT | 0 refills | Status: DC
Start: 2020-05-03 — End: 2020-05-17

## 2020-05-03 NOTE — Telephone Encounter (Signed)
This CM spoke to John Headland, NP regarding patients needs.    This CM also spoke to the patient and informed him that a referral can be made to the [facility] case manager in [place] to discuss possible resources to help him receive the care that he needs to manage his health. He was in agreement to placing the referral   He needs to establish care with a PCP in his area.  He said that he drives and did not mention an issue with transportation.  The patient did state that he receives about $2100/month disability.  There is a Artist in [place] who may be able to provide some guidance for him regarding his insurance questions   Message sent to [employer] in [place].   Message left to Earney Mallet, EMT- [facility] Need to discuss if patient would be eligible for supportive services in the community.

## 2020-05-03 NOTE — Patient Instructions (Signed)
You were seen today by Coral Ceo, NP  for:   1. COPD MZ/ GOLD II spirometry  but 02 dep/ hypercarbic as of 11/2019  Breztri >>> 2 puffs in the morning right when you wake up, rinse out your mouth after use, 12 hours later 2 puffs, rinse after use >>> Take this daily, no matter what >>> This is not a rescue inhaler   Continue oxygen therapy  We will work to get you established with a primary care doctor, I have placed a referral for this  We will work to get you established with Dr. Craige Cotta  2. Chronic respiratory failure with hypoxia and hypercapnia (HCC)  Continue oxygen therapy as prescribed  >>>maintain oxygen saturations greater than 88 percent  >>>if unable to maintain oxygen saturations please contact the office  >>>do not smoke with oxygen  >>>can use nasal saline gel or nasal saline rinses to moisturize nose if oxygen causes dryness   3. Does not have health insurance 4. Complex care coordination  We will see what outpatient resources are available to help you with coordinating your care given the fact that you do not have insurance and you are currently paying cash pay and you are unable to apply for Medicaid until October/2021   We recommend today:  Orders Placed This Encounter  Procedures  . Ambulatory referral to Internal Medicine    Referral Priority:   Urgent    Referral Type:   Consultation    Referral Reason:   Specialty Services Required    Requested Specialty:   Internal Medicine    Number of Visits Requested:   1   Orders Placed This Encounter  Procedures  . Ambulatory referral to Internal Medicine   Meds ordered this encounter  Medications  . Budeson-Glycopyrrol-Formoterol (BREZTRI AEROSPHERE) 160-9-4.8 MCG/ACT AERO    Sig: Inhale 2 puffs into the lungs 2 (two) times daily.    Dispense:  10.7 g    Refill:  0    Order Specific Question:   Lot Number?    Answer:   [id number]    Order Specific Question:   Expiration Date?    Answer:   05/24/2021     Order Specific Question:   Manufacturer?    Answer:   [employer] [[id number]]    Order Specific Question:   Quantity    Answer:   4    Follow Up:    Return in about 6 weeks (around 06/14/2020) for Follow up with Dr. Craige Cotta, 30 MINUTE SLOT.  Patient needs 6-week follow-up with Dr. Craige Cotta in a 30-minute time slot or an APP.  Patient is wishing to switch Pulmonary care to Dr. Craige Cotta. Can be in [place] or [place] office. If no availability within that timeframe please schedule with APP with chest x-ray.  Patient also will then need appt/or recall with Dr. Craige Cotta in 8 to 12 weeks in either the [facility] or [place] clinic 30-minute time slot.      Please do your part to reduce the spread of COVID-19:      Reduce your risk of any infection  and COVID19 by using the similar precautions used for avoiding the common cold or flu:  Marland Kitchen Wash your hands often with soap and warm water for at least 20 seconds.  If soap and water are not readily available, use an alcohol-based hand sanitizer with at least 60% alcohol.  . If coughing or sneezing, cover your mouth and nose by coughing or sneezing into the elbow areas of  your shirt or coat, into a tissue or into your sleeve (not your hands). Drinda Butts A MASK when in public  . Avoid shaking hands with others and consider head nods or verbal greetings only. . Avoid touching your eyes, nose, or mouth with unwashed hands.  . Avoid close contact with people who are sick. . Avoid places or events with large numbers of people in one location, like concerts or sporting events. . If you have some symptoms but not all symptoms, continue to monitor at home and seek medical attention if your symptoms worsen. . If you are having a medical emergency, call 911.   ADDITIONAL HEALTHCARE OPTIONS FOR PATIENTS  [facility] / e-Visit: [email or web address]         [facility]: [phone]  [facility]:  [phone]                   [facility]: [phone]     It is flu season:   >>> Best ways to protect herself from the flu: Receive the yearly flu vaccine, practice good hand hygiene washing with soap and also using hand sanitizer when available, eat a nutritious meals, get adequate rest, hydrate appropriately   Please contact the office if your symptoms worsen or you have concerns that you are not improving.   Thank you for choosing [facility] for your healthcare, and for allowing [place] to partner with you on your healthcare journey. I am thankful to be able to provide care to you today.   Elisha Headland FNP-C

## 2020-05-03 NOTE — Progress Notes (Signed)
Reviewed and agree with assessment/plan.   Coralyn Helling, MD [facility] Pulmonary/Critical Care 05/03/2020, 3:04 PM Pager:  [phone]

## 2020-05-03 NOTE — Assessment & Plan Note (Signed)
04/18/2020-ABG-pH 7.3, PCO2 104, PO2 104, bicarb 50.6  12/09/2019-pulmonary function tests FVC 1.67 (34% predicted), postbronchodilator ratio 37, postbronchodilator FEV1 0.59 (16% predicted), no bronchodilator response, DLCO 7.65 (27% predicted)  Patient may qualify for noninvasive ventilator at home.  He likely would benefit given the severity of his COPD as well as previous ABG showing hypercarbia. Current barrier is his lack of insurance We will work on trying to provide additional resources to help the patient.  Plan: Continue oxygen therapy Bmet today We will have patient establish care with Dr. Craige Cotta and 30-minute time slot

## 2020-05-03 NOTE — Assessment & Plan Note (Signed)
Recent hospitalization Without insurance High risk for readmission Very severe COPD with hypercarbia, oxygen dependent May benefit from noninvasive ventilator

## 2020-05-03 NOTE — Assessment & Plan Note (Signed)
Plan: Have attempted to access additional resources within the [facility] system Have requested additional support from [name]

## 2020-05-03 NOTE — Assessment & Plan Note (Signed)
Plan:  Continue Breztri  Samples provided today Referral to community health and wellness this patient has no insurance Contacted [facility] to help with coordinating care for the patient We will have patient establish care with Dr. Craige Cotta and 30-minute time slot Continue to not smoke Contacted [name] to see what it external resources they may have in the outpatient setting to help with the patient Continue oxygen therapy at this time

## 2020-05-03 NOTE — Telephone Encounter (Signed)
Spoke with Jacki Cones  She states something is wrong with their machine at [place] and STAT BMET that we ordered may have to be sent out  She states they have someone working on machine now, but if not able to fix in approx 5 min it must be sent out  Will forward to [place] to make him aware of this

## 2020-05-03 NOTE — Progress Notes (Addendum)
@Patient  ID: John [name], male    DOB: 09-03-1956, 64 y.o.   MRN: [id number]  Chief Complaint  Patient presents with  . Follow-up    Hospital follow-up, patient would like to switch pulmonary providers, COPD    Referring provider: Nyoka Cowden, MD  HPI:  64 year old male former smoker followed in our office for COPD, chronic respiratory failure  PMH: Hyperlipidemia, hypertension, CAD, history of EtOH abuse Smoker/ Smoking History: Former smoker.  Quit July/2021.  100-pack-year smoking history. Maintenance:  Breztri Pt of: Dr. Sherene Sires  05/03/2020  - Visit   64 year old male former smoker followed in our office for COPD.  He is established with Dr. Sherene Sires.  Patient presenting today as a hospital follow-up.  Patient was hospitalized on 04/18/2020.  He was discharged from the hospital on 04/25/2020.  An excerpt of that discharge summary is listed below:  1.  Respiratory status. Patient was able to go home on [facility], which is what he baseline at home.  2. Lower Extremity Edema: Patient had 2+ LE pitting edema to the knees bilaterally. Assess adherence to lasix therapy and any improvements.  3. At cardiology appt, obtain a BMP and assess whether he need to be on oral potassium. Adjust lasix dose as necessary to manage edema.   2.  Labs / imaging needed at time of follow-up: CBC, BMP  3.  Pending labs/ test needing follow-up: None  Hospital Course by problem list: 1. Acute on chronic hypoxic/hypercapnic respiratory failure 2/2 COPD exacerbation. Patient presented to the hospital in respiratory distress secondary to COPD exacerbation. CXR showed concomitant left-sided community acquired pneumonia. Patient failed BiPAP due to his altered mental status he was intubated and admitted to the ICU on 7/28. He had an ECHO the same day that showed an EF of 65-70%. On 7/29, a CT head did not show any acute findings. His respiratory status continued to improve and he was extubated on 7/31. He was  transfered to the floor until discharge. Patient was able to tolerate ambulating while on [facility], which is his baseline at home.He was discharged home with an appointment for a hospital follow up.   Acute metabolic encephalopathy.Resolved Patient presented to the hospital with altered mental status likely secondary to CO2 retention. During his ICU stay on 7/29, he sustained a seizure-like activity while coming of sedation. EEG did not show any seizure activity. Neuro was consulted and patient was started on Keppra with plan to wean off. At discharge, patient's mental status was back to his baseline. He was discharge home on Keppra with instructions to wean off over 1 week.   Fluid retention Patient was euvolumic on admission. During his ICU stay, patient became hypervolemic because of increase IV fluids and steroid use after intubation. No history of HF (EF 65-70%) but patient was on lasix 40 mg daily at home for LE swelling. He had 2+ pitting edema to the knee bilaterally at discharge. We was intructed to start taking his lasix 40 mg BID at home. Reassess volume status and adjust lasix dose as necessary.   Steroid induced hyperglycemia. Patient has no history of diabetes. His A1C is 5.1. Patient started having elevated blood sugars after he was started on prednisone to treat the inflammation in his lungs. Blood sugar range of 62-176. This was treated with sliding-scale insulin.   Hx of CAD, HLD. We continued his lipitor and aspirin  Hypotension Patient became hypotensive while in the ICU secondary to sedation and hypovolemia. His blood pressure was  improved with IV fluid resuscitation. His blood pressure was stable on discharge.   Thrombocytopenia. Patient was admitted with a hemoglobin of 143. Patient's hemoglobin continued to dropped during admission. Hemoglobin at discharge was 112. Check CBC at follow up visit  Patient reports that he is upcoming follow-up with cardiology in 2 weeks.   He does not have a primary care provider.  He is unsure how to get 1.  He does not have any insurance.  He is currently on disability.  He reports that he cannot apply for Medicaid.  He reports that he was seen by social work while he was hospitalized today so that he does not qualify for "anything".  Patient is currently paying for oxygen out-of-pocket from [employer].  On arrival to the hospital patient's arterial blood gas on 04/18/2020 was as follows:  04/18/2020-ABG-pH 7.3, PCO2 104, PO2 104, bicarb 50.6  Patient was also seen by the [facility] team.  Patient reporting today that he would like to establish with Dr. Craige Cotta not Dr. Sherene Sires.  We will work on discussing and coordinating this today.  Questionaires / Pulmonary Flowsheets:   ACT:  No flowsheet data found.  MMRC: mMRC Dyspnea Scale mMRC Score  05/03/2020 3  11/23/2019 3    Epworth:  No flowsheet data found.  Tests:   04/23/2020-chest x-ray-improving pneumonia in medial left lung base, likely in the lingula, although mild pneumonia persists, stable severe COPD/emphysema  12/09/2019-pulmonary function tests FVC 1.67 (34% predicted), postbronchodilator ratio 37, postbronchodilator FEV1 0.59 (16% predicted), no bronchodilator response, DLCO 7.65 (27% predicted)  04/19/2020-echocardiogram-LV ejection fraction 65 to 70%, right ventricular systolic function is normal, normal pulmonary artery systolic pressure  FENO:  No results found for: NITRICOXIDE  PFT: PFT Results Latest Ref Rng & Units 12/09/2019  FVC-Pre L 1.67  FVC-Predicted Pre % 34  FVC-Post L 1.62  FVC-Predicted Post % 33  Pre FEV1/FVC % % 35  Post FEV1/FCV % % 37  FEV1-Pre L 0.58  FEV1-Predicted Pre % 15  FEV1-Post L 0.59  DLCO uncorrected ml/min/mmHg 7.65  DLCO UNC% % 27  DLCO corrected ml/min/mmHg 7.13  DLCO COR %Predicted % 25  DLVA Predicted % 31    WALK:  SIX MIN WALK 11/23/2019 12/25/2018  Supplimental Oxygen during Test? (L/min) Yes No  O2 Flow Rate 2 -  Type  Continuous -  Tech Comments: Patient walked moderate pace and maintained good O2 sats while on 2L patient walked fast past, on 2nd lap had to rest. Pt refused O2 during walk, was SOB during the last two laps.kmw    Imaging: DG Chest 2 View  Result Date: 04/18/2020 CLINICAL DATA:  Shortness of breath for 2 weeks EXAM: CHEST - 2 VIEW COMPARISON:  11/23/2019, 03/18/2018 FINDINGS: The heart size and mediastinal contours are within normal limits. Aortic atherosclerosis. Coronary artery stents. Hyperexpanded lungs with chronically coarsened interstitial markings. Bibasilar scarring. No new focal airspace consolidation, pleural effusion, or pneumothorax. The visualized skeletal structures are unremarkable. IMPRESSION: COPD with bibasilar scarring. No new focal airspace consolidation. Electronically Signed   By: Duanne Guess D.O.   On: 04/18/2020 12:44   DG Abd 1 View  Result Date: 04/19/2020 CLINICAL DATA:  NG tube placement. EXAM: ABDOMEN - 1 VIEW COMPARISON:  Earlier today. FINDINGS: The enteric tube has been advanced with tip and side-port now below the diaphragm in the stomach. Remainder the exam is limited by penetration. IMPRESSION: Tip and side port of the enteric tube below the diaphragm in the stomach. Electronically Signed  By: Narda Rutherford M.D.   On: 04/19/2020 20:01   CT HEAD WO CONTRAST  Result Date: 04/20/2020 CLINICAL DATA:  Recent seizure EXAM: CT HEAD WITHOUT CONTRAST TECHNIQUE: Contiguous axial images were obtained from the base of the skull through the vertex without intravenous contrast. COMPARISON:  04/18/2020 FINDINGS: Brain: No evidence of acute infarction, hemorrhage, hydrocephalus, extra-axial collection or mass lesion/mass effect. Vascular: No hyperdense vessel or unexpected calcification. Skull: Normal. Negative for fracture or focal lesion. Sinuses/Orbits: No acute finding. Other: None. IMPRESSION: No acute intracranial abnormality noted. Electronically Signed   By: Alcide Clever M.D.   On: 04/20/2020 00:48   CT Head Wo Contrast  Result Date: 04/18/2020 CLINICAL DATA:  Mental status change EXAM: CT HEAD WITHOUT CONTRAST TECHNIQUE: Contiguous axial images were obtained from the base of the skull through the vertex without intravenous contrast. COMPARISON:  None. FINDINGS: Brain: There is no acute intracranial hemorrhage, mass effect, or edema. Gray-white differentiation is preserved. There is no extra-axial fluid collection. Ventricles and sulci are within normal limits in size and configuration. Vascular: There is mild atherosclerotic calcification at the skull base. Skull: Calvarium is unremarkable. Sinuses/Orbits: No acute finding. Other: None. IMPRESSION: No acute intracranial abnormality. Electronically Signed   By: Guadlupe Spanish M.D.   On: 04/18/2020 21:03   DG Chest Port 1 View  Result Date: 04/23/2020 CLINICAL DATA:  Acute superimposed upon chronic respiratory failure associated with hypoxia. Current history of COPD/emphysema. EXAM: PORTABLE CHEST 1 VIEW COMPARISON:  04/22/2020 and earlier, including CTA chest 11/23/2019. FINDINGS: Cardiac silhouette normal in size, unchanged. Severe emphysematous changes throughout both lungs, unchanged. The focal pneumonia in the medial LEFT lung base, likely in the lingula, has improved since the examination yesterday were it had a nodular appearance. Mild airspace opacities persist. No new pulmonary parenchymal abnormalities. IMPRESSION: 1. Improving pneumonia in the medial LEFT lung base, likely in the lingula, though mild pneumonia persists. 2. Stable severe COPD/emphysema. 3. No new abnormalities. Electronically Signed   By: Hulan Saas M.D.   On: 04/23/2020 09:10   DG Chest Port 1 View  Result Date: 04/22/2020 CLINICAL DATA:  Respiratory failure EXAM: PORTABLE CHEST 1 VIEW COMPARISON:  April 21, 2020 FINDINGS: Endotracheal tube terminates in the proximal trachea approximately 6.5 cm above the carina, tip below clavicular  heads on current image. Gastric tube courses below the LEFT hemidiaphragm. Leads project over the anterior chest. Signs of pulmonary emphysema. LEFT lower lobe density measuring 3.3 x 2.3 cm. Hyperinflation. No signs of effusion. On limited assessment skeletal structures without acute process. IMPRESSION: 1. LEFT infrahilar density more nodular than expected for pneumonia though this could be pneumonia superimposed on emphysema giving an atypical appearance. Underlying lesion is not excluded, consider follow-up chest CT for further evaluation. This is a new finding since March of 2021. 2. Signs of pulmonary emphysema. 3. LEFT lower lobe density measuring 3.3 x 2.3 cm. Suspicious for infection. Electronically Signed   By: Donzetta Kohut M.D.   On: 04/22/2020 10:56   DG Chest Port 1 View  Result Date: 04/21/2020 CLINICAL DATA:  Respiratory failure. EXAM: PORTABLE CHEST 1 VIEW COMPARISON:  04/19/2020. FINDINGS: Endotracheal tube, NG tube in stable position. Heart size normal. Left infrahilar infiltrate noted suggesting pneumonia. Follow-up exams demonstrate clearing suggested. No pleural effusion or pneumothorax. IMPRESSION: 1.  Endotracheal tube and NG tube in stable position. 2.  New left infrahilar infiltrate suggesting pneumonia. Electronically Signed   By: Maisie Fus  Register   On: 04/21/2020 05:28   Portable  Chest x-ray  Addendum Date: 04/19/2020   ADDENDUM REPORT: 04/19/2020 12:37 ADDENDUM: The position of the enteric tube was discussed with Dr. Craige Cotta of critical-care by telephone at 12:36 p.m on 04/19/2020. Electronically Signed   By: Jackey Loge DO   On: 04/19/2020 12:37   Result Date: 04/19/2020 CLINICAL DATA:  ET tube placement. EXAM: PORTABLE CHEST 1 VIEW COMPARISON:  Prior chest radiograph 04/18/2020 and earlier FINDINGS: ET tube in satisfactory position, terminating midway between the clavicular heads and carina. An enteric tube is coiled within the distal esophagus. Repositioning is recommended.  Heart size within normal limits. Cardiac stents. No appreciable airspace consolidation or pulmonary edema. Hyperexpanded lungs consistent with COPD. No definite pleural effusion. No evidence of pneumothorax. No acute bony abnormality identified. IMPRESSION: The enteric tube is coiled within the distal esophagus. Repositioning is recommended prior to use. ET tube in satisfactory position. Findings consistent with COPD. No appreciable airspace consolidation. Electronically Signed: By: Jackey Loge DO On: 04/19/2020 12:21   DG Abd Portable 1V  Result Date: 04/19/2020 CLINICAL DATA:  Replaced OG tube. EXAM: PORTABLE ABDOMEN - 1 VIEW COMPARISON:  None. FINDINGS: Tip of the enteric tube is in the distal esophagus, the side port is in the mid lower esophagus. Recommend advancement of greater than 10 cm to place the side-port below the diaphragm. Moderate stool in the included colon. IMPRESSION: Tip of the enteric tube in the distal esophagus, the side-port in the mid lower esophagus. Recommend advancement of greater than 10 cm to place the side-port below the diaphragm. Electronically Signed   By: Narda Rutherford M.D.   On: 04/19/2020 17:56   EEG adult  Result Date: 04/19/2020 Charlsie Quest, MD     04/20/2020 12:43 PM Patient Name: MARON STANZIONE MRN: [id number] Epilepsy Attending: Charlsie Quest Referring Physician/Provider: Dr. Tonye Royalty Date: 04/19/2020 Duration: 22.32 mins Patient history: 64 year old male with altered mental status.  EEG 12 for seizures. Level of alertness: lethargic AEDs during EEG study: None Technical aspects: This EEG study was done with scalp electrodes positioned according to the 10-20 International system of electrode placement. Electrical activity was acquired at a sampling rate of 500Hz  and reviewed with a high frequency filter of 70Hz  and a low frequency filter of 1Hz . EEG data were recorded continuously and digitally stored. Description: The posterior dominant rhythm  consists of 8 Hz activity of moderate voltage (25-35 uV) seen predominantly in posterior head regions, symmetric and reactive to eye opening and eye closing.  EEG showed condyle generalized polymorphic mixed frequencies with predominantly 8 to 9 Hz alpha activity as well as intermittent generalized 3 to 5 Hz theta-delta slowing.  Hyperventilation and photic stimulation were not performed.   ABNORMALITY -Intermittent slow, generalized IMPRESSION: This study is suggestive of mild diffuse encephalopathy, nonspecific etiology.  No seizures or epileptiform discharges were seen throughout the recording. Priyanka Annabelle Harman   Overnight EEG with video  Result Date: 04/21/2020 Charlsie Quest, MD     04/22/2020 10:53 AM Patient Name: SCOUT GUMBS MRN: [id number] Epilepsy Attending: Charlsie Quest Referring Physician/Provider: Dr. Caryl Pina Duration: 04/20/2020 1117 to 04/21/2020 1117  Patient history: 64 year old male with altered mental status.  EEG to assess for seizures.  Level of alertness: lethargic  AEDs during EEG study: None  Technical aspects: This EEG study was done with scalp electrodes positioned according to the 10-20 International system of electrode placement. Electrical activity was acquired at a sampling rate of 500Hz  and reviewed with a high frequency  filter of  and a low frequency filter of . EEG data were recorded continuously and digitally stored.  Description: No posterior dominant rhythm was seen. Sleep was characterized by vertex waves, sleep spindles (12-14hz ), maximal frontocentral region. EEG showed continuous generalized 3 to 5 Hz theta-delta slowing with overriding 15-18Hz  generalized beta activity.  Hyperventilation and photic stimulation were not performed.   Event button was pressed on 04/20/2020 at around 2100 for seizure like activity described as eye fluttering and staring. Concomitant eeg before, during and after the event didn't show any eeg event to show seizure.  Another event was captured around 1015 on 04/21/2020 during which patient was noted to have eye fluttering and right arm tremulousness after waking him up. Concomitant eeg before, during and after the event didn't show any eeg event to show seizure.  ABNORMALITY - Continuous slow, generalized - Excessive beta, generalized  IMPRESSION: This study is suggestive of severe diffuse encephalopathy, nonspecific etiology but could be secondary to sedation. Two events were recorded as described above without any eeg change and were most likely not epileptic. No seizures or epileptiform discharges were seen throughout the recording.  Charlsie Quest   ECHOCARDIOGRAM COMPLETE  Result Date: 04/19/2020    ECHOCARDIOGRAM REPORT   Patient Name:   DARELL SAPUTO Date of Exam: 04/19/2020 Medical Rec #:  [id number]          Height:       71.0 in Accession #:    [id number]         Weight:       138.2 lb Date of Birth:  1956-05-20         BSA:          1.802 m Patient Age:    63 years           BP:           127/77 mmHg Patient Gender: M                  HR:           74 bpm. Exam Location:  Inpatient Procedure: 2D Echo Indications:    Palpitations R00.2  History:        Patient has prior history of Echocardiogram examinations, most                 recent 01/09/2011. CAD, COPD; Risk Factors:Hypertension,                 Dyslipidemia, Alcohol Abuse and Current Smoker.  Sonographer:    Thurman Coyer RDCS (AE) Referring Phys: [id number] EMILY B MULLEN  Sonographer Comments: Echo performed with patient supine and on artificial respirator. IMPRESSIONS  1. Left ventricular ejection fraction, by estimation, is 65 to 70%. The left ventricle has normal function. The left ventricle has no regional wall motion abnormalities. Left ventricular diastolic parameters were normal.  2. Right ventricular systolic function is normal. The right ventricular size is normal. There is normal pulmonary artery systolic pressure.  3. The mitral valve is  normal in structure. No evidence of mitral valve regurgitation. No evidence of mitral stenosis.  4. The aortic valve is tricuspid. Aortic valve regurgitation is not visualized. No aortic stenosis is present.  5. The inferior vena cava is normal in size with greater than 50% respiratory variability, suggesting right atrial pressure of 3 mmHg. FINDINGS  Left Ventricle: Left ventricular ejection fraction, by estimation, is 65 to 70%. The left ventricle has normal function. The left  ventricle has no regional wall motion abnormalities. The left ventricular internal cavity size was normal in size. There is  no left ventricular hypertrophy. Left ventricular diastolic parameters were normal. Right Ventricle: The right ventricular size is normal. No increase in right ventricular wall thickness. Right ventricular systolic function is normal. There is normal pulmonary artery systolic pressure. The tricuspid regurgitant velocity is 1.86 m/s, and  with an assumed right atrial pressure of 15 mmHg, the estimated right ventricular systolic pressure is 28.8 mmHg. Left Atrium: Left atrial size was normal in size. Right Atrium: Right atrial size was normal in size. Pericardium: There is no evidence of pericardial effusion. Mitral Valve: The mitral valve is normal in structure. Normal mobility of the mitral valve leaflets. No evidence of mitral valve regurgitation. No evidence of mitral valve stenosis. Tricuspid Valve: The tricuspid valve is normal in structure. Tricuspid valve regurgitation is trivial. No evidence of tricuspid stenosis. Aortic Valve: The aortic valve is tricuspid. Aortic valve regurgitation is not visualized. No aortic stenosis is present. Pulmonic Valve: The pulmonic valve was normal in structure. Pulmonic valve regurgitation is not visualized. No evidence of pulmonic stenosis. Aorta: The aortic root is normal in size and structure. Venous: The inferior vena cava is normal in size with greater than 50% respiratory  variability, suggesting right atrial pressure of 3 mmHg. IAS/Shunts: No atrial level shunt detected by color flow Doppler.  LEFT VENTRICLE PLAX 2D LVIDd:         3.80 cm  Diastology LVIDs:         2.40 cm  LV e' lateral:   0.11 cm/s LV PW:         1.00 cm  LV E/e' lateral: 5.3 LV IVS:        0.90 cm  LV e' medial:    0.09 cm/s LVOT diam:     2.00 cm  LV E/e' medial:  6.5 LV SV:         72 LV SV Index:   40 LVOT Area:     3.14 cm  RIGHT VENTRICLE RV S prime:     12.40 cm/s TAPSE (M-mode): 2.0 cm LEFT ATRIUM             Index       RIGHT ATRIUM           Index LA diam:        2.40 cm 1.33 cm/m  RA Area:     11.40 cm LA Vol (A2C):   38.0 ml 21.09 ml/m RA Volume:   20.90 ml  11.60 ml/m LA Vol (A4C):   40.9 ml 22.69 ml/m LA Biplane Vol: 42.0 ml 23.30 ml/m  AORTIC VALVE LVOT Vmax:   109.00 cm/s LVOT Vmean:  73.600 cm/s LVOT VTI:    0.230 m  AORTA Ao Root diam: 4.00 cm MITRAL VALVE               TRICUSPID VALVE MV Area (PHT): 3.68 cm    TR Peak grad:   13.8 mmHg MV Decel Time: 206 msec    TR Vmax:        186.00 cm/s MV E velocity: 0.58 cm/s MV A velocity: 53.80 cm/s  SHUNTS MV E/A ratio:  0.01        Systemic VTI:  0.23 m                            Systemic Diam: 2.00 cm Chilton Si  MD Electronically signed by Chilton Si MD Signature Date/Time: 04/19/2020/6:11:01 PM    Final     Lab Results:  CBC    Component Value Date/Time   WBC 10.8 (H) 04/23/2020 0832   RBC 3.48 (L) 04/23/2020 0832   HGB 12.0 (L) 04/23/2020 0832   HCT 37.5 (L) 04/23/2020 0832   PLT 112 (L) 04/23/2020 0832   MCV 107.8 (H) 04/23/2020 0832   MCH 34.5 (H) 04/23/2020 0832   MCHC 32.0 04/23/2020 0832   RDW 13.4 04/23/2020 0832   LYMPHSABS 0.2 (L) 04/21/2020 0259   MONOABS 1.0 04/21/2020 0259   EOSABS 0.0 04/21/2020 0259   BASOSABS 0.0 04/21/2020 0259    BMET    Component Value Date/Time   NA 141 04/23/2020 0832   NA 142 12/01/2019 1311   K 4.0 04/23/2020 0832   CL 102 04/23/2020 0832   CO2 33 (H) 04/23/2020  0832   GLUCOSE 91 04/23/2020 0832   GLUCOSE 123 (H) 08/15/2006 0929   BUN 25 (H) 04/23/2020 0832   BUN 20 12/01/2019 1311   CREATININE 0.75 04/23/2020 0832   CALCIUM 8.9 04/23/2020 0832   GFRNONAA >60 04/23/2020 0832   GFRAA >60 04/23/2020 0832    BNP    Component Value Date/Time   BNP 45.6 04/18/2020 2334    ProBNP    Component Value Date/Time   PROBNP 3,582 (H) 11/23/2019 1237    Specialty Problems      Pulmonary Problems   Allergic rhinitis, cause unspecified   Dyspnea   Epistaxis   COPD MZ/ GOLD II spirometry  but 02 dep/ hypercarbic as of 11/2019    Active smoker - PFT's 10/01/06 FEV1 2.23 (60%) ratio 47 with 5% resp to saba and DLCO 102% and classic curvature to f/v loop  - 12/25/2018  After extensive coaching inhaler device,  effectiveness =    75% so try BEVESPI 2bid changed to breztri by pharmacy due to access  - Labs ordered 03/09/2020  :    alpha one AT phenotype  = MZ  level 110       Chronic respiratory failure with hypoxia (HCC)    11/23/2019-presented to office on room air, satting 64% 11/23/2019-discharge from office on 2 L, 24/7      Chronic respiratory failure with hypoxia and hypercapnia (HCC)    HC03  12/11/19  = 42       Acute respiratory failure with hypercapnia (HCC)      Allergies  Allergen Reactions  . Penicillins Other (See Comments)    Passes out    Immunization History  Administered Date(s) Administered  . Moderna SARS-COVID-2 Vaccination 12/15/2019, 01/12/2020    Past Medical History:  Diagnosis Date  . Alcohol abuse 12/27/2010  . Allergic rhinitis, cause unspecified 12/27/2010  . Anal warts 04/11/2012  . CAD, NATIVE VESSEL 10/04/2010  . COPD (chronic obstructive pulmonary disease) (HCC)    "a touch" (09/28/2013)  . History of blood transfusion 03/2012   related to nose bleed  . HYPERLIPIDEMIA-MIXED 10/04/2010  . HYPERTENSION, BENIGN 10/04/2010  . Myocardial infarction (HCC) 09/16/2010  . Pneumonia    "when I was a kid"  . TOBACCO  ABUSE 10/04/2010    Tobacco History: Social History   Tobacco Use  Smoking Status Former Smoker  . Packs/day: 2.00  . Years: 50.00  . Pack years: 100.00  . Types: Cigarettes  . Quit date: 04/18/2020  . Years since quitting: 0.0  Smokeless Tobacco Never Used  Tobacco Comment   2  plus packs per day. He has a 100 + pack - year history of tobacco abuse currently. Former 4 ppd for 25 years.   Counseling given: Yes Comment: 2 plus packs per day. He has a 100 + pack - year history of tobacco abuse currently. Former 4 ppd for 25 years.   Continue to not smoke  Outpatient Encounter Medications as of 05/03/2020  Medication Sig  . albuterol (PROAIR HFA) 108 (90 Base) MCG/ACT inhaler 2 puffs every 4 hours as needed only  if your can't catch your breath  . aspirin EC 81 MG tablet Take 81 mg by mouth daily.  Marland Kitchen atorvastatin (LIPITOR) 40 MG tablet Take 1 tablet (40 mg total) by mouth daily.  . Budeson-Glycopyrrol-Formoterol (BREZTRI AEROSPHERE) 160-9-4.8 MCG/ACT AERO Inhale 2 puffs into the lungs in the morning and at bedtime.  . furosemide (LASIX) 40 MG tablet Take 1 tablet (40 mg total) by mouth 2 (two) times daily. (Patient taking differently: Take 40 mg by mouth daily. )  . levETIRAcetam (KEPPRA) 250 MG tablet On 8/4, take 250 mg by mouth in the  Morning. From 8/5 to 8/8, take 250 mg by mouth in the morning and evening. From 8/9 to 8/11, take 250 mg by mouth in the morning. Then stop.  . Multiple Vitamin (MULTIVITAMIN WITH MINERALS) TABS tablet Take 1 tablet by mouth daily.  . nitroGLYCERIN (NITROSTAT) 0.4 MG SL tablet Place 1 tablet (0.4 mg total) under the tongue every 5 (five) minutes as needed for chest pain.  . potassium chloride (KLOR-CON) 10 MEQ tablet Take 1 tablet (10 mEq total) by mouth daily for 28 days.  . predniSONE (DELTASONE) 10 MG tablet Take prednisone 15 mg (1.5 of the 10 mg tablets) each for 3 days. Take prednisone 10 mg each day for 3 days. Take prednisone 5 mg each day for 3  days. Then stop.  . Budeson-Glycopyrrol-Formoterol (BREZTRI AEROSPHERE) 160-9-4.8 MCG/ACT AERO Inhale 2 puffs into the lungs 2 (two) times daily.   No facility-administered encounter medications on file as of 05/03/2020.     Review of Systems  Review of Systems  Constitutional: Positive for fatigue. Negative for activity change, chills, fever and unexpected weight change.  HENT: Negative for postnasal drip, rhinorrhea, sinus pressure, sinus pain and sore throat.   Eyes: Negative.   Respiratory: Positive for shortness of breath. Negative for cough and wheezing.   Cardiovascular: Negative for chest pain and palpitations.  Gastrointestinal: Negative for constipation, diarrhea, nausea and vomiting.  Endocrine: Negative.   Genitourinary: Negative.   Musculoskeletal: Negative.   Skin: Negative.   Neurological: Negative for dizziness and headaches.  Psychiatric/Behavioral: Negative.  Negative for dysphoric mood. The patient is not nervous/anxious.   All other systems reviewed and are negative.    Physical Exam  BP (!) 98/58 (BP Location: Left Arm, Patient Position: Sitting, Cuff Size: Normal)   Pulse (!) 108   Temp (!) 97.5 F (36.4 C) (Oral)   Ht 5\' 11"  (1.803 m)   Wt 142 lb 12.8 oz (64.8 kg)   SpO2 92%   BMI 19.92 kg/m   Wt Readings from Last 5 Encounters:  05/03/20 142 lb 12.8 oz (64.8 kg)  04/24/20 161 lb 2.5 oz (73.1 kg)  03/22/20 142 lb (64.4 kg)  03/09/20 142 lb (64.4 kg)  12/07/19 146 lb (66.2 kg)    BMI Readings from Last 5 Encounters:  05/03/20 19.92 kg/m  04/24/20 22.48 kg/m  03/22/20 19.80 kg/m  03/09/20 19.80 kg/m  12/07/19 20.36 kg/m  Physical Exam Vitals and nursing note reviewed.  Constitutional:      General: He is not in acute distress.    Appearance: Normal appearance. He is normal weight.  HENT:     Head: Normocephalic and atraumatic.     Right Ear: Hearing, tympanic membrane, ear canal and external ear normal. There is no impacted  cerumen.     Left Ear: Hearing, tympanic membrane, ear canal and external ear normal. There is no impacted cerumen.     Nose: Nose normal. No mucosal edema, congestion or rhinorrhea.     Right Turbinates: Not enlarged.     Left Turbinates: Not enlarged.     Mouth/Throat:     Mouth: Mucous membranes are dry.     Pharynx: Oropharynx is clear. No oropharyngeal exudate.  Eyes:     Pupils: Pupils are equal, round, and reactive to light.  Cardiovascular:     Rate and Rhythm: Normal rate and regular rhythm.     Pulses: Normal pulses.     Heart sounds: Normal heart sounds. No murmur heard.   Pulmonary:     Effort: Pulmonary effort is normal.     Breath sounds: Normal breath sounds. No decreased breath sounds, wheezing or rales.  Musculoskeletal:     Cervical back: Normal range of motion.     Right lower leg: No edema.     Left lower leg: No edema.  Lymphadenopathy:     Cervical: No cervical adenopathy.  Skin:    General: Skin is warm and dry.     Capillary Refill: Capillary refill takes less than 2 seconds.     Findings: No erythema or rash.  Neurological:     General: No focal deficit present.     Mental Status: He is alert and oriented to person, place, and time.     Motor: No weakness.     Coordination: Coordination normal.     Gait: Gait is intact. Gait normal.  Psychiatric:        Mood and Affect: Mood normal.        Behavior: Behavior normal. Behavior is cooperative.        Thought Content: Thought content normal.        Judgment: Judgment normal.       Assessment & Plan:   COPD MZ/ GOLD II spirometry  but 02 dep/ hypercarbic as of 11/2019 Plan:  Continue Breztri  Samples provided today Referral to community health and wellness this patient has no insurance Contacted Jen Dixon-within [facility] to help with coordinating care for the patient We will have patient establish care with Dr. Craige Cotta and 30-minute time slot Continue to not smoke Contacted [name] to see what it  external resources they may have in the outpatient setting to help with the patient Continue oxygen therapy at this time   Chronic respiratory failure with hypoxia and hypercapnia (HCC) 04/18/2020-ABG-pH 7.3, PCO2 104, PO2 104, bicarb 50.6  12/09/2019-pulmonary function tests FVC 1.67 (34% predicted), postbronchodilator ratio 37, postbronchodilator FEV1 0.59 (16% predicted), no bronchodilator response, DLCO 7.65 (27% predicted)  Patient may qualify for noninvasive ventilator at home.  He likely would benefit given the severity of his COPD as well as previous ABG showing hypercarbia. Current barrier is his lack of insurance We will work on trying to provide additional resources to help the patient.  Plan: Continue oxygen therapy Bmet today We will have patient establish care with Dr. Craige Cotta and 30-minute time slot    Does not have health  insurance Plan: Have attempted to access additional resources within the [facility] system Have requested additional support from [name]  Complex care coordination Recent hospitalization Without insurance High risk for readmission Very severe COPD with hypercarbia, oxygen dependent May benefit from noninvasive ventilator   Addendum: 05/03/2020 Late had Was able to discuss the case with social work team from [employer] and wellness.  They believe the patient can get established at [facility] clinic.  Patient also may qualify for at home paramedic support.  They will work on coordinating following up with the patient to help with access to community resources.  Financial assistance form from [facility] health also provided to the patient in clinic today.  Also discussed case with Haynes Bast, RN with palliative care/hospice.  Patient would likely qualify for hospice given his poor pulmonary function testing.  This could be an option or something to consider as then patient would be able to receive his oxygen for free.  Can further review and discuss at next  visit.  We will continue to see what other community resources are available to the patient.   Return in about 6 weeks (around 06/14/2020) for Follow up with Dr. Craige Cotta, 30 MINUTE SLOT.   Coral Ceo, NP 05/03/2020   This appointment required 65 minutes of patient care (this includes precharting, chart review, review of results, face-to-face care, etc.).

## 2020-05-03 NOTE — Telephone Encounter (Signed)
Ok noted.  John [name]

## 2020-05-04 ENCOUNTER — Telehealth: Payer: Self-pay

## 2020-05-04 NOTE — Telephone Encounter (Signed)
This Case Manager for [facility] received a referral from CM Robyne Peers RN for assistance from [facility] program to assist client in connecting to county resources and a primary care provider.  Called and spoke to patient, Briefly discussed the services available with [facility] in connecting patients to a primary care provider along with our other services. He states he would like to "think about it and call me back". This case manager's name and contact number was given. Also received permission to send him our information in the mail. Letter about [facility] program was placed in the mail today including this CM's business Card.  Plan: Will plan to connect with Janice Norrie RN, [facility] Wellness on Monday 05/08/20 to discuss further.  Francee Nodal RN Case Manager [facility] [place].

## 2020-05-09 ENCOUNTER — Other Ambulatory Visit: Payer: Self-pay | Admitting: Pulmonary Disease

## 2020-05-09 DIAGNOSIS — J9611 Chronic respiratory failure with hypoxia: Secondary | ICD-10-CM

## 2020-05-12 ENCOUNTER — Other Ambulatory Visit: Payer: Self-pay

## 2020-05-12 ENCOUNTER — Ambulatory Visit (HOSPITAL_COMMUNITY)
Admission: RE | Admit: 2020-05-12 | Discharge: 2020-05-12 | Disposition: A | Discharge status: Home / Self Care | Payer: Self-pay | Source: Ambulatory Visit | Attending: Pulmonary Disease | Admitting: Pulmonary Disease

## 2020-05-12 DIAGNOSIS — J9611 Chronic respiratory failure with hypoxia: Secondary | ICD-10-CM | POA: Insufficient documentation

## 2020-05-12 LAB — BLOOD GAS, ARTERIAL
Acid-Base Excess: 8.4 mmol/L — ABNORMAL HIGH (ref 0.0–2.0)
Bicarbonate: 33.3 mmol/L — ABNORMAL HIGH (ref 20.0–28.0)
Drawn by: 21179
FIO2: 28
O2 Saturation: 94.9 %
Patient temperature: 37
pCO2 arterial: 54 mmHg — ABNORMAL HIGH (ref 32.0–48.0)
pH, Arterial: 7.406 (ref 7.350–7.450)
pO2, Arterial: 73.4 mmHg — ABNORMAL LOW (ref 83.0–108.0)

## 2020-05-12 NOTE — Progress Notes (Signed)
Called patient with results of recent blood gas and recommendations from Elisha Headland NP. Patient verbalized understanding of results as well as plan to seek urgent care if he becomes more somnolent or confused. Patient verbalized understanding.

## 2020-05-15 ENCOUNTER — Other Ambulatory Visit: Payer: Self-pay | Admitting: Student

## 2020-05-16 NOTE — Progress Notes (Signed)
Chief Complaint  Patient presents with  . Follow-up    diastolic CHF   History of Present Illness: 64 yo male with history of ongoing tobacco abuse, CAD , HTN, hyperlipidemia, chronic diastolic CHF and COPD who is here today for cardiac follow up. He was admitted to [facility] December 2011 with a NSTEMI. Cardiac cath 09/17/10 and was found to have a severe stenosis of the LAD which was treated with a drug eluting stent. He was also found to have moderate disease in the RCA (70%) and Circumflex (70%) managed medically. Cardiac cath June 2012 with stable CAD. He  had 3 admissions in July 2013 with severe epistaxis causing hemorrhagic shock with tachycardia and drop in blood pressure. This was treated with packing and transfusion. His Plavix was stopped. He was admitted January 2016 with unstable angina. Cardiac cath January 2016 with severe mid LAD stenosis, severe mid Circumflex stenosis. I placed a drug eluting stent in the mid LAD and a drug eluting stent in the mid Circumflex. LV function normal. I saw him in May 2016 and he c/o fatigue and dyspnea. His Coreg was stopped and he had resolution of his symptoms. He missed follow up appointments in 2017-2020 due to lack of insurance and cost. I saw him February 2020 with c/o dyspnea and lower extremity edema. He had been off of all medications due to losing his job and having no money. He continued to smoke at least one pack per day and was drinking up to a 6 pack of beer per day. He described dyspnea with minimal exertion, even talking and could not lay flat. He had been seen in the Health.Department in [facility] and workup has been underway for dyspnea. Mention of PFTs but no way to pay for it. I felt that his dyspnea was a combination of underlying lung disease and volume overload. He refused admission or an echo. BNP and BMET were normal. I started Lasix 40 mg daily. I saw him by virtual visit 12/23/18 and he reported great improvement in  his LE edema and dyspnea. He has re-established in the Pulmonary clinic with Dr. Sherene Sires and was started on an inhaler.  I saw him in the office in February 2021 and he c/o worsened dyspnea, cough and was found to have low oxygen saturations. He was seen in the pulmonary office 11/16/19 and felt to have a COPD exacerbation. He was treated with steroids and antibiotics per the pulmonary office with improvement in symptoms. He was admitted to [facility] July 2021 with acute respiratory failure secondary to COPD exacerbation and community acquired pneumonia and was intubated. He was euvolemic on admission per notes but developed LE edema during his hospital stay. He was discharged on Lasix 40 mg po BID. Echo 04/19/20 with LVEF=65-70%. No valve disease.   He is here today for follow up. The patient denies any chest pain, palpitations, lower extremity edema, orthopnea, PND, dizziness, near syncope or syncope. He is feeling much better since discharge. He has stopped smoking. He is on supplemental O2 24 hours per day now.   Primary Care Physician: Patient, No Pcp Per  Past Medical History:  Diagnosis Date  . Alcohol abuse 12/27/2010  . Allergic rhinitis, cause unspecified 12/27/2010  . Anal warts 04/11/2012  . CAD, NATIVE VESSEL 10/04/2010  . COPD (chronic obstructive pulmonary disease) (HCC)    "a touch" (09/28/2013)  . History of blood transfusion 03/2012   related to nose bleed  . HYPERLIPIDEMIA-MIXED 10/04/2010  .  HYPERTENSION, BENIGN 10/04/2010  . Myocardial infarction (HCC) 09/16/2010  . Pneumonia    "when I was a kid"  . TOBACCO ABUSE 10/04/2010    Past Surgical History:  Procedure Laterality Date  . CARDIAC CATHETERIZATION  ~ 2012  . CORONARY ANGIOPLASTY WITH STENT PLACEMENT  09/17/2010; 09/28/2014   "1; 2"  . FINGER SURGERY Left    "almost cut off" tip of 2nd digit  . LEFT HEART CATHETERIZATION WITH CORONARY ANGIOGRAM N/A 09/28/2014   Procedure: LEFT HEART CATHETERIZATION WITH CORONARY ANGIOGRAM;  Surgeon:  Kathleene Hazel, MD;  Location: [facility] CATH LAB;  Service: Cardiovascular;  Laterality: N/A;  . NASAL ENDOSCOPY WITH EPISTAXIS CONTROL Bilateral 03/2012  . VASECTOMY      Current Outpatient Medications  Medication Sig Dispense Refill  . albuterol (PROAIR HFA) 108 (90 Base) MCG/ACT inhaler 2 puffs every 4 hours as needed only  if your can't catch your breath 18 g 11  . aspirin EC 81 MG tablet Take 81 mg by mouth daily.    Marland Kitchen atorvastatin (LIPITOR) 40 MG tablet Take 1 tablet (40 mg total) by mouth daily. 90 tablet 3  . Budeson-Glycopyrrol-Formoterol (BREZTRI AEROSPHERE) 160-9-4.8 MCG/ACT AERO Inhale 2 puffs into the lungs in the morning and at bedtime.    . furosemide (LASIX) 40 MG tablet Take 40 mg by mouth daily.    . Multiple Vitamin (MULTIVITAMIN WITH MINERALS) TABS tablet Take 1 tablet by mouth daily.    . nitroGLYCERIN (NITROSTAT) 0.4 MG SL tablet Place 1 tablet (0.4 mg total) under the tongue every 5 (five) minutes as needed for chest pain. 25 tablet 6  . potassium chloride (KLOR-CON) 10 MEQ tablet Take 1 tablet (10 mEq total) by mouth daily for 28 days. 28 tablet 0   No current facility-administered medications for this visit.    Allergies  Allergen Reactions  . Penicillins Other (See Comments)    Passes out    Social History   Socioeconomic History  . Marital status: Single    Spouse name: Not on file  . Number of children: Not on file  . Years of education: Not on file  . Highest education level: Not on file  Occupational History  . Occupation: Geographical information systems officer work     Associate Professor: [employer]    Comment: Ronette Deter  Tobacco Use  . Smoking status: Former Smoker    Packs/day: 2.00    Years: 50.00    Pack years: 100.00    Types: Cigarettes    Quit date: 04/18/2020    Years since quitting: 0.0  . Smokeless tobacco: Never Used  . Tobacco comment: 2 plus packs per day. He has a 100 + pack - year history of tobacco abuse currently. Former 4 ppd for 25 years.   Vaping Use  . Vaping Use: Never used  Substance and Sexual Activity  . Alcohol use: Yes    Alcohol/week: 144.0 standard drinks    Types: 144 Cans of beer per week    Comment: 09/28/2014 "12 pack of beer per night"  . Drug use: No  . Sexual activity: Yes    Birth control/protection: None  Other Topics Concern  . Not on file  Social History Narrative   The patient lives in [place] with his girlfriend. 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   Social Determinants of Health   Financial Resource Strain:   . Difficulty of Paying Living Expenses: Not on file  Food Insecurity:   .  Worried About Programme researcher, broadcasting/film/video in the Last Year: Not on file  . Ran Out of Food in the Last Year: Not on file  Transportation Needs:   . Lack of Transportation (Medical): Not on file  . Lack of Transportation (Non-Medical): Not on file  Physical Activity:   . Days of Exercise per Week: Not on file  . Minutes of Exercise per Session: Not on file  Stress:   . Feeling of Stress : Not on file  Social Connections:   . Frequency of Communication with Friends and Family: Not on file  . Frequency of Social Gatherings with Friends and Family: Not on file  . Attends Religious Services: Not on file  . Active Member of Clubs or Organizations: Not on file  . Attends Banker Meetings: Not on file  . Marital Status: Not on file  Intimate Partner Violence:   . Fear of Current or Ex-Partner: Not on file  . Emotionally Abused: Not on file  . Physically Abused: Not on file  . Sexually Abused: Not on file    Family History  Problem Relation Age of Onset  . Brain cancer Mother   . Heart disease Father     Review of Systems:  As stated in the HPI and otherwise negative.   BP 102/64   Pulse (!) 112   Ht 5\' 11"  (1.803 m)   Wt 147 lb (66.7 kg)   BMI 20.50 kg/m   Physical Examination:  General: Well developed, well nourished, NAD  HEENT: OP clear, mucus membranes  moist  SKIN: warm, dry. No rashes. Neuro: No focal deficits  Musculoskeletal: Muscle strength 5/5 all ext  Psychiatric: Mood and affect normal  Neck: No JVD, no carotid bruits, no thyromegaly, no lymphadenopathy.  Lungs:Clear bilaterally, no wheezes, rhonci, crackles Cardiovascular: Regular rate and rhythm. No murmurs, gallops or rubs. Abdomen:Soft. Bowel sounds present. Non-tender.  Extremities: No lower extremity edema. Pulses are 2 + in the bilateral DP/PT.  Echo 04/19/20: 1. Left ventricular ejection fraction, by estimation, is 65 to 70%. The  left ventricle has normal function. The left ventricle has no regional  wall motion abnormalities. Left ventricular diastolic parameters were  normal.  2. Right ventricular systolic function is normal. The right ventricular  size is normal. There is normal pulmonary artery systolic pressure.  3. The mitral valve is normal in structure. No evidence of mitral valve  regurgitation. No evidence of mitral stenosis.  4. The aortic valve is tricuspid. Aortic valve regurgitation is not  visualized. No aortic stenosis is present.  5. The inferior vena cava is normal in size with greater than 50%  respiratory variability, suggesting right atrial pressure of 3 mmHg.   Cardiac cath January 2016: PCI Note: He was given an additional 6000 units of IV heparin. He was given Plavix 600 mg po x 1. When the ACT was over 200, I engaged the left main with a XB LAD 3.5 guiding catheter.  Lesion #1: (mid Circumflex): I advanced a Cougar IC wire down the Circumflex. A 2.5 x 12 mm balloon was used to pre-dilate the severe stenosis in the mid Circumflex artery. I then carefully positioned and deployed a 3.5 x 20 mm Promus Premier DES in the mid Circumflex. The stent was post-dilated with a 3.75 x 15 mm Delanson balloon x 1. The stenosis was taken from 99% down to 0%.  Lesion #2 (mid LAD): I then pulled the wire back and advanced down the LAD. A 2.5  x 12 mm balloon was used  to pre-dilate the mid LAD stenosis. I then carefully positioned a 2.75 x 16 mm Promus Premier DES in the mid LAD, overlapping with the old stent on the distal edge of the old stent. The stent was post-dilated with a 3.0 x 12 mm Lozano balloon x 1. The stenosis was taken from 80% down to 0%.  The sheath was removed from the right radial artery and a Terumo hemostasis band was applied at the arteriotomy site on the right wrist. There were no immediate complications. The patient was taken to the recovery area in stable condition.   Hemodynamic Findings: Central aortic pressure: 99/68 Left ventricular pressure: 99/5/12 Angiographic Findings: Left main: No obstructive disease.  Left Anterior Descending Artery: Large caliber vessel that courses to the apex. There is mild plaque in the proximal vessel.The mid vessel has a patent stent without restenosis. Just beyond the stented segment in the mid LAD there is a focal 80% stenosis. This is seen in several views and is felt to be flow limiting.  Circumflex Artery: Large caliber vessel with small first obtuse marginal branch and large second obtuse marginal branch. The mid AV groove Circumflex has a 99% stenosis. The superior branch of OM2 has a focal 40% stenosis, unchanged from last cath.  Right Coronary Artery: Large dominant vessel with diffuse 50% proximal stenosis, unchanged from last cath.  Left Ventricular Angiogram: LVEF=60-65%.   EKG:  EKG is not ordered today. The ekg ordered today demonstrates   Recent Labs: 11/23/2019: NT-Pro BNP 3,582 04/18/2020: B Natriuretic Peptide 45.6 04/19/2020: TSH 0.688 04/21/2020: ALT 23 04/23/2020: Hemoglobin 12.0; Magnesium 1.6; Platelets 112 05/03/2020: BUN 16; Creatinine, Ser 0.82; Potassium 5.2; Sodium 139   Lipid Panel    Component Value Date/Time   CHOL 139 07/13/2019 0733   TRIG 67 07/13/2019 0733   TRIG 82 08/15/2006 0929   HDL 64 07/13/2019 0733   CHOLHDL 2.2 07/13/2019 0733   CHOLHDL 2.3 11/03/2015  0733   VLDL 23 11/03/2015 0733   LDLCALC 61 07/13/2019 0733     Wt Readings from Last 3 Encounters:  05/17/20 147 lb (66.7 kg)  05/03/20 142 lb 12.8 oz (64.8 kg)  04/24/20 161 lb 2.5 oz (73.1 kg)     Other studies Reviewed: Additional studies/ records that were reviewed today include: . Review of the above records demonstrates:    Assessment and Plan:   1. CAD without angina: No chest pain. LV function normal by echo July 2021. He does not tolerate beta blockers due to fatigue. Will continue ASA and statin.    2. Tobacco abuse: He has stopped smoking.   3. HTN: BP is within normal limits.   4. Hyperlipidemia: LDL at goal in October 2020. Will continue statin  5. Chronic diastolic CHF: LV systolic function normal by echo July 2021. He has been on Lasix 40 mg po daily and his weight is stable. No LE edema. Continue Lasix.     6. Dyspnea/COPD: He is followed in the pulmonary office. Now on continuous supplemental O2.   Current medicines are reviewed at length with the patient today.  The patient does not have concerns regarding medicines.  The following changes have been made:  no change  Labs/ tests ordered today include:   No orders of the defined types were placed in this encounter.   Disposition:   FU with me in 6 months  Signed, Verne Carrow, MD 05/17/2020 9:55 AM    [facility] [place], [place], [place]  [id number] Phone: [phone]; Fax: [phone]

## 2020-05-17 ENCOUNTER — Other Ambulatory Visit: Payer: Self-pay

## 2020-05-17 ENCOUNTER — Telehealth: Payer: Self-pay

## 2020-05-17 ENCOUNTER — Ambulatory Visit (INDEPENDENT_AMBULATORY_CARE_PROVIDER_SITE_OTHER): Payer: Self-pay | Admitting: Cardiovascular Disease

## 2020-05-17 ENCOUNTER — Encounter: Payer: Self-pay | Admitting: Cardiovascular Disease

## 2020-05-17 VITALS — BP 102/64 | HR 112 | Ht 71.0 in | Wt 147.0 lb

## 2020-05-17 DIAGNOSIS — I5032 Chronic diastolic (congestive) heart failure: Secondary | ICD-10-CM

## 2020-05-17 DIAGNOSIS — I10 Essential (primary) hypertension: Secondary | ICD-10-CM

## 2020-05-17 DIAGNOSIS — E7800 Pure hypercholesterolemia, unspecified: Secondary | ICD-10-CM

## 2020-05-17 DIAGNOSIS — I2510 Atherosclerotic heart disease of native coronary artery without angina pectoris: Secondary | ICD-10-CM

## 2020-05-17 DIAGNOSIS — Z720 Tobacco use: Secondary | ICD-10-CM

## 2020-05-17 MED ORDER — NITROGLYCERIN 0.4 MG SL SUBL: 0.4000 mg | SUBLINGUAL_TABLET | SUBLINGUAL | 6 refills | Status: DC | PRN

## 2020-05-17 MED ORDER — ATORVASTATIN CALCIUM 40 MG PO TABS: 40.0000 mg | ORAL_TABLET | Freq: Every day | ORAL | 3 refills | Status: DC

## 2020-05-17 NOTE — Telephone Encounter (Signed)
Call placed to patient to follow up from referral to Shannan Harper CM- [facility].    Regarding financial assistance, he said that he tried to apply for financial assistance with [facility] a few years ago and was told that he has too much in assets. He does not want to try to apply again. This CM explained that the financial counselor may be able to provide him with information about obtaining affordable insurance options and he said he would be checking that on his own, he did not need any assistance.   Regarding assistance with medication costs, he was not interested in additional information.  Regarding establishing care with PCP, he said he is working on that but has not picked one yet.  He confirmed that he has the contact information for Trish and understands that he can contact her if he would like assistance with the process of identifying a PCP.

## 2020-05-17 NOTE — Patient Instructions (Signed)

## 2020-05-18 NOTE — Telephone Encounter (Signed)
Called and spoke with pt letting him know the info stated by Arlys John and the call received from [facility] Child psychotherapist. Pt verbalized understanding and stated he is trying to work with someone in [place] to have PCP established in [place]. Nothing further needed at this time.

## 2020-05-18 NOTE — Telephone Encounter (Signed)
Can we please contact the patient and notify him that we have received an update from the community health and wellness social worker.  It is our recommendation is an office that he utilize all of the resources that they have offered to him.  It is also extremely important that he works with Rosann Auerbach and [facility] and contacts and sets up an appointment with primary care immediately.  He has complex health issues that need to be managed in the outpatient setting or it is just simply a matter of time before he presents back to the hospital.  I recommendations are that he utilize the resources that are being offered despite his thoughts that because he applied years ago he would not qualify anymore.  Elisha Headland, FNP

## 2020-06-14 ENCOUNTER — Ambulatory Visit (INDEPENDENT_AMBULATORY_CARE_PROVIDER_SITE_OTHER): Payer: Self-pay

## 2020-06-14 ENCOUNTER — Ambulatory Visit (INDEPENDENT_AMBULATORY_CARE_PROVIDER_SITE_OTHER): Payer: Self-pay | Admitting: Pulmonary Disease

## 2020-06-14 ENCOUNTER — Encounter: Payer: Self-pay | Admitting: Pulmonary Disease

## 2020-06-14 ENCOUNTER — Other Ambulatory Visit: Payer: Self-pay

## 2020-06-14 VITALS — HR 89 | Temp 97.5°F | Ht 71.0 in | Wt 148.0 lb

## 2020-06-14 DIAGNOSIS — Z7189 Other specified counseling: Secondary | ICD-10-CM

## 2020-06-14 DIAGNOSIS — J9611 Chronic respiratory failure with hypoxia: Secondary | ICD-10-CM

## 2020-06-14 DIAGNOSIS — Z598 Other problems related to housing and economic circumstances: Secondary | ICD-10-CM

## 2020-06-14 DIAGNOSIS — J449 Chronic obstructive pulmonary disease, unspecified: Secondary | ICD-10-CM

## 2020-06-14 DIAGNOSIS — J9612 Chronic respiratory failure with hypercapnia: Secondary | ICD-10-CM

## 2020-06-14 DIAGNOSIS — Z5989 Other problems related to housing and economic circumstances: Secondary | ICD-10-CM

## 2020-06-14 DIAGNOSIS — Z0000 Encounter for general adult medical examination without abnormal findings: Secondary | ICD-10-CM

## 2020-06-14 LAB — BASIC METABOLIC PANEL
BUN: 16 mg/dL (ref 6–23)
CO2: 37 mEq/L — ABNORMAL HIGH (ref 19–32)
Calcium: 9.3 mg/dL (ref 8.4–10.5)
Chloride: 94 mEq/L — ABNORMAL LOW (ref 96–112)
Creatinine, Ser: 0.84 mg/dL (ref 0.40–1.50)
GFR: 92.34 mL/min (ref >60.00)
Glucose, Bld: 90 mg/dL (ref 70–99)
Potassium: 4.1 mEq/L (ref 3.5–5.1)
Sodium: 136 mEq/L (ref 135–145)

## 2020-06-14 NOTE — Assessment & Plan Note (Signed)
Discussion: Patient with severe COPD.  Patient is also high risk for hypercarbic respiratory failure.  Our office has worked diligently to try to coordinate access to local resources to try to help provide the patient the best care.  It continues to be a barrier the patient does not have health insurance.  He declined the resources that our office was able to coordinate.  We discussed this today and I emphasized that it is extremely important he reconsider his stance.  He reports that he will contact the RN case manager team that work to try to offer him resources.  He specifically needs help with financial bills that he is receiving from [facility] health, obtaining a primary care provider, and estimations on insurance.  He feels that since he was declined assistance in 2018 that means that he will automatically be declined in 2021.  Plan: Continue Breztri  Offered high-dose flu vaccine, patient declined will obtain from health department due to no insurance coverage Continue oxygen therapy Lab work today Chest x-ray today Establish care with Dr. Craige Cotta in [place] clinic Patient to contact RN case manager for assistance with resources Once patient obtains insurance coverage likely would benefit from noninvasive ventilation at night

## 2020-06-14 NOTE — Progress Notes (Signed)
@Patient  ID: , male    DOB: February 22, 1956, 64 y.o.   MRN: [id number]  Chief Complaint  Patient presents with  . Follow-up    reports breathing has been "good lately"    Referring provider: No ref. provider found  HPI:  64 year old male former smoker followed in our office for COPD, chronic respiratory failure  PMH: Hyperlipidemia, hypertension, CAD, history of EtOH abuse Smoker/ Smoking History: Former smoker.  Quit July/2021.  100-pack-year smoking history. Maintenance:  Breztri Pt of: Dr. August/2021  06/14/2020  - Visit   64 year old male former smoker followed in our office for COPD and chronic respiratory failure.  Patient is completing a 6-week follow-up with our office today.  Patient was last seen in August/2021.  Patient ports that overall breathing has been stable if not slightly improved over the last 6 weeks.  He is now receiving Breztri from September/2021.  This comes in the mail.  He declined resources /assistance from 06-25-1972 health case managers that our office tried to coordinate to help him with establishing with a primary care provider as well as to help access local resources.  We will discuss and evaluate this today.  Patient continues to have no insurance.  He remains on disability.  Patient also reports that he has received a bill from [facility] health for $25,000.  He is unable to afford this.  When he contacted [place] financial team they suggested that he pay half of it upfront.  He reports that he cannot afford to do this.  He is frustrated.  He is due for the high-dose seasonal flu vaccine given the severity of his lung disease.  He plans on receiving this from a local health department given the fact that he does not have insurance.  Questionaires / Pulmonary Flowsheets:   ACT:  No flowsheet data found.  MMRC: mMRC Dyspnea Scale mMRC Score  06/14/2020 1  05/03/2020 3  11/23/2019 3    Epworth:  No flowsheet data found.  Tests:    04/23/2020-chest x-ray-improving pneumonia in medial left lung base, likely in the lingula, although mild pneumonia persists, stable severe COPD/emphysema  12/09/2019-pulmonary function tests FVC 1.67 (34% predicted), postbronchodilator ratio 37, postbronchodilator FEV1 0.59 (16% predicted), no bronchodilator response, DLCO 7.65 (27% predicted)  04/19/2020-echocardiogram-LV ejection fraction 65 to 70%, right ventricular systolic function is normal, normal pulmonary artery systolic pressure   FENO:  No results found for: NITRICOXIDE  PFT: PFT Results Latest Ref Rng & Units 12/09/2019  FVC-Pre L 1.67  FVC-Predicted Pre % 34  FVC-Post L 1.62  FVC-Predicted Post % 33  Pre FEV1/FVC % % 35  Post FEV1/FCV % % 37  FEV1-Pre L 0.58  FEV1-Predicted Pre % 15  FEV1-Post L 0.59  DLCO uncorrected ml/min/mmHg 7.65  DLCO UNC% % 27  DLCO corrected ml/min/mmHg 7.13  DLCO COR %Predicted % 25  DLVA Predicted % 31    WALK:  SIX MIN WALK 11/23/2019 12/25/2018  Supplimental Oxygen during Test? (L/min) Yes No  O2 Flow Rate 2 -  Type Continuous -  Tech Comments: Patient walked moderate pace and maintained good O2 sats while on 2L patient walked fast past, on 2nd lap had to rest. Pt refused O2 during walk, was SOB during the last two laps.kmw    Imaging: No results found.  Lab Results:  CBC    Component Value Date/Time   WBC 10.8 (H) 04/23/2020 0832   RBC 3.48 (L) 04/23/2020 0832   HGB 12.0 (L)  04/23/2020 0832   HCT 37.5 (L) 04/23/2020 0832   PLT 112 (L) 04/23/2020 0832   MCV 107.8 (H) 04/23/2020 0832   MCH 34.5 (H) 04/23/2020 0832   MCHC 32.0 04/23/2020 0832   RDW 13.4 04/23/2020 0832   LYMPHSABS 0.2 (L) 04/21/2020 0259   MONOABS 1.0 04/21/2020 0259   EOSABS 0.0 04/21/2020 0259   BASOSABS 0.0 04/21/2020 0259    BMET    Component Value Date/Time   NA 139 05/03/2020 1215   NA 142 12/01/2019 1311   K 5.2 (H) 05/03/2020 1215   CL 94 (L) 05/03/2020 1215   CO2 40 (H) 05/03/2020 1215    GLUCOSE 108 (H) 05/03/2020 1215   GLUCOSE 123 (H) 08/15/2006 0929   BUN 16 05/03/2020 1215   BUN 20 12/01/2019 1311   CREATININE 0.82 05/03/2020 1215   CALCIUM 9.4 05/03/2020 1215   GFRNONAA >60 04/23/2020 0832   GFRAA >60 04/23/2020 0832    BNP    Component Value Date/Time   BNP 45.6 04/18/2020 2334    ProBNP    Component Value Date/Time   PROBNP 3,582 (H) 11/23/2019 1237    Specialty Problems      Pulmonary Problems   Allergic rhinitis, cause unspecified   Dyspnea   Epistaxis   COPD MZ/ GOLD II spirometry  but 02 dep/ hypercarbic as of 11/2019    Active smoker - PFT's 10/01/06 FEV1 2.23 (60%) ratio 47 with 5% resp to saba and DLCO 102% and classic curvature to f/v loop  - 12/25/2018  After extensive coaching inhaler device,  effectiveness =    75% so try BEVESPI 2bid changed to breztri by pharmacy due to access  - Labs ordered 03/09/2020  :    alpha one AT phenotype  = MZ  level 110       Chronic respiratory failure with hypoxia (HCC)    11/23/2019-presented to office on room air, satting 64% 11/23/2019-discharge from office on 2 L, 24/7      Chronic respiratory failure with hypoxia and hypercapnia (HCC)    HC03  12/11/19  = 42       Acute respiratory failure with hypercapnia (HCC)      Allergies  Allergen Reactions  . Penicillins Other (See Comments)    Passes out    Immunization History  Administered Date(s) Administered  . Moderna SARS-COVID-2 Vaccination 12/15/2019, 01/12/2020    Past Medical History:  Diagnosis Date  . Alcohol abuse 12/27/2010  . Allergic rhinitis, cause unspecified 12/27/2010  . Anal warts 04/11/2012  . CAD, NATIVE VESSEL 10/04/2010  . COPD (chronic obstructive pulmonary disease) (HCC)    "a touch" (09/28/2013)  . History of blood transfusion 03/2012   related to nose bleed  . HYPERLIPIDEMIA-MIXED 10/04/2010  . HYPERTENSION, BENIGN 10/04/2010  . Myocardial infarction (HCC) 09/16/2010  . Pneumonia    "when I was a kid"  . TOBACCO ABUSE  10/04/2010    Tobacco History: Social History   Tobacco Use  Smoking Status Former Smoker  . Packs/day: 2.00  . Years: 50.00  . Pack years: 100.00  . Types: Cigarettes  . Quit date: 04/18/2020  . Years since quitting: 0.1  Smokeless Tobacco Never Used  Tobacco Comment   2 plus packs per day. He has a 100 + pack - year history of tobacco abuse currently. Former 4 ppd for 25 years.   Counseling given: Not Answered Comment: 2 plus packs per day. He has a 100 + pack - year history of tobacco  abuse currently. Former 4 ppd for 25 years.   Continue to not smoke  Outpatient Encounter Medications as of 06/14/2020  Medication Sig  . albuterol (PROAIR HFA) 108 (90 Base) MCG/ACT inhaler 2 puffs every 4 hours as needed only  if your can't catch your breath  . aspirin EC 81 MG tablet Take 81 mg by mouth daily.  Marland Kitchen atorvastatin (LIPITOR) 40 MG tablet Take 1 tablet (40 mg total) by mouth daily.  . Budeson-Glycopyrrol-Formoterol (BREZTRI AEROSPHERE) 160-9-4.8 MCG/ACT AERO Inhale 2 puffs into the lungs in the morning and at bedtime.  . furosemide (LASIX) 40 MG tablet Take 40 mg by mouth daily.  . Multiple Vitamin (MULTIVITAMIN WITH MINERALS) TABS tablet Take 1 tablet by mouth daily.  . nitroGLYCERIN (NITROSTAT) 0.4 MG SL tablet Place 1 tablet (0.4 mg total) under the tongue every 5 (five) minutes as needed for chest pain.  . [DISCONTINUED] potassium chloride (KLOR-CON) 10 MEQ tablet Take 1 tablet (10 mEq total) by mouth daily for 28 days.   No facility-administered encounter medications on file as of 06/14/2020.     Review of Systems  Review of Systems  Constitutional: Positive for fatigue. Negative for activity change, chills, fever and unexpected weight change.  HENT: Negative for postnasal drip, rhinorrhea, sinus pressure, sinus pain and sore throat.   Eyes: Negative.   Respiratory: Positive for shortness of breath. Negative for cough and wheezing.   Cardiovascular: Negative for chest pain  and palpitations.  Gastrointestinal: Negative for constipation, diarrhea, nausea and vomiting.  Endocrine: Negative.   Genitourinary: Negative.   Musculoskeletal: Negative.   Skin: Negative.   Neurological: Negative for dizziness and headaches.  Psychiatric/Behavioral: Negative.  Negative for dysphoric mood. The patient is not nervous/anxious.   All other systems reviewed and are negative.    Physical Exam  Pulse 89   Temp (!) 97.5 F (36.4 C)   Ht  (1.803 m)   Wt 148 lb (67.1 kg)   SpO2 98%   BMI 20.64 kg/m   Wt Readings from Last 5 Encounters:  06/14/20 148 lb (67.1 kg)  05/17/20 147 lb (66.7 kg)  05/03/20 142 lb 12.8 oz (64.8 kg)  04/24/20 161 lb 2.5 oz (73.1 kg)  03/22/20 142 lb (64.4 kg)    BMI Readings from Last 5 Encounters:  06/14/20 20.64 kg/m  05/17/20 20.50 kg/m  05/03/20 19.92 kg/m  04/24/20 22.48 kg/m  03/22/20 19.80 kg/m     Physical Exam Vitals and nursing note reviewed.  Constitutional:      General: He is not in acute distress.    Appearance: Normal appearance. He is normal weight.  HENT:     Head: Normocephalic and atraumatic.     Right Ear: Hearing and external ear normal.     Left Ear: Hearing and external ear normal.     Nose: Nose normal. No mucosal edema or rhinorrhea.     Right Turbinates: Not enlarged.     Left Turbinates: Not enlarged.     Mouth/Throat:     Mouth: Mucous membranes are dry.     Pharynx: Oropharynx is clear. No oropharyngeal exudate.  Eyes:     Pupils: Pupils are equal, round, and reactive to light.  Cardiovascular:     Rate and Rhythm: Normal rate and regular rhythm.     Pulses: Normal pulses.     Heart sounds: Normal heart sounds. No murmur heard.   Pulmonary:     Effort: Pulmonary effort is normal.     Breath  sounds: No decreased breath sounds, wheezing or rales.     Comments: Diminished breath sounds throughout exam Musculoskeletal:     Cervical back: Normal range of motion.     Right lower leg:  No edema.     Left lower leg: No edema.  Lymphadenopathy:     Cervical: No cervical adenopathy.  Skin:    General: Skin is warm and dry.     Capillary Refill: Capillary refill takes less than 2 seconds.     Findings: No erythema or rash.  Neurological:     General: No focal deficit present.     Mental Status: He is alert and oriented to person, place, and time.     Motor: No weakness.     Coordination: Coordination normal.     Gait: Gait is intact. Gait normal.  Psychiatric:        Mood and Affect: Mood normal.        Behavior: Behavior normal. Behavior is cooperative.        Thought Content: Thought content normal.        Judgment: Judgment normal.     Comments: At times frustrated during interview/exam.  Patient under significant mount of stress regarding financial bills, lack of insurance, coordinating medical care.       Assessment & Plan:   COPD MZ/ GOLD II spirometry  but 02 dep/ hypercarbic as of 11/2019 Discussion: Patient with severe COPD.  Patient is also high risk for hypercarbic respiratory failure.  Our office has worked diligently to try to coordinate access to local resources to try to help provide the patient the best care.  It continues to be a barrier the patient does not have health insurance.  He declined the resources that our office was able to coordinate.  We discussed this today and I emphasized that it is extremely important he reconsider his stance.  He reports that he will contact the RN case manager team that work to try to offer him resources.  He specifically needs help with financial bills that he is receiving from [facility] health, obtaining a primary care provider, and estimations on insurance.  He feels that since he was declined assistance in 2018 that means that he will automatically be declined in 2021.  Plan: Continue Breztri  Offered high-dose flu vaccine, patient declined will obtain from health department due to no insurance coverage Continue  oxygen therapy Lab work today Chest x-ray today Establish care with Dr. Craige Cotta in [place] clinic Patient to contact RN case manager for assistance with resources Once patient obtains insurance coverage likely would benefit from noninvasive ventilation at night  Chronic respiratory failure with hypoxia and hypercapnia (HCC) 04/18/2020-ABG-pH 7.3, PCO2 104, PO2 104, bicarb 50.6  12/09/2019-pulmonary function tests FVC 1.67 (34% predicted), postbronchodilator ratio 37, postbronchodilator FEV1 0.59 (16% predicted), no bronchodilator response, DLCO 7.65 (27% predicted)  Repeat ABG in August/2021 shows slowly increasing PCO2, patient feels that clinically he is doing better  Plan: Continue oxygen therapy Bmet today We will have patient establish care with Dr. Craige Cotta and 30-minute time slot Once patient obtains insurance coverage would likely benefit from noninvasive ventilation    Does not have health insurance This is a large barrier for the patient Patient is high risk for readmission the hospital given the severity of lung disease as well as high risk for hypercarbic respiratory failure  Plan: Encourage patient to contact RN case manager back to obtain information and resources regarding insurance coverage  Complex care coordination Our  office is work diligently to get patient assistance did with outpatient local resources.  Unfortunately when the patient was contacted he declined their help.  We have discussed this today.  I emphasized that this is the best option for him in order to get set up with a primary care provider, hopefully obtain personal health insurance, also obtain assistance from high medical bills.  Plan: Patient to contact RN case manager Patient needs to establish with a primary care provider  Preventative health care Offered high-dose flu vaccine today, explained to patient that there may be high cost of this in our clinic given the fact that he does not have  insurance  Plan: Patient to obtain flu vaccine from health department or primary care when established    Return in about 4 weeks (around 07/12/2020), or if symptoms worsen or fail to improve, for Follow up with Dr. Craige Cotta, 30 MINUTE SLOT.   Coral Ceo, NP 06/14/2020   This appointment required 47 minutes of patient care (this includes precharting, chart review, review of results, face-to-face care, etc.).

## 2020-06-14 NOTE — Patient Instructions (Addendum)
You were seen today by Coral Ceo, NP  for:   1. COPD MZ/ GOLD II spirometry  but 02 dep/ hypercarbic as of 11/2019  - Basic Metabolic Panel (BMET); Future - DG Chest 2 View; Future  Breztri >>> 2 puffs in the morning right when you wake up, rinse out your mouth after use, 12 hours later 2 puffs, rinse after use >>> Take this daily, no matter what >>> This is not a rescue inhaler   Note your daily symptoms > remember "red flags" for COPD:   >>>Increase in cough >>>increase in sputum production >>>increase in shortness of breath or activity  intolerance.   If you notice these symptoms, please call the office to be seen.    2. Chronic respiratory failure with hypoxia and hypercapnia (HCC)  - Basic Metabolic Panel (BMET); Future  Continue oxygen therapy as prescribed  >>>maintain oxygen saturations greater than 88 percent  >>>if unable to maintain oxygen saturations please contact the office  >>>do not smoke with oxygen  >>>can use nasal saline gel or nasal saline rinses to moisturize nose if oxygen causes dryness  3. Complex care coordination 4. Does not have health insurance  Please contact the RN case manager [facility] as previously instructed to help with obtaining a PCP in the area as well as to help with application for community resources  If you are having difficulty getting in touch with them please contact our office    We recommend today:  Orders Placed This Encounter  Procedures  . DG Chest 2 View    Standing Status:   Future    Number of Occurrences:   1    Standing Expiration Date:   10/14/2020    Order Specific Question:   Reason for Exam (SYMPTOM  OR DIAGNOSIS REQUIRED)    Answer:   copd    Order Specific Question:   Preferred imaging location?    Answer:   Internal    Order Specific Question:   Radiology Contrast Protocol - do NOT remove file path    Answer:   [email or web address]  . Basic  Metabolic Panel (BMET)    Standing Status:   Future    Standing Expiration Date:   06/14/2021   Orders Placed This Encounter  Procedures  . DG Chest 2 View  . Basic Metabolic Panel (BMET)   No orders of the defined types were placed in this encounter.   Follow Up:    Return in about 4 weeks (around 07/12/2020), or if symptoms worsen or fail to improve, for Follow up with Dr. Craige Cotta, 30 MINUTE SLOT.   Notification of test results are managed in the following manner: If there are  any recommendations or changes to the  plan of care discussed in office today,  we will contact you and let you know what they are. If you do not hear from [place], then your results are normal and you can view them through your  MyChart account , or a letter will be sent to you. Thank you again for trusting [place] with your care  - Thank you, [facility]    It is flu season:   >>> Best ways to protect herself from the flu: Receive the yearly flu vaccine, practice good hand hygiene washing with soap and also using hand sanitizer when available, eat a nutritious meals, get adequate rest, hydrate appropriately       Please contact the office if your symptoms worsen or you have concerns that  you are not improving.   Thank you for choosing [facility] for your healthcare, and for allowing [place] to partner with you on your healthcare journey. I am thankful to be able to provide care to you today.   Wyn Quaker FNP-C

## 2020-06-14 NOTE — Assessment & Plan Note (Signed)
04/18/2020-ABG-pH 7.3, PCO2 104, PO2 104, bicarb 50.6  12/09/2019-pulmonary function tests FVC 1.67 (34% predicted), postbronchodilator ratio 37, postbronchodilator FEV1 0.59 (16% predicted), no bronchodilator response, DLCO 7.65 (27% predicted)  Repeat ABG in August/2021 shows slowly increasing PCO2, patient feels that clinically he is doing better  Plan: Continue oxygen therapy Bmet today We will have patient establish care with Dr. Craige Cotta and 30-minute time slot Once patient obtains insurance coverage would likely benefit from noninvasive ventilation

## 2020-06-14 NOTE — Assessment & Plan Note (Signed)
This is a large barrier for the patient Patient is high risk for readmission the hospital given the severity of lung disease as well as high risk for hypercarbic respiratory failure  Plan: Encourage patient to contact RN case manager back to obtain information and resources regarding insurance coverage

## 2020-06-14 NOTE — Assessment & Plan Note (Signed)
Offered high-dose flu vaccine today, explained to patient that there may be high cost of this in our clinic given the fact that he does not have insurance  Plan: Patient to obtain flu vaccine from health department or primary care when established

## 2020-06-14 NOTE — Assessment & Plan Note (Signed)
Our office is work diligently to get patient assistance did with outpatient local resources.  Unfortunately when the patient was contacted he declined their help.  We have discussed this today.  I emphasized that this is the best option for him in order to get set up with a primary care provider, hopefully obtain personal health insurance, also obtain assistance from high medical bills.  Plan: Patient to contact RN case manager Patient needs to establish with a primary care provider

## 2020-06-15 ENCOUNTER — Ambulatory Visit: Payer: Self-pay | Admitting: Internal Medicine

## 2020-06-15 NOTE — Progress Notes (Signed)
Reviewed and agree with assessment/plan. ° ° °Jamorris Ndiaye, MD °[facility] °06/15/2020, 7:15 AM °Pager:  [phone] ° °

## 2020-06-16 ENCOUNTER — Ambulatory Visit: Payer: Self-pay | Admitting: Internal Medicine

## 2020-06-16 NOTE — Progress Notes (Signed)
Thanks  Arush Gatliff

## 2020-06-27 ENCOUNTER — Ambulatory Visit: Payer: Self-pay | Admitting: Physician Assistant

## 2020-06-27 ENCOUNTER — Encounter: Payer: Self-pay | Admitting: Physician Assistant

## 2020-06-27 ENCOUNTER — Other Ambulatory Visit: Payer: Self-pay

## 2020-06-27 ENCOUNTER — Other Ambulatory Visit: Payer: Self-pay | Admitting: Physician Assistant

## 2020-06-27 VITALS — BP 90/60 | HR 92 | Temp 98.2°F | Ht 69.0 in | Wt 145.0 lb

## 2020-06-27 DIAGNOSIS — Z9981 Dependence on supplemental oxygen: Secondary | ICD-10-CM

## 2020-06-27 DIAGNOSIS — Z7689 Persons encountering health services in other specified circumstances: Secondary | ICD-10-CM

## 2020-06-27 DIAGNOSIS — Z1211 Encounter for screening for malignant neoplasm of colon: Secondary | ICD-10-CM

## 2020-06-27 DIAGNOSIS — Z125 Encounter for screening for malignant neoplasm of prostate: Secondary | ICD-10-CM

## 2020-06-27 DIAGNOSIS — J449 Chronic obstructive pulmonary disease, unspecified: Secondary | ICD-10-CM

## 2020-06-27 DIAGNOSIS — I2510 Atherosclerotic heart disease of native coronary artery without angina pectoris: Secondary | ICD-10-CM

## 2020-06-27 NOTE — Progress Notes (Unsigned)
ifob

## 2020-06-27 NOTE — Progress Notes (Signed)
BP 90/60   Pulse 92   Temp 98.2 F (36.8 C)   Ht 5\' 9"  (1.753 m)   Wt 145 lb (65.8 kg)   SpO2 98%   BMI 21.41 kg/m    Subjective:    Patient ID: John [name], male    DOB: [birth year], 64 y.o.   MRN: [id number]  HPI: John [name] is a 64 y.o. male presenting on 06/27/2020 for New Patient (Initial Visit)   HPI   Pt had a negative covid 19 screening questionnaire.     Pt is a 63yoM with CAD and COPD who presents to establish care.    He is on disability but says he can't get medicare/medicaid until around April 2022.  He is Seeing pulmonology and cardiology regularly but has had no PCP in years.  He has appointment with pulmonolgy next week.  He has been seeing Dr May 2022 Yvone Neu since early 2020 and is changing to Dr 2021 due to Dr Craige Cotta is now in [place] where the patient lives.   Pt is on breztri and albuterol mdi and stopped smoking in July after a significant history of smoking.   He also wears O2 via [place] at 2L 24hr/day.    He is on recall to go back to cardiology in January.  He has diastolic CHF and CAD.  He had NSTEMI in 2011.  He had stent placed in LAD in 2011.  He had 2 stents placed in 2016.    Pt's plavix was discontinued in 2013 due to severe epistaxis causing hemorrhagic shock.  His coreg was discontinued in 2016 due to fatigue and dyspnea.  Pt is currently having no CP.  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    Pt received his covid vaccination, both doses.   He has no complaints today.             Relevant past medical, surgical, family and social history reviewed and updated as indicated. Interim medical history since our last visit reviewed. Allergies and medications reviewed and updated.   Current Outpatient Medications:  .  albuterol (PROAIR HFA) 108 (90 Base) MCG/ACT inhaler, 2 puffs every 4 hours as needed only  if your can't catch your breath, Disp: 18 g, Rfl: 11 .  aspirin EC 81 MG tablet, Take 81 mg by  mouth daily., Disp: , Rfl:  .  atorvastatin (LIPITOR) 40 MG tablet, Take 1 tablet (40 mg total) by mouth daily., Disp: 90 tablet, Rfl: 3 .  Budeson-Glycopyrrol-Formoterol (BREZTRI AEROSPHERE) 160-9-4.8 MCG/ACT AERO, Inhale 2 puffs into the lungs in the morning and at bedtime., Disp: , Rfl:  .  furosemide (LASIX) 40 MG tablet, Take 40 mg by mouth daily., Disp: , Rfl:  .  Multiple Vitamin (MULTIVITAMIN WITH MINERALS) TABS tablet, Take 1 tablet by mouth daily., Disp: , Rfl:  .  nitroGLYCERIN (NITROSTAT) 0.4 MG SL tablet, Place 1 tablet (0.4 mg total) under the tongue every 5 (five) minutes as needed for chest pain., Disp: 25 tablet, Rfl: 6     Review of Systems  Per HPI unless specifically indicated above     Objective:    BP 90/60   Pulse 92   Temp 98.2 F (36.8 C)   Ht 5\' 9"  (1.753 m)   Wt 145 lb (65.8 kg)   SpO2 98%   BMI 21.41 kg/m   Wt Readings from Last 3 Encounters:  06/27/20 145 lb (65.8 kg)  06/14/20 148 lb (67.1 kg)  05/17/20 147 lb (66.7 kg)    Physical Exam Vitals reviewed.  Constitutional:      General: He is not in acute distress.    Appearance: He is normal weight. He is not toxic-appearing.  HENT:     Head: Normocephalic and atraumatic.     Right Ear: Tympanic membrane, ear canal and external ear normal.     Left Ear: Tympanic membrane, ear canal and external ear normal.     Mouth/Throat:     Pharynx: No oropharyngeal exudate.  Eyes:     Extraocular Movements: Extraocular movements intact.     Conjunctiva/sclera: Conjunctivae normal.     Pupils: Pupils are equal, round, and reactive to light.  Neck:     Thyroid: No thyromegaly.  Cardiovascular:     Rate and Rhythm: Normal rate and regular rhythm.  Pulmonary:     Effort: Pulmonary effort is normal. No respiratory distress.     Breath sounds: No wheezing or rales.  Abdominal:     General: Bowel sounds are normal.     Palpations: Abdomen is soft. There is no mass.     Tenderness: There is no abdominal  tenderness.  Musculoskeletal:     Cervical back: Neck supple.     Right lower leg: No edema.     Left lower leg: No edema.  Lymphadenopathy:     Cervical: No cervical adenopathy.  Skin:    General: Skin is warm and dry.     Findings: No rash.  Neurological:     Mental Status: He is alert and oriented to person, place, and time.     Motor: No weakness or tremor.     Gait: Gait is intact.     Deep Tendon Reflexes:     Reflex Scores:      Patellar reflexes are 2+ on the right side and 2+ on the left side. Psychiatric:        Attention and Perception: Attention normal.        Speech: Speech normal.        Behavior: Behavior normal. Behavior is cooperative.     Comments: Pleasant.  Converses easily.             Assessment & Plan:    Encounter Diagnoses  Name Primary?  . Encounter to establish care Yes  . Atherosclerosis of native coronary artery of native heart without angina pectoris   . Chronic obstructive pulmonary disease, unspecified COPD type (HCC)   . Oxygen dependent   . Screening for colon cancer   . Screening for prostate cancer      -discussed medication costs.  Pt says he is not needing help with paying for his medications at this time.  He is getting his Breztri through patient assistance   -Pt was given ifobt for colon cancer screening  -Pt was given CAFA/application for financial assistance through [facility].  He says he had a $25K bill after an admission in July.  He says he was really worried about what was going to happen if it was left unpaid so he says he took out a loan to pay it.  Discussed with pt that the CAFA could help to cover the cost for him to see the pulmonologist and cardiologist.    -Will Update fasting labs.  He will be called with results  -no changes to medications.  Discussed with pt that he needs to continue with pulmonologist and cardiologist.    -pt to follow up 3-4 months.  He is to contact office sooner prn.

## 2020-06-28 ENCOUNTER — Other Ambulatory Visit (HOSPITAL_COMMUNITY)
Admission: RE | Admit: 2020-06-28 | Discharge: 2020-06-28 | Disposition: A | Discharge status: Home / Self Care | Payer: Self-pay | Source: Ambulatory Visit | Attending: Physician Assistant | Admitting: Physician Assistant

## 2020-06-28 DIAGNOSIS — Z125 Encounter for screening for malignant neoplasm of prostate: Secondary | ICD-10-CM | POA: Insufficient documentation

## 2020-06-28 DIAGNOSIS — I2510 Atherosclerotic heart disease of native coronary artery without angina pectoris: Secondary | ICD-10-CM | POA: Insufficient documentation

## 2020-06-28 LAB — COMPREHENSIVE METABOLIC PANEL
ALT: 26 U/L (ref 0–44)
AST: 27 U/L (ref 15–41)
Albumin: 3.3 g/dL — ABNORMAL LOW (ref 3.5–5.0)
Alkaline Phosphatase: 120 U/L (ref 38–126)
Anion gap: 7 (ref 5–15)
BUN: 11 mg/dL (ref 8–23)
CO2: 35 mmol/L — ABNORMAL HIGH (ref 22–32)
Calcium: 9 mg/dL (ref 8.9–10.3)
Chloride: 97 mmol/L — ABNORMAL LOW (ref 98–111)
Creatinine, Ser: 0.83 mg/dL (ref 0.61–1.24)
GFR calc non Af Amer: >60 mL/min (ref >60)
Glucose, Bld: 99 mg/dL (ref 70–99)
Potassium: 3.9 mmol/L (ref 3.5–5.1)
Sodium: 139 mmol/L (ref 135–145)
Total Bilirubin: 0.5 mg/dL (ref 0.3–1.2)
Total Protein: 7.4 g/dL (ref 6.5–8.1)

## 2020-06-28 LAB — LIPID PANEL
Cholesterol: 153 mg/dL (ref 0–200)
HDL: 59 mg/dL (ref >40)
LDL Cholesterol: 82 mg/dL (ref 0–99)
Total CHOL/HDL Ratio: 2.6 RATIO
Triglycerides: 60 mg/dL (ref <150)
VLDL: 12 mg/dL (ref 0–40)

## 2020-06-28 LAB — PSA: Prostatic Specific Antigen: 2 ng/mL (ref 0.00–4.00)

## 2020-06-29 ENCOUNTER — Other Ambulatory Visit: Payer: Self-pay | Admitting: Physician Assistant

## 2020-06-29 DIAGNOSIS — R195 Other fecal abnormalities: Secondary | ICD-10-CM

## 2020-06-29 LAB — IFOBT (OCCULT BLOOD): IFOBT: POSITIVE

## 2020-07-04 ENCOUNTER — Encounter: Payer: Self-pay | Admitting: Internal Medicine

## 2020-07-05 ENCOUNTER — Ambulatory Visit: Payer: Self-pay | Admitting: Pulmonary Disease

## 2020-07-05 ENCOUNTER — Other Ambulatory Visit: Payer: Self-pay

## 2020-07-05 ENCOUNTER — Encounter: Payer: Self-pay | Admitting: Pulmonary Disease

## 2020-07-05 VITALS — BP 110/62 | HR 88 | Temp 97.1°F | Ht 69.0 in | Wt 149.2 lb

## 2020-07-05 DIAGNOSIS — J9611 Chronic respiratory failure with hypoxia: Secondary | ICD-10-CM

## 2020-07-05 DIAGNOSIS — J9612 Chronic respiratory failure with hypercapnia: Secondary | ICD-10-CM

## 2020-07-05 DIAGNOSIS — J449 Chronic obstructive pulmonary disease, unspecified: Secondary | ICD-10-CM

## 2020-07-05 DIAGNOSIS — Z23 Encounter for immunization: Secondary | ICD-10-CM

## 2020-07-05 MED ORDER — BREZTRI AEROSPHERE 160-9-4.8 MCG/ACT IN AERO
2.0000 | INHALATION_SPRAY | Freq: Two times a day (BID) | RESPIRATORY_TRACT | 0 refills | Status: DC
Start: 2020-07-05 — End: 2020-10-11

## 2020-07-05 NOTE — Patient Instructions (Signed)
Flu shot today  Will have [employer] arrange for extra oxygen tanks  Follow up in 6 months

## 2020-07-05 NOTE — Progress Notes (Signed)
[facility]  Chief Complaint  Patient presents with  . Follow-up    No complaints currently    Constitutional:  BP 110/62 (BP Location: Left Arm, Cuff Size: Normal)   Pulse 88   Temp (!) 97.1 F (36.2 C) (Other (Comment)) Comment (Src): wrist  Ht 5\' 9"  (1.753 m)   Wt 149 lb 3.2 oz (67.7 kg)   SpO2 96% Comment: 2L O2  BMI 22.03 kg/m   Past Medical History:  PNA, HTN, HLD, CAD, Seizure, ETOH  Past Surgical History:  His  has a past surgical history that includes Finger surgery (Left); Vasectomy; Coronary angioplasty with stent (09/17/2010; 09/28/2014); Cardiac catheterization (~ 2012); Nasal endoscopy with epistaxis control (Bilateral, 03/2012); and left heart catheterization with coronary angiogram (N/A, 09/28/2014).  Brief Summary:  John [name] is a 64 y.o. 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      Subjective:   He stopped smoking after he was in the hospital over the Summer.  He isn't having as much cough or sputum.  Not having wheeze.  Using 2 liters oxygen.  Breztri helps.  Was told he would have to wait until April 2022 to be eligible for Medicare.  Physical Exam:   Appearance - well kempt, thin, wearing oxygen  ENMT - no sinus tenderness, no oral exudate, no LAN, Mallampati 2 airway, no stridor  Respiratory - decreased breath sounds bilaterally, no wheezing or rales  CV - s1s2 regular rate and rhythm, no murmurs  Ext - no clubbing, no edema  Skin - no rashes  Psych - normal mood and affect   Pulmonary testing:   PFT 12/09/19 >> FEV1 .059 (16%), FEV1% 37, DLCO 27%  A1AT 03/09/20 >> 110, MZ  ABG 05/12/20 >> pH 7.4, PCO2 54, PO2 73  Chest Imaging:   CT angio chest 11/23/19 >> moderate centrilobular emphysema, mild infrahilar BTX, 6 mm nodule LUL  Cardiac Tests:   Echo 04/19/20 >> EF 65 to 70%  Social History:  He   reports that he quit smoking about 2 months ago. His smoking use included cigarettes. He has a 100.00 pack-year smoking history. He has never used smokeless tobacco. He reports current alcohol use of about 144.0 standard drinks of alcohol per week. He reports that he does not use drugs.  Family History:  His family history includes Brain cancer in his mother; Heart disease in his father.     Assessment/Plan:   COPD from emphysema. - continue breztri; samples given - prn albuterol - influenza vaccine today - will need to discuss pneumococcal vaccination at his next visit - discussed option of referral to lung transplant center for assessment; he doesn't think that this logistically would be feasible for him and opted against this  MZ alpha 1 antitrypsin heterozygote. - his level is sufficient that he does require augmentation therapy - advised him to inform his family that they might need genetic testing  Chronic respiratory failure with hypoxia and hypercapnia. - he uses [employer] for his DME - goal SpO2 > 90% - continue 2 liters oxygen - will see if he can get extra D and/or E tanks  Lung nodule. - Lt upper lobe  - will need follow up CT chest w/o contrast in March 2022  Time Spent Involved in Patient Care on Day of Examination:  24 minutes  Follow up:  Patient Instructions  Flu shot today  Will  have [employer] arrange for extra oxygen tanks  Follow up in 6 months   Medication List:   Allergies as of 07/05/2020      Reactions   Penicillins Other (See Comments)   Passes out      Medication List       Accurate as of July 05, 2020  1:22 PM. If you have any questions, ask your nurse or doctor.        albuterol 108 (90 Base) MCG/ACT inhaler Commonly known as: ProAir HFA 2 puffs every 4 hours as needed only  if your can't catch your breath   aspirin EC 81 MG tablet Take 81 mg by mouth daily.   atorvastatin 40 MG tablet Commonly known as: LIPITOR Take 1 tablet  (40 mg total) by mouth daily.   Breztri Aerosphere 160-9-4.8 MCG/ACT Aero Generic drug: Budeson-Glycopyrrol-Formoterol Inhale 2 puffs into the lungs in the morning and at bedtime. What changed: Another medication with the same name was added. Make sure you understand how and when to take each. Changed by: Coralyn Helling, MD   Jerrye Bushy [phone] MCG/ACT Aero Generic drug: Budeson-Glycopyrrol-Formoterol Inhale 2 puffs into the lungs in the morning and at bedtime. What changed: You were already taking a medication with the same name, and this prescription was added. Make sure you understand how and when to take each. Changed by: Coralyn Helling, MD   furosemide 40 MG tablet Commonly known as: LASIX Take 40 mg by mouth daily.   multivitamin with minerals Tabs tablet Take 1 tablet by mouth daily.   nitroGLYCERIN 0.4 MG SL tablet Commonly known as: Nitrostat Place 1 tablet (0.4 mg total) under the tongue every 5 (five) minutes as needed for chest pain.       Signature:  Coralyn Helling, MD [facility] Pager - [phone] 07/05/2020, 1:22 PM

## 2020-08-14 ENCOUNTER — Ambulatory Visit: Payer: Self-pay | Admitting: Gastroenterology

## 2020-09-25 ENCOUNTER — Other Ambulatory Visit: Payer: Self-pay | Admitting: Physician Assistant

## 2020-09-25 DIAGNOSIS — I2510 Atherosclerotic heart disease of native coronary artery without angina pectoris: Secondary | ICD-10-CM

## 2020-10-10 ENCOUNTER — Other Ambulatory Visit: Payer: Self-pay

## 2020-10-10 ENCOUNTER — Other Ambulatory Visit (HOSPITAL_COMMUNITY)
Admission: RE | Admit: 2020-10-10 | Discharge: 2020-10-10 | Disposition: A | Discharge status: Home / Self Care | Payer: No Typology Code available for payment source | Source: Ambulatory Visit | Attending: Physician Assistant | Admitting: Physician Assistant

## 2020-10-10 DIAGNOSIS — I2510 Atherosclerotic heart disease of native coronary artery without angina pectoris: Secondary | ICD-10-CM | POA: Insufficient documentation

## 2020-10-10 LAB — LIPID PANEL
Cholesterol: 174 mg/dL (ref 0–200)
HDL: 76 mg/dL (ref >40)
LDL Cholesterol: 86 mg/dL (ref 0–99)
Total CHOL/HDL Ratio: 2.3 RATIO
Triglycerides: 60 mg/dL (ref <150)
VLDL: 12 mg/dL (ref 0–40)

## 2020-10-10 LAB — COMPREHENSIVE METABOLIC PANEL
ALT: 23 U/L (ref 0–44)
AST: 26 U/L (ref 15–41)
Albumin: 3.6 g/dL (ref 3.5–5.0)
Alkaline Phosphatase: 122 U/L (ref 38–126)
Anion gap: 8 (ref 5–15)
BUN: 13 mg/dL (ref 8–23)
CO2: 36 mmol/L — ABNORMAL HIGH (ref 22–32)
Calcium: 9.2 mg/dL (ref 8.9–10.3)
Chloride: 96 mmol/L — ABNORMAL LOW (ref 98–111)
Creatinine, Ser: 1.05 mg/dL (ref 0.61–1.24)
GFR, Estimated: >60 mL/min (ref >60)
Glucose, Bld: 109 mg/dL — ABNORMAL HIGH (ref 70–99)
Potassium: 3.4 mmol/L — ABNORMAL LOW (ref 3.5–5.1)
Sodium: 140 mmol/L (ref 135–145)
Total Bilirubin: 0.3 mg/dL (ref 0.3–1.2)
Total Protein: 6.9 g/dL (ref 6.5–8.1)

## 2020-10-11 ENCOUNTER — Ambulatory Visit: Payer: Self-pay | Admitting: Physician Assistant

## 2020-10-11 ENCOUNTER — Other Ambulatory Visit: Payer: Self-pay

## 2020-10-11 ENCOUNTER — Encounter: Payer: Self-pay | Admitting: Physician Assistant

## 2020-10-11 VITALS — BP 136/74 | HR 108 | Temp 97.3°F | Ht 69.0 in | Wt 152.0 lb

## 2020-10-11 DIAGNOSIS — Z23 Encounter for immunization: Secondary | ICD-10-CM

## 2020-10-11 DIAGNOSIS — Z9981 Dependence on supplemental oxygen: Secondary | ICD-10-CM

## 2020-10-11 DIAGNOSIS — I2510 Atherosclerotic heart disease of native coronary artery without angina pectoris: Secondary | ICD-10-CM

## 2020-10-11 DIAGNOSIS — J449 Chronic obstructive pulmonary disease, unspecified: Secondary | ICD-10-CM

## 2020-10-11 DIAGNOSIS — E785 Hyperlipidemia, unspecified: Secondary | ICD-10-CM

## 2020-10-11 NOTE — Patient Instructions (Signed)
Pneumococcal Polysaccharide Vaccine (PPSV23): What You Need to Know 1. Why get vaccinated? Pneumococcal polysaccharide vaccine (PPSV23) can prevent pneumococcal disease. Pneumococcal disease refers to any illness caused by pneumococcal bacteria. These bacteria can cause many types of illnesses, including pneumonia, which is an infection of the lungs. Pneumococcal bacteria are one of the most common causes of pneumonia. Besides pneumonia, pneumococcal bacteria can also cause:  Ear infections  Sinus infections  Meningitis (infection of the tissue covering the brain and spinal cord)  Bacteremia (bloodstream infection) Anyone can get pneumococcal disease, but children under 2 years of age, people with certain medical conditions, adults 65 years or older, and cigarette smokers are at the highest risk. Most pneumococcal infections are mild. However, some can result in long-term problems, such as brain damage or hearing loss. Meningitis, bacteremia, and pneumonia caused by pneumococcal disease can be fatal. 2. PPSV23 PPSV23 protects against 23 types of bacteria that cause pneumococcal disease. PPSV23 is recommended for:  All adults 65 years or older,  Anyone 2 years or older with certain medical conditions that can lead to an increased risk for pneumococcal disease. Most people need only one dose of PPSV23. A second dose of PPSV23, and another type of pneumococcal vaccine called PCV13, are recommended for certain high-risk groups. Your health care provider can give you more information. People 65 years or older should get a dose of PPSV23 even if they have already gotten one or more doses of the vaccine before they turned 65. 3. Talk with your health care provider Tell your vaccine provider if the person getting the vaccine:  Has had an allergic reaction after a previous dose of PPSV23, or has any severe, life-threatening allergies. In some cases, your health care provider may decide to postpone  PPSV23 vaccination to a future visit. People with minor illnesses, such as a cold, may be vaccinated. People who are moderately or severely ill should usually wait until they recover before getting PPSV23. Your health care provider can give you more information. 4. Risks of a vaccine reaction  Redness or pain where the shot is given, feeling tired, fever, or muscle aches can happen after PPSV23. People sometimes faint after medical procedures, including vaccination. Tell your provider if you feel dizzy or have vision changes or ringing in the ears. As with any medicine, there is a very remote chance of a vaccine causing a severe allergic reaction, other serious injury, or death. 5. What if there is a serious problem? An allergic reaction could occur after the vaccinated person leaves the clinic. If you see signs of a severe allergic reaction (hives, swelling of the face and throat, difficulty breathing, a fast heartbeat, dizziness, or weakness), call 9-1-1 and get the person to the nearest hospital. For other signs that concern you, call your health care provider. Adverse reactions should be reported to the Vaccine Adverse Event Reporting System (VAERS). Your health care provider will usually file this report, or you can do it yourself. Visit the VAERS website at www.vaers.hhs.gov or call 1-800-822-7967. VAERS is only for reporting reactions, and VAERS staff do not give medical advice. 6. How can I learn more?  Ask your health care provider.  Call your local or state health department.  Contact the Centers for Disease Control and Prevention (CDC): ? Call 1-800-232-4636 (1-800-CDC-INFO) or ? Visit CDC's website at www.cdc.gov/vaccines Vaccine Information Statement PPSV23 Vaccine (07/22/2018) This information is not intended to replace advice given to you by your health care provider. Make sure you discuss   any questions you have with your health care provider. Document Revised: 05/12/2020  Document Reviewed: 05/12/2020 Elsevier Patient Education  2021 Elsevier Inc.   

## 2020-10-11 NOTE — Progress Notes (Signed)
BP 136/74   Pulse (!) 108   Temp (!) 97.3 F (36.3 C)   Ht 5\' 9"  (1.753 m)   Wt 152 lb (68.9 kg)   SpO2 99%   BMI 22.45 kg/m    Subjective:    Patient ID: John [name], male    DOB: 1956-03-11, 65 y.o.   MRN: [id number]  HPI: John [name] is a 65 y.o. male presenting on 10/11/2020 for Follow-up   HPI   Pt had a negative covid 19 screening questionnaire.      Pt with COPD, CAD scheduld to start medicare in April this year  He has gotten his covid booster  He is still seeing Dr May for pulmonology and is supposed to return there in April  He is scheduled for f/u with cardiology in March.  He says he is doing fine and has had no recent new problems.       Relevant past medical, surgical, family and social history reviewed and updated as indicated. Interim medical history since our last visit reviewed. Allergies and medications reviewed and updated.   Current Outpatient Medications:  .  albuterol (PROAIR HFA) 108 (90 Base) MCG/ACT inhaler, 2 puffs every 4 hours as needed only  if your can't catch your breath, Disp: 18 g, Rfl: 11 .  aspirin EC 81 MG tablet, Take 81 mg by mouth daily., Disp: , Rfl:  .  atorvastatin (LIPITOR) 40 MG tablet, Take 1 tablet (40 mg total) by mouth daily., Disp: 90 tablet, Rfl: 3 .  Budeson-Glycopyrrol-Formoterol (BREZTRI AEROSPHERE) 160-9-4.8 MCG/ACT AERO, Inhale 2 puffs into the lungs in the morning and at bedtime., Disp: , Rfl:  .  furosemide (LASIX) 40 MG tablet, Take 40 mg by mouth daily., Disp: , Rfl:  .  Multiple Vitamin (MULTIVITAMIN WITH MINERALS) TABS tablet, Take 1 tablet by mouth daily., Disp: , Rfl:  .  nitroGLYCERIN (NITROSTAT) 0.4 MG SL tablet, Place 1 tablet (0.4 mg total) under the tongue every 5 (five) minutes as needed for chest pain., Disp: 25 tablet, Rfl: 6 .  OXYGEN, Inhale 2-3 L into the lungs., Disp: , Rfl:     Review of Systems  Per HPI unless specifically indicated above     Objective:    BP  136/74   Pulse (!) 108   Temp (!) 97.3 F (36.3 C)   Ht 5\' 9"  (1.753 m)   Wt 152 lb (68.9 kg)   SpO2 99%   BMI 22.45 kg/m   Wt Readings from Last 3 Encounters:  10/11/20 152 lb (68.9 kg)  07/05/20 149 lb 3.2 oz (67.7 kg)  06/27/20 145 lb (65.8 kg)    Physical Exam Vitals reviewed.  Constitutional:      General: He is not in acute distress.    Appearance: He is well-developed and well-nourished. He is not toxic-appearing.  HENT:     Head: Normocephalic and atraumatic.  Cardiovascular:     Rate and Rhythm: Normal rate and regular rhythm.  Pulmonary:     Effort: Pulmonary effort is normal.     Breath sounds: Normal breath sounds. No wheezing.  Abdominal:     General: Bowel sounds are normal.     Palpations: Abdomen is soft. There is no hepatosplenomegaly.     Tenderness: There is no abdominal tenderness.  Musculoskeletal:        General: No edema.     Cervical back: Neck supple.     Right lower leg: No edema.  Left lower leg: No edema.  Lymphadenopathy:     Cervical: No cervical adenopathy.  Skin:    General: Skin is warm and dry.  Neurological:     Mental Status: He is alert and oriented to person, place, and time.  Psychiatric:        Mood and Affect: Mood and affect normal.        Behavior: Behavior normal.     Results for orders placed or performed during the hospital encounter of 10/10/20  Lipid panel  Result Value Ref Range   Cholesterol 174 0 - 200 mg/dL   Triglycerides 60 <657 mg/dL   HDL 76 >84 mg/dL   Total CHOL/HDL Ratio 2.3 RATIO   VLDL 12 0 - 40 mg/dL   LDL Cholesterol 86 0 - 99 mg/dL  Comprehensive metabolic panel  Result Value Ref Range   Sodium 140 135 - 145 mmol/L   Potassium 3.4 (L) 3.5 - 5.1 mmol/L   Chloride 96 (L) 98 - 111 mmol/L   CO2 36 (H) 22 - 32 mmol/L   Glucose, Bld 109 (H) 70 - 99 mg/dL   BUN 13 8 - 23 mg/dL   Creatinine, Ser 6.96 0.61 - 1.24 mg/dL   Calcium 9.2 8.9 - 29.5 mg/dL   Total Protein 6.9 6.5 - 8.1 g/dL   Albumin  3.6 3.5 - 5.0 g/dL   AST 26 15 - 41 U/L   ALT 23 0 - 44 U/L   Alkaline Phosphatase 122 38 - 126 U/L   Total Bilirubin 0.3 0.3 - 1.2 mg/dL   GFR, Estimated >28 >41 mL/min   Anion gap 8 5 - 15      Assessment & Plan:    Encounter Diagnoses  Name Primary?  . Chronic obstructive pulmonary disease, unspecified COPD type (HCC) Yes  . Oxygen dependent   . Need for pneumococcal vaccination   . Atherosclerosis of native coronary artery of native heart without angina pectoris   . Hyperlipidemia, unspecified hyperlipidemia type       -reviewed labs with pt  -pt to continue current medications -pt to follow up with specialists per their recommendations -pt is given pneumonia vaccination in office today -pt to follow up in 3 months.  He is to contact office sooner prn. -pt will notify office and cancel his follow up if his insurance takes effect

## 2020-10-13 IMAGING — DX DG CHEST 2V
2 series · 2 of 2 positions shown · non-contrast
Comparison: 11/23/2019, 03/18/2018

CLINICAL DATA: Shortness of breath for 2 weeks

EXAM:
CHEST - 2 VIEW

[chest pa]
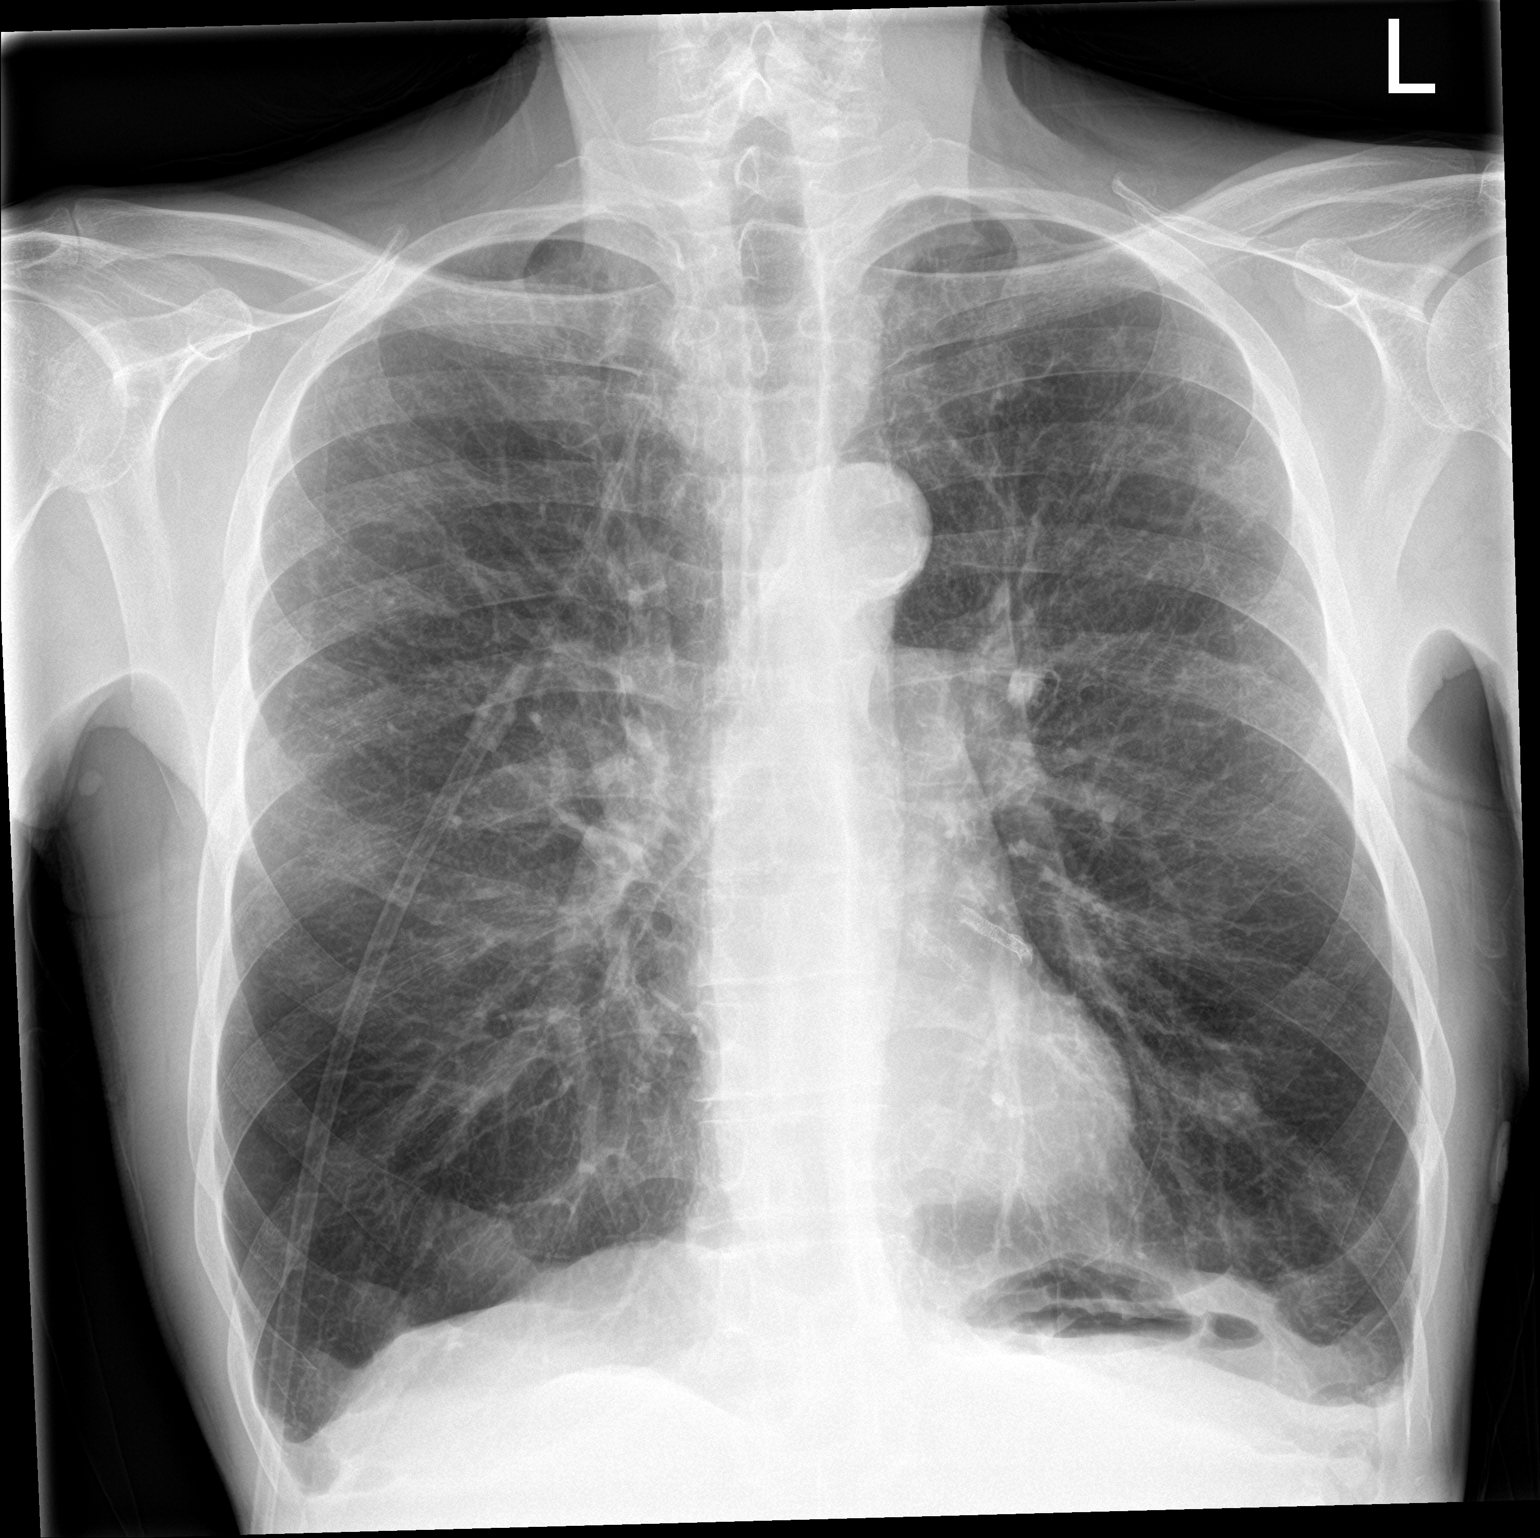

[chest lat]
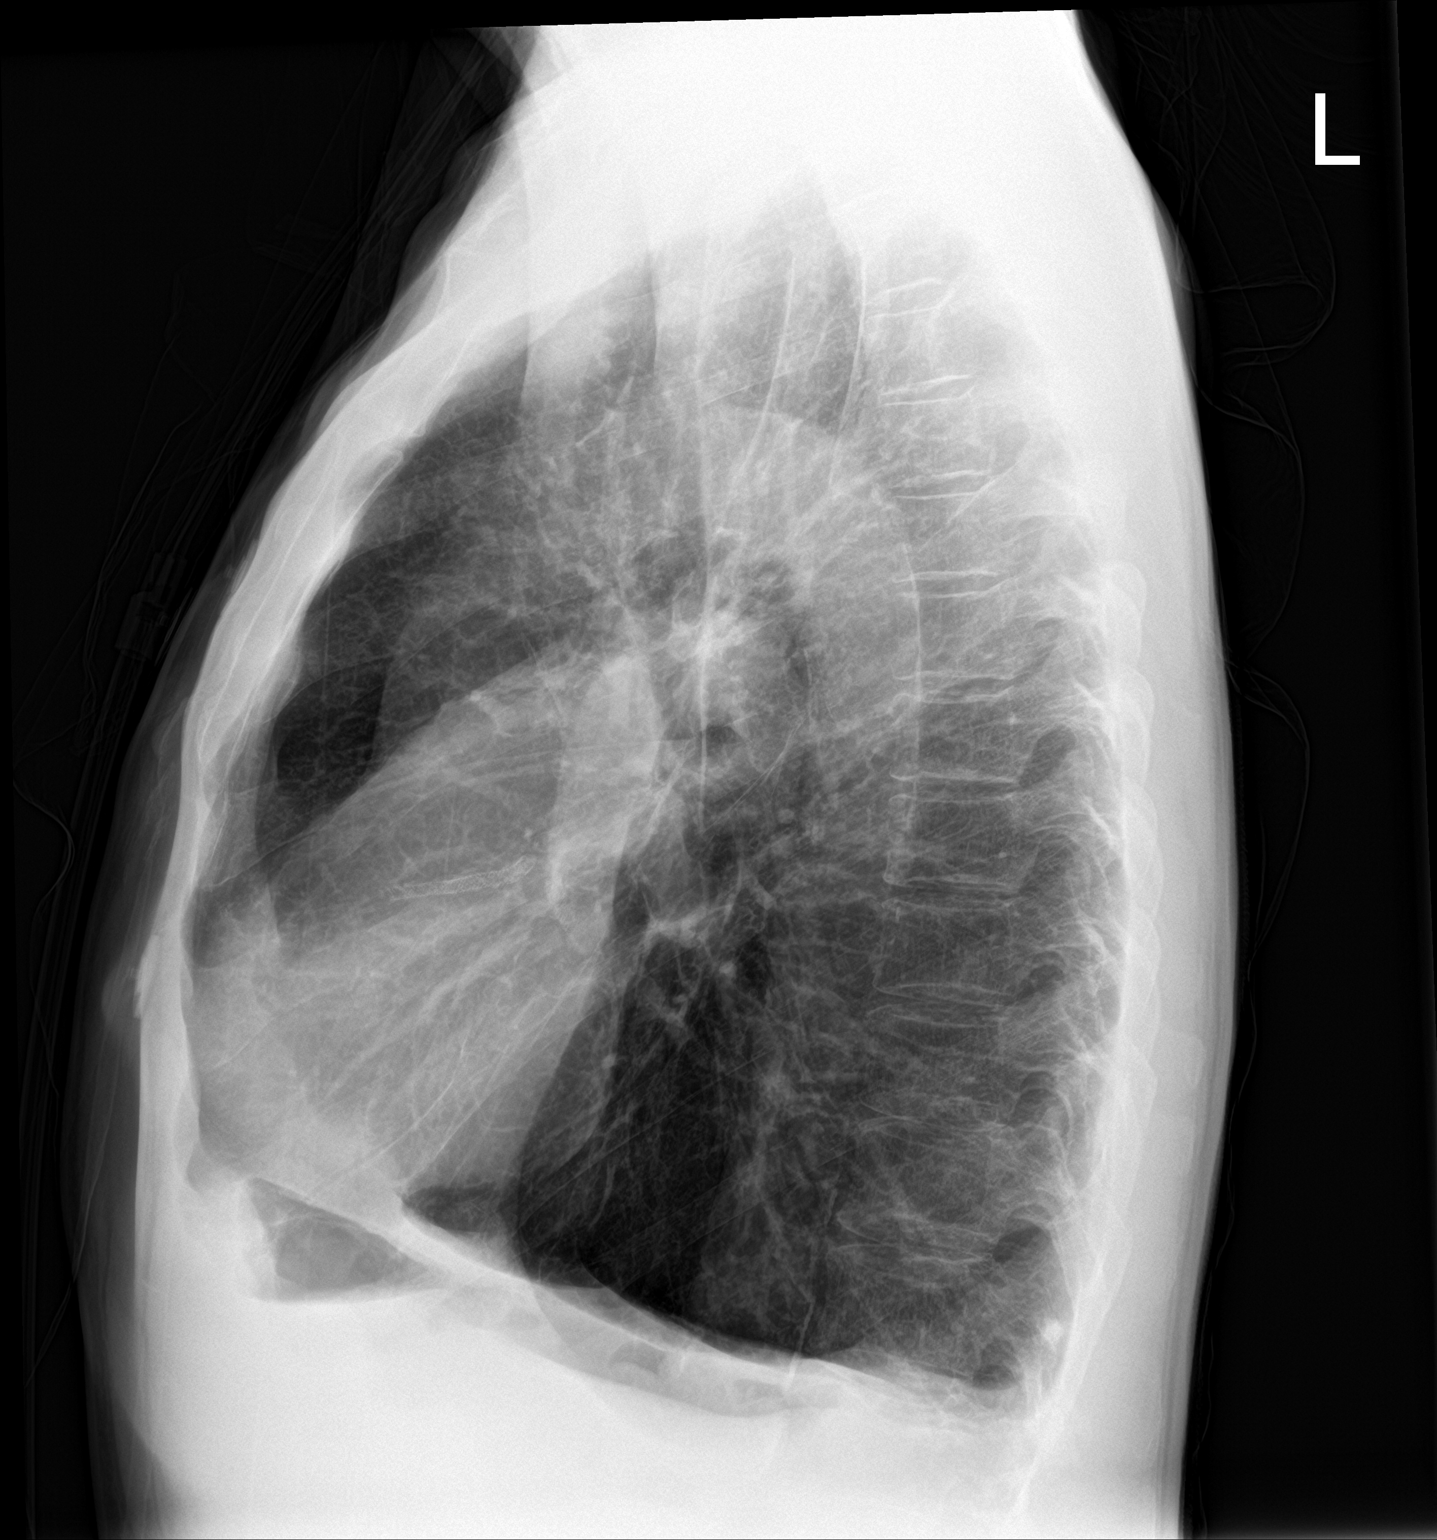

[2 of 2 positions shown; findings below may reference images not displayed]

FINDINGS: The heart size and mediastinal contours are within normal limits.
Aortic atherosclerosis. Coronary artery stents. Hyperexpanded lungs
with chronically coarsened interstitial markings. Bibasilar
scarring. No new focal airspace consolidation, pleural effusion, or
pneumothorax. The visualized skeletal structures are unremarkable.
IMPRESSION: COPD with bibasilar scarring. No new focal airspace consolidation.

## 2020-10-14 IMAGING — DX DG ABDOMEN 1V
1 series · 1 of 1 positions shown · non-contrast
Comparison: Earlier today.

CLINICAL DATA: NG tube placement.

EXAM:
ABDOMEN - 1 VIEW

[abdomen]
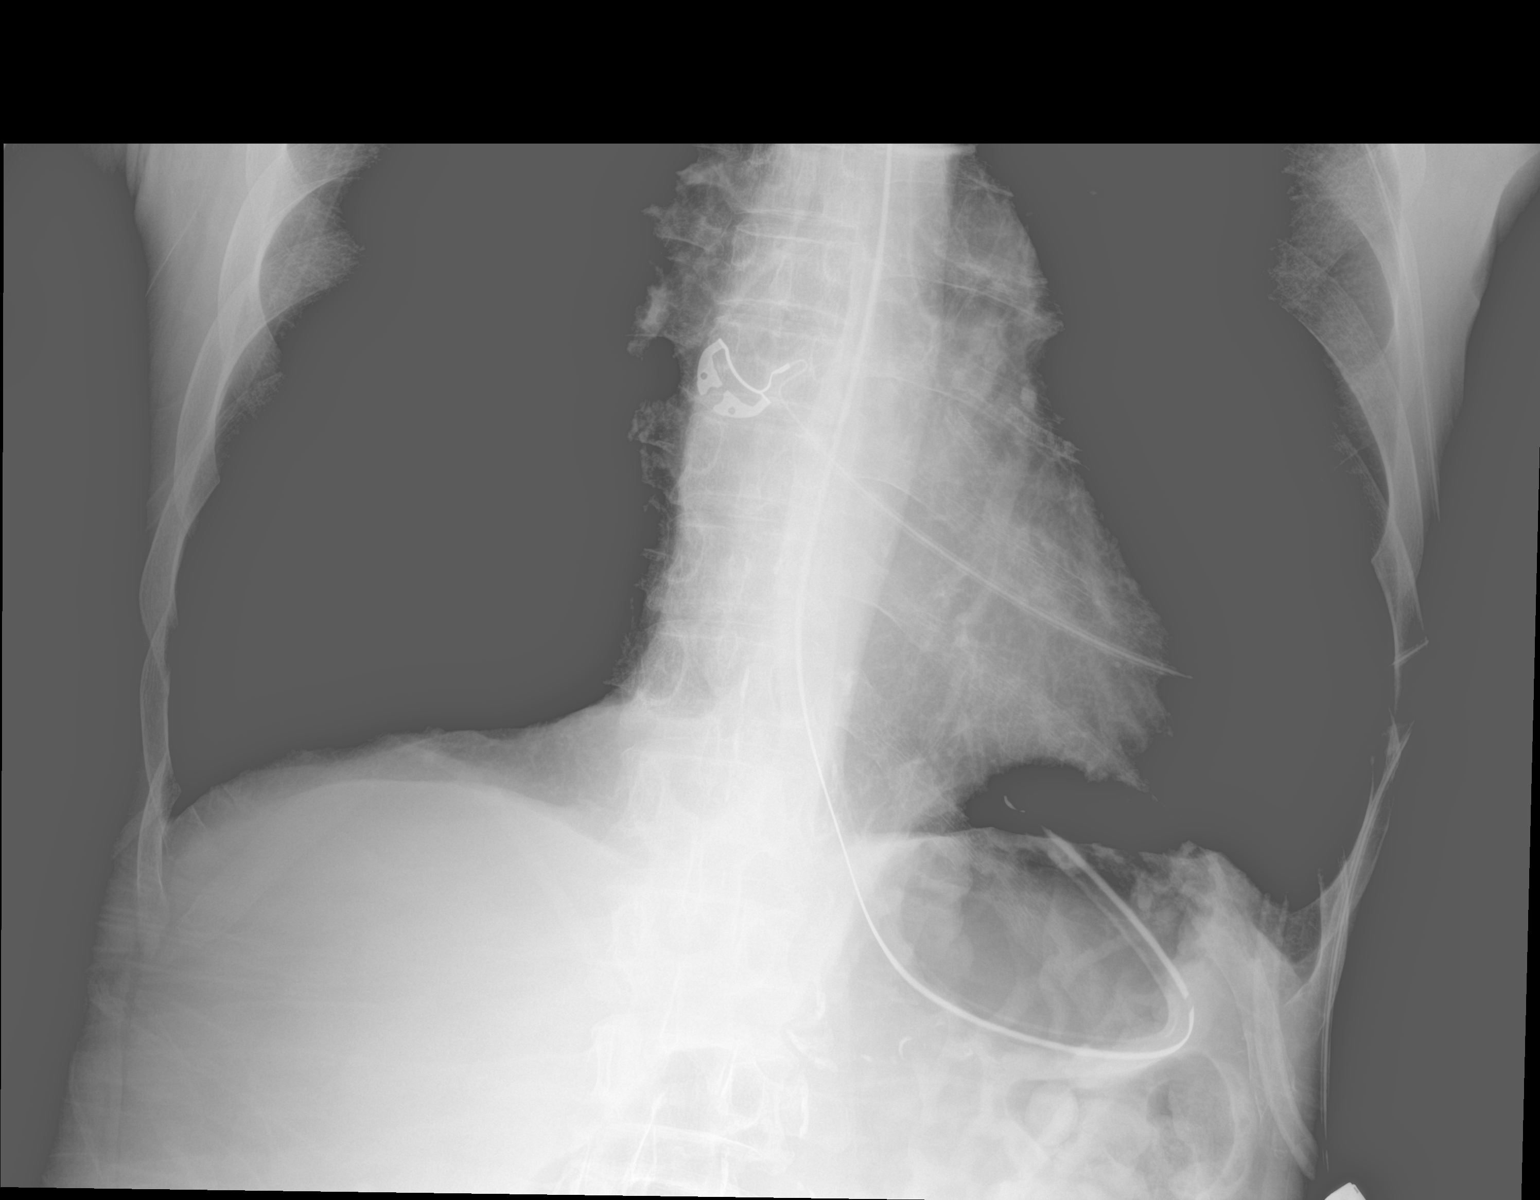

[1 of 1 positions shown; findings below may reference images not displayed]

FINDINGS: The enteric tube has been advanced with tip and side-port now below
the diaphragm in the stomach. Remainder the exam is limited by
penetration.
IMPRESSION: Tip and side port of the enteric tube below the diaphragm in the
stomach.

## 2020-10-14 IMAGING — DX DG ABD PORTABLE 1V
1 series · 1 of 1 positions shown · non-contrast
Comparison: None.

CLINICAL DATA: Replaced OG tube.

EXAM:
PORTABLE ABDOMEN - 1 VIEW

[abdomen kub]
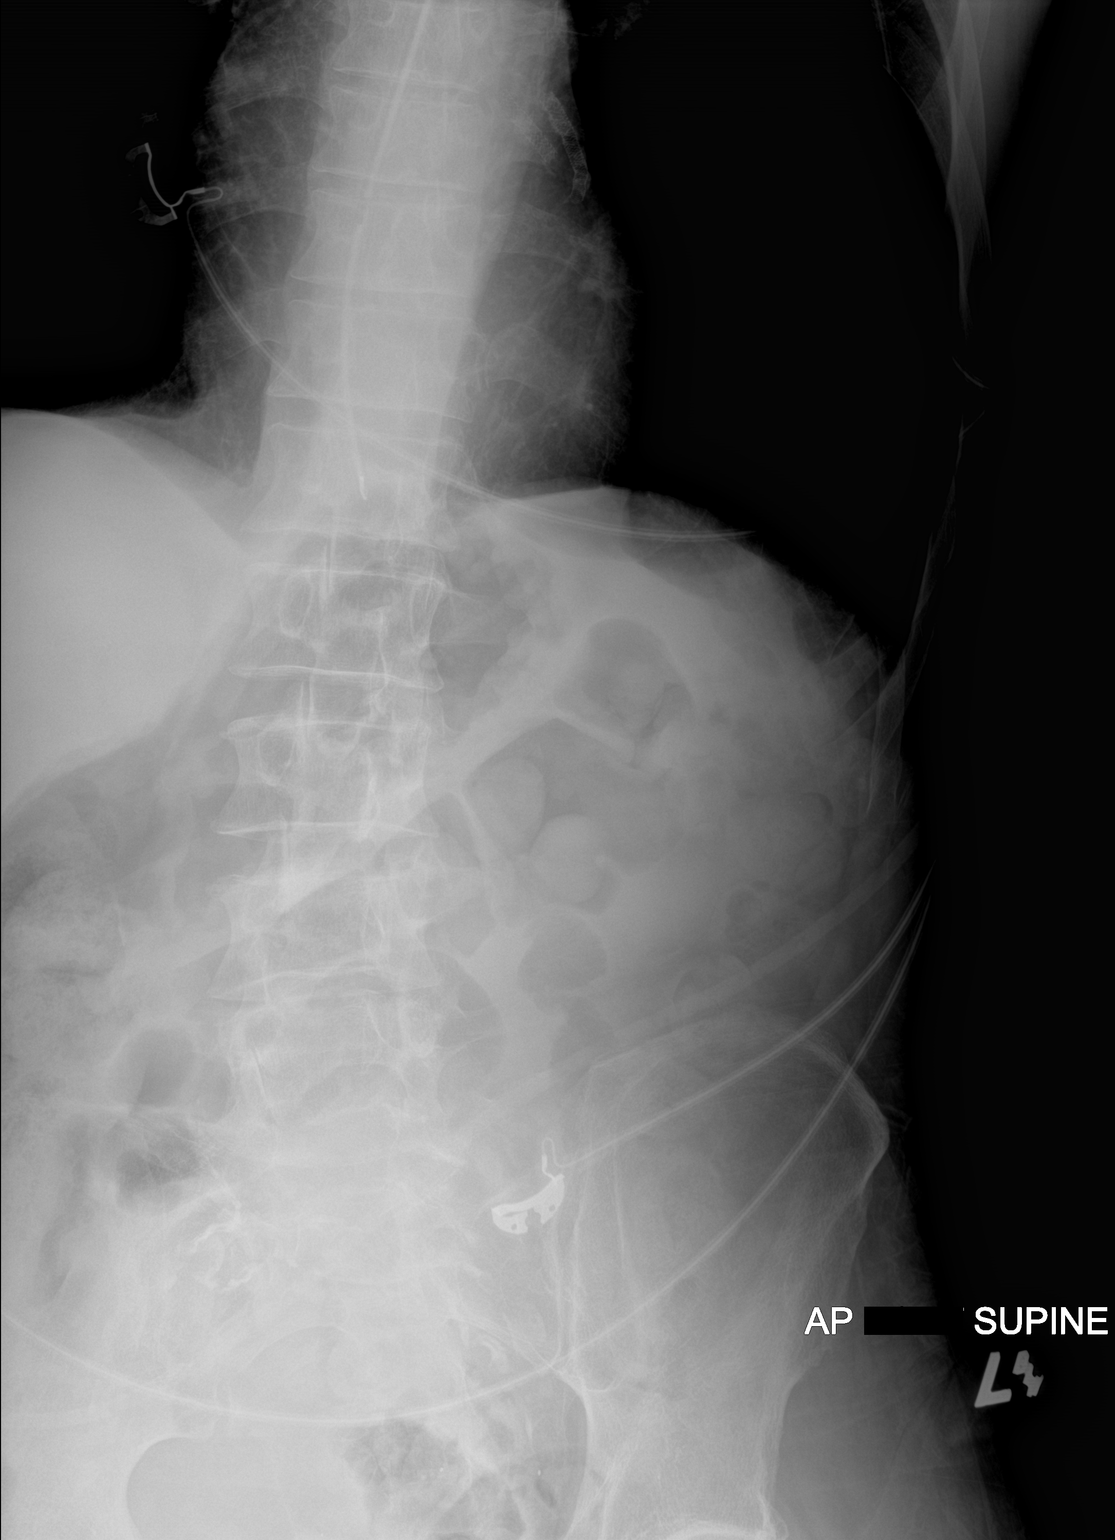

[1 of 1 positions shown; findings below may reference images not displayed]

FINDINGS: Tip of the enteric tube is in the distal esophagus, the side port is
in the mid lower esophagus. Recommend advancement of greater than 10
cm to place the side-port below the diaphragm. Moderate stool in the
included colon.
IMPRESSION: Tip of the enteric tube in the distal esophagus, the side-port in
the mid lower esophagus. Recommend advancement of greater than 10 cm
to place the side-port below the diaphragm.

## 2020-10-18 IMAGING — DX DG CHEST 1V PORT
1 series · 1 of 1 positions shown · non-contrast
Comparison: 04/22/2020 and earlier, including CTA chest 11/23/2019.

CLINICAL DATA: Acute superimposed upon chronic respiratory failure
associated with hypoxia. Current history of COPD/emphysema.

EXAM:
PORTABLE CHEST 1 VIEW

[chest]
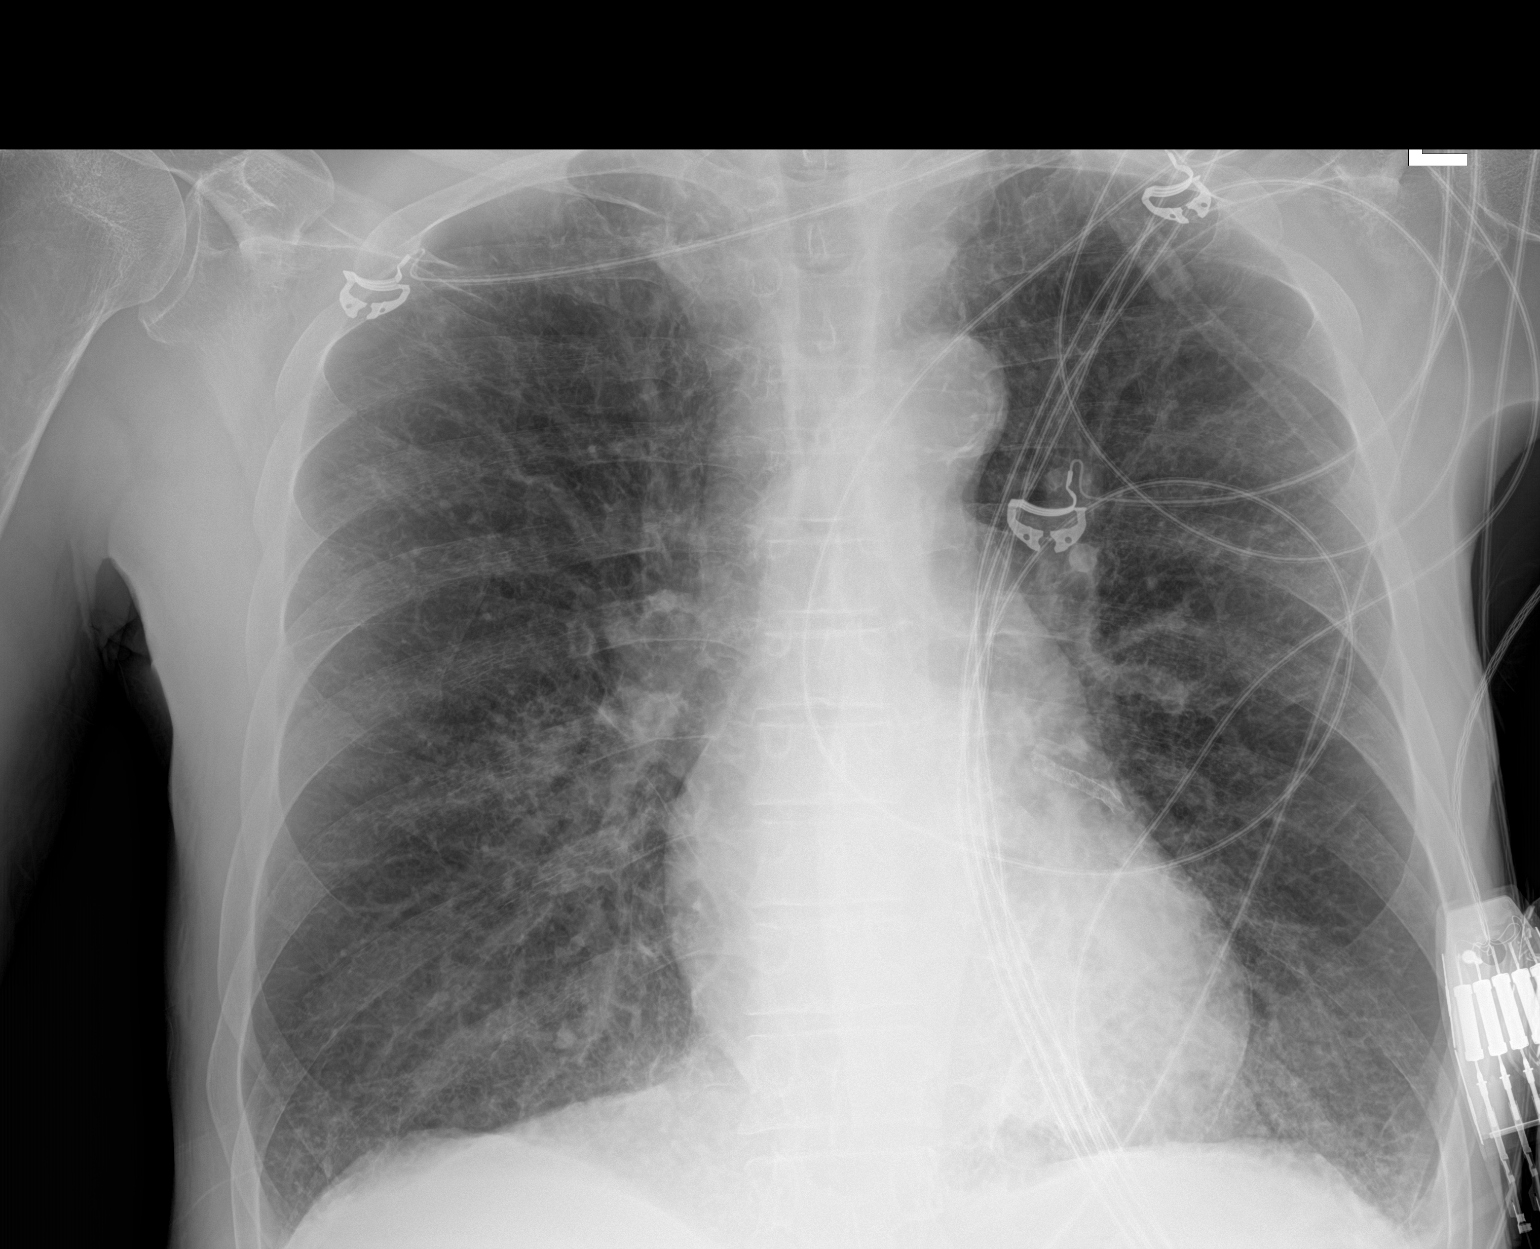

[1 of 1 positions shown; findings below may reference images not displayed]

FINDINGS: Cardiac silhouette normal in size, unchanged. Severe emphysematous
changes throughout both lungs, unchanged. The focal pneumonia in the
medial LEFT lung base, likely in the lingula, has improved since the
examination yesterday were it had a nodular appearance. Mild
airspace opacities persist. No new pulmonary parenchymal
abnormalities.
IMPRESSION: 1. Improving pneumonia in the medial LEFT lung base, likely in the
lingula, though mild pneumonia persists.
2. Stable severe COPD/emphysema.
3. No new abnormalities.

## 2020-11-15 ENCOUNTER — Telehealth: Payer: Self-pay | Admitting: Pulmonary Disease

## 2020-11-15 MED ORDER — BREZTRI AEROSPHERE 160-9-4.8 MCG/ACT IN AERO: 2.0000 | INHALATION_SPRAY | Freq: Two times a day (BID) | RESPIRATORY_TRACT | 3 refills | Status: DC

## 2020-11-15 NOTE — Telephone Encounter (Signed)
Spoke with the pt  He is needing refill on his Breztri- 90 day supply needs to be faxed to [facility] [phone]  I have printed rx and since VS not in [place] today, had MW sign and have faxed  Nothing further needed

## 2020-11-26 NOTE — Progress Notes (Signed)
Chief Complaint  Patient presents with   Follow-up    CAD   History of Present Illness: 65 yo male with history of ongoing tobacco abuse, CAD , HTN, hyperlipidemia, chronic diastolic CHF and COPD who is here today for cardiac follow up. He was admitted to [facility] December 2011 with a NSTEMI. Cardiac cath 09/17/10 and was found to have a severe stenosis of the LAD which was treated with a drug eluting stent. He was also found to have moderate disease in the RCA (70%) and Circumflex (70%) managed medically. Cardiac cath June 2012 with stable CAD. He  had 3 admissions in July 2013 with severe epistaxis causing hemorrhagic shock with tachycardia and drop in blood pressure. This was treated with packing and transfusion. His Plavix was stopped. He was admitted January 2016 with unstable angina. Cardiac cath January 2016 with severe mid LAD stenosis, severe mid Circumflex stenosis. I placed a drug eluting stent in the mid LAD and a drug eluting stent in the mid Circumflex. LV function normal. I saw him in May 2016 and he c/o fatigue and dyspnea. His Coreg was stopped and he had resolution of his symptoms. He missed follow up appointments in 2017-2020 due to lack of insurance and cost. I saw him February 2020 with c/o dyspnea and lower extremity edema. He had been off of all medications due to losing his job and having no money. He continued to smoke at least one pack per day and was drinking up to a 6 pack of beer per day. He described dyspnea with minimal exertion, even talking and could not lay flat. He had been seen in the Health.Department in [facility] and workup has been underway for dyspnea. Mention of PFTs but no way to pay for it. I felt that his dyspnea was a combination of underlying lung disease and volume overload. He refused admission or an echo. BNP and BMET were normal. I started Lasix 40 mg daily. I saw him by virtual visit 12/23/18 and he reported great improvement in his LE  edema and dyspnea. He has re-established in the Pulmonary clinic with Dr. Sherene Sires and was started on an inhaler.  I saw him in the office in February 2021 and he c/o worsened dyspnea, cough and was found to have low oxygen saturations. He was seen in the pulmonary office 11/16/19 and felt to have a COPD exacerbation. He was treated with steroids and antibiotics per the pulmonary office with improvement in symptoms. He was admitted to [facility] July 2021 with acute respiratory failure secondary to COPD exacerbation and community acquired pneumonia and was intubated. He was euvolemic on admission per notes but developed LE edema during his hospital stay. He was discharged on Lasix 40 mg po BID. Echo 04/19/20 with LVEF=65-70%. No valve disease. He has stopped smoking. He is on supplemental O2 24 hours per day now.   He is here today for follow up. The patient denies any chest pain, palpitations, lower extremity edema, orthopnea, PND, dizziness, near syncope or syncope. He is doing well overall. He has dyspnea with minimal exertion and continues to wear supplemental O2 24 hours per day.    Primary Care Physician: Jacquelin Hawking, PA-C  Past Medical History:  Diagnosis Date   Alcohol abuse 12/27/2010   Allergic rhinitis, cause unspecified 12/27/2010   Anal warts 04/11/2012   CAD, NATIVE VESSEL 10/04/2010   COPD (chronic obstructive pulmonary disease) (HCC)    "a touch" (09/28/2013)   History of blood transfusion  03/2012   related to nose bleed   HYPERLIPIDEMIA-MIXED 10/04/2010   HYPERTENSION, BENIGN 10/04/2010   Myocardial infarction (HCC) 09/16/2010   Pneumonia    "when I was a kid"   TOBACCO ABUSE 10/04/2010    Past Surgical History:  Procedure Laterality Date   CARDIAC CATHETERIZATION  ~ 2012   CORONARY ANGIOPLASTY WITH STENT PLACEMENT  09/17/2010; 09/28/2014   "1; 2"   FINGER SURGERY Left    "almost cut off" tip of 2nd digit   LEFT HEART CATHETERIZATION WITH CORONARY ANGIOGRAM N/A 09/28/2014    Procedure: LEFT HEART CATHETERIZATION WITH CORONARY ANGIOGRAM;  Surgeon: Kathleene Hazel, MD;  Location: [facility] CATH LAB;  Service: Cardiovascular;  Laterality: N/A;   NASAL ENDOSCOPY WITH EPISTAXIS CONTROL Bilateral 03/2012   VASECTOMY      Current Outpatient Medications  Medication Sig Dispense Refill   albuterol (PROAIR HFA) 108 (90 Base) MCG/ACT inhaler 2 puffs every 4 hours as needed only  if your can't catch your breath 18 g 11   aspirin EC 81 MG tablet Take 81 mg by mouth daily.     Budeson-Glycopyrrol-Formoterol (BREZTRI AEROSPHERE) 160-9-4.8 MCG/ACT AERO Inhale 2 puffs into the lungs 2 (two) times daily. 32.37 g 3   Multiple Vitamin (MULTIVITAMIN WITH MINERALS) TABS tablet Take 1 tablet by mouth daily.     OXYGEN Inhale 2-3 L into the lungs.     atorvastatin (LIPITOR) 40 MG tablet Take 1 tablet (40 mg total) by mouth daily. 90 tablet 3   furosemide (LASIX) 40 MG tablet Take 1 tablet (40 mg total) by mouth daily. 90 tablet 3   nitroGLYCERIN (NITROSTAT) 0.4 MG SL tablet Place 1 tablet (0.4 mg total) under the tongue every 5 (five) minutes as needed for chest pain. 25 tablet 6   No current facility-administered medications for this visit.    Allergies  Allergen Reactions   Penicillins Other (See Comments)    Passes out    Social History   Socioeconomic History   Marital status: Single    Spouse name: Not on file   Number of children: Not on file   Years of education: Not on file   Highest education level: Not on file  Occupational History   Occupation: Lobbyist maintenance work     Employer: [employer]    Comment: Ronette Deter  Tobacco Use   Smoking status: Former Smoker    Packs/day: 2.00    Years: 50.00    Pack years: 100.00    Types: Cigarettes    Quit date: 04/18/2020    Years since quitting: 0.6   Smokeless tobacco: Never Used   Tobacco comment: 2 plus packs per day. He has a 100 + pack - year history of tobacco abuse currently. Former 4  ppd for 25 years.  Vaping Use   Vaping Use: Never used  Substance and Sexual Activity   Alcohol use: Yes    Alcohol/week: 144.0 standard drinks    Types: 144 Cans of beer per week    Comment:  history 12 pack of beer per night".  2021- drinks 3 beer/day   Drug use: No   Sexual activity: Yes    Birth control/protection: None  Other Topics Concern   Not on file  Social History Narrative   The patient lives in [place] with his girlfriend. 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   Social Determinants of Health   Financial Resource Strain: Not on file  Food Insecurity: Not on file  Transportation Needs: Not on file  Physical Activity: Not on file  Stress: Not on file  Social Connections: Not on file  Intimate Partner Violence: Not on file    Family History  Problem Relation Age of Onset   Brain cancer Mother    Heart disease Father     Review of Systems:  As stated in the HPI and otherwise negative.   BP 136/80    Pulse 69    Ht 5\' 9"  (1.753 m)    Wt 163 lb (73.9 kg)    SpO2 92%    BMI 24.07 kg/m   Physical Examination:  General: Well developed, well nourished, NAD  HEENT: OP clear, mucus membranes moist  SKIN: warm, dry. No rashes. Neuro: No focal deficits  Musculoskeletal: Muscle strength 5/5 all ext  Psychiatric: Mood and affect normal  Neck: No JVD, no carotid bruits, no thyromegaly, no lymphadenopathy.  Lungs:Clear bilaterally, no wheezes, rhonci, crackles Cardiovascular: Regular rate and rhythm. No murmurs, gallops or rubs. Abdomen:Soft. Bowel sounds present. Non-tender.  Extremities: No lower extremity edema. Pulses are 2 + in the bilateral DP/PT.  Echo 04/19/20: 1. Left ventricular ejection fraction, by estimation, is 65 to 70%. The  left ventricle has normal function. The left ventricle has no regional  wall motion abnormalities. Left ventricular diastolic parameters were  normal.  2. Right ventricular systolic  function is normal. The right ventricular  size is normal. There is normal pulmonary artery systolic pressure.  3. The mitral valve is normal in structure. No evidence of mitral valve  regurgitation. No evidence of mitral stenosis.  4. The aortic valve is tricuspid. Aortic valve regurgitation is not  visualized. No aortic stenosis is present.  5. The inferior vena cava is normal in size with greater than 50%  respiratory variability, suggesting right atrial pressure of 3 mmHg.   Cardiac cath January 2016: PCI Note: He was given an additional 6000 units of IV heparin. He was given Plavix 600 mg po x 1. When the ACT was over 200, I engaged the left main with a XB LAD 3.5 guiding catheter.  Lesion #1: (mid Circumflex): I advanced a Cougar IC wire down the Circumflex. A 2.5 x 12 mm balloon was used to pre-dilate the severe stenosis in the mid Circumflex artery. I then carefully positioned and deployed a 3.5 x 20 mm Promus Premier DES in the mid Circumflex. The stent was post-dilated with a 3.75 x 15 mm [place] balloon x 1. The stenosis was taken from 99% down to 0%.  Lesion #2 (mid LAD): I then pulled the wire back and advanced down the LAD. A 2.5 x 12 mm balloon was used to pre-dilate the mid LAD stenosis. I then carefully positioned a 2.75 x 16 mm Promus Premier DES in the mid LAD, overlapping with the old stent on the distal edge of the old stent. The stent was post-dilated with a 3.0 x 12 mm Addy balloon x 1. The stenosis was taken from 80% down to 0%.  The sheath was removed from the right radial artery and a Terumo hemostasis band was applied at the arteriotomy site on the right wrist. There were no immediate complications. The patient was taken to the recovery area in stable condition.   Hemodynamic Findings: Central aortic pressure: 99/68 Left ventricular pressure: 99/5/12 Angiographic Findings: Left main: No obstructive disease.  Left Anterior Descending Artery: Large caliber vessel that  courses to the apex. There is mild plaque  in the proximal vessel.The mid vessel has a patent stent without restenosis. Just beyond the stented segment in the mid LAD there is a focal 80% stenosis. This is seen in several views and is felt to be flow limiting.  Circumflex Artery: Large caliber vessel with small first obtuse marginal branch and large second obtuse marginal branch. The mid AV groove Circumflex has a 99% stenosis. The superior branch of OM2 has a focal 40% stenosis, unchanged from last cath.  Right Coronary Artery: Large dominant vessel with diffuse 50% proximal stenosis, unchanged from last cath.  Left Ventricular Angiogram: LVEF=60-65%.   EKG:  EKG is not ordered today. The ekg ordered today demonstrates   Recent Labs: 04/18/2020: B Natriuretic Peptide 45.6 04/19/2020: TSH 0.688 04/23/2020: Hemoglobin 12.0; Magnesium 1.6; Platelets 112 10/10/2020: ALT 23; BUN 13; Creatinine, Ser 1.05; Potassium 3.4; Sodium 140   Lipid Panel    Component Value Date/Time   CHOL 174 10/10/2020 0845   CHOL 139 07/13/2019 0733   TRIG 60 10/10/2020 0845   TRIG 82 08/15/2006 0929   HDL 76 10/10/2020 0845   HDL 64 07/13/2019 0733   CHOLHDL 2.3 10/10/2020 0845   VLDL 12 10/10/2020 0845   LDLCALC 86 10/10/2020 0845   LDLCALC 61 07/13/2019 0733     Wt Readings from Last 3 Encounters:  11/27/20 163 lb (73.9 kg)  10/11/20 152 lb (68.9 kg)  07/05/20 149 lb 3.2 oz (67.7 kg)     Other studies Reviewed: Additional studies/ records that were reviewed today include: . Review of the above records demonstrates:    Assessment and Plan:   1. CAD without angina: No chest pain. LV function normal by echo July 2021. He does not tolerate beta blockers due to fatigue. Continue ASA and statin.     2. Tobacco abuse, in remission: He has stopped smoking.   3. HTN: BP is normal. No changes  4. Hyperlipidemia: LDL near goal in January 2022. Continue statin.   5. Chronic diastolic CHF: LV systolic  function normal by echo July 2021. No volume overload on exam. Continue Lasix  6. Dyspnea/COPD: He is followed in the pulmonary office. Now on continuous supplemental O2.   Current medicines are reviewed at length with the patient today.  The patient does not have concerns regarding medicines.  The following changes have been made:  no change  Labs/ tests ordered today include:   No orders of the defined types were placed in this encounter.   Disposition:   FU with me in 6 months  Signed, Verne Carrow, MD 11/27/2020 8:37 AM    [facility] [address] [place], [place], [place]  [address] Phone: [phone]; Fax: [phone]

## 2020-11-27 ENCOUNTER — Encounter: Payer: Self-pay | Admitting: Cardiovascular Disease

## 2020-11-27 ENCOUNTER — Ambulatory Visit (INDEPENDENT_AMBULATORY_CARE_PROVIDER_SITE_OTHER): Payer: No Typology Code available for payment source | Admitting: Cardiovascular Disease

## 2020-11-27 ENCOUNTER — Other Ambulatory Visit: Payer: Self-pay

## 2020-11-27 VITALS — BP 136/80 | HR 69 | Ht 69.0 in | Wt 163.0 lb

## 2020-11-27 DIAGNOSIS — I10 Essential (primary) hypertension: Secondary | ICD-10-CM

## 2020-11-27 DIAGNOSIS — I5032 Chronic diastolic (congestive) heart failure: Secondary | ICD-10-CM

## 2020-11-27 DIAGNOSIS — I2510 Atherosclerotic heart disease of native coronary artery without angina pectoris: Secondary | ICD-10-CM

## 2020-11-27 DIAGNOSIS — E7800 Pure hypercholesterolemia, unspecified: Secondary | ICD-10-CM

## 2020-11-27 MED ORDER — ATORVASTATIN CALCIUM 40 MG PO TABS: 40.0000 mg | ORAL_TABLET | Freq: Every day | ORAL | 3 refills | Status: DC

## 2020-11-27 MED ORDER — FUROSEMIDE 40 MG PO TABS: 40.0000 mg | ORAL_TABLET | Freq: Every day | ORAL | 3 refills | Status: DC

## 2020-11-27 MED ORDER — NITROGLYCERIN 0.4 MG SL SUBL: 0.4000 mg | SUBLINGUAL_TABLET | SUBLINGUAL | 6 refills | Status: DC | PRN

## 2020-11-27 NOTE — Patient Instructions (Signed)

## 2020-11-28 ENCOUNTER — Other Ambulatory Visit: Payer: Self-pay | Admitting: Internal Medicine

## 2020-11-28 MED ORDER — BREZTRI AEROSPHERE 160-9-4.8 MCG/ACT IN AERO: 2.0000 | INHALATION_SPRAY | Freq: Two times a day (BID) | RESPIRATORY_TRACT | 3 refills | Status: DC

## 2020-12-18 ENCOUNTER — Ambulatory Visit: Payer: No Typology Code available for payment source | Admitting: Acute Care

## 2020-12-18 ENCOUNTER — Ambulatory Visit (INDEPENDENT_AMBULATORY_CARE_PROVIDER_SITE_OTHER): Payer: No Typology Code available for payment source | Admitting: Primary Care

## 2020-12-18 ENCOUNTER — Telehealth: Payer: Self-pay | Admitting: Pulmonary Disease

## 2020-12-18 ENCOUNTER — Encounter: Payer: Self-pay | Admitting: Primary Care

## 2020-12-18 DIAGNOSIS — J441 Chronic obstructive pulmonary disease with (acute) exacerbation: Secondary | ICD-10-CM

## 2020-12-18 DIAGNOSIS — R911 Solitary pulmonary nodule: Secondary | ICD-10-CM

## 2020-12-18 MED ORDER — AZITHROMYCIN 250 MG PO TABS: ORAL_TABLET | ORAL | 0 refills | Status: DC

## 2020-12-18 MED ORDER — PREDNISONE 10 MG PO TABS: ORAL_TABLET | ORAL | 0 refills | Status: DC

## 2020-12-18 NOTE — Telephone Encounter (Signed)
Spoke with the pt  He is c/o increased SOB, "rattling in chest", cough with greyish green sputum-onset was 12/15/20  He had temp of 101 last night but is afebrile this am  He is compliant with his Markus Daft and has been using his albuterol inhaler daily since symptoms started  He is fully vaxxed against covid including his booster  Asking for something to be called in  Please advise thanks!

## 2020-12-18 NOTE — Progress Notes (Signed)
Reviewed and agree with assessment/plan.   Coralyn Helling, MD [facility] Pulmonary/Critical Care 12/18/2020, 3:32 PM Pager:  [phone]

## 2020-12-18 NOTE — Telephone Encounter (Signed)
Will need to get set up for video visit.

## 2020-12-18 NOTE — Telephone Encounter (Signed)
Called and spoke with patient. He is ok with doing a televisit this afternoon. Was able to find a 4pm opening on SG's schedule since Beth's schedule is blocked.   Nothing further needed at time of call.

## 2020-12-18 NOTE — Progress Notes (Signed)
Virtual Visit via Telephone Note  I connected with John [name] on 12/18/20 at  2:30 PM EDT by telephone and verified that I am speaking with the correct person using two identifiers.  Location: Patient: Home Provider: Office   I discussed the limitations, risks, security and privacy concerns of performing an evaluation and management service by telephone and the availability of in person appointments. I also discussed with the patient that there may be a patient responsible charge related to this service. The patient expressed understanding and agreed to proceed.   History of Present Illness: 65 year old male, former smoker. PMH significant for COPD GOLD II, chronic respiratory failure O2 dependent, HTN, unstable angina, CAD, alcohol abuse, hyperlipidemia. Patient of Dr. Craige Cotta, last seen on 07/05/20.    12/18/2020  Patient reports increased shortness of breath, chest congestion and cough x 3 days. Cough is productive with grey-green mucus. He had a temperature Friday night 3/25 and Saturday night, Tmax 101.7. He was afebrile this morning but then temp came back 100.3. He took a home covid dest on 3/26 that came back negative. He is compliant with Breztri two puffs twice daily. He has been using his albuterol inahler daily since he developing symptoms. He wears 2L oxygen. He is on disability. He works on cars as a hobby. He is vaccinated for covid-19 including booster and influenxa vaccine. No known sick contacts or covid exposures  Due for follow-up CT chest in March 2022 to monitor lung nodule.    Observations/Objective:  - Congested cough. Able to speak in full sentences without dyspnea  - Temp 100.3 12/18/2020  Pulmonary testing:  PFT 12/09/19 >> FEV1 .059 (16%), FEV1% 37, DLCO 27%  A1AT 03/09/20 >> 110, MZ  ABG 05/12/20 >> pH 7.4, PCO2 54, PO2 73  Imaging:  CT angio chest 11/23/19 >> moderate centrilobular emphysema, mild infrahilar BTX, 6 mm nodule LUL  Assessment and  Plan:  COPD exacerbation: - Congested cough x 3 days with associated sob and low grade fever. Home covid test negative on 12/16/20. Possible CAP vs Covid infection.  - Continue Breztri two puffs twice daily; prn albuterol hfa 2 puffs every 6 hours  - Advised patient take Mucinex 600mg  twice daily and Tylenol 650mg  q 6 hours for fever/chills  - Sending in prescription for Azithromycin 500mg  x 1 day; then 250mg  x 4 days; Prednisone 20mg  x 5 days - Recommend patient get PCR covid testing on 12/20/20 - Notify if symptoms do not improve in 3-5 days or worsen; would need CXR  Follow Up Instructions:   - As needed if symptoms do not improve or worsen  I discussed the assessment and treatment plan with the patient. The patient was provided an opportunity to ask questions and all were answered. The patient agreed with the plan and demonstrated an understanding of the instructions.   The patient was advised to call back or seek an in-person evaluation if the symptoms worsen or if the condition fails to improve as anticipated.  I provided 25 minutes of non-face-to-face time during this encounter.   , NP

## 2020-12-18 NOTE — Patient Instructions (Signed)
Continue Breztri two puffs twice daily; prn albuterol hfa 2 puffs every 6 hours  Take Mucinex 600mg  twice daily and Tylenol 650mg  q 6 hours for fever/chills  Sending in prescription for Azithromycin 500mg  x 1 day; then 250mg  x 4 days; Prednisone 20mg  x 5 days Recommend patient get PCR covid testing on 12/20/20 Notify if symptoms do not improve in 3-5 days or worsen; would need CXR  Due for follow-up CT chest in March/April to monitor previous lung nodule seen on CTA in March 2021

## 2020-12-20 ENCOUNTER — Emergency Department (HOSPITAL_COMMUNITY)
Admission: EM | Admit: 2020-12-20 | Discharge: 2020-12-21 | Disposition: A | Discharge status: Home / Self Care | Payer: No Typology Code available for payment source | Attending: Emergency Medicine | Admitting: Emergency Medicine

## 2020-12-20 ENCOUNTER — Other Ambulatory Visit: Payer: Self-pay

## 2020-12-20 ENCOUNTER — Encounter (HOSPITAL_COMMUNITY): Payer: Self-pay | Admitting: Emergency Medicine

## 2020-12-20 ENCOUNTER — Emergency Department (HOSPITAL_COMMUNITY): Payer: No Typology Code available for payment source

## 2020-12-20 DIAGNOSIS — Z20822 Contact with and (suspected) exposure to covid-19: Secondary | ICD-10-CM | POA: Insufficient documentation

## 2020-12-20 DIAGNOSIS — Z955 Presence of coronary angioplasty implant and graft: Secondary | ICD-10-CM | POA: Insufficient documentation

## 2020-12-20 DIAGNOSIS — Z87891 Personal history of nicotine dependence: Secondary | ICD-10-CM | POA: Insufficient documentation

## 2020-12-20 DIAGNOSIS — I2510 Atherosclerotic heart disease of native coronary artery without angina pectoris: Secondary | ICD-10-CM | POA: Insufficient documentation

## 2020-12-20 DIAGNOSIS — J441 Chronic obstructive pulmonary disease with (acute) exacerbation: Secondary | ICD-10-CM

## 2020-12-20 DIAGNOSIS — Z79899 Other long term (current) drug therapy: Secondary | ICD-10-CM | POA: Insufficient documentation

## 2020-12-20 DIAGNOSIS — R509 Fever, unspecified: Secondary | ICD-10-CM | POA: Insufficient documentation

## 2020-12-20 DIAGNOSIS — I10 Essential (primary) hypertension: Secondary | ICD-10-CM | POA: Insufficient documentation

## 2020-12-20 DIAGNOSIS — Z7982 Long term (current) use of aspirin: Secondary | ICD-10-CM | POA: Insufficient documentation

## 2020-12-20 DIAGNOSIS — D7589 Other specified diseases of blood and blood-forming organs: Secondary | ICD-10-CM | POA: Insufficient documentation

## 2020-12-20 MED ORDER — ALBUTEROL SULFATE HFA 108 (90 BASE) MCG/ACT IN AERS
6.0000 | INHALATION_SPRAY | Freq: Once | RESPIRATORY_TRACT | Status: AC
  Administered 2020-12-20: 6 via RESPIRATORY_TRACT
  Filled 2020-12-20: qty 6.7

## 2020-12-20 NOTE — ED Provider Notes (Signed)
[facility] EMERGENCY DEPARTMENT Provider Note   CSN: [id number] Arrival date & time: 12/20/20  2127   History Chief Complaint  Patient presents with  . Shortness of Breath    John [name] is a 65 y.o. male.  The history is provided by the patient.  Shortness of Breath He has history of hypertension, hyperlipidemia, coronary artery disease, COPD and comes in because of cough and fever for the last 5 days.  Fever has been as high as 102.  Cough is productive of green sputum.  There has been associated dyspnea but no nausea or vomiting.  He denies chills or sweats.  He denies any sick contacts.  He has been vaccinated against COVID-19, including booster.  He has been using his home inhaler which gives slight, temporary relief.  He did have a virtual visit with his pulmonologist who started him on prednisone and azithromycin 2 days ago.  He states he feels that he is raising sputum better up, but today he noted his blood pressure had gone up and he has noted a tight feeling in his chest.  He came in by ambulance who gave him additional methylprednisolone.  Past Medical History:  Diagnosis Date  . Alcohol abuse 12/27/2010  . Allergic rhinitis, cause unspecified 12/27/2010  . Anal warts 04/11/2012  . CAD, NATIVE VESSEL 10/04/2010  . COPD (chronic obstructive pulmonary disease) (HCC)    "a touch" (09/28/2013)  . History of blood transfusion 03/2012   related to nose bleed  . HYPERLIPIDEMIA-MIXED 10/04/2010  . HYPERTENSION, BENIGN 10/04/2010  . Myocardial infarction (HCC) 09/16/2010  . Pneumonia    "when I was a kid"  . TOBACCO ABUSE 10/04/2010    Patient Active Problem List   Diagnosis Date Noted  . Does not have health insurance 05/03/2020  . Complex care coordination 05/03/2020  . Protein-calorie malnutrition, severe 04/19/2020  . Acute respiratory failure with hypercapnia (HCC) 04/18/2020  . Chronic respiratory failure with hypoxia and hypercapnia (HCC) 12/09/2019  . Chronic  respiratory failure with hypoxia (HCC) 11/23/2019  . Medication management 11/16/2019  . Sleeping difficulties 11/16/2019  . COPD MZ/ GOLD II spirometry  but 02 dep/ hypercarbic as of 11/2019 12/26/2018  . Exercise hypoxemia 12/26/2018  . Unstable angina (HCC) 09/27/2014  . Anal warts 04/11/2012  . Stented coronary artery   . Hemorrhagic shock (HCC) 03/30/2012  . Epistaxis 03/30/2012  . Dyspnea 01/02/2011  . Preventative health care 12/27/2010  . Alcohol abuse 12/27/2010  . Allergic rhinitis, cause unspecified 12/27/2010  . HYPERLIPIDEMIA-MIXED 10/04/2010  . Former smoker 10/04/2010  . HYPERTENSION, BENIGN 10/04/2010  . CAD, NATIVE VESSEL 10/04/2010    Past Surgical History:  Procedure Laterality Date  . CARDIAC CATHETERIZATION  ~ 2012  . CORONARY ANGIOPLASTY WITH STENT PLACEMENT  09/17/2010; 09/28/2014   "1; 2"  . FINGER SURGERY Left    "almost cut off" tip of 2nd digit  . LEFT HEART CATHETERIZATION WITH CORONARY ANGIOGRAM N/A 09/28/2014   Procedure: LEFT HEART CATHETERIZATION WITH CORONARY ANGIOGRAM;  Surgeon: Kathleene Hazel, MD;  Location: [facility] CATH LAB;  Service: Cardiovascular;  Laterality: N/A;  . NASAL ENDOSCOPY WITH EPISTAXIS CONTROL Bilateral 03/2012  . VASECTOMY         Family History  Problem Relation Age of Onset  . Brain cancer Mother   . Heart disease Father     Social History   Tobacco Use  . Smoking status: Former Smoker    Packs/day: 2.00    Years: 50.00  Pack years: 100.00    Types: Cigarettes    Start date: 24    Quit date: 04/18/2020    Years since quitting: 0.6  . Smokeless tobacco: Never Used  . Tobacco comment: 2 plus packs per day. He has a 100 + pack - year history of tobacco abuse currently. Former 4 ppd for 25 years.  Vaping Use  . Vaping Use: Never used  Substance Use Topics  . Alcohol use: Yes    Alcohol/week: 144.0 standard drinks    Types: 144 Cans of beer per week    Comment:  history 12 pack of beer per night".  2021-  drinks 3 beer/day  . Drug use: No    Home Medications Prior to Admission medications   Medication Sig Start Date End Date Taking? Authorizing Provider  albuterol (PROAIR HFA) 108 (90 Base) MCG/ACT inhaler 2 puffs every 4 hours as needed only  if your can't catch your breath 03/09/20   Nyoka Cowden, MD  aspirin EC 81 MG tablet Take 81 mg by mouth daily.    [provider]  atorvastatin (LIPITOR) 40 MG tablet Take 1 tablet (40 mg total) by mouth daily. 11/27/20   Kathleene Hazel, MD  azithromycin (ZITHROMAX) 250 MG tablet Zpack taper as directed 12/18/20   Glenford Bayley, NP  Budeson-Glycopyrrol-Formoterol (BREZTRI AEROSPHERE) 160-9-4.8 MCG/ACT AERO Inhale 2 puffs into the lungs 2 (two) times daily. 11/28/20   Nyoka Cowden, MD  furosemide (LASIX) 40 MG tablet Take 1 tablet (40 mg total) by mouth daily. 11/27/20   Kathleene Hazel, MD  Multiple Vitamin (MULTIVITAMIN WITH MINERALS) TABS tablet Take 1 tablet by mouth daily.    [provider]  nitroGLYCERIN (NITROSTAT) 0.4 MG SL tablet Place 1 tablet (0.4 mg total) under the tongue every 5 (five) minutes as needed for chest pain. 11/27/20   Kathleene Hazel, MD  OXYGEN Inhale 2-3 L into the lungs.    [provider]  predniSONE (DELTASONE) 10 MG tablet Take 2 tabs x 5 days 12/18/20   Glenford Bayley, NP    Allergies    Penicillins  Review of Systems   Review of Systems  Respiratory: Positive for shortness of breath.   All other systems reviewed and are negative.   Physical Exam Updated Vital Signs BP (!) 171/92   Pulse (!) 123   Temp 98.2 F (36.8 C)   Resp (!) 31   Ht 5\' 9"  (1.753 m)   Wt 72.6 kg   SpO2 99%   BMI 23.63 kg/m   Physical Exam Vitals and nursing note reviewed.   65 year old male, appears mildly dyspneic at rest, but is in no acute distress. Vital signs are significant for elevated heart rate, respiratory rate, blood pressure. Oxygen saturation is 99%, which is  normal. Head is normocephalic and atraumatic. PERRLA, EOMI. Oropharynx is clear. Neck is nontender and supple without adenopathy or JVD. Back is nontender and there is no CVA tenderness. Lungs have diminished airflow with scattered expiratory wheezes.  There are no rales or rhonchi. Chest is nontender. Heart has regular rate and rhythm without murmur. Abdomen is soft, flat, nontender without masses or hepatosplenomegaly and peristalsis is normoactive. Extremities have no cyanosis or edema, full range of motion is present. Skin is warm and mildly diaphoretic without rash. Neurologic: Mental status is normal, cranial nerves are intact, there are no motor or sensory deficits.  ED Results / Procedures / Treatments   Labs (all labs  ordered are listed, but only abnormal results are displayed) Labs Reviewed  BASIC METABOLIC PANEL - Abnormal; Notable for the following components:      Result Value   Chloride 90 (*)    CO2 35 (*)    Glucose, Bld 124 (*)    All other components within normal limits  CBC WITH DIFFERENTIAL/PLATELET - Abnormal; Notable for the following components:   RBC 4.10 (*)    MCV 101.5 (*)    Lymphs Abs 0.5 (*)    All other components within normal limits  RESP PANEL BY RT-PCR (FLU A&B, COVID) ARPGX2  BRAIN NATRIURETIC PEPTIDE  TROPONIN I (HIGH SENSITIVITY)  TROPONIN I (HIGH SENSITIVITY)    EKG EKG Interpretation  Date/Time:  Wednesday December 20 2020 23:14:38 EDT Ventricular Rate:  105 PR Interval:  165 QRS Duration: 100 QT Interval:  325 QTC Calculation: 430 R Axis:   90 Text Interpretation: Sinus tachycardia Right atrial enlargement Borderline right axis deviation Baseline wander in lead(s) V6 When compared with ECG of 7/29/20231, No significant change was found Confirmed by Dione Booze ([id number]) on 12/20/2020 11:20:05 PM   Radiology DG Chest Port 1 View  Result Date: 12/20/2020 CLINICAL DATA:  Cough and fever EXAM: PORTABLE CHEST 1 VIEW COMPARISON:   06/14/2020, CT 11/23/2019 FINDINGS: Hyperinflation with emphysematous disease. Diffuse bronchitic changes. No consolidation, pleural effusion or pneumothorax. Stable cardiomediastinal silhouette with aortic atherosclerosis. IMPRESSION: Hyperinflation with emphysematous disease and chronic bronchitic changes. No focal pulmonary infiltrate. Electronically Signed   By: Jasmine Pang M.D.   On: 12/20/2020 23:39    Procedures Procedures   Medications Ordered in ED Medications  doxycycline (VIBRA-TABS) tablet 100 mg (has no administration in time range)  albuterol (VENTOLIN HFA) 108 (90 Base) MCG/ACT inhaler 6 puff (6 puffs Inhalation Given 12/20/20 2329)  ipratropium-albuterol (DUONEB) 0.5-2.5 (3) MG/3ML nebulizer solution 3 mL (3 mLs Nebulization Given 12/21/20 0143)  albuterol (PROVENTIL) (2.5 MG/3ML) 0.083% nebulizer solution 2.5 mg (2.5 mg Nebulization Given 12/21/20 0143)    ED Course  I have reviewed the triage vital signs and the nursing notes.  Pertinent labs & imaging results that were available during my care of the patient were reviewed by me and considered in my medical decision making (see chart for details).  MDM Rules/Calculators/A&P COPD exacerbation which has failed to respond to prednisone and azithromycin.  Old records are reviewed, and he had an ED visit for COPD exacerbation 4 weeks ago, hospitalized for COPD last July.  Will give additional albuterol, check chest x-ray and screening labs.  Specimen is sent for respiratory pathogen testing.  ECG is unchanged from prior, chest x-ray shows no evidence of pneumonia.  Labs are unremarkable.  Respiratory pathogen showed no evidence of influenza or COVID-19.  He is given a nebulizer treatment with albuterol and ipratropium.  Following this, he states that his breathing is back to baseline.  Lungs are clear with good air movement and no wheezing.  He is felt to be safe for discharge.  He he is felt to have failed azithromycin, so is given  a prescription for doxycycline.  Prednisone dose prescribed by his pulmonologist was 20 mg a day, will give a prescription for prednisone 50 mg a day for the next 5 days.  He is to follow-up with his pulmonologist.  Strict return precautions given.  Final Clinical Impression(s) / ED Diagnoses Final diagnoses:  COPD exacerbation (HCC)  Macrocytosis without anemia    Rx / DC Orders ED Discharge Orders  Ordered    predniSONE (DELTASONE) 50 MG tablet  Daily        12/21/20 0223    doxycycline (VIBRAMYCIN) 100 MG capsule  2 times daily        12/21/20 0223           Dione Booze, MD 12/21/20 [phone]

## 2020-12-20 NOTE — ED Triage Notes (Addendum)
Pt c/o sob and chest pain. Pt was seen by tele doc a couple days ago and started on antibiotics and prednisone. Ems gave pt 125mg  of solumedrol.

## 2020-12-21 ENCOUNTER — Telehealth: Payer: Self-pay | Admitting: Pulmonary Disease

## 2020-12-21 ENCOUNTER — Ambulatory Visit (INDEPENDENT_AMBULATORY_CARE_PROVIDER_SITE_OTHER): Payer: No Typology Code available for payment source | Admitting: Pulmonary Disease

## 2020-12-21 ENCOUNTER — Encounter: Payer: Self-pay | Admitting: Pulmonary Disease

## 2020-12-21 VITALS — BP 160/88 | HR 114 | Temp 97.5°F | Ht 69.0 in

## 2020-12-21 DIAGNOSIS — J449 Chronic obstructive pulmonary disease, unspecified: Secondary | ICD-10-CM

## 2020-12-21 LAB — CBC WITH DIFFERENTIAL/PLATELET
Abs Immature Granulocytes: 0.03 10*3/uL (ref 0.00–0.07)
Basophils Absolute: 0 10*3/uL (ref 0.0–0.1)
Basophils Relative: 1 %
Eosinophils Absolute: 0 10*3/uL (ref 0.0–0.5)
Eosinophils Relative: 0 %
HCT: 41.6 % (ref 39.0–52.0)
Hemoglobin: 13.9 g/dL (ref 13.0–17.0)
Immature Granulocytes: 0 %
Lymphocytes Relative: 7 %
Lymphs Abs: 0.5 10*3/uL — ABNORMAL LOW (ref 0.7–4.0)
MCH: 33.9 pg (ref 26.0–34.0)
MCHC: 33.4 g/dL (ref 30.0–36.0)
MCV: 101.5 fL — ABNORMAL HIGH (ref 80.0–100.0)
Monocytes Absolute: 0.5 10*3/uL (ref 0.1–1.0)
Monocytes Relative: 6 %
Neutro Abs: 6.4 10*3/uL (ref 1.7–7.7)
Neutrophils Relative %: 86 %
Platelets: 228 10*3/uL (ref 150–400)
RBC: 4.1 MIL/uL — ABNORMAL LOW (ref 4.22–5.81)
RDW: 13.4 % (ref 11.5–15.5)
WBC Morphology: INCREASED
WBC: 7.4 10*3/uL (ref 4.0–10.5)
nRBC: 0 % (ref 0.0–0.2)

## 2020-12-21 LAB — BASIC METABOLIC PANEL
Anion gap: 11 (ref 5–15)
BUN: 12 mg/dL (ref 8–23)
CO2: 35 mmol/L — ABNORMAL HIGH (ref 22–32)
Calcium: 9.4 mg/dL (ref 8.9–10.3)
Chloride: 90 mmol/L — ABNORMAL LOW (ref 98–111)
Creatinine, Ser: 0.8 mg/dL (ref 0.61–1.24)
GFR, Estimated: >60 mL/min (ref >60)
Glucose, Bld: 124 mg/dL — ABNORMAL HIGH (ref 70–99)
Potassium: 4.1 mmol/L (ref 3.5–5.1)
Sodium: 136 mmol/L (ref 135–145)

## 2020-12-21 LAB — BRAIN NATRIURETIC PEPTIDE: B Natriuretic Peptide: 75 pg/mL (ref 0.0–100.0)

## 2020-12-21 LAB — RESP PANEL BY RT-PCR (FLU A&B, COVID) ARPGX2
Influenza A by PCR: NEGATIVE
Influenza B by PCR: NEGATIVE
SARS Coronavirus 2 by RT PCR: NEGATIVE

## 2020-12-21 LAB — TROPONIN I (HIGH SENSITIVITY): Troponin I (High Sensitivity): 7 ng/L (ref <18)

## 2020-12-21 MED ORDER — IPRATROPIUM BROMIDE 0.02 % IN SOLN: 0.5000 mg | Freq: Once | RESPIRATORY_TRACT | Status: DC

## 2020-12-21 MED ORDER — PREDNISONE 50 MG PO TABS: 50.0000 mg | ORAL_TABLET | Freq: Every day | ORAL | 0 refills | Status: DC

## 2020-12-21 MED ORDER — ALBUTEROL SULFATE (2.5 MG/3ML) 0.083% IN NEBU
2.5000 mg | INHALATION_SOLUTION | Freq: Once | RESPIRATORY_TRACT | Status: AC
  Administered 2020-12-21: 2.5 mg via RESPIRATORY_TRACT
  Filled 2020-12-21: qty 3

## 2020-12-21 MED ORDER — IPRATROPIUM-ALBUTEROL 0.5-2.5 (3) MG/3ML IN SOLN
3.0000 mL | Freq: Once | RESPIRATORY_TRACT | Status: AC
  Administered 2020-12-21: 3 mL via RESPIRATORY_TRACT
  Filled 2020-12-21: qty 3

## 2020-12-21 MED ORDER — ALBUTEROL SULFATE (2.5 MG/3ML) 0.083% IN NEBU: 2.5000 mg | INHALATION_SOLUTION | RESPIRATORY_TRACT | 5 refills | Status: DC | PRN

## 2020-12-21 MED ORDER — ALBUTEROL SULFATE (2.5 MG/3ML) 0.083% IN NEBU: 5.0000 mg | INHALATION_SOLUTION | Freq: Once | RESPIRATORY_TRACT | Status: DC

## 2020-12-21 MED ORDER — DOXYCYCLINE HYCLATE 100 MG PO TABS
100.0000 mg | ORAL_TABLET | Freq: Once | ORAL | Status: AC
  Administered 2020-12-21: 100 mg via ORAL
  Filled 2020-12-21: qty 1

## 2020-12-21 MED ORDER — DOXYCYCLINE HYCLATE 100 MG PO CAPS: 100.0000 mg | ORAL_CAPSULE | Freq: Two times a day (BID) | ORAL | 0 refills | Status: DC

## 2020-12-21 NOTE — Discharge Instructions (Addendum)
He had been given a prescription for a new antibiotic and a higher dose of the prednisone.  Continue using your home inhalers as needed.  If your breathing is getting worse, or if you start running a fever, return to the emergency department.

## 2020-12-21 NOTE — Patient Instructions (Signed)
Fill script from ER for prednisone and doxycycline  Will arrange for home nebulizer machine and albuterol to be used with this  Will arrange for a spacer device to use with breztri and albuterol inhalers  Follow up in 4 weeks

## 2020-12-21 NOTE — Progress Notes (Signed)
[facility], [facility]  Chief Complaint  Patient presents with  . Follow-up    On/off fevers since last Friday, productive cough with gray with green tint, very short of breath with even little activity    Constitutional:  BP (!) 160/88 (BP Location: Left Arm, Cuff Size: Normal)   Pulse (!) 114   Temp (!) 97.5 F (36.4 C) (Temporal)   Ht 5\' 9"  (1.753 m)   SpO2 93% Comment: 3L O2  BMI 23.63 kg/m   Past Medical History:  PNA, HTN, HLD, CAD, Seizure, ETOH  Past Surgical History:  His  has a past surgical history that includes Finger surgery (Left); Vasectomy; Coronary angioplasty with stent (09/17/2010; 09/28/2014); Cardiac catheterization (~ 2012); Nasal endoscopy with epistaxis control (Bilateral, 03/2012); and left heart catheterization with coronary angiogram (N/A, 09/28/2014).  Brief Summary:  John [name] is a 65 y.o. 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      Subjective:   He is here with his wife.  He hasn't smoked since last summer.  He qualifies for medicare on April 1.  He was seen in ER yesterday.  CXR didn't show pneumonia and COVID test negative.  Developed fever 101.38F on 12/16/20.  Then had cough, wheeze, chest congestion and shortness of breath.  Improved some after getting neb treatments in ER.  Hasn't filled script for prednisone or ABx yet.  Physical Exam:   Appearance - thin, wearing oxygen  ENMT - no sinus tenderness, no oral exudate, no LAN, Mallampati2 airway, no stridor  Respiratory - decreased breath sounds bilaterally, prolonged exhalation, no wheezing  CV - s1s2 regular rate and rhythm, no murmurs  Ext - no clubbing, no edema  Skin - no rashes  Psych - normal mood and affect    Pulmonary testing:   PFT 12/09/19 >> FEV1 .059 (16%), FEV1% 37, DLCO 27%  A1AT 03/09/20 >> 110, MZ  ABG 05/12/20 >> pH 7.4, PCO2 54,  PO2 73  Chest Imaging:   CT angio chest 11/23/19 >> moderate centrilobular emphysema, mild infrahilar BTX, 6 mm nodule LUL  Cardiac Tests:   Echo 04/19/20 >> EF 65 to 70%  Social History:  He  reports that he quit smoking about 8 months ago. His smoking use included cigarettes. He started smoking about 52 years ago. He has a 100.00 pack-year smoking history. He has never used smokeless tobacco. He reports current alcohol use of about 144.0 standard drinks of alcohol per week. He reports that he does not use drugs.  Family History:  His family history includes Brain cancer in his mother; Heart disease in his father.     Assessment/Plan:   COPD exacerbation. - advised him to fill script for prednisone and doxycycline from the ER  COPD from emphysema. - continue breztri and prn albuterol - will arrange for spacer device  - will arrange for home nebulizer machine - will discuss whether he should get second COVID vaccine booster at his next visit - previously discussed option of referral to lung transplant center for assessment; he didn't think that this logistically would be feasible for him and opted against this, but this might need to be revisited  MZ alpha 1 antitrypsin heterozygote. - his level is sufficient that he does require augmentation therapy  Chronic respiratory failure with hypoxia and hypercapnia. - he uses [employer] for his DME - goal SpO2 > 90% - continue 2 to 3  liters 24/7  Lt upper lobe lung nodule. - has CT chest scheduled for January 01, 2021  Time Spent Involved in Patient Care on Day of Examination:  35 minutes  Follow up:  Patient Instructions  Fill script from ER for prednisone and doxycycline  Will arrange for home nebulizer machine and albuterol to be used with this  Will arrange for a spacer device to use with breztri and albuterol inhalers  Follow up in 4 weeks   Medication List:   Allergies as of 12/21/2020      Reactions   Penicillins Other  (See Comments)   Passes out      Medication List       Accurate as of December 21, 2020  1:57 PM. If you have any questions, ask your nurse or doctor.        albuterol 108 (90 Base) MCG/ACT inhaler Commonly known as: ProAir HFA 2 puffs every 4 hours as needed only  if your can't catch your breath What changed: Another medication with the same name was added. Make sure you understand how and when to take each. Changed by: Coralyn Helling, MD   albuterol (2.5 MG/3ML) 0.083% nebulizer solution Commonly known as: PROVENTIL Take 3 mLs (2.5 mg total) by nebulization every 4 (four) hours as needed for wheezing or shortness of breath. What changed: You were already taking a medication with the same name, and this prescription was added. Make sure you understand how and when to take each. Changed by: Coralyn Helling, MD   aspirin EC 81 MG tablet Take 81 mg by mouth daily.   atorvastatin 40 MG tablet Commonly known as: LIPITOR Take 1 tablet (40 mg total) by mouth daily.   azithromycin 250 MG tablet Commonly known as: ZITHROMAX Zpack taper as directed   Breztri Aerosphere 160-9-4.8 MCG/ACT Aero Generic drug: Budeson-Glycopyrrol-Formoterol Inhale 2 puffs into the lungs 2 (two) times daily.   doxycycline 100 MG capsule Commonly known as: VIBRAMYCIN Take 1 capsule (100 mg total) by mouth 2 (two) times daily.   furosemide 40 MG tablet Commonly known as: LASIX Take 1 tablet (40 mg total) by mouth daily.   multivitamin with minerals Tabs tablet Take 1 tablet by mouth daily.   nitroGLYCERIN 0.4 MG SL tablet Commonly known as: Nitrostat Place 1 tablet (0.4 mg total) under the tongue every 5 (five) minutes as needed for chest pain.   OXYGEN Inhale 2-3 L into the lungs.   predniSONE 10 MG tablet Commonly known as: DELTASONE Take 10 mg by mouth daily with breakfast.   predniSONE 50 MG tablet Commonly known as: DELTASONE Take 1 tablet (50 mg total) by mouth daily. Take 2 tabs x 5 days        Signature:  Coralyn Helling, MD [facility] Pulmonary/Critical Care Pager - [phone] 12/21/2020, 1:57 PM

## 2020-12-22 ENCOUNTER — Telehealth: Payer: Self-pay | Admitting: Pulmonary Disease

## 2020-12-22 NOTE — Telephone Encounter (Signed)
Called and spoke to Alvira Monday, pt's girlfriend (on [place]). She states the pt needs the face mask for the nebulizer. Debbie states she was told by [employer] when she was getting the neb machine that they could ship it which would take 3-5 business days or it could be picked up at the [facility] or [place] office, which Eunice Blase thinks would be the same for the mask. The pt has already received the machine, tubing and mouthpieces but is only now requiring a mask. Eunice Blase states she will try a local supply store before calling [employer] back. Debbie aware to contact us back if they need our help with [employer]. Nothing further needed at this time.

## 2020-12-22 NOTE — Telephone Encounter (Signed)
error 

## 2020-12-25 DIAGNOSIS — J449 Chronic obstructive pulmonary disease, unspecified: Secondary | ICD-10-CM | POA: Diagnosis not present

## 2021-01-01 ENCOUNTER — Ambulatory Visit (INDEPENDENT_AMBULATORY_CARE_PROVIDER_SITE_OTHER)
Admission: RE | Admit: 2021-01-01 | Discharge: 2021-01-01 | Disposition: A | Discharge status: Home / Self Care | Payer: Medicare Other | Source: Ambulatory Visit | Attending: Primary Care | Admitting: Primary Care

## 2021-01-01 ENCOUNTER — Other Ambulatory Visit: Payer: Self-pay

## 2021-01-01 DIAGNOSIS — I700 Atherosclerosis of aorta: Secondary | ICD-10-CM | POA: Diagnosis not present

## 2021-01-01 DIAGNOSIS — I2510 Atherosclerotic heart disease of native coronary artery without angina pectoris: Secondary | ICD-10-CM | POA: Diagnosis not present

## 2021-01-01 DIAGNOSIS — J929 Pleural plaque without asbestos: Secondary | ICD-10-CM | POA: Diagnosis not present

## 2021-01-01 DIAGNOSIS — J432 Centrilobular emphysema: Secondary | ICD-10-CM | POA: Diagnosis not present

## 2021-01-01 DIAGNOSIS — R911 Solitary pulmonary nodule: Secondary | ICD-10-CM | POA: Diagnosis not present

## 2021-01-03 ENCOUNTER — Other Ambulatory Visit: Payer: Self-pay | Admitting: *Deleted

## 2021-01-03 DIAGNOSIS — R911 Solitary pulmonary nodule: Secondary | ICD-10-CM

## 2021-01-03 NOTE — Progress Notes (Signed)
Stable left upper lobe nodules considered benign. New 1.1cm flattened nodule or opacity likely inflammatory or infectious but can not rule out malignancy. Needs follow-up ct chest in 3 months to assess for change  Does she have any resp infection symptoms?

## 2021-01-09 ENCOUNTER — Ambulatory Visit: Payer: Self-pay | Admitting: Physician Assistant

## 2021-01-19 ENCOUNTER — Other Ambulatory Visit: Payer: Self-pay

## 2021-01-19 ENCOUNTER — Encounter: Payer: Self-pay | Admitting: Pulmonary Disease

## 2021-01-19 ENCOUNTER — Ambulatory Visit: Payer: Medicare Other | Admitting: Pulmonary Disease

## 2021-01-19 VITALS — BP 122/70 | HR 109 | Temp 98.3°F | Ht 69.0 in | Wt 165.8 lb

## 2021-01-19 DIAGNOSIS — J9611 Chronic respiratory failure with hypoxia: Secondary | ICD-10-CM

## 2021-01-19 DIAGNOSIS — J441 Chronic obstructive pulmonary disease with (acute) exacerbation: Secondary | ICD-10-CM

## 2021-01-19 DIAGNOSIS — R911 Solitary pulmonary nodule: Secondary | ICD-10-CM | POA: Diagnosis not present

## 2021-01-19 DIAGNOSIS — J449 Chronic obstructive pulmonary disease, unspecified: Secondary | ICD-10-CM

## 2021-01-19 MED ORDER — AZITHROMYCIN 250 MG PO TABS: ORAL_TABLET | ORAL | 0 refills | Status: DC

## 2021-01-19 MED ORDER — GUAIFENESIN ER 600 MG PO TB12: 1200.0000 mg | ORAL_TABLET | Freq: Two times a day (BID) | ORAL | Status: AC | PRN

## 2021-01-19 NOTE — Progress Notes (Signed)
[facility], [facility] Sleep [facility]  Chief Complaint  Patient presents with  . Follow-up    Little bit of chest congestion this morning, non productive cough yesterday    Constitutional:  BP 122/70 (BP Location: Left Arm, Cuff Size: Normal)   Pulse (!) 109   Temp 98.3 F (36.8 C) (Temporal)   Ht 5\' 9"  (1.753 m)   Wt 165 lb 12.8 oz (75.2 kg)   SpO2 94% Comment: 3 Liters O2  BMI 24.48 kg/m   Past Medical History:  PNA, HTN, HLD, CAD, Seizure, ETOH  Past Surgical History:  His  has a past surgical history that includes Finger surgery (Left); Vasectomy; Coronary angioplasty with stent (09/17/2010; 09/28/2014); Cardiac catheterization (~ 2012); Nasal endoscopy with epistaxis control (Bilateral, 03/2012); and left heart catheterization with coronary angiogram (N/A, 09/28/2014).  Brief Summary:  John [name] is a 65 y.o. 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      Subjective:   He is here with his wife.  He had CT chest earlier this month.  Showed new opacity in LLL.    Has more cough over the past 3 or 4 days.  Has rattle in his chest.  Gets Left side of his nose blocked up.  Occasional coughs up plugs.  Mucinex helped before, but not using now.  Has been using nebulizer on regular basis and this helps.   Physical Exam:   Appearance - well kempt, thin wearing oxygen  ENMT - no sinus tenderness, no oral exudate, no LAN, Mallampati 2 airway, no stridor  Respiratory - decreased breath sounds bilaterally, no wheezing or rales  CV - s1s2 regular rate and rhythm, no murmurs  Ext - no clubbing, no edema  Skin - no rashes  Psych - normal mood and affect   Pulmonary testing:   PFT 12/09/19 >> FEV1 .059 (16%), FEV1% 37, DLCO 27%  A1AT 03/09/20 >> 110, MZ  ABG 05/12/20 >> pH 7.4, PCO2 54, PO2 73  Chest Imaging:   CT angio chest 11/23/19 >> moderate centrilobular  emphysema, mild infrahilar BTX, 6 mm nodule LUL  CT chest 01/02/21 >> no change LUL nodule, new 1.1 cm LLL opacity  Cardiac Tests:   Echo 04/19/20 >> EF 65 to 70%  Social History:  He  reports that he quit smoking about 9 months ago. His smoking use included cigarettes. He started smoking about 52 years ago. He has a 100.00 pack-year smoking history. He has never used smokeless tobacco. He reports current alcohol use of about 144.0 standard drinks of alcohol per week. He reports that he does not use drugs.  Family History:  His family history includes Brain cancer in his mother; Heart disease in his father.     Assessment/Plan:   COPD from emphysema. - has recurrent exacerbation - don't think he needs additional prednisone at this time - will give course of zithromax again and have him use mucinex - continue breztri with spacer device - continue scheduled albuterol - discussed whether to reconsider referral for lung transplant assessment; he will think about this  MZ alpha 1 antitrypsin heterozygote. - his level is sufficient that he does require augmentation therapy  Chronic respiratory failure with hypoxia and hypercapnia. - he uses [employer] for his DME - goal SpO2 > 90% - continue 2 to 3 liters 24/7  Lung nodule. - left upper lobe nodule is stable and likely benign - has new  opacity in Lt lower lobe; will need follow up CT chest in July 2022  COVID 19 advice. - advise him that CDC recommendation is to get second booster shot given lung disease and age over 5 - he will plan to get second booster once he recovers from current exacerbation  Time Spent Involved in Patient Care on Day of Examination:  33 minutes  Follow up:  Patient Instructions  Zithromax 250 mg pill >> 2 pills on day 1, then 1 pill daily for next 4 days  Mucinex 1200 mg twice per day as needed to help clear up chest and sinus congestion  Follow up in 3 months   Medication List:   Allergies as of  01/19/2021      Reactions   Penicillins Other (See Comments)   Passes out      Medication List       Accurate as of January 19, 2021  1:10 PM. If you have any questions, ask your nurse or doctor.        STOP taking these medications   doxycycline 100 MG capsule Commonly known as: VIBRAMYCIN Stopped by: Coralyn Helling, MD   predniSONE 10 MG tablet Commonly known as: DELTASONE Stopped by: Coralyn Helling, MD   predniSONE 50 MG tablet Commonly known as: DELTASONE Stopped by: Coralyn Helling, MD     TAKE these medications   albuterol 108 (90 Base) MCG/ACT inhaler Commonly known as: ProAir HFA 2 puffs every 4 hours as needed only  if your can't catch your breath   albuterol (2.5 MG/3ML) 0.083% nebulizer solution Commonly known as: PROVENTIL Take 3 mLs (2.5 mg total) by nebulization every 4 (four) hours as needed for wheezing or shortness of breath.   aspirin EC 81 MG tablet Take 81 mg by mouth daily.   atorvastatin 40 MG tablet Commonly known as: LIPITOR Take 1 tablet (40 mg total) by mouth daily.   azithromycin 250 MG tablet Commonly known as: ZITHROMAX Take 2 tablets (500 mg total) by mouth daily for 1 day, THEN 1 tablet (250 mg total) daily for 4 days. Start taking on: January 19, 2021 What changed: See the new instructions. Changed by: Coralyn Helling, MD   Jerrye Bushy [phone] MCG/ACT Aero Generic drug: Budeson-Glycopyrrol-Formoterol Inhale 2 puffs into the lungs 2 (two) times daily.   furosemide 40 MG tablet Commonly known as: LASIX Take 1 tablet (40 mg total) by mouth daily.   guaiFENesin 600 MG 12 hr tablet Commonly known as: Mucinex Take 2 tablets (1,200 mg total) by mouth 2 (two) times daily as needed for cough or to loosen phlegm. Started by: Coralyn Helling, MD   multivitamin with minerals Tabs tablet Take 1 tablet by mouth daily.   nitroGLYCERIN 0.4 MG SL tablet Commonly known as: Nitrostat Place 1 tablet (0.4 mg total) under the tongue every 5 (five)  minutes as needed for chest pain.   OXYGEN Inhale 2-3 L into the lungs.       Signature:  Coralyn Helling, MD [facility] Pulmonary/Critical Care Pager - [phone] 01/19/2021, 1:10 PM

## 2021-01-19 NOTE — Patient Instructions (Signed)
Zithromax 250 mg pill >> 2 pills on day 1, then 1 pill daily for next 4 days  Mucinex 1200 mg twice per day as needed to help clear up chest and sinus congestion  Follow up in 3 months

## 2021-01-24 ENCOUNTER — Ambulatory Visit (INDEPENDENT_AMBULATORY_CARE_PROVIDER_SITE_OTHER): Payer: Medicare Other | Admitting: Internal Medicine

## 2021-01-24 ENCOUNTER — Other Ambulatory Visit: Payer: Self-pay

## 2021-01-24 ENCOUNTER — Encounter: Payer: Self-pay | Admitting: Internal Medicine

## 2021-01-24 VITALS — BP 125/78 | HR 111 | Resp 18 | Ht 69.0 in | Wt 164.4 lb

## 2021-01-24 DIAGNOSIS — J449 Chronic obstructive pulmonary disease, unspecified: Secondary | ICD-10-CM | POA: Diagnosis not present

## 2021-01-24 DIAGNOSIS — J9611 Chronic respiratory failure with hypoxia: Secondary | ICD-10-CM

## 2021-01-24 DIAGNOSIS — Z7689 Persons encountering health services in other specified circumstances: Secondary | ICD-10-CM | POA: Diagnosis not present

## 2021-01-24 DIAGNOSIS — R911 Solitary pulmonary nodule: Secondary | ICD-10-CM | POA: Insufficient documentation

## 2021-01-24 DIAGNOSIS — I2510 Atherosclerotic heart disease of native coronary artery without angina pectoris: Secondary | ICD-10-CM | POA: Diagnosis not present

## 2021-01-24 DIAGNOSIS — Z87891 Personal history of nicotine dependence: Secondary | ICD-10-CM

## 2021-01-24 DIAGNOSIS — M7989 Other specified soft tissue disorders: Secondary | ICD-10-CM | POA: Insufficient documentation

## 2021-01-24 DIAGNOSIS — J9612 Chronic respiratory failure with hypercapnia: Secondary | ICD-10-CM

## 2021-01-24 NOTE — Progress Notes (Signed)
New Patient Office Visit  Subjective:  Patient ID: John [name], male    DOB: Aug 06, 1956  Age: 65 y.o. MRN: [id number]  CC:  Chief Complaint  Patient presents with  . New Patient (Initial Visit)    New patient was being seen at [facility] establishing care     HPI John [name] is 65 year old male with past medical history of CAD s/p stent placement, chronic hypoxic respiratory failure due to COPD and chronic leg edema who presents for establishing care.  He has been using 3 L O2 at home, which was increased recently from 2 L.  He uses Breztri for COPD.  He uses albuterol neb and inhaler as needed for dyspnea/wheezing.  He was recently treated with azithromycin for possible COPD exacerbation.  He has been doing well currently.  He quit smoking last year.  He has a history of CAD s/p stent placement.  He currently denies any chest pain or palpitations.  He follows up with cardiology.  He takes Lasix for chronic leg edema.  He has not had colonoscopy yet and would like to wait for now.  He is up to date with COVID-vaccine.  Past Medical History:  Diagnosis Date  . Alcohol abuse 12/27/2010  . Allergic rhinitis, cause unspecified 12/27/2010  . Anal warts 04/11/2012  . CAD, NATIVE VESSEL 10/04/2010  . COPD (chronic obstructive pulmonary disease) ([place])    "a touch" (09/28/2013)  . History of blood transfusion 03/2012   related to nose bleed  . HYPERLIPIDEMIA-MIXED 10/04/2010  . HYPERTENSION, BENIGN 10/04/2010  . Myocardial infarction ([place]) 09/16/2010  . Pneumonia    "when I was a kid"  . Stented coronary artery    Mid LAD   . TOBACCO ABUSE 10/04/2010    Past Surgical History:  Procedure Laterality Date  . CARDIAC CATHETERIZATION  ~ 2012  . CORONARY ANGIOPLASTY WITH STENT PLACEMENT  09/17/2010; 09/28/2014   "1; 2"  . FINGER SURGERY Left    "almost cut off" tip of 2nd digit  . LEFT HEART CATHETERIZATION WITH CORONARY ANGIOGRAM N/A 09/28/2014   Procedure: LEFT HEART  CATHETERIZATION WITH CORONARY ANGIOGRAM;  Surgeon: Burnell Blanks, MD;  Location: [facility] CATH LAB;  Service: Cardiovascular;  Laterality: N/A;  . NASAL ENDOSCOPY WITH EPISTAXIS CONTROL Bilateral 03/2012  . VASECTOMY      Family History  Problem Relation Age of Onset  . Brain cancer Mother   . Heart disease Father     Social History   Socioeconomic History  . Marital status: Single    Spouse name: Not on file  . Number of children: Not on file  . Years of education: Not on file  . Highest education level: Not on file  Occupational History  . Occupation: Health and safety inspector work     Fish farm manager: [employer]    Comment: [name]  Tobacco Use  . Smoking status: Former Smoker    Packs/day: 2.00    Years: 50.00    Pack years: 100.00    Types: Cigarettes    Start date: 57    Quit date: 04/18/2020    Years since quitting: 0.7  . Smokeless tobacco: Never Used  . Tobacco comment: 2 plus packs per day. He has a 100 + pack - year history of tobacco abuse currently. Former 4 ppd for 25 years.  Vaping Use  . Vaping Use: Never used  Substance and Sexual Activity  . Alcohol use: Yes    Alcohol/week: 144.0 standard drinks  Types: 144 Cans of beer per week    Comment:  history 12 pack of beer per night".  2021- drinks 3 beer/day  . Drug use: No  . Sexual activity: Yes    Birth control/protection: None  Other Topics Concern  . Not on file  Social History Narrative   The patient lives in [place] with his girlfriend. 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 He is not routinely exercising.   Social Determinants of Health   Financial Resource Strain: Not on file  Food Insecurity: Not on file  Transportation Needs: Not on file  Physical Activity: Not on file  Stress: Not on file  Social Connections: Not on file  Intimate Partner Violence: Not on file    ROS Review of Systems  Constitutional: Negative for chills and fever.  HENT: Negative for congestion and sore throat.    Eyes: Negative for pain and discharge.  Respiratory: Positive for shortness of breath (Chronic). Negative for cough.   Cardiovascular: Negative for chest pain and palpitations.  Gastrointestinal: Negative for constipation, diarrhea, nausea and vomiting.  Endocrine: Negative for polydipsia and polyuria.  Genitourinary: Negative for dysuria and hematuria.  Musculoskeletal: Negative for neck pain and neck stiffness.  Skin: Negative for rash.  Neurological: Negative for dizziness, weakness, numbness and headaches.  Psychiatric/Behavioral: Negative for agitation and behavioral problems.    Objective:   Today's Vitals: BP 125/78 (BP Location: Right Arm, Patient Position: Sitting, Cuff Size: Normal)   Pulse (!) 111   Resp 18   Ht 5' 9"  (1.753 m)   Wt 164 lb 6.4 oz (74.6 kg)   SpO2 91%   BMI 24.28 kg/m   Physical Exam Vitals reviewed.  Constitutional:      General: He is not in acute distress.    Appearance: He is not diaphoretic.  HENT:     Head: Normocephalic and atraumatic.     Nose: Nose normal.     Mouth/Throat:     Mouth: Mucous membranes are moist.  Eyes:     General: No scleral icterus.    Extraocular Movements: Extraocular movements intact.  Cardiovascular:     Rate and Rhythm: Normal rate and regular rhythm.     Pulses: Normal pulses.     Heart sounds: Normal heart sounds. No murmur heard.   Pulmonary:     Breath sounds: Normal breath sounds. No wheezing or rales.     Comments: On 3 l O2 Musculoskeletal:     Cervical back: Neck supple. No tenderness.     Right lower leg: No edema.     Left lower leg: No edema.  Skin:    General: Skin is warm.     Findings: No rash.  Neurological:     General: No focal deficit present.     Mental Status: He is alert and oriented to person, place, and time.  Psychiatric:        Mood and Affect: Mood normal.        Behavior: Behavior normal.     Assessment & Plan:   Problem List Items Addressed This Visit       Encounter to establish care    -  Primary Care established Previous chart reviewed History and medications reviewed with the patient  Relevant Orders  CBC with Differential/Platelet  CMP14+EGFR  TSH  Lipid panel  Vitamin D (25 hydroxy)  Hemoglobin A1c    Cardiovascular and Mediastinum   CAD, NATIVE VESSEL    S/p stent placement  in 2011 and 2015 On aspirin and statin Did not tolerate beta-blocker in the past Follows up with cardiology        Respiratory   Chronic respiratory failure with hypoxia and hypercapnia (HCC)    On 3 L O2 at home Due to underlying COPD On Breztri Albuterol nebs and inhaler as needed Follows up with Dr. Halford Chessman      Chronic obstructive pulmonary disease ([place])    Well-controlled with Breztri Albuterol PRN F/u with Pulmonology        Other   Former smoker   Leg swelling    Likely due to venous insufficiency On Lasix 40 Mg daily      Lung nodule    Significant smoking history in the past Planned to get repeat CT chest       Outpatient Encounter Medications as of 01/24/2021  Medication Sig  . albuterol (PROAIR HFA) 108 (90 Base) MCG/ACT inhaler 2 puffs every 4 hours as needed only  if your can't catch your breath  . albuterol (PROVENTIL) (2.5 MG/3ML) 0.083% nebulizer solution Take 3 mLs (2.5 mg total) by nebulization every 4 (four) hours as needed for wheezing or shortness of breath.  Marland Kitchen aspirin EC 81 MG tablet Take 81 mg by mouth daily.  Marland Kitchen atorvastatin (LIPITOR) 40 MG tablet Take 1 tablet (40 mg total) by mouth daily.  . Budeson-Glycopyrrol-Formoterol (BREZTRI AEROSPHERE) 160-9-4.8 MCG/ACT AERO Inhale 2 puffs into the lungs 2 (two) times daily.  . furosemide (LASIX) 40 MG tablet Take 1 tablet (40 mg total) by mouth daily.  Marland Kitchen guaiFENesin (MUCINEX) 600 MG 12 hr tablet Take 2 tablets (1,200 mg total) by mouth 2 (two) times daily as needed for cough or to loosen phlegm.  . Multiple Vitamin (MULTIVITAMIN WITH MINERALS) TABS tablet Take 1 tablet by  mouth daily.  . nitroGLYCERIN (NITROSTAT) 0.4 MG SL tablet Place 1 tablet (0.4 mg total) under the tongue every 5 (five) minutes as needed for chest pain.  . OXYGEN Inhale 2-3 L into the lungs.  . [DISCONTINUED] azithromycin (ZITHROMAX) 250 MG tablet Take 2 tablets (500 mg total) by mouth daily for 1 day, THEN 1 tablet (250 mg total) daily for 4 days. (Patient not taking: Reported on 01/24/2021)   No facility-administered encounter medications on file as of 01/24/2021.    Follow-up: Return in about 4 months (around 05/27/2021) for CAD and COPD.   Lindell Spar, MD

## 2021-01-24 NOTE — Assessment & Plan Note (Signed)
Well-controlled with Breztri Albuterol PRN F/u with Pulmonology

## 2021-01-24 NOTE — Patient Instructions (Signed)
Please continue taking medications as prescribed.  Please start taking Magnesium 400 mg once daily and Vitamin B12 - 1000 mcg once daily.  Please get fasting blood tests before the next visit.  Please contact us when you are ready to be referred to GI for colonoscopy.

## 2021-01-24 NOTE — Assessment & Plan Note (Signed)
S/p stent placement in 2011 and 2015 On aspirin and statin Did not tolerate beta-blocker in the past Follows up with cardiology

## 2021-01-24 NOTE — Assessment & Plan Note (Signed)
On 3 L O2 at home Due to underlying COPD On Breztri Albuterol nebs and inhaler as needed Follows up with Dr. Craige Cotta

## 2021-01-24 NOTE — Assessment & Plan Note (Addendum)
Likely due to venous insufficiency On Lasix 40 Mg daily

## 2021-01-24 NOTE — Assessment & Plan Note (Signed)
Significant smoking history in the past Planned to get repeat CT chest 

## 2021-02-12 ENCOUNTER — Encounter (HOSPITAL_COMMUNITY): Payer: Self-pay

## 2021-02-12 ENCOUNTER — Emergency Department (HOSPITAL_COMMUNITY): Payer: Medicare Other

## 2021-02-12 ENCOUNTER — Telehealth: Payer: Self-pay | Admitting: Pulmonary Disease

## 2021-02-12 ENCOUNTER — Emergency Department (HOSPITAL_COMMUNITY)
Admission: EM | Admit: 2021-02-12 | Discharge: 2021-02-13 | Disposition: A | Discharge status: Home / Self Care | Payer: Medicare Other | Attending: Emergency Medicine | Admitting: Emergency Medicine

## 2021-02-12 ENCOUNTER — Other Ambulatory Visit: Payer: Self-pay

## 2021-02-12 DIAGNOSIS — J441 Chronic obstructive pulmonary disease with (acute) exacerbation: Secondary | ICD-10-CM

## 2021-02-12 DIAGNOSIS — Z7982 Long term (current) use of aspirin: Secondary | ICD-10-CM | POA: Insufficient documentation

## 2021-02-12 DIAGNOSIS — Z7951 Long term (current) use of inhaled steroids: Secondary | ICD-10-CM | POA: Insufficient documentation

## 2021-02-12 DIAGNOSIS — J439 Emphysema, unspecified: Secondary | ICD-10-CM | POA: Diagnosis not present

## 2021-02-12 DIAGNOSIS — R0689 Other abnormalities of breathing: Secondary | ICD-10-CM

## 2021-02-12 DIAGNOSIS — J181 Lobar pneumonia, unspecified organism: Secondary | ICD-10-CM | POA: Insufficient documentation

## 2021-02-12 DIAGNOSIS — R069 Unspecified abnormalities of breathing: Secondary | ICD-10-CM | POA: Diagnosis not present

## 2021-02-12 DIAGNOSIS — R0902 Hypoxemia: Secondary | ICD-10-CM | POA: Diagnosis not present

## 2021-02-12 DIAGNOSIS — Z955 Presence of coronary angioplasty implant and graft: Secondary | ICD-10-CM | POA: Diagnosis not present

## 2021-02-12 DIAGNOSIS — R0682 Tachypnea, not elsewhere classified: Secondary | ICD-10-CM | POA: Insufficient documentation

## 2021-02-12 DIAGNOSIS — R0602 Shortness of breath: Secondary | ICD-10-CM | POA: Diagnosis not present

## 2021-02-12 DIAGNOSIS — J80 Acute respiratory distress syndrome: Secondary | ICD-10-CM | POA: Diagnosis not present

## 2021-02-12 DIAGNOSIS — R000 Tachycardia, unspecified: Secondary | ICD-10-CM | POA: Diagnosis not present

## 2021-02-12 DIAGNOSIS — Z79899 Other long term (current) drug therapy: Secondary | ICD-10-CM | POA: Insufficient documentation

## 2021-02-12 DIAGNOSIS — Z87891 Personal history of nicotine dependence: Secondary | ICD-10-CM | POA: Diagnosis not present

## 2021-02-12 DIAGNOSIS — J189 Pneumonia, unspecified organism: Secondary | ICD-10-CM | POA: Diagnosis not present

## 2021-02-12 DIAGNOSIS — I2510 Atherosclerotic heart disease of native coronary artery without angina pectoris: Secondary | ICD-10-CM | POA: Insufficient documentation

## 2021-02-12 DIAGNOSIS — I10 Essential (primary) hypertension: Secondary | ICD-10-CM | POA: Diagnosis not present

## 2021-02-12 DIAGNOSIS — Z20822 Contact with and (suspected) exposure to covid-19: Secondary | ICD-10-CM | POA: Insufficient documentation

## 2021-02-12 LAB — CBC
HCT: 40.6 % (ref 39.0–52.0)
Hemoglobin: 13 g/dL (ref 13.0–17.0)
MCH: 33.1 pg (ref 26.0–34.0)
MCHC: 32 g/dL (ref 30.0–36.0)
MCV: 103.3 fL — ABNORMAL HIGH (ref 80.0–100.0)
Platelets: 209 10*3/uL (ref 150–400)
RBC: 3.93 MIL/uL — ABNORMAL LOW (ref 4.22–5.81)
RDW: 12.3 % (ref 11.5–15.5)
WBC: 7.9 10*3/uL (ref 4.0–10.5)
nRBC: 0 % (ref 0.0–0.2)

## 2021-02-12 LAB — BLOOD GAS, ARTERIAL
Acid-Base Excess: 10.3 mmol/L — ABNORMAL HIGH (ref 0.0–2.0)
Bicarbonate: 31.4 mmol/L — ABNORMAL HIGH (ref 20.0–28.0)
FIO2: 40
O2 Saturation: 97.2 %
Patient temperature: 37
pCO2 arterial: 80.9 mmHg (ref 32.0–48.0)
pH, Arterial: 7.283 — ABNORMAL LOW (ref 7.350–7.450)
pO2, Arterial: 109 mmHg — ABNORMAL HIGH (ref 83.0–108.0)

## 2021-02-12 LAB — BASIC METABOLIC PANEL
Anion gap: 7 (ref 5–15)
BUN: 12 mg/dL (ref 8–23)
CO2: 39 mmol/L — ABNORMAL HIGH (ref 22–32)
Calcium: 9.1 mg/dL (ref 8.9–10.3)
Chloride: 89 mmol/L — ABNORMAL LOW (ref 98–111)
Creatinine, Ser: 1.02 mg/dL (ref 0.61–1.24)
GFR, Estimated: >60 mL/min (ref >60)
Glucose, Bld: 142 mg/dL — ABNORMAL HIGH (ref 70–99)
Potassium: 4 mmol/L (ref 3.5–5.1)
Sodium: 135 mmol/L (ref 135–145)

## 2021-02-12 LAB — RESP PANEL BY RT-PCR (FLU A&B, COVID) ARPGX2
Influenza A by PCR: NEGATIVE
Influenza B by PCR: NEGATIVE
SARS Coronavirus 2 by RT PCR: NEGATIVE

## 2021-02-12 MED ORDER — METHYLPREDNISOLONE SODIUM SUCC 125 MG IJ SOLR
125.0000 mg | Freq: Once | INTRAMUSCULAR | Status: AC
  Administered 2021-02-12: 125 mg via INTRAVENOUS
  Filled 2021-02-12: qty 2

## 2021-02-12 MED ORDER — SODIUM CHLORIDE 0.9 % IV SOLN
500.0000 mg | Freq: Once | INTRAVENOUS | Status: AC
  Administered 2021-02-12: 500 mg via INTRAVENOUS
  Filled 2021-02-12 (×2): qty 500

## 2021-02-12 MED ORDER — IPRATROPIUM-ALBUTEROL 0.5-2.5 (3) MG/3ML IN SOLN
3.0000 mL | Freq: Once | RESPIRATORY_TRACT | Status: AC
  Administered 2021-02-12: 3 mL via RESPIRATORY_TRACT
  Filled 2021-02-12: qty 3

## 2021-02-12 MED ORDER — SODIUM CHLORIDE 0.9 % IV SOLN
1.0000 g | Freq: Once | INTRAVENOUS | Status: AC
  Administered 2021-02-12: 1 g via INTRAVENOUS
  Filled 2021-02-12: qty 10

## 2021-02-12 MED ORDER — ALBUTEROL SULFATE HFA 108 (90 BASE) MCG/ACT IN AERS
2.0000 | INHALATION_SPRAY | RESPIRATORY_TRACT | Status: DC | PRN
  Administered 2021-02-12: 2 via RESPIRATORY_TRACT
  Filled 2021-02-12: qty 6.7

## 2021-02-12 NOTE — ED Provider Notes (Signed)
[facility] EMERGENCY DEPARTMENT Provider Note  CSN: [id number] Arrival date & time: 02/12/21 1853    History Chief Complaint  Patient presents with  . Shortness of Breath    HPI  John [name] is a 65 y.o. male with history of severe COPD on home oxygen and nebs. He reports increasing SOB and cough productive of green/grey sputum the last few days. He called his pulmonologist's office and was scheduled for a visit tomorrow morning but did not feel he could wait that long. EMS came to the house and found him to be 92% on RA. He was placed on NRB for comfort and transported to the ED. He had albuterol treatment at home around 4pm today without much improvement. Also took lasix per usual dose. Denies fever, CP, or leg swelling.   Past Medical History:  Diagnosis Date  . Alcohol abuse 12/27/2010  . Allergic rhinitis, cause unspecified 12/27/2010  . Anal warts 04/11/2012  . CAD, NATIVE VESSEL 10/04/2010  . COPD (chronic obstructive pulmonary disease) (HCC)    "a touch" (09/28/2013)  . History of blood transfusion 03/2012   related to nose bleed  . HYPERLIPIDEMIA-MIXED 10/04/2010  . HYPERTENSION, BENIGN 10/04/2010  . Myocardial infarction (HCC) 09/16/2010  . Pneumonia    "when I was a kid"  . Stented coronary artery    Mid LAD   . TOBACCO ABUSE 10/04/2010    Past Surgical History:  Procedure Laterality Date  . CARDIAC CATHETERIZATION  ~ 2012  . CORONARY ANGIOPLASTY WITH STENT PLACEMENT  09/17/2010; 09/28/2014   "1; 2"  . FINGER SURGERY Left    "almost cut off" tip of 2nd digit  . LEFT HEART CATHETERIZATION WITH CORONARY ANGIOGRAM N/A 09/28/2014   Procedure: LEFT HEART CATHETERIZATION WITH CORONARY ANGIOGRAM;  Surgeon: Kathleene Hazel, MD;  Location: [facility] CATH LAB;  Service: Cardiovascular;  Laterality: N/A;  . NASAL ENDOSCOPY WITH EPISTAXIS CONTROL Bilateral 03/2012  . VASECTOMY      Family History  Problem Relation Age of Onset  . Brain cancer Mother   . Heart disease  Father     Social History   Tobacco Use  . Smoking status: Former Smoker    Packs/day: 2.00    Years: 50.00    Pack years: 100.00    Types: Cigarettes    Start date: 70    Quit date: 04/18/2020    Years since quitting: 0.8  . Smokeless tobacco: Never Used  . Tobacco comment: 2 plus packs per day. He has a 100 + pack - year history of tobacco abuse currently. Former 4 ppd for 25 years.  Vaping Use  . Vaping Use: Never used  Substance Use Topics  . Alcohol use: Yes    Alcohol/week: 144.0 standard drinks    Types: 144 Cans of beer per week    Comment:  history 12 pack of beer per night".  2021- drinks 3 beer/day  . Drug use: No     Home Medications Prior to Admission medications   Medication Sig Start Date End Date Taking? Authorizing Provider  albuterol (PROAIR HFA) 108 (90 Base) MCG/ACT inhaler 2 puffs every 4 hours as needed only  if your can't catch your breath 03/09/20   Nyoka Cowden, MD  albuterol (PROVENTIL) (2.5 MG/3ML) 0.083% nebulizer solution Take 3 mLs (2.5 mg total) by nebulization every 4 (four) hours as needed for wheezing or shortness of breath. 12/21/20   Coralyn Helling, MD  aspirin EC 81 MG tablet Take 81 mg  by mouth daily.    [provider]  atorvastatin (LIPITOR) 40 MG tablet Take 1 tablet (40 mg total) by mouth daily. 11/27/20   Kathleene Hazel, MD  Budeson-Glycopyrrol-Formoterol (BREZTRI AEROSPHERE) 160-9-4.8 MCG/ACT AERO Inhale 2 puffs into the lungs 2 (two) times daily. 11/28/20   Nyoka Cowden, MD  furosemide (LASIX) 40 MG tablet Take 1 tablet (40 mg total) by mouth daily. 11/27/20   Kathleene Hazel, MD  guaiFENesin (MUCINEX) 600 MG 12 hr tablet Take 2 tablets (1,200 mg total) by mouth 2 (two) times daily as needed for cough or to loosen phlegm. 01/19/21   Coralyn Helling, MD  Multiple Vitamin (MULTIVITAMIN WITH MINERALS) TABS tablet Take 1 tablet by mouth daily.    [provider]  nitroGLYCERIN (NITROSTAT) 0.4 MG SL tablet Place  1 tablet (0.4 mg total) under the tongue every 5 (five) minutes as needed for chest pain. 11/27/20   Kathleene Hazel, MD  OXYGEN Inhale 2-3 L into the lungs.    [provider]     Allergies    Penicillins   Review of Systems   Review of Systems A comprehensive review of systems was completed and negative except as noted in HPI.    Physical Exam BP 135/88   Pulse (!) 124   Temp 98.7 F (37.1 C) (Oral)   Resp (!) 21   Ht 5\' 9"  (1.753 m)   Wt 74.6 kg   SpO2 96%   BMI 24.28 kg/m   Physical Exam Vitals and nursing note reviewed.  Constitutional:      Appearance: Normal appearance.  HENT:     Head: Normocephalic and atraumatic.     Nose: Nose normal.     Mouth/Throat:     Mouth: Mucous membranes are moist.  Eyes:     Extraocular Movements: Extraocular movements intact.     Conjunctiva/sclera: Conjunctivae normal.  Cardiovascular:     Rate and Rhythm: Tachycardia present.  Pulmonary:     Effort: Tachypnea present.     Comments: Faint end expiratory wheeze, poor air movement.  Abdominal:     General: Abdomen is flat.     Palpations: Abdomen is soft.     Tenderness: There is no abdominal tenderness.  Musculoskeletal:        General: No swelling. Normal range of motion.     Cervical back: Neck supple.  Skin:    General: Skin is warm and dry.  Neurological:     General: No focal deficit present.     Mental Status: He is alert.  Psychiatric:        Mood and Affect: Mood normal.      ED Results / Procedures / Treatments   Labs (all labs ordered are listed, but only abnormal results are displayed) Labs Reviewed  BASIC METABOLIC PANEL - Abnormal; Notable for the following components:      Result Value   Chloride 89 (*)    CO2 39 (*)    Glucose, Bld 142 (*)    All other components within normal limits  CBC - Abnormal; Notable for the following components:   RBC 3.93 (*)    MCV 103.3 (*)    All other components within normal limits  BLOOD GAS,  ARTERIAL - Abnormal; Notable for the following components:   pH, Arterial 7.283 (*)    pCO2 arterial 80.9 (*)    pO2, Arterial 109 (*)    Bicarbonate 31.4 (*)    Acid-Base Excess 10.3 (*)  All other components within normal limits  RESP PANEL BY RT-PCR (FLU A&B, COVID) ARPGX2  CULTURE, BLOOD (ROUTINE X 2)  CULTURE, BLOOD (ROUTINE X 2)    EKG EKG Interpretation  Date/Time:  Monday Feb 12 2021 19:23:32 EDT Ventricular Rate:  143 PR Interval:  167 QRS Duration: 124 QT Interval:  311 QTC Calculation: 460 R Axis:   90 Text Interpretation: Sinus tachycardia Paired ventricular premature complexes Consider right atrial enlargement Nonspecific intraventricular conduction delay Nonspecific T abnormalities, lateral leads No significant change since last tracing Confirmed by Susy FrizzleSheldon, Yesena Reaves [id number]) on 02/12/2021 7:33:18 PM   Radiology DG Chest 2 View  Result Date: 02/12/2021 CLINICAL DATA:  Shortness of breath.  History of COPD. EXAM: CHEST - 2 VIEW COMPARISON:  Radiograph 12/20/2020.  CT 01/01/2021 FINDINGS: Chronic hyperinflation. Emphysema. Patchy opacity at the periphery of the right lung base is new from prior exam. Normal heart size and mediastinal contours. Coronary stent visualized. No pneumothorax. No large pleural effusion. No acute osseous abnormalities are seen. Colonic interposition anteriorly. IMPRESSION: 1. Patchy opacity at the periphery of the right lung base is new from prior exam, suspicious for pneumonia. 2. Chronic hyperinflation and emphysema. Electronically Signed   By: Narda RutherfordMelanie  Sanford M.D.   On: 02/12/2021 20:17    Procedures Procedures  Medications Ordered in the ED Medications  albuterol (VENTOLIN HFA) 108 (90 Base) MCG/ACT inhaler 2 puff (2 puffs Inhalation Given 02/12/21 1911)  methylPREDNISolone sodium succinate (SOLU-MEDROL) 125 mg/2 mL injection 125 mg (125 mg Intravenous Given 02/12/21 2026)  ipratropium-albuterol (DUONEB) 0.5-2.5 (3) MG/3ML nebulizer  solution 3 mL (3 mLs Nebulization Given 02/12/21 2030)  cefTRIAXone (ROCEPHIN) 1 g in sodium chloride 0.9 % 100 mL IVPB (0 g Intravenous Stopped 02/12/21 2309)  azithromycin (ZITHROMAX) 500 mg in sodium chloride 0.9 % 250 mL IVPB (500 mg Intravenous New Bag/Given 02/12/21 2236)     MDM Rules/Calculators/A&P MDM Patient with signs of COPD exacerbation, less likely pneumonia or CHF. Will check labs, CXR, give steroids and nebs when Covid is resulted.  ED Course  I have reviewed the triage vital signs and the nursing notes.  Pertinent labs & imaging results that were available during my care of the patient were reviewed by me and considered in my medical decision making (see chart for details).  Clinical Course as of 02/12/21 2345  Mon Feb 12, 2021  2008 CBC, BMP and Covid/Flu are neg.  [CS]  2021 CXR with new opacity concerning for PNA. Will add cultures and begin Abx for CAP.  [CS]  2327 ABG shows partially compensated respiratory acidosis.  [CS]  2332 Patient reports he is feeling much better. Discussed respiratory acidosis and findings on CXR. Offered admission for further management, however Patient is adamant that he wants to go home so he can see his pulmonologist in the morning. [place] turned down to his usual 3L while his Abx finish running in. If he tolerates that and still wants to go home, will let his Pulmonologist decide on steroids and abx as an outpatient. He was advised that if he worsens during the night he should return to the ER for re-assessment and admission.  [CS]    Clinical Course User Index [CS] Pollyann SavoySheldon, Muhammadali Ries B, MD    Final Clinical Impression(s) / ED Diagnoses Final diagnoses:  COPD exacerbation (HCC)  Community acquired pneumonia of right lower lobe of lung  Hypercapnia    Rx / DC Orders ED Discharge Orders    None  Pollyann Savoy, MD 02/12/21 [phone]

## 2021-02-12 NOTE — Telephone Encounter (Signed)
Spoke with pt who states he has had a productive cough for 3 days. Pt states sputum is green/gray in color. Pt has been scheduled with Dr. Craige Cotta at 9:15am tomorrow in the [place] office. Pt denies f/n/v/d/c. Nothing further needed at this time.

## 2021-02-12 NOTE — ED Triage Notes (Signed)
[facility] from home cc of SOB since Saturday . Has hx of COPD. Takes lasix.  Albuterol tx today around 4pm without help.  92% on room air. 96% on NRB 192/76 140s hr 20g R AC

## 2021-02-12 NOTE — ED Notes (Signed)
CRITICAL VALUE ---  CO2 is 80.9 . Reported to EDP Bernette Mayers and RRT.

## 2021-02-13 ENCOUNTER — Ambulatory Visit: Payer: Medicare Other | Admitting: Pulmonary Disease

## 2021-02-13 ENCOUNTER — Encounter: Payer: Self-pay | Admitting: Pulmonary Disease

## 2021-02-13 VITALS — BP 138/62 | HR 117 | Temp 98.0°F | Ht 69.0 in

## 2021-02-13 DIAGNOSIS — J441 Chronic obstructive pulmonary disease with (acute) exacerbation: Secondary | ICD-10-CM

## 2021-02-13 DIAGNOSIS — J189 Pneumonia, unspecified organism: Secondary | ICD-10-CM | POA: Diagnosis not present

## 2021-02-13 DIAGNOSIS — J432 Centrilobular emphysema: Secondary | ICD-10-CM | POA: Diagnosis not present

## 2021-02-13 DIAGNOSIS — J9612 Chronic respiratory failure with hypercapnia: Secondary | ICD-10-CM

## 2021-02-13 DIAGNOSIS — J9611 Chronic respiratory failure with hypoxia: Secondary | ICD-10-CM

## 2021-02-13 MED ORDER — AZITHROMYCIN 500 MG PO TABS: 500.0000 mg | ORAL_TABLET | Freq: Every day | ORAL | 0 refills | Status: AC

## 2021-02-13 MED ORDER — PREDNISONE 10 MG PO TABS: ORAL_TABLET | ORAL | 0 refills | Status: AC

## 2021-02-13 NOTE — Progress Notes (Signed)
[facility]  Chief Complaint  Patient presents with  . Acute Visit    C/o cough-green x 3 days.Increased sob x 4 days. On 4L oxygen    Constitutional:  BP 138/62 (BP Location: Left Arm, Cuff Size: Normal)   Pulse (!) 117   Temp 98 F (36.7 C) (Temporal)   Ht 5\' 9"  (1.753 m)   SpO2 97%   BMI 24.28 kg/m   Past Medical History:  PNA, HTN, HLD, CAD, Seizure, ETOH  Past Surgical History:  His  has a past surgical history that includes Finger surgery (Left); Vasectomy; Coronary angioplasty with stent (09/17/2010; 09/28/2014); Cardiac catheterization (~ 2012); Nasal endoscopy with epistaxis control (Bilateral, 03/2012); and left heart catheterization with coronary angiogram (N/A, 09/28/2014).  Brief Summary:  John [name] is a 65 y.o. 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      Subjective:   He is here with his family.  He was treated with course of Zpak in April.  Started getting more cough and sputum over the past 3 days.  Bringing up green phlegm.  Went to ER yesterday.  CXR showed faint RLL infiltrate.  He was treated with rocephin, zithromax, duoneb, and solumedrol.  COVID 19 test was negative.  ABG showed acute on chronic respiratory acidosis.  Physical Exam:   Appearance - thin, wearing oxygen, sitting in wheelchair  ENMT - no sinus tenderness, no oral exudate, no LAN, Mallampati 2 airway, no stridor  Respiratory - decreased breath sounds bilaterally, no wheezing or rales  CV - s1s2 regular rate and rhythm, no murmurs  Ext - no clubbing, no edema  Skin - no rashes  Psych - normal mood and affect    Pulmonary testing:   PFT 12/09/19 >> FEV1 .059 (16%), FEV1% 37, DLCO 27%  A1AT 03/09/20 >> 110, MZ  ABG 02/12/21 >> pH 7.28, PCO2 80.9, PO2 109 on 40% FiO2  Chest Imaging:   CT angio chest 11/23/19 >> moderate centrilobular  emphysema, mild infrahilar BTX, 6 mm nodule LUL  CT chest 01/02/21 >> no change LUL nodule, new 1.1 cm LLL opacity  Cardiac Tests:   Echo 04/19/20 >> EF 65 to 70%  Social History:  He  reports that he quit smoking about 9 months ago. His smoking use included cigarettes. He started smoking about 52 years ago. He has a 100.00 pack-year smoking history. He has never used smokeless tobacco. He reports current alcohol use of about 144.0 standard drinks of alcohol per week. He reports that he does not use drugs.  Family History:  His family history includes Brain cancer in his mother; Heart disease in his father.     Assessment/Plan:   Community acquired pneumonia. - he received rocephin and zithromax in ER on 5/23 - will give him course of zithromax for additional 7 days - he will need follow up chest xray at his next visit  COPD exacerbation. - will give course of prednisone  Severe COPD from emphysema. - continue breztri with spacer device - discussed the option of switching to nebulizer medication for maintenance regimen; he will think about this - prn albuterol - he is now agreeable to be consider for lung transplant; will arrange for referral to [facility] lung transplant team  MZ alpha 1 antitrypsin heterozygote. - his level is sufficient that he does require augmentation therapy  Chronic respiratory failure with hypoxia and hypercapnia. - he uses  [employer] for his DME - goal SpO2 > 90% - continue 2 to 3 liters 24/7 - Patient continues to exhibit signs of hypercapnia associated with chronic respiratory failure secondary to severe COPD. Interruption or failure to provide NIV would quickly lead to exacerbation of the patient's condition, hospital admission, and likely harm to the patient. Continued use is preferred. The use of the NIV will treat patient's high PC02 levels and can reduce risk of exacerbations and future hospitalizations when used at night and during the day. BiLevel/RAD has  been considered and ruled out as patient requires continuous alarms, backup battery, and portability which are not possible with BiLevel/RAD devices. Ventilation is required to decrease the work of breathing and improve pulmonary status. Interruption of ventilator support would lead to decline of health status. Patient is able to protect their airways and clear secretions on their own. - will start the process to see if he can get approval for NIMV  Lung nodule. - follow up CT chest in July 2022  Time Spent Involved in Patient Care on Day of Examination:  37 minutes  Follow up:  Patient Instructions  Zithromax 500 mg pill daily or 7 days Prednisone 10 mg pill >> 3 pills daily for 2 days, 2 pills daily for 2 days, 1 pill daily for 2 days Will start process to arrange for home ventilator Will arrange for referral to [facility] to assess for lung transplant  Follow up in 2 weeks in [place] office   Medication List:   Allergies as of 02/13/2021      Reactions   Penicillins Other (See Comments)   Passes out      Medication List       Accurate as of Feb 13, 2021  2:09 PM. If you have any questions, ask your nurse or doctor.        albuterol 108 (90 Base) MCG/ACT inhaler Commonly known as: ProAir HFA 2 puffs every 4 hours as needed only  if your can't catch your breath   albuterol (2.5 MG/3ML) 0.083% nebulizer solution Commonly known as: PROVENTIL Take 3 mLs (2.5 mg total) by nebulization every 4 (four) hours as needed for wheezing or shortness of breath.   aspirin EC 81 MG tablet Take 81 mg by mouth daily.   atorvastatin 40 MG tablet Commonly known as: LIPITOR Take 1 tablet (40 mg total) by mouth daily.   azithromycin 500 MG tablet Commonly known as: Zithromax Take 1 tablet (500 mg total) by mouth daily for 7 days. Started by: Coralyn Helling, MD   Jerrye Bushy [phone] MCG/ACT Aero Generic drug: Budeson-Glycopyrrol-Formoterol Inhale 2 puffs into the lungs  2 (two) times daily.   furosemide 40 MG tablet Commonly known as: LASIX Take 1 tablet (40 mg total) by mouth daily.   guaiFENesin 600 MG 12 hr tablet Commonly known as: Mucinex Take 2 tablets (1,200 mg total) by mouth 2 (two) times daily as needed for cough or to loosen phlegm.   multivitamin with minerals Tabs tablet Take 1 tablet by mouth daily.   nitroGLYCERIN 0.4 MG SL tablet Commonly known as: Nitrostat Place 1 tablet (0.4 mg total) under the tongue every 5 (five) minutes as needed for chest pain.   OXYGEN Inhale 2-3 L into the lungs.   predniSONE 10 MG tablet Commonly known as: DELTASONE Take 3 tablets (30 mg total) by mouth daily with breakfast for 2 days, THEN 2 tablets (20 mg total) daily with breakfast for 2 days, THEN 1 tablet (10  mg total) daily with breakfast for 2 days. Start taking on: Feb 13, 2021 Started by: Coralyn Helling, MD       Signature:  Coralyn Helling, MD [facility] Pulmonary/Critical Care Pager - [phone] 02/13/2021, 2:09 PM

## 2021-02-13 NOTE — ED Notes (Signed)
Pt alert and oriented. Understood d/c papers. Wheeled out to car with family

## 2021-02-13 NOTE — Patient Instructions (Signed)
Zithromax 500 mg pill daily or 7 days Prednisone 10 mg pill >> 3 pills daily for 2 days, 2 pills daily for 2 days, 1 pill daily for 2 days Will start process to arrange for home ventilator Will arrange for referral to [facility] to assess for lung transplant  Follow up in 2 weeks in [place] office

## 2021-02-14 ENCOUNTER — Telehealth: Payer: Self-pay | Admitting: Pulmonary Disease

## 2021-02-14 MED ORDER — TRAZODONE HCL 50 MG PO TABS: 50.0000 mg | ORAL_TABLET | Freq: Every evening | ORAL | 1 refills | Status: DC | PRN

## 2021-02-14 NOTE — Telephone Encounter (Signed)
Called and spoke with John [name]. She is aware of the trazodone and wishes to have this sent to [facility] in [place]. RX has been sent.   Nothing further needed at time of call.

## 2021-02-14 NOTE — Telephone Encounter (Signed)
Spoke with pt's girlfriend (per DPR) who states pt is having trouble sleeping and would like to know if he can have something to help with sleep. Pt's girlfriend denied any other issues. Dr. Craige Cotta will you please advise since Dr. Sherene Sires isn't in the office.

## 2021-02-14 NOTE — Telephone Encounter (Signed)
Send script for trazodone 50 mg qhs prn sleep.

## 2021-02-16 DIAGNOSIS — J449 Chronic obstructive pulmonary disease, unspecified: Secondary | ICD-10-CM | POA: Diagnosis not present

## 2021-02-16 DIAGNOSIS — J9610 Chronic respiratory failure, unspecified whether with hypoxia or hypercapnia: Secondary | ICD-10-CM | POA: Diagnosis not present

## 2021-02-17 LAB — CULTURE, BLOOD (ROUTINE X 2)
Culture: NO GROWTH
Culture: NO GROWTH
Special Requests: ADEQUATE
Special Requests: ADEQUATE

## 2021-02-28 ENCOUNTER — Ambulatory Visit: Payer: Medicare Other | Admitting: Pulmonary Disease

## 2021-02-28 ENCOUNTER — Other Ambulatory Visit: Payer: Self-pay

## 2021-02-28 ENCOUNTER — Encounter: Payer: Self-pay | Admitting: Pulmonary Disease

## 2021-02-28 VITALS — BP 128/70 | HR 104 | Temp 98.3°F | Ht 69.0 in | Wt 160.6 lb

## 2021-02-28 DIAGNOSIS — J432 Centrilobular emphysema: Secondary | ICD-10-CM | POA: Diagnosis not present

## 2021-02-28 DIAGNOSIS — J9612 Chronic respiratory failure with hypercapnia: Secondary | ICD-10-CM

## 2021-02-28 DIAGNOSIS — R911 Solitary pulmonary nodule: Secondary | ICD-10-CM

## 2021-02-28 DIAGNOSIS — J9611 Chronic respiratory failure with hypoxia: Secondary | ICD-10-CM

## 2021-02-28 NOTE — Patient Instructions (Signed)
Will schedule CT chest for July 2022  Follow up in August 2022

## 2021-02-28 NOTE — Progress Notes (Signed)
[place] [facility]  Chief Complaint  Patient presents with  . Follow-up    Shortness of breath with exertion    Constitutional:  BP 128/70 (BP Location: Left Arm, Cuff Size: Normal)   Pulse (!) 104   Temp 98.3 F (36.8 C) (Temporal)   Ht 5\' 9"  (1.753 m)   Wt 160 lb 9.6 oz (72.8 kg)   SpO2 95% Comment: 3 Liters O2  BMI 23.72 kg/m   Past Medical History:  PNA, HTN, HLD, CAD, Seizure, ETOH  Past Surgical History:  His  has a past surgical history that includes Finger surgery (Left); Vasectomy; Coronary angioplasty with stent (09/17/2010; 09/28/2014); Cardiac catheterization (~ 2012); Nasal endoscopy with epistaxis control (Bilateral, 03/2012); and left heart catheterization with coronary angiogram (N/A, 09/28/2014).  Brief Summary:  John [name] is a 65 y.o. 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      Subjective:   He is here with his family.  He is feeling better since he completed extended course of zithromax.  He needs to call [facility] to schedule assessment for transplant.  He plans to set this up in July.  He is very limited in his active.  He uses oxygen 24/7.  He is still coughing, but not bringing up sputum.  He got NIMV.  He tried using it, but felt the pressure was too much.  He only used it for a few minutes.  He feels that trazodone helps his sleep.  He has been discussing goals of care with his family.  Physical Exam:   Appearance - thin, sitting in a wheelchair, wearing oxygen  ENMT - no sinus tenderness, no oral exudate, no LAN, Mallampati 2 airway, no stridor  Respiratory - decreased breath sounds bilaterally, no wheezing or rales  CV - s1s2 regular rate and rhythm, no murmurs  Ext - no clubbing, no edema  Skin - no rashes  Psych - normal mood and affect    Pulmonary testing:   PFT 12/09/19 >> FEV1 .059 (16%), FEV1% 37,  DLCO 27%  A1AT 03/09/20 >> 110, MZ  ABG 02/12/21 >> pH 7.28, PCO2 80.9, PO2 109 on 40% FiO2  Chest Imaging:   CT angio chest 11/23/19 >> moderate centrilobular emphysema, mild infrahilar BTX, 6 mm nodule LUL  CT chest 01/02/21 >> no change LUL nodule, new 1.1 cm LLL opacity  Cardiac Tests:   Echo 04/19/20 >> EF 65 to 70%  Social History:  He  reports that he quit smoking about 10 months ago. His smoking use included cigarettes. He started smoking about 52 years ago. He has a 100.00 pack-year smoking history. He has never used smokeless tobacco. He reports current alcohol use of about 144.0 standard drinks of alcohol per week. He reports that he does not use drugs.  Family History:  His family history includes Brain cancer in his mother; Heart disease in his father.     Assessment/Plan:   Community acquired pneumonia. - clinically improved - defer follow up CXR for now since he will be getting CT chest in July  Severe COPD from emphysema. - continue breztri with spacer device - prn albuterol - he will call [facility] to schedule assessment with transplant team in July  MZ alpha 1 antitrypsin heterozygote. - his level is sufficient that he does require augmentation therapy  Chronic respiratory failure with hypoxia and hypercapnia. - he uses [employer] for his DME -  goal SpO2 > 90% - continue 2 to 3 liters oxygen 24/7 - he has NIMV; discussed role for this and techniques to help his get adjusted to using this; he should use this at night and prn during the day  Lung nodule. - follow up CT chest without contrast in July 2022  Insomnia. - continue trazodone 50 mg qhs prn  Goals of care. - had length discussion with him and his family - he wants to continue current therapies especially if he would be a candidate for lung transplant - he isn't sure if he would want to undergo intubation if he develops respiratory failure; he will talk this over with his family in more detail - he would  not want tracheostomy and prolonged ventilator support if there was not hope of recovery - he would not want cardiac resuscitation in the event of cardiac arrest  Time Spent Involved in Patient Care on Day of Examination:  37 minutes  Follow up:  Patient Instructions  Will schedule CT chest for July 2022  Follow up in August 2022   Medication List:   Allergies as of 02/28/2021      Reactions   Penicillins Other (See Comments)   Passes out      Medication List       Accurate as of February 28, 2021  1:17 PM. If you have any questions, ask your nurse or doctor.        albuterol 108 (90 Base) MCG/ACT inhaler Commonly known as: ProAir HFA 2 puffs every 4 hours as needed only  if your can't catch your breath   albuterol (2.5 MG/3ML) 0.083% nebulizer solution Commonly known as: PROVENTIL Take 3 mLs (2.5 mg total) by nebulization every 4 (four) hours as needed for wheezing or shortness of breath.   aspirin EC 81 MG tablet Take 81 mg by mouth daily.   atorvastatin 40 MG tablet Commonly known as: LIPITOR Take 1 tablet (40 mg total) by mouth daily.   Breztri Aerosphere 160-9-4.8 MCG/ACT Aero Generic drug: Budeson-Glycopyrrol-Formoterol Inhale 2 puffs into the lungs 2 (two) times daily.   furosemide 40 MG tablet Commonly known as: LASIX Take 1 tablet (40 mg total) by mouth daily.   guaiFENesin 600 MG 12 hr tablet Commonly known as: Mucinex Take 2 tablets (1,200 mg total) by mouth 2 (two) times daily as needed for cough or to loosen phlegm.   multivitamin with minerals Tabs tablet Take 1 tablet by mouth daily.   nitroGLYCERIN 0.4 MG SL tablet Commonly known as: Nitrostat Place 1 tablet (0.4 mg total) under the tongue every 5 (five) minutes as needed for chest pain.   OXYGEN Inhale 2-3 L into the lungs.   traZODone 50 MG tablet Commonly known as: DESYREL Take 1 tablet (50 mg total) by mouth at bedtime as needed for sleep.       Signature:  Coralyn Helling, MD [facility]  Pulmonary/Critical Care Pager - [phone] 02/28/2021, 1:17 PM

## 2021-03-19 DIAGNOSIS — J449 Chronic obstructive pulmonary disease, unspecified: Secondary | ICD-10-CM | POA: Diagnosis not present

## 2021-03-19 DIAGNOSIS — J9610 Chronic respiratory failure, unspecified whether with hypoxia or hypercapnia: Secondary | ICD-10-CM | POA: Diagnosis not present

## 2021-03-26 DIAGNOSIS — J449 Chronic obstructive pulmonary disease, unspecified: Secondary | ICD-10-CM | POA: Diagnosis not present

## 2021-03-27 ENCOUNTER — Other Ambulatory Visit: Payer: Self-pay

## 2021-03-27 ENCOUNTER — Ambulatory Visit (HOSPITAL_COMMUNITY)
Admission: RE | Admit: 2021-03-27 | Discharge: 2021-03-27 | Disposition: A | Discharge status: Home / Self Care | Payer: Medicare Other | Source: Ambulatory Visit | Attending: Pulmonary Disease | Admitting: Pulmonary Disease

## 2021-03-27 DIAGNOSIS — R918 Other nonspecific abnormal finding of lung field: Secondary | ICD-10-CM | POA: Diagnosis not present

## 2021-03-27 DIAGNOSIS — R911 Solitary pulmonary nodule: Secondary | ICD-10-CM

## 2021-03-27 DIAGNOSIS — J9811 Atelectasis: Secondary | ICD-10-CM | POA: Diagnosis not present

## 2021-03-27 DIAGNOSIS — J439 Emphysema, unspecified: Secondary | ICD-10-CM | POA: Diagnosis not present

## 2021-04-09 DIAGNOSIS — E785 Hyperlipidemia, unspecified: Secondary | ICD-10-CM | POA: Diagnosis not present

## 2021-04-09 DIAGNOSIS — I714 Abdominal aortic aneurysm, without rupture: Secondary | ICD-10-CM | POA: Diagnosis not present

## 2021-04-09 DIAGNOSIS — I5032 Chronic diastolic (congestive) heart failure: Secondary | ICD-10-CM | POA: Diagnosis not present

## 2021-04-09 DIAGNOSIS — R918 Other nonspecific abnormal finding of lung field: Secondary | ICD-10-CM | POA: Diagnosis not present

## 2021-04-09 DIAGNOSIS — Z79899 Other long term (current) drug therapy: Secondary | ICD-10-CM | POA: Diagnosis not present

## 2021-04-09 DIAGNOSIS — K5730 Diverticulosis of large intestine without perforation or abscess without bleeding: Secondary | ICD-10-CM | POA: Diagnosis not present

## 2021-04-09 DIAGNOSIS — J432 Centrilobular emphysema: Secondary | ICD-10-CM | POA: Diagnosis not present

## 2021-04-09 DIAGNOSIS — I252 Old myocardial infarction: Secondary | ICD-10-CM | POA: Diagnosis not present

## 2021-04-09 DIAGNOSIS — Z7682 Awaiting organ transplant status: Secondary | ICD-10-CM | POA: Diagnosis not present

## 2021-04-09 DIAGNOSIS — Z955 Presence of coronary angioplasty implant and graft: Secondary | ICD-10-CM | POA: Diagnosis not present

## 2021-04-09 DIAGNOSIS — Z87891 Personal history of nicotine dependence: Secondary | ICD-10-CM | POA: Diagnosis not present

## 2021-04-09 DIAGNOSIS — Z7982 Long term (current) use of aspirin: Secondary | ICD-10-CM | POA: Diagnosis not present

## 2021-04-09 DIAGNOSIS — Z1159 Encounter for screening for other viral diseases: Secondary | ICD-10-CM | POA: Diagnosis not present

## 2021-04-09 DIAGNOSIS — I2510 Atherosclerotic heart disease of native coronary artery without angina pectoris: Secondary | ICD-10-CM | POA: Diagnosis not present

## 2021-04-10 DIAGNOSIS — Z7982 Long term (current) use of aspirin: Secondary | ICD-10-CM | POA: Diagnosis not present

## 2021-04-10 DIAGNOSIS — K5730 Diverticulosis of large intestine without perforation or abscess without bleeding: Secondary | ICD-10-CM | POA: Diagnosis not present

## 2021-04-10 DIAGNOSIS — Z23 Encounter for immunization: Secondary | ICD-10-CM | POA: Diagnosis not present

## 2021-04-10 DIAGNOSIS — J9611 Chronic respiratory failure with hypoxia: Secondary | ICD-10-CM | POA: Diagnosis not present

## 2021-04-10 DIAGNOSIS — Z7682 Awaiting organ transplant status: Secondary | ICD-10-CM | POA: Diagnosis not present

## 2021-04-10 DIAGNOSIS — I714 Abdominal aortic aneurysm, without rupture: Secondary | ICD-10-CM | POA: Diagnosis not present

## 2021-04-10 DIAGNOSIS — J449 Chronic obstructive pulmonary disease, unspecified: Secondary | ICD-10-CM | POA: Diagnosis not present

## 2021-04-10 DIAGNOSIS — R262 Difficulty in walking, not elsewhere classified: Secondary | ICD-10-CM | POA: Diagnosis not present

## 2021-04-10 DIAGNOSIS — Z87891 Personal history of nicotine dependence: Secondary | ICD-10-CM | POA: Diagnosis not present

## 2021-04-10 DIAGNOSIS — Z01818 Encounter for other preprocedural examination: Secondary | ICD-10-CM | POA: Diagnosis not present

## 2021-04-10 DIAGNOSIS — J9612 Chronic respiratory failure with hypercapnia: Secondary | ICD-10-CM | POA: Diagnosis not present

## 2021-04-10 DIAGNOSIS — J432 Centrilobular emphysema: Secondary | ICD-10-CM | POA: Diagnosis not present

## 2021-04-10 DIAGNOSIS — Z79899 Other long term (current) drug therapy: Secondary | ICD-10-CM | POA: Diagnosis not present

## 2021-04-11 DIAGNOSIS — Z01818 Encounter for other preprocedural examination: Secondary | ICD-10-CM | POA: Diagnosis not present

## 2021-04-11 DIAGNOSIS — Z7682 Awaiting organ transplant status: Secondary | ICD-10-CM | POA: Diagnosis not present

## 2021-04-12 DIAGNOSIS — Z7682 Awaiting organ transplant status: Secondary | ICD-10-CM | POA: Diagnosis not present

## 2021-04-12 DIAGNOSIS — Z008 Encounter for other general examination: Secondary | ICD-10-CM | POA: Diagnosis not present

## 2021-04-12 DIAGNOSIS — J449 Chronic obstructive pulmonary disease, unspecified: Secondary | ICD-10-CM | POA: Diagnosis not present

## 2021-04-12 DIAGNOSIS — F064 Anxiety disorder due to known physiological condition: Secondary | ICD-10-CM | POA: Diagnosis not present

## 2021-04-12 DIAGNOSIS — Z87898 Personal history of other specified conditions: Secondary | ICD-10-CM | POA: Diagnosis not present

## 2021-04-12 DIAGNOSIS — F17211 Nicotine dependence, cigarettes, in remission: Secondary | ICD-10-CM | POA: Diagnosis not present

## 2021-04-12 DIAGNOSIS — Z01818 Encounter for other preprocedural examination: Secondary | ICD-10-CM | POA: Diagnosis not present

## 2021-04-13 DIAGNOSIS — E785 Hyperlipidemia, unspecified: Secondary | ICD-10-CM | POA: Diagnosis not present

## 2021-04-13 DIAGNOSIS — Z7682 Awaiting organ transplant status: Secondary | ICD-10-CM | POA: Diagnosis not present

## 2021-04-13 DIAGNOSIS — Z01810 Encounter for preprocedural cardiovascular examination: Secondary | ICD-10-CM | POA: Diagnosis not present

## 2021-04-13 DIAGNOSIS — J9691 Respiratory failure, unspecified with hypoxia: Secondary | ICD-10-CM | POA: Diagnosis not present

## 2021-04-13 DIAGNOSIS — Z955 Presence of coronary angioplasty implant and graft: Secondary | ICD-10-CM | POA: Diagnosis not present

## 2021-04-13 DIAGNOSIS — J439 Emphysema, unspecified: Secondary | ICD-10-CM | POA: Diagnosis not present

## 2021-04-13 DIAGNOSIS — I2510 Atherosclerotic heart disease of native coronary artery without angina pectoris: Secondary | ICD-10-CM | POA: Diagnosis not present

## 2021-04-16 ENCOUNTER — Telehealth: Payer: Self-pay | Admitting: Pulmonary Disease

## 2021-04-16 DIAGNOSIS — J432 Centrilobular emphysema: Secondary | ICD-10-CM

## 2021-04-16 MED ORDER — TRAZODONE HCL 50 MG PO TABS: 50.0000 mg | ORAL_TABLET | Freq: Every evening | ORAL | 1 refills | Status: DC | PRN

## 2021-04-16 NOTE — Telephone Encounter (Signed)
Spoke with the pt  He is needing referral to pulmonary rehab to meet requirement for lung transplant  He wants to go somewhere in [place], but states would be willing to go to [place]  I have placed referral and also refilled trazodone for him  Nothing further needed

## 2021-04-17 DIAGNOSIS — Z7682 Awaiting organ transplant status: Secondary | ICD-10-CM | POA: Diagnosis not present

## 2021-04-18 DIAGNOSIS — J449 Chronic obstructive pulmonary disease, unspecified: Secondary | ICD-10-CM | POA: Diagnosis not present

## 2021-04-18 DIAGNOSIS — J9610 Chronic respiratory failure, unspecified whether with hypoxia or hypercapnia: Secondary | ICD-10-CM | POA: Diagnosis not present

## 2021-04-20 ENCOUNTER — Telehealth: Payer: Self-pay | Admitting: Pulmonary Disease

## 2021-04-20 DIAGNOSIS — J432 Centrilobular emphysema: Secondary | ICD-10-CM

## 2021-04-20 DIAGNOSIS — J441 Chronic obstructive pulmonary disease with (acute) exacerbation: Secondary | ICD-10-CM

## 2021-04-20 DIAGNOSIS — J9611 Chronic respiratory failure with hypoxia: Secondary | ICD-10-CM

## 2021-04-20 NOTE — Telephone Encounter (Signed)
Called and spoke with John [name] per DPR who states that patient has had insurance since 12/22/20 and insurance won't pay for the concentrator that he has had since 11/2019.  Requesting order to be sent [employer] (DME for nebulizer and ventilator) to be covered by insurance. Patient wears 3 liters and states he has concentrator, 2 small tanks and 5 big tanks that they are not able to fill and would like to see they can get something to refill them. Order has been placed to be sent to [employer]. Advised them to call us back if anything else is required so we can get him situated. Nothing further needed at this time.

## 2021-04-26 DIAGNOSIS — J449 Chronic obstructive pulmonary disease, unspecified: Secondary | ICD-10-CM | POA: Diagnosis not present

## 2021-04-27 ENCOUNTER — Ambulatory Visit: Payer: Medicare Other | Admitting: Pulmonary Disease

## 2021-04-27 DIAGNOSIS — J984 Other disorders of lung: Secondary | ICD-10-CM | POA: Diagnosis not present

## 2021-04-27 DIAGNOSIS — I714 Abdominal aortic aneurysm, without rupture: Secondary | ICD-10-CM | POA: Diagnosis not present

## 2021-04-27 DIAGNOSIS — Z01818 Encounter for other preprocedural examination: Secondary | ICD-10-CM | POA: Diagnosis not present

## 2021-05-07 DIAGNOSIS — Z01818 Encounter for other preprocedural examination: Secondary | ICD-10-CM | POA: Diagnosis not present

## 2021-05-07 DIAGNOSIS — I714 Abdominal aortic aneurysm, without rupture: Secondary | ICD-10-CM | POA: Diagnosis not present

## 2021-05-07 DIAGNOSIS — Z1331 Encounter for screening for depression: Secondary | ICD-10-CM | POA: Diagnosis not present

## 2021-05-07 DIAGNOSIS — Z0110 Encounter for examination of ears and hearing without abnormal findings: Secondary | ICD-10-CM | POA: Diagnosis not present

## 2021-05-07 DIAGNOSIS — J449 Chronic obstructive pulmonary disease, unspecified: Secondary | ICD-10-CM | POA: Diagnosis not present

## 2021-05-19 DIAGNOSIS — J9610 Chronic respiratory failure, unspecified whether with hypoxia or hypercapnia: Secondary | ICD-10-CM | POA: Diagnosis not present

## 2021-05-19 DIAGNOSIS — J449 Chronic obstructive pulmonary disease, unspecified: Secondary | ICD-10-CM | POA: Diagnosis not present

## 2021-05-26 DIAGNOSIS — J9611 Chronic respiratory failure with hypoxia: Secondary | ICD-10-CM | POA: Diagnosis not present

## 2021-05-27 DIAGNOSIS — J449 Chronic obstructive pulmonary disease, unspecified: Secondary | ICD-10-CM | POA: Diagnosis not present

## 2021-06-02 ENCOUNTER — Other Ambulatory Visit: Payer: Self-pay

## 2021-06-02 ENCOUNTER — Ambulatory Visit (INDEPENDENT_AMBULATORY_CARE_PROVIDER_SITE_OTHER): Payer: Medicare Other | Admitting: *Deleted

## 2021-06-02 DIAGNOSIS — Z0000 Encounter for general adult medical examination without abnormal findings: Secondary | ICD-10-CM | POA: Diagnosis not present

## 2021-06-02 NOTE — Patient Instructions (Signed)
[name] , Thank you for taking time to come for your Medicare Wellness Visit. I appreciate your ongoing commitment to your health goals. Please review the following plan we discussed and let me know if I can assist you in the future.   Screening recommendations/referrals: Colonoscopy: Declines referral today Recommended yearly ophthalmology/optometry visit for glaucoma screening and checkup Recommended yearly dental visit for hygiene and checkup  Vaccinations: Influenza vaccine: Due now Pneumococcal vaccine: Completed Tdap vaccine: Due now Shingles vaccine: Due now    Advanced directives: Copy in chart   Next appointment: 1 year   Preventive Care 40-64 Years, Male Preventive care refers to lifestyle choices and visits with your health care provider that can promote health and wellness. What does preventive care include? A yearly physical exam. This is also called an annual well check. Dental exams once or twice a year. Routine eye exams. Ask your health care provider how often you should have your eyes checked. Personal lifestyle choices, including: Daily care of your teeth and gums. Regular physical activity. Eating a healthy diet. Avoiding tobacco and drug use. Limiting alcohol use. Practicing safe sex. Taking low-dose aspirin every day starting at age 35. What happens during an annual well check? The services and screenings done by your health care provider during your annual well check will depend on your age, overall health, lifestyle risk factors, and family history of disease. Counseling  Your health care provider may ask you questions about your: Alcohol use. Tobacco use. Drug use. Emotional well-being. Home and relationship well-being. Sexual activity. Eating habits. Work and work Astronomer. Screening  You may have the following tests or measurements: Height, weight, and BMI. Blood pressure. Lipid and cholesterol levels. These may be checked every 5  years, or more frequently if you are over 30 years old. Skin check. Lung cancer screening. You may have this screening every year starting at age 64 if you have a 30-pack-year history of smoking and currently smoke or have quit within the past 15 years. Fecal occult blood test (FOBT) of the stool. You may have this test every year starting at age 53. Flexible sigmoidoscopy or colonoscopy. You may have a sigmoidoscopy every 5 years or a colonoscopy every 10 years starting at age 71. Prostate cancer screening. Recommendations will vary depending on your family history and other risks. Hepatitis C blood test. Hepatitis B blood test. Sexually transmitted disease (STD) testing. Diabetes screening. This is done by checking your blood sugar (glucose) after you have not eaten for a while (fasting). You may have this done every 1-3 years. Discuss your test results, treatment options, and if necessary, the need for more tests with your health care provider. Vaccines  Your health care provider may recommend certain vaccines, such as: Influenza vaccine. This is recommended every year. Tetanus, diphtheria, and acellular pertussis (Tdap, Td) vaccine. You may need a Td booster every 10 years. Zoster vaccine. You may need this after age 54. Pneumococcal 13-valent conjugate (PCV13) vaccine. You may need this if you have certain conditions and have not been vaccinated. Pneumococcal polysaccharide (PPSV23) vaccine. You may need one or two doses if you smoke cigarettes or if you have certain conditions. Talk to your health care provider about which screenings and vaccines you need and how often you need them. This information is not intended to replace advice given to you by your health care provider. Make sure you discuss any questions you have with your health care provider. Document Released: 10/06/2015 Document Revised: 05/29/2016 Document  Reviewed: 07/11/2015 Elsevier Interactive Patient Education  2017  [employer].  Fall Prevention in the Home Falls can cause injuries. They can happen to people of all ages. There are many things you can do to make your home safe and to help prevent falls. What can I do on the outside of my home? Regularly fix the edges of walkways and driveways and fix any cracks. Remove anything that might make you trip as you walk through a door, such as a raised step or threshold. Trim any bushes or trees on the path to your home. Use bright outdoor lighting. Clear any walking paths of anything that might make someone trip, such as rocks or tools. Regularly check to see if handrails are loose or broken. Make sure that both sides of any steps have handrails. Any raised decks and porches should have guardrails on the edges. Have any leaves, snow, or ice cleared regularly. Use sand or salt on walking paths during winter. Clean up any spills in your garage right away. This includes oil or grease spills. What can I do in the bathroom? Use night lights. Install grab bars by the toilet and in the tub and shower. Do not use towel bars as grab bars. Use non-skid mats or decals in the tub or shower. If you need to sit down in the shower, use a plastic, non-slip stool. Keep the floor dry. Clean up any water that spills on the floor as soon as it happens. Remove soap buildup in the tub or shower regularly. Attach bath mats securely with double-sided non-slip rug tape. Do not have throw rugs and other things on the floor that can make you trip. What can I do in the bedroom? Use night lights. Make sure that you have a light by your bed that is easy to reach. Do not use any sheets or blankets that are too big for your bed. They should not hang down onto the floor. Have a firm chair that has side arms. You can use this for support while you get dressed. Do not have throw rugs and other things on the floor that can make you trip. What can I do in the kitchen? Clean up any spills  right away. Avoid walking on wet floors. Keep items that you use a lot in easy-to-reach places. If you need to reach something above you, use a strong step stool that has a grab bar. Keep electrical cords out of the way. Do not use floor polish or wax that makes floors slippery. If you must use wax, use non-skid floor wax. Do not have throw rugs and other things on the floor that can make you trip. What can I do with my stairs? Do not leave any items on the stairs. Make sure that there are handrails on both sides of the stairs and use them. Fix handrails that are broken or loose. Make sure that handrails are as long as the stairways. Check any carpeting to make sure that it is firmly attached to the stairs. Fix any carpet that is loose or worn. Avoid having throw rugs at the top or bottom of the stairs. If you do have throw rugs, attach them to the floor with carpet tape. Make sure that you have a light switch at the top of the stairs and the bottom of the stairs. If you do not have them, ask someone to add them for you. What else can I do to help prevent falls? Wear shoes that: Do  not have high heels. Have rubber bottoms. Are comfortable and fit you well. Are closed at the toe. Do not wear sandals. If you use a stepladder: Make sure that it is fully opened. Do not climb a closed stepladder. Make sure that both sides of the stepladder are locked into place. Ask someone to hold it for you, if possible. Clearly mark and make sure that you can see: Any grab bars or handrails. First and last steps. Where the edge of each step is. Use tools that help you move around (mobility aids) if they are needed. These include: Canes. Walkers. Scooters. Crutches. Turn on the lights when you go into a dark area. Replace any light bulbs as soon as they burn out. Set up your furniture so you have a clear path. Avoid moving your furniture around. If any of your floors are uneven, fix them. If there are  any pets around you, be aware of where they are. Review your medicines with your doctor. Some medicines can make you feel dizzy. This can increase your chance of falling. Ask your doctor what other things that you can do to help prevent falls. This information is not intended to replace advice given to you by your health care provider. Make sure you discuss any questions you have with your health care provider. Document Released: 07/06/2009 Document Revised: 02/15/2016 Document Reviewed: 10/14/2014 Elsevier Interactive Patient Education  2017 [employer].

## 2021-06-02 NOTE — Progress Notes (Signed)
Subjective:   John [name] is a 65 y.o. male who presents for Medicare Annual/Subsequent preventive examination.  I connected with  John [name] on 06/02/21 by an audio enabled telemedicine application and verified that I am speaking with the correct person using two identifiers.   I discussed the limitations, risks, security and privacy concerns of performing an evaluation and management service by telephone and the availability of in person appointments. I also discussed with the patient that there may be a patient responsible charge related to this service. The patient expressed understanding and verbally consented to this telephonic visit.   Review of Systems           Objective:    There were no vitals filed for this visit. There is no height or weight on file to calculate BMI.  Advanced Directives 02/28/2021 02/12/2021 12/20/2020 04/18/2020 11/23/2019 09/28/2014 03/29/2012  Does Patient Have a Medical Advance Directive? No No No Yes No No Patient does not have advance directive  Type of Advance Directive - - - Healthcare Power of Attorney - - -  Does patient want to make changes to medical advance directive? - - - No - Patient declined - - -  Copy of Healthcare Power of Attorney in Chart? - - - No - copy requested - - -  Would patient like information on creating a medical advance directive? Yes (MAU/Ambulatory/Procedural Areas - Information given) - No - Patient declined - No - Patient declined No - patient declined information -  Pre-existing out of facility DNR order (yellow form or pink MOST form) - - - - - - No    Current Medications (verified) Outpatient Encounter Medications as of 06/02/2021  Medication Sig   albuterol (PROAIR HFA) 108 (90 Base) MCG/ACT inhaler 2 puffs every 4 hours as needed only  if your can't catch your breath   albuterol (PROVENTIL) (2.5 MG/3ML) 0.083% nebulizer solution Take 3 mLs (2.5 mg total) by nebulization every 4 (four) hours as needed for  wheezing or shortness of breath.   aspirin EC 81 MG tablet Take 81 mg by mouth daily.   atorvastatin (LIPITOR) 40 MG tablet Take 1 tablet (40 mg total) by mouth daily.   Budeson-Glycopyrrol-Formoterol (BREZTRI AEROSPHERE) 160-9-4.8 MCG/ACT AERO Inhale 2 puffs into the lungs 2 (two) times daily.   furosemide (LASIX) 40 MG tablet Take 1 tablet (40 mg total) by mouth daily.   guaiFENesin (MUCINEX) 600 MG 12 hr tablet Take 2 tablets (1,200 mg total) by mouth 2 (two) times daily as needed for cough or to loosen phlegm.   Multiple Vitamin (MULTIVITAMIN WITH MINERALS) TABS tablet Take 1 tablet by mouth daily.   nitroGLYCERIN (NITROSTAT) 0.4 MG SL tablet Place 1 tablet (0.4 mg total) under the tongue every 5 (five) minutes as needed for chest pain.   OXYGEN Inhale 2-3 L into the lungs.   traZODone (DESYREL) 50 MG tablet Take 1 tablet (50 mg total) by mouth at bedtime as needed for sleep.   No facility-administered encounter medications on file as of 06/02/2021.    Allergies (verified) Penicillins   History: Past Medical History:  Diagnosis Date   Alcohol abuse 12/27/2010   Allergic rhinitis, cause unspecified 12/27/2010   Anal warts 04/11/2012   CAD, NATIVE VESSEL 10/04/2010   COPD (chronic obstructive pulmonary disease) (HCC)    "a touch" (09/28/2013)   History of blood transfusion 03/2012   related to nose bleed   HYPERLIPIDEMIA-MIXED 10/04/2010   HYPERTENSION, BENIGN 10/04/2010  Myocardial infarction (HCC) 09/16/2010   Pneumonia    "when I was a kid"   Stented coronary artery    Mid LAD    TOBACCO ABUSE 10/04/2010   Past Surgical History:  Procedure Laterality Date   CARDIAC CATHETERIZATION  ~ 2012   CORONARY ANGIOPLASTY WITH STENT PLACEMENT  09/17/2010; 09/28/2014   "1; 2"   FINGER SURGERY Left    "almost cut off" tip of 2nd digit   LEFT HEART CATHETERIZATION WITH CORONARY ANGIOGRAM N/A 09/28/2014   Procedure: LEFT HEART CATHETERIZATION WITH CORONARY ANGIOGRAM;  Surgeon: Kathleene Hazel, MD;  Location: [facility] C[facility] LAB;  Service: Cardiovascular;  Laterality: N/A;   NASAL ENDOSCOPY WITH EPISTAXIS CONTROL Bilateral 03/2012   VASECTOMY     Family History  Problem Relation Age of Onset   Brain cancer Mother    Heart disease Father    Social History   Socioeconomic History   Marital status: Single    Spouse name: Not on file   Number of children: Not on file   Years of education: Not on file   Highest education level: Not on file  Occupational History   Occupation: Lobbyist maintenance work     Employer: [employer]    Comment: [employer]ette [employer]  Tobacco Use   Smoking status: Former    Packs/day: 2.00    Years: 50.00    Pack years: 100.00    Types: Cigarettes    Start date: 1970    Quit date: 04/18/2020    Years since quitting: 1.1   Smokeless tobacco: Never   Tobacco comments:    2 plus packs per day. He has a 100 + pack - year history of tobacco abuse currently. Former 4 ppd for 25 years.  Vaping Use   Vaping Use: Never used  Substance and Sexual Activity   Alcohol use: Yes    Alcohol/week: 144.0 standard drinks    Types: 144 Cans of beer per week    Comment:  history 12 pack of beer per night".  2021- drinks 3 beer/day   Drug use: No   Sexual activity: Yes    Birth control/protection: None  Other Topics Concern   Not on file  Social History Narrative   The patient lives in [place] with his girlfriend. 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   Social Determinants of Health   Financial Resource Strain: Not on file  Food Insecurity: Not on file  Transportation Needs: Not on file  Physical Activity: Not on file  Stress: Not on file  Social Connections: Not on file    Tobacco Counseling Counseling given: Not Answered Tobacco comments: 2 plus packs per day. He has a 100 + pack - year history of tobacco abuse currently. Former 4 ppd for 25 years.   Clinical Intake:                 Diabetic?No          Activities of Daily Living No flowsheet data found.  Patient Care Team: Anabel Halon, MD as PCP - General (Internal Medicine) Kathleene Hazel, MD as PCP - Cardiology (Cardiology) Jena Gauss Gerrit Friends, MD as Consulting Physician (Gastroenterology)  Indicate any recent Medical Services you may have received from other than [facility] providers in the past year (date may be approximate).     Assessment:   This is a routine wellness examination for Aaron.  Hearing/Vision screen No results found.  Dietary  issues and exercise activities discussed:     Goals Addressed   None   Depression Screen PHQ 2/9 Scores 01/24/2021  PHQ - 2 Score 0    Fall Risk Fall Risk  01/24/2021  Falls in the past year? 0  Number falls in past yr: 0  Injury with Fall? 0  Risk for fall due to : No Fall Risks  Follow up Falls evaluation completed    FALL RISK PREVENTION PERTAINING TO THE HOME:  Any stairs in or around the home? No  If so, are there any without handrails? No  Home free of loose throw rugs in walkways, pet beds, electrical cords, etc? Yes  Adequate lighting in your home to reduce risk of falls? Yes   ASSISTIVE DEVICES UTILIZED TO PREVENT FALLS:  Life alert? No  Use of a cane, walker or w/c? No  Grab bars in the bathroom? No  Shower chair or bench in shower? No  Elevated toilet seat or a handicapped toilet? No   TIMED UP AND GO:  Was the test performed? No .  Length of time to ambulate 10 feet: NA sec.     Cognitive Function:        Immunizations Immunization History  Administered Date(s) Administered   Influenza,inj,Quad PF,6+ Mos 07/05/2020   Moderna Sars-Covid-2 Vaccination 12/15/2019, 01/12/2020, 08/15/2020   Pneumococcal Polysaccharide-23 10/11/2020    TDAP status: Due, Education has been provided regarding the importance of this vaccine. Advised may receive this vaccine at local pharmacy or Health Dept. Aware to provide a copy of the vaccination record if  obtained from local pharmacy or Health Dept. Verbalized acceptance and understanding.  Flu Vaccine status: Due, Education has been provided regarding the importance of this vaccine. Advised may receive this vaccine at local pharmacy or Health Dept. Aware to provide a copy of the vaccination record if obtained from local pharmacy or Health Dept. Verbalized acceptance and understanding.  Pneumococcal vaccine status: Up to date  Covid-19 vaccine status: Completed vaccines  Qualifies for Shingles Vaccine? Yes   Zostavax completed No   Shingrix Completed?: No.    Education has been provided regarding the importance of this vaccine. Patient has been advised to call insurance company to determine out of pocket expense if they have not yet received this vaccine. Advised may also receive vaccine at local pharmacy or Health Dept. Verbalized acceptance and understanding.  Screening Tests Health Maintenance  Topic Date Due   Hepatitis C Screening  Never done   Zoster Vaccines- Shingrix (1 of 2) Never done   TETANUS/TDAP  09/23/2013   COVID-19 Vaccine (4 - Booster for Moderna series) 11/07/2020   COLONOSCOPY (Pts 45-32yrs Insurance coverage will need to be confirmed)  12/26/2020   INFLUENZA VACCINE  04/23/2021   Pneumococcal Vaccine 109-43 Years old (2 - PCV) 10/11/2021   HIV Screening  Completed   HPV VACCINES  Aged Out    Health Maintenance  Health Maintenance Due  Topic Date Due   Hepatitis C Screening  Never done   Zoster Vaccines- Shingrix (1 of 2) Never done   TETANUS/TDAP  09/23/2013   COVID-19 Vaccine (4 - Booster for Moderna series) 11/07/2020   COLONOSCOPY (Pts 45-77yrs Insurance coverage will need to be confirmed)  12/26/2020   INFLUENZA VACCINE  04/23/2021     Lung Cancer Screening: (Low Dose CT Chest recommended if Age 73-80 years, 30 pack-year currently smoking OR have quit w/in 15years.) does qualify.   Lung Cancer Screening Referral: Already had done  Additional  Screening:  Hepatitis C Screening: does qualify; Completed   Vision Screening: Recommended annual ophthalmology exams for early detection of glaucoma and other disorders of the eye. Is the patient up to date with their annual eye exam?  No  Who is the provider or what is the name of the office in which the patient attends annual eye exams? [facility] If pt is not established with a provider, would they like to be referred to a provider to establish care? Yes .   Dental Screening: Recommended annual dental exams for proper oral hygiene  Community Resource Referral / Chronic Care Management: CRR required this visit?  No   CCM required this visit?  No      Plan:     I have personally reviewed and noted the following in the patient's chart:   Medical and social history Use of alcohol, tobacco or illicit drugs  Current medications and supplements including opioid prescriptions. Patient is not currently taking opioid prescriptions. Functional ability and status Nutritional status Physical activity Advanced directives List of other physicians Hospitalizations, surgeries, and ER visits in previous 12 months Vitals Screenings to include cognitive, depression, and falls Referrals and appointments  In addition, I have reviewed and discussed with patient certain preventive protocols, quality metrics, and best practice recommendations. A written personalized care plan for preventive services as well as general preventive health recommendations were provided to patient.     Park Breed, CMA   06/02/2021   Nurse Notes: This was a telehealth visit. The patient was at home. The provider was at home and was Trena Platt, MD.

## 2021-06-06 ENCOUNTER — Other Ambulatory Visit: Payer: Self-pay

## 2021-06-06 ENCOUNTER — Encounter: Payer: Self-pay | Admitting: Internal Medicine

## 2021-06-06 ENCOUNTER — Ambulatory Visit (INDEPENDENT_AMBULATORY_CARE_PROVIDER_SITE_OTHER): Payer: Medicare Other | Admitting: Internal Medicine

## 2021-06-06 VITALS — BP 139/81 | HR 59 | Resp 18 | Ht 69.0 in | Wt 160.1 lb

## 2021-06-06 DIAGNOSIS — J9611 Chronic respiratory failure with hypoxia: Secondary | ICD-10-CM | POA: Diagnosis not present

## 2021-06-06 DIAGNOSIS — I714 Abdominal aortic aneurysm, without rupture, unspecified: Secondary | ICD-10-CM | POA: Insufficient documentation

## 2021-06-06 DIAGNOSIS — J449 Chronic obstructive pulmonary disease, unspecified: Secondary | ICD-10-CM | POA: Diagnosis not present

## 2021-06-06 DIAGNOSIS — I2510 Atherosclerotic heart disease of native coronary artery without angina pectoris: Secondary | ICD-10-CM | POA: Diagnosis not present

## 2021-06-06 DIAGNOSIS — J9612 Chronic respiratory failure with hypercapnia: Secondary | ICD-10-CM

## 2021-06-06 NOTE — Patient Instructions (Signed)
Please continue current medications as prescribed.  Please contact us if you have any fever, chills, worsening of cough or shortness of breath.

## 2021-06-06 NOTE — Assessment & Plan Note (Signed)
S/p stent placement in 2011 and 2015 On aspirin and statin Did not tolerate beta-blocker in the past Follows up with cardiology 

## 2021-06-06 NOTE — Assessment & Plan Note (Signed)
On 3 L O2 at home Due to underlying COPD On Breztri Albuterol nebs and inhaler as needed Follows up with Dr. Sood Had evaluation by transplant team, not a surgical candidate 

## 2021-06-06 NOTE — Progress Notes (Signed)
Established Patient Office Visit  Subjective:  Patient ID: John [name], male    DOB: December 04, 1955  Age: 65 y.o. MRN: [id number]  CC:  Chief Complaint  Patient presents with   Follow-up    4 month follow up    HPI John [name] is 65 year old male with past medical history of CAD s/p stent placement, chronic hypoxic respiratory failure due to COPD and chronic leg edema who presents for follow up of his chronic medical conditions.  He has been evaluated by [facility] transplant team and Vascular surgery team, and is not deemed to be a surgical candidate for lung transplant and AAA repair. He has baseline chronic cough and dyspnea, for which he has Breztri, PRN Albuterol and PRN Mucinex. Denies any fever, chills or sore throat.  He has h/o CAD and follows up with Cardiology. BP is well-controlled. Takes medications regularly. Patient denies headache, dizziness, chest pain, or palpitations.  He prefers to wait for flu vaccine for now.  Past Medical History:  Diagnosis Date   Alcohol abuse 12/27/2010   Allergic rhinitis, cause unspecified 12/27/2010   Anal warts 04/11/2012   CAD, NATIVE VESSEL 10/04/2010   COPD (chronic obstructive pulmonary disease) (HCC)    "a touch" (09/28/2013)   History of blood transfusion 03/2012   related to nose bleed   HYPERLIPIDEMIA-MIXED 10/04/2010   HYPERTENSION, BENIGN 10/04/2010   Myocardial infarction (HCC) 09/16/2010   Pneumonia    "when I was a kid"   Stented coronary artery    Mid LAD    TOBACCO ABUSE 10/04/2010    Past Surgical History:  Procedure Laterality Date   CARDIAC CATHETERIZATION  ~ 2012   CORONARY ANGIOPLASTY WITH STENT PLACEMENT  09/17/2010; 09/28/2014   "1; 2"   FINGER SURGERY Left    "almost cut off" tip of 2nd digit   LEFT HEART CATHETERIZATION WITH CORONARY ANGIOGRAM N/A 09/28/2014   Procedure: LEFT HEART CATHETERIZATION WITH CORONARY ANGIOGRAM;  Surgeon: Kathleene Hazel, MD;  Location: [facility] CATH LAB;  Service:  Cardiovascular;  Laterality: N/A;   NASAL ENDOSCOPY WITH EPISTAXIS CONTROL Bilateral 03/2012   VASECTOMY      Family History  Problem Relation Age of Onset   Brain cancer Mother    Heart disease Father     Social History   Socioeconomic History   Marital status: Single    Spouse name: Not on file   Number of children: Not on file   Years of education: Not on file   Highest education level: Not on file  Occupational History   Occupation: Lobbyist maintenance work     Employer: [employer]    Comment: [employer]  Tobacco Use   Smoking status: Former    Packs/day: 2.00    Years: 50.00    Pack years: 100.00    Types: Cigarettes    Start date: 1970    Quit date: 04/18/2020    Years since quitting: 1.1   Smokeless tobacco: Never   Tobacco comments:    2 plus packs per day. He has a 100 + pack - year history of tobacco abuse currently. Former 4 ppd for 25 years.  Vaping Use   Vaping Use: Never used  Substance and Sexual Activity   Alcohol use: Yes    Alcohol/week: 144.0 standard drinks    Types: 144 Cans of beer per week    Comment:  history 12 pack of beer per night".  2021- drinks 3 beer/day   Drug use: No  Sexual activity: Yes    Birth control/protection: None  Other Topics Concern   Not on file  Social History Narrative   The patient lives in [place] with his girlfriend. 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   Social Determinants of Health   Financial Resource Strain: Low Risk    Difficulty of Paying Living Expenses: Not hard at all  Food Insecurity: No Food Insecurity   Worried About Programme researcher, broadcasting/film/video in the Last Year: Never true   Ran Out of Food in the Last Year: Never true  Transportation Needs: No Transportation Needs   Lack of Transportation (Medical): No   Lack of Transportation (Non-Medical): No  Physical Activity: Inactive   Days of Exercise per Week: 0 days   Minutes of Exercise per Session: 0 min  Stress: No  Stress Concern Present   Feeling of Stress : Not at all  Social Connections: Socially Isolated   Frequency of Communication with Friends and Family: More than three times a week   Frequency of Social Gatherings with Friends and Family: More than three times a week   Attends Religious Services: Never   Database administrator or Organizations: No   Attends Engineer, structural: Never   Marital Status: Divorced  Catering manager Violence: Not At Risk   Fear of Current or Ex-Partner: No   Emotionally Abused: No   Physically Abused: No   Sexually Abused: No    Outpatient Medications Prior to Visit  Medication Sig Dispense Refill   albuterol (PROAIR HFA) 108 (90 Base) MCG/ACT inhaler 2 puffs every 4 hours as needed only  if your can't catch your breath 18 g 11   albuterol (PROVENTIL) (2.5 MG/3ML) 0.083% nebulizer solution Take 3 mLs (2.5 mg total) by nebulization every 4 (four) hours as needed for wheezing or shortness of breath. 360 mL 5   aspirin EC 81 MG tablet Take 81 mg by mouth daily.     atorvastatin (LIPITOR) 40 MG tablet Take 1 tablet (40 mg total) by mouth daily. 90 tablet 3   Budeson-Glycopyrrol-Formoterol (BREZTRI AEROSPHERE) 160-9-4.8 MCG/ACT AERO Inhale 2 puffs into the lungs 2 (two) times daily. 32.37 g 3   ezetimibe (ZETIA) 10 MG tablet Take 10 mg by mouth daily.     furosemide (LASIX) 40 MG tablet Take 1 tablet (40 mg total) by mouth daily. 90 tablet 3   guaiFENesin (MUCINEX) 600 MG 12 hr tablet Take 2 tablets (1,200 mg total) by mouth 2 (two) times daily as needed for cough or to loosen phlegm.     Multiple Vitamin (MULTIVITAMIN WITH MINERALS) TABS tablet Take 1 tablet by mouth daily.     nitroGLYCERIN (NITROSTAT) 0.4 MG SL tablet Place 1 tablet (0.4 mg total) under the tongue every 5 (five) minutes as needed for chest pain. 25 tablet 6   OXYGEN Inhale 2-3 L into the lungs.     traZODone (DESYREL) 50 MG tablet Take 1 tablet (50 mg total) by mouth at bedtime as needed  for sleep. 30 tablet 1   No facility-administered medications prior to visit.    Allergies  Allergen Reactions   Penicillins Other (See Comments)    Passes out    ROS Review of Systems  Constitutional:  Negative for chills and fever.  HENT:  Negative for congestion and sore throat.   Eyes:  Negative for pain and discharge.  Respiratory:  Positive for cough and shortness of breath (  Chronic).   Cardiovascular:  Negative for chest pain and palpitations.  Gastrointestinal:  Negative for constipation, diarrhea, nausea and vomiting.  Endocrine: Negative for polydipsia and polyuria.  Genitourinary:  Negative for dysuria and hematuria.  Musculoskeletal:  Negative for neck pain and neck stiffness.  Skin:  Negative for rash.  Neurological:  Negative for dizziness, weakness, numbness and headaches.  Psychiatric/Behavioral:  Negative for agitation and behavioral problems.      Objective:    Physical Exam Vitals reviewed.  Constitutional:      General: He is not in acute distress.    Appearance: He is not diaphoretic.  HENT:     Head: Normocephalic and atraumatic.     Nose: Nose normal.     Mouth/Throat:     Mouth: Mucous membranes are moist.  Eyes:     General: No scleral icterus.    Extraocular Movements: Extraocular movements intact.  Cardiovascular:     Rate and Rhythm: Normal rate and regular rhythm.     Pulses: Normal pulses.     Heart sounds: Normal heart sounds. No murmur heard. Pulmonary:     Breath sounds: Normal breath sounds. No wheezing or rales.     Comments: On 3 l O2 Musculoskeletal:     Cervical back: Neck supple. No tenderness.     Right lower leg: No edema.     Left lower leg: No edema.  Skin:    General: Skin is warm.     Findings: No rash.  Neurological:     General: No focal deficit present.     Mental Status: He is alert and oriented to person, place, and time.  Psychiatric:        Mood and Affect: Mood normal.        Behavior: Behavior normal.     BP 139/81 (BP Location: Left Arm, Patient Position: Sitting, Cuff Size: Normal)   Pulse (!) 59   Resp 18   Ht 5\' 9"  (1.753 m)   Wt 160 lb 1.9 oz (72.6 kg)   SpO2 95%   BMI 23.65 kg/m  Wt Readings from Last 3 Encounters:  06/06/21 160 lb 1.9 oz (72.6 kg)  02/28/21 160 lb 9.6 oz (72.8 kg)  02/12/21 164 lb 6.4 oz (74.6 kg)     Health Maintenance Due  Topic Date Due   Hepatitis C Screening  Never done   Zoster Vaccines- Shingrix (1 of 2) Never done   TETANUS/TDAP  09/23/2013   COVID-19 Vaccine (4 - Booster for Moderna series) 11/07/2020   COLONOSCOPY (Pts 45-45yrs Insurance coverage will need to be confirmed)  12/26/2020   INFLUENZA VACCINE  04/23/2021    There are no preventive care reminders to display for this patient.  Lab Results  Component Value Date   TSH 0.688 04/19/2020   Lab Results  Component Value Date   WBC 7.9 02/12/2021   HGB 13.0 02/12/2021   HCT 40.6 02/12/2021   MCV 103.3 (H) 02/12/2021   PLT 209 02/12/2021   Lab Results  Component Value Date   NA 135 02/12/2021   K 4.0 02/12/2021   CO2 39 (H) 02/12/2021   GLUCOSE 142 (H) 02/12/2021   BUN 12 02/12/2021   CREATININE 1.02 02/12/2021   BILITOT 0.3 10/10/2020   ALKPHOS 122 10/10/2020   AST 26 10/10/2020   ALT 23 10/10/2020   PROT 6.9 10/10/2020   ALBUMIN 3.6 10/10/2020   CALCIUM 9.1 02/12/2021   ANIONGAP 7 02/12/2021   GFR 92.34 06/14/2020  Lab Results  Component Value Date   CHOL 174 10/10/2020   Lab Results  Component Value Date   HDL 76 10/10/2020   Lab Results  Component Value Date   LDLCALC 86 10/10/2020   Lab Results  Component Value Date   TRIG 60 10/10/2020   Lab Results  Component Value Date   CHOLHDL 2.3 10/10/2020   Lab Results  Component Value Date   HGBA1C 5.1 04/24/2020      Assessment & Plan:   Problem List Items Addressed This Visit       Cardiovascular and Mediastinum   CAD, NATIVE VESSEL - Primary    S/p stent placement in 2011 and 2015 On  aspirin and statin Did not tolerate beta-blocker in the past Follows up with cardiology      Abdominal aortic aneurysm (AAA) without rupture (HCC)    About 6.9 cm infrarenal AAA Was evaluated by [facility] Vascular surgery and is not a surgical candidate due to high perioperative risk in the setting of his chronic respiratory failure Patient understands risk of unrepaired AAA (rupture)        Respiratory   Chronic respiratory failure with hypoxia and hypercapnia (HCC)    On 3 L O2 at home Due to underlying COPD On Breztri Albuterol nebs and inhaler as needed Follows up with Dr. Craige Cotta Had evaluation by transplant team, not a surgical candidate      Chronic obstructive pulmonary disease (HCC)    Well-controlled currently with Breztri Albuterol PRN F/u with Pulmonology      Chart from [facility] transplant team, Vascular surgery and Geriatrics reviewed.  No orders of the defined types were placed in this encounter.   Follow-up: Return in about 4 months (around 10/06/2021) for COPD, CAD.    Anabel Halon, MD

## 2021-06-06 NOTE — Assessment & Plan Note (Signed)
About 6.9 cm infrarenal AAA Was evaluated by [facility] Vascular surgery and is not a surgical candidate due to high perioperative risk in the setting of his chronic respiratory failure Patient understands risk of unrepaired AAA (rupture) 

## 2021-06-06 NOTE — Assessment & Plan Note (Signed)
Well-controlled currently with Breztri Albuterol PRN F/u with Pulmonology 

## 2021-06-11 ENCOUNTER — Other Ambulatory Visit: Payer: Self-pay | Admitting: Pulmonary Disease

## 2021-06-12 NOTE — Telephone Encounter (Signed)
Dr. Sood, please advise if you are okay refilling pt's med. 

## 2021-06-13 ENCOUNTER — Ambulatory Visit (INDEPENDENT_AMBULATORY_CARE_PROVIDER_SITE_OTHER): Payer: Medicare Other | Admitting: Pulmonary Disease

## 2021-06-13 ENCOUNTER — Other Ambulatory Visit: Payer: Self-pay

## 2021-06-13 ENCOUNTER — Encounter: Payer: Self-pay | Admitting: Pulmonary Disease

## 2021-06-13 VITALS — BP 124/68 | HR 113 | Temp 97.7°F | Ht 69.0 in | Wt 159.8 lb

## 2021-06-13 DIAGNOSIS — I714 Abdominal aortic aneurysm, without rupture: Secondary | ICD-10-CM

## 2021-06-13 DIAGNOSIS — J432 Centrilobular emphysema: Secondary | ICD-10-CM | POA: Diagnosis not present

## 2021-06-13 DIAGNOSIS — E8801 Alpha-1-antitrypsin deficiency: Secondary | ICD-10-CM

## 2021-06-13 DIAGNOSIS — J9611 Chronic respiratory failure with hypoxia: Secondary | ICD-10-CM

## 2021-06-13 DIAGNOSIS — I2510 Atherosclerotic heart disease of native coronary artery without angina pectoris: Secondary | ICD-10-CM

## 2021-06-13 DIAGNOSIS — R911 Solitary pulmonary nodule: Secondary | ICD-10-CM

## 2021-06-13 DIAGNOSIS — G4700 Insomnia, unspecified: Secondary | ICD-10-CM

## 2021-06-13 DIAGNOSIS — J449 Chronic obstructive pulmonary disease, unspecified: Secondary | ICD-10-CM

## 2021-06-13 DIAGNOSIS — J9612 Chronic respiratory failure with hypercapnia: Secondary | ICD-10-CM

## 2021-06-13 NOTE — Progress Notes (Signed)
[facility]  Chief Complaint  Patient presents with   Follow-up    Sob-same, occass. wheezing    Constitutional:  BP 124/68 (BP Location: Left Arm, Cuff Size: Normal)   Pulse (!) 113   Temp 97.7 F (36.5 C) (Temporal)   Ht 5\' 9"  (1.753 m)   Wt 159 lb 12.8 oz (72.5 kg)   SpO2 95%   BMI 23.60 kg/m   Past Medical History:  PNA, HTN, HLD, CAD, Seizure, ETOH, infrarenal abdominal aortic aneurysm  Past Surgical History:  His  has a past surgical history that includes Finger surgery (Left); Vasectomy; Coronary angioplasty with stent (09/17/2010; 09/28/2014); Cardiac catheterization (~ 2012); Nasal endoscopy with epistaxis control (Bilateral, 03/2012); and left heart catheterization with coronary angiogram (N/A, 09/28/2014).  Brief Summary:  John [name] is a 65 y.o. 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      Subjective:   He is here with his wife.  He was seen at [facility].  After lengthy evaluation he was told he isn't a candidate for lung transplant because he has an aneurysm and coronary artery disease.  He was told it would be best not to pursue correcting these because risk is too high from any procedures.  He hasn't been using NIMV consistently.  He wasn't sure how this was helping him.  He uses 3 liters oxygen.    He isn't having much cough.  Has occasional wheeze and chest congestion.  He is not able to do much activity.  Physical Exam:   Appearance - thin, sitting in a wheelchair, wearing oxygen  ENMT - no sinus tenderness, no oral exudate, no LAN, Mallampati 2 airway, no stridor  Respiratory - decreased breath sounds bilaterally, no wheezing or rales  CV - s1s2 regular rate and rhythm, no murmurs  Ext - no clubbing, no edema  Skin - no rashes  Psych - normal mood and affect    Pulmonary testing:  PFT 12/09/19 >> FEV1 .059  (16%), FEV1% 37, DLCO 27% A1AT 03/09/20 >> 110, MZ ABG 02/12/21 >> pH 7.28, PCO2 80.9, PO2 109 on 40% FiO2 PFT 04/11/21 >> FEV1 0.6 (18%), FEV1% 26, DLCO 21%  Chest Imaging:  CT angio chest 11/23/19 >> moderate centrilobular emphysema, mild infrahilar BTX, 6 mm nodule LUL CT chest 01/02/21 >> no change LUL nodule, new 1.1 cm LLL opacity CT chest 03/28/21 >> severe emphysema, 2.2 x 0.8 cm band like opacity RLL from ATX, mucus plugging, 3 mm nodule RUL, 1.6 x 0.7 cm nodularity LUL CT chest 04/09/21 >> diffuse centrilobular emphysema, stable nodule LUL, 8 mm nodule RLL  Cardiac Tests:  Echo 04/19/20 >> EF 65 to 70%  Social History:  He  reports that he quit smoking about 13 months ago. His smoking use included cigarettes. He started smoking about 52 years ago. He has a 100.00 pack-year smoking history. He has never used smokeless tobacco. He reports current alcohol use of about 144.0 standard drinks per week. He reports that he does not use drugs.  Family History:  His family history includes Brain cancer in his mother; Heart disease in his father.     Assessment/Plan:   Severe COPD from emphysema. - he had assessment at [facility] in July 2022 and informed he wasn't a candidate for lung transplant due to coronary artery disease and infrarenal abdominal aortic aneurysm - continue breztri with spacer - prn albuterol - he  will get COVID and flu vaccines in October  MZ alpha 1 antitrypsin heterozygote. - his level is sufficient that he does require augmentation therapy  Chronic respiratory failure with hypoxia and hypercapnia. - he uses [employer] for his DME - goal SpO2 > 90% - continue 3 liters oxygen 24/7 - discussed the role of NIMV and how this can help him; he will try to use more regular during the night and prn during the day  Lung nodule. - follow up CT chest in July 2023  Insomnia. - continue trazodone 50 mg qhs  Coronary artery disease. - he is followed by Dr. Earney Hamburg with [facility]  Infrarenal abdominal aortic aneurysm. - 6.9 x 6.6 cm on CT abd/pelvis from [facility] in July 2022 - he was advised by surgery at [facility] to pursue conservative management for this  Goals of care. - had lengthy discussion with him and his wife - he was clear he would not want intubation or cardiac resuscitation in the event of cardiac arrest - he is okay to follow up CT imaging, and continue NIMV and supplemental oxygen - he is concerned about dying in the hospital - will arrange for referral to palliative care to help with conversation about goals of care and symptom management; discussed the difference between palliative care and hospice care  Time Spent Involved in Patient Care on Day of Examination:  44 minutes  Follow up:   Patient Instructions  Will arrange for palliative care referral in [place]  Will schedule CT chest for July 2023  Follow up in [place] office in 4 months  Medication List:   Allergies as of 06/13/2021       Reactions   Penicillins Other (See Comments)   Passes out        Medication List        Accurate as of June 13, 2021  2:45 PM. If you have any questions, ask your nurse or doctor.          albuterol 108 (90 Base) MCG/ACT inhaler Commonly known as: ProAir HFA 2 puffs every 4 hours as needed only  if your can't catch your breath   albuterol (2.5 MG/3ML) 0.083% nebulizer solution Commonly known as: PROVENTIL Take 3 mLs (2.5 mg total) by nebulization every 4 (four) hours as needed for wheezing or shortness of breath.   aspirin EC 81 MG tablet Take 81 mg by mouth daily.   atorvastatin 40 MG tablet Commonly known as: LIPITOR Take 1 tablet (40 mg total) by mouth daily.   Breztri Aerosphere 160-9-4.8 MCG/ACT Aero Generic drug: Budeson-Glycopyrrol-Formoterol Inhale 2 puffs into the lungs 2 (two) times daily.   ezetimibe 10 MG tablet Commonly known as: ZETIA Take 10 mg by mouth daily.   furosemide 40 MG  tablet Commonly known as: LASIX Take 1 tablet (40 mg total) by mouth daily.   guaiFENesin 600 MG 12 hr tablet Commonly known as: Mucinex Take 2 tablets (1,200 mg total) by mouth 2 (two) times daily as needed for cough or to loosen phlegm.   multivitamin with minerals Tabs tablet Take 1 tablet by mouth daily.   nitroGLYCERIN 0.4 MG SL tablet Commonly known as: Nitrostat Place 1 tablet (0.4 mg total) under the tongue every 5 (five) minutes as needed for chest pain.   OXYGEN Inhale 2-3 L into the lungs.   traZODone 50 MG tablet Commonly known as: DESYREL TAKE 1 TABLET BY MOUTH AT BEDTIME AS NEEDED FOR SLEEP.  Signature:  Coralyn Helling, MD [facility] Pulmonary/Critical Care Pager - [phone] 06/13/2021, 2:45 PM

## 2021-06-13 NOTE — Patient Instructions (Signed)
Will arrange for palliative care referral in [place]  Will schedule CT chest for July 2023  Follow up in [place] office in 4 months

## 2021-06-14 ENCOUNTER — Encounter: Payer: Self-pay | Admitting: Cardiovascular Disease

## 2021-06-14 ENCOUNTER — Ambulatory Visit: Payer: Medicare Other | Admitting: Cardiovascular Disease

## 2021-06-14 VITALS — BP 118/68 | HR 111 | Ht 69.0 in | Wt 159.1 lb

## 2021-06-14 DIAGNOSIS — I714 Abdominal aortic aneurysm, without rupture, unspecified: Secondary | ICD-10-CM

## 2021-06-14 DIAGNOSIS — I2510 Atherosclerotic heart disease of native coronary artery without angina pectoris: Secondary | ICD-10-CM | POA: Diagnosis not present

## 2021-06-14 DIAGNOSIS — I10 Essential (primary) hypertension: Secondary | ICD-10-CM | POA: Diagnosis not present

## 2021-06-14 DIAGNOSIS — E7800 Pure hypercholesterolemia, unspecified: Secondary | ICD-10-CM | POA: Diagnosis not present

## 2021-06-14 DIAGNOSIS — I5032 Chronic diastolic (congestive) heart failure: Secondary | ICD-10-CM

## 2021-06-14 NOTE — Patient Instructions (Signed)

## 2021-06-14 NOTE — Progress Notes (Signed)
Chief Complaint  Patient presents with   Follow-up    CAD     History of Present Illness: 65 yo male with history of CAD , HTN, hyperlipidemia, chronic diastolic CHF and severe COPD who is here today for cardiac follow up. He was admitted to [facility] December 2011 with a NSTEMI. Cardiac cath 09/17/10 and was found to have a severe stenosis of the LAD which was treated with a drug eluting stent. He was also found to have moderate disease in the RCA (70%) and Circumflex (70%) managed medically. Cardiac cath June 2012 with stable CAD. He  had 3 admissions in July 2013 with severe epistaxis causing hemorrhagic shock with tachycardia and drop in blood pressure. This was treated with packing and transfusion. His Plavix was stopped. He was admitted January 2016 with unstable angina. Cardiac cath January 2016 with severe mid LAD stenosis, severe mid Circumflex stenosis. I placed a drug eluting stent in the mid LAD and a drug eluting stent in the mid Circumflex. LV function normal. I saw him in May 2016 and he c/o fatigue and dyspnea. His Coreg was stopped and he had resolution of his symptoms. He missed follow up appointments in 2017-2020 due to lack of insurance and cost. I saw him February 2020 with c/o dyspnea and lower extremity edema. He had been off of all medications due to losing his job and having no money. He responded well to Lasix. He has established in our pulmonary office with Dr. Craige Cotta. He was admitted to [facility] July 2021 with acute respiratory failure secondary to COPD exacerbation and community acquired pneumonia and was intubated. He was euvolemic on admission per notes but developed LE edema during his hospital stay. He was discharged on Lasix 40 mg po BID. Echo 04/19/20 with LVEF=65-70%. No valve disease. He has stopped smoking. He is on supplemental O2 24 hours per day now. He was seen at [facility] July 2022 and told he was not a candidate for lung transplant. Cardiac cath at [facility] July 2022  with 60% mid LAD stenosis, patent LAD stents, patent Circumflex stent. Echo July 2022 at [facility] with normal LV function and no valve disease. CTA abdomen at [facility] with 6.9 cm AAA. He was seen by Vascular surgery there and turned down for intervention. He was told he had 6 months to live and was made DNR.   He is here today for follow up. The patient denies any chest pain, palpitations, lower extremity edema, orthopnea, PND, dizziness, near syncope or syncope. Baseline dyspnea. He is on a scooter today.   Primary Care Physician: Anabel Halon, MD  Past Medical History:  Diagnosis Date   Alcohol abuse 12/27/2010   Allergic rhinitis, cause unspecified 12/27/2010   Anal warts 04/11/2012   CAD, NATIVE VESSEL 10/04/2010   COPD (chronic obstructive pulmonary disease) (HCC)    "a touch" (09/28/2013)   History of blood transfusion 03/2012   related to nose bleed   HYPERLIPIDEMIA-MIXED 10/04/2010   HYPERTENSION, BENIGN 10/04/2010   Myocardial infarction (HCC) 09/16/2010   Pneumonia    "when I was a kid"   Stented coronary artery    Mid LAD    TOBACCO ABUSE 10/04/2010    Past Surgical History:  Procedure Laterality Date   CARDIAC CATHETERIZATION  ~ 2012   CORONARY ANGIOPLASTY WITH STENT PLACEMENT  09/17/2010; 09/28/2014   "1; 2"   FINGER SURGERY Left    "almost cut off" tip of 2nd digit   LEFT HEART CATHETERIZATION  WITH CORONARY ANGIOGRAM N/A 09/28/2014   Procedure: LEFT HEART CATHETERIZATION WITH CORONARY ANGIOGRAM;  Surgeon: Kathleene Hazel, MD;  Location: [facility] CATH LAB;  Service: Cardiovascular;  Laterality: N/A;   NASAL ENDOSCOPY WITH EPISTAXIS CONTROL Bilateral 03/2012   VASECTOMY      Current Outpatient Medications  Medication Sig Dispense Refill   albuterol (PROAIR HFA) 108 (90 Base) MCG/ACT inhaler 2 puffs every 4 hours as needed only  if your can't catch your breath 18 g 11   albuterol (PROVENTIL) (2.5 MG/3ML) 0.083% nebulizer solution Take 3 mLs (2.5 mg total) by nebulization every 4  (four) hours as needed for wheezing or shortness of breath. 360 mL 5   aspirin EC 81 MG tablet Take 81 mg by mouth daily.     atorvastatin (LIPITOR) 40 MG tablet Take 1 tablet (40 mg total) by mouth daily. 90 tablet 3   Budeson-Glycopyrrol-Formoterol (BREZTRI AEROSPHERE) 160-9-4.8 MCG/ACT AERO Inhale 2 puffs into the lungs 2 (two) times daily. 32.37 g 3   ezetimibe (ZETIA) 10 MG tablet Take 10 mg by mouth daily.     furosemide (LASIX) 40 MG tablet Take 1 tablet (40 mg total) by mouth daily. 90 tablet 3   guaiFENesin (MUCINEX) 600 MG 12 hr tablet Take 2 tablets (1,200 mg total) by mouth 2 (two) times daily as needed for cough or to loosen phlegm.     Multiple Vitamin (MULTIVITAMIN WITH MINERALS) TABS tablet Take 1 tablet by mouth daily.     nitroGLYCERIN (NITROSTAT) 0.4 MG SL tablet Place 1 tablet (0.4 mg total) under the tongue every 5 (five) minutes as needed for chest pain. 25 tablet 6   OXYGEN Inhale 2-3 L into the lungs.     traZODone (DESYREL) 50 MG tablet TAKE 1 TABLET BY MOUTH AT BEDTIME AS NEEDED FOR SLEEP. 30 tablet 1   No current facility-administered medications for this visit.    Allergies  Allergen Reactions   Penicillins Other (See Comments)    Passes out    Social History   Socioeconomic History   Marital status: Single    Spouse name: Not on file   Number of children: Not on file   Years of education: Not on file   Highest education level: Not on file  Occupational History   Occupation: Geographical information systems officer work     Employer: [employer]    Comment: Ronette [employer]eter  Tobacco Use   Smoking status: Former    Packs/day: 2.00    Years: 50.00    Pack years: 100.00    Types: Cigarettes    Start date: 1970    Quit date: 04/18/2020    Years since quitting: 1.1   Smokeless tobacco: Never   Tobacco comments:    2 plus packs per day. He has a 100 + pack - year history of tobacco abuse currently. Former 4 ppd for 25 years.  Vaping Use   Vaping Use: Never used  Substance  and Sexual Activity   Alcohol use: Yes    Alcohol/week: 144.0 standard drinks    Types: 144 Cans of beer per week    Comment:  history 12 pack of beer per night".  2021- drinks 3 beer/day   Drug use: No   Sexual activity: Yes    Birth control/protection: None  Other Topics Concern   Not on file  Social History Narrative   The patient lives in [place] with his girlfriend. 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 He is not  routinely exercising.   Social Determinants of Health   Financial Resource Strain: Low Risk    Difficulty of Paying Living Expenses: Not hard at all  Food Insecurity: No Food Insecurity   Worried About Programme researcher, broadcasting/film/video in the Last Year: Never true   Ran Out of Food in the Last Year: Never true  Transportation Needs: No Transportation Needs   Lack of Transportation (Medical): No   Lack of Transportation (Non-Medical): No  Physical Activity: Inactive   Days of Exercise per Week: 0 days   Minutes of Exercise per Session: 0 min  Stress: No Stress Concern Present   Feeling of Stress : Not at all  Social Connections: Socially Isolated   Frequency of Communication with Friends and Family: More than three times a week   Frequency of Social Gatherings with Friends and Family: More than three times a week   Attends Religious Services: Never   Database administrator or Organizations: No   Attends Engineer, structural: Never   Marital Status: Divorced  Catering manager Violence: Not At Risk   Fear of Current or Ex-Partner: No   Emotionally Abused: No   Physically Abused: No   Sexually Abused: No    Family History  Problem Relation Age of Onset   Brain cancer Mother    Heart disease Father     Review of Systems:  As stated in the HPI and otherwise negative.   BP 118/68   Pulse (!) 111   Ht 5\' 9"  (1.753 m)   Wt 159 lb 2.1 oz (72.2 kg)   SpO2 97%   BMI 23.50 kg/m   Physical Examination:  General: Well developed, well nourished, NAD  HEENT: OP  clear, mucus membranes moist  SKIN: warm, dry. No rashes. Neuro: No focal deficits  Musculoskeletal: Muscle strength 5/5 all ext  Psychiatric: Mood and affect normal  Neck: No JVD, no carotid bruits, no thyromegaly, no lymphadenopathy.  Lungs:Clear bilaterally, no wheezes, rhonci, crackles Cardiovascular: Regular rate and rhythm. No murmurs, gallops or rubs. Abdomen:Soft. Bowel sounds present. Non-tender.  Extremities: No lower extremity edema. Pulses are 2 + in the bilateral DP/PT.   Echo 04/19/20:  1. Left ventricular ejection fraction, by estimation, is 65 to 70%. The  left ventricle has normal function. The left ventricle has no regional  wall motion abnormalities. Left ventricular diastolic parameters were  normal.   2. Right ventricular systolic function is normal. The right ventricular  size is normal. There is normal pulmonary artery systolic pressure.   3. The mitral valve is normal in structure. No evidence of mitral valve  regurgitation. No evidence of mitral stenosis.   4. The aortic valve is tricuspid. Aortic valve regurgitation is not  visualized. No aortic stenosis is present.   5. The inferior vena cava is normal in size with greater than 50%  respiratory variability, suggesting right atrial pressure of 3 mmHg.   Echo, Cath reports from [facility] are in Care Everywhere  EKG:  EKG is not ordered today. The ekg ordered today demonstrates   Recent Labs: 10/10/2020: ALT 23 12/20/2020: B Natriuretic Peptide 75.0 02/12/2021: BUN 12; Creatinine, Ser 1.02; Hemoglobin 13.0; Platelets 209; Potassium 4.0; Sodium 135   Lipid Panel    Component Value Date/Time   CHOL 174 10/10/2020 0845   CHOL 139 07/13/2019 0733   TRIG 60 10/10/2020 0845   TRIG 82 08/15/2006 0929   HDL 76 10/10/2020 0845   HDL 64 07/13/2019 0733   CHOLHDL  2.3 10/10/2020 0845   VLDL 12 10/10/2020 0845   LDLCALC 86 10/10/2020 0845   LDLCALC 61 07/13/2019 0733     Wt Readings from Last 3 Encounters:   06/14/21 159 lb 2.1 oz (72.2 kg)  06/13/21 159 lb 12.8 oz (72.5 kg)  06/06/21 160 lb 1.9 oz (72.6 kg)     Other studies Reviewed: Additional studies/ records that were reviewed today include: . Review of the above records demonstrates:    Assessment and Plan:   1. CAD without angina: He has no chest pain. Cath at [facility] in July 2022 during workup for possible lung transplant. Moderate mid LAD stenosis with patent LAD and Circumflex stents. LV function normal by echo July 2022 at [facility] with no valve disease. He does not tolerate beta blockers due to fatigue. Will continue ASA and statin.   He is now DNR and palliative care is being consulted.   2. Tobacco abuse, in remission: He has stopped smoking.   3. HTN: BP is normal. No changes  4. Hyperlipidemia: LDL near goal in January 2022. Continue statin and Zetia  5. Chronic diastolic CHF: LV function normal by echo July 2021. Weight stable. No LE edema. Continue Lasix.   6. Dyspnea/COPD: He is followed in the pulmonary office. Now on continuous supplemental O2. Not a candidate for lung transplant. He is now DNR and palliative care is being consulted.   7. AAA: Large 6.9 cm AAA on CTA at [facility]. Seen by vascular surgery there and no plans for intervention given his pulmonary prognosis. He is ok with this. No plans for further imaging  Current medicines are reviewed at length with the patient today.  The patient does not have concerns regarding medicines.  The following changes have been made:  no change  Labs/ tests ordered today include:   No orders of the defined types were placed in this encounter.   Disposition:   F/U with me in 6 months  Signed, Verne Carrow, MD 06/14/2021 10:03 AM    [facility] [address] [place], [place], [place]  [address] Phone: [phone]; Fax: [phone]

## 2021-06-18 ENCOUNTER — Other Ambulatory Visit: Payer: Self-pay

## 2021-06-18 ENCOUNTER — Telehealth: Payer: Self-pay | Admitting: Pulmonary Disease

## 2021-06-18 MED ORDER — ALBUTEROL SULFATE HFA 108 (90 BASE) MCG/ACT IN AERS: INHALATION_SPRAY | RESPIRATORY_TRACT | 11 refills | Status: DC

## 2021-06-18 NOTE — Telephone Encounter (Signed)
Refill sent.

## 2021-06-19 DIAGNOSIS — J449 Chronic obstructive pulmonary disease, unspecified: Secondary | ICD-10-CM | POA: Diagnosis not present

## 2021-06-19 DIAGNOSIS — J9610 Chronic respiratory failure, unspecified whether with hypoxia or hypercapnia: Secondary | ICD-10-CM | POA: Diagnosis not present

## 2021-06-25 DIAGNOSIS — J9611 Chronic respiratory failure with hypoxia: Secondary | ICD-10-CM | POA: Diagnosis not present

## 2021-06-26 DIAGNOSIS — J449 Chronic obstructive pulmonary disease, unspecified: Secondary | ICD-10-CM | POA: Diagnosis not present

## 2021-07-11 DIAGNOSIS — Z515 Encounter for palliative care: Secondary | ICD-10-CM | POA: Diagnosis not present

## 2021-07-11 DIAGNOSIS — J449 Chronic obstructive pulmonary disease, unspecified: Secondary | ICD-10-CM | POA: Diagnosis not present

## 2021-07-19 DIAGNOSIS — J449 Chronic obstructive pulmonary disease, unspecified: Secondary | ICD-10-CM | POA: Diagnosis not present

## 2021-07-19 DIAGNOSIS — J9610 Chronic respiratory failure, unspecified whether with hypoxia or hypercapnia: Secondary | ICD-10-CM | POA: Diagnosis not present

## 2021-07-26 DIAGNOSIS — J9611 Chronic respiratory failure with hypoxia: Secondary | ICD-10-CM | POA: Diagnosis not present

## 2021-07-27 DIAGNOSIS — J449 Chronic obstructive pulmonary disease, unspecified: Secondary | ICD-10-CM | POA: Diagnosis not present

## 2021-07-31 ENCOUNTER — Other Ambulatory Visit: Payer: Self-pay

## 2021-07-31 ENCOUNTER — Ambulatory Visit (INDEPENDENT_AMBULATORY_CARE_PROVIDER_SITE_OTHER): Payer: Medicare Other

## 2021-07-31 DIAGNOSIS — Z23 Encounter for immunization: Secondary | ICD-10-CM | POA: Diagnosis not present

## 2021-08-09 DIAGNOSIS — J449 Chronic obstructive pulmonary disease, unspecified: Secondary | ICD-10-CM | POA: Diagnosis not present

## 2021-08-09 DIAGNOSIS — Z515 Encounter for palliative care: Secondary | ICD-10-CM | POA: Diagnosis not present

## 2021-08-14 ENCOUNTER — Other Ambulatory Visit: Payer: Self-pay

## 2021-08-14 ENCOUNTER — Telehealth: Payer: Self-pay | Admitting: Pulmonary Disease

## 2021-08-14 MED ORDER — TRAZODONE HCL 100 MG PO TABS: 100.0000 mg | ORAL_TABLET | Freq: Every day | ORAL | 3 refills | Status: DC

## 2021-08-14 MED ORDER — BREZTRI AEROSPHERE 160-9-4.8 MCG/ACT IN AERO: 2.0000 | INHALATION_SPRAY | Freq: Two times a day (BID) | RESPIRATORY_TRACT | 0 refills | Status: DC

## 2021-08-14 NOTE — Telephone Encounter (Signed)
Okay to provide sample of breztri.  Please send script to change trazodone dose to 100 mg nightly for sleep.

## 2021-08-14 NOTE — Telephone Encounter (Signed)
Gave pt sample of Breztri. Changed Trazodone to 100mg . Called and notified patient. He voiced understanding and nothing further needed at this time.

## 2021-08-14 NOTE — Telephone Encounter (Signed)
Pt also using ventilator at night and takes trazodone to help him sleep but is seeing if he needs something else to take in addition to be able to relax to use the mask for the ventilator or something else to help relax.  Patients wife states he is having a lot of difficulty after taking off oxygen at night and turning on ventilator and feels he may need another medication to help him relax or stronger dose of trazadone. He is taking trazadone but prescription runs out on Saturday so will need a refill if Dr. Craige Cotta wants to keep him on trazadone.   Dr. Craige Cotta please advise

## 2021-08-14 NOTE — Telephone Encounter (Signed)
Pt also using ventilator at night and takes trazodone to help him sleep but is seeing if he needs something else to take in addition to be able to relax to use the mask for the ventilator or something else to help relax.

## 2021-08-19 DIAGNOSIS — J449 Chronic obstructive pulmonary disease, unspecified: Secondary | ICD-10-CM | POA: Diagnosis not present

## 2021-08-19 DIAGNOSIS — J9610 Chronic respiratory failure, unspecified whether with hypoxia or hypercapnia: Secondary | ICD-10-CM | POA: Diagnosis not present

## 2021-08-20 ENCOUNTER — Other Ambulatory Visit: Payer: Self-pay

## 2021-08-20 ENCOUNTER — Telehealth: Payer: Self-pay | Admitting: Pulmonary Disease

## 2021-08-20 NOTE — Telephone Encounter (Signed)
Called [facility] on [address]. Pharmacy tech states they do have the medication there and are waiting on pt to pick up. Will inform patient of medication status.   Currently have no breztri samples in [facility] office.   Called [employer] on behalf of pt and received info about shipment of refills. Sent on: 08-12-2021 Tracking number [id number] with [employer]. According to tracking number package should be delivered today by 9 p.m. 08/20/2021  Called and notified patient. He voiced understanding and stated if he doesn't receive package today he will give the office a call to figure out steps in getting a breztri sample.

## 2021-08-21 ENCOUNTER — Other Ambulatory Visit: Payer: Self-pay

## 2021-08-21 ENCOUNTER — Telehealth: Payer: Self-pay | Admitting: Pulmonary Disease

## 2021-08-21 MED ORDER — BREZTRI AEROSPHERE 160-9-4.8 MCG/ACT IN AERO: 2.0000 | INHALATION_SPRAY | Freq: Two times a day (BID) | RESPIRATORY_TRACT | 0 refills | Status: DC

## 2021-08-21 NOTE — Telephone Encounter (Signed)
Spoke to patient and he still has not received package. Checked tracking info again for pt. New est delivery date is thurs. 08-25-2021. Will leave 2 samples at front for pt in case of another delay with package.

## 2021-08-25 DIAGNOSIS — J9611 Chronic respiratory failure with hypoxia: Secondary | ICD-10-CM | POA: Diagnosis not present

## 2021-08-26 DIAGNOSIS — J449 Chronic obstructive pulmonary disease, unspecified: Secondary | ICD-10-CM | POA: Diagnosis not present

## 2021-09-07 DIAGNOSIS — J449 Chronic obstructive pulmonary disease, unspecified: Secondary | ICD-10-CM | POA: Diagnosis not present

## 2021-09-07 DIAGNOSIS — Z515 Encounter for palliative care: Secondary | ICD-10-CM | POA: Diagnosis not present

## 2021-09-18 DIAGNOSIS — J449 Chronic obstructive pulmonary disease, unspecified: Secondary | ICD-10-CM | POA: Diagnosis not present

## 2021-09-18 DIAGNOSIS — J9610 Chronic respiratory failure, unspecified whether with hypoxia or hypercapnia: Secondary | ICD-10-CM | POA: Diagnosis not present

## 2021-09-25 DIAGNOSIS — J9611 Chronic respiratory failure with hypoxia: Secondary | ICD-10-CM | POA: Diagnosis not present

## 2021-09-26 DIAGNOSIS — J449 Chronic obstructive pulmonary disease, unspecified: Secondary | ICD-10-CM | POA: Diagnosis not present

## 2021-10-10 ENCOUNTER — Other Ambulatory Visit: Payer: Self-pay

## 2021-10-10 ENCOUNTER — Ambulatory Visit (INDEPENDENT_AMBULATORY_CARE_PROVIDER_SITE_OTHER): Payer: Medicare Other | Admitting: Internal Medicine

## 2021-10-10 ENCOUNTER — Encounter: Payer: Self-pay | Admitting: Internal Medicine

## 2021-10-10 VITALS — BP 128/78 | HR 56 | Resp 18 | Ht 69.0 in | Wt 163.0 lb

## 2021-10-10 DIAGNOSIS — J449 Chronic obstructive pulmonary disease, unspecified: Secondary | ICD-10-CM | POA: Diagnosis not present

## 2021-10-10 DIAGNOSIS — I7143 Infrarenal abdominal aortic aneurysm, without rupture: Secondary | ICD-10-CM | POA: Diagnosis not present

## 2021-10-10 DIAGNOSIS — Z23 Encounter for immunization: Secondary | ICD-10-CM

## 2021-10-10 DIAGNOSIS — J9611 Chronic respiratory failure with hypoxia: Secondary | ICD-10-CM | POA: Diagnosis not present

## 2021-10-10 DIAGNOSIS — I2510 Atherosclerotic heart disease of native coronary artery without angina pectoris: Secondary | ICD-10-CM | POA: Diagnosis not present

## 2021-10-10 DIAGNOSIS — E782 Mixed hyperlipidemia: Secondary | ICD-10-CM

## 2021-10-10 DIAGNOSIS — J9612 Chronic respiratory failure with hypercapnia: Secondary | ICD-10-CM

## 2021-10-10 NOTE — Assessment & Plan Note (Signed)
On 3 L O2 at home Due to underlying COPD On Breztri Albuterol nebs and inhaler as needed Follows up with Dr. Sood Had evaluation by transplant team, not a surgical candidate 

## 2021-10-10 NOTE — Patient Instructions (Signed)
Please continue to take medications as prescribed.  Please continue to use inhalers regularly.  Please get fasting blood tests done before the next visit.

## 2021-10-10 NOTE — Assessment & Plan Note (Signed)
Well-controlled currently with Breztri Albuterol PRN F/u with Pulmonology 

## 2021-10-10 NOTE — Assessment & Plan Note (Signed)
On statin Will check lipid profile in the next visit 

## 2021-10-10 NOTE — Assessment & Plan Note (Signed)
S/p stent placement in 2011 and 2015 On aspirin and statin Did not tolerate beta-blocker in the past Follows up with cardiology 

## 2021-10-10 NOTE — Progress Notes (Signed)
Established Patient Office Visit  Subjective:  Patient ID: John [name], male    DOB: 12-Dec-1955  Age: 66 y.o. MRN: [id number]  CC:  Chief Complaint  Patient presents with   Follow-up    4 month follow up     HPI John [name] is a 66 y.o. male with past medical history of CAD s/p stent placement, chronic hypoxic respiratory failure due to COPD and chronic leg edema who presents for f/u of his chronic medical conditions.  CAD and HTN: He has h/o CAD and follows up with Cardiology. BP is well-controlled. Takes medications regularly. Patient denies headache, dizziness, chest pain, or palpitations.  Chronic hypoxic respiratory failure due to COPD: He has been using home O2 - 3 lpm currently.  Denies any recent worsening of dyspnea, cough, or fatigue.  Denies any fever, chills, sore throat or nasal congestion.  He has been using Breztri and as needed albuterol for dyspnea.  He received PCV13 in the office today.  Past Medical History:  Diagnosis Date   Alcohol abuse 12/27/2010   Allergic rhinitis, cause unspecified 12/27/2010   Anal warts 04/11/2012   CAD, NATIVE VESSEL 10/04/2010   COPD (chronic obstructive pulmonary disease) ([place])    "a touch" (09/28/2013)   History of blood transfusion 03/2012   related to nose bleed   HYPERLIPIDEMIA-MIXED 10/04/2010   HYPERTENSION, BENIGN 10/04/2010   Myocardial infarction ([place]) 09/16/2010   Pneumonia    "when I was a kid"   Stented coronary artery    Mid LAD    TOBACCO ABUSE 10/04/2010    Past Surgical History:  Procedure Laterality Date   CARDIAC CATHETERIZATION  ~ 2012   CORONARY ANGIOPLASTY WITH STENT PLACEMENT  09/17/2010; 09/28/2014   "1; 2"   FINGER SURGERY Left    "almost cut off" tip of 2nd digit   LEFT HEART CATHETERIZATION WITH CORONARY ANGIOGRAM N/A 09/28/2014   Procedure: LEFT HEART CATHETERIZATION WITH CORONARY ANGIOGRAM;  Surgeon: Burnell Blanks, MD;  Location: [facility] CATH LAB;  Service: Cardiovascular;  Laterality:  N/A;   NASAL ENDOSCOPY WITH EPISTAXIS CONTROL Bilateral 03/2012   VASECTOMY      Family History  Problem Relation Age of Onset   Brain cancer Mother    Heart disease Father     Social History   Socioeconomic History   Marital status: Single    Spouse name: Not on file   Number of children: Not on file   Years of education: Not on file   Highest education level: Not on file  Occupational History   Occupation: Dealer maintenance work     Employer: [employer]    Comment: [name]  Tobacco Use   Smoking status: Former    Packs/day: 2.00    Years: 50.00    Pack years: 100.00    Types: Cigarettes    Start date: 1970    Quit date: 04/18/2020    Years since quitting: 1.4   Smokeless tobacco: Never   Tobacco comments:    2 plus packs per day. He has a 100 + pack - year history of tobacco abuse currently. Former 4 ppd for 25 years.  Vaping Use   Vaping Use: Never used  Substance and Sexual Activity   Alcohol use: Yes    Alcohol/week: 144.0 standard drinks    Types: 144 Cans of beer per week    Comment:  history 12 pack of beer per night".  2021- drinks 3 beer/day   Drug use: No  Sexual activity: Yes    Birth control/protection: None  Other Topics Concern   Not on file  Social History Narrative   The patient lives in [place] with his girlfriend. 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 He is not routinely exercising.   Social Determinants of Health   Financial Resource Strain: Low Risk    Difficulty of Paying Living Expenses: Not hard at all  Food Insecurity: No Food Insecurity   Worried About Charity fundraiser in the Last Year: Never true   [place] in the Last Year: Never true  Transportation Needs: No Transportation Needs   Lack of Transportation (Medical): No   Lack of Transportation (Non-Medical): No  Physical Activity: Inactive   Days of Exercise per Week: 0 days   Minutes of Exercise per Session: 0 min  Stress: No Stress Concern Present   Feeling  of Stress : Not at all  Social Connections: Socially Isolated   Frequency of Communication with Friends and Family: More than three times a week   Frequency of Social Gatherings with Friends and Family: More than three times a week   Attends Religious Services: Never   Marine scientist or Organizations: No   Attends Music therapist: Never   Marital Status: Divorced  Human resources officer Violence: Not At Risk   Fear of Current or Ex-Partner: No   Emotionally Abused: No   Physically Abused: No   Sexually Abused: No    Outpatient Medications Prior to Visit  Medication Sig Dispense Refill   albuterol (PROAIR HFA) 108 (90 Base) MCG/ACT inhaler 2 puffs every 4 hours as needed only  if your can't catch your breath 18 g 11   albuterol (PROVENTIL) (2.5 MG/3ML) 0.083% nebulizer solution Take 3 mLs (2.5 mg total) by nebulization every 4 (four) hours as needed for wheezing or shortness of breath. 360 mL 5   aspirin EC 81 MG tablet Take 81 mg by mouth daily.     atorvastatin (LIPITOR) 40 MG tablet Take 1 tablet (40 mg total) by mouth daily. 90 tablet 3   Budeson-Glycopyrrol-Formoterol (BREZTRI AEROSPHERE) 160-9-4.8 MCG/ACT AERO Inhale 2 puffs into the lungs 2 (two) times daily. 32.37 g 3   ezetimibe (ZETIA) 10 MG tablet Take 10 mg by mouth daily.     furosemide (LASIX) 40 MG tablet Take 1 tablet (40 mg total) by mouth daily. 90 tablet 3   guaiFENesin (MUCINEX) 600 MG 12 hr tablet Take 2 tablets (1,200 mg total) by mouth 2 (two) times daily as needed for cough or to loosen phlegm.     Multiple Vitamin (MULTIVITAMIN WITH MINERALS) TABS tablet Take 1 tablet by mouth daily.     nitroGLYCERIN (NITROSTAT) 0.4 MG SL tablet Place 1 tablet (0.4 mg total) under the tongue every 5 (five) minutes as needed for chest pain. 25 tablet 6   OXYGEN Inhale 2-3 L into the lungs.     traZODone (DESYREL) 100 MG tablet Take 1 tablet (100 mg total) by mouth at bedtime. 30 tablet 3    Budeson-Glycopyrrol-Formoterol (BREZTRI AEROSPHERE) 160-9-4.8 MCG/ACT AERO Inhale 2 puffs into the lungs in the morning and at bedtime. (Patient not taking: Reported on 10/10/2021) 10.7 g 0   Budeson-Glycopyrrol-Formoterol (BREZTRI AEROSPHERE) 160-9-4.8 MCG/ACT AERO Inhale 2 puffs into the lungs in the morning and at bedtime. (Patient not taking: Reported on 10/10/2021) 10.7 g 0   No facility-administered medications prior to visit.    Allergies  Allergen Reactions  Penicillins Other (See Comments)    Passes out    ROS Review of Systems  Constitutional:  Negative for chills and fever.  HENT:  Negative for congestion and sore throat.   Eyes:  Negative for pain and discharge.  Respiratory:  Positive for cough and shortness of breath (Chronic).   Cardiovascular:  Negative for chest pain and palpitations.  Gastrointestinal:  Negative for constipation, diarrhea, nausea and vomiting.  Endocrine: Negative for polydipsia and polyuria.  Genitourinary:  Negative for dysuria and hematuria.  Musculoskeletal:  Negative for neck pain and neck stiffness.  Skin:  Negative for rash.  Neurological:  Negative for dizziness, weakness, numbness and headaches.  Psychiatric/Behavioral:  Negative for agitation and behavioral problems.      Objective:    Physical Exam Vitals reviewed.  Constitutional:      General: He is not in acute distress.    Appearance: He is not diaphoretic.  HENT:     Head: Normocephalic and atraumatic.     Nose: Nose normal.     Mouth/Throat:     Mouth: Mucous membranes are moist.  Eyes:     General: No scleral icterus.    Extraocular Movements: Extraocular movements intact.  Cardiovascular:     Rate and Rhythm: Normal rate and regular rhythm.     Pulses: Normal pulses.     Heart sounds: Normal heart sounds. No murmur heard. Pulmonary:     Breath sounds: Normal breath sounds. No wheezing or rales.     Comments: On 3 l O2 Musculoskeletal:     Cervical back: Neck  supple. No tenderness.     Right lower leg: No edema.     Left lower leg: No edema.  Skin:    General: Skin is warm.     Findings: No rash.  Neurological:     General: No focal deficit present.     Mental Status: He is alert and oriented to person, place, and time.  Psychiatric:        Mood and Affect: Mood normal.        Behavior: Behavior normal.    BP 128/78 (BP Location: Left Arm, Patient Position: Sitting, Cuff Size: Normal)    Pulse (!) 56    Resp 18    Ht _0  (1.753 m)    Wt 163 lb (73.9 kg)    SpO2 91%    BMI 24.07 kg/m  Wt Readings from Last 3 Encounters:  10/10/21 163 lb (73.9 kg)  06/14/21 159 lb 2.1 oz (72.2 kg)  06/13/21 159 lb 12.8 oz (72.5 kg)    Lab Results  Component Value Date   TSH 0.688 04/19/2020   Lab Results  Component Value Date   WBC 7.9 02/12/2021   HGB 13.0 02/12/2021   HCT 40.6 02/12/2021   MCV 103.3 (H) 02/12/2021   PLT 209 02/12/2021   Lab Results  Component Value Date   NA 135 02/12/2021   K 4.0 02/12/2021   CO2 39 (H) 02/12/2021   GLUCOSE 142 (H) 02/12/2021   BUN 12 02/12/2021   CREATININE 1.02 02/12/2021   BILITOT 0.3 10/10/2020   ALKPHOS 122 10/10/2020   AST 26 10/10/2020   ALT 23 10/10/2020   PROT 6.9 10/10/2020   ALBUMIN 3.6 10/10/2020   CALCIUM 9.1 02/12/2021   ANIONGAP 7 02/12/2021   GFR 92.34 06/14/2020   Lab Results  Component Value Date   CHOL 174 10/10/2020   Lab Results  Component Value Date   HDL 76  10/10/2020   Lab Results  Component Value Date   LDLCALC 86 10/10/2020   Lab Results  Component Value Date   TRIG 60 10/10/2020   Lab Results  Component Value Date   CHOLHDL 2.3 10/10/2020   Lab Results  Component Value Date   HGBA1C 5.1 04/24/2020      Assessment & Plan:   Problem List Items Addressed This Visit       Cardiovascular and Mediastinum   CAD, NATIVE VESSEL    S/p stent placement in 2011 and 2015 On aspirin and statin Did not tolerate beta-blocker in the past Follows up with  cardiology      Relevant Orders   CMP14+EGFR   Lipid Profile   Abdominal aortic aneurysm (AAA) without rupture    About 6.9 cm infrarenal AAA Was evaluated by [facility] and is not a surgical candidate due to high perioperative risk in the setting of his chronic respiratory failure Patient understands risk of unrepaired AAA (rupture)        Respiratory   Chronic respiratory failure with hypoxia and hypercapnia ([place]) - Primary    On 3 L O2 at home Due to underlying COPD On Breztri Albuterol nebs and inhaler as needed Follows up with Dr. Halford Chessman Had evaluation by transplant team, not a surgical candidate      Relevant Orders   CBC with Differential/Platelet   CMP14+EGFR   Chronic obstructive pulmonary disease ([place])    Well-controlled currently with Breztri Albuterol PRN F/u with Pulmonology      Relevant Orders   CBC with Differential/Platelet     Other   HLD (hyperlipidemia)    On statin Will check lipid profile in the next visit      Other Visit Diagnoses     Need for pneumococcal vaccination       Relevant Orders   Pneumococcal conjugate vaccine 13-valent (Completed)       No orders of the defined types were placed in this encounter.   Follow-up: Return in about 6 months (around 04/09/2022).    Lindell Spar, MD

## 2021-10-10 NOTE — Assessment & Plan Note (Signed)
About 6.9 cm infrarenal AAA Was evaluated by [facility] Vascular surgery and is not a surgical candidate due to high perioperative risk in the setting of his chronic respiratory failure Patient understands risk of unrepaired AAA (rupture) 

## 2021-10-15 ENCOUNTER — Other Ambulatory Visit: Payer: Self-pay

## 2021-10-15 ENCOUNTER — Encounter: Payer: Self-pay | Admitting: Pulmonary Disease

## 2021-10-15 ENCOUNTER — Ambulatory Visit: Payer: Medicare Other | Admitting: Pulmonary Disease

## 2021-10-15 VITALS — BP 134/80 | HR 68 | Temp 98.0°F | Wt 163.0 lb

## 2021-10-15 DIAGNOSIS — J439 Emphysema, unspecified: Secondary | ICD-10-CM

## 2021-10-15 DIAGNOSIS — I7143 Infrarenal abdominal aortic aneurysm, without rupture: Secondary | ICD-10-CM

## 2021-10-15 DIAGNOSIS — G4700 Insomnia, unspecified: Secondary | ICD-10-CM

## 2021-10-15 DIAGNOSIS — E8801 Alpha-1-antitrypsin deficiency: Secondary | ICD-10-CM | POA: Diagnosis not present

## 2021-10-15 DIAGNOSIS — J449 Chronic obstructive pulmonary disease, unspecified: Secondary | ICD-10-CM

## 2021-10-15 DIAGNOSIS — J9612 Chronic respiratory failure with hypercapnia: Secondary | ICD-10-CM

## 2021-10-15 DIAGNOSIS — J310 Chronic rhinitis: Secondary | ICD-10-CM

## 2021-10-15 DIAGNOSIS — R911 Solitary pulmonary nodule: Secondary | ICD-10-CM

## 2021-10-15 DIAGNOSIS — J9611 Chronic respiratory failure with hypoxia: Secondary | ICD-10-CM

## 2021-10-15 DIAGNOSIS — J432 Centrilobular emphysema: Secondary | ICD-10-CM

## 2021-10-15 MED ORDER — AZELASTINE HCL 0.1 % NA SOLN: 1.0000 | Freq: Two times a day (BID) | NASAL | 12 refills | Status: DC

## 2021-10-15 MED ORDER — FLUTICASONE PROPIONATE 50 MCG/ACT NA SUSP: 1.0000 | Freq: Every day | NASAL | 2 refills | Status: DC

## 2021-10-15 NOTE — Progress Notes (Signed)
[facility]  Chief Complaint  Patient presents with   Follow-up    Patient feels breathing is about the same  Would like to discuss ent referral    Past Surgical History:  He  has a past surgical history that includes Finger surgery (Left); Vasectomy; Coronary angioplasty with stent (09/17/2010; 09/28/2014); Cardiac catheterization (~ 2012); Nasal endoscopy with epistaxis control (Bilateral, 03/2012); and left heart catheterization with coronary angiogram (N/A, 09/28/2014).  Past Medical History:  PNA, HTN, HLD, CAD, Seizure, ETOH, infrarenal abdominal aortic aneurysm  Constitutional:  BP 134/80 (BP Location: Left Arm, Patient Position: Sitting)    Pulse 68    Temp 98 F (36.7 C)    Wt 163 lb (73.9 kg)    SpO2 92% Comment: 3lo2   BMI 24.07 kg/m   Brief Summary:  John [name] is a 66 y.o. 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      Subjective:   He is here with his wife.  He has been getting intermittent stuffiness in his nose.  Happens mostly when he is laying down.  Blockage shifts when he rotates position.  Has occasional cough with clear sputum. No having wheeze, fever, hemoptysis, or chest pain.  Breztri helps.    He has been set up with palliative care.  He feels this has been a helpful resource.  Physical Exam:   Appearance - well kempt, sitting in wheelchair, wearing oxygen  ENMT - no sinus tenderness, no oral exudate, no LAN, Mallampati 3 airway, no stridor  Respiratory - decreased breath sounds bilaterally, no wheezing or rales  CV - s1s2 regular rate and rhythm, no murmurs  Ext - no clubbing, no edema  Skin - no rashes  Psych - normal mood and affect     Pulmonary testing:  PFT 12/09/19 >> FEV1 .059 (16%), FEV1% 37, DLCO 27% A1AT 03/09/20 >> 110, MZ ABG 02/12/21 >> pH 7.28, PCO2 80.9, PO2 109 on 40% FiO2 PFT 04/11/21  >> FEV1 0.6 (18%), FEV1% 26, DLCO 21%  Chest Imaging:  CT angio chest 11/23/19 >> moderate centrilobular emphysema, mild infrahilar BTX, 6 mm nodule LUL CT chest 01/02/21 >> no change LUL nodule, new 1.1 cm LLL opacity CT chest 03/28/21 >> severe emphysema, 2.2 x 0.8 cm band like opacity RLL from ATX, mucus plugging, 3 mm nodule RUL, 1.6 x 0.7 cm nodularity LUL CT chest 04/09/21 >> diffuse centrilobular emphysema, stable nodule LUL, 8 mm nodule RLL  Cardiac Tests:  Echo 04/19/20 >> EF 65 to 70%  Social History:  He  reports that he quit smoking about 17 months ago. His smoking use included cigarettes. He started smoking about 53 years ago. He has a 100.00 pack-year smoking history. He has never used smokeless tobacco. He reports current alcohol use of about 144.0 standard drinks per week. He reports that he does not use drugs.  Family History:  His family history includes Brain cancer in his mother; Heart disease in his father.     Assessment/Plan:   Severe COPD from emphysema. - he had assessment at [facility] in July 2022 and informed he wasn't a candidate for lung transplant due to coronary artery disease and infrarenal abdominal aortic aneurysm - continue breztri with spacer - prn albuterol  MZ alpha 1 antitrypsin heterozygote. - his level is sufficient that he does require augmentation therapy  Chronic respiratory failure with hypoxia and hypercapnia. - he uses  [employer] for his DME - goal SpO2 > 90% - uses 3 liters oxygen 24/7 - discussed the role of NIMV and how this can help him; he will try to use more regular during the night and prn during the day  Lung nodule. - he has CT chest without contrast scheduled for July 2023  Chronic rhinitis. - will have him try flonase and then add azelastine if needed  - if continues, then could also try ipratropium nasal spray - previously seen by Dr. Suszanne Conners with ENT  Insomnia. - continue trazodone 50 mg qhs  Coronary artery disease. - he is  followed by Dr. Earney Hamburg with [facility]  Infrarenal abdominal aortic aneurysm. - 6.9 x 6.6 cm on CT abd/pelvis from [facility] in July 2022 - he was advised by surgery at [facility] to pursue conservative management for this  Goals of care. - had lengthy discussion with him and his wife - he was clear he would not want intubation or cardiac resuscitation in the event of cardiac arrest - he is okay to follow up CT imaging, and continue NIMV and supplemental oxygen - he is concerned about dying in the hospital - followed by palliative care  Time Spent Involved in Patient Care on Day of Examination:  37 minutes  Follow up:   Patient Instructions  Try using flonase one spray in each nostril daily.  If you are still having trouble with breathing through your nose, then you can try using azelastine 1 spray in each nostril twice per day.  You can use flonase and azelastine together.  Will schedule your CT chest in July 2023 and arrange for follow up appointment after this.  Medication List:   Allergies as of 10/15/2021       Reactions   Penicillins Other (See Comments)   Passes out        Medication List        Accurate as of October 15, 2021 10:31 AM. If you have any questions, ask your nurse or doctor.          STOP taking these medications    ezetimibe 10 MG tablet Commonly known as: ZETIA Stopped by: Coralyn Helling, MD       TAKE these medications    albuterol (2.5 MG/3ML) 0.083% nebulizer solution Commonly known as: PROVENTIL Take 3 mLs (2.5 mg total) by nebulization every 4 (four) hours as needed for wheezing or shortness of breath.   albuterol 108 (90 Base) MCG/ACT inhaler Commonly known as: ProAir HFA 2 puffs every 4 hours as needed only  if your can't catch your breath   aspirin EC 81 MG tablet Take 81 mg by mouth daily.   atorvastatin 40 MG tablet Commonly known as: LIPITOR Take 1 tablet (40 mg total) by mouth daily.   azelastine 0.1 % nasal  spray Commonly known as: ASTELIN Place 1 spray into both nostrils 2 (two) times daily. Use in each nostril as directed Started by: Coralyn Helling, MD   Jerrye Bushy [phone] MCG/ACT Aero Generic drug: Budeson-Glycopyrrol-Formoterol Inhale 2 puffs into the lungs 2 (two) times daily.   fluticasone 50 MCG/ACT nasal spray Commonly known as: FLONASE Place 1 spray into both nostrils daily. Started by: Coralyn Helling, MD   furosemide 40 MG tablet Commonly known as: LASIX Take 1 tablet (40 mg total) by mouth daily.   guaiFENesin 600 MG 12 hr tablet Commonly known as: Mucinex Take 2 tablets (1,200 mg total) by mouth 2 (two) times daily as  needed for cough or to loosen phlegm.   multivitamin with minerals Tabs tablet Take 1 tablet by mouth daily.   nitroGLYCERIN 0.4 MG SL tablet Commonly known as: Nitrostat Place 1 tablet (0.4 mg total) under the tongue every 5 (five) minutes as needed for chest pain.   OXYGEN Inhale 2-3 L into the lungs.   traZODone 100 MG tablet Commonly known as: DESYREL Take 1 tablet (100 mg total) by mouth at bedtime.        Signature:  Coralyn Helling, MD [facility]/Critical Care Pager - [phone] 10/15/2021, 10:31 AM

## 2021-10-15 NOTE — Patient Instructions (Signed)
Try using flonase one spray in each nostril daily.  If you are still having trouble with breathing through your nose, then you can try using azelastine 1 spray in each nostril twice per day.  You can use flonase and azelastine together.  Will schedule your CT chest in July 2023 and arrange for follow up appointment after this.

## 2021-10-19 DIAGNOSIS — J449 Chronic obstructive pulmonary disease, unspecified: Secondary | ICD-10-CM | POA: Diagnosis not present

## 2021-10-19 DIAGNOSIS — J9610 Chronic respiratory failure, unspecified whether with hypoxia or hypercapnia: Secondary | ICD-10-CM | POA: Diagnosis not present

## 2021-10-26 DIAGNOSIS — J9611 Chronic respiratory failure with hypoxia: Secondary | ICD-10-CM | POA: Diagnosis not present

## 2021-10-29 ENCOUNTER — Telehealth: Payer: Self-pay | Admitting: Pulmonary Disease

## 2021-10-29 ENCOUNTER — Other Ambulatory Visit: Payer: Self-pay

## 2021-10-29 MED ORDER — BREZTRI AEROSPHERE 160-9-4.8 MCG/ACT IN AERO: 2.0000 | INHALATION_SPRAY | Freq: Two times a day (BID) | RESPIRATORY_TRACT | 11 refills | Status: DC

## 2021-10-29 NOTE — Telephone Encounter (Signed)
Rx printed and AZ[employer]ME provider form filled out. Faxed over to [facility] office for Dr. Craige Cotta to sign.   Will route to Dr. Craige Cotta and April(Dr.Soods nurse today) so they are aware.

## 2021-10-29 NOTE — Telephone Encounter (Signed)
Got fax off of fax machine for the patient. Will have Dr Craige Cotta sign and will fax to [employer]Me.  Nothing further needed.

## 2021-10-29 NOTE — Telephone Encounter (Signed)
Rx printed and AZ&ME provider form filled out and faxed to [facility] office for Dr. Craige Cotta to sign.   Will route to Dr. Craige Cotta and April(his nurse today) so they are aware.

## 2021-10-29 NOTE — Telephone Encounter (Signed)
Called and informed John [name] that the papers were signed by Dr. Halford Chessman

## 2021-10-29 NOTE — Telephone Encounter (Signed)
Prescription signed.  Please fax to listed number.

## 2021-10-31 ENCOUNTER — Telehealth: Payer: Self-pay

## 2021-10-31 DIAGNOSIS — H5203 Hypermetropia, bilateral: Secondary | ICD-10-CM | POA: Diagnosis not present

## 2021-10-31 NOTE — Telephone Encounter (Signed)
Received a fax from  [employer] regarding an approval for  BREZTRI  patient assistance from 10/29/2021 to 09/22/2022. Approval letter sent to scan center.

## 2021-11-02 DIAGNOSIS — J449 Chronic obstructive pulmonary disease, unspecified: Secondary | ICD-10-CM | POA: Diagnosis not present

## 2021-11-02 DIAGNOSIS — Z515 Encounter for palliative care: Secondary | ICD-10-CM | POA: Diagnosis not present

## 2021-11-09 ENCOUNTER — Telehealth: Payer: Self-pay | Admitting: Pulmonary Disease

## 2021-11-09 NOTE — Telephone Encounter (Signed)
Called and spoke to John [name] (ok per DPR) She states [facility] does have medication. Patients nebulizer machine isn't blowing out medication like it used to. Debbie states patient feels like he cant feel it like he could before when using nebulizer machine. She asked what she could do and I suggested checking tubing for holes or any disconnects. She states she will try that and if they are still having problems will call back and let us know so we can  help. Nothing further needed.

## 2021-11-09 NOTE — Telephone Encounter (Signed)
Asking if Dr. Craige Cotta wants him to take something else to replace solution.  Please advise.

## 2021-11-19 DIAGNOSIS — J9610 Chronic respiratory failure, unspecified whether with hypoxia or hypercapnia: Secondary | ICD-10-CM | POA: Diagnosis not present

## 2021-11-19 DIAGNOSIS — J449 Chronic obstructive pulmonary disease, unspecified: Secondary | ICD-10-CM | POA: Diagnosis not present

## 2021-11-23 DIAGNOSIS — J9611 Chronic respiratory failure with hypoxia: Secondary | ICD-10-CM | POA: Diagnosis not present

## 2021-11-26 ENCOUNTER — Other Ambulatory Visit: Payer: Self-pay | Admitting: Cardiovascular Disease

## 2021-11-29 ENCOUNTER — Telehealth: Payer: Self-pay | Admitting: Pulmonary Disease

## 2021-11-29 MED ORDER — AZITHROMYCIN 250 MG PO TABS: ORAL_TABLET | ORAL | 0 refills | Status: AC

## 2021-11-29 NOTE — Telephone Encounter (Signed)
Please send script for a Zpak.  If symptoms not improved over the next couple of days, then he needs to schedule ROV. ?

## 2021-11-29 NOTE — Telephone Encounter (Signed)
Called and notified patient of Dr. Silverio Lay response. He voiced understanding. Zpak sent to [facility] [address] [facility]  ?

## 2021-11-29 NOTE — Telephone Encounter (Signed)
Primary Pulmonologist: Dr. Craige Cotta  ?Last office visit and with whom: Dr.  Craige Cotta 10/15/21 ?What do we see them for (pulmonary problems): emphysema and COPD  ?Last OV assessment/plan: see below ? ?Was appointment offered to patient (explain)?  No none available  ? ? ?Reason for call: Pt's wife came in stating and showing a pic of yellow clear mucus and has gotten darker over the last few days.  Pt is using nebulizer more.  Also stating left hand cramps up. Requesting medication to be called in. [facility].  Please advise.  ? ?Allergies  ?Allergen Reactions  ? Penicillins Other (See Comments)  ?  Passes out  ? ? ?Immunization History  ?Administered Date(s) Administered  ? Hep A / Hep B 04/10/2021  ? Hepatitis B, adult 04/10/2021  ? Influenza,inj,Quad PF,6+ Mos 07/05/2020, 07/31/2021  ? Influenza-Unspecified 07/05/2020  ? Moderna Sars-Covid-2 Vaccination 12/15/2019, 01/12/2020, 08/15/2020  ? Pneumococcal Conjugate-13 10/10/2021  ? Pneumococcal Polysaccharide-23 10/11/2020  ?  ?Assessment/Plan:  ?  ?Severe COPD from emphysema. ?- he had assessment at [facility] in July 2022 and informed he wasn't a candidate for lung transplant due to coronary artery disease and infrarenal abdominal aortic aneurysm ?- continue breztri with spacer ?- prn albuterol ?  ?MZ alpha 1 antitrypsin heterozygote. ?- his level is sufficient that he does require augmentation therapy ?  ?Chronic respiratory failure with hypoxia and hypercapnia. ?- he uses [employer] for his DME ?- goal SpO2 > 90% ?- uses 3 liters oxygen 24/7 ?- discussed the role of NIMV and how this can help him; he will try to use more regular during the night and prn during the day ?  ?Lung nodule. ?- he has CT chest without contrast scheduled for July 2023 ?  ?Chronic rhinitis. ?- will have him try flonase and then add azelastine if needed  ?- if continues, then could also try ipratropium nasal spray ?- previously seen by Dr. Suszanne Conners with ENT ?  ?Insomnia. ?- continue trazodone 50 mg qhs ?   ?Coronary artery disease. ?- he is followed by Dr. Earney Hamburg with [facility] ?  ?Infrarenal abdominal aortic aneurysm. ?- 6.9 x 6.6 cm on CT abd/pelvis from [facility] in July 2022 ?- he was advised by surgery at [facility] to pursue conservative management for this ?  ?Goals of care. ?- had lengthy discussion with him and his wife ?- he was clear he would not want intubation or cardiac resuscitation in the event of cardiac arrest ?- he is okay to follow up CT imaging, and continue NIMV and supplemental oxygen ?- he is concerned about dying in the hospital ?- followed by palliative care ?  ?Time Spent Involved in Patient Care on Day of Examination:  ?37 minutes ?  ?Follow up:  ?  ?Patient Instructions  ?Try using flonase one spray in each nostril daily.  If you are still having trouble with breathing through your nose, then you can try using azelastine 1 spray in each nostril twice per day.  You can use flonase and azelastine together. ?  ?Will schedule your CT chest in July 2023 and arrange for follow up appointment after this. ?  ?Medication List:  ?  ?Allergies as of 10/15/2021   ?  ?    Reactions  ?  Penicillins Other (See Comments)  ?  Passes out  ? ?

## 2021-12-04 NOTE — Progress Notes (Signed)
? ?Chief Complaint  ?Patient presents with  ? Follow-up  ?  CAD  ? ?History of Present Illness: 66 yo male with history of CAD , HTN, hyperlipidemia, chronic diastolic CHF and severe COPD who is here today for cardiac follow up. He was admitted to [facility] December 2011 with a NSTEMI. Cardiac cath 09/17/10 and was found to have a severe stenosis of the LAD which was treated with a drug eluting stent. He was also found to have moderate disease in the RCA (70%) and Circumflex (70%) managed medically. Cardiac cath June 2012 with stable CAD. He  had 3 admissions in July 2013 with severe epistaxis causing hemorrhagic shock with tachycardia and drop in blood pressure. This was treated with packing and transfusion. His Plavix was stopped. He was admitted January 2016 with unstable angina. Cardiac cath January 2016 with severe mid LAD stenosis, severe mid Circumflex stenosis. I placed a drug eluting stent in the mid LAD and a drug eluting stent in the mid Circumflex. LV function normal. I saw him in May 2016 and he c/o fatigue and dyspnea. His Coreg was stopped and he had resolution of his symptoms. He missed follow up appointments in 2017-2020 due to lack of insurance and cost. I saw him February 2020 with c/o dyspnea and lower extremity edema. He had been off of all medications due to losing his job and having no money. He responded well to Lasix. He has established in our pulmonary office with Dr. Craige Cotta. He was admitted to [facility] July 2021 with acute respiratory failure secondary to COPD exacerbation and community acquired pneumonia and was intubated. He was euvolemic on admission per notes but developed LE edema during his hospital stay. He was discharged on Lasix 40 mg po BID. Echo 04/19/20 with LVEF=65-70%. No valve disease. He has stopped smoking. He is on supplemental O2 24 hours per day now. He was seen at [facility] July 2022 and told he was not a candidate for lung transplant. Cardiac cath at [facility] July 2022  with 60% mid LAD stenosis, patent LAD stents, patent Circumflex stent. Echo July 2022 at [facility] with normal LV function and no valve disease. CTA abdomen at [facility] with 6.9 cm AAA. He was seen by Vascular surgery there and turned down for intervention. He was told he had 6 months to live and was made DNR and placed on palliative care.  ? ?He is here today for follow up. The patient denies any chest pain, dyspnea, palpitations, lower extremity edema, orthopnea, PND, dizziness, near syncope or syncope.  ? ?Primary Care Physician: Anabel Halon, MD ? ?Past Medical History:  ?Diagnosis Date  ? Alcohol abuse 12/27/2010  ? Allergic rhinitis, cause unspecified 12/27/2010  ? Anal warts 04/11/2012  ? CAD, NATIVE VESSEL 10/04/2010  ? COPD (chronic obstructive pulmonary disease) (HCC)   ? "a touch" (09/28/2013)  ? History of blood transfusion 03/2012  ? related to nose bleed  ? HYPERLIPIDEMIA-MIXED 10/04/2010  ? HYPERTENSION, BENIGN 10/04/2010  ? Myocardial infarction (HCC) 09/16/2010  ? Pneumonia   ? "when I was a kid"  ? Stented coronary artery   ? Mid LAD   ? TOBACCO ABUSE 10/04/2010  ? ? ?Past Surgical History:  ?Procedure Laterality Date  ? CARDIAC CATHETERIZATION  ~ 2012  ? CORONARY ANGIOPLASTY WITH STENT PLACEMENT  09/17/2010; 09/28/2014  ? "1; 2"  ? FINGER SURGERY Left   ? "almost cut off" tip of 2nd digit  ? LEFT HEART CATHETERIZATION WITH CORONARY ANGIOGRAM N/A  09/28/2014  ? Procedure: LEFT HEART CATHETERIZATION WITH CORONARY ANGIOGRAM;  Surgeon: Kathleene Hazel, MD;  Location: [facility] CATH LAB;  Service: Cardiovascular;  Laterality: N/A;  ? NASAL ENDOSCOPY WITH EPISTAXIS CONTROL Bilateral 03/2012  ? VASECTOMY    ? ? ?Current Outpatient Medications  ?Medication Sig Dispense Refill  ? albuterol (PROAIR HFA) 108 (90 Base) MCG/ACT inhaler 2 puffs every 4 hours as needed only  if your can't catch your breath 18 g 11  ? albuterol (PROVENTIL) (2.5 MG/3ML) 0.083% nebulizer solution Take 3 mLs (2.5 mg total) by nebulization every 4 (four)  hours as needed for wheezing or shortness of breath. 360 mL 5  ? aspirin EC 81 MG tablet Take 81 mg by mouth daily.    ? Budeson-Glycopyrrol-Formoterol (BREZTRI AEROSPHERE) 160-9-4.8 MCG/ACT AERO Inhale 2 puffs into the lungs in the morning and at bedtime. 10.7 g 11  ? guaiFENesin (MUCINEX) 600 MG 12 hr tablet Take 2 tablets (1,200 mg total) by mouth 2 (two) times daily as needed for cough or to loosen phlegm.    ? Multiple Vitamin (MULTIVITAMIN WITH MINERALS) TABS tablet Take 1 tablet by mouth daily.    ? OXYGEN Inhale 2-3 L into the lungs.    ? atorvastatin (LIPITOR) 40 MG tablet Take 1 tablet (40 mg total) by mouth daily. 90 tablet 3  ? furosemide (LASIX) 40 MG tablet Take 1 tablet (40 mg total) by mouth daily. 90 tablet 3  ? nitroGLYCERIN (NITROSTAT) 0.4 MG SL tablet Place 1 tablet (0.4 mg total) under the tongue every 5 (five) minutes as needed for chest pain. 25 tablet 6  ? traZODone (DESYREL) 100 MG tablet Take 1 tablet (100 mg total) by mouth at bedtime. 30 tablet 3  ? ?No current facility-administered medications for this visit.  ? ? ?Allergies  ?Allergen Reactions  ? Penicillins Other (See Comments)  ?  Passes out  ? ? ?Social History  ? ?Socioeconomic History  ? Marital status: Single  ?  Spouse name: Not on file  ? Number of children: Not on file  ? Years of education: Not on file  ? Highest education level: Not on file  ?Occupational History  ? Occupation: Geographical information systems officer work   ?  Employer: [employer]  ?  Comment: Ronette [employer]  ?Tobacco Use  ? Smoking status: Former  ?  Packs/day: 2.00  ?  Years: 50.00  ?  Pack years: 100.00  ?  Types: Cigarettes  ?  Start date: 1970  ?  Quit date: 04/18/2020  ?  Years since quitting: 1.6  ? Smokeless tobacco: Never  ? Tobacco comments:  ?  2 plus packs per day. He has a 100 + pack - year history of tobacco abuse currently. Former 4 ppd for 25 years.  ?Vaping Use  ? Vaping Use: Never used  ?Substance and Sexual Activity  ? Alcohol use: Yes  ?  Alcohol/week: 144.0  standard drinks  ?  Types: 144 Cans of beer per week  ?  Comment:  history 12 pack of beer per night".  2021- drinks 3 beer/day  ? Drug use: No  ? Sexual activity: Yes  ?  Birth control/protection: None  ?Other Topics Concern  ? Not on file  ?Social History Narrative  ? The patient lives in [place] with his girlfriend. 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 He is not routinely exercising.  ? ?Social Determinants of Health  ? ?Financial Resource Strain: Low Risk   ?  Difficulty of Paying Living Expenses: Not hard at all  ?Food Insecurity: No Food Insecurity  ? Worried About Programme researcher, broadcasting/film/video in the Last Year: Never true  ? Ran Out of Food in the Last Year: Never true  ?Transportation Needs: No Transportation Needs  ? Lack of Transportation (Medical): No  ? Lack of Transportation (Non-Medical): No  ?Physical Activity: Inactive  ? Days of Exercise per Week: 0 days  ? Minutes of Exercise per Session: 0 min  ?Stress: No Stress Concern Present  ? Feeling of Stress : Not at all  ?Social Connections: Socially Isolated  ? Frequency of Communication with Friends and Family: More than three times a week  ? Frequency of Social Gatherings with Friends and Family: More than three times a week  ? Attends Religious Services: Never  ? Active Member of Clubs or Organizations: No  ? Attends Banker Meetings: Never  ? Marital Status: Divorced  ?Intimate Partner Violence: Not At Risk  ? Fear of Current or Ex-Partner: No  ? Emotionally Abused: No  ? Physically Abused: No  ? Sexually Abused: No  ? ? ?Family History  ?Problem Relation Age of Onset  ? Brain cancer Mother   ? Heart disease Father   ? ? ?Review of Systems:  As stated in the HPI and otherwise negative.  ? ?BP 130/72   Pulse (!) 112   Ht 5\' 9"  (1.753 m)   Wt 166 lb 6.4 oz (75.5 kg)   SpO2 93%   BMI 24.57 kg/m?  ? ?Physical Examination: ? ?General: Well developed, well nourished, NAD  ?HEENT: OP clear, mucus membranes moist  ?SKIN: warm, dry. No rashes. ?Neuro:  No focal deficits  ?Musculoskeletal: Muscle strength 5/5 all ext  ?Psychiatric: Mood and affect normal  ?Neck: No JVD, no carotid bruits, no thyromegaly, no lymphadenopathy.  ?Lungs:Clear bilaterally, no wheeze

## 2021-12-05 ENCOUNTER — Ambulatory Visit: Payer: Medicare Other | Admitting: Cardiovascular Disease

## 2021-12-05 ENCOUNTER — Other Ambulatory Visit: Payer: Self-pay

## 2021-12-05 ENCOUNTER — Encounter: Payer: Self-pay | Admitting: Cardiovascular Disease

## 2021-12-05 DIAGNOSIS — I714 Abdominal aortic aneurysm, without rupture, unspecified: Secondary | ICD-10-CM

## 2021-12-05 DIAGNOSIS — I2510 Atherosclerotic heart disease of native coronary artery without angina pectoris: Secondary | ICD-10-CM

## 2021-12-05 DIAGNOSIS — E7800 Pure hypercholesterolemia, unspecified: Secondary | ICD-10-CM | POA: Diagnosis not present

## 2021-12-05 DIAGNOSIS — I5032 Chronic diastolic (congestive) heart failure: Secondary | ICD-10-CM

## 2021-12-05 DIAGNOSIS — I10 Essential (primary) hypertension: Secondary | ICD-10-CM | POA: Diagnosis not present

## 2021-12-05 MED ORDER — ATORVASTATIN CALCIUM 40 MG PO TABS: 40.0000 mg | ORAL_TABLET | Freq: Every day | ORAL | 3 refills | Status: DC

## 2021-12-05 MED ORDER — NITROGLYCERIN 0.4 MG SL SUBL: 0.4000 mg | SUBLINGUAL_TABLET | SUBLINGUAL | 6 refills | Status: DC | PRN

## 2021-12-05 MED ORDER — FUROSEMIDE 40 MG PO TABS: 40.0000 mg | ORAL_TABLET | Freq: Every day | ORAL | 3 refills | Status: DC

## 2021-12-05 NOTE — Patient Instructions (Signed)

## 2021-12-17 DIAGNOSIS — J9610 Chronic respiratory failure, unspecified whether with hypoxia or hypercapnia: Secondary | ICD-10-CM | POA: Diagnosis not present

## 2021-12-17 DIAGNOSIS — J449 Chronic obstructive pulmonary disease, unspecified: Secondary | ICD-10-CM | POA: Diagnosis not present

## 2021-12-18 ENCOUNTER — Telehealth: Payer: Self-pay | Admitting: Pulmonary Disease

## 2021-12-18 MED ORDER — ALBUTEROL SULFATE (2.5 MG/3ML) 0.083% IN NEBU: 2.5000 mg | INHALATION_SOLUTION | RESPIRATORY_TRACT | 5 refills | Status: DC | PRN

## 2021-12-18 MED ORDER — AZITHROMYCIN 250 MG PO TABS: ORAL_TABLET | ORAL | 0 refills | Status: AC

## 2021-12-18 NOTE — Telephone Encounter (Signed)
Primary Pulmonologist: Dr. Craige Cotta  ?Last office visit and with whom: Dr. Craige Cotta 10/15/2021 ?What do we see them for (pulmonary problems): emphysema  ?Last OV assessment/plan: see below  ? ?Was appointment offered to patient (explain)?  No none available  ? ? ?Reason for call:   ?Patient states he was outside on Saturday and since then he has not felt well.  ?Today has a Fever of 102.5, nausea, indigestion, runny nose.  ?No flu or covid tests taken. Patient thinks it is due to pollen.   ?Would like medication sent in for patient and would also like recommendations on what to take for allergies  ? ?  ? ?Allergies  ?Allergen Reactions  ? Penicillins Other (See Comments)  ?  Passes out  ? ? ?Immunization History  ?Administered Date(s) Administered  ? Hep A / Hep B 04/10/2021  ? Hepatitis B, adult 04/10/2021  ? Influenza,inj,Quad PF,6+ Mos 07/05/2020, 07/31/2021  ? Influenza-Unspecified 07/05/2020  ? Moderna Sars-Covid-2 Vaccination 12/15/2019, 01/12/2020, 08/15/2020  ? Pneumococcal Conjugate-13 10/10/2021  ? Pneumococcal Polysaccharide-23 10/11/2020  ?  ?Assessment/Plan:  ?  ?Severe COPD from emphysema. ?- he had assessment at [facility] in July 2022 and informed he wasn't a candidate for lung transplant due to coronary artery disease and infrarenal abdominal aortic aneurysm ?- continue breztri with spacer ?- prn albuterol ?  ?MZ alpha 1 antitrypsin heterozygote. ?- his level is sufficient that he does require augmentation therapy ?  ?Chronic respiratory failure with hypoxia and hypercapnia. ?- he uses [employer] for his DME ?- goal SpO2 > 90% ?- uses 3 liters oxygen 24/7 ?- discussed the role of NIMV and how this can help him; he will try to use more regular during the night and prn during the day ?  ?Lung nodule. ?- he has CT chest without contrast scheduled for July 2023 ?  ?Chronic rhinitis. ?- will have him try flonase and then add azelastine if needed  ?- if continues, then could also try ipratropium nasal spray ?- previously  seen by Dr. Suszanne Conners with ENT ?  ?Insomnia. ?- continue trazodone 50 mg qhs ?  ?Coronary artery disease. ?- he is followed by Dr. Earney Hamburg with [facility] ?  ?Infrarenal abdominal aortic aneurysm. ?- 6.9 x 6.6 cm on CT abd/pelvis from [facility] in July 2022 ?- he was advised by surgery at [facility] to pursue conservative management for this ?  ?Goals of care. ?- had lengthy discussion with him and his wife ?- he was clear he would not want intubation or cardiac resuscitation in the event of cardiac arrest ?- he is okay to follow up CT imaging, and continue NIMV and supplemental oxygen ?- he is concerned about dying in the hospital ?- followed by palliative care ?  ?Time Spent Involved in Patient Care on Day of Examination:  ?37 minutes ?  ?Follow up:  ?  ?Patient Instructions  ?Try using flonase one spray in each nostril daily.  If you are still having trouble with breathing through your nose, then you can try using azelastine 1 spray in each nostril twice per day.  You can use flonase and azelastine together. ?  ?Will schedule your CT chest in July 2023 and arrange for follow up appointment after this. ?

## 2021-12-18 NOTE — Telephone Encounter (Signed)
Called and went over recs with Debbie ( per DPR). She voiced understanding. Also asked if we could send refill of neb solution to [facility] in [place] since it doesn't cost them anything to get it from there. Refill and Zpak sent. Nothing further needed ?

## 2021-12-18 NOTE — Telephone Encounter (Signed)
Would be unusual to have a fever from allergies.  He might have a sinus or respiratory infection.  Please send script for zpak.  He can use xyzal OTC as needed to help with allergy symptoms. ?

## 2021-12-24 DIAGNOSIS — J9611 Chronic respiratory failure with hypoxia: Secondary | ICD-10-CM | POA: Diagnosis not present

## 2022-01-07 DIAGNOSIS — Z515 Encounter for palliative care: Secondary | ICD-10-CM | POA: Diagnosis not present

## 2022-01-07 DIAGNOSIS — J449 Chronic obstructive pulmonary disease, unspecified: Secondary | ICD-10-CM | POA: Diagnosis not present

## 2022-01-17 DIAGNOSIS — J449 Chronic obstructive pulmonary disease, unspecified: Secondary | ICD-10-CM | POA: Diagnosis not present

## 2022-01-17 DIAGNOSIS — J9610 Chronic respiratory failure, unspecified whether with hypoxia or hypercapnia: Secondary | ICD-10-CM | POA: Diagnosis not present

## 2022-01-22 DIAGNOSIS — J9610 Chronic respiratory failure, unspecified whether with hypoxia or hypercapnia: Secondary | ICD-10-CM | POA: Diagnosis not present

## 2022-01-22 DIAGNOSIS — J449 Chronic obstructive pulmonary disease, unspecified: Secondary | ICD-10-CM | POA: Diagnosis not present

## 2022-01-23 DIAGNOSIS — J9611 Chronic respiratory failure with hypoxia: Secondary | ICD-10-CM | POA: Diagnosis not present

## 2022-02-16 DIAGNOSIS — J9610 Chronic respiratory failure, unspecified whether with hypoxia or hypercapnia: Secondary | ICD-10-CM | POA: Diagnosis not present

## 2022-02-16 DIAGNOSIS — J449 Chronic obstructive pulmonary disease, unspecified: Secondary | ICD-10-CM | POA: Diagnosis not present

## 2022-02-23 DIAGNOSIS — J9611 Chronic respiratory failure with hypoxia: Secondary | ICD-10-CM | POA: Diagnosis not present

## 2022-03-04 DIAGNOSIS — Z515 Encounter for palliative care: Secondary | ICD-10-CM | POA: Diagnosis not present

## 2022-03-04 DIAGNOSIS — J449 Chronic obstructive pulmonary disease, unspecified: Secondary | ICD-10-CM | POA: Diagnosis not present

## 2022-03-19 DIAGNOSIS — J449 Chronic obstructive pulmonary disease, unspecified: Secondary | ICD-10-CM | POA: Diagnosis not present

## 2022-03-19 DIAGNOSIS — J9610 Chronic respiratory failure, unspecified whether with hypoxia or hypercapnia: Secondary | ICD-10-CM | POA: Diagnosis not present

## 2022-03-22 DIAGNOSIS — J449 Chronic obstructive pulmonary disease, unspecified: Secondary | ICD-10-CM | POA: Diagnosis not present

## 2022-03-22 DIAGNOSIS — J9611 Chronic respiratory failure with hypoxia: Secondary | ICD-10-CM | POA: Diagnosis not present

## 2022-03-22 DIAGNOSIS — J9612 Chronic respiratory failure with hypercapnia: Secondary | ICD-10-CM | POA: Diagnosis not present

## 2022-03-22 DIAGNOSIS — I2510 Atherosclerotic heart disease of native coronary artery without angina pectoris: Secondary | ICD-10-CM | POA: Diagnosis not present

## 2022-03-23 LAB — CBC WITH DIFFERENTIAL/PLATELET
Basophils Absolute: 0.1 10*3/uL (ref 0.0–0.2)
Basos: 1 %
EOS (ABSOLUTE): 0.1 10*3/uL (ref 0.0–0.4)
Eos: 2 %
Hematocrit: 36.9 % — ABNORMAL LOW (ref 37.5–51.0)
Hemoglobin: 11.8 g/dL — ABNORMAL LOW (ref 13.0–17.7)
Immature Grans (Abs): 0 10*3/uL (ref 0.0–0.1)
Immature Granulocytes: 0 %
Lymphocytes Absolute: 1.5 10*3/uL (ref 0.7–3.1)
Lymphs: 22 %
MCH: 30.6 pg (ref 26.6–33.0)
MCHC: 32 g/dL (ref 31.5–35.7)
MCV: 96 fL (ref 79–97)
Monocytes Absolute: 0.7 10*3/uL (ref 0.1–0.9)
Monocytes: 10 %
Neutrophils Absolute: 4.3 10*3/uL (ref 1.4–7.0)
Neutrophils: 65 %
Platelets: 216 10*3/uL (ref 150–450)
RBC: 3.86 x10E6/uL — ABNORMAL LOW (ref 4.14–5.80)
RDW: 12.7 % (ref 11.6–15.4)
WBC: 6.6 10*3/uL (ref 3.4–10.8)

## 2022-03-23 LAB — CMP14+EGFR
ALT: 23 IU/L (ref 0–44)
AST: 29 IU/L (ref 0–40)
Albumin/Globulin Ratio: 1.6 (ref 1.2–2.2)
Albumin: 4.2 g/dL (ref 3.8–4.8)
Alkaline Phosphatase: 151 IU/L — ABNORMAL HIGH (ref 44–121)
BUN/Creatinine Ratio: 14 (ref 10–24)
BUN: 13 mg/dL (ref 8–27)
Bilirubin Total: 0.2 mg/dL (ref 0.0–1.2)
CO2: 34 mmol/L — ABNORMAL HIGH (ref 20–29)
Calcium: 9.6 mg/dL (ref 8.6–10.2)
Chloride: 97 mmol/L (ref 96–106)
Creatinine, Ser: 0.93 mg/dL (ref 0.76–1.27)
Globulin, Total: 2.7 g/dL (ref 1.5–4.5)
Glucose: 96 mg/dL (ref 70–99)
Potassium: 4.9 mmol/L (ref 3.5–5.2)
Sodium: 142 mmol/L (ref 134–144)
Total Protein: 6.9 g/dL (ref 6.0–8.5)
eGFR: 92 mL/min/{1.73_m2} (ref >59)

## 2022-03-23 LAB — LIPID PANEL
Chol/HDL Ratio: 2.2 ratio (ref 0.0–5.0)
Cholesterol, Total: 166 mg/dL (ref 100–199)
HDL: 77 mg/dL (ref >39)
LDL Chol Calc (NIH): 76 mg/dL (ref 0–99)
Triglycerides: 70 mg/dL (ref 0–149)
VLDL Cholesterol Cal: 13 mg/dL (ref 5–40)

## 2022-03-25 DIAGNOSIS — J9611 Chronic respiratory failure with hypoxia: Secondary | ICD-10-CM | POA: Diagnosis not present

## 2022-03-27 ENCOUNTER — Ambulatory Visit (HOSPITAL_COMMUNITY)
Admission: RE | Admit: 2022-03-27 | Discharge: 2022-03-27 | Disposition: A | Discharge status: Home / Self Care | Payer: Medicare Other | Source: Ambulatory Visit | Attending: Internal Medicine | Admitting: Internal Medicine

## 2022-03-27 DIAGNOSIS — R918 Other nonspecific abnormal finding of lung field: Secondary | ICD-10-CM | POA: Diagnosis not present

## 2022-03-27 DIAGNOSIS — R911 Solitary pulmonary nodule: Secondary | ICD-10-CM | POA: Diagnosis not present

## 2022-04-01 ENCOUNTER — Ambulatory Visit: Payer: Medicare Other | Admitting: Pulmonary Disease

## 2022-04-01 ENCOUNTER — Encounter: Payer: Self-pay | Admitting: Pulmonary Disease

## 2022-04-01 VITALS — BP 132/82 | HR 112 | Temp 97.7°F | Ht 69.0 in | Wt 164.8 lb

## 2022-04-01 DIAGNOSIS — J432 Centrilobular emphysema: Secondary | ICD-10-CM

## 2022-04-01 DIAGNOSIS — J9611 Chronic respiratory failure with hypoxia: Secondary | ICD-10-CM

## 2022-04-01 DIAGNOSIS — J9612 Chronic respiratory failure with hypercapnia: Secondary | ICD-10-CM

## 2022-04-01 DIAGNOSIS — R911 Solitary pulmonary nodule: Secondary | ICD-10-CM

## 2022-04-01 MED ORDER — TRAZODONE HCL 100 MG PO TABS: 100.0000 mg | ORAL_TABLET | Freq: Every day | ORAL | 3 refills | Status: DC

## 2022-04-01 NOTE — Progress Notes (Signed)
[facility]  Chief Complaint  Patient presents with   Follow-up    Breathing has worsened. Has been using 3-4LO2 cont    Past Surgical History:  He  has a past surgical history that includes Finger surgery (Left); Vasectomy; Coronary angioplasty with stent (09/17/2010; 09/28/2014); Cardiac catheterization (~ 2012); Nasal endoscopy with epistaxis control (Bilateral, 03/2012); and left heart catheterization with coronary angiogram (N/A, 09/28/2014).  Past Medical History:  PNA, HTN, HLD, CAD, Seizure, ETOH, infrarenal abdominal aortic aneurysm  Constitutional:  BP 132/82 (BP Location: Left Arm, Patient Position: Sitting)   Pulse (!) 112   Temp 97.7 F (36.5 C) (Temporal)   Ht 5\' 9"  (1.753 m)   Wt 164 lb 12.8 oz (74.8 kg)   SpO2 96% Comment: 3Lo2 cont  BMI 24.34 kg/m   Brief Summary:  John [name] is a 66 y.o. 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      Subjective:   He is here with his wife.  His CT chest showed stable lung nodule in LLL.  He is having more trouble with his breathing.  Gets winded with any type of activity.    He is waiting for new filters so he can start using NIV again.  He has occasional cough.  Not having sputum, wheeze, or leg swelling.  Gets blood streaks from his nose when he blows his nose sometimes.  He wants to take one more trip to [place]  Physical Exam:   Appearance - well kempt, wearing oxygen, using a scooter  ENMT - no sinus tenderness, no oral exudate, no LAN, Mallampati 3 airway, no stridor  Respiratory - decreased breath sounds bilaterally, no wheezing or rales  CV - s1s2 regular rate and rhythm, no murmurs  Ext - no clubbing, no edema  Skin - multiple areas of ecchymoses on his arms  Psych - normal mood and affect      Pulmonary testing:  PFT 12/09/19 >> FEV1 .059 (16%), FEV1% 37,  DLCO 27% A1AT 03/09/20 >> 110, MZ ABG 02/12/21 >> pH 7.28, PCO2 80.9, PO2 109 on 40% FiO2 PFT 04/11/21 >> FEV1 0.6 (18%), FEV1% 26, DLCO 21%  Chest Imaging:  CT angio chest 11/23/19 >> moderate centrilobular emphysema, mild infrahilar BTX, 6 mm nodule LUL CT chest 01/02/21 >> no change LUL nodule, new 1.1 cm LLL opacity CT chest 03/28/21 >> severe emphysema, 2.2 x 0.8 cm band like opacity RLL from ATX, mucus plugging, 3 mm nodule RUL, 1.6 x 0.7 cm nodularity LUL CT chest 04/09/21 >> diffuse centrilobular emphysema, stable nodule LUL, 8 mm nodule RLL CT chest 03/27/22 >> stable GGO LLL  Cardiac Tests:  Echo 04/19/20 >> EF 65 to 70%  Social History:  He  reports that he quit smoking about 1 years ago. His smoking use included cigarettes. He started smoking about 53 years ago. He has a 100.00 pack-year smoking history. He has never used smokeless tobacco. He reports current alcohol use of about 144.0 standard drinks of alcohol per week. He reports that he does not use drugs.  Family History:  His family history includes Brain cancer in his mother; Heart disease in his father.     Assessment/Plan:   Severe COPD from emphysema. - he had assessment at [facility] in July 2022 and informed he wasn't a candidate for lung transplant due to coronary artery disease and infrarenal abdominal aortic aneurysm - continue breztri with  spacer - prn albuterol - prn mucinex  MZ alpha 1 antitrypsin heterozygote. - his level is sufficient that he does require augmentation therapy  Chronic respiratory failure with hypoxia and hypercapnia. - he uses [employer] for his DME - goal SpO2 > 90% - uses 3 to 4 liters 24/7 - NIV at night and prn during the day  Lung nodule. - he would like to continue radiographic monitoring - he will need follow up CT chest without contrast in July 2024  Chronic rhinitis. - will have him try flonase and then add azelastine if needed  - if continues, then could also try ipratropium nasal  spray - previously seen by Dr. Suszanne Conners with ENT  Insomnia. - continue trazodone 100 mg qhs  Coronary artery disease. - he is followed by Dr. Earney Hamburg with [facility]  Infrarenal abdominal aortic aneurysm. - 6.9 x 6.6 cm on CT abd/pelvis from [facility] in July 2022 - he was advised by surgery at [facility] to pursue conservative management for this  Goals of care. - he was clear he would not want intubation or cardiac resuscitation in the event of cardiac arrest - he is okay to follow up CT imaging, and continue NIMV and supplemental oxygen - he is concerned about dying in the hospital - followed by palliative care  Time Spent Involved in Patient Care on Day of Examination:  38 minutes  Follow up:   Patient Instructions  Follow up in 2 months  Medication List:   Allergies as of 04/01/2022       Reactions   Penicillins Other (See Comments)   Passes out        Medication List        Accurate as of April 01, 2022 10:37 AM. If you have any questions, ask your nurse or doctor.          albuterol 108 (90 Base) MCG/ACT inhaler Commonly known as: ProAir HFA 2 puffs every 4 hours as needed only  if your can't catch your breath   albuterol (2.5 MG/3ML) 0.083% nebulizer solution Commonly known as: PROVENTIL Take 3 mLs (2.5 mg total) by nebulization every 4 (four) hours as needed for wheezing or shortness of breath.   aspirin EC 81 MG tablet Take 81 mg by mouth daily.   atorvastatin 40 MG tablet Commonly known as: LIPITOR Take 1 tablet (40 mg total) by mouth daily.   Breztri Aerosphere 160-9-4.8 MCG/ACT Aero Generic drug: Budeson-Glycopyrrol-Formoterol Inhale 2 puffs into the lungs in the morning and at bedtime.   furosemide 40 MG tablet Commonly known as: LASIX Take 1 tablet (40 mg total) by mouth daily.   guaiFENesin 600 MG 12 hr tablet Commonly known as: Mucinex Take 2 tablets (1,200 mg total) by mouth 2 (two) times daily as needed for cough or to loosen  phlegm.   multivitamin with minerals Tabs tablet Take 1 tablet by mouth daily.   nitroGLYCERIN 0.4 MG SL tablet Commonly known as: Nitrostat Place 1 tablet (0.4 mg total) under the tongue every 5 (five) minutes as needed for chest pain.   OXYGEN Inhale 2-3 L into the lungs.   traZODone 100 MG tablet Commonly known as: DESYREL Take 1 tablet (100 mg total) by mouth at bedtime.        Signature:  Coralyn Helling, MD [facility] Pager - [phone] 04/01/2022, 10:37 AM

## 2022-04-01 NOTE — Patient Instructions (Signed)
Follow up in 2 months

## 2022-04-09 ENCOUNTER — Ambulatory Visit (INDEPENDENT_AMBULATORY_CARE_PROVIDER_SITE_OTHER): Payer: Medicare Other | Admitting: Internal Medicine

## 2022-04-09 ENCOUNTER — Encounter: Payer: Self-pay | Admitting: Internal Medicine

## 2022-04-09 VITALS — BP 132/76 | HR 101 | Resp 18 | Ht 69.0 in | Wt 165.0 lb

## 2022-04-09 DIAGNOSIS — J449 Chronic obstructive pulmonary disease, unspecified: Secondary | ICD-10-CM

## 2022-04-09 DIAGNOSIS — I7143 Infrarenal abdominal aortic aneurysm, without rupture: Secondary | ICD-10-CM

## 2022-04-09 DIAGNOSIS — E782 Mixed hyperlipidemia: Secondary | ICD-10-CM

## 2022-04-09 DIAGNOSIS — D508 Other iron deficiency anemias: Secondary | ICD-10-CM

## 2022-04-09 DIAGNOSIS — J9611 Chronic respiratory failure with hypoxia: Secondary | ICD-10-CM

## 2022-04-09 DIAGNOSIS — F5101 Primary insomnia: Secondary | ICD-10-CM

## 2022-04-09 DIAGNOSIS — Z1211 Encounter for screening for malignant neoplasm of colon: Secondary | ICD-10-CM

## 2022-04-09 DIAGNOSIS — J9612 Chronic respiratory failure with hypercapnia: Secondary | ICD-10-CM

## 2022-04-09 DIAGNOSIS — D509 Iron deficiency anemia, unspecified: Secondary | ICD-10-CM | POA: Insufficient documentation

## 2022-04-09 NOTE — Assessment & Plan Note (Signed)
On 3 L O2 at home Due to underlying COPD On Breztri Albuterol nebs and inhaler as needed Follows up with Dr. Sood Had evaluation by transplant team, not a surgical candidate 

## 2022-04-09 NOTE — Assessment & Plan Note (Signed)
Well-controlled currently with Breztri Albuterol PRN F/u with Pulmonology 

## 2022-04-09 NOTE — Assessment & Plan Note (Signed)
Well-controlled with trazodone 

## 2022-04-09 NOTE — Assessment & Plan Note (Signed)
About 6.9 cm infrarenal AAA Was evaluated by [facility] surgery and is not a surgical candidate due to high perioperative risk in the setting of his chronic respiratory failure Patient understands risk of unrepaired AAA (rupture)

## 2022-04-09 NOTE — Assessment & Plan Note (Signed)
CBC reviewed, recent drop in Hb No signs of active bleeding, but has easy bruising On Aspirin for CAD Recheck CBC after 3 months

## 2022-04-09 NOTE — Assessment & Plan Note (Signed)
On statin Reviewed lipid profile 

## 2022-04-09 NOTE — Patient Instructions (Addendum)
Please continue taking medications as prescribed.  Please continue to follow low salt diet and ambulate as tolerated.  Please consider getting Shingrix and Tdap vaccine at your local pharmacy. 

## 2022-04-09 NOTE — Progress Notes (Signed)
Established Patient Office Visit  Subjective:  Patient ID: John [name], male    DOB: 07-Aug-1956  Age: 66 y.o. MRN: [id number]  CC:  Chief Complaint  Patient presents with   Follow-up    6 month follow up     HPI John [name] is a 66 y.o. male with past medical history of CAD s/p stent placement, chronic hypoxic respiratory failure due to COPD and chronic leg edema who presents for f/u of his chronic medical conditions.  CAD and HTN: He has h/o CAD and follows up with Cardiology. BP is well-controlled. Takes medications regularly. Patient denies headache, dizziness, chest pain, or palpitations.   Chronic hypoxic respiratory failure due to COPD: He has been using home O2 - 3 lpm currently.  Denies any recent worsening of dyspnea, cough, or fatigue.  Denies any fever, chills, sore throat or nasal congestion.  He has been using Breztri and as needed albuterol for dyspnea.  Blood tests were reviewed and discussed with the patient and his wife in detail.     Past Medical History:  Diagnosis Date   Alcohol abuse 12/27/2010   Allergic rhinitis, cause unspecified 12/27/2010   Anal warts 04/11/2012   CAD, NATIVE VESSEL 10/04/2010   COPD (chronic obstructive pulmonary disease) ([place])    "a touch" (09/28/2013)   History of blood transfusion 03/2012   related to nose bleed   HYPERLIPIDEMIA-MIXED 10/04/2010   HYPERTENSION, BENIGN 10/04/2010   Myocardial infarction ([place]) 09/16/2010   Pneumonia    "when I was a kid"   Stented coronary artery    Mid LAD    TOBACCO ABUSE 10/04/2010    Past Surgical History:  Procedure Laterality Date   CARDIAC CATHETERIZATION  ~ 2012   CORONARY ANGIOPLASTY WITH STENT PLACEMENT  09/17/2010; 09/28/2014   "1; 2"   FINGER SURGERY Left    "almost cut off" tip of 2nd digit   LEFT HEART CATHETERIZATION WITH CORONARY ANGIOGRAM N/A 09/28/2014   Procedure: LEFT HEART CATHETERIZATION WITH CORONARY ANGIOGRAM;  Surgeon: Burnell Blanks, MD;  Location: [facility]  CATH LAB;  Service: Cardiovascular;  Laterality: N/A;   NASAL ENDOSCOPY WITH EPISTAXIS CONTROL Bilateral 03/2012   VASECTOMY      Family History  Problem Relation Age of Onset   Brain cancer Mother    Heart disease Father     Social History   Socioeconomic History   Marital status: Single    Spouse name: Not on file   Number of children: Not on file   Years of education: Not on file   Highest education level: Not on file  Occupational History   Occupation: Dealer maintenance work     Employer: [employer]    Comment: [name]  Tobacco Use   Smoking status: Former    Packs/day: 2.00    Years: 50.00    Total pack years: 100.00    Types: Cigarettes    Start date: 1970    Quit date: 04/18/2020    Years since quitting: 1.9   Smokeless tobacco: Never   Tobacco comments:    2 plus packs per day. He has a 100 + pack - year history of tobacco abuse currently. Former 4 ppd for 25 years.  Vaping Use   Vaping Use: Never used  Substance and Sexual Activity   Alcohol use: Yes    Alcohol/week: 144.0 standard drinks of alcohol    Types: 144 Cans of beer per week    Comment:  history 12 pack  of beer per night".  2021- drinks 3 beer/day   Drug use: No   Sexual activity: Yes    Birth control/protection: None  Other Topics Concern   Not on file  Social History Narrative   The patient lives in [place] with his girlfriend. 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 He is not routinely exercising.   Social Determinants of Health   Financial Resource Strain: Low Risk  (06/02/2021)   Overall Financial Resource Strain (CARDIA)    Difficulty of Paying Living Expenses: Not hard at all  Food Insecurity: No Food Insecurity (06/02/2021)   Hunger Vital Sign    Worried About Running Out of Food in the Last Year: Never true    Ran Out of Food in the Last Year: Never true  Transportation Needs: No Transportation Needs (06/02/2021)   PRAPARE - Hydrologist (Medical):  No    Lack of Transportation (Non-Medical): No  Physical Activity: Inactive (06/02/2021)   Exercise Vital Sign    Days of Exercise per Week: 0 days    Minutes of Exercise per Session: 0 min  Stress: No Stress Concern Present (06/02/2021)   [place]    Feeling of Stress : Not at all  Social Connections: Socially Isolated (06/02/2021)   Social Connection and Isolation Panel [NHANES]    Frequency of Communication with Friends and Family: More than three times a week    Frequency of Social Gatherings with Friends and Family: More than three times a week    Attends Religious Services: Never    Marine scientist or Organizations: No    Attends Archivist Meetings: Never    Marital Status: Divorced  Human resources officer Violence: Not At Risk (06/02/2021)   Humiliation, Afraid, Rape, and Kick questionnaire    Fear of Current or Ex-Partner: No    Emotionally Abused: No    Physically Abused: No    Sexually Abused: No    Outpatient Medications Prior to Visit  Medication Sig Dispense Refill   albuterol (PROAIR HFA) 108 (90 Base) MCG/ACT inhaler 2 puffs every 4 hours as needed only  if your can't catch your breath 18 g 11   albuterol (PROVENTIL) (2.5 MG/3ML) 0.083% nebulizer solution Take 3 mLs (2.5 mg total) by nebulization every 4 (four) hours as needed for wheezing or shortness of breath. 360 mL 5   aspirin EC 81 MG tablet Take 81 mg by mouth daily.     atorvastatin (LIPITOR) 40 MG tablet Take 1 tablet (40 mg total) by mouth daily. 90 tablet 3   Budeson-Glycopyrrol-Formoterol (BREZTRI AEROSPHERE) 160-9-4.8 MCG/ACT AERO Inhale 2 puffs into the lungs in the morning and at bedtime. 10.7 g 11   furosemide (LASIX) 40 MG tablet Take 1 tablet (40 mg total) by mouth daily. 90 tablet 3   guaiFENesin (MUCINEX) 600 MG 12 hr tablet Take 2 tablets (1,200 mg total) by mouth 2 (two) times daily as needed for cough or to loosen phlegm.      Multiple Vitamin (MULTIVITAMIN WITH MINERALS) TABS tablet Take 1 tablet by mouth daily.     nitroGLYCERIN (NITROSTAT) 0.4 MG SL tablet Place 1 tablet (0.4 mg total) under the tongue every 5 (five) minutes as needed for chest pain. 25 tablet 6   OXYGEN Inhale 2-3 L into the lungs.     traZODone (DESYREL) 100 MG tablet Take 1 tablet (100 mg total) by mouth at  bedtime. 90 tablet 3   No facility-administered medications prior to visit.    Allergies  Allergen Reactions   Penicillins Other (See Comments)    Passes out    ROS Review of Systems  Constitutional:  Negative for chills and fever.  HENT:  Negative for congestion and sore throat.   Eyes:  Negative for pain and discharge.  Respiratory:  Positive for cough and shortness of breath (Chronic).   Cardiovascular:  Negative for chest pain and palpitations.  Gastrointestinal:  Negative for constipation, diarrhea, nausea and vomiting.  Endocrine: Negative for polydipsia and polyuria.  Genitourinary:  Negative for dysuria and hematuria.  Musculoskeletal:  Negative for neck pain and neck stiffness.  Skin:  Negative for rash.  Neurological:  Negative for dizziness, weakness, numbness and headaches.  Psychiatric/Behavioral:  Negative for agitation and behavioral problems.       Objective:    Physical Exam Vitals reviewed.  Constitutional:      General: He is not in acute distress.    Appearance: He is not diaphoretic.     Comments: In electric scooter  HENT:     Head: Normocephalic and atraumatic.     Nose: Nose normal.     Mouth/Throat:     Mouth: Mucous membranes are moist.  Eyes:     General: No scleral icterus.    Extraocular Movements: Extraocular movements intact.  Cardiovascular:     Rate and Rhythm: Normal rate and regular rhythm.     Pulses: Normal pulses.     Heart sounds: Normal heart sounds. No murmur heard. Pulmonary:     Breath sounds: Normal breath sounds. No wheezing or rales.     Comments: On 3 l  O2 Musculoskeletal:     Cervical back: Neck supple. No tenderness.     Right lower leg: No edema.     Left lower leg: No edema.  Skin:    General: Skin is warm.     Findings: No rash.  Neurological:     General: No focal deficit present.     Mental Status: He is alert and oriented to person, place, and time.  Psychiatric:        Mood and Affect: Mood normal.        Behavior: Behavior normal.     BP 132/76 (BP Location: Left Arm, Patient Position: Sitting, Cuff Size: Normal)   Pulse (!) 101   Resp 18   Ht 5' 9"  (1.753 m)   Wt 165 lb (74.8 kg)   SpO2 96%   BMI 24.37 kg/m  Wt Readings from Last 3 Encounters:  04/09/22 165 lb (74.8 kg)  04/01/22 164 lb 12.8 oz (74.8 kg)  12/05/21 166 lb 6.4 oz (75.5 kg)    Lab Results  Component Value Date   TSH 0.688 04/19/2020   Lab Results  Component Value Date   WBC 6.6 03/22/2022   HGB 11.8 (L) 03/22/2022   HCT 36.9 (L) 03/22/2022   MCV 96 03/22/2022   PLT 216 03/22/2022   Lab Results  Component Value Date   NA 142 03/22/2022   K 4.9 03/22/2022   CO2 34 (H) 03/22/2022   GLUCOSE 96 03/22/2022   BUN 13 03/22/2022   CREATININE 0.93 03/22/2022   BILITOT 0.2 03/22/2022   ALKPHOS 151 (H) 03/22/2022   AST 29 03/22/2022   ALT 23 03/22/2022   PROT 6.9 03/22/2022   ALBUMIN 4.2 03/22/2022   CALCIUM 9.6 03/22/2022   ANIONGAP 7 02/12/2021   EGFR 92  03/22/2022   GFR 92.34 06/14/2020   Lab Results  Component Value Date   CHOL 166 03/22/2022   Lab Results  Component Value Date   HDL 77 03/22/2022   Lab Results  Component Value Date   LDLCALC 76 03/22/2022   Lab Results  Component Value Date   TRIG 70 03/22/2022   Lab Results  Component Value Date   CHOLHDL 2.2 03/22/2022   Lab Results  Component Value Date   HGBA1C 5.1 04/24/2020      Assessment & Plan:   Problem List Items Addressed This Visit       Cardiovascular and Mediastinum   Abdominal aortic aneurysm (AAA) without rupture (HCC)    About 6.9 cm  infrarenal AAA Was evaluated by [facility] and is not a surgical candidate due to high perioperative risk in the setting of his chronic respiratory failure Patient understands risk of unrepaired AAA (rupture)        Respiratory   Chronic respiratory failure with hypoxia and hypercapnia ([place]) - Primary    On 3 L O2 at home Due to underlying COPD On Breztri Albuterol nebs and inhaler as needed Follows up with Dr. Halford Chessman Had evaluation by transplant team, not a surgical candidate      Chronic obstructive pulmonary disease ([place])    Well-controlled currently with Breztri Albuterol PRN F/u with Pulmonology        Other   HLD (hyperlipidemia)    On statin Reviewed lipid profile      Primary insomnia    Well controlled with trazodone      IDA (iron deficiency anemia)    CBC reviewed, recent drop in Hb No signs of active bleeding, but has easy bruising On Aspirin for CAD Recheck CBC after 3 months      Relevant Orders   CBC   Other Visit Diagnoses     Special screening for malignant neoplasms, colon       Relevant Orders   Cologuard       No orders of the defined types were placed in this encounter.   Follow-up: Return in about 6 months (around 10/10/2022) for Annual physical.    Lindell Spar, MD

## 2022-04-18 DIAGNOSIS — J449 Chronic obstructive pulmonary disease, unspecified: Secondary | ICD-10-CM | POA: Diagnosis not present

## 2022-04-18 DIAGNOSIS — J9610 Chronic respiratory failure, unspecified whether with hypoxia or hypercapnia: Secondary | ICD-10-CM | POA: Diagnosis not present

## 2022-04-22 DIAGNOSIS — Z1211 Encounter for screening for malignant neoplasm of colon: Secondary | ICD-10-CM | POA: Diagnosis not present

## 2022-04-25 DIAGNOSIS — J9611 Chronic respiratory failure with hypoxia: Secondary | ICD-10-CM | POA: Diagnosis not present

## 2022-04-30 DIAGNOSIS — J449 Chronic obstructive pulmonary disease, unspecified: Secondary | ICD-10-CM | POA: Diagnosis not present

## 2022-04-30 DIAGNOSIS — Z515 Encounter for palliative care: Secondary | ICD-10-CM | POA: Diagnosis not present

## 2022-04-30 LAB — COLOGUARD: COLOGUARD: POSITIVE — AB

## 2022-05-06 ENCOUNTER — Telehealth: Payer: Self-pay | Admitting: Pulmonary Disease

## 2022-05-06 NOTE — Telephone Encounter (Signed)
Called and spoke to patients S/O Debbie and verified that the trazadone was sent In for patient for help with anxiety at last ov. She states this only makes patient sleepy and doesn't help him with his anxiety. I voiced understanding and informed Debbie that Dr. Craige Cotta is out of office until next week but she states she would like this addressed today if possible and would like something sent in for patient to [facility] in [place]. She states the patient is extremely anxious about his breathing and anxious about going to sleep with NIV and she is concerned about his anxiety.   Sending to DOD by patients request.  Rhunette Croft NP please advise. Thank you!    Last ov Assessment/ Plan   Assessment/Plan:    Severe COPD from emphysema. - he had assessment at [facility] in July 2022 and informed he wasn't a candidate for lung transplant due to coronary artery disease and infrarenal abdominal aortic aneurysm - continue breztri with spacer - prn albuterol - prn mucinex   MZ alpha 1 antitrypsin heterozygote. - his level is sufficient that he does require augmentation therapy   Chronic respiratory failure with hypoxia and hypercapnia. - he uses [employer] for his DME - goal SpO2 > 90% - uses 3 to 4 liters 24/7 - NIV at night and prn during the day   Lung nodule. - he would like to continue radiographic monitoring - he will need follow up CT chest without contrast in July 2024   Chronic rhinitis. - will have him try flonase and then add azelastine if needed  - if continues, then could also try ipratropium nasal spray - previously seen by Dr. Suszanne Conners with ENT   Insomnia. - continue trazodone 100 mg qhs   Coronary artery disease. - he is followed by Dr. Earney Hamburg with [facility]   Infrarenal abdominal aortic aneurysm. - 6.9 x 6.6 cm on CT abd/pelvis from [facility] in July 2022 - he was advised by surgery at [facility] to pursue conservative management for this   Goals of care. - he was clear he  would not want intubation or cardiac resuscitation in the event of cardiac arrest - he is okay to follow up CT imaging, and continue NIMV and supplemental oxygen - he is concerned about dying in the hospital - followed by palliative care   Time Spent Involved in Patient Care on Day of Examination:  38 minutes   Follow up:    Patient Instructions  Follow up in 2 months   Medication List:    Allergies as of 04/01/2022         Reactions    Penicillins Other (See Comments)    Passes out            Medication List           Accurate as of April 01, 2022 10:37 AM. If you have any questions, ask your nurse or doctor.              albuterol 108 (90 Base) MCG/ACT inhaler Commonly known as: ProAir HFA 2 puffs every 4 hours as needed only  if your can't catch your breath    albuterol (2.5 MG/3ML) 0.083% nebulizer solution Commonly known as: PROVENTIL Take 3 mLs (2.5 mg total) by nebulization every 4 (four) hours as needed for wheezing or shortness of breath.    aspirin EC 81 MG tablet Take 81 mg by mouth daily.    atorvastatin 40 MG tablet Commonly  known as: LIPITOR Take 1 tablet (40 mg total) by mouth daily.    Breztri Aerosphere 160-9-4.8 MCG/ACT Aero Generic drug: Budeson-Glycopyrrol-Formoterol Inhale 2 puffs into the lungs in the morning and at bedtime.    furosemide 40 MG tablet Commonly known as: LASIX Take 1 tablet (40 mg total) by mouth daily.    guaiFENesin 600 MG 12 hr tablet Commonly known as: Mucinex Take 2 tablets (1,200 mg total) by mouth 2 (two) times daily as needed for cough or to loosen phlegm.    multivitamin with minerals Tabs tablet Take 1 tablet by mouth daily.    nitroGLYCERIN 0.4 MG SL tablet Commonly known as: Nitrostat Place 1 tablet (0.4 mg total) under the tongue every 5 (five) minutes as needed for chest pain.    OXYGEN Inhale 2-3 L into the lungs.    traZODone 100 MG tablet Commonly known as: DESYREL Take 1 tablet (100 mg  total) by mouth at bedtime.             Signature:  Coralyn Helling, MD [facility] Pulmonary/Critical Care Pager - [phone]

## 2022-05-06 NOTE — Telephone Encounter (Signed)
Called and informed Eunice Blase of [place] recommendations and questions. Eunice Blase states she does not want patient to see anyone besides Dr. Craige Cotta so they will wait until hes back in office for him to address this sick message. Eunice Blase is aware this may not be addressed until Monday. Will route to Dr. Craige Cotta. Nothing further needed at this moment.

## 2022-05-06 NOTE — Telephone Encounter (Signed)
I do not see any mention of anxiety in Dr. Evlyn Courier last note. He's also not on any medications for treatment of anxiety that I can see.  Looks as though the trazodone is a medication he's been on for at least a year to help with insomnia. Are the anxiety symptoms new? Is his breathing worse? If so, he really needs OV to discuss/investigate further. Thanks.

## 2022-05-13 MED ORDER — ALPRAZOLAM 0.25 MG PO TABS: 0.2500 mg | ORAL_TABLET | Freq: Three times a day (TID) | ORAL | 2 refills | Status: DC | PRN

## 2022-05-13 NOTE — Telephone Encounter (Signed)
Please let him know that I have sent a script for xanax 0.25 mg tid prn.  Please let him know that this medication can make him sleepy, and he should not use alcohol when using xanax.

## 2022-05-13 NOTE — Addendum Note (Signed)
Addended by: Coralyn Helling on: 05/13/2022 08:40 AM   Modules accepted: Orders

## 2022-05-13 NOTE — Telephone Encounter (Signed)
Called and notified John [name] of Dr. Silverio Lay recs and she voiced understanding. Nothing further needed at this time.

## 2022-05-15 ENCOUNTER — Telehealth: Payer: Self-pay | Admitting: Pulmonary Disease

## 2022-05-15 MED ORDER — AZITHROMYCIN 250 MG PO TABS: ORAL_TABLET | ORAL | 0 refills | Status: AC

## 2022-05-15 NOTE — Telephone Encounter (Signed)
Called and went over recs from Dr. Craige Cotta with Eunice Blase. She voiced understanding. Nothing further needed at this time.

## 2022-05-15 NOTE — Telephone Encounter (Signed)
Okay to send Zpak.  Needs to go to hospital if he gets worse.

## 2022-05-15 NOTE — Telephone Encounter (Signed)
Primary Pulmonologist: Dr. Craige Cotta  Last office visit and with whom: Dr. Craige Cotta  What do we see them for (pulmonary problems): centrilobular emphysema  Last OV assessment/plan: see below   Was appointment offered to patient (explain)?  None available in [facility] office for Dr. Craige Cotta until November. Patient has an appt for 05/30/2022   Reason for call: Eunice Blase came in the office stating patient has had high temps over the last few days. 99.8 to 101.3, most recent 101.0 at 2 p.m. today.  Chest tightness, drainage in throat and fatigue. Using nebs every 4 hours and still Not able to breathe good has been going on since Monday. [facility] [address] Eunice Blase states last time patient  had this going on, she believes he was prescribed a zpak and it helped.  Eunice Blase states he has not been out of his house since Friday and has not been around anyone sick.   Please advise.    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 - he had assessment at [facility] in July 2022 and informed he wasn't a candidate for lung transplant due to coronary artery disease and infrarenal abdominal aortic aneurysm - continue breztri with spacer - prn albuterol - prn mucinex   MZ alpha 1 antitrypsin heterozygote. - his level is sufficient that he does require augmentation therapy   Chronic respiratory failure with hypoxia and hypercapnia. - he uses [employer] for his DME - goal SpO2 > 90% - uses 3 to 4 liters 24/7 - NIV at night and prn during the day   Lung  nodule. - he would like to continue radiographic monitoring - he will need follow up CT chest without contrast in July 2024   Chronic rhinitis. - will have him try flonase and then add azelastine if needed  - if continues, then could also try ipratropium nasal spray - previously seen by Dr. Suszanne Conners with ENT   Insomnia. - continue trazodone 100 mg qhs   Coronary artery disease. - he is followed by Dr. Earney Hamburg with [facility]   Infrarenal abdominal aortic aneurysm. - 6.9 x 6.6 cm on CT abd/pelvis from [facility] in July 2022 - he was advised by surgery at [facility] to pursue conservative management for this   Goals of care. - he was clear he would not want intubation or cardiac resuscitation in the event of cardiac arrest - he is okay to follow up CT imaging, and continue NIMV and supplemental oxygen - he is concerned about dying in the hospital - followed by palliative care

## 2022-05-19 DIAGNOSIS — J449 Chronic obstructive pulmonary disease, unspecified: Secondary | ICD-10-CM | POA: Diagnosis not present

## 2022-05-19 DIAGNOSIS — J9610 Chronic respiratory failure, unspecified whether with hypoxia or hypercapnia: Secondary | ICD-10-CM | POA: Diagnosis not present

## 2022-05-26 DIAGNOSIS — J9611 Chronic respiratory failure with hypoxia: Secondary | ICD-10-CM | POA: Diagnosis not present

## 2022-05-28 ENCOUNTER — Other Ambulatory Visit: Payer: Self-pay

## 2022-05-28 MED ORDER — BREZTRI AEROSPHERE 160-9-4.8 MCG/ACT IN AERO: 2.0000 | INHALATION_SPRAY | Freq: Two times a day (BID) | RESPIRATORY_TRACT | 11 refills | Status: DC

## 2022-05-30 ENCOUNTER — Ambulatory Visit: Payer: Medicare Other | Admitting: Pulmonary Disease

## 2022-05-30 ENCOUNTER — Encounter: Payer: Self-pay | Admitting: Pulmonary Disease

## 2022-05-30 VITALS — BP 112/68 | HR 96 | Temp 98.1°F | Ht 69.0 in | Wt 161.8 lb

## 2022-05-30 DIAGNOSIS — J9612 Chronic respiratory failure with hypercapnia: Secondary | ICD-10-CM | POA: Diagnosis not present

## 2022-05-30 DIAGNOSIS — J9611 Chronic respiratory failure with hypoxia: Secondary | ICD-10-CM | POA: Diagnosis not present

## 2022-05-30 DIAGNOSIS — J432 Centrilobular emphysema: Secondary | ICD-10-CM | POA: Diagnosis not present

## 2022-05-30 NOTE — Progress Notes (Signed)
Pulmonary, Critical Care, and Sleep Medicine  Chief Complaint  Patient presents with   Follow-up    Having increased SOB.  Xanax is helping    Past Surgical History:  He  has a past surgical history that includes Finger surgery (Left); Vasectomy; Coronary angioplasty with stent (09/17/2010; 09/28/2014); Cardiac catheterization (~ 2012); Nasal endoscopy with epistaxis control (Bilateral, 03/2012); and left heart catheterization with coronary angiogram (N/A, 09/28/2014).  Past Medical History:  PNA, HTN, HLD, CAD, Seizure, ETOH, infrarenal abdominal aortic aneurysm  Constitutional:  BP 112/68 (BP Location: Left Arm, Patient Position: Sitting)   Pulse 96   Temp 98.1 F (36.7 C) (Temporal)   Ht 5\' 9"  (1.753 m)   Wt 161 lb 12.8 oz (73.4 kg)   SpO2 94% Comment: 6LO2  BMI 23.89 kg/m   Brief Summary:  John [name] is a 66 y.o. 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      Subjective:   He is here with his wife.  He felt better after getting Zpak in August.  Was told by pharmacy that he shouldn't use Zpak with trazodone, so he stopped trazodone.  Since having xanax his isn't getting anxious, and sleeping better.  Currently not having much cough or chest congestion.  Has some nasal congestion and post nasal drip from allergies.  Planning to get flu shot in October.  Physical Exam:   Appearance - well kempt, sitting in his scooter, wearing oxygen  ENMT - no sinus tenderness, no oral exudate, no LAN, Mallampati 3 airway, no stridor  Respiratory - decreased breath sounds bilaterally, no wheezing or rales  CV - s1s2 regular rate and rhythm, no murmurs  Ext - no clubbing, no edema  Skin - no rashes  Psych - normal mood and affect       Pulmonary testing:  PFT 12/09/19 >> FEV1 .059 (16%), FEV1% 37, DLCO 27% A1AT 03/09/20 >> 110, MZ ABG 02/12/21 >> pH 7.28, PCO2 80.9, PO2 109  on 40% FiO2 PFT 04/11/21 >> FEV1 0.6 (18%), FEV1% 26, DLCO 21%  Chest Imaging:  CT angio chest 11/23/19 >> moderate centrilobular emphysema, mild infrahilar BTX, 6 mm nodule LUL CT chest 01/02/21 >> no change LUL nodule, new 1.1 cm LLL opacity CT chest 03/28/21 >> severe emphysema, 2.2 x 0.8 cm band like opacity RLL from ATX, mucus plugging, 3 mm nodule RUL, 1.6 x 0.7 cm nodularity LUL CT chest 04/09/21 >> diffuse centrilobular emphysema, stable nodule LUL, 8 mm nodule RLL CT chest 03/27/22 >> stable GGO LLL  Cardiac Tests:  Echo 04/19/20 >> EF 65 to 70%  Social History:  He  reports that he quit smoking about 2 years ago. His smoking use included cigarettes. He started smoking about 53 years ago. He has a 100.00 pack-year smoking history. He has never used smokeless tobacco. He reports current alcohol use of about 144.0 standard drinks of alcohol per week. He reports that he does not use drugs.  Family History:  His family history includes Brain cancer in his mother; Heart disease in his father.     Assessment/Plan:   Severe COPD from emphysema. - he had assessment at [facility] in July 2022 and informed he wasn't a candidate for lung transplant due to coronary artery disease and infrarenal abdominal aortic aneurysm - continue breztri with spacer - prn albuterol, mucinex  MZ alpha 1 antitrypsin heterozygote. - his level is sufficient that he does require augmentation  therapy  Chronic respiratory failure with hypoxia and hypercapnia. - he uses [employer] for his DME - goal SpO2 > 90% - 3 to 4 liters 24/7 - NIV at night and prn during the day  Lung nodule. - he would like to continue radiographic monitoring - he will need follow up CT chest without contrast in July 2024  Chronic rhinitis. - will have him try flonase and then add azelastine if needed  - if continues, then could also try ipratropium nasal spray - previously seen by Dr. Suszanne Conners with ENT  Insomnia, anxiety. - prn  xanax  Coronary artery disease. - he is followed by Dr. Earney Hamburg with [facility]  Infrarenal abdominal aortic aneurysm. - 6.9 x 6.6 cm on CT abd/pelvis from [facility] in July 2022 - he was advised by surgery at [facility] to pursue conservative management for this  Goals of care. - he was clear he would not want intubation or cardiac resuscitation in the event of cardiac arrest - he is okay to follow up CT imaging, and continue NIMV and supplemental oxygen - he is concerned about dying in the hospital - followed by palliative care  Time Spent Involved in Patient Care on Day of Examination:  27 minutes  Follow up:   Patient Instructions  Follow up in 2 to 3 months  Medication List:   Allergies as of 05/30/2022       Reactions   Penicillins Other (See Comments)   Passes out        Medication List        Accurate as of May 30, 2022 10:24 AM. If you have any questions, ask your nurse or doctor.          STOP taking these medications    traZODone 100 MG tablet Commonly known as: DESYREL Stopped by: Coralyn Helling, MD       TAKE these medications    albuterol 108 (90 Base) MCG/ACT inhaler Commonly known as: ProAir HFA 2 puffs every 4 hours as needed only  if your can't catch your breath   albuterol (2.5 MG/3ML) 0.083% nebulizer solution Commonly known as: PROVENTIL Take 3 mLs (2.5 mg total) by nebulization every 4 (four) hours as needed for wheezing or shortness of breath.   ALPRAZolam 0.25 MG tablet Commonly known as: XANAX Take 1 tablet (0.25 mg total) by mouth 3 (three) times daily as needed for anxiety.   aspirin EC 81 MG tablet Take 81 mg by mouth daily.   atorvastatin 40 MG tablet Commonly known as: LIPITOR Take 1 tablet (40 mg total) by mouth daily.   Breztri Aerosphere 160-9-4.8 MCG/ACT Aero Generic drug: Budeson-Glycopyrrol-Formoterol Inhale 2 puffs into the lungs in the morning and at bedtime.   furosemide 40 MG tablet Commonly known  as: LASIX Take 1 tablet (40 mg total) by mouth daily.   guaiFENesin 600 MG 12 hr tablet Commonly known as: Mucinex Take 2 tablets (1,200 mg total) by mouth 2 (two) times daily as needed for cough or to loosen phlegm.   multivitamin with minerals Tabs tablet Take 1 tablet by mouth daily.   nitroGLYCERIN 0.4 MG SL tablet Commonly known as: Nitrostat Place 1 tablet (0.4 mg total) under the tongue every 5 (five) minutes as needed for chest pain.   OXYGEN Inhale 2-3 L into the lungs.        Signature:  Coralyn Helling, MD [facility] Pulmonary/Critical Care Pager - [phone] 05/30/2022, 10:24 AM

## 2022-05-30 NOTE — Patient Instructions (Signed)
Follow up in 2 to 3 months

## 2022-06-11 NOTE — Progress Notes (Unsigned)
No chief complaint on file.  History of Present Illness: 66 yo male with history of CAD , HTN, hyperlipidemia, chronic diastolic CHF and severe COPD who is here today for cardiac follow up. He was admitted to [facility] December 2011 with a NSTEMI. Cardiac cath 09/17/10 and was found to have a severe stenosis of the LAD which was treated with a drug eluting stent. He was also found to have moderate disease in the RCA (70%) and Circumflex (70%) managed medically. Cardiac cath June 2012 with stable CAD. He  had 3 admissions in July 2013 with severe epistaxis causing hemorrhagic shock with tachycardia and drop in blood pressure. This was treated with packing and transfusion. His Plavix was stopped. He was admitted January 2016 with unstable angina. Cardiac cath January 2016 with severe mid LAD stenosis, severe mid Circumflex stenosis. I placed a drug eluting stent in the mid LAD and a drug eluting stent in the mid Circumflex. LV function normal. I saw him in May 2016 and he c/o fatigue and dyspnea. His Coreg was stopped and he had resolution of his symptoms. He missed follow up appointments in 2017-2020 due to lack of insurance and cost. I saw him February 2020 with c/o dyspnea and lower extremity edema. He had been off of all medications due to losing his job and having no money. He responded well to Lasix. He has established in our pulmonary office with Dr. Halford Chessman. He was admitted to [facility] July 2021 with acute respiratory failure secondary to COPD exacerbation and community acquired pneumonia and was intubated. He was euvolemic on admission per notes but developed LE edema during his hospital stay. He was discharged on Lasix 40 mg po BID. Echo 04/19/20 with LVEF=65-70%. No valve disease. He has stopped smoking. He is on supplemental O2 24 hours per day now. He was seen at [facility] July 2022 and told he was not a candidate for lung transplant. Cardiac cath at [facility] July 2022 with 60% mid LAD stenosis, patent  LAD stents, patent Circumflex stent. Echo July 2022 at [facility] with normal LV function and no valve disease. CTA abdomen at [facility] with 6.9 cm AAA. He was seen by Vascular surgery there and turned down for intervention. He was told he had 6 months to live and was made DNR and placed on palliative care. He was doing well overall when I saw him in March 2023.   He is here today for follow up. The patient denies any chest pain, dyspnea, palpitations, lower extremity edema, orthopnea, PND, dizziness, near syncope or syncope.   Primary Care Physician: Lindell Spar, MD  Past Medical History:  Diagnosis Date   Alcohol abuse 12/27/2010   Allergic rhinitis, cause unspecified 12/27/2010   Anal warts 04/11/2012   CAD, NATIVE VESSEL 10/04/2010   COPD (chronic obstructive pulmonary disease) ([place])    "a touch" (09/28/2013)   History of blood transfusion 03/2012   related to nose bleed   HYPERLIPIDEMIA-MIXED 10/04/2010   HYPERTENSION, BENIGN 10/04/2010   Myocardial infarction ([place]) 09/16/2010   Pneumonia    "when I was a kid"   Stented coronary artery    Mid LAD    TOBACCO ABUSE 10/04/2010    Past Surgical History:  Procedure Laterality Date   CARDIAC CATHETERIZATION  ~ 2012   CORONARY ANGIOPLASTY WITH STENT PLACEMENT  09/17/2010; 09/28/2014   "1; 2"   FINGER SURGERY Left    "almost cut off" tip of 2nd digit   LEFT HEART CATHETERIZATION  WITH CORONARY ANGIOGRAM N/A 09/28/2014   Procedure: LEFT HEART CATHETERIZATION WITH CORONARY ANGIOGRAM;  Surgeon: Burnell Blanks, MD;  Location: [facility] CATH LAB;  Service: Cardiovascular;  Laterality: N/A;   NASAL ENDOSCOPY WITH EPISTAXIS CONTROL Bilateral 03/2012   VASECTOMY      Current Outpatient Medications  Medication Sig Dispense Refill   albuterol (PROAIR HFA) 108 (90 Base) MCG/ACT inhaler 2 puffs every 4 hours as needed only  if your can't catch your breath 18 g 11   albuterol (PROVENTIL) (2.5 MG/3ML) 0.083% nebulizer solution Take 3 mLs (2.5 mg total) by  nebulization every 4 (four) hours as needed for wheezing or shortness of breath. 360 mL 5   ALPRAZolam (XANAX) 0.25 MG tablet Take 1 tablet (0.25 mg total) by mouth 3 (three) times daily as needed for anxiety. 60 tablet 2   aspirin EC 81 MG tablet Take 81 mg by mouth daily.     atorvastatin (LIPITOR) 40 MG tablet Take 1 tablet (40 mg total) by mouth daily. 90 tablet 3   Budeson-Glycopyrrol-Formoterol (BREZTRI AEROSPHERE) 160-9-4.8 MCG/ACT AERO Inhale 2 puffs into the lungs in the morning and at bedtime. 10.7 g 11   furosemide (LASIX) 40 MG tablet Take 1 tablet (40 mg total) by mouth daily. 90 tablet 3   guaiFENesin (MUCINEX) 600 MG 12 hr tablet Take 2 tablets (1,200 mg total) by mouth 2 (two) times daily as needed for cough or to loosen phlegm.     Multiple Vitamin (MULTIVITAMIN WITH MINERALS) TABS tablet Take 1 tablet by mouth daily.     nitroGLYCERIN (NITROSTAT) 0.4 MG SL tablet Place 1 tablet (0.4 mg total) under the tongue every 5 (five) minutes as needed for chest pain. 25 tablet 6   OXYGEN Inhale 2-3 L into the lungs.     No current facility-administered medications for this visit.    Allergies  Allergen Reactions   Penicillins Other (See Comments)    Passes out    Social History   Socioeconomic History   Marital status: Single    Spouse name: Not on file   Number of children: Not on file   Years of education: Not on file   Highest education level: Not on file  Occupational History   Occupation: Health and safety inspector work     Employer: [employer]    Comment: [name]  Tobacco Use   Smoking status: Former    Packs/day: 2.00    Years: 50.00    Total pack years: 100.00    Types: Cigarettes    Start date: 55    Quit date: 04/18/2020    Years since quitting: 2.1   Smokeless tobacco: Never   Tobacco comments:    2 plus packs per day. He has a 100 + pack - year history of tobacco abuse currently. Former 4 ppd for 25 years.  Vaping Use   Vaping Use: Never used   Substance and Sexual Activity   Alcohol use: Yes    Alcohol/week: 144.0 standard drinks of alcohol    Types: 144 Cans of beer per week    Comment:  history 12 pack of beer per night".  2021- drinks 3 beer/day   Drug use: No   Sexual activity: Yes    Birth control/protection: None  Other Topics Concern   Not on file  Social History Narrative   The patient lives in [place] with his girlfriend. 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 He is not routinely exercising.   Social  Determinants of Health   Financial Resource Strain: Low Risk  (06/02/2021)   Overall Financial Resource Strain (CARDIA)    Difficulty of Paying Living Expenses: Not hard at all  Food Insecurity: No Food Insecurity (06/02/2021)   Hunger Vital Sign    Worried About Running Out of Food in the Last Year: Never true    Ran Out of Food in the Last Year: Never true  Transportation Needs: No Transportation Needs (06/02/2021)   PRAPARE - Administrator, Civil Service (Medical): No    Lack of Transportation (Non-Medical): No  Physical Activity: Inactive (06/02/2021)   Exercise Vital Sign    Days of Exercise per Week: 0 days    Minutes of Exercise per Session: 0 min  Stress: No Stress Concern Present (06/02/2021)   [employer] of Occupational Health - Occupational Stress Questionnaire    Feeling of Stress : Not at all  Social Connections: Socially Isolated (06/02/2021)   Social Connection and Isolation Panel [NHANES]    Frequency of Communication with Friends and Family: More than three times a week    Frequency of Social Gatherings with Friends and Family: More than three times a week    Attends Religious Services: Never    Database administrator or Organizations: No    Attends Banker Meetings: Never    Marital Status: Divorced  Catering manager Violence: Not At Risk (06/02/2021)   Humiliation, Afraid, Rape, and Kick questionnaire    Fear of Current or Ex-Partner: No    Emotionally Abused: No     Physically Abused: No    Sexually Abused: No    Family History  Problem Relation Age of Onset   Brain cancer Mother    Heart disease Father     Review of Systems:  As stated in the HPI and otherwise negative.   There were no vitals taken for this visit.  Physical Examination:  General: Well developed, well nourished, NAD  HEENT: OP clear, mucus membranes moist  SKIN: warm, dry. No rashes. Neuro: No focal deficits  Musculoskeletal: Muscle strength 5/5 all ext  Psychiatric: Mood and affect normal  Neck: No JVD, no carotid bruits, no thyromegaly, no lymphadenopathy.  Lungs:Clear bilaterally, no wheezes, rhonci, crackles Cardiovascular: Regular rate and rhythm. No murmurs, gallops or rubs. Abdomen:Soft. Bowel sounds present. Non-tender.  Extremities: No lower extremity edema. Pulses are 2 + in the bilateral DP/PT.  Echo 04/19/20:  1. Left ventricular ejection fraction, by estimation, is 65 to 70%. The  left ventricle has normal function. The left ventricle has no regional  wall motion abnormalities. Left ventricular diastolic parameters were  normal.   2. Right ventricular systolic function is normal. The right ventricular  size is normal. There is normal pulmonary artery systolic pressure.   3. The mitral valve is normal in structure. No evidence of mitral valve  regurgitation. No evidence of mitral stenosis.   4. The aortic valve is tricuspid. Aortic valve regurgitation is not  visualized. No aortic stenosis is present.   5. The inferior vena cava is normal in size with greater than 50%  respiratory variability, suggesting right atrial pressure of 3 mmHg.   Echo, Cath reports from [facility] are in Care Everywhere  EKG:  EKG is not *** ordered today. The ekg ordered today demonstrates  Recent Labs: 03/22/2022: ALT 23; BUN 13; Creatinine, Ser 0.93; Hemoglobin 11.8; Platelets 216; Potassium 4.9; Sodium 142   Lipid Panel    Component Value Date/Time  CHOL 166 03/22/2022  0910   TRIG 70 03/22/2022 0910   TRIG 82 08/15/2006 0929   HDL 77 03/22/2022 0910   CHOLHDL 2.2 03/22/2022 0910   CHOLHDL 2.3 10/10/2020 0845   VLDL 12 10/10/2020 0845   LDLCALC 76 03/22/2022 0910     Wt Readings from Last 3 Encounters:  05/30/22 161 lb 12.8 oz (73.4 kg)  04/09/22 165 lb (74.8 kg)  04/01/22 164 lb 12.8 oz (74.8 kg)     Other studies Reviewed: Additional studies/ records that were reviewed today include: . Review of the above records demonstrates:    Assessment and Plan:   1. CAD without angina: Cath at [facility] in July 2022 during workup for possible lung transplant. Moderate mid LAD stenosis with patent LAD and Circumflex stents. LV function normal by echo July 2022 at [facility] with no valve disease. He does not tolerate beta blockers due to fatigue. He is now DNR and palliative care is following. He has no chest pain. Will continue ASA and statin.     2. Tobacco abuse, in remission: He has stopped smoking.   3. HTN: BP is well controlled. No changes today  4. Hyperlipidemia: LDL near goal in January 2022. Continue statin. Will not repeat lipids given his poor prognosis with advanced lung disease.   5. Chronic diastolic CHF: LV function normal by echo July 2022. No volume overload. Continue Lasix.   6. Dyspnea/COPD: He is followed in the pulmonary office. Now on continuous supplemental O2. Not a candidate for lung transplant. He is now DNR and palliative care is following  7. AAA: Large 6.9 cm AAA on CTA at [facility]. Seen by vascular surgery there and no plans for intervention given his pulmonary prognosis. He is ok with this. No plans for further imaging  Current medicines are reviewed at length with the patient today.  The patient does not have concerns regarding medicines.  The following changes have been made:  no change  Labs/ tests ordered today include:   No orders of the defined types were placed in this encounter.    Disposition:   F/U with me in 6  months  Signed, Lauree Chandler, MD 06/11/2022 12:46 PM    [address], [place], [place]  [address] Phone: [phone]; Fax: [phone]

## 2022-06-12 ENCOUNTER — Encounter: Payer: Self-pay | Admitting: Cardiovascular Disease

## 2022-06-12 ENCOUNTER — Ambulatory Visit: Payer: Medicare Other | Attending: Cardiovascular Disease | Admitting: Cardiovascular Disease

## 2022-06-12 VITALS — BP 96/62 | HR 94 | Ht 69.5 in | Wt 163.8 lb

## 2022-06-12 DIAGNOSIS — I5032 Chronic diastolic (congestive) heart failure: Secondary | ICD-10-CM

## 2022-06-12 DIAGNOSIS — I714 Abdominal aortic aneurysm, without rupture, unspecified: Secondary | ICD-10-CM

## 2022-06-12 DIAGNOSIS — I2510 Atherosclerotic heart disease of native coronary artery without angina pectoris: Secondary | ICD-10-CM | POA: Diagnosis not present

## 2022-06-12 DIAGNOSIS — I10 Essential (primary) hypertension: Secondary | ICD-10-CM

## 2022-06-12 DIAGNOSIS — E7800 Pure hypercholesterolemia, unspecified: Secondary | ICD-10-CM

## 2022-06-12 NOTE — Patient Instructions (Addendum)
Medication Instructions:  No changes *If you need a refill on your cardiac medications before your next appointment, please call your pharmacy*   Lab Work: none If you have labs (blood work) drawn today and your tests are completely normal, you will receive your results only by: [place] (if you have MyChart) OR A paper copy in the mail If you have any lab test that is abnormal or we need to change your treatment, we will call you to review the results.   Testing/Procedures: none   Follow-Up: At [facility], you and your health needs are our priority.  As part of our continuing mission to provide you with exceptional heart care, we have created designated Provider Care Teams.  These Care Teams include your primary Cardiologist (physician) and Advanced Practice Providers (APPs -  Physician Assistants and Nurse Practitioners) who all work together to provide you with the care you need, when you need it.   Your next appointment:   6 month(s)  The format for your next appointment:   In Person  Provider:   Lauree Chandler, MD  Other Instructions   Important Information About Sugar

## 2022-06-19 DIAGNOSIS — J449 Chronic obstructive pulmonary disease, unspecified: Secondary | ICD-10-CM | POA: Diagnosis not present

## 2022-06-19 DIAGNOSIS — J9610 Chronic respiratory failure, unspecified whether with hypoxia or hypercapnia: Secondary | ICD-10-CM | POA: Diagnosis not present

## 2022-06-25 DIAGNOSIS — J9611 Chronic respiratory failure with hypoxia: Secondary | ICD-10-CM | POA: Diagnosis not present

## 2022-07-19 DIAGNOSIS — J449 Chronic obstructive pulmonary disease, unspecified: Secondary | ICD-10-CM | POA: Diagnosis not present

## 2022-07-19 DIAGNOSIS — J9610 Chronic respiratory failure, unspecified whether with hypoxia or hypercapnia: Secondary | ICD-10-CM | POA: Diagnosis not present

## 2022-07-22 DIAGNOSIS — Z515 Encounter for palliative care: Secondary | ICD-10-CM | POA: Diagnosis not present

## 2022-07-22 DIAGNOSIS — J449 Chronic obstructive pulmonary disease, unspecified: Secondary | ICD-10-CM | POA: Diagnosis not present

## 2022-07-26 DIAGNOSIS — J9611 Chronic respiratory failure with hypoxia: Secondary | ICD-10-CM | POA: Diagnosis not present

## 2022-07-27 ENCOUNTER — Ambulatory Visit
Admission: EM | Admit: 2022-07-27 | Discharge: 2022-07-27 | Disposition: A | Discharge status: Home / Self Care | Payer: Medicare Other

## 2022-07-27 ENCOUNTER — Other Ambulatory Visit: Payer: Self-pay

## 2022-07-27 ENCOUNTER — Encounter: Payer: Self-pay | Admitting: Emergency Medicine

## 2022-07-27 ENCOUNTER — Encounter (HOSPITAL_COMMUNITY): Payer: Self-pay | Admitting: Emergency Medicine

## 2022-07-27 ENCOUNTER — Inpatient Hospital Stay (HOSPITAL_COMMUNITY)
Admission: EM | Admit: 2022-07-27 | Discharge: 2022-07-30 | DRG: 269 | Disposition: A | Discharge status: Home / Self Care | Payer: Medicare Other | Attending: Vascular Surgery | Admitting: Vascular Surgery

## 2022-07-27 DIAGNOSIS — Z955 Presence of coronary angioplasty implant and graft: Secondary | ICD-10-CM | POA: Diagnosis not present

## 2022-07-27 DIAGNOSIS — R112 Nausea with vomiting, unspecified: Secondary | ICD-10-CM | POA: Diagnosis not present

## 2022-07-27 DIAGNOSIS — I713 Abdominal aortic aneurysm, ruptured, unspecified: Secondary | ICD-10-CM

## 2022-07-27 DIAGNOSIS — I714 Abdominal aortic aneurysm, without rupture, unspecified: Secondary | ICD-10-CM | POA: Diagnosis not present

## 2022-07-27 DIAGNOSIS — I7133 Infrarenal abdominal aortic aneurysm, ruptured: Secondary | ICD-10-CM | POA: Diagnosis not present

## 2022-07-27 DIAGNOSIS — J449 Chronic obstructive pulmonary disease, unspecified: Secondary | ICD-10-CM | POA: Diagnosis present

## 2022-07-27 DIAGNOSIS — Z87891 Personal history of nicotine dependence: Secondary | ICD-10-CM | POA: Diagnosis not present

## 2022-07-27 DIAGNOSIS — N1330 Unspecified hydronephrosis: Secondary | ICD-10-CM | POA: Diagnosis not present

## 2022-07-27 DIAGNOSIS — E782 Mixed hyperlipidemia: Secondary | ICD-10-CM | POA: Diagnosis not present

## 2022-07-27 DIAGNOSIS — I7143 Infrarenal abdominal aortic aneurysm, without rupture: Secondary | ICD-10-CM | POA: Diagnosis not present

## 2022-07-27 DIAGNOSIS — R1032 Left lower quadrant pain: Secondary | ICD-10-CM | POA: Diagnosis not present

## 2022-07-27 DIAGNOSIS — K5900 Constipation, unspecified: Secondary | ICD-10-CM | POA: Diagnosis present

## 2022-07-27 DIAGNOSIS — J189 Pneumonia, unspecified organism: Secondary | ICD-10-CM | POA: Diagnosis not present

## 2022-07-27 DIAGNOSIS — Z7951 Long term (current) use of inhaled steroids: Secondary | ICD-10-CM

## 2022-07-27 DIAGNOSIS — Z808 Family history of malignant neoplasm of other organs or systems: Secondary | ICD-10-CM | POA: Diagnosis not present

## 2022-07-27 DIAGNOSIS — Z8249 Family history of ischemic heart disease and other diseases of the circulatory system: Secondary | ICD-10-CM

## 2022-07-27 DIAGNOSIS — D62 Acute posthemorrhagic anemia: Secondary | ICD-10-CM | POA: Diagnosis not present

## 2022-07-27 DIAGNOSIS — J984 Other disorders of lung: Secondary | ICD-10-CM | POA: Diagnosis present

## 2022-07-27 DIAGNOSIS — Z9981 Dependence on supplemental oxygen: Secondary | ICD-10-CM

## 2022-07-27 DIAGNOSIS — R000 Tachycardia, unspecified: Secondary | ICD-10-CM | POA: Diagnosis not present

## 2022-07-27 DIAGNOSIS — I2510 Atherosclerotic heart disease of native coronary artery without angina pectoris: Secondary | ICD-10-CM | POA: Diagnosis not present

## 2022-07-27 DIAGNOSIS — Z79899 Other long term (current) drug therapy: Secondary | ICD-10-CM | POA: Diagnosis not present

## 2022-07-27 DIAGNOSIS — K5730 Diverticulosis of large intestine without perforation or abscess without bleeding: Secondary | ICD-10-CM | POA: Diagnosis not present

## 2022-07-27 DIAGNOSIS — Z7982 Long term (current) use of aspirin: Secondary | ICD-10-CM

## 2022-07-27 DIAGNOSIS — I10 Essential (primary) hypertension: Secondary | ICD-10-CM | POA: Diagnosis present

## 2022-07-27 DIAGNOSIS — I252 Old myocardial infarction: Secondary | ICD-10-CM | POA: Diagnosis not present

## 2022-07-27 DIAGNOSIS — I25119 Atherosclerotic heart disease of native coronary artery with unspecified angina pectoris: Secondary | ICD-10-CM | POA: Diagnosis not present

## 2022-07-27 DIAGNOSIS — I119 Hypertensive heart disease without heart failure: Secondary | ICD-10-CM | POA: Diagnosis not present

## 2022-07-27 LAB — CBC WITH DIFFERENTIAL/PLATELET
Abs Immature Granulocytes: 0.07 10*3/uL (ref 0.00–0.07)
Basophils Absolute: 0 10*3/uL (ref 0.0–0.1)
Basophils Relative: 0 %
Eosinophils Absolute: 0 10*3/uL (ref 0.0–0.5)
Eosinophils Relative: 0 %
HCT: 32.3 % — ABNORMAL LOW (ref 39.0–52.0)
Hemoglobin: 10.5 g/dL — ABNORMAL LOW (ref 13.0–17.0)
Immature Granulocytes: 1 %
Lymphocytes Relative: 6 %
Lymphs Abs: 0.8 10*3/uL (ref 0.7–4.0)
MCH: 30.3 pg (ref 26.0–34.0)
MCHC: 32.5 g/dL (ref 30.0–36.0)
MCV: 93.1 fL (ref 80.0–100.0)
Monocytes Absolute: 1.6 10*3/uL — ABNORMAL HIGH (ref 0.1–1.0)
Monocytes Relative: 11 %
Neutro Abs: 11.6 10*3/uL — ABNORMAL HIGH (ref 1.7–7.7)
Neutrophils Relative %: 82 %
Platelets: 231 10*3/uL (ref 150–400)
RBC: 3.47 MIL/uL — ABNORMAL LOW (ref 4.22–5.81)
RDW: 14.4 % (ref 11.5–15.5)
WBC: 14 10*3/uL — ABNORMAL HIGH (ref 4.0–10.5)
nRBC: 0 % (ref 0.0–0.2)

## 2022-07-27 LAB — COMPREHENSIVE METABOLIC PANEL
ALT: 18 U/L (ref 0–44)
AST: 27 U/L (ref 15–41)
Albumin: 3.3 g/dL — ABNORMAL LOW (ref 3.5–5.0)
Alkaline Phosphatase: 101 U/L (ref 38–126)
Anion gap: 8 (ref 5–15)
BUN: 23 mg/dL (ref 8–23)
CO2: 36 mmol/L — ABNORMAL HIGH (ref 22–32)
Calcium: 9.8 mg/dL (ref 8.9–10.3)
Chloride: 87 mmol/L — ABNORMAL LOW (ref 98–111)
Creatinine, Ser: 1.69 mg/dL — ABNORMAL HIGH (ref 0.61–1.24)
GFR, Estimated: 45 mL/min — ABNORMAL LOW (ref >60)
Glucose, Bld: 122 mg/dL — ABNORMAL HIGH (ref 70–99)
Potassium: 4.2 mmol/L (ref 3.5–5.1)
Sodium: 131 mmol/L — ABNORMAL LOW (ref 135–145)
Total Bilirubin: 0.9 mg/dL (ref 0.3–1.2)
Total Protein: 6.8 g/dL (ref 6.5–8.1)

## 2022-07-27 LAB — URINALYSIS, ROUTINE W REFLEX MICROSCOPIC
Bilirubin Urine: NEGATIVE
Glucose, UA: NEGATIVE mg/dL
Hgb urine dipstick: NEGATIVE
Ketones, ur: NEGATIVE mg/dL
Leukocytes,Ua: NEGATIVE
Nitrite: NEGATIVE
Protein, ur: NEGATIVE mg/dL
Specific Gravity, Urine: 1.015 (ref 1.005–1.030)
pH: 5 (ref 5.0–8.0)

## 2022-07-27 LAB — LIPASE, BLOOD: Lipase: 30 U/L (ref 11–51)

## 2022-07-27 NOTE — ED Notes (Signed)
Patient is being discharged from the Urgent Care and sent to the Emergency Department via POV . Per PA, patient is in need of higher level of care due to groin pain/abnormal vital signs/need for advance imaging. Patient is aware and verbalizes understanding of plan of care.  Vitals:   07/27/22 1520  BP: 126/82  Pulse: (!) 126  Resp: (!) 26  Temp: 98.2 F (36.8 C)  SpO2: 94%

## 2022-07-27 NOTE — ED Provider Triage Note (Signed)
Emergency Medicine Provider Triage Evaluation Note  SHAUNE WESTFALL , a 66 y.o. male  was evaluated in triage.  Pt complains of left lower quadrant abdominal pain.  Symptoms started today.  He was seen at urgent care in [place] and referred to the emergency department.  When he got to the emergency department however, symptoms had improved and he went home, only to have them return.  He has associated nausea.  History of COPD on chronic oxygen.  No history of renal failure.  No history of abdominal surgeries.  Review of Systems  Positive: Abdominal pain Negative: Fever  Physical Exam  BP 121/73 (BP Location: Right Arm)   Pulse (!) 117   Temp 99.3 F (37.4 C) (Oral)   Resp 18   SpO2 100%  Gen:   Awake, appears uncomfortable Resp:  Normal effort  MSK:   Moves extremities without difficulty  Other:  Tachycardia  Medical Decision Making  Medically screening exam initiated at 8:56 PM.  Appropriate orders placed.  Standley Bargo Wrede was informed that the remainder of the evaluation will be completed by another provider, this initial triage assessment does not replace that evaluation, and the importance of remaining in the ED until their evaluation is complete.     Carlisle Cater, PA-C 07/27/22 2058

## 2022-07-27 NOTE — ED Triage Notes (Signed)
Pt reported to ED with c/o Llq abdominal pain that has been ongoing since yesterday. Pt endorses constipation and took mediation to produce stool today.

## 2022-07-27 NOTE — ED Triage Notes (Addendum)
Pt family reports left sided groin pain ever since having BM a few days ago. Pt family reports decreased appetite and "he has never been this sick". Pt noted to be dyspneic with ambulation, on 3 liters at home. Pt Increased oxygen to 4 liters in triage.pt pale.   Consulted PA and at bedside assessing pt.

## 2022-07-28 ENCOUNTER — Inpatient Hospital Stay (HOSPITAL_COMMUNITY): Payer: Medicare Other

## 2022-07-28 ENCOUNTER — Emergency Department (HOSPITAL_COMMUNITY): Payer: Medicare Other | Admitting: Certified Registered Nurse Anesthetist

## 2022-07-28 ENCOUNTER — Emergency Department (HOSPITAL_COMMUNITY): Payer: Medicare Other

## 2022-07-28 ENCOUNTER — Encounter (HOSPITAL_COMMUNITY)
Admission: EM | Disposition: A | Discharge status: Home / Self Care | Payer: Self-pay | Source: Home / Self Care | Attending: Vascular Surgery

## 2022-07-28 ENCOUNTER — Encounter (HOSPITAL_COMMUNITY): Payer: Self-pay | Admitting: Certified Registered"

## 2022-07-28 ENCOUNTER — Other Ambulatory Visit: Payer: Self-pay

## 2022-07-28 DIAGNOSIS — E782 Mixed hyperlipidemia: Secondary | ICD-10-CM | POA: Diagnosis present

## 2022-07-28 DIAGNOSIS — I713 Abdominal aortic aneurysm, ruptured, unspecified: Secondary | ICD-10-CM | POA: Diagnosis present

## 2022-07-28 DIAGNOSIS — Z955 Presence of coronary angioplasty implant and graft: Secondary | ICD-10-CM | POA: Diagnosis not present

## 2022-07-28 DIAGNOSIS — Z808 Family history of malignant neoplasm of other organs or systems: Secondary | ICD-10-CM | POA: Diagnosis not present

## 2022-07-28 DIAGNOSIS — D62 Acute posthemorrhagic anemia: Secondary | ICD-10-CM | POA: Diagnosis not present

## 2022-07-28 DIAGNOSIS — Z79899 Other long term (current) drug therapy: Secondary | ICD-10-CM | POA: Diagnosis not present

## 2022-07-28 DIAGNOSIS — I252 Old myocardial infarction: Secondary | ICD-10-CM

## 2022-07-28 DIAGNOSIS — I25119 Atherosclerotic heart disease of native coronary artery with unspecified angina pectoris: Secondary | ICD-10-CM

## 2022-07-28 DIAGNOSIS — I2510 Atherosclerotic heart disease of native coronary artery without angina pectoris: Secondary | ICD-10-CM | POA: Diagnosis present

## 2022-07-28 DIAGNOSIS — Z7951 Long term (current) use of inhaled steroids: Secondary | ICD-10-CM | POA: Diagnosis not present

## 2022-07-28 DIAGNOSIS — Z7982 Long term (current) use of aspirin: Secondary | ICD-10-CM | POA: Diagnosis not present

## 2022-07-28 DIAGNOSIS — I119 Hypertensive heart disease without heart failure: Secondary | ICD-10-CM

## 2022-07-28 DIAGNOSIS — K5900 Constipation, unspecified: Secondary | ICD-10-CM | POA: Diagnosis present

## 2022-07-28 DIAGNOSIS — J984 Other disorders of lung: Secondary | ICD-10-CM | POA: Diagnosis present

## 2022-07-28 DIAGNOSIS — J189 Pneumonia, unspecified organism: Secondary | ICD-10-CM | POA: Diagnosis not present

## 2022-07-28 DIAGNOSIS — Z8249 Family history of ischemic heart disease and other diseases of the circulatory system: Secondary | ICD-10-CM | POA: Diagnosis not present

## 2022-07-28 DIAGNOSIS — I7133 Infrarenal abdominal aortic aneurysm, ruptured: Secondary | ICD-10-CM | POA: Diagnosis present

## 2022-07-28 DIAGNOSIS — J449 Chronic obstructive pulmonary disease, unspecified: Secondary | ICD-10-CM | POA: Diagnosis present

## 2022-07-28 DIAGNOSIS — Z87891 Personal history of nicotine dependence: Secondary | ICD-10-CM

## 2022-07-28 DIAGNOSIS — I10 Essential (primary) hypertension: Secondary | ICD-10-CM | POA: Diagnosis present

## 2022-07-28 DIAGNOSIS — R112 Nausea with vomiting, unspecified: Secondary | ICD-10-CM | POA: Diagnosis not present

## 2022-07-28 DIAGNOSIS — Z9981 Dependence on supplemental oxygen: Secondary | ICD-10-CM | POA: Diagnosis not present

## 2022-07-28 HISTORY — PX: ABDOMINAL AORTIC ENDOVASCULAR STENT GRAFT: SHX5707

## 2022-07-28 HISTORY — PX: ULTRASOUND GUIDANCE FOR VASCULAR ACCESS: SHX6516

## 2022-07-28 LAB — CBC
HCT: 26.4 % — ABNORMAL LOW (ref 39.0–52.0)
Hemoglobin: 8.5 g/dL — ABNORMAL LOW (ref 13.0–17.0)
MCH: 30.2 pg (ref 26.0–34.0)
MCHC: 32.2 g/dL (ref 30.0–36.0)
MCV: 94 fL (ref 80.0–100.0)
Platelets: 181 10*3/uL (ref 150–400)
RBC: 2.81 MIL/uL — ABNORMAL LOW (ref 4.22–5.81)
RDW: 14.3 % (ref 11.5–15.5)
WBC: 10.6 10*3/uL — ABNORMAL HIGH (ref 4.0–10.5)
nRBC: 0 % (ref 0.0–0.2)

## 2022-07-28 LAB — POCT I-STAT 7, (LYTES, BLD GAS, ICA,H+H)
Acid-Base Excess: 7 mmol/L — ABNORMAL HIGH (ref 0.0–2.0)
Bicarbonate: 36.7 mmol/L — ABNORMAL HIGH (ref 20.0–28.0)
Calcium, Ion: 1.25 mmol/L (ref 1.15–1.40)
HCT: 29 % — ABNORMAL LOW (ref 39.0–52.0)
Hemoglobin: 9.9 g/dL — ABNORMAL LOW (ref 13.0–17.0)
O2 Saturation: 97 %
Patient temperature: 97
Potassium: 4.3 mmol/L (ref 3.5–5.1)
Sodium: 126 mmol/L — ABNORMAL LOW (ref 135–145)
TCO2: 39 mmol/L — ABNORMAL HIGH (ref 22–32)
pCO2 arterial: 80 mmHg (ref 32–48)
pH, Arterial: 7.265 — ABNORMAL LOW (ref 7.35–7.45)
pO2, Arterial: 112 mmHg — ABNORMAL HIGH (ref 83–108)

## 2022-07-28 LAB — BASIC METABOLIC PANEL
Anion gap: 5 (ref 5–15)
BUN: 26 mg/dL — ABNORMAL HIGH (ref 8–23)
CO2: 33 mmol/L — ABNORMAL HIGH (ref 22–32)
Calcium: 8.3 mg/dL — ABNORMAL LOW (ref 8.9–10.3)
Chloride: 88 mmol/L — ABNORMAL LOW (ref 98–111)
Creatinine, Ser: 1.67 mg/dL — ABNORMAL HIGH (ref 0.61–1.24)
GFR, Estimated: 45 mL/min — ABNORMAL LOW (ref >60)
Glucose, Bld: 110 mg/dL — ABNORMAL HIGH (ref 70–99)
Potassium: 4.1 mmol/L (ref 3.5–5.1)
Sodium: 126 mmol/L — ABNORMAL LOW (ref 135–145)

## 2022-07-28 LAB — POCT ACTIVATED CLOTTING TIME
Activated Clotting Time: 215 seconds
Activated Clotting Time: 221 seconds

## 2022-07-28 LAB — PROTIME-INR
INR: 1.1 (ref 0.8–1.2)
INR: 1.2 (ref 0.8–1.2)
Prothrombin Time: 14 seconds (ref 11.4–15.2)
Prothrombin Time: 15 seconds (ref 11.4–15.2)

## 2022-07-28 LAB — APTT
aPTT: 31 seconds (ref 24–36)
aPTT: 32 seconds (ref 24–36)

## 2022-07-28 LAB — MAGNESIUM: Magnesium: 1.7 mg/dL (ref 1.7–2.4)

## 2022-07-28 SURGERY — INSERTION, ENDOVASCULAR STENT GRAFT, AORTA, ABDOMINAL
Anesthesia: Monitor Anesthesia Care | Site: Groin

## 2022-07-28 MED ORDER — ADULT MULTIVITAMIN W/MINERALS CH
1.0000 | ORAL_TABLET | Freq: Every day | ORAL | Status: DC
  Administered 2022-07-28 – 2022-07-30 (×3): 1 via ORAL
  Filled 2022-07-28 (×3): qty 1

## 2022-07-28 MED ORDER — PROPOFOL 10 MG/ML IV BOLUS
INTRAVENOUS | Status: AC
  Filled 2022-07-28: qty 20

## 2022-07-28 MED ORDER — POTASSIUM CHLORIDE CRYS ER 20 MEQ PO TBCR: 20.0000 meq | EXTENDED_RELEASE_TABLET | Freq: Every day | ORAL | Status: DC | PRN

## 2022-07-28 MED ORDER — MIDAZOLAM HCL 2 MG/2ML IJ SOLN
INTRAMUSCULAR | Status: AC
  Filled 2022-07-28: qty 2

## 2022-07-28 MED ORDER — ONDANSETRON HCL 4 MG/2ML IJ SOLN
4.0000 mg | Freq: Four times a day (QID) | INTRAMUSCULAR | Status: DC | PRN
  Administered 2022-07-28: 4 mg via INTRAVENOUS
  Filled 2022-07-28: qty 2

## 2022-07-28 MED ORDER — ALUM & MAG HYDROXIDE-SIMETH 200-200-20 MG/5ML PO SUSP: 15.0000 mL | ORAL | Status: DC | PRN

## 2022-07-28 MED ORDER — MAGNESIUM SULFATE 2 GM/50ML IV SOLN: 2.0000 g | Freq: Every day | INTRAVENOUS | Status: DC | PRN

## 2022-07-28 MED ORDER — ONDANSETRON HCL 4 MG/2ML IJ SOLN
4.0000 mg | Freq: Once | INTRAMUSCULAR | Status: AC
  Administered 2022-07-28: 4 mg via INTRAVENOUS
  Filled 2022-07-28: qty 2

## 2022-07-28 MED ORDER — LABETALOL HCL 5 MG/ML IV SOLN
5.0000 mg | Freq: Once | INTRAVENOUS | Status: AC
  Administered 2022-07-28: 5 mg via INTRAVENOUS

## 2022-07-28 MED ORDER — ALBUTEROL SULFATE (2.5 MG/3ML) 0.083% IN NEBU: 2.5000 mg | INHALATION_SOLUTION | RESPIRATORY_TRACT | Status: DC | PRN

## 2022-07-28 MED ORDER — HYDROMORPHONE HCL 1 MG/ML IJ SOLN: 0.5000 mg | INTRAMUSCULAR | Status: DC | PRN

## 2022-07-28 MED ORDER — HEPARIN SODIUM (PORCINE) 1000 UNIT/ML IJ SOLN
INTRAMUSCULAR | Status: DC | PRN
  Administered 2022-07-28 (×2): 2000 [IU] via INTRAVENOUS
  Administered 2022-07-28: 5000 [IU] via INTRAVENOUS

## 2022-07-28 MED ORDER — UMECLIDINIUM BROMIDE 62.5 MCG/ACT IN AEPB
1.0000 | INHALATION_SPRAY | Freq: Every day | RESPIRATORY_TRACT | Status: DC
  Filled 2022-07-28: qty 7

## 2022-07-28 MED ORDER — TRAZODONE HCL 50 MG PO TABS: 100.0000 mg | ORAL_TABLET | Freq: Every day | ORAL | Status: DC

## 2022-07-28 MED ORDER — ONDANSETRON HCL 4 MG/2ML IJ SOLN
INTRAMUSCULAR | Status: DC | PRN
  Administered 2022-07-28: 4 mg via INTRAVENOUS

## 2022-07-28 MED ORDER — ETOMIDATE 2 MG/ML IV SOLN
INTRAVENOUS | Status: AC
  Filled 2022-07-28: qty 10

## 2022-07-28 MED ORDER — PANTOPRAZOLE SODIUM 40 MG PO TBEC
40.0000 mg | DELAYED_RELEASE_TABLET | Freq: Every day | ORAL | Status: DC
  Administered 2022-07-28 – 2022-07-29 (×2): 40 mg via ORAL
  Filled 2022-07-28 (×3): qty 1

## 2022-07-28 MED ORDER — FENTANYL CITRATE (PF) 100 MCG/2ML IJ SOLN: 25.0000 ug | INTRAMUSCULAR | Status: DC | PRN

## 2022-07-28 MED ORDER — FENTANYL CITRATE PF 50 MCG/ML IJ SOSY
50.0000 ug | PREFILLED_SYRINGE | Freq: Once | INTRAMUSCULAR | Status: AC
  Administered 2022-07-28: 50 ug via INTRAVENOUS
  Filled 2022-07-28: qty 1

## 2022-07-28 MED ORDER — LIDOCAINE HCL 1 % IJ SOLN
INTRAMUSCULAR | Status: DC | PRN
  Administered 2022-07-28: 8 mL

## 2022-07-28 MED ORDER — HEPARIN 6000 UNIT IRRIGATION SOLUTION
Status: DC | PRN
  Administered 2022-07-28: 1

## 2022-07-28 MED ORDER — SODIUM CHLORIDE 0.9 % IV SOLN: INTRAVENOUS | Status: AC

## 2022-07-28 MED ORDER — LACTATED RINGERS IV SOLN: INTRAVENOUS | Status: DC | PRN

## 2022-07-28 MED ORDER — CHLORHEXIDINE GLUCONATE CLOTH 2 % EX PADS
6.0000 | MEDICATED_PAD | Freq: Every day | CUTANEOUS | Status: DC
  Administered 2022-07-28 – 2022-07-30 (×3): 6 via TOPICAL

## 2022-07-28 MED ORDER — BISACODYL 10 MG RE SUPP: 10.0000 mg | Freq: Every day | RECTAL | Status: DC | PRN

## 2022-07-28 MED ORDER — NITROGLYCERIN 0.4 MG SL SUBL: 0.4000 mg | SUBLINGUAL_TABLET | SUBLINGUAL | Status: DC | PRN

## 2022-07-28 MED ORDER — BUDESON-GLYCOPYRROL-FORMOTEROL 160-9-4.8 MCG/ACT IN AERO: 2.0000 | INHALATION_SPRAY | Freq: Two times a day (BID) | RESPIRATORY_TRACT | Status: DC

## 2022-07-28 MED ORDER — GUAIFENESIN-DM 100-10 MG/5ML PO SYRP: 15.0000 mL | ORAL_SOLUTION | ORAL | Status: DC | PRN

## 2022-07-28 MED ORDER — ACETAMINOPHEN 650 MG RE SUPP: 325.0000 mg | RECTAL | Status: DC | PRN

## 2022-07-28 MED ORDER — FENTANYL CITRATE (PF) 250 MCG/5ML IJ SOLN
INTRAMUSCULAR | Status: DC | PRN
  Administered 2022-07-28 (×2): 50 ug via INTRAVENOUS

## 2022-07-28 MED ORDER — PROTAMINE SULFATE 10 MG/ML IV SOLN
INTRAVENOUS | Status: DC | PRN
  Administered 2022-07-28: 50 mg via INTRAVENOUS

## 2022-07-28 MED ORDER — FENTANYL CITRATE (PF) 250 MCG/5ML IJ SOLN
INTRAMUSCULAR | Status: AC
  Filled 2022-07-28: qty 5

## 2022-07-28 MED ORDER — FUROSEMIDE 40 MG PO TABS
40.0000 mg | ORAL_TABLET | Freq: Every day | ORAL | Status: DC
  Administered 2022-07-28 – 2022-07-30 (×3): 40 mg via ORAL
  Filled 2022-07-28 (×3): qty 1

## 2022-07-28 MED ORDER — METOPROLOL TARTRATE 5 MG/5ML IV SOLN: 2.0000 mg | INTRAVENOUS | Status: DC | PRN

## 2022-07-28 MED ORDER — MIDAZOLAM HCL 2 MG/2ML IJ SOLN
INTRAMUSCULAR | Status: DC | PRN
  Administered 2022-07-28: 1 mg via INTRAVENOUS
  Administered 2022-07-28 (×2): .5 mg via INTRAVENOUS

## 2022-07-28 MED ORDER — IOHEXOL 350 MG/ML SOLN
75.0000 mL | Freq: Once | INTRAVENOUS | Status: AC | PRN
  Administered 2022-07-28: 75 mL via INTRAVENOUS

## 2022-07-28 MED ORDER — FLUTICASONE FUROATE-VILANTEROL 100-25 MCG/ACT IN AEPB
1.0000 | INHALATION_SPRAY | Freq: Every day | RESPIRATORY_TRACT | Status: DC
  Filled 2022-07-28: qty 28

## 2022-07-28 MED ORDER — VANCOMYCIN HCL IN DEXTROSE 1-5 GM/200ML-% IV SOLN
1000.0000 mg | Freq: Two times a day (BID) | INTRAVENOUS | Status: AC
  Administered 2022-07-28 – 2022-07-29 (×2): 1000 mg via INTRAVENOUS
  Filled 2022-07-28 (×2): qty 200

## 2022-07-28 MED ORDER — PHENOL 1.4 % MT LIQD: 1.0000 | OROMUCOSAL | Status: DC | PRN

## 2022-07-28 MED ORDER — HEPARIN SODIUM (PORCINE) 5000 UNIT/ML IJ SOLN
5000.0000 [IU] | Freq: Three times a day (TID) | INTRAMUSCULAR | Status: DC
  Administered 2022-07-28 – 2022-07-30 (×7): 5000 [IU] via SUBCUTANEOUS
  Filled 2022-07-28 (×7): qty 1

## 2022-07-28 MED ORDER — OXYCODONE-ACETAMINOPHEN 5-325 MG PO TABS
1.0000 | ORAL_TABLET | ORAL | Status: DC | PRN
  Administered 2022-07-29 (×2): 1 via ORAL
  Filled 2022-07-28 (×3): qty 1

## 2022-07-28 MED ORDER — IODIXANOL 320 MG/ML IV SOLN
INTRAVENOUS | Status: DC | PRN
  Administered 2022-07-28: 90.6 mL
  Administered 2022-07-28: 50 mL

## 2022-07-28 MED ORDER — DOCUSATE SODIUM 100 MG PO CAPS
100.0000 mg | ORAL_CAPSULE | Freq: Every day | ORAL | Status: DC
  Administered 2022-07-29 – 2022-07-30 (×2): 100 mg via ORAL
  Filled 2022-07-28 (×2): qty 1

## 2022-07-28 MED ORDER — ATORVASTATIN CALCIUM 40 MG PO TABS
40.0000 mg | ORAL_TABLET | Freq: Every day | ORAL | Status: DC
  Administered 2022-07-28 – 2022-07-30 (×3): 40 mg via ORAL
  Filled 2022-07-28 (×3): qty 1

## 2022-07-28 MED ORDER — ACETAMINOPHEN 325 MG PO TABS: 325.0000 mg | ORAL_TABLET | ORAL | Status: DC | PRN

## 2022-07-28 MED ORDER — ASPIRIN 81 MG PO TBEC
81.0000 mg | DELAYED_RELEASE_TABLET | Freq: Every day | ORAL | Status: DC
  Administered 2022-07-28 – 2022-07-30 (×3): 81 mg via ORAL
  Filled 2022-07-28 (×3): qty 1

## 2022-07-28 MED ORDER — POLYETHYLENE GLYCOL 3350 17 G PO PACK
17.0000 g | PACK | Freq: Every day | ORAL | Status: DC | PRN
  Administered 2022-07-29 – 2022-07-30 (×2): 17 g via ORAL
  Filled 2022-07-28 (×2): qty 1

## 2022-07-28 MED ORDER — ALBUTEROL SULFATE HFA 108 (90 BASE) MCG/ACT IN AERS: 2.0000 | INHALATION_SPRAY | RESPIRATORY_TRACT | Status: DC | PRN

## 2022-07-28 MED ORDER — LABETALOL HCL 5 MG/ML IV SOLN
INTRAVENOUS | Status: AC
  Filled 2022-07-28: qty 4

## 2022-07-28 MED ORDER — ALPRAZOLAM 0.25 MG PO TABS: 0.2500 mg | ORAL_TABLET | Freq: Three times a day (TID) | ORAL | Status: DC | PRN

## 2022-07-28 MED ORDER — HYDRALAZINE HCL 20 MG/ML IJ SOLN: 5.0000 mg | INTRAMUSCULAR | Status: DC | PRN

## 2022-07-28 MED ORDER — CEFAZOLIN SODIUM-DEXTROSE 2-3 GM-%(50ML) IV SOLR
INTRAVENOUS | Status: DC | PRN
  Administered 2022-07-28: 2 g via INTRAVENOUS

## 2022-07-28 MED ORDER — SODIUM CHLORIDE 0.9 % IV SOLN: 500.0000 mL | Freq: Once | INTRAVENOUS | Status: DC | PRN

## 2022-07-28 MED ORDER — LABETALOL HCL 5 MG/ML IV SOLN: 10.0000 mg | INTRAVENOUS | Status: DC | PRN

## 2022-07-28 SURGICAL SUPPLY — 72 items
BAG COUNTER SPONGE SURGICOUNT (BAG) ×2 IMPLANT
BAG SPNG CNTER NS LX DISP (BAG) ×2
BLADE CLIPPER SURG (BLADE) ×2 IMPLANT
CANISTER SUCT 3000ML PPV (MISCELLANEOUS) ×2 IMPLANT
CATH ANGIO 5F BER2 65CM (CATHETERS) ×1 IMPLANT
CATH BEACON 5 .035 65 KMP TIP (CATHETERS) ×1 IMPLANT
CATH OMNI FLUSH .035X70CM (CATHETERS) ×1 IMPLANT
CATH VANSCH 5FR 6CM (CATHETERS) ×1 IMPLANT
CLIP LIGATING EXTRA MED SLVR (CLIP) ×2 IMPLANT
CLIP LIGATING EXTRA SM BLUE (MISCELLANEOUS) ×4 IMPLANT
CLOSURE PERCLOSE PROSTYLE (VASCULAR PRODUCTS) ×4 IMPLANT
DEVICE TORQUE H2O (MISCELLANEOUS) ×1 IMPLANT
DEVICE TORQUE KENDALL .025-038 (MISCELLANEOUS) ×1 IMPLANT
DRYSEAL FLEXSHEATH 12FR 33CM (SHEATH) ×2
DRYSEAL FLEXSHEATH 16FR 33CM (SHEATH) ×2
ELECT BLADE 4.0 EZ CLEAN MEGAD (MISCELLANEOUS) ×2
ELECT BLADE 6.5 EXT (BLADE) IMPLANT
ELECT REM PT RETURN 9FT ADLT (ELECTROSURGICAL) ×2
ELECTRODE BLDE 4.0 EZ CLN MEGD (MISCELLANEOUS) ×2 IMPLANT
ELECTRODE REM PT RTRN 9FT ADLT (ELECTROSURGICAL) ×2 IMPLANT
EXCLDR TRNK ENDO 26X14.5X12 16 (Endovascular Graft) ×2 IMPLANT
EXCLUDER TNK END 26X14.5X12 16 (Endovascular Graft) ×1 IMPLANT
FELT TEFLON 1X6 (MISCELLANEOUS) ×2 IMPLANT
GLIDEWIRE ADV .035X180CM (WIRE) ×2 IMPLANT
GLOVE BIO SURGEON STRL SZ7.5 (GLOVE) ×2 IMPLANT
GLOVE BIOGEL PI IND STRL 8 (GLOVE) ×2 IMPLANT
GLOVE SRG 8 PF TXTR STRL LF DI (GLOVE) ×2 IMPLANT
GLOVE SURG POLYISO LF SZ8 (GLOVE) IMPLANT
GLOVE SURG UNDER POLY LF SZ8 (GLOVE) ×2
GOWN STRL REUS W/ TWL LRG LVL3 (GOWN DISPOSABLE) ×6 IMPLANT
GOWN STRL REUS W/TWL 2XL LVL3 (GOWN DISPOSABLE) ×4 IMPLANT
GOWN STRL REUS W/TWL LRG LVL3 (GOWN DISPOSABLE) ×6
GRAFT BALLN CATH 65CM (STENTS) ×1 IMPLANT
GUIDEWIRE ANGLED .035X150CM (WIRE) ×1 IMPLANT
INSERT FOGARTY 61MM (MISCELLANEOUS) ×4 IMPLANT
INSERT FOGARTY SM (MISCELLANEOUS) ×8 IMPLANT
KIT BASIN OR (CUSTOM PROCEDURE TRAY) ×2 IMPLANT
KIT TURNOVER KIT B (KITS) ×2 IMPLANT
LEG CONTRALATERAL 16X16X9.5 (Endovascular Graft) ×1 IMPLANT
LEG CONTRALATERAL 20X11.5 (Vascular Products) ×2 IMPLANT
NS IRRIG 1000ML POUR BTL (IV SOLUTION) ×4 IMPLANT
PACK AORTA (CUSTOM PROCEDURE TRAY) ×2 IMPLANT
PAD ARMBOARD 7.5X6 YLW CONV (MISCELLANEOUS) ×4 IMPLANT
RETAINER VISCERA MED (MISCELLANEOUS) ×2 IMPLANT
SET MICROPUNCTURE 5F STIFF (MISCELLANEOUS) ×1 IMPLANT
SHEATH BRITE TIP 8FR 23CM (SHEATH) ×1 IMPLANT
SHEATH DRYSEAL FLEX 12FR 33CM (SHEATH) ×1 IMPLANT
SHEATH DRYSEAL FLEX 16FR 33CM (SHEATH) ×1 IMPLANT
SHEATH GUIDING 6.5 FR 55X73X9 (SHEATH) ×1 IMPLANT
SHEATH GUIDING 7F 55X73X9MM TD (SHEATH) ×1 IMPLANT
SHEATH PINNACLE 8F 10CM (SHEATH) ×2 IMPLANT
STAPLER VISISTAT 35W (STAPLE) ×4 IMPLANT
STENT GRAFT BALLN CATH 65CM (STENTS) ×2
STENT GRAFT CONTRALAT 20X11.5 (Vascular Products) ×1 IMPLANT
SUT ETHIBOND 5 LR DA (SUTURE) IMPLANT
SUT MNCRL AB 4-0 PS2 18 (SUTURE) ×1 IMPLANT
SUT PDS AB 1 TP1 96 (SUTURE) ×4 IMPLANT
SUT PROLENE 3 0 SH1 36 (SUTURE) ×2 IMPLANT
SUT PROLENE 5 0 C 1 24 (SUTURE) IMPLANT
SUT PROLENE 5 0 C 1 36 (SUTURE) IMPLANT
SUT SILK 2 0 SH CR/8 (SUTURE) ×2 IMPLANT
SUT VIC AB 2-0 CT1 36 (SUTURE) ×4 IMPLANT
SUT VIC AB 3-0 SH 27 (SUTURE)
SUT VIC AB 3-0 SH 27X BRD (SUTURE) IMPLANT
SYR CONTROL 10ML LL (SYRINGE) ×1 IMPLANT
SYR MEDRAD MARK V 150ML (SYRINGE) ×1 IMPLANT
TOWEL GREEN STERILE (TOWEL DISPOSABLE) ×2 IMPLANT
TOWEL ~~LOC~~+RFID 17X26 BLUE (SPONGE) ×4 IMPLANT
TRAY FOLEY MTR SLVR 16FR STAT (SET/KITS/TRAYS/PACK) ×2 IMPLANT
WATER STERILE IRR 1000ML POUR (IV SOLUTION) ×4 IMPLANT
WIRE AMPLATZ SS-J .035X180CM (WIRE) ×2 IMPLANT
WIRE BENTSON .035X145CM (WIRE) ×1 IMPLANT

## 2022-07-28 NOTE — Anesthesia Procedure Notes (Signed)
Arterial Line Insertion Start/End11/01/2022 9:05 AM, 07/28/2022 9:08 AM Performed by: Rande Brunt, CRNA  Patient location: OR. Preanesthetic checklist: patient identified, IV checked, site marked, risks and benefits discussed, surgical consent, monitors and equipment checked, pre-op evaluation, timeout performed and anesthesia consent Lidocaine 1% used for infiltration Right, radial was placed Catheter size: 20 G Hand hygiene performed  and maximum sterile barriers used   Attempts: 1 Procedure performed without using ultrasound guided technique. Following insertion, dressing applied and Biopatch. Post procedure assessment: normal and unchanged

## 2022-07-28 NOTE — Anesthesia Postprocedure Evaluation (Signed)
Anesthesia Post Note  Patient: John [name]  Procedure(s) Performed: ABDOMINAL AORTIC ENDOVASCULAR STENT GRAFT (Groin) ULTRASOUND GUIDANCE FOR VASCULAR ACCESS (Groin)     Patient location during evaluation: PACU Anesthesia Type: MAC Level of consciousness: awake Pain management: pain level controlled Vital Signs Assessment: post-procedure vital signs reviewed and stable Respiratory status: spontaneous breathing Cardiovascular status: stable Postop Assessment: no apparent nausea or vomiting Anesthetic complications: no   No notable events documented.  Last Vitals:  Vitals:   07/28/22 1400 07/28/22 1445  BP: 131/81 118/81  Pulse: 97 99  Resp: (!) 22 (!) 29  Temp: 37.1 C   SpO2: 100% 98%    Last Pain:  Vitals:   07/28/22 1400  TempSrc: Oral  PainSc: 0-No pain                 Preslei Blakley

## 2022-07-28 NOTE — Op Note (Signed)
NAME: John [name]    MRN: [id number] DOB: [birth year]    DATE OF OPERATION: 07/28/2022  PREOP DIAGNOSIS:    Ruptured infrarenal abdominal aortic aneurysm  POSTOP DIAGNOSIS:    Same  PROCEDURE:    01601 Bilateral common femoral artery ultrasound-guided percutaneous access 34706 Endovascular aortic repair for rupture  SURGEON: Broadus John  ASSIST: Leontine Locket, PA  ANESTHESIA: Moderate  EBL: 100  INDICATIONS:    John [name] is a 66 y.o. male who presented with a 1 day history of abdominal pain.  Imaging demonstrated rupture of known infrarenal abdominal aortic aneurysm which has been followed at [facility]. After discussing the risk and benefits of endovascular repair in an effort to save his life, John [name] elected to proceed  FINDINGS:   10 cm ruptured infrarenal abdominal aortic aneurysm  TECHNIQUE:   The patient was brought to the operating room after informed consent was obtained. After placement of IV's and arterial line moderate anesthesia was  induced. The patient was prepped and draped in the usual sterile fashion. Peri-operative antibiotics were given. A time-out was performed.   Bilateral percutaneous access of the CFA was obtained with ultrasound guidance. 2 perclose devices were placed in the pre-close technique and the arteriotomy was up-sized to a 8 french sheath over GWA wires. The patient was heparinized with 5000 units of IV heparin. A pigtale catheter was advanced into the aorta and an aortogram was obtained. This demonstrated widely patent renal arteries, the left filled better than the right. There was a long aortic neck. Bilateral hypogastric arteries were patent. Length measurements from pre-op CT scan were confirmed. The right side sheath was up-sized to an 16 french dry seal sheath over a Glide Advantage wire. A 69mmx 4mmx 12cm GORE conformable c3 main body was positioned in the infrarenal aorta. Another aortogram was performed under  magnification to define the renal artery origins. The graft was deployed. The contralateral limb was difficult to cannulate requiring significant time and an oscor catheter. Once advanced into the graft and wire withdrawn the flush catheter was freely moving. An arteriogram was performed and documented the graft was in a good infrarenal location and that the right renal artery was patent. This was double confirmation that we had selected the contralateral limb. A 12 french sheath was then advanced into the contralateral limb over a Glide Advantage wire. A  103mm x 11.5cm limb was inserted on the left side and deployed per the IFU. The reminder of the main body was deployed, followed by 16 x 9.5cm limb extension. A compliant balloon was used to dilate the main body proximal and distal seal zones as well as the contralateral overlap with the main body and the distal seal zone. A completion aortogram was obtained which revealed patent renal arteries and the left hypogastric arteries well as patent EVAR with no evidence of type I A, IB, II or type III endoleak. This was felt to be an excellent result. The arteriotomies were closed over a wire using the previously placed perc-close devices with excellent hemostasis, prior to complete closure bilateral femoral arteriograms were obtained via a micropuncture sheath demonstrating adequate access within the common femoral arteries with no access site complications. Bilateral femoral angiogram showed patent femoral bifurcations without access complication. Pedal pulses were checked and found to be no different from pre-op. 50 mg of protamine was given. Surgicel was packed into the sheath tracks. The skin was closed with 4-0 monocryl suture. Dry sterile dressing was placed.  General anesthesia was successfully terminated. The patient was transported to the recovery room in stable position  Impression: Successful endovascular repair of infrarenal abdominal aortic  rupture  Macie Burows, MD [facility] DATE OF DICTATION:   07/28/2022

## 2022-07-28 NOTE — Progress Notes (Signed)
I responded to a call from the nurse to lead the patient's family from the ED to the [place] waiting room. I provided spiritual care through pastoral presence, sharing words of encouragement, and by leading in prayer.    07/28/22 0936  Clinical Encounter Type  Visited With Family;Patient not available  Visit Type Initial;Spiritual support  Referral From Nurse  Consult/Referral To Chaplain  Spiritual Encounters  Spiritual Needs Prayer     Chaplain Dr Redgie Grayer

## 2022-07-28 NOTE — Transfer of Care (Signed)
Immediate Anesthesia Transfer of Care Note  Patient: John [name]  Procedure(s) Performed: ABDOMINAL AORTIC ENDOVASCULAR STENT GRAFT (Groin) ULTRASOUND GUIDANCE FOR VASCULAR ACCESS (Groin)  Patient Location: PACU  Anesthesia Type:MAC  Level of Consciousness: awake, alert , and oriented  Airway & Oxygen Therapy: Patient Spontanous Breathing and Patient connected to nasal cannula oxygen  Post-op Assessment: Report given to RN and Post -op Vital signs reviewed and stable  Post vital signs: Reviewed and stable  Last Vitals:  Vitals Value Taken Time  BP    Temp    Pulse    Resp    SpO2      Last Pain:  Vitals:   07/27/22 2100  TempSrc:   PainSc: 10-Worst pain ever         Complications: No notable events documented.

## 2022-07-28 NOTE — ED Notes (Signed)
Vascular at bedside

## 2022-07-28 NOTE — Discharge Instructions (Signed)
  [facility] [place]   Discharge Instructions  Endovascular Aortic Aneurysm Repair  Please refer to the following instructions for your post-procedure care. Your surgeon or Physician Assistant will discuss any changes with you.  Activity  You are encouraged to walk as much as you can. You can slowly return to normal activities but must avoid strenuous activity and heavy lifting until your doctor tells you it's OK. Avoid activities such as vacuuming or swinging a gold club. It is normal to feel tired for several weeks after your surgery. Do not drive until your doctor gives the OK and you are no longer taking prescription pain medications. It is also normal to have difficulty with sleep habits, eating, and bowel movements after surgery. These will go away with time.  Bathing/Showering  Shower daily after you go home.  Do not soak in a bathtub, hot tub, or swim until the incision heals completely.  If you have incisions in your groin, wash the groin wounds with soap and water daily and pat dry. (No tub bath-only shower)  Then put a dry gauze or washcloth there to keep this area dry to help prevent wound infection daily and as needed.  Do not use Vaseline or neosporin on your incisions.  Only use soap and water on your incisions and then protect and keep dry.  Incision Care  Shower every day. Clean your incision with mild soap and water. Pat the area dry with a clean towel. You do not need a bandage unless otherwise instructed. Do not apply any ointments or creams to your incision. If you clothing is irritating, you may cover your incision with a dry gauze pad.  Diet  Resume your normal diet. There are no special food restrictions following this procedure. A low fat/low cholesterol diet is recommended for all patients with vascular disease. In order to heal from your surgery, it is CRITICAL to get adequate nutrition. Your body requires vitamins, minerals, and protein.  Vegetables are the best source of vitamins and minerals. Vegetables also provide the perfect balance of protein. Processed food has little nutritional value, so try to avoid this.  Medications  Resume taking all of your medications unless your doctor or nurse practitioner tells you not to. If your incision is causing pain, you may take over-the-counter pain relievers such as acetaminophen (Tylenol). If you were prescribed a stronger pain medication, please be aware these medications can cause nausea and constipation. Prevent nausea by taking the medication with a snack or meal. Avoid constipation by drinking plenty of fluids and eating foods with a high amount of fiber, such as fruits, vegetables, and grains.  Do not take Tylenol if you are taking prescription pain medications.   Follow up  Our office will schedule a follow-up appointment with a CT scan 3-4 weeks after your surgery.  Please call us immediately for any of the following conditions  Severe or worsening pain in your legs or feet or in your abdomen back or chest. Increased pain, redness, drainage (pus) from your incision site. Increased abdominal pain, bloating, nausea, vomiting or persistent diarrhea. Fever of 101 degrees or higher. Swelling in your leg (s),  Reduce your risk of vascular disease  Stop smoking. If you would like help call [facility] at 1-800-QUIT-NOW (1-800-784-8669) or [place] at [phone]. Manage your cholesterol Maintain a desired weight Control your diabetes Keep your blood pressure down  If you have questions, please call the office at [phone].  

## 2022-07-28 NOTE — ED Notes (Signed)
Pt taken to OR with this RN. Family taken to waiting room and all belongings given to family.

## 2022-07-28 NOTE — Anesthesia Procedure Notes (Signed)
Procedure Name: MAC Date/Time: 07/28/2022 9:18 AM  Performed by: Babs Bertin, CRNAPre-anesthesia Checklist: Patient identified, Emergency Drugs available, Suction available and Patient being monitored Patient Re-evaluated:Patient Re-evaluated prior to induction Oxygen Delivery Method: Simple face mask Preoxygenation: Pre-oxygenation with 100% oxygen Placement Confirmation: positive ETCO2 and CO2 detector Dental Injury: Teeth and Oropharynx as per pre-operative assessment

## 2022-07-28 NOTE — Anesthesia Preprocedure Evaluation (Addendum)
Anesthesia Evaluation  Patient identified by MRN, date of birth, ID band Patient awake  General Assessment Comment:Full stomach. Risks discussed with patient and Dr. Virl Cagey Dr. Nicholes Mango documentation limited or incomplete due to emergent nature of procedure.  Airway Mallampati: II       Dental   Pulmonary pneumonia, COPD, Patient abstained from smoking., former smoker   breath sounds clear to auscultation       Cardiovascular hypertension, + angina  + CAD and + Past MI   Rhythm:Regular Rate:Normal  Hx noted Dr. Nyoka Cowden   Neuro/Psych    GI/Hepatic History noted Dr. Nyoka Cowden Full stomach. Risks discussed of full stomach. Dr. Nyoka Cowden   Endo/Other  negative endocrine ROS    Renal/GU Hx noted Dr. Nyoka Cowden     Musculoskeletal   Abdominal   Peds  Hematology   Anesthesia Other Findings   Reproductive/Obstetrics                             Anesthesia Physical Anesthesia Plan  ASA: 4  Anesthesia Plan: MAC   Post-op Pain Management: Tylenol PO (pre-op)*   Induction: Intravenous  PONV Risk Score and Plan: Treatment may vary due to age or medical condition, Ondansetron and Dexamethasone  Airway Management Planned: Nasal Cannula and Simple Face Mask  Additional Equipment:   Intra-op Plan:   Post-operative Plan:   Informed Consent: I have reviewed the patients History and Physical, chart, labs and discussed the procedure including the risks, benefits and alternatives for the proposed anesthesia with the patient or authorized representative who has indicated his/her understanding and acceptance.     Dental advisory given  Plan Discussed with: CRNA and Anesthesiologist  Anesthesia Plan Comments:        Anesthesia Quick Evaluation

## 2022-07-28 NOTE — ED Provider Notes (Signed)
[facility] EMERGENCY DEPARTMENT Provider Note   CSN: [id number] Arrival date & time: 07/27/22  2005     History  No chief complaint on file.   John [name] is a 66 y.o. male.  Patient is a 66 year old male with a history of end-stage COPD on 4 L of oxygen chronically, hyperlipidemia, hypertension, CAD and known AAA who is presenting today with 2 days of abdominal pain.  Patient reports the pain is in the left lower quadrant and had been gradually worsening over the last 2 days.  He was seen at urgent care yesterday and they recommended he come to the emergency room.  He came to the emergency room yesterday afternoon and initially the pain resolved so he went home but then he reports the pain came back and was severe and arrived in the emergency room around 8 PM last night and has been waiting until now.  He is having waves of intense crippling pain that caused him to double over that and also some back pain.  The pain is not moved from the left lower quadrant.  He denies any numbness or tingling in his legs.  He has no new shortness of breath or syncope.  He denies any chest pain.  He does not take any anticoagulation.  The history is provided by the patient and medical records.       Home Medications Prior to Admission medications   Medication Sig Start Date End Date Taking? Authorizing Provider  albuterol (PROAIR HFA) 108 (90 Base) MCG/ACT inhaler 2 puffs every 4 hours as needed only  if your can't catch your breath 06/18/21   Chesley Mires, MD  albuterol (PROVENTIL) (2.5 MG/3ML) 0.083% nebulizer solution Take 3 mLs (2.5 mg total) by nebulization every 4 (four) hours as needed for wheezing or shortness of breath. 12/18/21   Chesley Mires, MD  ALPRAZolam Duanne Moron) 0.25 MG tablet Take 1 tablet (0.25 mg total) by mouth 3 (three) times daily as needed for anxiety. 05/13/22   Chesley Mires, MD  aspirin EC 81 MG tablet Take 81 mg by mouth daily.    [provider]   atorvastatin (LIPITOR) 40 MG tablet Take 1 tablet (40 mg total) by mouth daily. 12/05/21   Burnell Blanks, MD  Budeson-Glycopyrrol-Formoterol (BREZTRI AEROSPHERE) 160-9-4.8 MCG/ACT AERO Inhale 2 puffs into the lungs in the morning and at bedtime. 05/28/22   Chesley Mires, MD  furosemide (LASIX) 40 MG tablet Take 1 tablet (40 mg total) by mouth daily. 12/05/21   Burnell Blanks, MD  guaiFENesin (MUCINEX) 600 MG 12 hr tablet Take 2 tablets (1,200 mg total) by mouth 2 (two) times daily as needed for cough or to loosen phlegm. 01/19/21   Chesley Mires, MD  Multiple Vitamin (MULTIVITAMIN WITH MINERALS) TABS tablet Take 1 tablet by mouth daily.    [provider]  nitroGLYCERIN (NITROSTAT) 0.4 MG SL tablet Place 1 tablet (0.4 mg total) under the tongue every 5 (five) minutes as needed for chest pain. 12/05/21   Burnell Blanks, MD  OXYGEN Inhale 2-3 L into the lungs.    [provider]  TRAZODONE HCL PO Take 1 tablet by mouth. Pt unsure of dosage    [provider]      Allergies    Penicillins    Review of Systems   Review of Systems  Physical Exam Updated Vital Signs BP 129/83   Pulse (!) 118   Temp 98 F (36.7 C)   Resp (!)  26   SpO2 100%  Physical Exam Vitals and nursing note reviewed.  Constitutional:      General: He is not in acute distress.    Appearance: He is well-developed. He is ill-appearing.  HENT:     Head: Normocephalic and atraumatic.  Eyes:     Conjunctiva/sclera: Conjunctivae normal.     Pupils: Pupils are equal, round, and reactive to light.  Cardiovascular:     Rate and Rhythm: Regular rhythm. Tachycardia present.     Pulses: Normal pulses.     Heart sounds: No murmur heard. Pulmonary:     Effort: Pulmonary effort is normal. Tachypnea present. No respiratory distress.     Breath sounds: Decreased air movement present. Wheezing present. No rales.  Abdominal:     General: There is no distension.     Palpations:  Abdomen is soft.     Tenderness: There is abdominal tenderness in the left lower quadrant. There is left CVA tenderness. There is no guarding or rebound.  Musculoskeletal:        General: No tenderness. Normal range of motion.     Cervical back: Normal range of motion and neck supple.  Skin:    General: Skin is warm and dry.     Findings: No erythema or rash.  Neurological:     Mental Status: He is alert and oriented to person, place, and time. Mental status is at baseline.  Psychiatric:        Mood and Affect: Mood normal.        Behavior: Behavior normal.     ED Results / Procedures / Treatments   Labs (all labs ordered are listed, but only abnormal results are displayed) Labs Reviewed  CBC WITH DIFFERENTIAL/PLATELET - Abnormal; Notable for the following components:      Result Value   WBC 14.0 (*)    RBC 3.47 (*)    Hemoglobin 10.5 (*)    HCT 32.3 (*)    Neutro Abs 11.6 (*)    Monocytes Absolute 1.6 (*)    All other components within normal limits  COMPREHENSIVE METABOLIC PANEL - Abnormal; Notable for the following components:   Sodium 131 (*)    Chloride 87 (*)    CO2 36 (*)    Glucose, Bld 122 (*)    Creatinine, Ser 1.69 (*)    Albumin 3.3 (*)    GFR, Estimated 45 (*)    All other components within normal limits  URINALYSIS, ROUTINE W REFLEX MICROSCOPIC - Abnormal; Notable for the following components:   APPearance HAZY (*)    All other components within normal limits  LIPASE, BLOOD  PROTIME-INR  APTT  TYPE AND SCREEN    EKG None  Radiology CT ABDOMEN PELVIS W CONTRAST  Result Date: 07/28/2022 CLINICAL DATA:  Left lower quadrant pain and left flank pain beginning yesterday. Constipation. EXAM: CT ABDOMEN AND PELVIS WITH CONTRAST TECHNIQUE: Multidetector CT imaging of the abdomen and pelvis was performed using the standard protocol following bolus administration of intravenous contrast. RADIATION DOSE REDUCTION: This exam was performed according to the  departmental dose-optimization program which includes automated exposure control, adjustment of the mA and/or kV according to patient size and/or use of iterative reconstruction technique. CONTRAST:  42mL OMNIPAQUE IOHEXOL 350 MG/ML SOLN COMPARISON:  None Available. FINDINGS: Lower Chest: No acute findings. Hepatobiliary: Tiny sub-cm low-attenuation lesion in the anterior left hepatic lobe is too small to characterize, but most likely represents a tiny cyst. No other hepatic lesions identified. Gallbladder  is unremarkable. No evidence of biliary ductal dilatation. Pancreas:  No mass or inflammatory changes. Spleen: Within normal limits in size and appearance. Adrenals/Urinary Tract: No suspicious masses identified. No evidence of urolithiasis. Mild left hydronephrosis is seen due to retroperitoneal hemorrhage and abdominal aortic aneurysm described below. Stomach/Bowel: No evidence of obstruction, inflammatory process or abnormal fluid collections. Diverticulosis is seen mainly involving the descending and sigmoid colon, however there is no evidence of diverticulitis. Vascular/Lymphatic: No pathologically enlarged lymph nodes. Infrarenal abdominal aortic aneurysm is seen measuring 10.2 cm in maximum diameter. Mild adjacent retroperitoneal hemorrhage is seen in the left abdomen, consistent with aneurysm rupture. Reproductive:  Mildly enlarged prostate. Other:  None. Musculoskeletal:  No suspicious bone lesions identified. IMPRESSION: Ruptured 10.2 cm infrarenal abdominal aortic aneurysm, with mild retroperitoneal hemorrhage. Mild left hydronephrosis due to retroperitoneal hemorrhage from ruptured abdominal aortic aneurysm. Colonic diverticulosis, without radiographic evidence of diverticulitis. Mildly enlarged prostate. Critical Value/emergent results were called by telephone at the time of interpretation on 07/28/2022 at 8:14 am to ED triage nurse [place], who verbally acknowledged these results. Electronically  Signed   By: Danae Orleans M.D.   On: 07/28/2022 08:18    Procedures Procedures    Medications Ordered in ED Medications  iohexol (OMNIPAQUE) 350 MG/ML injection 75 mL (75 mLs Intravenous Contrast Given 07/28/22 0734)  ondansetron (ZOFRAN) injection 4 mg (4 mg Intravenous Given 07/28/22 0836)  fentaNYL (SUBLIMAZE) injection 50 mcg (50 mcg Intravenous Given 07/28/22 6222)    ED Course/ Medical Decision Making/ A&P                           Medical Decision Making Amount and/or Complexity of Data Reviewed Independent Historian: spouse External Data Reviewed: notes. Labs: ordered. Decision-making details documented in ED Course. Radiology: ordered and independent interpretation performed. Decision-making details documented in ED Course.  Risk Prescription drug management. Decision regarding hospitalization.   Pt with multiple medical problems and comorbidities and presenting today with a complaint that caries a high risk for morbidity and mortality.  Here today with abdominal pain that abruptly worsened.  Patient has a known history of a AAA and concern for AAA rupture versus bowel perforation versus renal stone versus infectious etiology.  Patient has been tachycardic but has been hemodynamically stable.  He unfortunately waited for 14 hours and had his work-up done through the emergency room waiting room.  I independently interpreted patient's labs and he has a leukocytosis of 14, normal PT INR and PTT, normal UA, stable hemoglobin of 10.5, new AKI with creatinine of 1.69 normal blood sugar and normal LFTs.  Lipase is within normal limits.  I have independently visualized and interpreted pt's images today.  CT of the abdomen pelvis large AAA with associated hemorrhage.  Radiology reports a ruptured 10.2 cm infrarenal abdominal aortic aneurysm with mild retroperitoneal hemorrhage.  There is some mild left hydronephrosis due to retroperitoneal hemorrhage.  As soon as patient was brought back  from the waiting room vascular surgery was consulted.  Dr. Sherral Hammers evaluated the patient.  The patient does wish to proceed with surgery knowing that the risks are high.  He remains hemodynamically stable at this time.  He was given pain control.  Findings discussed with the patient and his family who are present at bedside.  They are all in agreement to proceed.          Final Clinical Impression(s) / ED Diagnoses Final diagnoses:  Ruptured infrarenal abdominal aortic aneurysm (  AAA) [facility])    Rx / DC Orders ED Discharge Orders     None         Gwyneth Sprout, MD 07/28/22 [phone]

## 2022-07-28 NOTE — Consult Note (Signed)
Hospital Consult    Reason for Consult: Aortic rupture Requesting Physician: ED MRN #:  [id number]  History of Present Illness: This is a 66 y.o. male with known infrarenal abdominal aneurysm, being followed at [facility].  Pulmonary insufficiency.  Patient noted abdominal pain yesterday, abdominal pain is continued.  CT scan demonstrated infrarenal abdominal aortic aneurysm with rupture. Patient wants everything done  Past Medical History:  Diagnosis Date   Alcohol abuse 12/27/2010   Allergic rhinitis, cause unspecified 12/27/2010   Anal warts 04/11/2012   CAD, NATIVE VESSEL 10/04/2010   COPD (chronic obstructive pulmonary disease) (HCC)    "a touch" (09/28/2013)   History of blood transfusion 03/2012   related to nose bleed   HYPERLIPIDEMIA-MIXED 10/04/2010   HYPERTENSION, BENIGN 10/04/2010   Myocardial infarction (HCC) 09/16/2010   Pneumonia    "when I was a kid"   Stented coronary artery    Mid LAD    TOBACCO ABUSE 10/04/2010    Past Surgical History:  Procedure Laterality Date   CARDIAC CATHETERIZATION  ~ 2012   CORONARY ANGIOPLASTY WITH STENT PLACEMENT  09/17/2010; 09/28/2014   "1; 2"   FINGER SURGERY Left    "almost cut off" tip of 2nd digit   LEFT HEART CATHETERIZATION WITH CORONARY ANGIOGRAM N/A 09/28/2014   Procedure: LEFT HEART CATHETERIZATION WITH CORONARY ANGIOGRAM;  Surgeon: Kathleene Hazel, MD;  Location: [facility] CATH LAB;  Service: Cardiovascular;  Laterality: N/A;   NASAL ENDOSCOPY WITH EPISTAXIS CONTROL Bilateral 03/2012   VASECTOMY      Allergies  Allergen Reactions   Penicillins Other (See Comments)    Passes out    Prior to Admission medications   Medication Sig Start Date End Date Taking? Authorizing Provider  albuterol (PROAIR HFA) 108 (90 Base) MCG/ACT inhaler 2 puffs every 4 hours as needed only  if your can't catch your breath 06/18/21   Coralyn Helling, MD  albuterol (PROVENTIL) (2.5 MG/3ML) 0.083% nebulizer solution Take 3 mLs (2.5 mg total) by nebulization  every 4 (four) hours as needed for wheezing or shortness of breath. 12/18/21   Coralyn Helling, MD  ALPRAZolam Prudy Feeler) 0.25 MG tablet Take 1 tablet (0.25 mg total) by mouth 3 (three) times daily as needed for anxiety. 05/13/22   Coralyn Helling, MD  aspirin EC 81 MG tablet Take 81 mg by mouth daily.    [provider]  atorvastatin (LIPITOR) 40 MG tablet Take 1 tablet (40 mg total) by mouth daily. 12/05/21   Kathleene Hazel, MD  Budeson-Glycopyrrol-Formoterol (BREZTRI AEROSPHERE) 160-9-4.8 MCG/ACT AERO Inhale 2 puffs into the lungs in the morning and at bedtime. 05/28/22   Coralyn Helling, MD  furosemide (LASIX) 40 MG tablet Take 1 tablet (40 mg total) by mouth daily. 12/05/21   Kathleene Hazel, MD  guaiFENesin (MUCINEX) 600 MG 12 hr tablet Take 2 tablets (1,200 mg total) by mouth 2 (two) times daily as needed for cough or to loosen phlegm. 01/19/21   Coralyn Helling, MD  Multiple Vitamin (MULTIVITAMIN WITH MINERALS) TABS tablet Take 1 tablet by mouth daily.    [provider]  nitroGLYCERIN (NITROSTAT) 0.4 MG SL tablet Place 1 tablet (0.4 mg total) under the tongue every 5 (five) minutes as needed for chest pain. 12/05/21   Kathleene Hazel, MD  OXYGEN Inhale 2-3 L into the lungs.    [provider]  TRAZODONE HCL PO Take 1 tablet by mouth. Pt unsure of dosage    [provider]    Social History  Socioeconomic History   Marital status: Single    Spouse name: Not on file   Number of children: Not on file   Years of education: Not on file   Highest education level: Not on file  Occupational History   Occupation: Dealer maintenance work     Employer: [employer]    Comment: [name]  Tobacco Use   Smoking status: Former    Packs/day: 2.00    Years: 50.00    Total pack years: 100.00    Types: Cigarettes    Start date: 46    Quit date: 04/18/2020    Years since quitting: 2.2   Smokeless tobacco: Never   Tobacco comments:    2 plus packs  per day. He has a 100 + pack - year history of tobacco abuse currently. Former 4 ppd for 25 years.  Vaping Use   Vaping Use: Never used  Substance and Sexual Activity   Alcohol use: Yes    Alcohol/week: 144.0 standard drinks of alcohol    Types: 144 Cans of beer per week    Comment:  history 12 pack of beer per night".  2021- drinks 3 beer/day   Drug use: No   Sexual activity: Yes    Birth control/protection: None  Other Topics Concern   Not on file  Social History Narrative   The patient lives in [place] with his girlfriend. 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 He is not routinely exercising.   Social Determinants of Health   Financial Resource Strain: Low Risk  (06/02/2021)   Overall Financial Resource Strain (CARDIA)    Difficulty of Paying Living Expenses: Not hard at all  Food Insecurity: No Food Insecurity (06/02/2021)   Hunger Vital Sign    Worried About Running Out of Food in the Last Year: Never true    Ran Out of Food in the Last Year: Never true  Transportation Needs: No Transportation Needs (06/02/2021)   PRAPARE - Hydrologist (Medical): No    Lack of Transportation (Non-Medical): No  Physical Activity: Inactive (06/02/2021)   Exercise Vital Sign    Days of Exercise per Week: 0 days    Minutes of Exercise per Session: 0 min  Stress: No Stress Concern Present (06/02/2021)   [place]    Feeling of Stress : Not at all  Social Connections: Socially Isolated (06/02/2021)   Social Connection and Isolation Panel [NHANES]    Frequency of Communication with Friends and Family: More than three times a week    Frequency of Social Gatherings with Friends and Family: More than three times a week    Attends Religious Services: Never    Marine scientist or Organizations: No    Attends Archivist Meetings: Never    Marital Status: Divorced  Human resources officer Violence: Not  At Risk (06/02/2021)   Humiliation, Afraid, Rape, and Kick questionnaire    Fear of Current or Ex-Partner: No    Emotionally Abused: No    Physically Abused: No    Sexually Abused: No   Family History  Problem Relation Age of Onset   Brain cancer Mother    Heart disease Father     ROS: Otherwise negative unless mentioned in HPI  Physical Examination  Vitals:   07/28/22 0842 07/28/22 0849  BP: 129/83 130/73  Pulse: (!) 118 (!) 115  Resp: (!) 26 19  Temp:  SpO2: 100% 99%   Body mass index is 23.75 kg/m.  General:  WDWN in NAD Gait: Not observed HENT: WNL, normocephalic Pulmonary: normal non-labored breathing, without Rales, rhonchi,  wheezing Cardiac: regular Abdomen: soft, NT/ND, no masses Skin: without rashes Vascular Exam/Pulses: 2+ femoral pulses Extremities: without ischemic changes, without Gangrene , without cellulitis; without open wounds;  Musculoskeletal: no muscle wasting or atrophy  Neurologic: A&O X 3;  No focal weakness or paresthesias are detected; speech is fluent/normal Psychiatric:  The pt has Normal affect. Lymph:  Unremarkable  CBC    Component Value Date/Time   WBC 14.0 (H) 07/27/2022 2106   RBC 3.47 (L) 07/27/2022 2106   HGB 10.5 (L) 07/27/2022 2106   HGB 11.8 (L) 03/22/2022 0910   HCT 32.3 (L) 07/27/2022 2106   HCT 36.9 (L) 03/22/2022 0910   PLT 231 07/27/2022 2106   PLT 216 03/22/2022 0910   MCV 93.1 07/27/2022 2106   MCV 96 03/22/2022 0910   MCH 30.3 07/27/2022 2106   MCHC 32.5 07/27/2022 2106   RDW 14.4 07/27/2022 2106   RDW 12.7 03/22/2022 0910   LYMPHSABS 0.8 07/27/2022 2106   LYMPHSABS 1.5 03/22/2022 0910   MONOABS 1.6 (H) 07/27/2022 2106   EOSABS 0.0 07/27/2022 2106   EOSABS 0.1 03/22/2022 0910   BASOSABS 0.0 07/27/2022 2106   BASOSABS 0.1 03/22/2022 0910    BMET    Component Value Date/Time   NA 131 (L) 07/27/2022 2106   NA 142 03/22/2022 0910   K 4.2 07/27/2022 2106   CL 87 (L) 07/27/2022 2106   CO2 36 (H)  07/27/2022 2106   GLUCOSE 122 (H) 07/27/2022 2106   GLUCOSE 123 (H) 08/15/2006 0929   BUN 23 07/27/2022 2106   BUN 13 03/22/2022 0910   CREATININE 1.69 (H) 07/27/2022 2106   CALCIUM 9.8 07/27/2022 2106   GFRNONAA 45 (L) 07/27/2022 2106   GFRAA >60 04/23/2020 0832    COAGS: Lab Results  Component Value Date   INR 0.9 09/27/2014   INR 0.88 03/28/2012   INR 1.0 03/06/2011     ASSESSMENT/PLAN: This is a 66 y.o. male patient with large infrarenal abdominal aneurysm-ruptured.  Needs emergency surgery.  After discussing the risk and benefits of endovascular aortic repair, Khameron elected to proceed. OR aware before initiation of this note  Fara Olden MD MS [facility] [phone] 07/28/2022  8:59 AM

## 2022-07-29 ENCOUNTER — Encounter (HOSPITAL_COMMUNITY): Payer: Self-pay | Admitting: Vascular Surgery

## 2022-07-29 LAB — CBC
HCT: 25.2 % — ABNORMAL LOW (ref 39.0–52.0)
Hemoglobin: 8.2 g/dL — ABNORMAL LOW (ref 13.0–17.0)
MCH: 30.5 pg (ref 26.0–34.0)
MCHC: 32.5 g/dL (ref 30.0–36.0)
MCV: 93.7 fL (ref 80.0–100.0)
Platelets: 187 10*3/uL (ref 150–400)
RBC: 2.69 MIL/uL — ABNORMAL LOW (ref 4.22–5.81)
RDW: 14.6 % (ref 11.5–15.5)
WBC: 9.4 10*3/uL (ref 4.0–10.5)
nRBC: 0 % (ref 0.0–0.2)

## 2022-07-29 LAB — BASIC METABOLIC PANEL
Anion gap: 10 (ref 5–15)
BUN: 31 mg/dL — ABNORMAL HIGH (ref 8–23)
CO2: 32 mmol/L (ref 22–32)
Calcium: 8.8 mg/dL — ABNORMAL LOW (ref 8.9–10.3)
Chloride: 89 mmol/L — ABNORMAL LOW (ref 98–111)
Creatinine, Ser: 1.79 mg/dL — ABNORMAL HIGH (ref 0.61–1.24)
GFR, Estimated: 42 mL/min — ABNORMAL LOW (ref >60)
Glucose, Bld: 115 mg/dL — ABNORMAL HIGH (ref 70–99)
Potassium: 4.6 mmol/L (ref 3.5–5.1)
Sodium: 131 mmol/L — ABNORMAL LOW (ref 135–145)

## 2022-07-29 NOTE — Progress Notes (Addendum)
  Progress Note    07/29/2022 8:06 AM 1 Day Post-Op  Subjective:  no complaints   Vitals:   07/29/22 0700 07/29/22 0730  BP: 99/77 102/71  Pulse: (!) 106 (!) 107  Resp: (!) 24 (!) 27  Temp:    SpO2: 95% 97%   Physical Exam: Lungs:  non labored on 3L Incisions:  B groin incisions c/d/i Extremities:  palpable L DP; unable to palpate R but foot is warm with good femoral pulse Abdomen:  soft, NT Neurologic: A&O  CBC    Component Value Date/Time   WBC 9.4 07/29/2022 0411   RBC 2.69 (L) 07/29/2022 0411   HGB 8.2 (L) 07/29/2022 0411   HGB 11.8 (L) 03/22/2022 0910   HCT 25.2 (L) 07/29/2022 0411   HCT 36.9 (L) 03/22/2022 0910   PLT 187 07/29/2022 0411   PLT 216 03/22/2022 0910   MCV 93.7 07/29/2022 0411   MCV 96 03/22/2022 0910   MCH 30.5 07/29/2022 0411   MCHC 32.5 07/29/2022 0411   RDW 14.6 07/29/2022 0411   RDW 12.7 03/22/2022 0910   LYMPHSABS 0.8 07/27/2022 2106   LYMPHSABS 1.5 03/22/2022 0910   MONOABS 1.6 (H) 07/27/2022 2106   EOSABS 0.0 07/27/2022 2106   EOSABS 0.1 03/22/2022 0910   BASOSABS 0.0 07/27/2022 2106   BASOSABS 0.1 03/22/2022 0910    BMET    Component Value Date/Time   NA 131 (L) 07/29/2022 0411   NA 142 03/22/2022 0910   K 4.6 07/29/2022 0411   CL 89 (L) 07/29/2022 0411   CO2 32 07/29/2022 0411   GLUCOSE 115 (H) 07/29/2022 0411   GLUCOSE 123 (H) 08/15/2006 0929   BUN 31 (H) 07/29/2022 0411   BUN 13 03/22/2022 0910   CREATININE 1.79 (H) 07/29/2022 0411   CALCIUM 8.8 (L) 07/29/2022 0411   GFRNONAA 42 (L) 07/29/2022 0411   GFRAA >60 04/23/2020 0832    INR    Component Value Date/Time   INR 1.2 07/28/2022 1150     Intake/Output Summary (Last 24 hours) at 07/29/2022 0806 Last data filed at 07/29/2022 0700 Gross per 24 hour  Intake 1464.14 ml  Output 1305 ml  Net 159.14 ml     Assessment/Plan:  66 y.o. male is s/p endo repair of ruptured AAA 1 Day Post-Op   BLE warm and well perfused; groin cath sites without hematoma Voiding  after foley removed; continue to follow renal function; continue gentle IVF; daily BMP OOB Transfer to 4e   Dagoberto Ligas, PA-C [facility] [phone] 07/29/2022 8:06 AM  Doing well. Eating. Pain improved s/p EVAR Watching Cr, 3L [place] baseline.  Will observe today, likely home tomorrow.  Aggressive pulmonary toilet I would like an interval CT scan s/p rupture, however do to his renal function this may have to wait until the outpt setting.   Broadus John MD

## 2022-07-30 LAB — PREPARE FRESH FROZEN PLASMA
Unit division: 0
Unit division: 0
Unit division: 0

## 2022-07-30 LAB — BPAM FFP
Blood Product Expiration Date: 202311062359
Blood Product Expiration Date: 202311062359
Blood Product Expiration Date: 202311062359
Blood Product Expiration Date: 202311062359
ISSUE DATE / TIME: 202311050857
ISSUE DATE / TIME: 202311050857
ISSUE DATE / TIME: 202311050857
ISSUE DATE / TIME: 202311050857
Unit Type and Rh: 2800
Unit Type and Rh: 6200
Unit Type and Rh: 6200
Unit Type and Rh: 6200

## 2022-07-30 LAB — BASIC METABOLIC PANEL
Anion gap: 10 (ref 5–15)
BUN: 31 mg/dL — ABNORMAL HIGH (ref 8–23)
CO2: 34 mmol/L — ABNORMAL HIGH (ref 22–32)
Calcium: 8.6 mg/dL — ABNORMAL LOW (ref 8.9–10.3)
Chloride: 86 mmol/L — ABNORMAL LOW (ref 98–111)
Creatinine, Ser: 1.6 mg/dL — ABNORMAL HIGH (ref 0.61–1.24)
GFR, Estimated: 48 mL/min — ABNORMAL LOW (ref >60)
Glucose, Bld: 96 mg/dL (ref 70–99)
Potassium: 4.5 mmol/L (ref 3.5–5.1)
Sodium: 130 mmol/L — ABNORMAL LOW (ref 135–145)

## 2022-07-30 MED ORDER — SENNA 8.6 MG PO TABS: 1.0000 | ORAL_TABLET | Freq: Every day | ORAL | 0 refills | Status: DC | PRN

## 2022-07-30 MED ORDER — DOCUSATE SODIUM 100 MG PO CAPS: 100.0000 mg | ORAL_CAPSULE | Freq: Every day | ORAL | 0 refills | Status: DC

## 2022-07-30 NOTE — Progress Notes (Signed)
  Progress Note    07/30/2022 8:57 AM 2 Days Post-Op  Subjective:  N/V this morning   Vitals:   07/30/22 0600 07/30/22 0700  BP: (!) 104/55 111/77  Pulse: 87 91  Resp: (!) 21 17  Temp:    SpO2: 100% 95%   Physical Exam: Lungs:  non labored on 3L by [place] Incisions:  B groins without hematoma Extremities:  palpable L DP; R foot warm and well perfused Abdomen:  soft, NT, ND Neurologic: a&O  CBC    Component Value Date/Time   WBC 9.4 07/29/2022 0411   RBC 2.69 (L) 07/29/2022 0411   HGB 8.2 (L) 07/29/2022 0411   HGB 11.8 (L) 03/22/2022 0910   HCT 25.2 (L) 07/29/2022 0411   HCT 36.9 (L) 03/22/2022 0910   PLT 187 07/29/2022 0411   PLT 216 03/22/2022 0910   MCV 93.7 07/29/2022 0411   MCV 96 03/22/2022 0910   MCH 30.5 07/29/2022 0411   MCHC 32.5 07/29/2022 0411   RDW 14.6 07/29/2022 0411   RDW 12.7 03/22/2022 0910   LYMPHSABS 0.8 07/27/2022 2106   LYMPHSABS 1.5 03/22/2022 0910   MONOABS 1.6 (H) 07/27/2022 2106   EOSABS 0.0 07/27/2022 2106   EOSABS 0.1 03/22/2022 0910   BASOSABS 0.0 07/27/2022 2106   BASOSABS 0.1 03/22/2022 0910    BMET    Component Value Date/Time   NA 130 (L) 07/30/2022 0414   NA 142 03/22/2022 0910   K 4.5 07/30/2022 0414   CL 86 (L) 07/30/2022 0414   CO2 34 (H) 07/30/2022 0414   GLUCOSE 96 07/30/2022 0414   GLUCOSE 123 (H) 08/15/2006 0929   BUN 31 (H) 07/30/2022 0414   BUN 13 03/22/2022 0910   CREATININE 1.60 (H) 07/30/2022 0414   CALCIUM 8.6 (L) 07/30/2022 0414   GFRNONAA 48 (L) 07/30/2022 0414   GFRAA >60 04/23/2020 0832    INR    Component Value Date/Time   INR 1.2 07/28/2022 1150     Intake/Output Summary (Last 24 hours) at 07/30/2022 0857 Last data filed at 07/30/2022 0700 Gross per 24 hour  Intake 570 ml  Output 1625 ml  Net -1055 ml     Assessment/Plan:  66 y.o. male is s/p endovascular repair of ruptured AAA 2 Days Post-Op   BLE well perfused Renal function stabilized; good urine output N/V this morning; hold  narcotics Ambulate patient Home if N/V resolves and tolerating a diet   Dagoberto Ligas, PA-C [facility] [phone] 07/30/2022 8:57 AM

## 2022-07-30 NOTE — Progress Notes (Signed)
  Progress Note    07/30/2022 10:44 AM 2 Days Post-Op  Subjective:  N/V this morning   Vitals:   07/30/22 0600 07/30/22 0700  BP: (!) 104/55 111/77  Pulse: 87 91  Resp: (!) 21 17  Temp:    SpO2: 100% 95%   Physical Exam: Lungs:  non labored on 3L by [place] Incisions:  B groins without hematoma Extremities:  palpable L DP; R foot warm and well perfused Abdomen:  soft, NT, ND Neurologic: a&O  CBC    Component Value Date/Time   WBC 9.4 07/29/2022 0411   RBC 2.69 (L) 07/29/2022 0411   HGB 8.2 (L) 07/29/2022 0411   HGB 11.8 (L) 03/22/2022 0910   HCT 25.2 (L) 07/29/2022 0411   HCT 36.9 (L) 03/22/2022 0910   PLT 187 07/29/2022 0411   PLT 216 03/22/2022 0910   MCV 93.7 07/29/2022 0411   MCV 96 03/22/2022 0910   MCH 30.5 07/29/2022 0411   MCHC 32.5 07/29/2022 0411   RDW 14.6 07/29/2022 0411   RDW 12.7 03/22/2022 0910   LYMPHSABS 0.8 07/27/2022 2106   LYMPHSABS 1.5 03/22/2022 0910   MONOABS 1.6 (H) 07/27/2022 2106   EOSABS 0.0 07/27/2022 2106   EOSABS 0.1 03/22/2022 0910   BASOSABS 0.0 07/27/2022 2106   BASOSABS 0.1 03/22/2022 0910    BMET    Component Value Date/Time   NA 130 (L) 07/30/2022 0414   NA 142 03/22/2022 0910   K 4.5 07/30/2022 0414   CL 86 (L) 07/30/2022 0414   CO2 34 (H) 07/30/2022 0414   GLUCOSE 96 07/30/2022 0414   GLUCOSE 123 (H) 08/15/2006 0929   BUN 31 (H) 07/30/2022 0414   BUN 13 03/22/2022 0910   CREATININE 1.60 (H) 07/30/2022 0414   CALCIUM 8.6 (L) 07/30/2022 0414   GFRNONAA 48 (L) 07/30/2022 0414   GFRAA >60 04/23/2020 0832    INR    Component Value Date/Time   INR 1.2 07/28/2022 1150     Intake/Output Summary (Last 24 hours) at 07/30/2022 1044 Last data filed at 07/30/2022 0700 Gross per 24 hour  Intake 570 ml  Output 1625 ml  Net -1055 ml      Assessment/Plan:  66 y.o. male is s/p endovascular repair of ruptured AAA 2 Days Post-Op   BLE well perfused Renal function stabilized; good urine output N/V this morning; hold  narcotics Ambulate patient Home if N/V resolves and tolerating a diet   Dagoberto Ligas, PA-C [facility] [phone] 07/30/2022 10:44 AM   VASCULAR STAFF ADDENDUM:  I have independently interviewed and examined the patient. I agree with the above.  Patient is a 22-year history of esophageal stricture, has vomiting episodes at home. No abdominal pain, no back pain, states he feels like food is getting stuck in his throat, which is normal. We will continue to follow Baseline O2 needs Creatinine improved.  We will see around lunch, if improved, may discharge. Will need 2-week follow-up with interval CT angio abdomen pelvis to assess the repair.  We will hold at this time due to elevated creatinine and recent contrast administration  Cassandria Santee, MD [facility] Phone Number: [phone] 07/30/2022 10:44 AM

## 2022-07-30 NOTE — Progress Notes (Signed)
Discharge instructions given to patient and verbalizes understanding. Discharged via wc.

## 2022-07-31 ENCOUNTER — Other Ambulatory Visit: Payer: Self-pay

## 2022-07-31 DIAGNOSIS — I713 Abdominal aortic aneurysm, ruptured, unspecified: Secondary | ICD-10-CM

## 2022-07-31 NOTE — ED Provider Notes (Signed)
[facility]    CSN: [id number] Arrival date & time: 07/27/22  1400      History   Chief Complaint Chief Complaint  Patient presents with   Groin Pain    HPI John [name] is a 66 y.o. male.   Presenting today with family members for severe llq abdominal pain that started several days ago but became intolerable earlier today. States he's had decreased appetite but no vomiting, bowel changes, fever, urinary sxs. He states he has never been in this much pain in the past. So far not trying anything OTC for sxs. PMHx significant for alcohol abuse, CAD, COPD on continuous home O2, smoking, HTN, HLD.     Past Medical History:  Diagnosis Date   Alcohol abuse 12/27/2010   Allergic rhinitis, cause unspecified 12/27/2010   Anal warts 04/11/2012   CAD, NATIVE VESSEL 10/04/2010   COPD (chronic obstructive pulmonary disease) (HCC)    "a touch" (09/28/2013)   History of blood transfusion 03/2012   related to nose bleed   HYPERLIPIDEMIA-MIXED 10/04/2010   HYPERTENSION, BENIGN 10/04/2010   Myocardial infarction (HCC) 09/16/2010   Pneumonia    "when I was a kid"   Stented coronary artery    Mid LAD    TOBACCO ABUSE 10/04/2010    Patient Active Problem List   Diagnosis Date Noted   AAA (abdominal aortic aneurysm, ruptured) (HCC) 07/28/2022   Ruptured abdominal aortic aneurysm (AAA) (HCC) 07/28/2022   Primary insomnia 04/09/2022   IDA (iron deficiency anemia) 04/09/2022   Abdominal aortic aneurysm (AAA) without rupture (HCC) 06/06/2021   Leg swelling 01/24/2021   Lung nodule 01/24/2021   Chronic obstructive pulmonary disease (HCC) 01/24/2021   Chronic respiratory failure with hypoxia and hypercapnia (HCC) 12/09/2019   COPD MZ/ GOLD II spirometry  but 02 dep/ hypercarbic as of 11/2019 12/26/2018   Unstable angina (HCC) 09/27/2014   Anal warts 04/11/2012   HLD (hyperlipidemia) 10/04/2010   Former smoker 10/04/2010   CAD, NATIVE VESSEL 10/04/2010    Past Surgical History:   Procedure Laterality Date   ABDOMINAL AORTIC ENDOVASCULAR STENT GRAFT N/A 07/28/2022   Procedure: ABDOMINAL AORTIC ENDOVASCULAR STENT GRAFT;  Surgeon: Victorino Sparrow, MD;  Location: [facility] OR;  Service: Vascular;  Laterality: N/A;   CARDIAC CATHETERIZATION  ~ 2012   CORONARY ANGIOPLASTY WITH STENT PLACEMENT  09/17/2010; 09/28/2014   "1; 2"   FINGER SURGERY Left    "almost cut off" tip of 2nd digit   LEFT HEART CATHETERIZATION WITH CORONARY ANGIOGRAM N/A 09/28/2014   Procedure: LEFT HEART CATHETERIZATION WITH CORONARY ANGIOGRAM;  Surgeon: Kathleene Hazel, MD;  Location: [facility] CATH LAB;  Service: Cardiovascular;  Laterality: N/A;   NASAL ENDOSCOPY WITH EPISTAXIS CONTROL Bilateral 03/2012   ULTRASOUND GUIDANCE FOR VASCULAR ACCESS N/A 07/28/2022   Procedure: ULTRASOUND GUIDANCE FOR VASCULAR ACCESS;  Surgeon: Victorino Sparrow, MD;  Location: [facility] OR;  Service: Vascular;  Laterality: N/A;   VASECTOMY         Home Medications    Prior to Admission medications   Medication Sig Start Date End Date Taking? Authorizing Provider  albuterol (PROAIR HFA) 108 (90 Base) MCG/ACT inhaler 2 puffs every 4 hours as needed only  if your can't catch your breath Patient taking differently: Inhale 2 puffs into the lungs every 4 (four) hours as needed for wheezing or shortness of breath. 2 puffs every 4 hours as needed only  if your can't catch your breath 06/18/21   Coralyn Helling, MD  albuterol (  PROVENTIL) (2.5 MG/3ML) 0.083% nebulizer solution Take 3 mLs (2.5 mg total) by nebulization every 4 (four) hours as needed for wheezing or shortness of breath. 12/18/21   Coralyn Helling, MD  ALPRAZolam Prudy Feeler) 0.25 MG tablet Take 1 tablet (0.25 mg total) by mouth 3 (three) times daily as needed for anxiety. 05/13/22   Coralyn Helling, MD  aspirin EC 81 MG tablet Take 81 mg by mouth daily.    [provider]  atorvastatin (LIPITOR) 40 MG tablet Take 1 tablet (40 mg total) by mouth daily. 12/05/21   Kathleene Hazel, MD   Budeson-Glycopyrrol-Formoterol (BREZTRI AEROSPHERE) 160-9-4.8 MCG/ACT AERO Inhale 2 puffs into the lungs in the morning and at bedtime. 05/28/22   Coralyn Helling, MD  docusate sodium (COLACE) 100 MG capsule Take 1 capsule (100 mg total) by mouth daily. 07/31/22   Emilie Rutter, PA-C  furosemide (LASIX) 40 MG tablet Take 1 tablet (40 mg total) by mouth daily. 12/05/21   Kathleene Hazel, MD  guaiFENesin (MUCINEX) 600 MG 12 hr tablet Take 2 tablets (1,200 mg total) by mouth 2 (two) times daily as needed for cough or to loosen phlegm. 01/19/21   Coralyn Helling, MD  Multiple Vitamin (MULTIVITAMIN WITH MINERALS) TABS tablet Take 1 tablet by mouth daily.    [provider]  nitroGLYCERIN (NITROSTAT) 0.4 MG SL tablet Place 1 tablet (0.4 mg total) under the tongue every 5 (five) minutes as needed for chest pain. 12/05/21   Kathleene Hazel, MD  OXYGEN Inhale 2-3 L into the lungs.    [provider]  senna (SENOKOT) 8.6 MG TABS tablet Take 1 tablet (8.6 mg total) by mouth daily as needed for mild constipation. 07/30/22   Emilie Rutter, PA-C  traZODone (DESYREL) 100 MG tablet Take 100 mg by mouth at bedtime. 06/28/22   [provider]    Family History Family History  Problem Relation Age of Onset   Brain cancer Mother    Heart disease Father     Social History Social History   Tobacco Use   Smoking status: Former    Packs/day: 2.00    Years: 50.00    Total pack years: 100.00    Types: Cigarettes    Start date: 57    Quit date: 04/18/2020    Years since quitting: 2.2   Smokeless tobacco: Never   Tobacco comments:    2 plus packs per day. He has a 100 + pack - year history of tobacco abuse currently. Former 4 ppd for 25 years.  Vaping Use   Vaping Use: Never used  Substance Use Topics   Alcohol use: Yes    Alcohol/week: 144.0 standard drinks of alcohol    Types: 144 Cans of beer per week    Comment:  history 12 pack of beer per night".  2021- drinks 3  beer/day   Drug use: No     Allergies   Penicillins   Review of Systems Review of Systems PER HPI  Physical Exam Triage Vital Signs ED Triage Vitals  Enc Vitals Group     BP 07/27/22 1520 126/82     Pulse Rate 07/27/22 1520 (!) 126     Resp 07/27/22 1520 (!) 26     Temp 07/27/22 1520 98.2 F (36.8 C)     Temp Source 07/27/22 1520 Oral     SpO2 07/27/22 1520 94 %     Weight --      Height --      Head  Circumference --      Peak Flow --      Pain Score 07/27/22 1521 6     Pain Loc --      Pain Edu? --      Excl. in GC? --    No data found.  Updated Vital Signs BP 126/82 (BP Location: Right Arm)   Pulse (!) 126   Temp 98.2 F (36.8 C) (Oral)   Resp (!) 26   SpO2 94%   Visual Acuity Right Eye Distance:   Left Eye Distance:   Bilateral Distance:    Right Eye Near:   Left Eye Near:    Bilateral Near:     Physical Exam Vitals and nursing note reviewed.  Constitutional:      General: He is in acute distress.     Appearance: He is diaphoretic.  HENT:     Head: Atraumatic.     Mouth/Throat:     Mouth: Mucous membranes are moist.  Eyes:     Extraocular Movements: Extraocular movements intact.     Conjunctiva/sclera: Conjunctivae normal.  Cardiovascular:     Rate and Rhythm: Regular rhythm. Tachycardia present.  Pulmonary:     Breath sounds: No wheezing or rales.     Comments: tachypneic Abdominal:     General: Bowel sounds are normal. There is no distension.     Palpations: There is no mass.     Tenderness: There is abdominal tenderness. There is guarding. There is no right CVA tenderness, left CVA tenderness or rebound.     Comments: Left groin/LLQ ttp with guarding, no distention or obvious hernia defect palpable  Musculoskeletal:        General: Normal range of motion.     Cervical back: Normal range of motion and neck supple.  Skin:    General: Skin is warm.     Findings: No bruising or erythema.  Neurological:     General: No focal deficit  present.     Mental Status: He is alert and oriented to person, place, and time.  Psychiatric:        Mood and Affect: Mood normal.      UC Treatments / Results  Labs (all labs ordered are listed, but only abnormal results are displayed) Labs Reviewed - No data to display  EKG   Radiology No results found.  Procedures Procedures (including critical care time)  Medications Ordered in UC Medications - No data to display  Initial Impression / Assessment and Plan / UC Course  I have reviewed the triage vital signs and the nursing notes.  Pertinent labs & imaging results that were available during my care of the patient were reviewed by me and considered in my medical decision making (see chart for details).     Tachycardic and tachypneic in triage, with increased O2 needs from baseline on nasal cannula. Patient appears in distress secondary to pain and states pain is worst of life. Given severity of sxs, recommended further evaluation in ED. Patient requesting private vehicle transport, family members to transport. Declines EMS transport and has portable O2 for car.   Final Clinical Impressions(s) / UC Diagnoses   Final diagnoses:  LLQ pain  Tachycardia  COPD, severe (HCC)   Discharge Instructions   None    ED Prescriptions   None    PDMP not reviewed this encounter.   Particia Nearing, [place] 07/31/22 2206

## 2022-08-01 LAB — TYPE AND SCREEN
ABO/RH(D): O POS
Antibody Screen: NEGATIVE
Unit division: 0
Unit division: 0
Unit division: 0
Unit division: 0

## 2022-08-01 LAB — BPAM RBC
Blood Product Expiration Date: 202311062359
Blood Product Expiration Date: 202311262359
Blood Product Expiration Date: 202311262359
Blood Product Expiration Date: 202311262359
ISSUE DATE / TIME: 202311050857
ISSUE DATE / TIME: 202311050857
ISSUE DATE / TIME: 202311050857
ISSUE DATE / TIME: 202311051427
Unit Type and Rh: 5100
Unit Type and Rh: 5100
Unit Type and Rh: 5100
Unit Type and Rh: 5100

## 2022-08-01 NOTE — Discharge Summary (Signed)
[facility] Discharge Summary   John [name] 03-02-1956 66 y.o. male  MRN: [id number]  Admission Date: 07/27/2022  Discharge Date: 07/30/22  Physician: Dr. Karin Lieu  Admission Diagnosis: Ruptured infrarenal abdominal aortic aneurysm (AAA) [facility]) [I71.33] AAA (abdominal aortic aneurysm, ruptured) (HCC) [I71.30] Ruptured abdominal aortic aneurysm (AAA) (HCC) [I71.30]  Discharge Day services:    See progress note 07/30/22  Hospital Course:  [name] is a 66 year old male who was brought to the emergency department with abdominal pain.  Work-up included CT demonstrating ruptured AAA.  He was brought emergently to the operating room and underwent endovascular repair of abdominal aortic aneurysm by Dr. Sherral Hammers on 07/28/2022.  He tolerated the procedure well and was admitted to the ICU postoperatively.  POD #1 he had a greater than half point increase in creatinine however had good urine output.  He remained on 3 L of O2 by [place] however this is his home dose.  He was ambulating well and had well-perfused feet with palpable DP pulses.  POD #2 renal function stabilized.  He did have some nausea and vomiting however he states this was related to a piece of food getting stuck.  Nausea and vomiting resolved and he was ready for discharge home.  He will follow-up in office in 2 weeks with a CTA abdomen and pelvis to see Dr. Karin Lieu.  He was prescribed senna and Colace due to constipation.  Discharge instructions were reviewed with the patient and his wife and they voiced her understanding.  He was discharged home in stable condition.   CBC    Component Value Date/Time   WBC 9.4 07/29/2022 0411   RBC 2.69 (L) 07/29/2022 0411   HGB 8.2 (L) 07/29/2022 0411   HGB 11.8 (L) 03/22/2022 0910   HCT 25.2 (L) 07/29/2022 0411   HCT 36.9 (L) 03/22/2022 0910   PLT 187 07/29/2022 0411   PLT 216 03/22/2022 0910   MCV 93.7 07/29/2022 0411   MCV 96 03/22/2022 0910   MCH 30.5 07/29/2022 0411   MCHC  32.5 07/29/2022 0411   RDW 14.6 07/29/2022 0411   RDW 12.7 03/22/2022 0910   LYMPHSABS 0.8 07/27/2022 2106   LYMPHSABS 1.5 03/22/2022 0910   MONOABS 1.6 (H) 07/27/2022 2106   EOSABS 0.0 07/27/2022 2106   EOSABS 0.1 03/22/2022 0910   BASOSABS 0.0 07/27/2022 2106   BASOSABS 0.1 03/22/2022 0910    BMET    Component Value Date/Time   NA 130 (L) 07/30/2022 0414   NA 142 03/22/2022 0910   K 4.5 07/30/2022 0414   CL 86 (L) 07/30/2022 0414   CO2 34 (H) 07/30/2022 0414   GLUCOSE 96 07/30/2022 0414   GLUCOSE 123 (H) 08/15/2006 0929   BUN 31 (H) 07/30/2022 0414   BUN 13 03/22/2022 0910   CREATININE 1.60 (H) 07/30/2022 0414   CALCIUM 8.6 (L) 07/30/2022 0414   GFRNONAA 48 (L) 07/30/2022 0414   GFRAA >60 04/23/2020 0832         Discharge Diagnosis:  Ruptured infrarenal abdominal aortic aneurysm (AAA) (HCC) [I71.33] AAA (abdominal aortic aneurysm, ruptured) (HCC) [I71.30] Ruptured abdominal aortic aneurysm (AAA) (HCC) [I71.30]  Secondary Diagnosis: Patient Active Problem List   Diagnosis Date Noted   AAA (abdominal aortic aneurysm, ruptured) (HCC) 07/28/2022   Ruptured abdominal aortic aneurysm (AAA) (HCC) 07/28/2022   Primary insomnia 04/09/2022   IDA (iron deficiency anemia) 04/09/2022   Abdominal aortic aneurysm (AAA) without rupture (HCC) 06/06/2021   Leg swelling 01/24/2021   Lung nodule 01/24/2021  Chronic obstructive pulmonary disease (HCC) 01/24/2021   Chronic respiratory failure with hypoxia and hypercapnia (HCC) 12/09/2019   COPD MZ/ GOLD II spirometry  but 02 dep/ hypercarbic as of 11/2019 12/26/2018   Unstable angina (HCC) 09/27/2014   Anal warts 04/11/2012   HLD (hyperlipidemia) 10/04/2010   Former smoker 10/04/2010   CAD, NATIVE VESSEL 10/04/2010   Past Medical History:  Diagnosis Date   Alcohol abuse 12/27/2010   Allergic rhinitis, cause unspecified 12/27/2010   Anal warts 04/11/2012   CAD, NATIVE VESSEL 10/04/2010   COPD (chronic obstructive pulmonary  disease) (HCC)    "a touch" (09/28/2013)   History of blood transfusion 03/2012   related to nose bleed   HYPERLIPIDEMIA-MIXED 10/04/2010   HYPERTENSION, BENIGN 10/04/2010   Myocardial infarction (HCC) 09/16/2010   Pneumonia    "when I was a kid"   Stented coronary artery    Mid LAD    TOBACCO ABUSE 10/04/2010     Allergies as of 07/30/2022       Reactions   Penicillins Other (See Comments)   Passes out        Medication List     TAKE these medications    albuterol 108 (90 Base) MCG/ACT inhaler Commonly known as: ProAir HFA 2 puffs every 4 hours as needed only  if your can't catch your breath What changed:  how much to take how to take this when to take this reasons to take this   albuterol (2.5 MG/3ML) 0.083% nebulizer solution Commonly known as: PROVENTIL Take 3 mLs (2.5 mg total) by nebulization every 4 (four) hours as needed for wheezing or shortness of breath. What changed: Another medication with the same name was changed. Make sure you understand how and when to take each.   ALPRAZolam 0.25 MG tablet Commonly known as: XANAX Take 1 tablet (0.25 mg total) by mouth 3 (three) times daily as needed for anxiety.   aspirin EC 81 MG tablet Take 81 mg by mouth daily.   atorvastatin 40 MG tablet Commonly known as: LIPITOR Take 1 tablet (40 mg total) by mouth daily.   Breztri Aerosphere 160-9-4.8 MCG/ACT Aero Generic drug: Budeson-Glycopyrrol-Formoterol Inhale 2 puffs into the lungs in the morning and at bedtime.   docusate sodium 100 MG capsule Commonly known as: COLACE Take 1 capsule (100 mg total) by mouth daily.   furosemide 40 MG tablet Commonly known as: LASIX Take 1 tablet (40 mg total) by mouth daily.   guaiFENesin 600 MG 12 hr tablet Commonly known as: Mucinex Take 2 tablets (1,200 mg total) by mouth 2 (two) times daily as needed for cough or to loosen phlegm.   multivitamin with minerals Tabs tablet Take 1 tablet by mouth daily.   nitroGLYCERIN  0.4 MG SL tablet Commonly known as: Nitrostat Place 1 tablet (0.4 mg total) under the tongue every 5 (five) minutes as needed for chest pain.   OXYGEN Inhale 2-3 L into the lungs.   senna 8.6 MG Tabs tablet Commonly known as: SENOKOT Take 1 tablet (8.6 mg total) by mouth daily as needed for mild constipation.   traZODone 100 MG tablet Commonly known as: DESYREL Take 100 mg by mouth at bedtime.        Discharge Instructions:   [facility]  Discharge Instructions Endovascular Aortic Aneurysm Repair  Please refer to the following instructions for your post-procedure care. Your surgeon or Physician Assistant will discuss any changes with you.  Activity  You are encouraged to  walk as much as you can. You can slowly return to normal activities but must avoid strenuous activity and heavy lifting until your doctor tells you it's OK. Avoid activities such as vacuuming or swinging a gold club. It is normal to feel tired for several weeks after your surgery. Do not drive until your doctor gives the OK and you are no longer taking prescription pain medications. It is also normal to have difficulty with sleep habits, eating, and bowel movements after surgery. These will go away with time.  Bathing/Showering  You may shower after you go home. If you have an incision, do not soak in a bathtub, hot tub, or swim until the incision heals completely.  Incision Care  Shower every day. Clean your incision with mild soap and water. Pat the area dry with a clean towel. You do not need a bandage unless otherwise instructed. Do not apply any ointments or creams to your incision. If you clothing is irritating, you may cover your incision with a dry gauze pad.  Diet  Resume your normal diet. There are no special food restrictions following this procedure. A low fat/low cholesterol diet is recommended for all patients with vascular disease. In order to heal from your  surgery, it is CRITICAL to get adequate nutrition. Your body requires vitamins, minerals, and protein. Vegetables are the best source of vitamins and minerals. Vegetables also provide the perfect balance of protein. Processed food has little nutritional value, so try to avoid this.  Medications  Resume taking all of your medications unless your doctor or Physician Assistnat tells you not to. If your incision is causing pain, you may take over-the-counter pain relievers such as acetaminophen (Tylenol). If you were prescribed a stronger pain medication, please be aware these medications can cause nausea and constipation. Prevent nausea by taking the medication with a snack or meal. Avoid constipation by drinking plenty of fluids and eating foods with a high amount of fiber, such as fruits, vegetables, and grains. Do not take Tylenol if you are taking prescription pain medications.   Follow up  Our office will schedule a follow-up appointment with a C.T. scan 3-4 weeks after your surgery.  Please call us immediately for any of the following conditions  Severe or worsening pain in your legs or feet or in your abdomen back or chest. Increased pain, redness, drainage (pus) from your incision sit. Increased abdominal pain, bloating, nausea, vomiting or persistent diarrhea. Fever of 101 degrees or higher. Swelling in your leg (s),  Reduce your risk of vascular disease  Stop smoking. If you would like help call [facility] at 1-800-QUIT-NOW (530-091-7784) or [place] at 248-073-3054. Manage your cholesterol Maintain a desired weight Control your diabetes Keep your blood pressure down  If you have questions, please call the office at [phone].    Disposition: home  Patient's condition: is Good  Follow up: 1. Dr. Karin Lieu in 2 weeks with CTA protocol   Emilie Rutter, PA-C [facility] [phone] 08/01/2022  2:06 PM   - For VQI Registry use - Post-op:   Time to Extubation: [x]  In OR, [ ]  < 12 hrs, [ ]  12-24 hrs, [ ]  >=24 hrs Vasopressors Req. Post-op: No MI: No., [ ]  Troponin only, [ ]  EKG or Clinical New Arrhythmia: No CHF: No ICU Stay: 2 days Transfusion: No     Complications: Resp failure: No., [ ]  Pneumonia, [ ]  Ventilator Chg in renal function: Yes.  , [x ] Inc. Cr >  0.5, [ ]  Temp. Dialysis,  [ ]  Permanent dialysis Leg ischemia: No., no Surgery needed, [ ]  Yes, Surgery needed,  [ ]  Amputation Bowel ischemia: No., [ ]  Medical Rx, [ ]  Surgical Rx Wound complication: No., [ ]  Superficial separation/infection, [ ]  Return to OR Return to OR: No  Return to OR for bleeding: No Stroke: No., [ ]  Minor, [ ]  Major  Discharge medications: Statin use:  Yes  ASA use:  Yes  Plavix use:  No  Beta blocker use:  No  ARB use:  No ACEI use:  No CCB use:  No

## 2022-08-07 ENCOUNTER — Encounter: Payer: Self-pay | Admitting: Pulmonary Disease

## 2022-08-07 ENCOUNTER — Telehealth: Payer: Self-pay | Admitting: Pulmonary Disease

## 2022-08-07 ENCOUNTER — Ambulatory Visit: Payer: Medicare Other | Admitting: Pulmonary Disease

## 2022-08-07 VITALS — BP 132/82 | HR 116 | Temp 98.2°F | Wt 154.8 lb

## 2022-08-07 DIAGNOSIS — J432 Centrilobular emphysema: Secondary | ICD-10-CM | POA: Diagnosis not present

## 2022-08-07 DIAGNOSIS — J9612 Chronic respiratory failure with hypercapnia: Secondary | ICD-10-CM | POA: Diagnosis not present

## 2022-08-07 DIAGNOSIS — J9611 Chronic respiratory failure with hypoxia: Secondary | ICD-10-CM | POA: Diagnosis not present

## 2022-08-07 NOTE — Telephone Encounter (Signed)
Debbie states patient had reaction to flu shot this year vomiting and SOB and had chills for about 5 hours after shot and states he was not able to get covid shot at pharmacy per pharmacist since he had a reaction to flu shot. They were suggesting a doctors approval for covid vax. Debbie wants to know if Dr. Craige Cotta suggests patient get covid vax and if no then wants suggestions on protecting patient from covid.  Dr. Craige Cotta please advise.

## 2022-08-07 NOTE — Patient Instructions (Signed)
Follow up in 2 to 3 months

## 2022-08-07 NOTE — Progress Notes (Signed)
[facility]  Chief Complaint  Patient presents with   Follow-up    Recent hospital stay for AAA   Past Surgical History:  He  has a past surgical history that includes Finger surgery (Left); Vasectomy; Coronary angioplasty with stent (09/17/2010; 09/28/2014); Cardiac catheterization (~ 2012); Nasal endoscopy with epistaxis control (Bilateral, 03/2012); left heart catheterization with coronary angiogram (N/A, 09/28/2014); Abdominal aortic endovascular stent graft (N/A, 07/28/2022); and Ultrasound guidance for vascular access (N/A, 07/28/2022).  Past Medical History:  PNA, HTN, HLD, CAD, Seizure, ETOH, infrarenal abdominal aortic aneurysm  Constitutional:  BP 132/82   Pulse (!) 116   Temp 98.2 F (36.8 C)   Wt 154 lb 12.8 oz (70.2 kg)   SpO2 94% Comment: ra  BMI 22.53 kg/m   Brief Summary:  John [name] is a 66 y.o. 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      Subjective:   He is here with his wife.  He was admitted to [facility] for rupture of infrarenal AAA.  He had endovascular repair.  Weight down 7 lbs since visit in September.  His breathing is about the same.  Used 3 to 4 liters oxygen.  He had a nose bleed last week.     Physical Exam:   Appearance - well kempt   ENMT - no sinus tenderness, no oral exudate, no LAN, Mallampati 2 airway, no stridor, crusted blood right nasal septum  Respiratory - decreased breath sounds bilaterally, no wheezing or rales  CV - s1s2 regular rate and rhythm, no murmurs  Ext - no clubbing, no edema  Skin - no rashes  Psych - normal mood and affect        Pulmonary testing:  PFT 12/09/19 >> FEV1 .059 (16%), FEV1% 37, DLCO 27% A1AT 03/09/20 >> 110, MZ ABG 02/12/21 >> pH 7.28, PCO2 80.9, PO2 109 on 40% FiO2 PFT 04/11/21 >> FEV1 0.6 (18%), FEV1% 26, DLCO 21%  Chest Imaging:  CT angio chest 11/23/19  >> moderate centrilobular emphysema, mild infrahilar BTX, 6 mm nodule LUL CT chest 01/02/21 >> no change LUL nodule, new 1.1 cm LLL opacity CT chest 03/28/21 >> severe emphysema, 2.2 x 0.8 cm band like opacity RLL from ATX, mucus plugging, 3 mm nodule RUL, 1.6 x 0.7 cm nodularity LUL CT chest 04/09/21 >> diffuse centrilobular emphysema, stable nodule LUL, 8 mm nodule RLL CT chest 03/27/22 >> stable GGO LLL  Cardiac Tests:  Echo 04/19/20 >> EF 65 to 70%  Social History:  He  reports that he quit smoking about 2 years ago. His smoking use included cigarettes. He started smoking about 53 years ago. He has a 100.00 pack-year smoking history. He has never used smokeless tobacco. He reports current alcohol use of about 144.0 standard drinks of alcohol per week. He reports that he does not use drugs.  Family History:  His family history includes Brain cancer in his mother; Heart disease in his father.     Assessment/Plan:   Severe COPD from emphysema. - he had assessment at [facility] in July 2022 and informed he wasn't a candidate for lung transplant due to coronary artery disease and infrarenal abdominal aortic aneurysm - continue breztri with spacer - prn albuterol, mucinex  MZ alpha 1 antitrypsin heterozygote. - his level is sufficient that he does require augmentation therapy  Chronic respiratory failure with hypoxia and hypercapnia. - he uses [employer] for his DME -  goal SpO2 > 90% - 3 to 4 liters 24/7 - NIV at night and prn during the day  Lung nodule. - he would like to continue radiographic monitoring - he will need follow up CT chest without contrast in July 2024  Chronic rhinitis with epistaxis. - prn flonase - discussed trimming his nasal cannula prong to prevent irritation of his nasal septum - previously seen by Dr. Suszanne Conners with ENT  Insomnia, anxiety. - prn xanax  Coronary artery disease. - he is followed by Dr. Earney Hamburg with [facility]  Infrarenal abdominal aortic  aneurysm. - s/p endovascular repair - follow up with Dr. Gerarda Fraction with vascular surgery  Goals of care. - he was clear he would not want intubation or cardiac resuscitation in the event of cardiac arrest - he is okay to follow up CT imaging, and continue NIMV and supplemental oxygen - he is concerned about dying in the hospital - followed by palliative care  Time Spent Involved in Patient Care on Day of Examination:  26 minutes  Follow up:   Patient Instructions  Follow up in 2 to 3 months  Medication List:   Allergies as of 08/07/2022       Reactions   Penicillins Other (See Comments)   Passes out        Medication List        Accurate as of August 07, 2022 11:26 AM. If you have any questions, ask your nurse or doctor.          albuterol 108 (90 Base) MCG/ACT inhaler Commonly known as: ProAir HFA 2 puffs every 4 hours as needed only  if your can't catch your breath What changed:  how much to take how to take this when to take this reasons to take this   albuterol (2.5 MG/3ML) 0.083% nebulizer solution Commonly known as: PROVENTIL Take 3 mLs (2.5 mg total) by nebulization every 4 (four) hours as needed for wheezing or shortness of breath. What changed: Another medication with the same name was changed. Make sure you understand how and when to take each.   ALPRAZolam 0.25 MG tablet Commonly known as: XANAX Take 1 tablet (0.25 mg total) by mouth 3 (three) times daily as needed for anxiety.   aspirin EC 81 MG tablet Take 81 mg by mouth daily.   atorvastatin 40 MG tablet Commonly known as: LIPITOR Take 1 tablet (40 mg total) by mouth daily.   Breztri Aerosphere 160-9-4.8 MCG/ACT Aero Generic drug: Budeson-Glycopyrrol-Formoterol Inhale 2 puffs into the lungs in the morning and at bedtime.   docusate sodium 100 MG capsule Commonly known as: COLACE Take 1 capsule (100 mg total) by mouth daily.   furosemide 40 MG tablet Commonly known as:  LASIX Take 1 tablet (40 mg total) by mouth daily.   guaiFENesin 600 MG 12 hr tablet Commonly known as: Mucinex Take 2 tablets (1,200 mg total) by mouth 2 (two) times daily as needed for cough or to loosen phlegm.   multivitamin with minerals Tabs tablet Take 1 tablet by mouth daily.   nitroGLYCERIN 0.4 MG SL tablet Commonly known as: Nitrostat Place 1 tablet (0.4 mg total) under the tongue every 5 (five) minutes as needed for chest pain.   OXYGEN Inhale 2-3 L into the lungs.   senna 8.6 MG Tabs tablet Commonly known as: SENOKOT Take 1 tablet (8.6 mg total) by mouth daily as needed for mild constipation.   traZODone 100 MG tablet Commonly known as: DESYREL Take  100 mg by mouth at bedtime.        Signature:  Coralyn Helling, MD [facility] Pager - [phone] 08/07/2022, 11:26 AM

## 2022-08-07 NOTE — Telephone Encounter (Signed)
Called and notified patient of response from Dr. Craige Cotta and he voiced understanding. Nothing further needed at this time.

## 2022-08-07 NOTE — Telephone Encounter (Signed)
He had COVID vaccines before and didn't have significant reaction.  Just because he had a reaction to the flu vaccine last year doesn't mean he would have a reaction to every other vaccine.  I think it should be okay for him to get the most recent COVID booster.

## 2022-08-08 ENCOUNTER — Ambulatory Visit (INDEPENDENT_AMBULATORY_CARE_PROVIDER_SITE_OTHER): Payer: Medicare Other

## 2022-08-08 DIAGNOSIS — Z0000 Encounter for general adult medical examination without abnormal findings: Secondary | ICD-10-CM

## 2022-08-08 NOTE — Progress Notes (Signed)
Subjective:   John [name] is a 66 y.o. male who presents for Medicare Annual/Subsequent preventive examination. I connected with  John [name] on 08/08/22 by a audio enabled telemedicine application and verified that I am speaking with the correct person using two identifiers.  Patient Location: Home  Provider Location: Office/Clinic  I discussed the limitations of evaluation and management by telemedicine. The patient expressed understanding and agreed to proceed.  Review of Systems           Objective:    There were no vitals filed for this visit. There is no height or weight on file to calculate BMI.     07/27/2022    9:01 PM 06/02/2021    9:40 AM 02/28/2021    1:11 PM 02/12/2021    7:04 PM 12/20/2020    9:33 PM 04/18/2020   10:58 PM 11/23/2019    5:44 PM  Advanced Directives  Does Patient Have a Medical Advance Directive? No Yes No No No Yes No  Type of Advance Directive  Healthcare Power of [employer] Power of Attorney   Does patient want to make changes to medical advance directive?      No - Patient declined   Copy of Healthcare Power of Attorney in Chart?  Yes - validated most recent copy scanned in chart (See row information)    No - copy requested   Would patient like information on creating a medical advance directive?   Yes (MAU/Ambulatory/Procedural Areas - Information given)  No - Patient declined  No - Patient declined    Current Medications (verified) Outpatient Encounter Medications as of 08/08/2022  Medication Sig   albuterol (PROAIR HFA) 108 (90 Base) MCG/ACT inhaler 2 puffs every 4 hours as needed only  if your can't catch your breath (Patient taking differently: Inhale 2 puffs into the lungs every 4 (four) hours as needed for wheezing or shortness of breath. 2 puffs every 4 hours as needed only  if your can't catch your breath)   albuterol (PROVENTIL) (2.5 MG/3ML) 0.083% nebulizer solution Take 3 mLs (2.5 mg total) by nebulization every  4 (four) hours as needed for wheezing or shortness of breath.   ALPRAZolam (XANAX) 0.25 MG tablet Take 1 tablet (0.25 mg total) by mouth 3 (three) times daily as needed for anxiety.   aspirin EC 81 MG tablet Take 81 mg by mouth daily.   atorvastatin (LIPITOR) 40 MG tablet Take 1 tablet (40 mg total) by mouth daily.   Budeson-Glycopyrrol-Formoterol (BREZTRI AEROSPHERE) 160-9-4.8 MCG/ACT AERO Inhale 2 puffs into the lungs in the morning and at bedtime.   docusate sodium (COLACE) 100 MG capsule Take 1 capsule (100 mg total) by mouth daily.   furosemide (LASIX) 40 MG tablet Take 1 tablet (40 mg total) by mouth daily.   guaiFENesin (MUCINEX) 600 MG 12 hr tablet Take 2 tablets (1,200 mg total) by mouth 2 (two) times daily as needed for cough or to loosen phlegm.   Multiple Vitamin (MULTIVITAMIN WITH MINERALS) TABS tablet Take 1 tablet by mouth daily.   nitroGLYCERIN (NITROSTAT) 0.4 MG SL tablet Place 1 tablet (0.4 mg total) under the tongue every 5 (five) minutes as needed for chest pain.   OXYGEN Inhale 2-3 L into the lungs.   senna (SENOKOT) 8.6 MG TABS tablet Take 1 tablet (8.6 mg total) by mouth daily as needed for mild constipation.   traZODone (DESYREL) 100 MG tablet Take 100 mg by mouth at bedtime.  No facility-administered encounter medications on file as of 08/08/2022.    Allergies (verified) Penicillins   History: Past Medical History:  Diagnosis Date   Alcohol abuse 12/27/2010   Allergic rhinitis, cause unspecified 12/27/2010   Anal warts 04/11/2012   CAD, NATIVE VESSEL 10/04/2010   COPD (chronic obstructive pulmonary disease) (HCC)    "a touch" (09/28/2013)   History of blood transfusion 03/2012   related to nose bleed   HYPERLIPIDEMIA-MIXED 10/04/2010   HYPERTENSION, BENIGN 10/04/2010   Myocardial infarction (HCC) 09/16/2010   Pneumonia    "when I was a kid"   Stented coronary artery    Mid LAD    TOBACCO ABUSE 10/04/2010   Past Surgical History:  Procedure Laterality Date    ABDOMINAL AORTIC ENDOVASCULAR STENT GRAFT N/A 07/28/2022   Procedure: ABDOMINAL AORTIC ENDOVASCULAR STENT GRAFT;  Surgeon: Victorino Sparrow, MD;  Location: [facility] OR;  Service: Vascular;  Laterality: N/A;   CARDIAC CATHETERIZATION  ~ 2012   CORONARY ANGIOPLASTY WITH STENT PLACEMENT  09/17/2010; 09/28/2014   "1; 2"   FINGER SURGERY Left    "almost cut off" tip of 2nd digit   LEFT HEART CATHETERIZATION WITH CORONARY ANGIOGRAM N/A 09/28/2014   Procedure: LEFT HEART CATHETERIZATION WITH CORONARY ANGIOGRAM;  Surgeon: Kathleene Hazel, MD;  Location: [facility] CATH LAB;  Service: Cardiovascular;  Laterality: N/A;   NASAL ENDOSCOPY WITH EPISTAXIS CONTROL Bilateral 03/2012   ULTRASOUND GUIDANCE FOR VASCULAR ACCESS N/A 07/28/2022   Procedure: ULTRASOUND GUIDANCE FOR VASCULAR ACCESS;  Surgeon: Victorino Sparrow, MD;  Location: [facility] OR;  Service: Vascular;  Laterality: N/A;   VASECTOMY     Family History  Problem Relation Age of Onset   Brain cancer Mother    Heart disease Father    Social History   Socioeconomic History   Marital status: Single    Spouse name: Not on file   Number of children: Not on file   Years of education: Not on file   Highest education level: Not on file  Occupational History   Occupation: Lobbyist maintenance work     Employer: [employer]    Comment: Ronette Deter  Tobacco Use   Smoking status: Former    Packs/day: 2.00    Years: 50.00    Total pack years: 100.00    Types: Cigarettes    Start date: 1970    Quit date: 04/18/2020    Years since quitting: 2.3   Smokeless tobacco: Never   Tobacco comments:    2 plus packs per day. He has a 100 + pack - year history of tobacco abuse currently. Former 4 ppd for 25 years.  Vaping Use   Vaping Use: Never used  Substance and Sexual Activity   Alcohol use: Yes    Alcohol/week: 144.0 standard drinks of alcohol    Types: 144 Cans of beer per week    Comment:  history 12 pack of beer per night".  2021- drinks 3 beer/day   Drug use:  No   Sexual activity: Yes    Birth control/protection: None  Other Topics Concern   Not on file  Social History Narrative   The patient lives in [place] with his girlfriend. 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   Social Determinants of Health   Financial Resource Strain: Low Risk  (06/02/2021)   Overall Financial Resource Strain (CARDIA)    Difficulty of Paying Living Expenses: Not hard at all  Food Insecurity: No  Food Insecurity (06/02/2021)   Hunger Vital Sign    Worried About Running Out of Food in the Last Year: Never true    Ran Out of Food in the Last Year: Never true  Transportation Needs: No Transportation Needs (06/02/2021)   PRAPARE - Administrator, Civil Service (Medical): No    Lack of Transportation (Non-Medical): No  Physical Activity: Inactive (06/02/2021)   Exercise Vital Sign    Days of Exercise per Week: 0 days    Minutes of Exercise per Session: 0 min  Stress: No Stress Concern Present (06/02/2021)   [employer] of Occupational Health - Occupational Stress Questionnaire    Feeling of Stress : Not at all  Social Connections: Socially Isolated (06/02/2021)   Social Connection and Isolation Panel [NHANES]    Frequency of Communication with Friends and Family: More than three times a week    Frequency of Social Gatherings with Friends and Family: More than three times a week    Attends Religious Services: Never    Database administrator or Organizations: No    Attends Banker Meetings: Never    Marital Status: Divorced    Tobacco Counseling Counseling given: Not Answered Tobacco comments: 2 plus packs per day. He has a 100 + pack - year history of tobacco abuse currently. Former 4 ppd for 25 years.   Clinical Intake:                 Diabetic?no         Activities of Daily Living     No data to display          Patient Care Team: Anabel Halon, MD as PCP - General (Internal  Medicine) Kathleene Hazel, MD as PCP - Cardiology (Cardiology) Jena Gauss Gerrit Friends, MD as Consulting Physician (Gastroenterology)  Indicate any recent Medical Services you may have received from other than [facility] providers in the past year (date may be approximate).     Assessment:   This is a routine wellness examination for [place].  Hearing/Vision screen No results found.  Dietary issues and exercise activities discussed:     Goals Addressed   None    Depression Screen    04/09/2022    1:05 PM 10/10/2021    1:05 PM 06/06/2021    1:00 PM 06/02/2021    9:41 AM 06/02/2021    9:38 AM 01/24/2021    1:10 PM  PHQ 2/9 Scores  PHQ - 2 Score 0 0 0 0 0 0    Fall Risk    04/09/2022    1:05 PM 10/10/2021    1:05 PM 06/06/2021    1:00 PM 06/02/2021    9:40 AM 01/24/2021    1:10 PM  Fall Risk   Falls in the past year? 0 0 0 0 0  Number falls in past yr: 0 0 0 0 0  Injury with Fall? 0 0 0 0 0  Risk for fall due to : No Fall Risks No Fall Risks No Fall Risks No Fall Risks No Fall Risks  Follow up Falls evaluation completed Falls evaluation completed Falls evaluation completed Falls evaluation completed Falls evaluation completed    FALL RISK PREVENTION PERTAINING TO THE HOME:  Any stairs in or around the home? Yes  If so, are there any without handrails? No  Home free of loose throw rugs in walkways, pet beds, electrical cords, etc? Yes  Adequate lighting in your home to reduce  risk of falls? Yes   ASSISTIVE DEVICES UTILIZED TO PREVENT FALLS:  Life alert? No  Use of a cane, walker or w/c? No  Grab bars in the bathroom? No  Shower chair or bench in shower? Yes  Elevated toilet seat or a handicapped toilet? Yes   TIMED UP AND GO:  Was the test performed? No .  Length of time to ambulate 10 feet:  sec.     Cognitive Function:    06/02/2021    9:42 AM  MMSE - Mini Mental State Exam  Not completed: Unable to complete        06/02/2021    9:42 AM  6CIT Screen  What  Year? 0 points  What month? 0 points  What time? 0 points  Count back from 20 0 points  Months in reverse 0 points  Repeat phrase 0 points  Total Score 0 points    Immunizations Immunization History  Administered Date(s) Administered   Hep A / Hep B 04/10/2021   Hepatitis B, adult 04/10/2021   Influenza,inj,Quad PF,6+ Mos 07/05/2020, 07/31/2021   Influenza-Unspecified 07/05/2020   Moderna Sars-Covid-2 Vaccination 12/15/2019, 01/12/2020, 08/15/2020   Pneumococcal Conjugate-13 10/10/2021   Pneumococcal Polysaccharide-23 10/11/2020    TDAP status: Due, Education has been provided regarding the importance of this vaccine. Advised may receive this vaccine at local pharmacy or Health Dept. Aware to provide a copy of the vaccination record if obtained from local pharmacy or Health Dept. Verbalized acceptance and understanding.  Flu Vaccine status: Due, Education has been provided regarding the importance of this vaccine. Advised may receive this vaccine at local pharmacy or Health Dept. Aware to provide a copy of the vaccination record if obtained from local pharmacy or Health Dept. Verbalized acceptance and understanding.    Covid-19 vaccine status: Information provided on how to obtain vaccines.   Qualifies for Shingles Vaccine? Yes   Zostavax completed No   Shingrix Completed?: No.    Education has been provided regarding the importance of this vaccine. Patient has been advised to call insurance company to determine out of pocket expense if they have not yet received this vaccine. Advised may also receive vaccine at local pharmacy or Health Dept. Verbalized acceptance and understanding.  Screening Tests Health Maintenance  Topic Date Due   Hepatitis C Screening  Never done   Zoster Vaccines- Shingrix (1 of 2) Never done   TETANUS/TDAP  09/23/2013   COVID-19 Vaccine (4 - Moderna risk series) 10/10/2020   INFLUENZA VACCINE  04/23/2022   Medicare Annual Wellness (AWV)  06/02/2022    COLONOSCOPY (Pts 45-19yrs Insurance coverage will need to be confirmed)  09/12/2022 (Originally 05/01/2022)   Lung Cancer Screening  03/28/2023   Fecal DNA (Cologuard)  04/22/2025   HIV Screening  Completed   HPV VACCINES  Aged Out    Health Maintenance  Health Maintenance Due  Topic Date Due   Hepatitis C Screening  Never done   Zoster Vaccines- Shingrix (1 of 2) Never done   TETANUS/TDAP  09/23/2013   COVID-19 Vaccine (4 - Moderna risk series) 10/10/2020   INFLUENZA VACCINE  04/23/2022   Medicare Annual Wellness (AWV)  06/02/2022      Lung Cancer Screening: (Low Dose CT Chest recommended if Age 74-80 years, 30 pack-year currently smoking OR have quit w/in 15years.) does not qualify.   Lung Cancer Screening Referral:   Additional Screening:  Hepatitis C Screening: does qualify; Completed   Vision Screening: Recommended annual ophthalmology exams for early detection  of glaucoma and other disorders of the eye. Is the patient up to date with their annual eye exam?  Yes  Who is the provider or what is the name of the office in which the patient attends annual eye exams? MY eye doctor If pt is not established with a provider, would they like to be referred to a provider to establish care? No .   Dental Screening: Recommended annual dental exams for proper oral hygiene  Community Resource Referral / Chronic Care Management: CRR required this visit?  No   CCM required this visit?  No      Plan:     I have personally reviewed and noted the following in the patient's chart:   Medical and social history Use of alcohol, tobacco or illicit drugs  Current medications and supplements including opioid prescriptions. Patient is not currently taking opioid prescriptions. Functional ability and status Nutritional status Physical activity Advanced directives List of other physicians Hospitalizations, surgeries, and ER visits in previous 12 months Vitals Screenings to include  cognitive, depression, and falls Referrals and appointments  In addition, I have reviewed and discussed with patient certain preventive protocols, quality metrics, and best practice recommendations. A written personalized care plan for preventive services as well as general preventive health recommendations were provided to patient.     Harriet Pho, CMA   08/08/2022   Nurse Notes:

## 2022-08-08 NOTE — Patient Instructions (Signed)
  John [name] , Thank you for taking time to come for your Medicare Wellness Visit. I appreciate your ongoing commitment to your health goals. Please review the following plan we discussed and let me know if I can assist you in the future.   These are the goals we discussed:  Goals      Patient Stated     Patient would like to just live past the end of they year      Patient Stated     Patient states that his goal is to stay alive.        This is a list of the screening recommended for you and due dates:  Health Maintenance  Topic Date Due   Hepatitis C Screening: USPSTF Recommendation to screen - Ages 49-79 yo.  Never done   Zoster (Shingles) Vaccine (1 of 2) Never done   Tetanus Vaccine  09/23/2013   COVID-19 Vaccine (4 - Moderna risk series) 10/10/2020   Flu Shot  04/23/2022   Colon Cancer Screening  09/12/2022*   Screening for Lung Cancer  03/28/2023   Medicare Annual Wellness Visit  08/09/2023   Cologuard (Stool DNA test)  04/22/2025   HIV Screening  Completed   HPV Vaccine  Aged Out  *Topic was postponed. The date shown is not the original due date.

## 2022-08-19 ENCOUNTER — Ambulatory Visit (HOSPITAL_COMMUNITY)
Admission: RE | Admit: 2022-08-19 | Discharge: 2022-08-19 | Disposition: A | Discharge status: Home / Self Care | Payer: Medicare Other | Source: Ambulatory Visit | Attending: Vascular Surgery | Admitting: Vascular Surgery

## 2022-08-19 DIAGNOSIS — J449 Chronic obstructive pulmonary disease, unspecified: Secondary | ICD-10-CM | POA: Diagnosis not present

## 2022-08-19 DIAGNOSIS — J9610 Chronic respiratory failure, unspecified whether with hypoxia or hypercapnia: Secondary | ICD-10-CM | POA: Diagnosis not present

## 2022-08-19 DIAGNOSIS — N1330 Unspecified hydronephrosis: Secondary | ICD-10-CM | POA: Diagnosis not present

## 2022-08-19 DIAGNOSIS — I713 Abdominal aortic aneurysm, ruptured, unspecified: Secondary | ICD-10-CM | POA: Diagnosis not present

## 2022-08-19 MED ORDER — IOHEXOL 350 MG/ML SOLN
75.0000 mL | Freq: Once | INTRAVENOUS | Status: AC | PRN
  Administered 2022-08-19: 75 mL via INTRAVENOUS

## 2022-08-23 ENCOUNTER — Encounter: Payer: Self-pay | Admitting: Vascular Surgery

## 2022-08-23 ENCOUNTER — Ambulatory Visit (INDEPENDENT_AMBULATORY_CARE_PROVIDER_SITE_OTHER): Payer: Medicare Other | Admitting: Vascular Surgery

## 2022-08-23 VITALS — BP 97/67 | HR 94 | Temp 97.9°F | Resp 20 | Ht 69.5 in | Wt 154.0 lb

## 2022-08-23 DIAGNOSIS — Z9889 Other specified postprocedural states: Secondary | ICD-10-CM

## 2022-08-23 DIAGNOSIS — Z8679 Personal history of other diseases of the circulatory system: Secondary | ICD-10-CM

## 2022-08-25 DIAGNOSIS — J9611 Chronic respiratory failure with hypoxia: Secondary | ICD-10-CM | POA: Diagnosis not present

## 2022-08-26 NOTE — Progress Notes (Signed)
Office Note    HPI: John [name] is a 66 y.o. ([birth year]) male presenting status post EVAR for rupture AAA.  On exam today, John [name] was doing well, accompanied by his wife.  He requires 3 to 4 L of oxygen at baseline, and was riding his electric scooter.  Overall he feels well.  He denies abdominal pain.  He continues to have mild left groin pain to palpation, however has not appreciated any swelling or abnormal pulsation.  Denies bilateral lower extremity claudication. Normal bowel movements, normal urination   Past Medical History:  Diagnosis Date   Alcohol abuse 12/27/2010   Allergic rhinitis, cause unspecified 12/27/2010   Anal warts 04/11/2012   CAD, NATIVE VESSEL 10/04/2010   COPD (chronic obstructive pulmonary disease) (HCC)    "a touch" (09/28/2013)   History of blood transfusion 03/2012   related to nose bleed   HYPERLIPIDEMIA-MIXED 10/04/2010   HYPERTENSION, BENIGN 10/04/2010   Myocardial infarction (HCC) 09/16/2010   Pneumonia    "when I was a kid"   Stented coronary artery    Mid LAD    TOBACCO ABUSE 10/04/2010    Past Surgical History:  Procedure Laterality Date   ABDOMINAL AORTIC ENDOVASCULAR STENT GRAFT N/A 07/28/2022   Procedure: ABDOMINAL AORTIC ENDOVASCULAR STENT GRAFT;  Surgeon: Victorino Sparrow, MD;  Location: [facility] OR;  Service: Vascular;  Laterality: N/A;   CARDIAC CATHETERIZATION  ~ 2012   CORONARY ANGIOPLASTY WITH STENT PLACEMENT  09/17/2010; 09/28/2014   "1; 2"   FINGER SURGERY Left    "almost cut off" tip of 2nd digit   LEFT HEART CATHETERIZATION WITH CORONARY ANGIOGRAM N/A 09/28/2014   Procedure: LEFT HEART CATHETERIZATION WITH CORONARY ANGIOGRAM;  Surgeon: Kathleene Hazel, MD;  Location: [facility] CATH LAB;  Service: Cardiovascular;  Laterality: N/A;   NASAL ENDOSCOPY WITH EPISTAXIS CONTROL Bilateral 03/2012   ULTRASOUND GUIDANCE FOR VASCULAR ACCESS N/A 07/28/2022   Procedure: ULTRASOUND GUIDANCE FOR VASCULAR ACCESS;  Surgeon: Victorino Sparrow, MD;  Location:  [facility] OR;  Service: Vascular;  Laterality: N/A;   VASECTOMY      Social History   Socioeconomic History   Marital status: Single    Spouse name: Not on file   Number of children: Not on file   Years of education: Not on file   Highest education level: Not on file  Occupational History   Occupation: Lobbyist maintenance work     Employer: [employer]    Comment: Ronette Deter  Tobacco Use   Smoking status: Former    Packs/day: 2.00    Years: 50.00    Total pack years: 100.00    Types: Cigarettes    Start date: 1970    Quit date: 04/18/2020    Years since quitting: 2.3   Smokeless tobacco: Never   Tobacco comments:    2 plus packs per day. He has a 100 + pack - year history of tobacco abuse currently. Former 4 ppd for 25 years.  Vaping Use   Vaping Use: Never used  Substance and Sexual Activity   Alcohol use: Yes    Alcohol/week: 144.0 standard drinks of alcohol    Types: 144 Cans of beer per week    Comment:  history 12 pack of beer per night".  2021- drinks 3 beer/day   Drug use: No   Sexual activity: Yes    Birth control/protection: None  Other Topics Concern   Not on file  Social History Narrative   The patient lives in [place] with  his girlfriend. 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   Social Determinants of Health   Financial Resource Strain: Low Risk  (08/08/2022)   Overall Financial Resource Strain (CARDIA)    Difficulty of Paying Living Expenses: Not hard at all  Food Insecurity: No Food Insecurity (08/08/2022)   Hunger Vital Sign    Worried About Running Out of Food in the Last Year: Never true    Ran Out of Food in the Last Year: Never true  Transportation Needs: No Transportation Needs (08/08/2022)   PRAPARE - Administrator, Civil Service (Medical): No    Lack of Transportation (Non-Medical): No  Physical Activity: Inactive (06/02/2021)   Exercise Vital Sign    Days of Exercise per Week: 0 days    Minutes of  Exercise per Session: 0 min  Stress: No Stress Concern Present (08/08/2022)   [employer] of Occupational Health - Occupational Stress Questionnaire    Feeling of Stress : Not at all  Social Connections: Moderately Integrated (08/08/2022)   Social Connection and Isolation Panel [NHANES]    Frequency of Communication with Friends and Family: More than three times a week    Frequency of Social Gatherings with Friends and Family: Three times a week    Attends Religious Services: Never    Active Member of Clubs or Organizations: Yes    Attends Banker Meetings: Never    Marital Status: Living with partner  Intimate Partner Violence: Not At Risk (06/02/2021)   Humiliation, Afraid, Rape, and Kick questionnaire    Fear of Current or Ex-Partner: No    Emotionally Abused: No    Physically Abused: No    Sexually Abused: No   Family History  Problem Relation Age of Onset   Brain cancer Mother    Heart disease Father     Current Outpatient Medications  Medication Sig Dispense Refill   albuterol (PROAIR HFA) 108 (90 Base) MCG/ACT inhaler 2 puffs every 4 hours as needed only  if your can't catch your breath (Patient taking differently: Inhale 2 puffs into the lungs every 4 (four) hours as needed for wheezing or shortness of breath. 2 puffs every 4 hours as needed only  if your can't catch your breath) 18 g 11   albuterol (PROVENTIL) (2.5 MG/3ML) 0.083% nebulizer solution Take 3 mLs (2.5 mg total) by nebulization every 4 (four) hours as needed for wheezing or shortness of breath. 360 mL 5   ALPRAZolam (XANAX) 0.25 MG tablet Take 1 tablet (0.25 mg total) by mouth 3 (three) times daily as needed for anxiety. 60 tablet 2   aspirin EC 81 MG tablet Take 81 mg by mouth daily.     atorvastatin (LIPITOR) 40 MG tablet Take 1 tablet (40 mg total) by mouth daily. 90 tablet 3   Budeson-Glycopyrrol-Formoterol (BREZTRI AEROSPHERE) 160-9-4.8 MCG/ACT AERO Inhale 2 puffs into the lungs in the  morning and at bedtime. 10.7 g 11   furosemide (LASIX) 40 MG tablet Take 1 tablet (40 mg total) by mouth daily. 90 tablet 3   guaiFENesin (MUCINEX) 600 MG 12 hr tablet Take 2 tablets (1,200 mg total) by mouth 2 (two) times daily as needed for cough or to loosen phlegm.     Multiple Vitamin (MULTIVITAMIN WITH MINERALS) TABS tablet Take 1 tablet by mouth daily.     nitroGLYCERIN (NITROSTAT) 0.4 MG SL tablet Place 1 tablet (0.4 mg total) under the tongue every 5 (five)  minutes as needed for chest pain. 25 tablet 6   OXYGEN Inhale 2-3 L into the lungs.     traZODone (DESYREL) 100 MG tablet Take 100 mg by mouth at bedtime.     No current facility-administered medications for this visit.    Allergies  Allergen Reactions   Penicillins Other (See Comments)    Passes out     REVIEW OF SYSTEMS:  [X]  denotes positive finding, [ ]  denotes negative finding Cardiac  Comments:  Chest pain or chest pressure:    Shortness of breath upon exertion:    Short of breath when lying flat:    Irregular heart rhythm:        Vascular    Pain in calf, thigh, or hip brought on by ambulation:    Pain in feet at night that wakes you up from your sleep:     Blood clot in your veins:    Leg swelling:         Pulmonary    Oxygen at home:    Productive cough:     Wheezing:         Neurologic    Sudden weakness in arms or legs:     Sudden numbness in arms or legs:     Sudden onset of difficulty speaking or slurred speech:    Temporary loss of vision in one eye:     Problems with dizziness:         Gastrointestinal    Blood in stool:     Vomited blood:         Genitourinary    Burning when urinating:     Blood in urine:        Psychiatric    Major depression:         Hematologic    Bleeding problems:    Problems with blood clotting too easily:        Skin    Rashes or ulcers:        Constitutional    Fever or chills:      PHYSICAL EXAMINATION:  Vitals:   08/23/22 1153  BP: 97/67   Pulse: 94  Resp: 20  Temp: 97.9 F (36.6 C)  SpO2: 95%  Weight: 154 lb (69.9 kg)  Height: 5' 9.5" (1.765 m)    General:  WDWN in NAD; vital signs documented above Gait: Not observed HENT: WNL, normocephalic Pulmonary: O2 dependent, nonlabored Cardiac: regular HR Abdomen: soft, NT, no masses Skin: without rashes Vascular Exam/Pulses:  Right Left  Radial 2+ (normal) 2+ (normal)  Ulnar    Femoral    Popliteal    DP 2+ (normal) 2+ (normal)  PT     Extremities: without ischemic changes, without Gangrene , without cellulitis; without open wounds;  Musculoskeletal: no muscle wasting or atrophy  Neurologic: A&O X 3;  No focal weakness or paresthesias are detected Psychiatric:  The pt has Normal affect.   Non-Invasive Vascular Imaging:   Ct reviewed see below    ASSESSMENT/PLAN: John [name] is a 66 y.o. male presenting status post endovascular aortic repair for ruptured AAA on 07/28/22.  Inpatient follow-up CT was deferred due to CKD.  On exam today, I am happy with how John [name] looks.  He has done well postoperatively, and feels back to baseline.  08/19/2022 CT was reviewed demonstrating no endoleak, and no sac expansion.  I asked that he continue with a daily aspirin and high intensity statin.  My plan is to see him  in 6 months for follow-up duplex ultrasound of the aorta.  He was asked to call my office should any questions or concerns arise.   Victorino Sparrow, MD [facility] [phone]

## 2022-08-28 ENCOUNTER — Other Ambulatory Visit: Payer: Self-pay

## 2022-08-28 DIAGNOSIS — Z8679 Personal history of other diseases of the circulatory system: Secondary | ICD-10-CM

## 2022-09-18 DIAGNOSIS — J9610 Chronic respiratory failure, unspecified whether with hypoxia or hypercapnia: Secondary | ICD-10-CM | POA: Diagnosis not present

## 2022-09-18 DIAGNOSIS — J449 Chronic obstructive pulmonary disease, unspecified: Secondary | ICD-10-CM | POA: Diagnosis not present

## 2022-09-25 DIAGNOSIS — J9611 Chronic respiratory failure with hypoxia: Secondary | ICD-10-CM | POA: Diagnosis not present

## 2022-10-04 ENCOUNTER — Encounter: Payer: Self-pay | Admitting: Pulmonary Disease

## 2022-10-04 ENCOUNTER — Ambulatory Visit: Payer: Medicare Other | Admitting: Pulmonary Disease

## 2022-10-04 VITALS — BP 122/64 | HR 56 | Ht 69.0 in | Wt 152.4 lb

## 2022-10-04 DIAGNOSIS — J432 Centrilobular emphysema: Secondary | ICD-10-CM | POA: Diagnosis not present

## 2022-10-04 DIAGNOSIS — J9611 Chronic respiratory failure with hypoxia: Secondary | ICD-10-CM | POA: Diagnosis not present

## 2022-10-04 DIAGNOSIS — J9612 Chronic respiratory failure with hypercapnia: Secondary | ICD-10-CM

## 2022-10-04 MED ORDER — ALBUTEROL SULFATE (2.5 MG/3ML) 0.083% IN NEBU: 2.5000 mg | INHALATION_SOLUTION | RESPIRATORY_TRACT | 5 refills | Status: DC | PRN

## 2022-10-04 MED ORDER — ALBUTEROL SULFATE HFA 108 (90 BASE) MCG/ACT IN AERS: 2.0000 | INHALATION_SPRAY | RESPIRATORY_TRACT | 5 refills | Status: DC | PRN

## 2022-10-04 MED ORDER — ALPRAZOLAM 0.25 MG PO TABS: 0.2500 mg | ORAL_TABLET | Freq: Three times a day (TID) | ORAL | 5 refills | Status: DC | PRN

## 2022-10-04 NOTE — Patient Instructions (Signed)
Follow up in 2 months

## 2022-10-04 NOTE — Progress Notes (Signed)
[facility]  Chief Complaint  Patient presents with   Follow-up    Pt f/u for COPD currently using 3L o2 but will go to 4L PRN.    Past Surgical History:  He  has a past surgical history that includes Finger surgery (Left); Vasectomy; Coronary angioplasty with stent (09/17/2010; 09/28/2014); Cardiac catheterization (~ 2012); Nasal endoscopy with epistaxis control (Bilateral, 03/2012); left heart catheterization with coronary angiogram (N/A, 09/28/2014); Abdominal aortic endovascular stent graft (N/A, 07/28/2022); and Ultrasound guidance for vascular access (N/A, 07/28/2022).  Past Medical History:  PNA, HTN, HLD, CAD, Seizure, ETOH, infrarenal abdominal aortic aneurysm  Constitutional:  BP 122/64   Pulse (!) 56   Ht 5\' 9"  (1.753 m)   Wt 152 lb 6.4 oz (69.1 kg)   SpO2 95%   BMI 22.51 kg/m   Brief Summary:  John [name] is a 67 y.o. 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      Subjective:   He is here with his wife.  He notices getting winded more easily.  No change in cough or sputum.  Not wheezing much.  Ankle swelling okay with medicines.  Using NIV on more regular basis, and this is helping.  He notices his voice give out when he talks for a period of time.  He took a full dose of trazodone one night.  This caused dizziness, agitation, and palpitations.  He hasn't used this since.  He uses xanax and this helps relax his mind, make his breathing feel easier, and helps him fall asleep.  Physical Exam:   Appearance - well kempt, in scooter, wearing oxygen  ENMT - no sinus tenderness, no oral exudate, no LAN, Mallampati 2 airway, no stridor  Respiratory - decrased breath sounds bilaterally, no wheezing or rales  CV - s1s2 regular rate and rhythm, no murmurs  Ext - no clubbing, no edema  Skin - no rashes  Psych - normal mood and  affect         Pulmonary testing:  PFT 12/09/19 >> FEV1 .059 (16%), FEV1% 37, DLCO 27% A1AT 03/09/20 >> 110, MZ ABG 02/12/21 >> pH 7.28, PCO2 80.9, PO2 109 on 40% FiO2 PFT 04/11/21 >> FEV1 0.6 (18%), FEV1% 26, DLCO 21%  Chest Imaging:  CT angio chest 11/23/19 >> moderate centrilobular emphysema, mild infrahilar BTX, 6 mm nodule LUL CT chest 01/02/21 >> no change LUL nodule, new 1.1 cm LLL opacity CT chest 03/28/21 >> severe emphysema, 2.2 x 0.8 cm band like opacity RLL from ATX, mucus plugging, 3 mm nodule RUL, 1.6 x 0.7 cm nodularity LUL CT chest 04/09/21 >> diffuse centrilobular emphysema, stable nodule LUL, 8 mm nodule RLL CT chest 03/27/22 >> stable GGO LLL  Cardiac Tests:  Echo 04/19/20 >> EF 65 to 70%  Social History:  He  reports that he quit smoking about 2 years ago. His smoking use included cigarettes. He started smoking about 54 years ago. He has a 100.00 pack-year smoking history. He has never used smokeless tobacco. He reports current alcohol use of about 144.0 standard drinks of alcohol per week. He reports that he does not use drugs.  Family History:  His family history includes Brain cancer in his mother; Heart disease in his father.     Assessment/Plan:   Severe COPD from emphysema. - he had assessment at [facility] in July 2022 and informed he wasn't a candidate for lung transplant  due to coronary artery disease and infrarenal abdominal aortic aneurysm - continue breztri with spacer - prn albuterol, mucinex - he has a nebulizer; he has albuterol nebulizer medicine sent to [place]  Chronic respiratory failure with hypoxia and hypercapnia. - he uses [employer] for his DME - goal SpO2 > 90% - 3 to 4 liters oxygen 24/7 - NIV at night and prn during the day  Lung nodule. - he would like to continue radiographic monitoring - he will need follow up CT chest without contrast in July 2024  Insomnia, anxiety. - prn xanax; refill sent  Coronary artery disease. - he is  followed by Dr. Darlina Guys with [facility]  Infrarenal abdominal aortic aneurysm. - s/p endovascular repair - follow up with Dr. Orlie Pollen with vascular surgery  Goals of care. - he was clear he would not want intubation or cardiac resuscitation in the event of cardiac arrest - he is okay to follow up CT imaging, and continue NIMV and supplemental oxygen - he is concerned about dying in the hospital - followed by palliative care  Time Spent Involved in Patient Care on Day of Examination:  20 minutes  Follow up:   Patient Instructions  Follow up in 2 months  Medication List:   Allergies as of 10/04/2022       Reactions   Penicillins Other (See Comments)   Passes out        Medication List        Accurate as of October 04, 2022 12:53 PM. If you have any questions, ask your nurse or doctor.          STOP taking these medications    traZODone 100 MG tablet Commonly known as: DESYREL Stopped by: Chesley Mires, MD       TAKE these medications    albuterol 108 (90 Base) MCG/ACT inhaler Commonly known as: ProAir HFA Inhale 2 puffs into the lungs every 4 (four) hours as needed for wheezing or shortness of breath. 2 puffs every 4 hours as needed only  if your can't catch your breath   albuterol (2.5 MG/3ML) 0.083% nebulizer solution Commonly known as: PROVENTIL Take 3 mLs (2.5 mg total) by nebulization every 4 (four) hours as needed for wheezing or shortness of breath.   ALPRAZolam 0.25 MG tablet Commonly known as: XANAX Take 1 tablet (0.25 mg total) by mouth 3 (three) times daily as needed for anxiety.   aspirin EC 81 MG tablet Take 81 mg by mouth daily.   atorvastatin 40 MG tablet Commonly known as: LIPITOR Take 1 tablet (40 mg total) by mouth daily.   Breztri Aerosphere 160-9-4.8 MCG/ACT Aero Generic drug: Budeson-Glycopyrrol-Formoterol Inhale 2 puffs into the lungs in the morning and at bedtime.   furosemide 40 MG tablet Commonly known  as: LASIX Take 1 tablet (40 mg total) by mouth daily.   guaiFENesin 600 MG 12 hr tablet Commonly known as: Mucinex Take 2 tablets (1,200 mg total) by mouth 2 (two) times daily as needed for cough or to loosen phlegm.   multivitamin with minerals Tabs tablet Take 1 tablet by mouth daily.   nitroGLYCERIN 0.4 MG SL tablet Commonly known as: Nitrostat Place 1 tablet (0.4 mg total) under the tongue every 5 (five) minutes as needed for chest pain.   OXYGEN Inhale 2-3 L into the lungs.        Signature:  Chesley Mires, MD [place] Pager - [phone] 10/04/2022, 12:53 PM

## 2022-10-10 DIAGNOSIS — J449 Chronic obstructive pulmonary disease, unspecified: Secondary | ICD-10-CM | POA: Diagnosis not present

## 2022-10-10 DIAGNOSIS — Z515 Encounter for palliative care: Secondary | ICD-10-CM | POA: Diagnosis not present

## 2022-10-14 ENCOUNTER — Encounter: Payer: Self-pay | Admitting: Internal Medicine

## 2022-10-14 ENCOUNTER — Ambulatory Visit (INDEPENDENT_AMBULATORY_CARE_PROVIDER_SITE_OTHER): Payer: Medicare Other | Admitting: Internal Medicine

## 2022-10-14 VITALS — BP 118/68 | HR 109 | Ht 69.0 in | Wt 154.0 lb

## 2022-10-14 DIAGNOSIS — J9612 Chronic respiratory failure with hypercapnia: Secondary | ICD-10-CM | POA: Diagnosis not present

## 2022-10-14 DIAGNOSIS — I7133 Infrarenal abdominal aortic aneurysm, ruptured: Secondary | ICD-10-CM | POA: Diagnosis not present

## 2022-10-14 DIAGNOSIS — Z1159 Encounter for screening for other viral diseases: Secondary | ICD-10-CM | POA: Diagnosis not present

## 2022-10-14 DIAGNOSIS — J9611 Chronic respiratory failure with hypoxia: Secondary | ICD-10-CM | POA: Diagnosis not present

## 2022-10-14 DIAGNOSIS — Z0001 Encounter for general adult medical examination with abnormal findings: Secondary | ICD-10-CM | POA: Diagnosis not present

## 2022-10-14 DIAGNOSIS — E782 Mixed hyperlipidemia: Secondary | ICD-10-CM

## 2022-10-14 DIAGNOSIS — E559 Vitamin D deficiency, unspecified: Secondary | ICD-10-CM | POA: Diagnosis not present

## 2022-10-14 DIAGNOSIS — J449 Chronic obstructive pulmonary disease, unspecified: Secondary | ICD-10-CM

## 2022-10-14 DIAGNOSIS — R911 Solitary pulmonary nodule: Secondary | ICD-10-CM

## 2022-10-14 DIAGNOSIS — Z7189 Other specified counseling: Secondary | ICD-10-CM | POA: Insufficient documentation

## 2022-10-14 DIAGNOSIS — F411 Generalized anxiety disorder: Secondary | ICD-10-CM | POA: Diagnosis not present

## 2022-10-14 NOTE — Progress Notes (Signed)
Established Patient Office Visit  Subjective:  Patient ID: John [name], male    DOB: April 12, 1956  Age: 67 y.o. MRN: [id number]  CC:  Chief Complaint  Patient presents with   Annual Exam    HPI John [name] is a 67 y.o. male with past medical history of CAD s/p stent placement, chronic hypoxic respiratory failure due to COPD and chronic leg edema who presents for annual physical.  Ruptured AAA: He had rupture of infrarenal AAA in 11/23 and had endovascular repair.  His abdominal pain has resolved now.  His BP is WNL today.  He is currently on aspirin and statin.  CAD and HTN: He has h/o CAD and follows up with Cardiology. BP is well-controlled. Takes medications regularly. Patient denies headache, dizziness, chest pain, or palpitations.   Chronic hypoxic respiratory failure due to COPD: He has been using home O2 - 3 lpm currently.  He has dyspnea on minimal exertion.  Denies any recent worsening of cough, or fatigue.  Denies any fever, chills, sore throat or nasal congestion.  He has been using Breztri and as needed albuterol for dyspnea.    Past Medical History:  Diagnosis Date   Alcohol abuse 12/27/2010   Allergic rhinitis, cause unspecified 12/27/2010   Anal warts 04/11/2012   CAD, NATIVE VESSEL 10/04/2010   COPD (chronic obstructive pulmonary disease) (HCC)    "a touch" (09/28/2013)   History of blood transfusion 03/2012   related to nose bleed   HYPERLIPIDEMIA-MIXED 10/04/2010   HYPERTENSION, BENIGN 10/04/2010   Myocardial infarction (HCC) 09/16/2010   Pneumonia    "when I was a kid"   Stented coronary artery    Mid LAD    TOBACCO ABUSE 10/04/2010    Past Surgical History:  Procedure Laterality Date   ABDOMINAL AORTIC ENDOVASCULAR STENT GRAFT N/A 07/28/2022   Procedure: ABDOMINAL AORTIC ENDOVASCULAR STENT GRAFT;  Surgeon: Victorino Sparrow, MD;  Location: [facility] OR;  Service: Vascular;  Laterality: N/A;   CARDIAC CATHETERIZATION  ~ 2012   CORONARY ANGIOPLASTY WITH  STENT PLACEMENT  09/17/2010; 09/28/2014   "1; 2"   FINGER SURGERY Left    "almost cut off" tip of 2nd digit   LEFT HEART CATHETERIZATION WITH CORONARY ANGIOGRAM N/A 09/28/2014   Procedure: LEFT HEART CATHETERIZATION WITH CORONARY ANGIOGRAM;  Surgeon: Kathleene Hazel, MD;  Location: [facility] CATH LAB;  Service: Cardiovascular;  Laterality: N/A;   NASAL ENDOSCOPY WITH EPISTAXIS CONTROL Bilateral 03/2012   ULTRASOUND GUIDANCE FOR VASCULAR ACCESS N/A 07/28/2022   Procedure: ULTRASOUND GUIDANCE FOR VASCULAR ACCESS;  Surgeon: Victorino Sparrow, MD;  Location: [facility] OR;  Service: Vascular;  Laterality: N/A;   VASECTOMY      Family History  Problem Relation Age of Onset   Brain cancer Mother    Heart disease Father     Social History   Socioeconomic History   Marital status: Single    Spouse name: Not on file   Number of children: Not on file   Years of education: Not on file   Highest education level: Not on file  Occupational History   Occupation: Lobbyist maintenance work     Employer: [employer]    Comment: Ronette [employer]eter  Tobacco Use   Smoking status: Former    Packs/day: 2.00    Years: 50.00    Total pack years: 100.00    Types: Cigarettes    Start date: 1970    Quit date: 04/18/2020    Years since quitting: 2.4  Smokeless tobacco: Never   Tobacco comments:    2 plus packs per day. He has a 100 + pack - year history of tobacco abuse currently. Former 4 ppd for 25 years.  Vaping Use   Vaping Use: Never used  Substance and Sexual Activity   Alcohol use: Yes    Alcohol/week: 144.0 standard drinks of alcohol    Types: 144 Cans of beer per week    Comment:  history 12 pack of beer per night".  2021- drinks 3 beer/day   Drug use: No   Sexual activity: Yes    Birth control/protection: None  Other Topics Concern   Not on file  Social History Narrative   The patient lives in [place] with his girlfriend. 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   Social  Determinants of Health   Financial Resource Strain: Low Risk  (08/08/2022)   Overall Financial Resource Strain (CARDIA)    Difficulty of Paying Living Expenses: Not hard at all  Food Insecurity: No Food Insecurity (08/08/2022)   Hunger Vital Sign    Worried About Running Out of Food in the Last Year: Never true    Ran Out of Food in the Last Year: Never true  Transportation Needs: No Transportation Needs (08/08/2022)   PRAPARE - Administrator, Civil Service (Medical): No    Lack of Transportation (Non-Medical): No  Physical Activity: Inactive (06/02/2021)   Exercise Vital Sign    Days of Exercise per Week: 0 days    Minutes of Exercise per Session: 0 min  Stress: No Stress Concern Present (08/08/2022)   [employer] of Occupational Health - Occupational Stress Questionnaire    Feeling of Stress : Not at all  Social Connections: Moderately Integrated (08/08/2022)   Social Connection and Isolation Panel [NHANES]    Frequency of Communication with Friends and Family: More than three times a week    Frequency of Social Gatherings with Friends and Family: Three times a week    Attends Religious Services: Never    Active Member of Clubs or Organizations: Yes    Attends Banker Meetings: Never    Marital Status: Living with partner  Intimate Partner Violence: Not At Risk (06/02/2021)   Humiliation, Afraid, Rape, and Kick questionnaire    Fear of Current or Ex-Partner: No    Emotionally Abused: No    Physically Abused: No    Sexually Abused: No    Outpatient Medications Prior to Visit  Medication Sig Dispense Refill   albuterol (PROAIR HFA) 108 (90 Base) MCG/ACT inhaler Inhale 2 puffs into the lungs every 4 (four) hours as needed for wheezing or shortness of breath. 2 puffs every 4 hours as needed only  if your can't catch your breath 8 g 5   albuterol (PROVENTIL) (2.5 MG/3ML) 0.083% nebulizer solution Take 3 mLs (2.5 mg total) by nebulization every 4 (four)  hours as needed for wheezing or shortness of breath. 360 mL 5   ALPRAZolam (XANAX) 0.25 MG tablet Take 1 tablet (0.25 mg total) by mouth 3 (three) times daily as needed for anxiety. 60 tablet 5   aspirin EC 81 MG tablet Take 81 mg by mouth daily.     atorvastatin (LIPITOR) 40 MG tablet Take 1 tablet (40 mg total) by mouth daily. 90 tablet 3   Budeson-Glycopyrrol-Formoterol (BREZTRI AEROSPHERE) 160-9-4.8 MCG/ACT AERO Inhale 2 puffs into the lungs in the morning and at bedtime. 10.7 g  11   furosemide (LASIX) 40 MG tablet Take 1 tablet (40 mg total) by mouth daily. 90 tablet 3   guaiFENesin (MUCINEX) 600 MG 12 hr tablet Take 2 tablets (1,200 mg total) by mouth 2 (two) times daily as needed for cough or to loosen phlegm.     Multiple Vitamin (MULTIVITAMIN WITH MINERALS) TABS tablet Take 1 tablet by mouth daily.     nitroGLYCERIN (NITROSTAT) 0.4 MG SL tablet Place 1 tablet (0.4 mg total) under the tongue every 5 (five) minutes as needed for chest pain. 25 tablet 6   OXYGEN Inhale 2-3 L into the lungs.     No facility-administered medications prior to visit.    Allergies  Allergen Reactions   Penicillins Other (See Comments)    Passes out    ROS Review of Systems  Constitutional:  Positive for fatigue. Negative for chills and fever.  HENT:  Negative for congestion and sore throat.   Eyes:  Negative for pain and discharge.  Respiratory:  Positive for cough and shortness of breath (Chronic).   Cardiovascular:  Negative for chest pain and palpitations.  Gastrointestinal:  Negative for constipation, diarrhea, nausea and vomiting.  Endocrine: Negative for polydipsia and polyuria.  Genitourinary:  Negative for dysuria and hematuria.  Musculoskeletal:  Negative for neck pain and neck stiffness.  Skin:  Negative for rash.  Neurological:  Negative for dizziness, weakness, numbness and headaches.  Psychiatric/Behavioral:  Negative for agitation and behavioral problems.       Objective:     Physical Exam Vitals reviewed.  Constitutional:      General: He is not in acute distress.    Appearance: He is not diaphoretic.     Comments: In wheelchair  HENT:     Head: Normocephalic and atraumatic.     Nose: Nose normal.     Mouth/Throat:     Mouth: Mucous membranes are moist.  Eyes:     General: No scleral icterus.    Extraocular Movements: Extraocular movements intact.  Cardiovascular:     Rate and Rhythm: Normal rate and regular rhythm.     Pulses: Normal pulses.     Heart sounds: Normal heart sounds. No murmur heard. Pulmonary:     Breath sounds: Normal breath sounds. No wheezing or rales.     Comments: On 3 l O2 Abdominal:     Palpations: Abdomen is soft.     Tenderness: There is no abdominal tenderness.  Musculoskeletal:     Cervical back: Neck supple. No tenderness.     Right lower leg: No edema.     Left lower leg: No edema.  Skin:    General: Skin is warm.     Findings: No rash.  Neurological:     General: No focal deficit present.     Mental Status: He is alert and oriented to person, place, and time.     Cranial Nerves: No cranial nerve deficit.     Sensory: No sensory deficit.     Motor: Weakness (B/l LE  - 4/5) present.  Psychiatric:        Mood and Affect: Mood normal.        Behavior: Behavior normal.     BP 118/68 (BP Location: Left Arm, Cuff Size: Normal)   Pulse (!) 109   Ht 5\' 9"  (1.753 m)   Wt 154 lb (69.9 kg)   SpO2 (!) 89%   BMI 22.74 kg/m  Wt Readings from Last 3 Encounters:  10/14/22 154 lb (69.9  kg)  10/04/22 152 lb 6.4 oz (69.1 kg)  08/23/22 154 lb (69.9 kg)    Lab Results  Component Value Date   TSH 2.650 10/14/2022   Lab Results  Component Value Date   WBC 6.6 10/14/2022   HGB 11.8 (L) 10/14/2022   HCT 37.5 10/14/2022   MCV 93 10/14/2022   PLT 245 10/14/2022   Lab Results  Component Value Date   NA 142 10/14/2022   K 4.6 10/14/2022   CO2 34 (H) 10/14/2022   GLUCOSE 95 10/14/2022   BUN 16 10/14/2022    CREATININE 1.10 10/14/2022   BILITOT 0.2 10/14/2022   ALKPHOS 147 (H) 10/14/2022   AST 29 10/14/2022   ALT 23 10/14/2022   PROT 6.3 10/14/2022   ALBUMIN 3.8 (L) 10/14/2022   CALCIUM 9.4 10/14/2022   ANIONGAP 10 07/30/2022   EGFR 74 10/14/2022   GFR 92.34 06/14/2020   Lab Results  Component Value Date   CHOL 159 10/14/2022   Lab Results  Component Value Date   HDL 76 10/14/2022   Lab Results  Component Value Date   LDLCALC 72 10/14/2022   Lab Results  Component Value Date   TRIG 55 10/14/2022   Lab Results  Component Value Date   CHOLHDL 2.1 10/14/2022   Lab Results  Component Value Date   HGBA1C 5.1 04/24/2020      Assessment & Plan:   Problem List Items Addressed This Visit       Cardiovascular and Mediastinum   Ruptured abdominal aortic aneurysm (AAA) ([place])    S/p endovascular repair, followed by vascular surgery BP WNL On aspirin and statin      Relevant Orders   CMP14+EGFR (Completed)   CBC with Differential/Platelet (Completed)     Respiratory   Chronic respiratory failure with hypoxia and hypercapnia (HCC)    On 3 L O2 at home Due to underlying COPD On Breztri Albuterol nebs and inhaler as needed Follows up with Dr. Halford Chessman Had evaluation by transplant team, not a surgical candidate      Relevant Orders   TSH (Completed)   CMP14+EGFR (Completed)   CBC with Differential/Platelet (Completed)   Lung nodule    Significant smoking history in the past Planned to get repeat CT chest      Chronic obstructive pulmonary disease ([place])    Well-controlled currently with Breztri Albuterol PRN F/u with Pulmonology        Other   HLD (hyperlipidemia)    On statin Reviewed lipid profile      Relevant Orders   Lipid panel (Completed)   Encounter for general adult medical examination with abnormal findings - Primary    Physical exam as documented. Fasting blood tests ordered. Advised to get Shingrix and Tdap vaccine at local pharmacy.       GAD (generalized anxiety disorder)    Takes Xanax PRN      Relevant Orders   TSH (Completed)   Other Visit Diagnoses     Vitamin D deficiency       Relevant Orders   VITAMIN D 25 Hydroxy (Vit-D Deficiency, Fractures) (Completed)   Need for hepatitis C screening test       Relevant Orders   Hepatitis C Antibody (Completed)       No orders of the defined types were placed in this encounter.   Follow-up: Return in about 6 months (around 04/14/2023).    Lindell Spar, MD

## 2022-10-14 NOTE — Patient Instructions (Addendum)
Please continue to take medications as prescribed.  Please continue to use inhalers as prescribed.

## 2022-10-15 DIAGNOSIS — F411 Generalized anxiety disorder: Secondary | ICD-10-CM | POA: Insufficient documentation

## 2022-10-15 LAB — CBC WITH DIFFERENTIAL/PLATELET
Basophils Absolute: 0.1 10*3/uL (ref 0.0–0.2)
Basos: 1 %
EOS (ABSOLUTE): 0.1 10*3/uL (ref 0.0–0.4)
Eos: 2 %
Hematocrit: 37.5 % (ref 37.5–51.0)
Hemoglobin: 11.8 g/dL — ABNORMAL LOW (ref 13.0–17.7)
Immature Grans (Abs): 0 10*3/uL (ref 0.0–0.1)
Immature Granulocytes: 0 %
Lymphocytes Absolute: 0.8 10*3/uL (ref 0.7–3.1)
Lymphs: 12 %
MCH: 29.3 pg (ref 26.6–33.0)
MCHC: 31.5 g/dL (ref 31.5–35.7)
MCV: 93 fL (ref 79–97)
Monocytes Absolute: 0.6 10*3/uL (ref 0.1–0.9)
Monocytes: 9 %
Neutrophils Absolute: 5 10*3/uL (ref 1.4–7.0)
Neutrophils: 76 %
Platelets: 245 10*3/uL (ref 150–450)
RBC: 4.03 x10E6/uL — ABNORMAL LOW (ref 4.14–5.80)
RDW: 14 % (ref 11.6–15.4)
WBC: 6.6 10*3/uL (ref 3.4–10.8)

## 2022-10-15 LAB — CMP14+EGFR
ALT: 23 IU/L (ref 0–44)
AST: 29 IU/L (ref 0–40)
Albumin/Globulin Ratio: 1.5 (ref 1.2–2.2)
Albumin: 3.8 g/dL — ABNORMAL LOW (ref 3.9–4.9)
Alkaline Phosphatase: 147 IU/L — ABNORMAL HIGH (ref 44–121)
BUN/Creatinine Ratio: 15 (ref 10–24)
BUN: 16 mg/dL (ref 8–27)
Bilirubin Total: 0.2 mg/dL (ref 0.0–1.2)
CO2: 34 mmol/L — ABNORMAL HIGH (ref 20–29)
Calcium: 9.4 mg/dL (ref 8.6–10.2)
Chloride: 97 mmol/L (ref 96–106)
Creatinine, Ser: 1.1 mg/dL (ref 0.76–1.27)
Globulin, Total: 2.5 g/dL (ref 1.5–4.5)
Glucose: 95 mg/dL (ref 70–99)
Potassium: 4.6 mmol/L (ref 3.5–5.2)
Sodium: 142 mmol/L (ref 134–144)
Total Protein: 6.3 g/dL (ref 6.0–8.5)
eGFR: 74 mL/min/{1.73_m2} (ref >59)

## 2022-10-15 LAB — LIPID PANEL
Chol/HDL Ratio: 2.1 ratio (ref 0.0–5.0)
Cholesterol, Total: 159 mg/dL (ref 100–199)
HDL: 76 mg/dL (ref >39)
LDL Chol Calc (NIH): 72 mg/dL (ref 0–99)
Triglycerides: 55 mg/dL (ref 0–149)
VLDL Cholesterol Cal: 11 mg/dL (ref 5–40)

## 2022-10-15 LAB — TSH: TSH: 2.65 u[IU]/mL (ref 0.450–4.500)

## 2022-10-15 LAB — HEPATITIS C ANTIBODY: Hep C Virus Ab: NONREACTIVE

## 2022-10-15 LAB — VITAMIN D 25 HYDROXY (VIT D DEFICIENCY, FRACTURES): Vit D, 25-Hydroxy: 29.6 ng/mL — ABNORMAL LOW (ref 30.0–100.0)

## 2022-10-15 NOTE — Assessment & Plan Note (Signed)
Well-controlled currently with Breztri Albuterol PRN F/u with Pulmonology

## 2022-10-15 NOTE — Assessment & Plan Note (Signed)
On 3 L O2 at home Due to underlying COPD On Breztri Albuterol nebs and inhaler as needed Follows up with Dr. Halford Chessman Had evaluation by transplant team, not a surgical candidate

## 2022-10-15 NOTE — Assessment & Plan Note (Signed)
Physical exam as documented. Fasting blood tests ordered. Advised to get Shingrix and Tdap vaccine at local pharmacy.

## 2022-10-15 NOTE — Assessment & Plan Note (Signed)
Takes Xanax PRN

## 2022-10-15 NOTE — Assessment & Plan Note (Signed)
Significant smoking history in the past Planned to get repeat CT chest

## 2022-10-15 NOTE — Assessment & Plan Note (Signed)
S/p endovascular repair, followed by vascular surgery BP WNL On aspirin and statin

## 2022-10-15 NOTE — Assessment & Plan Note (Signed)
On statin Reviewed lipid profile 

## 2022-10-19 DIAGNOSIS — J449 Chronic obstructive pulmonary disease, unspecified: Secondary | ICD-10-CM | POA: Diagnosis not present

## 2022-10-26 DIAGNOSIS — J9611 Chronic respiratory failure with hypoxia: Secondary | ICD-10-CM | POA: Diagnosis not present

## 2022-11-12 DIAGNOSIS — J449 Chronic obstructive pulmonary disease, unspecified: Secondary | ICD-10-CM | POA: Diagnosis not present

## 2022-11-12 DIAGNOSIS — Z515 Encounter for palliative care: Secondary | ICD-10-CM | POA: Diagnosis not present

## 2022-11-19 DIAGNOSIS — J449 Chronic obstructive pulmonary disease, unspecified: Secondary | ICD-10-CM | POA: Diagnosis not present

## 2022-11-19 DIAGNOSIS — J9610 Chronic respiratory failure, unspecified whether with hypoxia or hypercapnia: Secondary | ICD-10-CM | POA: Diagnosis not present

## 2022-11-24 DIAGNOSIS — J9611 Chronic respiratory failure with hypoxia: Secondary | ICD-10-CM | POA: Diagnosis not present

## 2022-11-28 NOTE — Progress Notes (Signed)
Chief Complaint  Patient presents with   Follow-up    CAD   History of Present Illness: 67 yo male with history of CAD , HTN, hyperlipidemia, chronic diastolic CHF and severe COPD who is here today for cardiac follow up. He was admitted to [facility] December 2011 with a NSTEMI. Cardiac cath 09/17/10 and was found to have a severe stenosis of the LAD which was treated with a drug eluting stent. He was also found to have moderate disease in the RCA (70%) and Circumflex (70%) managed medically. Cardiac cath June 2012 with stable CAD. He  had 3 admissions in July 2013 with severe epistaxis causing hemorrhagic shock with tachycardia and drop in blood pressure. This was treated with packing and transfusion. His Plavix was stopped. He was admitted January 2016 with unstable angina. Cardiac cath January 2016 with severe mid LAD stenosis, severe mid Circumflex stenosis. I placed a drug eluting stent in the mid LAD and a drug eluting stent in the mid Circumflex. LV function normal. I saw him in May 2016 and he c/o fatigue and dyspnea. His Coreg was stopped and he had resolution of his symptoms. He missed follow up appointments in 2017-2020 due to lack of insurance and cost. I saw him February 2020 with c/o dyspnea and lower extremity edema. He had been off of all medications due to losing his job and having no money. He responded well to Lasix. He has established in our pulmonary office with Dr. Halford Chessman. He was admitted to [facility] July 2021 with acute respiratory failure secondary to COPD exacerbation and community acquired pneumonia and was intubated. He was euvolemic on admission per notes but developed LE edema during his hospital stay. He was discharged on Lasix 40 mg po BID. Echo 04/19/20 with LVEF=65-70%. No valve disease. He has stopped smoking. He is on supplemental O2 24 hours per day now. He was seen at [facility] July 2022 and told he was not a candidate for lung transplant. Cardiac cath at [facility] July 2022  with 60% mid LAD stenosis, patent LAD stents, patent Circumflex stent. Echo July 2022 at [facility] with normal LV function and no valve disease. CTA abdomen at [facility] with 6.9 cm AAA. He was seen by Vascular surgery there and turned down for intervention. He was told he had 6 months to live and was made DNR and placed on palliative care. He was doing well overall when I saw him in September 2023. He was admitted to [facility] November 2023 with abdominal pain and his AAA had ruptured. He underwent endovascular aortic repair per Dr. Unk Lightning. He has done well since then.   He is here today for follow up. The patient denies any chest pain, palpitations, lower extremity edema, orthopnea, PND, dizziness, near syncope or syncope. No change in his baseline dyspnea.   Primary Care Physician: Lindell Spar, MD  Past Medical History:  Diagnosis Date   Alcohol abuse 12/27/2010   Allergic rhinitis, cause unspecified 12/27/2010   Anal warts 04/11/2012   CAD, NATIVE VESSEL 10/04/2010   COPD (chronic obstructive pulmonary disease) ([place])    "a touch" (09/28/2013)   History of blood transfusion 03/2012   related to nose bleed   HYPERLIPIDEMIA-MIXED 10/04/2010   HYPERTENSION, BENIGN 10/04/2010   Myocardial infarction ([place]) 09/16/2010   Pneumonia    "when I was a kid"   Stented coronary artery    Mid LAD    TOBACCO ABUSE 10/04/2010    Past Surgical History:  Procedure  Laterality Date   ABDOMINAL AORTIC ENDOVASCULAR STENT GRAFT N/A 07/28/2022   Procedure: ABDOMINAL AORTIC ENDOVASCULAR STENT GRAFT;  Surgeon: Broadus John, MD;  Location: [facility];  Service: Vascular;  Laterality: N/A;   CARDIAC CATHETERIZATION  ~ 2012   CORONARY ANGIOPLASTY WITH STENT PLACEMENT  09/17/2010; 09/28/2014   "1; 2"   FINGER SURGERY Left    "almost cut off" tip of 2nd digit   LEFT HEART CATHETERIZATION WITH CORONARY ANGIOGRAM N/A 09/28/2014   Procedure: LEFT HEART CATHETERIZATION WITH CORONARY ANGIOGRAM;  Surgeon: Burnell Blanks, MD;   Location: [facility] CATH LAB;  Service: Cardiovascular;  Laterality: N/A;   NASAL ENDOSCOPY WITH EPISTAXIS CONTROL Bilateral 03/2012   ULTRASOUND GUIDANCE FOR VASCULAR ACCESS N/A 07/28/2022   Procedure: ULTRASOUND GUIDANCE FOR VASCULAR ACCESS;  Surgeon: Broadus John, MD;  Location: [facility] OR;  Service: Vascular;  Laterality: N/A;   VASECTOMY      Current Outpatient Medications  Medication Sig Dispense Refill   albuterol (PROAIR HFA) 108 (90 Base) MCG/ACT inhaler Inhale 2 puffs into the lungs every 4 (four) hours as needed for wheezing or shortness of breath. 2 puffs every 4 hours as needed only  if your can't catch your breath 8 g 5   albuterol (PROVENTIL) (2.5 MG/3ML) 0.083% nebulizer solution Take 3 mLs (2.5 mg total) by nebulization every 4 (four) hours as needed for wheezing or shortness of breath. 360 mL 5   ALPRAZolam (XANAX) 0.25 MG tablet Take 1 tablet (0.25 mg total) by mouth 3 (three) times daily as needed for anxiety. 60 tablet 5   aspirin EC 81 MG tablet Take 81 mg by mouth daily.     Budeson-Glycopyrrol-Formoterol (BREZTRI AEROSPHERE) 160-9-4.8 MCG/ACT AERO Inhale 2 puffs into the lungs in the morning and at bedtime. 10.7 g 11   guaiFENesin (MUCINEX) 600 MG 12 hr tablet Take 2 tablets (1,200 mg total) by mouth 2 (two) times daily as needed for cough or to loosen phlegm.     Multiple Vitamin (MULTIVITAMIN WITH MINERALS) TABS tablet Take 1 tablet by mouth daily.     OXYGEN Inhale 2-3 L into the lungs.     atorvastatin (LIPITOR) 40 MG tablet Take 1 tablet (40 mg total) by mouth daily. 90 tablet 3   furosemide (LASIX) 40 MG tablet Take 1 tablet (40 mg total) by mouth daily. 90 tablet 3   nitroGLYCERIN (NITROSTAT) 0.4 MG SL tablet Place 1 tablet (0.4 mg total) under the tongue every 5 (five) minutes as needed for chest pain. 25 tablet 6   No current facility-administered medications for this visit.    Allergies  Allergen Reactions   Penicillins Other (See Comments)    Passes out     Social History   Socioeconomic History   Marital status: Single    Spouse name: Not on file   Number of children: Not on file   Years of education: Not on file   Highest education level: Not on file  Occupational History   Occupation: Health and safety inspector work     Employer: [employer]    Comment: [name]  Tobacco Use   Smoking status: Former    Packs/day: 2.00    Years: 50.00    Total pack years: 100.00    Types: Cigarettes    Start date: 42    Quit date: 04/18/2020    Years since quitting: 2.6   Smokeless tobacco: Never   Tobacco comments:    2 plus packs per day. He has a 100 +  pack - year history of tobacco abuse currently. Former 4 ppd for 25 years.  Vaping Use   Vaping Use: Never used  Substance and Sexual Activity   Alcohol use: Yes    Alcohol/week: 144.0 standard drinks of alcohol    Types: 144 Cans of beer per week    Comment:  history 12 pack of beer per night".  2021- drinks 3 beer/day   Drug use: No   Sexual activity: Yes    Birth control/protection: None  Other Topics Concern   Not on file  Social History Narrative   The patient lives in [place] with his girlfriend. 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 He is not routinely exercising.   Social Determinants of Health   Financial Resource Strain: Low Risk  (08/08/2022)   Overall Financial Resource Strain (CARDIA)    Difficulty of Paying Living Expenses: Not hard at all  Food Insecurity: No Food Insecurity (08/08/2022)   Hunger Vital Sign    Worried About Running Out of Food in the Last Year: Never true    Ran Out of Food in the Last Year: Never true  Transportation Needs: No Transportation Needs (08/08/2022)   PRAPARE - Hydrologist (Medical): No    Lack of Transportation (Non-Medical): No  Physical Activity: Inactive (06/02/2021)   Exercise Vital Sign    Days of Exercise per Week: 0 days    Minutes of Exercise per Session: 0 min  Stress: No Stress Concern  Present (08/08/2022)   [place]    Feeling of Stress : Not at all  Social Connections: Moderately Integrated (08/08/2022)   Social Connection and Isolation Panel [NHANES]    Frequency of Communication with Friends and Family: More than three times a week    Frequency of Social Gatherings with Friends and Family: Three times a week    Attends Religious Services: Never    Active Member of Clubs or Organizations: Yes    Attends Archivist Meetings: Never    Marital Status: Living with partner  Intimate Partner Violence: Not At Risk (06/02/2021)   Humiliation, Afraid, Rape, and Kick questionnaire    Fear of Current or Ex-Partner: No    Emotionally Abused: No    Physically Abused: No    Sexually Abused: No    Family History  Problem Relation Age of Onset   Brain cancer Mother    Heart disease Father     Review of Systems:  As stated in the HPI and otherwise negative.   BP 122/60   Pulse (!) 102   Ht 5\' 9"  (1.753 m)   Wt 71.5 kg   SpO2 92% Comment: On 3L of oxygen  BMI 23.27 kg/m   Physical Examination:  General: Well developed, well nourished, NAD  HEENT: OP clear, mucus membranes moist  SKIN: warm, dry. No rashes. Neuro: No focal deficits  Musculoskeletal: Muscle strength 5/5 all ext  Psychiatric: Mood and affect normal  Neck: No JVD, no carotid bruits, no thyromegaly, no lymphadenopathy.  Lungs:Clear bilaterally, no wheezes, rhonci, crackles Cardiovascular: Regular rate and rhythm. No murmurs, gallops or rubs. Abdomen:Soft. Bowel sounds present. Non-tender.  Extremities: No lower extremity edema. Pulses are 2 + in the bilateral DP/PT.  Echo 04/19/20:  1. Left ventricular ejection fraction, by estimation, is 65 to 70%. The  left ventricle has normal function. The left ventricle has no regional  wall motion abnormalities. Left ventricular  diastolic parameters were  normal.   2. Right  ventricular systolic function is normal. The right ventricular  size is normal. There is normal pulmonary artery systolic pressure.   3. The mitral valve is normal in structure. No evidence of mitral valve  regurgitation. No evidence of mitral stenosis.   4. The aortic valve is tricuspid. Aortic valve regurgitation is not  visualized. No aortic stenosis is present.   5. The inferior vena cava is normal in size with greater than 50%  respiratory variability, suggesting right atrial pressure of 3 mmHg.   Echo, Cath reports from [facility] are in Care Everywhere  EKG:  EKG is ordered today. The ekg ordered today demonstrates Sinus tachycardia rate 102 bpm  Recent Labs: 07/28/2022: Magnesium 1.7 10/14/2022: ALT 23; BUN 16; Creatinine, Ser 1.10; Hemoglobin 11.8; Platelets 245; Potassium 4.6; Sodium 142; TSH 2.650   Lipid Panel    Component Value Date/Time   CHOL 159 10/14/2022 1347   TRIG 55 10/14/2022 1347   TRIG 82 08/15/2006 0929   HDL 76 10/14/2022 1347   CHOLHDL 2.1 10/14/2022 1347   CHOLHDL 2.3 10/10/2020 0845   VLDL 12 10/10/2020 0845   LDLCALC 72 10/14/2022 1347     Wt Readings from Last 3 Encounters:  11/29/22 71.5 kg  10/14/22 69.9 kg  10/04/22 69.1 kg    Assessment and Plan:   1. CAD without angina: Cath at [facility] in July 2022 during workup for possible lung transplant. Moderate mid LAD stenosis with patent LAD and Circumflex stents. LV function normal by echo July 2022 at [facility] with no valve disease. He does not tolerate beta blockers due to fatigue. He is now DNR and palliative care is following. No chest pain suggestive of angina. Continue ASA and statin.      2. Tobacco abuse, in remission: He is no longer smoking  3. HTN: BP is controlled. No changes today  4. Hyperlipidemia: LDL near goal in June 2023. Continue statin. Will not repeat lipids given his poor prognosis with advanced lung disease.   5. Chronic diastolic CHF: LV function normal by echo July 2022. No volume  overload on exam. Weight is stable. Continue Lasix.   6. Dyspnea/COPD: He is followed in the pulmonary office. Now on continuous supplemental O2. Not a candidate for lung transplant. He is now DNR and palliative care is following  7. AAA: Large 6.9 cm AAA on CTA at [facility] in 2022. He was seen by vascular surgery there but not felt to be a candidate for intervention. He was admitted to [facility] in November 2023 with rupture of his AAA and had endovascular repair per Dr. Unk Lightning.   Labs/ tests ordered today include:   Orders Placed This Encounter  Procedures   EKG 12-Lead   Disposition:   F/U with me in 6 months  Signed, Lauree Chandler, MD 11/29/2022 9:53 AM    [facility] [address], [place], [place]  [address] Phone: [phone]; Fax: [phone]

## 2022-11-29 ENCOUNTER — Ambulatory Visit: Payer: Medicare Other | Attending: Cardiovascular Disease | Admitting: Cardiovascular Disease

## 2022-11-29 ENCOUNTER — Encounter: Payer: Self-pay | Admitting: Cardiovascular Disease

## 2022-11-29 VITALS — BP 122/60 | HR 102 | Ht 69.0 in | Wt 157.6 lb

## 2022-11-29 DIAGNOSIS — I2510 Atherosclerotic heart disease of native coronary artery without angina pectoris: Secondary | ICD-10-CM | POA: Diagnosis not present

## 2022-11-29 DIAGNOSIS — I5032 Chronic diastolic (congestive) heart failure: Secondary | ICD-10-CM

## 2022-11-29 DIAGNOSIS — I7143 Infrarenal abdominal aortic aneurysm, without rupture: Secondary | ICD-10-CM | POA: Diagnosis not present

## 2022-11-29 DIAGNOSIS — I10 Essential (primary) hypertension: Secondary | ICD-10-CM

## 2022-11-29 DIAGNOSIS — E7800 Pure hypercholesterolemia, unspecified: Secondary | ICD-10-CM

## 2022-11-29 MED ORDER — ATORVASTATIN CALCIUM 40 MG PO TABS: 40.0000 mg | ORAL_TABLET | Freq: Every day | ORAL | 3 refills | Status: DC

## 2022-11-29 MED ORDER — FUROSEMIDE 40 MG PO TABS: 40.0000 mg | ORAL_TABLET | Freq: Every day | ORAL | 3 refills | Status: DC

## 2022-11-29 MED ORDER — NITROGLYCERIN 0.4 MG SL SUBL: 0.4000 mg | SUBLINGUAL_TABLET | SUBLINGUAL | 6 refills | Status: DC | PRN

## 2022-11-29 NOTE — Patient Instructions (Signed)
Medication Instructions:  No chanages *If you need a refill on your cardiac medications before your next appointment, please call your pharmacy*   Lab Work: none If you have labs (blood work) drawn today and your tests are completely normal, you will receive your results only by: [place] (if you have MyChart) OR A paper copy in the mail If you have any lab test that is abnormal or we need to change your treatment, we will call you to review the results.   Testing/Procedures: none   Follow-Up: At [facility], you and your health needs are our priority.  As part of our continuing mission to provide you with exceptional heart care, we have created designated Provider Care Teams.  These Care Teams include your primary Cardiologist (physician) and Advanced Practice Providers (APPs -  Physician Assistants and Nurse Practitioners) who all work together to provide you with the care you need, when you need it.    Your next appointment:   6 month(s)  Provider:   Lauree Chandler, MD

## 2022-12-11 ENCOUNTER — Encounter: Payer: Self-pay | Admitting: Pulmonary Disease

## 2022-12-11 ENCOUNTER — Ambulatory Visit: Payer: Medicare Other | Admitting: Pulmonary Disease

## 2022-12-11 VITALS — BP 128/82 | HR 102 | Ht 69.0 in | Wt 152.4 lb

## 2022-12-11 DIAGNOSIS — J9612 Chronic respiratory failure with hypercapnia: Secondary | ICD-10-CM

## 2022-12-11 DIAGNOSIS — J432 Centrilobular emphysema: Secondary | ICD-10-CM

## 2022-12-11 DIAGNOSIS — J310 Chronic rhinitis: Secondary | ICD-10-CM

## 2022-12-11 DIAGNOSIS — R911 Solitary pulmonary nodule: Secondary | ICD-10-CM | POA: Diagnosis not present

## 2022-12-11 DIAGNOSIS — J9611 Chronic respiratory failure with hypoxia: Secondary | ICD-10-CM | POA: Diagnosis not present

## 2022-12-11 MED ORDER — FLUTICASONE PROPIONATE 50 MCG/ACT NA SUSP: 1.0000 | Freq: Every day | NASAL | 2 refills | Status: DC

## 2022-12-11 NOTE — Progress Notes (Signed)
[facility]  Chief Complaint  Patient presents with   Follow-up   Past Surgical History:  He  has a past surgical history that includes Finger surgery (Left); Vasectomy; Coronary angioplasty with stent (09/17/2010; 09/28/2014); Cardiac catheterization (~ 2012); Nasal endoscopy with epistaxis control (Bilateral, 03/2012); left heart catheterization with coronary angiogram (N/A, 09/28/2014); Abdominal aortic endovascular stent graft (N/A, 07/28/2022); and Ultrasound guidance for vascular access (N/A, 07/28/2022).  Past Medical History:  PNA, HTN, HLD, CAD, Seizure, ETOH, infrarenal abdominal aortic aneurysm  Constitutional:  BP 128/82   Pulse (!) 102   Ht 5\' 9"  (1.753 m)   Wt 152 lb 6.4 oz (69.1 kg)   SpO2 94% Comment: 3LO2 cont  BMI 22.51 kg/m   Brief Summary:  John [name] is a 67 y.o. 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      Subjective:   He is here with his wife.  He saw a news segment about endobronchial valves for emphysema.  He gets winded more easily.  Has congestion in his sinuses, especially at night.  Mostly on left side.  He gets a cough and chest congestion.  It is hard for him to bring the phlegm up.  He has been using mucinex.  Physical Exam:   Appearance - well kempt, wearing oxygen, sitting in a scooter   ENMT - no sinus tenderness, no oral exudate, no LAN, Mallampati 2 airway, no stridor  Respiratory - decreased breath sounds bilaterally, no wheezing or rales  CV - s1s2 regular rate and rhythm, no murmurs  Ext - no clubbing, no edema  Skin - no rashes  Psych - normal mood and affect         Pulmonary testing:  PFT 12/09/19 >> FEV1 .059 (16%), FEV1% 37, DLCO 27% A1AT 03/09/20 >> 110, MZ ABG 02/12/21 >> pH 7.28, PCO2 80.9, PO2 109 on 40% FiO2 PFT 04/11/21 >> FEV1 0.6 (18%), FEV1% 26, DLCO 21%  Chest  Imaging:  CT angio chest 11/23/19 >> moderate centrilobular emphysema, mild infrahilar BTX, 6 mm nodule LUL CT chest 01/02/21 >> no change LUL nodule, new 1.1 cm LLL opacity CT chest 03/28/21 >> severe emphysema, 2.2 x 0.8 cm band like opacity RLL from ATX, mucus plugging, 3 mm nodule RUL, 1.6 x 0.7 cm nodularity LUL CT chest 04/09/21 >> diffuse centrilobular emphysema, stable nodule LUL, 8 mm nodule RLL CT chest 03/27/22 >> stable GGO LLL  Cardiac Tests:  Echo 04/19/20 >> EF 65 to 70%  Social History:  He  reports that he quit smoking about 2 years ago. His smoking use included cigarettes. He started smoking about 54 years ago. He has a 100.00 pack-year smoking history. He has never used smokeless tobacco. He reports current alcohol use of about 144.0 standard drinks of alcohol per week. He reports that he does not use drugs.  Family History:  His family history includes Brain cancer in his mother; Heart disease in his father.     Assessment/Plan:   Severe COPD from emphysema. - he had assessment at [facility] in July 2022 and informed he wasn't a candidate for lung transplant due to coronary artery disease and infrarenal abdominal aortic aneurysm - discussed options of endobronchial valve >> will see how his follow up CT chest in June looks and then decide if he would be a candidate for endobronchial valve - continue breztri with spacer - prn albuterol, mucinex -  he has a nebulizer; he has albuterol nebulizer medicine sent to [facility] - will arrange for flutter valve  Chronic respiratory failure with hypoxia and hypercapnia. - he uses [employer] for his DME - goal SpO2 > 90% - 3 to 4 liters oxygen 24/7 - NIV at night and prn during the day  Lung nodule. - he would like to continue radiographic monitoring - f/u CT chest without contrast in June 2024  Chronic rhinitis. - previously seen by Dr. Benjamine Mola >> will arrange for referral back to Dr. Benjamine Mola - he will also try flonase at  night  Insomnia, anxiety. - xanax 0.25 mg tid prn  Coronary artery disease. - he is followed by Dr. Darlina Guys with [facility]  Infrarenal abdominal aortic aneurysm. - s/p endovascular repair - follow up with Dr. Orlie Pollen with vascular surgery  Goals of care. - he was clear he would not want intubation or cardiac resuscitation in the event of cardiac arrest - he is okay to follow up CT imaging, and continue NIMV and supplemental oxygen - he is concerned about dying in the hospital - followed by palliative care  Time Spent Involved in Patient Care on Day of Examination:  27 minutes  Follow up:   Patient Instructions  Flonase 1 spray in each nostril nightly  Will arrange for referral to Dr. Benjamine Mola with ENT  CT chest in June 2024 and follow up after this  Medication List:   Allergies as of 12/11/2022       Reactions   Penicillins Other (See Comments)   Passes out        Medication List        Accurate as of December 11, 2022 10:30 AM. If you have any questions, ask your nurse or doctor.          albuterol 108 (90 Base) MCG/ACT inhaler Commonly known as: ProAir HFA Inhale 2 puffs into the lungs every 4 (four) hours as needed for wheezing or shortness of breath. 2 puffs every 4 hours as needed only  if your can't catch your breath   albuterol (2.5 MG/3ML) 0.083% nebulizer solution Commonly known as: PROVENTIL Take 3 mLs (2.5 mg total) by nebulization every 4 (four) hours as needed for wheezing or shortness of breath.   ALPRAZolam 0.25 MG tablet Commonly known as: XANAX Take 1 tablet (0.25 mg total) by mouth 3 (three) times daily as needed for anxiety.   aspirin EC 81 MG tablet Take 81 mg by mouth daily.   atorvastatin 40 MG tablet Commonly known as: LIPITOR Take 1 tablet (40 mg total) by mouth daily.   Breztri Aerosphere 160-9-4.8 MCG/ACT Aero Generic drug: Budeson-Glycopyrrol-Formoterol Inhale 2 puffs into the lungs in the morning and at  bedtime.   fluticasone 50 MCG/ACT nasal spray Commonly known as: FLONASE Place 1 spray into both nostrils daily. Started by: Chesley Mires, MD   furosemide 40 MG tablet Commonly known as: LASIX Take 1 tablet (40 mg total) by mouth daily.   guaiFENesin 600 MG 12 hr tablet Commonly known as: Mucinex Take 2 tablets (1,200 mg total) by mouth 2 (two) times daily as needed for cough or to loosen phlegm.   multivitamin with minerals Tabs tablet Take 1 tablet by mouth daily.   nitroGLYCERIN 0.4 MG SL tablet Commonly known as: Nitrostat Place 1 tablet (0.4 mg total) under the tongue every 5 (five) minutes as needed for chest pain.   OXYGEN Inhale 2-3 L into the lungs.  Signature:  Chesley Mires, MD [place] Pager - [phone] 12/11/2022, 10:30 AM

## 2022-12-11 NOTE — Patient Instructions (Signed)
Flonase 1 spray in each nostril nightly  Will arrange for referral to Dr. Benjamine Mola with ENT  CT chest in June 2024 and follow up after this

## 2022-12-18 DIAGNOSIS — J449 Chronic obstructive pulmonary disease, unspecified: Secondary | ICD-10-CM | POA: Diagnosis not present

## 2022-12-18 DIAGNOSIS — J9610 Chronic respiratory failure, unspecified whether with hypoxia or hypercapnia: Secondary | ICD-10-CM | POA: Diagnosis not present

## 2022-12-25 DIAGNOSIS — J9611 Chronic respiratory failure with hypoxia: Secondary | ICD-10-CM | POA: Diagnosis not present

## 2023-01-06 DIAGNOSIS — H9042 Sensorineural hearing loss, unilateral, left ear, with unrestricted hearing on the contralateral side: Secondary | ICD-10-CM | POA: Diagnosis not present

## 2023-01-06 DIAGNOSIS — J310 Chronic rhinitis: Secondary | ICD-10-CM | POA: Diagnosis not present

## 2023-01-06 DIAGNOSIS — H9312 Tinnitus, left ear: Secondary | ICD-10-CM | POA: Diagnosis not present

## 2023-01-06 DIAGNOSIS — J343 Hypertrophy of nasal turbinates: Secondary | ICD-10-CM | POA: Diagnosis not present

## 2023-01-13 ENCOUNTER — Other Ambulatory Visit: Payer: Self-pay | Admitting: Otolaryngology

## 2023-01-13 DIAGNOSIS — H9190 Unspecified hearing loss, unspecified ear: Secondary | ICD-10-CM

## 2023-01-15 DIAGNOSIS — K080 Exfoliation of teeth due to systemic causes: Secondary | ICD-10-CM | POA: Diagnosis not present

## 2023-01-17 ENCOUNTER — Other Ambulatory Visit: Payer: Self-pay

## 2023-01-17 MED ORDER — BREZTRI AEROSPHERE 160-9-4.8 MCG/ACT IN AERO: 2.0000 | INHALATION_SPRAY | Freq: Two times a day (BID) | RESPIRATORY_TRACT | 11 refills | Status: DC

## 2023-01-18 DIAGNOSIS — J9610 Chronic respiratory failure, unspecified whether with hypoxia or hypercapnia: Secondary | ICD-10-CM | POA: Diagnosis not present

## 2023-01-18 DIAGNOSIS — J449 Chronic obstructive pulmonary disease, unspecified: Secondary | ICD-10-CM | POA: Diagnosis not present

## 2023-01-23 DIAGNOSIS — K080 Exfoliation of teeth due to systemic causes: Secondary | ICD-10-CM | POA: Diagnosis not present

## 2023-01-24 DIAGNOSIS — J9611 Chronic respiratory failure with hypoxia: Secondary | ICD-10-CM | POA: Diagnosis not present

## 2023-02-13 ENCOUNTER — Encounter: Payer: Self-pay | Admitting: Otolaryngology

## 2023-02-14 ENCOUNTER — Ambulatory Visit: Payer: Medicare Other | Admitting: Vascular Surgery

## 2023-02-14 ENCOUNTER — Other Ambulatory Visit (HOSPITAL_COMMUNITY): Payer: Medicare Other

## 2023-02-15 ENCOUNTER — Ambulatory Visit
Admission: RE | Admit: 2023-02-15 | Discharge: 2023-02-15 | Disposition: A | Discharge status: Home / Self Care | Payer: Medicare Other | Source: Ambulatory Visit | Attending: Otolaryngology | Admitting: Otolaryngology

## 2023-02-15 DIAGNOSIS — H9190 Unspecified hearing loss, unspecified ear: Secondary | ICD-10-CM

## 2023-02-15 MED ORDER — GADOPICLENOL 0.5 MMOL/ML IV SOLN
7.0000 mL | Freq: Once | INTRAVENOUS | Status: AC | PRN
  Administered 2023-02-15: 7 mL via INTRAVENOUS

## 2023-02-17 DIAGNOSIS — J449 Chronic obstructive pulmonary disease, unspecified: Secondary | ICD-10-CM | POA: Diagnosis not present

## 2023-02-17 DIAGNOSIS — J9610 Chronic respiratory failure, unspecified whether with hypoxia or hypercapnia: Secondary | ICD-10-CM | POA: Diagnosis not present

## 2023-02-19 DIAGNOSIS — J343 Hypertrophy of nasal turbinates: Secondary | ICD-10-CM | POA: Diagnosis not present

## 2023-02-19 DIAGNOSIS — J310 Chronic rhinitis: Secondary | ICD-10-CM | POA: Diagnosis not present

## 2023-02-19 DIAGNOSIS — H9312 Tinnitus, left ear: Secondary | ICD-10-CM | POA: Diagnosis not present

## 2023-02-19 DIAGNOSIS — H9042 Sensorineural hearing loss, unilateral, left ear, with unrestricted hearing on the contralateral side: Secondary | ICD-10-CM | POA: Diagnosis not present

## 2023-02-24 DIAGNOSIS — J9611 Chronic respiratory failure with hypoxia: Secondary | ICD-10-CM | POA: Diagnosis not present

## 2023-02-25 DIAGNOSIS — H5203 Hypermetropia, bilateral: Secondary | ICD-10-CM | POA: Diagnosis not present

## 2023-02-27 NOTE — Progress Notes (Signed)
Office Note    HPI: John [name] is a 67 y.o. (20-Nov-1955) male presenting status post EVAR for rupture AAA.  On exam today, John [name] was doing well, accompanied by his wife.  No major changes since seen last. He requires 3 to 4 L of oxygen at baseline, and was riding his electric scooter.  Overall he continues well.  He denies abdominal pain.  He continues to have mild left groin pain to palpation, however has not appreciated any swelling or abnormal pulsation.  Denies bilateral lower extremity claudication. Normal bowel movements, normal urination   Past Medical History:  Diagnosis Date   Alcohol abuse 12/27/2010   Allergic rhinitis, cause unspecified 12/27/2010   Anal warts 04/11/2012   CAD, NATIVE VESSEL 10/04/2010   COPD (chronic obstructive pulmonary disease) (HCC)    "a touch" (09/28/2013)   History of blood transfusion 03/2012   related to nose bleed   HYPERLIPIDEMIA-MIXED 10/04/2010   HYPERTENSION, BENIGN 10/04/2010   Myocardial infarction (HCC) 09/16/2010   Pneumonia    "when I was a kid"   Stented coronary artery    Mid LAD    TOBACCO ABUSE 10/04/2010    Past Surgical History:  Procedure Laterality Date   ABDOMINAL AORTIC ENDOVASCULAR STENT GRAFT N/A 07/28/2022   Procedure: ABDOMINAL AORTIC ENDOVASCULAR STENT GRAFT;  Surgeon: Victorino Sparrow, MD;  Location: [facility];  Service: Vascular;  Laterality: N/A;   CARDIAC CATHETERIZATION  ~ 2012   CORONARY ANGIOPLASTY WITH STENT PLACEMENT  09/17/2010; 09/28/2014   "1; 2"   FINGER SURGERY Left    "almost cut off" tip of 2nd digit   LEFT HEART CATHETERIZATION WITH CORONARY ANGIOGRAM N/A 09/28/2014   Procedure: LEFT HEART CATHETERIZATION WITH CORONARY ANGIOGRAM;  Surgeon: Kathleene Hazel, MD;  Location: [facility] CATH LAB;  Service: Cardiovascular;  Laterality: N/A;   NASAL ENDOSCOPY WITH EPISTAXIS CONTROL Bilateral 03/2012   ULTRASOUND GUIDANCE FOR VASCULAR ACCESS N/A 07/28/2022   Procedure: ULTRASOUND GUIDANCE FOR VASCULAR ACCESS;   Surgeon: Victorino Sparrow, MD;  Location: [facility] OR;  Service: Vascular;  Laterality: N/A;   VASECTOMY      Social History   Socioeconomic History   Marital status: Single    Spouse name: Not on file   Number of children: Not on file   Years of education: Not on file   Highest education level: Not on file  Occupational History   Occupation: Lobbyist maintenance work     Employer: [employer]    Comment: Ronette Deter  Tobacco Use   Smoking status: Former    Packs/day: 2.00    Years: 50.00    Additional pack years: 0.00    Total pack years: 100.00    Types: Cigarettes    Start date: 58    Quit date: 04/18/2020    Years since quitting: 2.8   Smokeless tobacco: Never   Tobacco comments:    2 plus packs per day. He has a 100 + pack - year history of tobacco abuse currently. Former 4 ppd for 25 years.  Vaping Use   Vaping Use: Never used  Substance and Sexual Activity   Alcohol use: Yes    Alcohol/week: 144.0 standard drinks of alcohol    Types: 144 Cans of beer per week    Comment:  history 12 pack of beer per night".  2021- drinks 3 beer/day   Drug use: No   Sexual activity: Yes    Birth control/protection: None  Other Topics Concern   Not on  file  Social History Narrative   The patient lives in [place] with his girlfriend. 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   Social Determinants of Health   Financial Resource Strain: Low Risk  (08/08/2022)   Overall Financial Resource Strain (CARDIA)    Difficulty of Paying Living Expenses: Not hard at all  Food Insecurity: No Food Insecurity (08/08/2022)   Hunger Vital Sign    Worried About Running Out of Food in the Last Year: Never true    Ran Out of Food in the Last Year: Never true  Transportation Needs: No Transportation Needs (08/08/2022)   PRAPARE - Administrator, Civil Service (Medical): No    Lack of Transportation (Non-Medical): No  Physical Activity: Inactive (06/02/2021)    Exercise Vital Sign    Days of Exercise per Week: 0 days    Minutes of Exercise per Session: 0 min  Stress: No Stress Concern Present (08/08/2022)   [employer] of Occupational Health - Occupational Stress Questionnaire    Feeling of Stress : Not at all  Social Connections: Moderately Integrated (08/08/2022)   Social Connection and Isolation Panel [NHANES]    Frequency of Communication with Friends and Family: More than three times a week    Frequency of Social Gatherings with Friends and Family: Three times a week    Attends Religious Services: Never    Active Member of Clubs or Organizations: Yes    Attends Banker Meetings: Never    Marital Status: Living with partner  Intimate Partner Violence: Not At Risk (06/02/2021)   Humiliation, Afraid, Rape, and Kick questionnaire    Fear of Current or Ex-Partner: No    Emotionally Abused: No    Physically Abused: No    Sexually Abused: No   Family History  Problem Relation Age of Onset   Brain cancer Mother    Heart disease Father     Current Outpatient Medications  Medication Sig Dispense Refill   albuterol (PROAIR HFA) 108 (90 Base) MCG/ACT inhaler Inhale 2 puffs into the lungs every 4 (four) hours as needed for wheezing or shortness of breath. 2 puffs every 4 hours as needed only  if your can't catch your breath 8 g 5   albuterol (PROVENTIL) (2.5 MG/3ML) 0.083% nebulizer solution Take 3 mLs (2.5 mg total) by nebulization every 4 (four) hours as needed for wheezing or shortness of breath. 360 mL 5   ALPRAZolam (XANAX) 0.25 MG tablet Take 1 tablet (0.25 mg total) by mouth 3 (three) times daily as needed for anxiety. 60 tablet 5   aspirin EC 81 MG tablet Take 81 mg by mouth daily.     atorvastatin (LIPITOR) 40 MG tablet Take 1 tablet (40 mg total) by mouth daily. 90 tablet 3   Budeson-Glycopyrrol-Formoterol (BREZTRI AEROSPHERE) 160-9-4.8 MCG/ACT AERO Inhale 2 puffs into the lungs in the morning and at bedtime. 10.7 g 11    fluticasone (FLONASE) 50 MCG/ACT nasal spray Place 1 spray into both nostrils daily. 16 g 2   furosemide (LASIX) 40 MG tablet Take 1 tablet (40 mg total) by mouth daily. 90 tablet 3   guaiFENesin (MUCINEX) 600 MG 12 hr tablet Take 2 tablets (1,200 mg total) by mouth 2 (two) times daily as needed for cough or to loosen phlegm.     Multiple Vitamin (MULTIVITAMIN WITH MINERALS) TABS tablet Take 1 tablet by mouth daily.     nitroGLYCERIN (NITROSTAT) 0.4 MG SL  tablet Place 1 tablet (0.4 mg total) under the tongue every 5 (five) minutes as needed for chest pain. 25 tablet 6   OXYGEN Inhale 2-3 L into the lungs.     No current facility-administered medications for this visit.    Allergies  Allergen Reactions   Penicillins Other (See Comments)    Passes out     REVIEW OF SYSTEMS:  [X]  denotes positive finding, [ ]  denotes negative finding Cardiac  Comments:  Chest pain or chest pressure:    Shortness of breath upon exertion:    Short of breath when lying flat:    Irregular heart rhythm:        Vascular    Pain in calf, thigh, or hip brought on by ambulation:    Pain in feet at night that wakes you up from your sleep:     Blood clot in your veins:    Leg swelling:         Pulmonary    Oxygen at home:    Productive cough:     Wheezing:         Neurologic    Sudden weakness in arms or legs:     Sudden numbness in arms or legs:     Sudden onset of difficulty speaking or slurred speech:    Temporary loss of vision in one eye:     Problems with dizziness:         Gastrointestinal    Blood in stool:     Vomited blood:         Genitourinary    Burning when urinating:     Blood in urine:        Psychiatric    Major depression:         Hematologic    Bleeding problems:    Problems with blood clotting too easily:        Skin    Rashes or ulcers:        Constitutional    Fever or chills:      PHYSICAL EXAMINATION:  There were no vitals filed for this  visit.   General:  WDWN in NAD; vital signs documented above Gait: Not observed HENT: WNL, normocephalic Pulmonary: O2 dependent, nonlabored Cardiac: regular HR Abdomen: soft, NT, no masses Skin: without rashes Vascular Exam/Pulses:  Right Left  Radial 2+ (normal) 2+ (normal)  Ulnar    Femoral    Popliteal    DP 2+ (normal) 2+ (normal)  PT     Extremities: without ischemic changes, without Gangrene , without cellulitis; without open wounds;  Musculoskeletal: no muscle wasting or atrophy  Neurologic: A&O X 3;  No focal weakness or paresthesias are detected Psychiatric:  The pt has Normal affect.   Non-Invasive Vascular Imaging:   Endovascular Aortic Repair Study (EVAR)  Patient Name:  DONTERRIUS RECINE  Date of Exam:   02/28/2023 Medical Rec #: [id number]           Accession #:    [id number] Date of Birth: 10-19-1955          Patient Gender: M Patient Age:   16 years Exam Location:  [facility] Procedure:      VAS [place] EVAR DUPLEX Referring Phys: Ivin Booty Kanda Deluna   --------------------------------------------------------------------------- -----   Indications: Surgery date 07/28/22: EVAR.  Limitations: Air/bowel gas.    Comparison Study: Post op CT 08/19/22: 9.7 cm with no endoleak  Performing Technologist: Thereasa Parkin RVT    Examination Guidelines:  A complete evaluation includes B-mode imaging, spectral Doppler, color Doppler, and power Doppler as needed of all accessible portions of each vessel. Bilateral testing is considered an integral part of a complete examination. Limited examinations for reoccurring indications may be performed as noted.    Endovascular Aortic Repair (EVAR): +----------+----------------+-------------------+-------------------+           Diameter AP (cm)Diameter Trans (cm)Velocities (cm/sec) +----------+----------------+-------------------+-------------------+ Aorta     6.26            6.84               64                   +----------+----------------+-------------------+-------------------+ Right Limb1.68            1.46               48                  +----------+----------------+-------------------+-------------------+ Left Limb 1.51            1.55               48                  +----------+----------------+-------------------+-------------------+      ASSESSMENT/PLAN: NED John [name] is a 67 y.o. male presenting status post endovascular aortic repair for ruptured AAA on 07/28/22.  Inpatient follow-up CT was deferred due to CKD.  Recent ultrasound shows significant decrease in aortic sac size. No endoleak.  I asked that he continue with a daily aspirin and high intensity statin.  My plan is to see him in 12 months for follow-up duplex ultrasound of the aorta.  He was asked to call my office should any questions or concerns arise.   Victorino Sparrow, MD [facility] [phone] Total time of patient care including pre-visit research, consultation, and documentation greater than 20 minutes

## 2023-02-28 ENCOUNTER — Ambulatory Visit: Payer: Medicare Other | Admitting: Vascular Surgery

## 2023-02-28 ENCOUNTER — Encounter: Payer: Self-pay | Admitting: Vascular Surgery

## 2023-02-28 ENCOUNTER — Ambulatory Visit (HOSPITAL_COMMUNITY)
Admission: RE | Admit: 2023-02-28 | Discharge: 2023-02-28 | Disposition: A | Discharge status: Home / Self Care | Payer: Medicare Other | Source: Ambulatory Visit | Attending: Vascular Surgery | Admitting: Vascular Surgery

## 2023-02-28 VITALS — BP 128/80 | HR 83 | Temp 98.4°F | Resp 20 | Ht 69.0 in | Wt 152.0 lb

## 2023-02-28 DIAGNOSIS — Z9889 Other specified postprocedural states: Secondary | ICD-10-CM | POA: Insufficient documentation

## 2023-02-28 DIAGNOSIS — Z8679 Personal history of other diseases of the circulatory system: Secondary | ICD-10-CM | POA: Diagnosis not present

## 2023-02-28 DIAGNOSIS — I713 Abdominal aortic aneurysm, ruptured, unspecified: Secondary | ICD-10-CM

## 2023-03-07 DIAGNOSIS — J449 Chronic obstructive pulmonary disease, unspecified: Secondary | ICD-10-CM | POA: Diagnosis not present

## 2023-03-07 DIAGNOSIS — Z515 Encounter for palliative care: Secondary | ICD-10-CM | POA: Diagnosis not present

## 2023-03-12 ENCOUNTER — Other Ambulatory Visit: Payer: Self-pay

## 2023-03-12 DIAGNOSIS — I713 Abdominal aortic aneurysm, ruptured, unspecified: Secondary | ICD-10-CM

## 2023-03-12 DIAGNOSIS — Z9889 Other specified postprocedural states: Secondary | ICD-10-CM

## 2023-03-13 ENCOUNTER — Ambulatory Visit (HOSPITAL_COMMUNITY)
Admission: RE | Admit: 2023-03-13 | Discharge: 2023-03-13 | Disposition: A | Discharge status: Home / Self Care | Payer: Medicare Other | Source: Ambulatory Visit | Attending: Pulmonary Disease | Admitting: Pulmonary Disease

## 2023-03-13 DIAGNOSIS — R911 Solitary pulmonary nodule: Secondary | ICD-10-CM | POA: Diagnosis not present

## 2023-03-13 DIAGNOSIS — R918 Other nonspecific abnormal finding of lung field: Secondary | ICD-10-CM | POA: Diagnosis not present

## 2023-03-13 DIAGNOSIS — J432 Centrilobular emphysema: Secondary | ICD-10-CM | POA: Diagnosis not present

## 2023-03-20 DIAGNOSIS — J9610 Chronic respiratory failure, unspecified whether with hypoxia or hypercapnia: Secondary | ICD-10-CM | POA: Diagnosis not present

## 2023-03-20 DIAGNOSIS — J449 Chronic obstructive pulmonary disease, unspecified: Secondary | ICD-10-CM | POA: Diagnosis not present

## 2023-03-26 DIAGNOSIS — J9611 Chronic respiratory failure with hypoxia: Secondary | ICD-10-CM | POA: Diagnosis not present

## 2023-04-04 ENCOUNTER — Ambulatory Visit: Payer: Medicare Other | Admitting: Pulmonary Disease

## 2023-04-04 ENCOUNTER — Encounter: Payer: Self-pay | Admitting: Pulmonary Disease

## 2023-04-04 VITALS — BP 112/70 | HR 105 | Ht 69.0 in | Wt 154.0 lb

## 2023-04-04 DIAGNOSIS — J9611 Chronic respiratory failure with hypoxia: Secondary | ICD-10-CM

## 2023-04-04 DIAGNOSIS — J432 Centrilobular emphysema: Secondary | ICD-10-CM

## 2023-04-04 DIAGNOSIS — J9612 Chronic respiratory failure with hypercapnia: Secondary | ICD-10-CM | POA: Diagnosis not present

## 2023-04-04 DIAGNOSIS — R911 Solitary pulmonary nodule: Secondary | ICD-10-CM | POA: Diagnosis not present

## 2023-04-04 MED ORDER — ALPRAZOLAM 0.25 MG PO TABS: 0.2500 mg | ORAL_TABLET | Freq: Three times a day (TID) | ORAL | 5 refills | Status: DC | PRN

## 2023-04-04 MED ORDER — AZELASTINE HCL 0.1 % NA SOLN: 1.0000 | Freq: Two times a day (BID) | NASAL | 12 refills | Status: DC

## 2023-04-04 NOTE — Progress Notes (Signed)
[facility]  Chief Complaint  Patient presents with   Follow-up    Breathing is about the same. He is not coughing much at all. He is using his albuterol neb 1-2 x per day. Denies any new co's.    Past Surgical History:  He  has a past surgical history that includes Finger surgery (Left); Vasectomy; Coronary angioplasty with stent (09/17/2010; 09/28/2014); Cardiac catheterization (~ 2012); Nasal endoscopy with epistaxis control (Bilateral, 03/2012); left heart catheterization with coronary angiogram (N/A, 09/28/2014); Abdominal aortic endovascular stent graft (N/A, 07/28/2022); and Ultrasound guidance for vascular access (N/A, 07/28/2022).  Past Medical History:  PNA, HTN, HLD, CAD, Seizure, ETOH, infrarenal abdominal aortic aneurysm  Constitutional:  BP 112/70 (BP Location: Left Arm, Cuff Size: Normal)   Pulse (!) 105   Ht 5\' 9"  (1.753 m)   Wt 154 lb (69.9 kg)   SpO2 97% Comment: 4lpm cont. 02  BMI 22.74 kg/m   Brief Summary:  John [name] is a 67 y.o. 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      Subjective:   He is here with his wife.  He has been getting more congestion in his sinuses.  Still has ringing in his ears.  Gets drainage also.  Has been getting episodes of vertigo when he moves his head in certain directions.  Not having much cough or chest congestion.  Gets winded with walking.  Skin feels more dry.  His CT chest showed stable LLL nodule.  No other significant changes.  Physical Exam:   Appearance - well kempt, wearing oxygen, sitting in a scooter   ENMT - no sinus tenderness, no oral exudate, no LAN, Mallampati 2 airway, no stridor  Respiratory - decreased breath sounds bilaterally, no wheezing or rales  CV - s1s2 regular rate and rhythm, no murmurs  Ext - no clubbing, no edema  Skin - no rashes  Psych - normal mood  and affect         Pulmonary testing:  PFT 12/09/19 >> FEV1 .059 (16%), FEV1% 37, DLCO 27% A1AT 03/09/20 >> 110, MZ ABG 02/12/21 >> pH 7.28, PCO2 80.9, PO2 109 on 40% FiO2 PFT 04/11/21 >> FEV1 0.6 (18%), FEV1% 26, DLCO 21%  Chest Imaging:  CT angio chest 11/23/19 >> moderate centrilobular emphysema, mild infrahilar BTX, 6 mm nodule LUL CT chest 01/02/21 >> no change LUL nodule, new 1.1 cm LLL opacity CT chest 03/28/21 >> severe emphysema, 2.2 x 0.8 cm band like opacity RLL from ATX, mucus plugging, 3 mm nodule RUL, 1.6 x 0.7 cm nodularity LUL CT chest 04/09/21 >> diffuse centrilobular emphysema, stable nodule LUL, 8 mm nodule RLL CT chest 03/27/22 >> stable GGO LLL CT chest 03/17/23 >> stable GGO LLL  Cardiac Tests:  Echo 04/19/20 >> EF 65 to 70%  Social History:  He  reports that he quit smoking about 2 years ago. His smoking use included cigarettes. He started smoking about 54 years ago. He has a 103.1 pack-year smoking history. He has never used smokeless tobacco. He reports current alcohol use of about 144.0 standard drinks of alcohol per week. He reports that he does not use drugs.  Family History:  His family history includes Brain cancer in his mother; Heart disease in his father.     Assessment/Plan:   Severe COPD from emphysema. - he had assessment at [facility] in July 2022 and informed he wasn't a  candidate for lung transplant due to coronary artery disease and infrarenal abdominal aortic aneurysm - discussed option of endobronchial valve >> he wouldn't want to undergo the procedure - continue breztri with a spacer device - continue breztri with spacer - prn albuterol, mucinex, flutter valve - he has a nebulizer; he has albuterol nebulizer medicine sent to [facility]  Chronic respiratory failure with hypoxia and hypercapnia. - he uses [employer] for his DME - goal SpO2 > 90% - 3 to 4 liters oxygen 24/7 - NIV at night and prn during the day  Lung nodule. - he would like to  continue radiographic monitoring - f/u CT chest without contrast in June 2025  Chronic rhinitis with benign positional vertigo. - provide information from [employer] for habituating exercises - continue flonase - add azelastine nightly for two weeks, then prn  Insomnia, anxiety. - continue xanax 0.25 mg tid prn; this helps prevent dynamic hyperinflation when he gets anxious  Coronary artery disease. - he is followed by Dr. Earney Hamburg with [facility]  Infrarenal abdominal aortic aneurysm. - s/p endovascular repair - follow up with Dr. Gerarda Fraction with vascular surgery  Goals of care. - he was clear he would not want intubation or cardiac resuscitation in the event of cardiac arrest - he is okay to follow up CT imaging, and continue NIMV and supplemental oxygen - he is concerned about dying in the hospital - followed by palliative care  Time Spent Involved in Patient Care on Day of Examination:  35 minutes  Follow up:   Patient Instructions  Try using azelastine nasal spray nightly for two weeks, and then as needed for sinus congestion    Medication List:   Allergies as of 04/04/2023       Reactions   Penicillins Other (See Comments)   Passes out        Medication List        Accurate as of April 04, 2023  9:39 AM. If you have any questions, ask your nurse or doctor.          albuterol 108 (90 Base) MCG/ACT inhaler Commonly known as: ProAir HFA Inhale 2 puffs into the lungs every 4 (four) hours as needed for wheezing or shortness of breath. 2 puffs every 4 hours as needed only  if your can't catch your breath   albuterol (2.5 MG/3ML) 0.083% nebulizer solution Commonly known as: PROVENTIL Take 3 mLs (2.5 mg total) by nebulization every 4 (four) hours as needed for wheezing or shortness of breath.   ALPRAZolam 0.25 MG tablet Commonly known as: XANAX Take 1 tablet (0.25 mg total) by mouth 3 (three) times daily as needed for anxiety.   aspirin EC 81  MG tablet Take 81 mg by mouth daily.   atorvastatin 40 MG tablet Commonly known as: LIPITOR Take 1 tablet (40 mg total) by mouth daily.   azelastine 0.1 % nasal spray Commonly known as: ASTELIN Place 1 spray into both nostrils 2 (two) times daily. Use in each nostril as directed Started by: Caralyn Guile Aerosphere 160-9-4.8 MCG/ACT Aero Generic drug: Budeson-Glycopyrrol-Formoterol Inhale 2 puffs into the lungs in the morning and at bedtime.   fluticasone 50 MCG/ACT nasal spray Commonly known as: FLONASE Place 1 spray into both nostrils daily.   furosemide 40 MG tablet Commonly known as: LASIX Take 1 tablet (40 mg total) by mouth daily.   guaiFENesin 600 MG 12 hr tablet Commonly known as: Mucinex Take 2 tablets (1,200  mg total) by mouth 2 (two) times daily as needed for cough or to loosen phlegm.   multivitamin with minerals Tabs tablet Take 1 tablet by mouth daily.   nitroGLYCERIN 0.4 MG SL tablet Commonly known as: Nitrostat Place 1 tablet (0.4 mg total) under the tongue every 5 (five) minutes as needed for chest pain.   OXYGEN Inhale 2-3 L into the lungs.        Signature:  Coralyn Helling, MD [facility] Pager - [phone] 04/04/2023, 9:39 AM

## 2023-04-04 NOTE — Patient Instructions (Addendum)
Try using azelastine nasal spray nightly for two weeks, and then as needed for sinus congestion

## 2023-04-16 ENCOUNTER — Ambulatory Visit (INDEPENDENT_AMBULATORY_CARE_PROVIDER_SITE_OTHER): Payer: Medicare Other | Admitting: Internal Medicine

## 2023-04-16 ENCOUNTER — Encounter: Payer: Self-pay | Admitting: Internal Medicine

## 2023-04-16 VITALS — BP 121/87 | HR 98 | Ht 69.0 in | Wt 154.2 lb

## 2023-04-16 DIAGNOSIS — I739 Peripheral vascular disease, unspecified: Secondary | ICD-10-CM

## 2023-04-16 DIAGNOSIS — J9611 Chronic respiratory failure with hypoxia: Secondary | ICD-10-CM | POA: Diagnosis not present

## 2023-04-16 DIAGNOSIS — Z9889 Other specified postprocedural states: Secondary | ICD-10-CM | POA: Diagnosis not present

## 2023-04-16 DIAGNOSIS — Z8679 Personal history of other diseases of the circulatory system: Secondary | ICD-10-CM | POA: Insufficient documentation

## 2023-04-16 DIAGNOSIS — B351 Tinea unguium: Secondary | ICD-10-CM

## 2023-04-16 DIAGNOSIS — J439 Emphysema, unspecified: Secondary | ICD-10-CM | POA: Diagnosis not present

## 2023-04-16 DIAGNOSIS — F411 Generalized anxiety disorder: Secondary | ICD-10-CM

## 2023-04-16 DIAGNOSIS — J9612 Chronic respiratory failure with hypercapnia: Secondary | ICD-10-CM

## 2023-04-16 MED ORDER — TERBINAFINE HCL 250 MG PO TABS: 250.0000 mg | ORAL_TABLET | Freq: Every day | ORAL | 2 refills | Status: DC

## 2023-04-16 NOTE — Assessment & Plan Note (Addendum)
For ruptured abdominal aortic aneurysm Followed by vascular surgery Recent US of abdomen: Patent endovascular aneurysm repair with no evidence of endoleak. The largest aortic diameter has decreased compared to prior exam.

## 2023-04-16 NOTE — Assessment & Plan Note (Signed)
Takes Xanax PRN, helps with hyperinflation as well

## 2023-04-16 NOTE — Progress Notes (Signed)
Established Patient Office Visit  Subjective:  Patient ID: John [name], male    DOB: December 13, 1955  Age: 67 y.o. MRN: [id number]  CC:  Chief Complaint  Patient presents with   chronic respiratory failure    Six month follow up     HPI John [name] is a 67 y.o. male with past medical history of CAD s/p stent placement, chronic hypoxic respiratory failure due to COPD and chronic leg edema who presents for f/u of his chronic medical conditions.  CAD and HTN: He has h/o CAD and follows up with Cardiology. BP is well-controlled. Takes medications regularly. Patient denies headache, dizziness, chest pain, or palpitations.   Chronic hypoxic respiratory failure due to COPD: He has been using home O2 - 3 lpm currently.  Denies any recent worsening of dyspnea, cough, or fatigue.  Denies any fever, chills, sore throat or nasal congestion.  He has been using Breztri and as needed albuterol for dyspnea.  Onychomycosis: He reports chronic thickening and yellowish discoloration of the right third toe nail.  He has mild pain around the toenail area as well.  Of note, he also reports purplish discoloration right foot toes upon sitting for few minutes.  Reports improvement in color of the toes upon leg elevation.  He also reports sharp pain in the toes then he has noticed purpleish discoloration.  He has limited ambulation due to chronic hypoxic respiratory failure.     Past Medical History:  Diagnosis Date   Alcohol abuse 12/27/2010   Allergic rhinitis, cause unspecified 12/27/2010   Anal warts 04/11/2012   CAD, NATIVE VESSEL 10/04/2010   COPD (chronic obstructive pulmonary disease) (HCC)    "a touch" (09/28/2013)   History of blood transfusion 03/2012   related to nose bleed   HYPERLIPIDEMIA-MIXED 10/04/2010   HYPERTENSION, BENIGN 10/04/2010   Myocardial infarction (HCC) 09/16/2010   Pneumonia    "when I was a kid"   Stented coronary artery    Mid LAD    TOBACCO ABUSE 10/04/2010     Past Surgical History:  Procedure Laterality Date   ABDOMINAL AORTIC ENDOVASCULAR STENT GRAFT N/A 07/28/2022   Procedure: ABDOMINAL AORTIC ENDOVASCULAR STENT GRAFT;  Surgeon: Victorino Sparrow, MD;  Location: [facility] OR;  Service: Vascular;  Laterality: N/A;   CARDIAC CATHETERIZATION  ~ 2012   CORONARY ANGIOPLASTY WITH STENT PLACEMENT  09/17/2010; 09/28/2014   "1; 2"   FINGER SURGERY Left    "almost cut off" tip of 2nd digit   LEFT HEART CATHETERIZATION WITH CORONARY ANGIOGRAM N/A 09/28/2014   Procedure: LEFT HEART CATHETERIZATION WITH CORONARY ANGIOGRAM;  Surgeon: Kathleene Hazel, MD;  Location: [facility] CATH LAB;  Service: Cardiovascular;  Laterality: N/A;   NASAL ENDOSCOPY WITH EPISTAXIS CONTROL Bilateral 03/2012   ULTRASOUND GUIDANCE FOR VASCULAR ACCESS N/A 07/28/2022   Procedure: ULTRASOUND GUIDANCE FOR VASCULAR ACCESS;  Surgeon: Victorino Sparrow, MD;  Location: [facility] OR;  Service: Vascular;  Laterality: N/A;   VASECTOMY      Family History  Problem Relation Age of Onset   Brain cancer Mother    Heart disease Father     Social History   Socioeconomic History   Marital status: Single    Spouse name: Not on file   Number of children: Not on file   Years of education: Not on file   Highest education level: Not on file  Occupational History   Occupation: Lobbyist maintenance work     Employer: [employer]    Comment: [employer]  Tobacco Use   Smoking status: Former    Current packs/day: 0.00    Average packs/day: 2.0 packs/day for 51.6 years (103.1 ttl pk-yrs)    Types: Cigarettes    Start date: 57    Quit date: 04/18/2020    Years since quitting: 2.9   Smokeless tobacco: Never   Tobacco comments:    2 plus packs per day. He has a 100 + pack - year history of tobacco abuse currently. Former 4 ppd for 25 years.  Vaping Use   Vaping status: Never Used  Substance and Sexual Activity   Alcohol use: Yes    Alcohol/week: 144.0 standard drinks of alcohol    Types: 144 Cans of  beer per week    Comment:  history 12 pack of beer per night".  2021- drinks 3 beer/day   Drug use: No   Sexual activity: Yes    Birth control/protection: None  Other Topics Concern   Not on file  Social History Narrative   The patient lives in [place] with his girlfriend. 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   Social Determinants of Health   Financial Resource Strain: Low Risk  (08/08/2022)   Overall Financial Resource Strain (CARDIA)    Difficulty of Paying Living Expenses: Not hard at all  Food Insecurity: No Food Insecurity (08/08/2022)   Hunger Vital Sign    Worried About Running Out of Food in the Last Year: Never true    Ran Out of Food in the Last Year: Never true  Transportation Needs: No Transportation Needs (08/08/2022)   PRAPARE - Administrator, Civil Service (Medical): No    Lack of Transportation (Non-Medical): No  Physical Activity: Inactive (06/02/2021)   Exercise Vital Sign    Days of Exercise per Week: 0 days    Minutes of Exercise per Session: 0 min  Stress: No Stress Concern Present (08/08/2022)   [employer] of Occupational Health - Occupational Stress Questionnaire    Feeling of Stress : Not at all  Social Connections: Moderately Integrated (08/08/2022)   Social Connection and Isolation Panel [NHANES]    Frequency of Communication with Friends and Family: More than three times a week    Frequency of Social Gatherings with Friends and Family: Three times a week    Attends Religious Services: Never    Active Member of Clubs or Organizations: Yes    Attends Banker Meetings: Never    Marital Status: Living with partner  Intimate Partner Violence: Not At Risk (06/02/2021)   Humiliation, Afraid, Rape, and Kick questionnaire    Fear of Current or Ex-Partner: No    Emotionally Abused: No    Physically Abused: No    Sexually Abused: No    Outpatient Medications Prior to Visit  Medication Sig  Dispense Refill   albuterol (PROAIR HFA) 108 (90 Base) MCG/ACT inhaler Inhale 2 puffs into the lungs every 4 (four) hours as needed for wheezing or shortness of breath. 2 puffs every 4 hours as needed only  if your can't catch your breath 8 g 5   albuterol (PROVENTIL) (2.5 MG/3ML) 0.083% nebulizer solution Take 3 mLs (2.5 mg total) by nebulization every 4 (four) hours as needed for wheezing or shortness of breath. 360 mL 5   ALPRAZolam (XANAX) 0.25 MG tablet Take 1 tablet (0.25 mg total) by mouth 3 (three) times daily as needed for anxiety. 60 tablet 5  aspirin EC 81 MG tablet Take 81 mg by mouth daily.     atorvastatin (LIPITOR) 40 MG tablet Take 1 tablet (40 mg total) by mouth daily. 90 tablet 3   azelastine (ASTELIN) 0.1 % nasal spray Place 1 spray into both nostrils 2 (two) times daily. Use in each nostril as directed 30 mL 12   Budeson-Glycopyrrol-Formoterol (BREZTRI AEROSPHERE) 160-9-4.8 MCG/ACT AERO Inhale 2 puffs into the lungs in the morning and at bedtime. 10.7 g 11   fluticasone (FLONASE) 50 MCG/ACT nasal spray Place 1 spray into both nostrils daily. 16 g 2   furosemide (LASIX) 40 MG tablet Take 1 tablet (40 mg total) by mouth daily. 90 tablet 3   guaiFENesin (MUCINEX) 600 MG 12 hr tablet Take 2 tablets (1,200 mg total) by mouth 2 (two) times daily as needed for cough or to loosen phlegm.     Multiple Vitamin (MULTIVITAMIN WITH MINERALS) TABS tablet Take 1 tablet by mouth daily.     nitroGLYCERIN (NITROSTAT) 0.4 MG SL tablet Place 1 tablet (0.4 mg total) under the tongue every 5 (five) minutes as needed for chest pain. 25 tablet 6   OXYGEN Inhale 2-3 L into the lungs.     No facility-administered medications prior to visit.    Allergies  Allergen Reactions   Penicillins Other (See Comments)    Passes out    ROS Review of Systems  Constitutional:  Negative for chills and fever.  HENT:  Negative for congestion and sore throat.   Eyes:  Negative for pain and discharge.   Respiratory:  Positive for cough and shortness of breath (Chronic).   Cardiovascular:  Negative for chest pain and palpitations.  Gastrointestinal:  Negative for constipation, diarrhea, nausea and vomiting.  Endocrine: Negative for polydipsia and polyuria.  Genitourinary:  Negative for dysuria and hematuria.  Musculoskeletal:  Negative for neck pain and neck stiffness.  Skin:  Positive for color change (Intermittent purplish discoloration of right toes). Negative for rash.  Neurological:  Negative for dizziness, weakness, numbness and headaches.  Psychiatric/Behavioral:  Negative for agitation and behavioral problems.       Objective:    Physical Exam Vitals reviewed.  Constitutional:      General: He is not in acute distress.    Appearance: He is not diaphoretic.     Comments: In electric scooter  HENT:     Head: Normocephalic and atraumatic.     Nose: Nose normal.     Mouth/Throat:     Mouth: Mucous membranes are moist.  Eyes:     General: No scleral icterus.    Extraocular Movements: Extraocular movements intact.  Cardiovascular:     Rate and Rhythm: Normal rate and regular rhythm.     Heart sounds: Normal heart sounds. No murmur heard. Pulmonary:     Breath sounds: Normal breath sounds. No wheezing or rales.     Comments: On 3 l O2 Musculoskeletal:     Cervical back: Neck supple. No tenderness.     Right lower leg: No edema.     Left lower leg: No edema.  Feet:     Right foot:     Toenail Condition: Right toenails are abnormally thick. Fungal disease present. Skin:    General: Skin is warm.     Findings: No rash.     Comments: Erythema of right toes  Neurological:     General: No focal deficit present.     Mental Status: He is alert and oriented to person, place,  and time.  Psychiatric:        Mood and Affect: Mood normal.        Behavior: Behavior normal.     BP 121/87 (BP Location: Left Arm, Patient Position: Sitting, Cuff Size: Normal)   Pulse 98   Ht  5\' 9"  (1.753 m)   Wt 154 lb 3.2 oz (69.9 kg)   SpO2 93%   BMI 22.77 kg/m  Wt Readings from Last 3 Encounters:  04/16/23 154 lb 3.2 oz (69.9 kg)  04/04/23 154 lb (69.9 kg)  02/28/23 152 lb (68.9 kg)    Lab Results  Component Value Date   TSH 2.650 10/14/2022   Lab Results  Component Value Date   WBC 6.6 10/14/2022   HGB 11.8 (L) 10/14/2022   HCT 37.5 10/14/2022   MCV 93 10/14/2022   PLT 245 10/14/2022   Lab Results  Component Value Date   NA 142 10/14/2022   K 4.6 10/14/2022   CO2 34 (H) 10/14/2022   GLUCOSE 95 10/14/2022   BUN 16 10/14/2022   CREATININE 1.10 10/14/2022   BILITOT 0.2 10/14/2022   ALKPHOS 147 (H) 10/14/2022   AST 29 10/14/2022   ALT 23 10/14/2022   PROT 6.3 10/14/2022   ALBUMIN 3.8 (L) 10/14/2022   CALCIUM 9.4 10/14/2022   ANIONGAP 10 07/30/2022   EGFR 74 10/14/2022   GFR 92.34 06/14/2020   Lab Results  Component Value Date   CHOL 159 10/14/2022   Lab Results  Component Value Date   HDL 76 10/14/2022   Lab Results  Component Value Date   LDLCALC 72 10/14/2022   Lab Results  Component Value Date   TRIG 55 10/14/2022   Lab Results  Component Value Date   CHOLHDL 2.1 10/14/2022   Lab Results  Component Value Date   HGBA1C 5.1 04/24/2020      Assessment & Plan:   Problem List Items Addressed This Visit       Cardiovascular and Mediastinum   PAD (peripheral artery disease) (HCC)    His right toe discoloration is concerning for PAD considering his history of CAD Although DPA pulses intact, would benefit from toe brachial index Advised to discuss with vascular surgery or cardiology On aspirin and statin currently        Respiratory   Chronic respiratory failure with hypoxia and hypercapnia (HCC) - Primary    On 3 L O2 at home Due to underlying COPD On Breztri Albuterol nebs and inhaler as needed Follows up with Dr. Craige Cotta Had evaluation by transplant team, not a surgical candidate      Chronic obstructive pulmonary  disease (HCC)    Well-controlled currently with Breztri Albuterol PRN F/u with Pulmonology        Musculoskeletal and Integument   Onychomycosis    Thick and yellowish discoloration of right third great toe nail Started terbinafine X 12 weeks      Relevant Medications   terbinafine (LAMISIL) 250 MG tablet     Other   GAD (generalized anxiety disorder)    Takes Xanax PRN, helps with hyperinflation as well      Status post endovascular aneurysm repair (EVAR)    For ruptured abdominal aortic aneurysm Followed by vascular surgery Recent US of abdomen: Patent endovascular aneurysm repair with no evidence of endoleak. The largest aortic diameter has decreased compared to prior exam.         Meds ordered this encounter  Medications   terbinafine (LAMISIL) 250 MG tablet  Sig: Take 1 tablet (250 mg total) by mouth daily.    Dispense:  28 tablet    Refill:  2    Follow-up: Return in about 6 months (around 10/17/2023).    Anabel Halon, MD

## 2023-04-16 NOTE — Patient Instructions (Addendum)
Please start taking Terbinafine for toenail infection.  Please continue to take other medications as prescribed.  Please continue to follow low salt diet and ambulate as tolerated.  Please apply lotion or moisturizer for dry skin.  Please consider getting Toe-brachial index testing with Cardiologist or Vascular surgery.

## 2023-04-16 NOTE — Assessment & Plan Note (Signed)
Thick and yellowish discoloration of right third great toe nail Started terbinafine X 12 weeks

## 2023-04-16 NOTE — Assessment & Plan Note (Signed)
Well-controlled currently with Breztri Albuterol PRN F/u with Pulmonology 

## 2023-04-16 NOTE — Assessment & Plan Note (Signed)
His right toe discoloration is concerning for PAD considering his history of CAD Although DPA pulses intact, would benefit from toe brachial index Advised to discuss with vascular surgery or cardiology On aspirin and statin currently

## 2023-04-16 NOTE — Assessment & Plan Note (Signed)
On 3 L O2 at home Due to underlying COPD On Breztri Albuterol nebs and inhaler as needed Follows up with Dr. Sood Had evaluation by transplant team, not a surgical candidate 

## 2023-04-19 DIAGNOSIS — J9610 Chronic respiratory failure, unspecified whether with hypoxia or hypercapnia: Secondary | ICD-10-CM | POA: Diagnosis not present

## 2023-04-19 DIAGNOSIS — J449 Chronic obstructive pulmonary disease, unspecified: Secondary | ICD-10-CM | POA: Diagnosis not present

## 2023-04-26 DIAGNOSIS — J9611 Chronic respiratory failure with hypoxia: Secondary | ICD-10-CM | POA: Diagnosis not present

## 2023-05-20 DIAGNOSIS — J449 Chronic obstructive pulmonary disease, unspecified: Secondary | ICD-10-CM | POA: Diagnosis not present

## 2023-05-20 DIAGNOSIS — J9610 Chronic respiratory failure, unspecified whether with hypoxia or hypercapnia: Secondary | ICD-10-CM | POA: Diagnosis not present

## 2023-05-27 DIAGNOSIS — J9611 Chronic respiratory failure with hypoxia: Secondary | ICD-10-CM | POA: Diagnosis not present

## 2023-06-11 ENCOUNTER — Telehealth: Payer: Self-pay | Admitting: Pulmonary Disease

## 2023-06-11 NOTE — Telephone Encounter (Signed)
Patient needs refills on  fluticasone and albuterol emergency inhaler.

## 2023-06-12 MED ORDER — ALBUTEROL SULFATE HFA 108 (90 BASE) MCG/ACT IN AERS: 2.0000 | INHALATION_SPRAY | RESPIRATORY_TRACT | 5 refills | Status: DC | PRN

## 2023-06-12 MED ORDER — FLUTICASONE PROPIONATE 50 MCG/ACT NA SUSP: 1.0000 | Freq: Every day | NASAL | 2 refills | Status: DC

## 2023-06-12 NOTE — Telephone Encounter (Signed)
Forwarding

## 2023-06-12 NOTE — Telephone Encounter (Signed)
Refills sent

## 2023-06-13 DIAGNOSIS — Z515 Encounter for palliative care: Secondary | ICD-10-CM | POA: Diagnosis not present

## 2023-06-13 DIAGNOSIS — J449 Chronic obstructive pulmonary disease, unspecified: Secondary | ICD-10-CM | POA: Diagnosis not present

## 2023-06-20 DIAGNOSIS — J449 Chronic obstructive pulmonary disease, unspecified: Secondary | ICD-10-CM | POA: Diagnosis not present

## 2023-06-20 DIAGNOSIS — J9610 Chronic respiratory failure, unspecified whether with hypoxia or hypercapnia: Secondary | ICD-10-CM | POA: Diagnosis not present

## 2023-06-26 DIAGNOSIS — J9611 Chronic respiratory failure with hypoxia: Secondary | ICD-10-CM | POA: Diagnosis not present

## 2023-06-26 NOTE — Progress Notes (Signed)
Chief Complaint  Patient presents with   Follow-up    CAD    History of Present Illness: 67 yo male with history of CAD , HTN, hyperlipidemia, chronic diastolic CHF and severe COPD who is here today for cardiac follow up. He was admitted to [facility] December 2011 with a NSTEMI. Cardiac cath 09/17/10 and was found to have a severe stenosis of the LAD which was treated with a drug eluting stent. He was also found to have moderate disease in the RCA (70%) and Circumflex (70%) managed medically. Cardiac cath June 2012 with stable CAD. He  had 3 admissions in July 2013 with severe epistaxis causing hemorrhagic shock with tachycardia and drop in blood pressure. This was treated with packing and transfusion. His Plavix was stopped. He was admitted January 2016 with unstable angina. Cardiac cath January 2016 with severe mid LAD stenosis, severe mid Circumflex stenosis. I placed a drug eluting stent in the mid LAD and a drug eluting stent in the mid Circumflex. LV function normal. I saw him in May 2016 and he c/o fatigue and dyspnea. His Coreg was stopped and he had resolution of his symptoms. He missed follow up appointments in 2017-2020 due to lack of insurance and cost. I saw him February 2020 with c/o dyspnea and lower extremity edema. He had been off of all medications due to losing his job and having no money. He responded well to Lasix. He has established in our pulmonary office with Dr. Craige Cotta. He was admitted to [facility] July 2021 with acute respiratory failure secondary to COPD exacerbation and community acquired pneumonia and was intubated. He was euvolemic on admission per notes but developed LE edema during his hospital stay. He was discharged on Lasix 40 mg po BID. Echo 04/19/20 with LVEF=65-70%. No valve disease. He has stopped smoking. He is on supplemental O2 24 hours per day now. He was seen at [facility] July 2022 and told he was not a candidate for lung transplant. Cardiac cath at [facility] July 2022  with 60% mid LAD stenosis, patent LAD stents, patent Circumflex stent. Echo July 2022 at [facility] with normal LV function and no valve disease. CTA abdomen at [facility] with 6.9 cm AAA. He was seen by Vascular surgery there and turned down for intervention. He was told he had 6 months to live and was made DNR and placed on palliative care. He was doing well overall when I saw him in September 2023. He was admitted to [facility] November 2023 with abdominal pain and his AAA had ruptured. He underwent endovascular aortic repair per Dr. Sherral Hammers. He has done well since then.   He is here today for follow up. The patient denies any chest pain, dyspnea, palpitations, lower extremity edema, orthopnea, PND, dizziness, near syncope or syncope. No change in baseline dyspnea. He is on 3 Liters supplemental O2.   Primary Care Physician: Anabel Halon, MD  Past Medical History:  Diagnosis Date   Alcohol abuse 12/27/2010   Allergic rhinitis, cause unspecified 12/27/2010   Anal warts 04/11/2012   CAD, NATIVE VESSEL 10/04/2010   COPD (chronic obstructive pulmonary disease) (HCC)    "a touch" (09/28/2013)   History of blood transfusion 03/2012   related to nose bleed   HYPERLIPIDEMIA-MIXED 10/04/2010   HYPERTENSION, BENIGN 10/04/2010   Myocardial infarction (HCC) 09/16/2010   Pneumonia    "when I was a kid"   Stented coronary artery    Mid LAD    TOBACCO ABUSE 10/04/2010  Past Surgical History:  Procedure Laterality Date   ABDOMINAL AORTIC ENDOVASCULAR STENT GRAFT N/A 07/28/2022   Procedure: ABDOMINAL AORTIC ENDOVASCULAR STENT GRAFT;  Surgeon: Victorino Sparrow, MD;  Location: [facility] OR;  Service: Vascular;  Laterality: N/A;   CARDIAC CATHETERIZATION  ~ 2012   CORONARY ANGIOPLASTY WITH STENT PLACEMENT  09/17/2010; 09/28/2014   "1; 2"   FINGER SURGERY Left    "almost cut off" tip of 2nd digit   LEFT HEART CATHETERIZATION WITH CORONARY ANGIOGRAM N/A 09/28/2014   Procedure: LEFT HEART CATHETERIZATION WITH CORONARY ANGIOGRAM;   Surgeon: Kathleene Hazel, MD;  Location: [facility] CATH LAB;  Service: Cardiovascular;  Laterality: N/A;   NASAL ENDOSCOPY WITH EPISTAXIS CONTROL Bilateral 03/2012   ULTRASOUND GUIDANCE FOR VASCULAR ACCESS N/A 07/28/2022   Procedure: ULTRASOUND GUIDANCE FOR VASCULAR ACCESS;  Surgeon: Victorino Sparrow, MD;  Location: [facility] OR;  Service: Vascular;  Laterality: N/A;   VASECTOMY      Current Outpatient Medications  Medication Sig Dispense Refill   albuterol (PROAIR HFA) 108 (90 Base) MCG/ACT inhaler Inhale 2 puffs into the lungs every 4 (four) hours as needed for wheezing or shortness of breath. 2 puffs every 4 hours as needed only  if your can't catch your breath 8 g 5   albuterol (PROVENTIL) (2.5 MG/3ML) 0.083% nebulizer solution Take 3 mLs (2.5 mg total) by nebulization every 4 (four) hours as needed for wheezing or shortness of breath. 360 mL 5   ALPRAZolam (XANAX) 0.25 MG tablet Take 1 tablet (0.25 mg total) by mouth 3 (three) times daily as needed for anxiety. 60 tablet 5   aspirin EC 81 MG tablet Take 81 mg by mouth daily.     atorvastatin (LIPITOR) 40 MG tablet Take 1 tablet (40 mg total) by mouth daily. 90 tablet 3   Budeson-Glycopyrrol-Formoterol (BREZTRI AEROSPHERE) 160-9-4.8 MCG/ACT AERO Inhale 2 puffs into the lungs in the morning and at bedtime. 10.7 g 11   fluticasone (FLONASE) 50 MCG/ACT nasal spray Place 1 spray into both nostrils daily. 16 g 2   furosemide (LASIX) 40 MG tablet Take 1 tablet (40 mg total) by mouth daily. 90 tablet 3   guaiFENesin (MUCINEX) 600 MG 12 hr tablet Take 2 tablets (1,200 mg total) by mouth 2 (two) times daily as needed for cough or to loosen phlegm.     Multiple Vitamin (MULTIVITAMIN WITH MINERALS) TABS tablet Take 1 tablet by mouth daily.     nitroGLYCERIN (NITROSTAT) 0.4 MG SL tablet Place 1 tablet (0.4 mg total) under the tongue every 5 (five) minutes as needed for chest pain. 25 tablet 6   OXYGEN Inhale 2-3 L into the lungs.     No current  facility-administered medications for this visit.    Allergies  Allergen Reactions   Penicillins Other (See Comments)    Passes out    Social History   Socioeconomic History   Marital status: Single    Spouse name: Not on file   Number of children: Not on file   Years of education: Not on file   Highest education level: Not on file  Occupational History   Occupation: Lobbyist maintenance work     Employer: [employer]    Comment: Ronette Deter  Tobacco Use   Smoking status: Former    Current packs/day: 0.00    Average packs/day: 2.0 packs/day for 51.6 years (103.1 ttl pk-yrs)    Types: Cigarettes    Start date: 58    Quit date: 04/18/2020  Years since quitting: 3.1   Smokeless tobacco: Never   Tobacco comments:    2 plus packs per day. He has a 100 + pack - year history of tobacco abuse currently. Former 4 ppd for 25 years.  Vaping Use   Vaping status: Never Used  Substance and Sexual Activity   Alcohol use: Yes    Alcohol/week: 144.0 standard drinks of alcohol    Types: 144 Cans of beer per week    Comment:  history 12 pack of beer per night".  2021- drinks 3 beer/day   Drug use: No   Sexual activity: Yes    Birth control/protection: None  Other Topics Concern   Not on file  Social History Narrative   The patient lives in [place] with his girlfriend. 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   Social Determinants of Health   Financial Resource Strain: Low Risk  (08/08/2022)   Overall Financial Resource Strain (CARDIA)    Difficulty of Paying Living Expenses: Not hard at all  Food Insecurity: No Food Insecurity (08/08/2022)   Hunger Vital Sign    Worried About Running Out of Food in the Last Year: Never true    Ran Out of Food in the Last Year: Never true  Transportation Needs: No Transportation Needs (08/08/2022)   PRAPARE - Administrator, Civil Service (Medical): No    Lack of Transportation (Non-Medical): No   Physical Activity: Inactive (06/02/2021)   Exercise Vital Sign    Days of Exercise per Week: 0 days    Minutes of Exercise per Session: 0 min  Stress: No Stress Concern Present (08/08/2022)   [employer] of Occupational Health - Occupational Stress Questionnaire    Feeling of Stress : Not at all  Social Connections: Moderately Integrated (08/08/2022)   Social Connection and Isolation Panel [NHANES]    Frequency of Communication with Friends and Family: More than three times a week    Frequency of Social Gatherings with Friends and Family: Three times a week    Attends Religious Services: Never    Active Member of Clubs or Organizations: Yes    Attends Banker Meetings: Never    Marital Status: Living with partner  Intimate Partner Violence: Not At Risk (06/02/2021)   Humiliation, Afraid, Rape, and Kick questionnaire    Fear of Current or Ex-Partner: No    Emotionally Abused: No    Physically Abused: No    Sexually Abused: No    Family History  Problem Relation Age of Onset   Brain cancer Mother    Heart disease Father     Review of Systems:  As stated in the HPI and otherwise negative.   BP 114/70   Pulse 70   Ht 5\' 9"  (1.753 m)   Wt 70 kg   SpO2 94%   BMI 22.80 kg/m   Physical Examination:  General: Well developed, well nourished, NAD  HEENT: OP clear, mucus membranes moist  SKIN: warm, dry. No rashes. Neuro: No focal deficits  Musculoskeletal: Muscle strength 5/5 all ext  Psychiatric: Mood and affect normal  Neck: No JVD, no carotid bruits, no thyromegaly, no lymphadenopathy.  Lungs:Clear bilaterally, no wheezes, rhonci, crackles Cardiovascular: Regular rate and rhythm. No murmurs, gallops or rubs. Abdomen:Soft. Bowel sounds present. Non-tender.  Extremities: No lower extremity edema. Pulses are 2 + in the bilateral DP/PT.  Echo 04/19/20:  1. Left ventricular ejection fraction, by estimation, is 65  to 70%. The  left ventricle has normal  function. The left ventricle has no regional  wall motion abnormalities. Left ventricular diastolic parameters were  normal.   2. Right ventricular systolic function is normal. The right ventricular  size is normal. There is normal pulmonary artery systolic pressure.   3. The mitral valve is normal in structure. No evidence of mitral valve  regurgitation. No evidence of mitral stenosis.   4. The aortic valve is tricuspid. Aortic valve regurgitation is not  visualized. No aortic stenosis is present.   5. The inferior vena cava is normal in size with greater than 50%  respiratory variability, suggesting right atrial pressure of 3 mmHg.   Echo, Cath reports from [facility] are in Care Everywhere  EKG:  EKG is not ordered today. The ekg ordered today demonstrates   Recent Labs: 07/28/2022: Magnesium 1.7 10/14/2022: ALT 23; BUN 16; Creatinine, Ser 1.10; Hemoglobin 11.8; Platelets 245; Potassium 4.6; Sodium 142; TSH 2.650   Lipid Panel    Component Value Date/Time   CHOL 159 10/14/2022 1347   TRIG 55 10/14/2022 1347   TRIG 82 08/15/2006 0929   HDL 76 10/14/2022 1347   CHOLHDL 2.1 10/14/2022 1347   CHOLHDL 2.3 10/10/2020 0845   VLDL 12 10/10/2020 0845   LDLCALC 72 10/14/2022 1347     Wt Readings from Last 3 Encounters:  06/27/23 70 kg  04/16/23 69.9 kg  04/04/23 69.9 kg    Assessment and Plan:   1. CAD without angina: Cath at [facility] in July 2022 during workup for possible lung transplant. Moderate mid LAD stenosis with patent LAD and Circumflex stents. LV function normal by echo July 2022 at [facility] with no valve disease. He does not tolerate beta blockers due to fatigue. Now DNR and on palliative care. He has no chest pain suggestive of angina. Will continue ASA and statin.      2. Tobacco abuse, in remission: He is no longer smoking  3. HTN: BP is well controlled. No changes today  4. Hyperlipidemia: LDL near goal in January 2024. Will not repeat lipids given his poor prognosis with  advanced lung disease. LFTs normal in January 2024. Continue statin  5. Chronic diastolic CHF: LV function normal by echo July 2022. No volume overload on exam. Continue Lasix.   6. Dyspnea/COPD: He is followed in the pulmonary office. Now on continuous supplemental O2. Not a candidate for lung transplant. He is now DNR and palliative care is following  7. AAA: Large 6.9 cm AAA on CTA at [facility] in 2022. He was seen by vascular surgery there but not felt to be a candidate for intervention. He was admitted to [facility] in November 2023 with rupture of his AAA and had endovascular repair per Dr. Sherral Hammers.   Labs/ tests ordered today include:   No orders of the defined types were placed in this encounter.  Disposition:   F/U with me in 6 months  Signed, Verne Carrow, MD 06/27/2023 8:44 AM    [facility] [address] [place], [place], [place]  [address] Phone: [phone]; Fax: [phone]

## 2023-06-27 ENCOUNTER — Ambulatory Visit: Payer: Medicare Other | Attending: Cardiovascular Disease | Admitting: Cardiovascular Disease

## 2023-06-27 ENCOUNTER — Encounter: Payer: Self-pay | Admitting: Cardiovascular Disease

## 2023-06-27 VITALS — BP 114/70 | HR 70 | Ht 69.0 in | Wt 154.4 lb

## 2023-06-27 DIAGNOSIS — I2510 Atherosclerotic heart disease of native coronary artery without angina pectoris: Secondary | ICD-10-CM

## 2023-06-27 DIAGNOSIS — E7800 Pure hypercholesterolemia, unspecified: Secondary | ICD-10-CM

## 2023-06-27 DIAGNOSIS — I5032 Chronic diastolic (congestive) heart failure: Secondary | ICD-10-CM

## 2023-06-27 DIAGNOSIS — I10 Essential (primary) hypertension: Secondary | ICD-10-CM

## 2023-06-27 DIAGNOSIS — I7143 Infrarenal abdominal aortic aneurysm, without rupture: Secondary | ICD-10-CM

## 2023-06-27 NOTE — Patient Instructions (Signed)
Medication Instructions:  No changes *If you need a refill on your cardiac medications before your next appointment, please call your pharmacy*   Lab Work: none   Testing/Procedures: none   Follow-Up: At [facility], you and your health needs are our priority.  As part of our continuing mission to provide you with exceptional heart care, we have created designated Provider Care Teams.  These Care Teams include your primary Cardiologist (physician) and Advanced Practice Providers (APPs -  Physician Assistants and Nurse Practitioners) who all work together to provide you with the care you need, when you need it.   Your next appointment:   6 month(s)  Provider:   Lauree Chandler, MD

## 2023-07-20 DIAGNOSIS — J9610 Chronic respiratory failure, unspecified whether with hypoxia or hypercapnia: Secondary | ICD-10-CM | POA: Diagnosis not present

## 2023-07-20 DIAGNOSIS — J449 Chronic obstructive pulmonary disease, unspecified: Secondary | ICD-10-CM | POA: Diagnosis not present

## 2023-07-23 ENCOUNTER — Ambulatory Visit (HOSPITAL_BASED_OUTPATIENT_CLINIC_OR_DEPARTMENT_OTHER): Payer: Medicare Other | Admitting: Pulmonary Disease

## 2023-07-23 ENCOUNTER — Encounter (HOSPITAL_BASED_OUTPATIENT_CLINIC_OR_DEPARTMENT_OTHER): Payer: Self-pay | Admitting: Pulmonary Disease

## 2023-07-23 VITALS — BP 118/78 | HR 102 | Resp 16 | Ht 69.0 in | Wt 154.4 lb

## 2023-07-23 DIAGNOSIS — J9612 Chronic respiratory failure with hypercapnia: Secondary | ICD-10-CM

## 2023-07-23 DIAGNOSIS — R911 Solitary pulmonary nodule: Secondary | ICD-10-CM

## 2023-07-23 DIAGNOSIS — J9611 Chronic respiratory failure with hypoxia: Secondary | ICD-10-CM

## 2023-07-23 DIAGNOSIS — J439 Emphysema, unspecified: Secondary | ICD-10-CM

## 2023-07-23 DIAGNOSIS — Z23 Encounter for immunization: Secondary | ICD-10-CM

## 2023-07-23 MED ORDER — PREDNISONE 10 MG PO TABS: 10.0000 mg | ORAL_TABLET | Freq: Every day | ORAL | 0 refills | Status: DC

## 2023-07-23 NOTE — Assessment & Plan Note (Addendum)
Continue Breztri twice daily. Use albuterol every 6 hours as needed Flu shot given today. RSV and COVID at pharmacy

## 2023-07-23 NOTE — Progress Notes (Signed)
Subjective:    Patient ID: John [name], male    DOB: 10-Jun-1956, 67 y.o.   MRN: [id number]  HPI John [name] 67 y.o. 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  VS pt -100 pack-year smoking history , quit 2021 -assessment at [facility] in July 2022 -NOT a candidate for lung transplant due to coronary artery disease and infrarenal abdominal aortic aneurysm   Past Medical History:  PNA, HTN, HLD, CAD, Seizure, ETOH, infrarenal abdominal aortic aneurysm  Chief Complaint  Patient presents with   Establish Care    Former [facility] patient-lung nodules. 2 month FU. Breathing has changed, more SOB, using nebulizer and rescue inhaler more than normal.    Accompanied by girlfriend John [name] and daughter John [name] He present to establish care He is on 3 L oxygen in the daytime, 4 L on exertion, unable to use NIV during sleep but tries to use at least 4 hours in the daytime. His breathing has been worse since his last visit 03/2023 No cough or sputum production. No pedal edema, he is compliant with Lasix. He is compliant with Breztri. He uses albuterol on an as-needed basis  GF request for refill on alprazolam.     Significant tests/ events reviewed  Pulmonary testing:  PFT 12/09/19 >> FEV1 0.59 (16%), FEV1% 37, DLCO 27% A1AT 03/09/20 >> 110, MZ ABG 02/12/21 >> pH 7.28, PCO2 80.9, PO2 109 on 40% FiO2 PFT 04/11/21 >> FEV1 0.6 (18%), FEV1% 26, DLCO 21%   Chest Imaging:  CT angio chest 11/23/19 >> moderate centrilobular emphysema, mild infrahilar BTX, 6 mm nodule LUL CT chest 01/02/21 >> no change LUL nodule, new 1.1 cm LLL opacity CT chest 03/28/21 >> severe emphysema, 2.2 x 0.8 cm band like opacity RLL from ATX, mucus plugging, 3 mm nodule RUL, 1.6 x 0.7 cm nodularity LUL CT chest 04/09/21 >> diffuse centrilobular emphysema, stable nodule LUL, 8 mm nodule RLL CT chest 03/27/22 >> stable GGO LLL CT chest 03/17/23 >>  stable GGO LLL   Review of Systems neg for any significant sore throat, dysphagia, itching, sneezing, nasal congestion or excess/ purulent secretions, fever, chills, sweats, unintended wt loss, pleuritic or exertional cp, hempoptysis, orthopnea pnd or change in chronic leg swelling. Also denies presyncope, palpitations, heartburn, abdominal pain, nausea, vomiting, diarrhea or change in bowel or urinary habits, dysuria,hematuria, rash, arthralgias, visual complaints, headache, numbness weakness or ataxia.     Objective:   Physical Exam   Gen. Pleasant, well-nourished, in no distress, normal affect, motorized wheelchair ENT - no pallor,icterus, no post nasal drip Neck: No JVD, no thyromegaly, no carotid bruits Lungs: no use of accessory muscles, no dullness to percussion, decreased bilateral without rales or rhonchi  Cardiovascular: Rhythm regular, heart sounds  normal, no murmurs or gallops, no peripheral edema Abdomen: soft and non-tender, no hepatosplenomegaly, BS normal. Musculoskeletal: No deformities, no cyanosis or clubbing Neuro:  alert, non focal         Assessment & Plan:

## 2023-07-23 NOTE — Assessment & Plan Note (Addendum)
Continue 3 L oxygen 24/7. Emphasized use of NIV during sleep when he is likely to have hypoventilation. We will consider pulmonary rehab in the future Refill on Xanax as needed for anxiety

## 2023-07-23 NOTE — Addendum Note (Signed)
Addended by: Jama Flavors on: 07/23/2023 01:06 PM   Modules accepted: Orders

## 2023-07-23 NOTE — Assessment & Plan Note (Signed)
Annual lung cancer screening next would be due in 02/2024

## 2023-07-23 NOTE — Patient Instructions (Addendum)
x flu shot today  X Prednisone 10 mg tabs Take 4 tabs  daily with food x 4 days, then 3 tabs daily x 4 days, then 2 tabs daily x 4 days, then 1 tab daily x4 days then stop. #40  We will consider pulmonary rehab in the future  Continue on Breztri  Use albuterol every 6 hours as needed

## 2023-07-27 DIAGNOSIS — J9611 Chronic respiratory failure with hypoxia: Secondary | ICD-10-CM | POA: Diagnosis not present

## 2023-08-19 ENCOUNTER — Ambulatory Visit (INDEPENDENT_AMBULATORY_CARE_PROVIDER_SITE_OTHER): Payer: Medicare Other

## 2023-08-20 DIAGNOSIS — J9610 Chronic respiratory failure, unspecified whether with hypoxia or hypercapnia: Secondary | ICD-10-CM | POA: Diagnosis not present

## 2023-08-20 DIAGNOSIS — J449 Chronic obstructive pulmonary disease, unspecified: Secondary | ICD-10-CM | POA: Diagnosis not present

## 2023-08-26 DIAGNOSIS — J9611 Chronic respiratory failure with hypoxia: Secondary | ICD-10-CM | POA: Diagnosis not present

## 2023-08-28 ENCOUNTER — Other Ambulatory Visit (HOSPITAL_BASED_OUTPATIENT_CLINIC_OR_DEPARTMENT_OTHER): Payer: Self-pay

## 2023-08-28 MED ORDER — BREZTRI AEROSPHERE 160-9-4.8 MCG/ACT IN AERO: 2.0000 | INHALATION_SPRAY | Freq: Two times a day (BID) | RESPIRATORY_TRACT | 11 refills | Status: DC

## 2023-09-09 ENCOUNTER — Ambulatory Visit (INDEPENDENT_AMBULATORY_CARE_PROVIDER_SITE_OTHER): Payer: Medicare Other

## 2023-09-10 ENCOUNTER — Ambulatory Visit (INDEPENDENT_AMBULATORY_CARE_PROVIDER_SITE_OTHER): Payer: Medicare Other

## 2023-09-10 VITALS — Ht 69.0 in | Wt 154.0 lb

## 2023-09-10 DIAGNOSIS — Z0000 Encounter for general adult medical examination without abnormal findings: Secondary | ICD-10-CM

## 2023-09-10 NOTE — Progress Notes (Signed)
Subjective:   John [name] is a 67 y.o. male who presents for Medicare Annual/Subsequent preventive examination.  Visit Complete: Virtual I connected with  John [name] on 09/10/23 by a audio enabled telemedicine application and verified that I am speaking with the correct person using two identifiers.  Patient Location: Home  Provider Location: Office/Clinic  I discussed the limitations of evaluation and management by telemedicine. The patient expressed understanding and agreed to proceed.  Vital Signs: Because this visit was a virtual/telehealth visit, some criteria may be missing or patient reported. Any vitals not documented were not able to be obtained and vitals that have been documented are patient reported.  Cardiac Risk Factors include: advanced age (>83men, >27 women);sedentary lifestyle;dyslipidemia;family history of premature cardiovascular disease;hypertension;male gender     Objective:    Today's Vitals   09/10/23 1305  Weight: 154 lb (69.9 kg)  Height: 5\' 9"  (1.753 m)  PainSc: 0-No pain   Body mass index is 22.74 kg/m.     09/10/2023    1:08 PM 08/08/2022   11:35 AM 07/27/2022    9:01 PM 06/02/2021    9:40 AM 02/28/2021    1:11 PM 02/12/2021    7:04 PM 12/20/2020    9:33 PM  Advanced Directives  Does Patient Have a Medical Advance Directive? Yes Yes No Yes No No No  Type of Estate agent of [place];Living will Healthcare Power of [employer] Power of Attorney     Does patient want to make changes to medical advance directive? No - Patient declined No - Patient declined       Copy of Healthcare Power of Attorney in Chart? Yes - validated most recent copy scanned in chart (See row information) Yes - validated most recent copy scanned in chart (See row information)  Yes - validated most recent copy scanned in chart (See row information)     Would patient like information on creating a medical advance directive?     Yes  (MAU/Ambulatory/Procedural Areas - Information given)  No - Patient declined    Current Medications (verified) Outpatient Encounter Medications as of 09/10/2023  Medication Sig   albuterol (PROAIR HFA) 108 (90 Base) MCG/ACT inhaler Inhale 2 puffs into the lungs every 4 (four) hours as needed for wheezing or shortness of breath. 2 puffs every 4 hours as needed only  if your can't catch your breath   albuterol (PROVENTIL) (2.5 MG/3ML) 0.083% nebulizer solution Take 3 mLs (2.5 mg total) by nebulization every 4 (four) hours as needed for wheezing or shortness of breath.   ALPRAZolam (XANAX) 0.25 MG tablet Take 1 tablet (0.25 mg total) by mouth 3 (three) times daily as needed for anxiety.   aspirin EC 81 MG tablet Take 81 mg by mouth daily.   atorvastatin (LIPITOR) 40 MG tablet Take 1 tablet (40 mg total) by mouth daily.   Budeson-Glycopyrrol-Formoterol (BREZTRI AEROSPHERE) 160-9-4.8 MCG/ACT AERO Inhale 2 puffs into the lungs in the morning and at bedtime.   fluticasone (FLONASE) 50 MCG/ACT nasal spray Place 1 spray into both nostrils daily.   furosemide (LASIX) 40 MG tablet Take 1 tablet (40 mg total) by mouth daily.   guaiFENesin (MUCINEX) 600 MG 12 hr tablet Take 2 tablets (1,200 mg total) by mouth 2 (two) times daily as needed for cough or to loosen phlegm.   Multiple Vitamin (MULTIVITAMIN WITH MINERALS) TABS tablet Take 1 tablet by mouth daily.   nitroGLYCERIN (NITROSTAT) 0.4 MG SL tablet Place 1 tablet (  0.4 mg total) under the tongue every 5 (five) minutes as needed for chest pain.   OXYGEN Inhale 2-3 L into the lungs.   predniSONE (DELTASONE) 10 MG tablet Take 1 tablet (10 mg total) by mouth daily with breakfast. Take 4 tabs  daily with food x 4 days, then 3 tabs daily x 4 days, then 2 tabs daily x 4 days, then 1 tab daily x4 days then stop. #40   No facility-administered encounter medications on file as of 09/10/2023.    Allergies (verified) Penicillins   History: Past Medical History:   Diagnosis Date   Alcohol abuse 12/27/2010   Allergic rhinitis, cause unspecified 12/27/2010   Anal warts 04/11/2012   CAD, NATIVE VESSEL 10/04/2010   COPD (chronic obstructive pulmonary disease) (HCC)    "a touch" (09/28/2013)   History of blood transfusion 03/2012   related to nose bleed   HYPERLIPIDEMIA-MIXED 10/04/2010   HYPERTENSION, BENIGN 10/04/2010   Myocardial infarction (HCC) 09/16/2010   Pneumonia    "when I was a kid"   Stented coronary artery    Mid LAD    TOBACCO ABUSE 10/04/2010   Past Surgical History:  Procedure Laterality Date   ABDOMINAL AORTIC ENDOVASCULAR STENT GRAFT N/A 07/28/2022   Procedure: ABDOMINAL AORTIC ENDOVASCULAR STENT GRAFT;  Surgeon: Victorino Sparrow, MD;  Location: [facility] OR;  Service: Vascular;  Laterality: N/A;   CARDIAC CATHETERIZATION  ~ 2012   CORONARY ANGIOPLASTY WITH STENT PLACEMENT  09/17/2010; 09/28/2014   "1; 2"   FINGER SURGERY Left    "almost cut off" tip of 2nd digit   LEFT HEART CATHETERIZATION WITH CORONARY ANGIOGRAM N/A 09/28/2014   Procedure: LEFT HEART CATHETERIZATION WITH CORONARY ANGIOGRAM;  Surgeon: Kathleene Hazel, MD;  Location: [facility] CATH LAB;  Service: Cardiovascular;  Laterality: N/A;   NASAL ENDOSCOPY WITH EPISTAXIS CONTROL Bilateral 03/2012   ULTRASOUND GUIDANCE FOR VASCULAR ACCESS N/A 07/28/2022   Procedure: ULTRASOUND GUIDANCE FOR VASCULAR ACCESS;  Surgeon: Victorino Sparrow, MD;  Location: [facility] OR;  Service: Vascular;  Laterality: N/A;   VASECTOMY     Family History  Problem Relation Age of Onset   Brain cancer Mother    Heart disease Father    Social History   Socioeconomic History   Marital status: Single    Spouse name: Not on file   Number of children: Not on file   Years of education: Not on file   Highest education level: Not on file  Occupational History   Occupation: Lobbyist maintenance work     Employer: [employer]    Comment: Ronette Deter  Tobacco Use   Smoking status: Former    Current packs/day: 0.00     Average packs/day: 2.0 packs/day for 51.6 years (103.1 ttl pk-yrs)    Types: Cigarettes    Start date: 81    Quit date: 04/18/2020    Years since quitting: 3.3   Smokeless tobacco: Never   Tobacco comments:    2 plus packs per day. He has a 100 + pack - year history of tobacco abuse currently. Former 4 ppd for 25 years.  Vaping Use   Vaping status: Never Used  Substance and Sexual Activity   Alcohol use: Yes    Alcohol/week: 144.0 standard drinks of alcohol    Types: 144 Cans of beer per week    Comment:  history 12 pack of beer per night".  2021- drinks 3 beer/day   Drug use: No   Sexual activity: Yes  Birth control/protection: None  Other Topics Concern   Not on file  Social History Narrative   The patient lives in [place] with his girlfriend. 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   Social Drivers of Corporate investment banker Strain: Low Risk  (09/10/2023)   Overall Financial Resource Strain (CARDIA)    Difficulty of Paying Living Expenses: Not hard at all  Food Insecurity: No Food Insecurity (09/10/2023)   Hunger Vital Sign    Worried About Running Out of Food in the Last Year: Never true    Ran Out of Food in the Last Year: Never true  Transportation Needs: No Transportation Needs (09/10/2023)   PRAPARE - Administrator, Civil Service (Medical): No    Lack of Transportation (Non-Medical): No  Physical Activity: Inactive (09/10/2023)   Exercise Vital Sign    Days of Exercise per Week: 0 days    Minutes of Exercise per Session: 0 min  Stress: No Stress Concern Present (09/10/2023)   [employer] of Occupational Health - Occupational Stress Questionnaire    Feeling of Stress : Not at all  Social Connections: Moderately Integrated (09/10/2023)   Social Connection and Isolation Panel [NHANES]    Frequency of Communication with Friends and Family: More than three times a week    Frequency of Social Gatherings with  Friends and Family: Three times a week    Attends Religious Services: Never    Active Member of Clubs or Organizations: Yes    Attends Banker Meetings: Never    Marital Status: Living with partner    Tobacco Counseling Counseling given: Not Answered Tobacco comments: 2 plus packs per day. He has a 100 + pack - year history of tobacco abuse currently. Former 4 ppd for 25 years.   Clinical Intake:  Pre-visit preparation completed: Yes  Pain : No/denies pain Pain Score: 0-No pain     BMI - recorded: 22.74 Nutritional Status: BMI of 19-24  Normal Nutritional Risks: None Diabetes: No  How often do you need to have someone help you when you read instructions, pamphlets, or other written materials from your doctor or pharmacy?: 1 - Never What is the last grade level you completed in school?: HSG; ELECTRICAL MAINTENANCE  Interpreter Needed?: No  Information entered by :: Johnsie Moscoso N. Lashala Laser, LPN.   Activities of Daily Living    09/10/2023    1:11 PM  In your present state of health, do you have any difficulty performing the following activities:  Hearing? 0  Vision? 0  Difficulty concentrating or making decisions? 0  Walking or climbing stairs? 0  Dressing or bathing? 0  Doing errands, shopping? 0  Preparing Food and eating ? N  Using the Toilet? N  In the past six months, have you accidently leaked urine? N  Do you have problems with loss of bowel control? N  Managing your Medications? N  Managing your Finances? N  Housekeeping or managing your Housekeeping? N    Patient Care Team: Anabel Halon, MD as PCP - General (Internal Medicine) Kathleene Hazel, MD as PCP - Cardiology (Cardiology) Jena Gauss Gerrit Friends, MD as Consulting Physician (Gastroenterology) Daisy Lazar, DO as Consulting Physician (Optometry)  Indicate any recent Medical Services you may have received from other than [facility] providers in the past year (date may be approximate).      Assessment:   This is a routine wellness examination for Terrez.  Hearing/Vision screen Hearing Screening - Comments:: Denies hearing difficulties.    Vision Screening - Comments:: Wears rx glasses - up to date with routine eye exams with Dr. Charise Killian    Goals Addressed   None   Depression Screen    09/10/2023    1:11 PM 04/16/2023    1:09 PM 10/14/2022    1:08 PM 08/08/2022   11:37 AM 04/09/2022    1:05 PM 10/10/2021    1:05 PM 06/06/2021    1:00 PM  PHQ 2/9 Scores  PHQ - 2 Score 0 0 0 0 0 0 0  PHQ- 9 Score 0          Fall Risk    09/10/2023    1:09 PM 04/16/2023    1:09 PM 10/14/2022    1:08 PM 08/08/2022   11:37 AM 04/09/2022    1:05 PM  Fall Risk   Falls in the past year? 0 0 0 0 0  Number falls in past yr: 0 0 0 0 0  Injury with Fall? 0 0 0 0 0  Risk for fall due to : No Fall Risks   No Fall Risks No Fall Risks  Follow up Falls prevention discussed   Follow up appointment Falls evaluation completed    MEDICARE RISK AT HOME: Medicare Risk at Home Any stairs in or around the home?: No If so, are there any without handrails?: No Home free of loose throw rugs in walkways, pet beds, electrical cords, etc?: Yes Adequate lighting in your home to reduce risk of falls?: Yes Life alert?: No Use of a cane, walker or w/c?: No Grab bars in the bathroom?: No Shower chair or bench in shower?: No Elevated toilet seat or a handicapped toilet?: Yes  TIMED UP AND GO:  Was the test performed?  No    Cognitive Function:    09/10/2023    1:10 PM 06/02/2021    9:42 AM  MMSE - Mini Mental State Exam  Not completed: Unable to complete Unable to complete        09/10/2023    1:10 PM 08/08/2022   11:39 AM 06/02/2021    9:42 AM  6CIT Screen  What Year? 0 points 0 points 0 points  What month? 0 points 0 points 0 points  What time? 0 points 0 points 0 points  Count back from 20 0 points 0 points 0 points  Months in reverse 0 points 0 points 0 points  Repeat phrase 0 points  0 points 0 points  Total Score 0 points 0 points 0 points    Immunizations Immunization History  Administered Date(s) Administered   Fluad Quad(high Dose 65+) 06/23/2022   Fluad Trivalent(High Dose 65+) 07/23/2023   Hep A / Hep B 04/10/2021   Hepatitis B, ADULT 04/10/2021   Influenza,inj,Quad PF,6+ Mos 07/05/2020, 07/31/2021   Influenza-Unspecified 07/05/2020   Moderna Covid-19 Fall Seasonal Vaccine 38yrs & older 08/11/2023   Moderna Sars-Covid-2 Vaccination 12/15/2019, 01/12/2020, 08/15/2020   Pneumococcal Conjugate-13 10/10/2021   Pneumococcal Polysaccharide-23 10/11/2020    TDAP status: Due, Education has been provided regarding the importance of this vaccine. Advised may receive this vaccine at local pharmacy or Health Dept. Aware to provide a copy of the vaccination record if obtained from local pharmacy or Health Dept. Verbalized acceptance and understanding.  Flu Vaccine status: Up to date  Pneumococcal vaccine status: Up to date  Covid-19 vaccine status: Completed vaccines  Qualifies for Shingles Vaccine? Yes   Zostavax completed No  Shingrix Completed?: No.    Education has been provided regarding the importance of this vaccine. Patient has been advised to call insurance company to determine out of pocket expense if they have not yet received this vaccine. Advised may also receive vaccine at local pharmacy or Health Dept. Verbalized acceptance and understanding.  Screening Tests Health Maintenance  Topic Date Due   DTaP/Tdap/Td (1 - Tdap) Never done   Zoster Vaccines- Shingrix (1 of 2) Never done   Colonoscopy  05/01/2022   Lung Cancer Screening  03/12/2024   Medicare Annual Wellness (AWV)  09/09/2024   Fecal DNA (Cologuard)  04/22/2025   Pneumonia Vaccine 78+ Years old (3 of 3 - PPSV23 or PCV20) 10/11/2025   INFLUENZA VACCINE  Completed   COVID-19 Vaccine  Completed   Hepatitis C Screening  Completed   HPV VACCINES  Aged Out    Health Maintenance  Health  Maintenance Due  Topic Date Due   DTaP/Tdap/Td (1 - Tdap) Never done   Zoster Vaccines- Shingrix (1 of 2) Never done   Colonoscopy  05/01/2022    Colorectal cancer screening: Type of screening: Cologuard. Completed 04/30/2022. Repeat every 3 years-patient due for colonoscopy since Cologuard testing was positive.  Lung Cancer Screening: (Low Dose CT Chest recommended if Age 98-80 years, 20 pack-year currently smoking OR have quit w/in 15years.) does qualify.   Lung Cancer Screening Referral: due 03/12/2024  Additional Screening:  Hepatitis C Screening: does qualify; Completed 10/14/2022  Vision Screening: Recommended annual ophthalmology exams for early detection of glaucoma and other disorders of the eye. Is the patient up to date with their annual eye exam?  Yes  Who is the provider or what is the name of the office in which the patient attends annual eye exams? Daisy Lazar, OD. If pt is not established with a provider, would they like to be referred to a provider to establish care? No .   Dental Screening: Recommended annual dental exams for proper oral hygiene  Community Resource Referral / Chronic Care Management: CRR required this visit?  No   CCM required this visit?  No     Plan:     I have personally reviewed and noted the following in the patient's chart:   Medical and social history Use of alcohol, tobacco or illicit drugs  Current medications and supplements including opioid prescriptions. Patient is not currently taking opioid prescriptions. Functional ability and status Nutritional status Physical activity Advanced directives List of other physicians Hospitalizations, surgeries, and ER visits in previous 12 months Vitals Screenings to include cognitive, depression, and falls Referrals and appointments  In addition, I have reviewed and discussed with patient certain preventive protocols, quality metrics, and best practice recommendations. A written  personalized care plan for preventive services as well as general preventive health recommendations were provided to patient.     Mickeal Needy, LPN   [id number]   After Visit Summary: (MyChart) Due to this being a telephonic visit, the after visit summary with patients personalized plan was offered to patient via MyChart   Nurse Notes: patient due for colonoscopy since Cologuard testing was positive.

## 2023-09-10 NOTE — Patient Instructions (Addendum)
John [name] , Thank you for taking time to come for your Medicare Wellness Visit. I appreciate your ongoing commitment to your health goals. Please review the following plan we discussed and let me know if I can assist you in the future.   Referrals/Orders/Follow-Ups/Clinician Recommendations: You are due for a colonoscopy. Please discuss this with your primary care physician at your next office visit.  This is a list of the screening recommended for you and due dates:  Health Maintenance  Topic Date Due   DTaP/Tdap/Td vaccine (1 - Tdap) Never done   Zoster (Shingles) Vaccine (1 of 2) Never done   Colon Cancer Screening  05/01/2022   Screening for Lung Cancer  03/12/2024   Medicare Annual Wellness Visit  09/09/2024   Cologuard (Stool DNA test)  04/22/2025   Pneumonia Vaccine (3 of 3 - PPSV23 or PCV20) 10/11/2025   Flu Shot  Completed   COVID-19 Vaccine  Completed   Hepatitis C Screening  Completed   HPV Vaccine  Aged Out    Advanced directives: (In Chart) A copy of your advanced directives are scanned into your chart should your provider ever need it.  Next Medicare Annual Wellness Visit scheduled for next year: Yes

## 2023-09-12 DIAGNOSIS — J9610 Chronic respiratory failure, unspecified whether with hypoxia or hypercapnia: Secondary | ICD-10-CM | POA: Diagnosis not present

## 2023-09-12 DIAGNOSIS — J449 Chronic obstructive pulmonary disease, unspecified: Secondary | ICD-10-CM | POA: Diagnosis not present

## 2023-09-19 DIAGNOSIS — J9610 Chronic respiratory failure, unspecified whether with hypoxia or hypercapnia: Secondary | ICD-10-CM | POA: Diagnosis not present

## 2023-09-19 DIAGNOSIS — J449 Chronic obstructive pulmonary disease, unspecified: Secondary | ICD-10-CM | POA: Diagnosis not present

## 2023-09-26 DIAGNOSIS — J9611 Chronic respiratory failure with hypoxia: Secondary | ICD-10-CM | POA: Diagnosis not present

## 2023-10-10 DIAGNOSIS — J449 Chronic obstructive pulmonary disease, unspecified: Secondary | ICD-10-CM | POA: Diagnosis not present

## 2023-10-10 DIAGNOSIS — Z515 Encounter for palliative care: Secondary | ICD-10-CM | POA: Diagnosis not present

## 2023-10-20 DIAGNOSIS — J9610 Chronic respiratory failure, unspecified whether with hypoxia or hypercapnia: Secondary | ICD-10-CM | POA: Diagnosis not present

## 2023-10-20 DIAGNOSIS — J449 Chronic obstructive pulmonary disease, unspecified: Secondary | ICD-10-CM | POA: Diagnosis not present

## 2023-10-21 ENCOUNTER — Encounter: Payer: Self-pay | Admitting: Internal Medicine

## 2023-10-21 ENCOUNTER — Ambulatory Visit (INDEPENDENT_AMBULATORY_CARE_PROVIDER_SITE_OTHER): Payer: Medicare Other | Admitting: Internal Medicine

## 2023-10-21 VITALS — BP 135/73 | HR 98 | Ht 69.0 in | Wt 154.6 lb

## 2023-10-21 DIAGNOSIS — J9611 Chronic respiratory failure with hypoxia: Secondary | ICD-10-CM

## 2023-10-21 DIAGNOSIS — I739 Peripheral vascular disease, unspecified: Secondary | ICD-10-CM | POA: Diagnosis not present

## 2023-10-21 DIAGNOSIS — E782 Mixed hyperlipidemia: Secondary | ICD-10-CM

## 2023-10-21 DIAGNOSIS — F411 Generalized anxiety disorder: Secondary | ICD-10-CM

## 2023-10-21 DIAGNOSIS — J439 Emphysema, unspecified: Secondary | ICD-10-CM | POA: Diagnosis not present

## 2023-10-21 DIAGNOSIS — J9612 Chronic respiratory failure with hypercapnia: Secondary | ICD-10-CM | POA: Diagnosis not present

## 2023-10-21 DIAGNOSIS — I2510 Atherosclerotic heart disease of native coronary artery without angina pectoris: Secondary | ICD-10-CM

## 2023-10-21 DIAGNOSIS — Z0001 Encounter for general adult medical examination with abnormal findings: Secondary | ICD-10-CM | POA: Diagnosis not present

## 2023-10-21 NOTE — Assessment & Plan Note (Addendum)
His right toe discoloration is concerning for PAD considering his history of CAD DPA pulse on RLE diminished today, would benefit from toe brachial index Advised to discuss with vascular surgery On aspirin and statin currently

## 2023-10-21 NOTE — Assessment & Plan Note (Signed)
Well-controlled currently with Breztri Albuterol PRN F/u with Pulmonology

## 2023-10-21 NOTE — Assessment & Plan Note (Signed)
On statin Check lipid profile

## 2023-10-21 NOTE — Assessment & Plan Note (Signed)
S/p stent placement in 2011 and 2015 On aspirin and statin Did not tolerate beta-blocker in the past Follows up with cardiology

## 2023-10-21 NOTE — Patient Instructions (Addendum)
Please continue to take medications as prescribed.  Start taking Terbinafine as prescribed for 12 weeks.  Please continue to follow low salt diet.  Please contact Dr Karin Lieu' office for evaluation of PAD - [phone].

## 2023-10-21 NOTE — Progress Notes (Signed)
Established Patient Office Visit  Subjective:  Patient ID: John [name], male    DOB: May 26, 1956  Age: 68 y.o. MRN: [id number]  CC:  Chief Complaint  Patient presents with   COPD    Follow up   Annual Exam    HPI John [name] is a 68 y.o. male with past medical history of CAD s/p stent placement, chronic hypoxic respiratory failure due to COPD and chronic leg edema who presents for annual physical.  CAD and HTN: He has h/o CAD and follows up with Cardiology. BP is well-controlled. Takes medications regularly. Patient denies headache, dizziness, chest pain, or palpitations.   Chronic hypoxic respiratory failure due to COPD: He has been using home O2 - 3 lpm currently.  Denies any recent worsening of dyspnea, cough, or fatigue.  Denies any fever, chills, sore throat or nasal congestion.  He has been using Breztri and as needed albuterol for dyspnea.  Onychomycosis: He reports chronic thickening and yellowish discoloration of the right third toe nail.  He has mild pain around the toenail area as well.  He was given oral terbinafine, but he did not start it.  He agrees to take it now.  Of note, he also reports purplish discoloration of right foot toes upon sitting for few minutes.  Reports improvement in color of the toes upon leg elevation.  He also reports sharp pain in the toes when he has noticed purplish discoloration.  He has limited ambulation due to chronic hypoxic respiratory failure. He has not contacted vascular surgery yet.     Past Medical History:  Diagnosis Date   Alcohol abuse 12/27/2010   Allergic rhinitis, cause unspecified 12/27/2010   Anal warts 04/11/2012   CAD, NATIVE VESSEL 10/04/2010   COPD (chronic obstructive pulmonary disease) (HCC)    "a touch" (09/28/2013)   History of blood transfusion 03/2012   related to nose bleed   HYPERLIPIDEMIA-MIXED 10/04/2010   HYPERTENSION, BENIGN 10/04/2010   Myocardial infarction (HCC) 09/16/2010   Pneumonia    "when I  was a kid"   Stented coronary artery    Mid LAD    TOBACCO ABUSE 10/04/2010    Past Surgical History:  Procedure Laterality Date   ABDOMINAL AORTIC ENDOVASCULAR STENT GRAFT N/A 07/28/2022   Procedure: ABDOMINAL AORTIC ENDOVASCULAR STENT GRAFT;  Surgeon: Victorino Sparrow, MD;  Location: [facility] OR;  Service: Vascular;  Laterality: N/A;   CARDIAC CATHETERIZATION  ~ 2012   CORONARY ANGIOPLASTY WITH STENT PLACEMENT  09/17/2010; 09/28/2014   "1; 2"   FINGER SURGERY Left    "almost cut off" tip of 2nd digit   LEFT HEART CATHETERIZATION WITH CORONARY ANGIOGRAM N/A 09/28/2014   Procedure: LEFT HEART CATHETERIZATION WITH CORONARY ANGIOGRAM;  Surgeon: Kathleene Hazel, MD;  Location: [facility] CATH LAB;  Service: Cardiovascular;  Laterality: N/A;   NASAL ENDOSCOPY WITH EPISTAXIS CONTROL Bilateral 03/2012   ULTRASOUND GUIDANCE FOR VASCULAR ACCESS N/A 07/28/2022   Procedure: ULTRASOUND GUIDANCE FOR VASCULAR ACCESS;  Surgeon: Victorino Sparrow, MD;  Location: [facility] OR;  Service: Vascular;  Laterality: N/A;   VASECTOMY      Family History  Problem Relation Age of Onset   Brain cancer Mother    Heart disease Father     Social History   Socioeconomic History   Marital status: Single    Spouse name: Not on file   Number of children: Not on file   Years of education: Not on file   Highest education level: Not on  file  Occupational History   Occupation: Geographical information systems officer work     Associate Professor: [employer]    Comment: Ronette Deter  Tobacco Use   Smoking status: Former    Current packs/day: 0.00    Average packs/day: 2.0 packs/day for 51.6 years (103.1 ttl pk-yrs)    Types: Cigarettes    Start date: 69    Quit date: 04/18/2020    Years since quitting: 3.5   Smokeless tobacco: Never   Tobacco comments:    2 plus packs per day. He has a 100 + pack - year history of tobacco abuse currently. Former 4 ppd for 25 years.  Vaping Use   Vaping status: Never Used  Substance and Sexual Activity   Alcohol use:  Yes    Alcohol/week: 144.0 standard drinks of alcohol    Types: 144 Cans of beer per week    Comment:  history 12 pack of beer per night".  2021- drinks 3 beer/day   Drug use: No   Sexual activity: Yes    Birth control/protection: None  Other Topics Concern   Not on file  Social History Narrative   The patient lives in [place] with his girlfriend. 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   Social Drivers of Corporate investment banker Strain: Low Risk  (09/10/2023)   Overall Financial Resource Strain (CARDIA)    Difficulty of Paying Living Expenses: Not hard at all  Food Insecurity: No Food Insecurity (09/10/2023)   Hunger Vital Sign    Worried About Running Out of Food in the Last Year: Never true    Ran Out of Food in the Last Year: Never true  Transportation Needs: No Transportation Needs (09/10/2023)   PRAPARE - Administrator, Civil Service (Medical): No    Lack of Transportation (Non-Medical): No  Physical Activity: Inactive (09/10/2023)   Exercise Vital Sign    Days of Exercise per Week: 0 days    Minutes of Exercise per Session: 0 min  Stress: No Stress Concern Present (09/10/2023)   [employer] of Occupational Health - Occupational Stress Questionnaire    Feeling of Stress : Not at all  Social Connections: Moderately Integrated (09/10/2023)   Social Connection and Isolation Panel [NHANES]    Frequency of Communication with Friends and Family: More than three times a week    Frequency of Social Gatherings with Friends and Family: Three times a week    Attends Religious Services: Never    Active Member of Clubs or Organizations: Yes    Attends Banker Meetings: Never    Marital Status: Living with partner  Intimate Partner Violence: Not At Risk (09/10/2023)   Humiliation, Afraid, Rape, and Kick questionnaire    Fear of Current or Ex-Partner: No    Emotionally Abused: No    Physically Abused: No    Sexually  Abused: No    Outpatient Medications Prior to Visit  Medication Sig Dispense Refill   albuterol (PROAIR HFA) 108 (90 Base) MCG/ACT inhaler Inhale 2 puffs into the lungs every 4 (four) hours as needed for wheezing or shortness of breath. 2 puffs every 4 hours as needed only  if your can't catch your breath 8 g 5   albuterol (PROVENTIL) (2.5 MG/3ML) 0.083% nebulizer solution Take 3 mLs (2.5 mg total) by nebulization every 4 (four) hours as needed for wheezing or shortness of breath. 360 mL 5   ALPRAZolam (XANAX) 0.25 MG  tablet Take 1 tablet (0.25 mg total) by mouth 3 (three) times daily as needed for anxiety. 60 tablet 5   aspirin EC 81 MG tablet Take 81 mg by mouth daily.     atorvastatin (LIPITOR) 40 MG tablet Take 1 tablet (40 mg total) by mouth daily. 90 tablet 3   Budeson-Glycopyrrol-Formoterol (BREZTRI AEROSPHERE) 160-9-4.8 MCG/ACT AERO Inhale 2 puffs into the lungs in the morning and at bedtime. 10.7 g 11   fluticasone (FLONASE) 50 MCG/ACT nasal spray Place 1 spray into both nostrils daily. 16 g 2   furosemide (LASIX) 40 MG tablet Take 1 tablet (40 mg total) by mouth daily. 90 tablet 3   guaiFENesin (MUCINEX) 600 MG 12 hr tablet Take 2 tablets (1,200 mg total) by mouth 2 (two) times daily as needed for cough or to loosen phlegm.     Multiple Vitamin (MULTIVITAMIN WITH MINERALS) TABS tablet Take 1 tablet by mouth daily.     nitroGLYCERIN (NITROSTAT) 0.4 MG SL tablet Place 1 tablet (0.4 mg total) under the tongue every 5 (five) minutes as needed for chest pain. 25 tablet 6   OXYGEN Inhale 2-3 L into the lungs.     predniSONE (DELTASONE) 10 MG tablet Take 1 tablet (10 mg total) by mouth daily with breakfast. Take 4 tabs  daily with food x 4 days, then 3 tabs daily x 4 days, then 2 tabs daily x 4 days, then 1 tab daily x4 days then stop. #40 40 tablet 0   terbinafine (LAMISIL) 250 MG tablet Take 250 mg by mouth daily.     No facility-administered medications prior to visit.    Allergies   Allergen Reactions   Penicillins Other (See Comments)    Passes out    ROS Review of Systems  Constitutional:  Negative for chills and fever.  HENT:  Negative for congestion and sore throat.   Eyes:  Negative for pain and discharge.  Respiratory:  Positive for cough and shortness of breath (Chronic).   Cardiovascular:  Negative for chest pain and palpitations.  Gastrointestinal:  Negative for diarrhea, nausea and vomiting.  Endocrine: Negative for polydipsia and polyuria.  Genitourinary:  Negative for dysuria and hematuria.  Musculoskeletal:  Negative for neck pain and neck stiffness.  Skin:  Positive for color change (Intermittent purplish discoloration of right toes). Negative for rash.  Neurological:  Negative for dizziness, weakness, numbness and headaches.  Psychiatric/Behavioral:  Negative for agitation and behavioral problems.       Objective:    Physical Exam Vitals reviewed.  Constitutional:      General: He is not in acute distress.    Appearance: He is not diaphoretic.     Comments: In electric scooter  HENT:     Head: Normocephalic and atraumatic.     Nose: Nose normal.     Mouth/Throat:     Mouth: Mucous membranes are moist.  Eyes:     General: No scleral icterus.    Extraocular Movements: Extraocular movements intact.  Cardiovascular:     Rate and Rhythm: Normal rate and regular rhythm.     Heart sounds: Normal heart sounds. No murmur heard.    Comments: Weak DPA pulse on right LE Pulmonary:     Breath sounds: Normal breath sounds. No wheezing or rales.     Comments: On 3 l O2 Abdominal:     Palpations: Abdomen is soft.     Tenderness: There is no abdominal tenderness.  Musculoskeletal:     Cervical back:  Neck supple. No tenderness.     Right lower leg: No edema.     Left lower leg: No edema.  Feet:     Right foot:     Toenail Condition: Right toenails are abnormally thick. Fungal disease present. Skin:    General: Skin is warm.     Findings:  No rash.     Comments: Erythema of right toes  Neurological:     General: No focal deficit present.     Mental Status: He is alert and oriented to person, place, and time.     Sensory: No sensory deficit.     Motor: No weakness.  Psychiatric:        Mood and Affect: Mood normal.        Behavior: Behavior normal.     BP 135/73 (BP Location: Left Arm, Patient Position: Sitting, Cuff Size: Normal)   Pulse 98   Ht 5\' 9"  (1.753 m)   Wt 154 lb 9.6 oz (70.1 kg)   SpO2 (!) 88%   BMI 22.83 kg/m  Wt Readings from Last 3 Encounters:  10/21/23 154 lb 9.6 oz (70.1 kg)  09/10/23 154 lb (69.9 kg)  07/23/23 154 lb 6.4 oz (70 kg)    Lab Results  Component Value Date   TSH 2.650 10/14/2022   Lab Results  Component Value Date   WBC 8.3 10/21/2023   HGB 12.9 (L) 10/21/2023   HCT 39.5 10/21/2023   MCV 99 (H) 10/21/2023   PLT 198 10/21/2023   Lab Results  Component Value Date   NA 145 (H) 10/21/2023   K 4.5 10/21/2023   CO2 38 (H) 10/21/2023   GLUCOSE 101 (H) 10/21/2023   BUN 17 10/21/2023   CREATININE 1.12 10/21/2023   BILITOT 0.3 10/21/2023   ALKPHOS 154 (H) 10/21/2023   AST 30 10/21/2023   ALT 24 10/21/2023   PROT 6.5 10/21/2023   ALBUMIN 4.1 10/21/2023   CALCIUM 9.5 10/21/2023   ANIONGAP 10 07/30/2022   EGFR 72 10/21/2023   GFR 92.34 06/14/2020   Lab Results  Component Value Date   CHOL 178 10/21/2023   Lab Results  Component Value Date   HDL 86 10/21/2023   Lab Results  Component Value Date   LDLCALC 81 10/21/2023   Lab Results  Component Value Date   TRIG 57 10/21/2023   Lab Results  Component Value Date   CHOLHDL 2.1 10/21/2023   Lab Results  Component Value Date   HGBA1C 5.1 04/24/2020      Assessment & Plan:   Problem List Items Addressed This Visit       Cardiovascular and Mediastinum   CAD, NATIVE VESSEL   S/p stent placement in 2011 and 2015 On aspirin and statin Did not tolerate beta-blocker in the past Follows up with cardiology       Relevant Orders   CBC with Differential/Platelet (Completed)   CMP14+EGFR (Completed)   Lipid Profile (Completed)   PAD (peripheral artery disease) (HCC)   His right toe discoloration is concerning for PAD considering his history of CAD DPA pulse on RLE diminished today, would benefit from toe brachial index Advised to discuss with vascular surgery On aspirin and statin currently        Respiratory   Chronic respiratory failure with hypoxia and hypercapnia (HCC)   On 3 L O2 at home Due to underlying COPD On Breztri Albuterol nebs and inhaler as needed Follows up with Dr. Vassie Loll Had evaluation by transplant team, not  a surgical candidate      Relevant Orders   CBC with Differential/Platelet (Completed)   CMP14+EGFR (Completed)   Chronic obstructive pulmonary disease (HCC)   Well-controlled currently with Breztri Albuterol PRN F/u with Pulmonology      Relevant Orders   CBC with Differential/Platelet (Completed)   CMP14+EGFR (Completed)     Other   HLD (hyperlipidemia)   On statin Check lipid profile      Encounter for general adult medical examination with abnormal findings - Primary   Physical exam as documented. Fasting blood tests ordered. Advised to get Shingrix and Tdap vaccine at local pharmacy.      GAD (generalized anxiety disorder)   Takes Xanax PRN, helps with hyperinflation as well      Relevant Orders   CBC with Differential/Platelet (Completed)   CMP14+EGFR (Completed)      No orders of the defined types were placed in this encounter.   Follow-up: Return in about 6 months (around 04/19/2024) for HTN and CAD.    Anabel Halon, MD

## 2023-10-21 NOTE — Assessment & Plan Note (Signed)
Physical exam as documented. Fasting blood tests ordered. Advised to get Shingrix and Tdap vaccine at local pharmacy.

## 2023-10-21 NOTE — Assessment & Plan Note (Signed)
On 3 L O2 at home Due to underlying COPD On Breztri Albuterol nebs and inhaler as needed Follows up with Dr. Vassie Loll Had evaluation by transplant team, not a surgical candidate

## 2023-10-21 NOTE — Assessment & Plan Note (Signed)
Takes Xanax PRN, helps with hyperinflation as well

## 2023-10-22 ENCOUNTER — Ambulatory Visit (HOSPITAL_BASED_OUTPATIENT_CLINIC_OR_DEPARTMENT_OTHER): Payer: Medicare Other | Admitting: Pulmonary Disease

## 2023-10-22 ENCOUNTER — Encounter (HOSPITAL_BASED_OUTPATIENT_CLINIC_OR_DEPARTMENT_OTHER): Payer: Self-pay | Admitting: Pulmonary Disease

## 2023-10-22 VITALS — BP 126/70 | HR 98 | Ht 69.0 in | Wt 154.0 lb

## 2023-10-22 DIAGNOSIS — J9611 Chronic respiratory failure with hypoxia: Secondary | ICD-10-CM

## 2023-10-22 DIAGNOSIS — J439 Emphysema, unspecified: Secondary | ICD-10-CM

## 2023-10-22 DIAGNOSIS — J9612 Chronic respiratory failure with hypercapnia: Secondary | ICD-10-CM

## 2023-10-22 DIAGNOSIS — J4489 Other specified chronic obstructive pulmonary disease: Secondary | ICD-10-CM

## 2023-10-22 LAB — CBC WITH DIFFERENTIAL/PLATELET
Basophils Absolute: 0.1 10*3/uL (ref 0.0–0.2)
Basos: 1 %
EOS (ABSOLUTE): 1.1 10*3/uL — ABNORMAL HIGH (ref 0.0–0.4)
Eos: 13 %
Hematocrit: 39.5 % (ref 37.5–51.0)
Hemoglobin: 12.9 g/dL — ABNORMAL LOW (ref 13.0–17.7)
Immature Grans (Abs): 0 10*3/uL (ref 0.0–0.1)
Immature Granulocytes: 0 %
Lymphocytes Absolute: 0.9 10*3/uL (ref 0.7–3.1)
Lymphs: 11 %
MCH: 32.2 pg (ref 26.6–33.0)
MCHC: 32.7 g/dL (ref 31.5–35.7)
MCV: 99 fL — ABNORMAL HIGH (ref 79–97)
Monocytes Absolute: 0.7 10*3/uL (ref 0.1–0.9)
Monocytes: 8 %
Neutrophils Absolute: 5.5 10*3/uL (ref 1.4–7.0)
Neutrophils: 67 %
Platelets: 198 10*3/uL (ref 150–450)
RBC: 4.01 x10E6/uL — ABNORMAL LOW (ref 4.14–5.80)
RDW: 12.4 % (ref 11.6–15.4)
WBC: 8.3 10*3/uL (ref 3.4–10.8)

## 2023-10-22 LAB — CMP14+EGFR
ALT: 24 [IU]/L (ref 0–44)
AST: 30 [IU]/L (ref 0–40)
Albumin: 4.1 g/dL (ref 3.9–4.9)
Alkaline Phosphatase: 154 [IU]/L — ABNORMAL HIGH (ref 44–121)
BUN/Creatinine Ratio: 15 (ref 10–24)
BUN: 17 mg/dL (ref 8–27)
Bilirubin Total: 0.3 mg/dL (ref 0.0–1.2)
CO2: 38 mmol/L — ABNORMAL HIGH (ref 20–29)
Calcium: 9.5 mg/dL (ref 8.6–10.2)
Chloride: 96 mmol/L (ref 96–106)
Creatinine, Ser: 1.12 mg/dL (ref 0.76–1.27)
Globulin, Total: 2.4 g/dL (ref 1.5–4.5)
Glucose: 101 mg/dL — ABNORMAL HIGH (ref 70–99)
Potassium: 4.5 mmol/L (ref 3.5–5.2)
Sodium: 145 mmol/L — ABNORMAL HIGH (ref 134–144)
Total Protein: 6.5 g/dL (ref 6.0–8.5)
eGFR: 72 mL/min/{1.73_m2} (ref >59)

## 2023-10-22 LAB — LIPID PANEL
Chol/HDL Ratio: 2.1 {ratio} (ref 0.0–5.0)
Cholesterol, Total: 178 mg/dL (ref 100–199)
HDL: 86 mg/dL (ref >39)
LDL Chol Calc (NIH): 81 mg/dL (ref 0–99)
Triglycerides: 57 mg/dL (ref 0–149)
VLDL Cholesterol Cal: 11 mg/dL (ref 5–40)

## 2023-10-22 MED ORDER — ATORVASTATIN CALCIUM 80 MG PO TABS: 80.0000 mg | ORAL_TABLET | Freq: Every day | ORAL | 3 refills | Status: DC

## 2023-10-22 MED ORDER — ALBUTEROL SULFATE (2.5 MG/3ML) 0.083% IN NEBU: 2.5000 mg | INHALATION_SOLUTION | RESPIRATORY_TRACT | 5 refills | Status: DC | PRN

## 2023-10-22 NOTE — Patient Instructions (Signed)
X refill on albuterol nebs to [facility] @ [place] x 5  X Rx for new medication -Ohtuwayre nebs twice daily  Stop xanax for now

## 2023-10-22 NOTE — Progress Notes (Signed)
Subjective:    Patient ID: John [name], male    DOB: [birth year], 68 y.o.   MRN: [id number]  HPI  68 y.o. 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   -He is on 3 L oxygen in the daytime, 4 L on exertion, unable to use NIV during sleep but tries to use at least 4 hours in the daytime.  -100 pack-year smoking history , quit 2021 -assessment at [facility] in July 2022 -NOT a candidate for lung transplant due to coronary artery disease and infrarenal abdominal aortic aneurysm    Past Medical History:  PNA, HTN, HLD, CAD, Seizure, ETOH, infrarenal abdominal aortic aneurysm  Discussed the use of AI scribe software for clinical note transcription with the patient, who gave verbal consent to proceed.  History of Present Illness   The patient, with a history of severe end-stage COPD and chronic hypoxic hypercarbic respiratory failure, reports increased shortness of breath. He notes that this occurs even during activities such as talking or eating, to the point where he sometimes has to push his plate away. This is a change from his previous state. He also reports occasional wheezing, which is a new symptom for him. He has been using Breztri and albuterol, and reports that a course of prednisone in October was beneficial. He uses albuterol as needed, and reports that he has only needed to use it two or three times. He also uses a nebulizer more frequently when at home. The patient's spouse mentions that the patient has been prescribed Xanax to help with anxiety related to wearing the mask for NIV, but the patient decides to stop taking it after learning it could increase his carbon dioxide levels.        Significant tests/ events reviewed   Pulmonary testing:  PFT 12/09/19 >> FEV1 0.59 (16%), FEV1% 37, DLCO 27% A1AT 03/09/20 >> 110, MZ ABG 02/12/21 >> pH 7.28, PCO2 80.9, PO2 109 on 40% FiO2 PFT 04/11/21 >> FEV1  0.6 (18%), FEV1% 26, DLCO 21%   Chest Imaging:  CT angio chest 11/23/19 >> moderate centrilobular emphysema, mild infrahilar BTX, 6 mm nodule LUL CT chest 01/02/21 >> no change LUL nodule, new 1.1 cm LLL opacity CT chest 03/28/21 >> severe emphysema, 2.2 x 0.8 cm band like opacity RLL from ATX, mucus plugging, 3 mm nodule RUL, 1.6 x 0.7 cm nodularity LUL CT chest 04/09/21 >> diffuse centrilobular emphysema, stable nodule LUL, 8 mm nodule RLL CT chest 03/27/22 >> stable GGO LLL CT chest 03/17/23 >> stable GGO LLL  Review of Systems neg for any significant sore throat, dysphagia, itching, sneezing, nasal congestion or excess/ purulent secretions, fever, chills, sweats, unintended wt loss, pleuritic or exertional cp, hempoptysis, orthopnea pnd or change in chronic leg swelling. Also denies presyncope, palpitations, heartburn, abdominal pain, nausea, vomiting, diarrhea or change in bowel or urinary habits, dysuria,hematuria, rash, arthralgias, visual complaints, headache, numbness weakness or ataxia.     Objective:   Physical Exam  Gen. Pleasant, well-nourished, in no distress, in wheelchair, on [facility] - no thrush, no pallor/icterus,no post nasal drip Neck: No JVD, no thyromegaly, no carotid bruits Lungs: no use of accessory muscles, no dullness to percussion,decreased BL without rales or rhonchi  Cardiovascular: Rhythm regular, heart sounds  normal, no murmurs or gallops, no peripheral edema Musculoskeletal: No deformities, no cyanosis or clubbing        Assessment & Plan:    Assessment and Plan  Chronic Obstructive Pulmonary Disease (COPD) Severe end-stage COPD with chronic hypoxic hypercarbic respiratory failure. Reports increased dyspnea, especially during talking and eating. No cough or sputum production. Recent wheezing noted but not persistent. Currently on Breztri, albuterol, and uses a nebulizer. Previous prednisone course in October was effective. Discussed adding O2 Wear nebulizer  medication, which may improve symptoms via a different mechanism. Risks include potential cost and side effects, but benefits may include improved breathing and reduced albuterol use. Patient is willing to try O2 Wear, understanding it may not provide instant relief but could improve overall breathing quality and reduce albuterol dependency. - Prescribe Ohtuwayre nebulizer medication twice daily - Continue Breztri twice daily - Continue albuterol as needed - Start paperwork for prescription - If no response from insurance by February 10, call the office  Chronic Hypoxic Hypercarbic Respiratory Failure On continuous oxygen therapy and non-invasive ventilation (NIV) for at least five hours daily. Cannot use NIV during sleep due to discomfort and claustrophobia. Reports that NIV helps reduce heart strain when used consistently. Discussed the importance of continued use despite discomfort. Patient's goals include avoiding hospitalization and managing symptoms at home. - Continue oxygen therapy - Continue NIV for at least five hours daily  Anxiety Related to Respiratory Distress Experiences anxiety related to breathing difficulties, especially when using the NIV mask. Previously prescribed Xanax for anxiety, but it may increase CO2 levels. Agrees to discontinue Xanax unless anxiety becomes unmanageable. Discussed potential need for anxiety management if symptoms worsen. - Discontinue Xanax - Monitor anxiety levels and contact the office if anxiety becomes unmanageable  Follow-up - Follow up if no response from insurance by February 10 - Monitor response to O2 Wear and adjust treatment as necessary.

## 2023-10-22 NOTE — Addendum Note (Signed)
Addended byTrena Platt on: 10/22/2023 07:57 AM   Modules accepted: Orders

## 2023-10-27 DIAGNOSIS — J9611 Chronic respiratory failure with hypoxia: Secondary | ICD-10-CM | POA: Diagnosis not present

## 2023-10-29 ENCOUNTER — Telehealth: Payer: Self-pay | Admitting: Pharmacist

## 2023-10-29 NOTE — Telephone Encounter (Signed)
 Received Ohtuvayre  new start paperwork. Completed forms and faxed to [employer] with insurance card copy and clinicals  Phone#: [phone] Fax#: [phone]  Sherry Pennant, PharmD, MPH, BCPS, CPP Clinical Pharmacist (Rheumatology and Pulmonology)

## 2023-10-30 NOTE — Telephone Encounter (Signed)
 Received fax from [facility] stating that Ohtuvayre  referral was received. Rx has been triaged to [facility] (Phone: [phone]). Pharmacy will process benefits investigation and reach out to patient once ready to schedule shipment. If medication is unaffordable, patient will need to express financial hardship. Rx for Ohtuvayre  will be triaged back to [facility] at that point for prescreening for patient assistance program  Patient ID: [id number]  Sherry Pennant, PharmD, MPH, BCPS, CPP Clinical Pharmacist (Rheumatology and Pulmonology)

## 2023-11-14 ENCOUNTER — Telehealth: Payer: Self-pay | Admitting: Cardiovascular Disease

## 2023-11-14 MED ORDER — FUROSEMIDE 40 MG PO TABS: 40.0000 mg | ORAL_TABLET | Freq: Every day | ORAL | 2 refills | Status: DC

## 2023-11-14 NOTE — Telephone Encounter (Signed)
 Pt's medication was sent to pt's pharmacy as requested. Confirmation received.

## 2023-11-14 NOTE — Telephone Encounter (Signed)
*  STAT* If patient is at the pharmacy, call can be transferred to refill team.   1. Which medications need to be refilled? (please list name of each medication and dose if known) furosemide (LASIX) 40 MG tablet    2. Would you like to learn more about the convenience, safety, & potential cost savings by using the [facility]? NO     3. Are you open to using the [facility] NO   4. Which pharmacy/location (including street and city if local pharmacy) is medication to be sent to? [id number] - [place], [place] - [address] AT [address]    5. Do they need a 30 day or 90 day supply? 90

## 2023-11-20 DIAGNOSIS — J449 Chronic obstructive pulmonary disease, unspecified: Secondary | ICD-10-CM | POA: Diagnosis not present

## 2023-11-20 DIAGNOSIS — J9610 Chronic respiratory failure, unspecified whether with hypoxia or hypercapnia: Secondary | ICD-10-CM | POA: Diagnosis not present

## 2023-11-24 DIAGNOSIS — J9611 Chronic respiratory failure with hypoxia: Secondary | ICD-10-CM | POA: Diagnosis not present

## 2023-12-04 NOTE — Progress Notes (Unsigned)
 No chief complaint on file.  History of Present Illness: 68 yo male with history of CAD , HTN, hyperlipidemia, chronic diastolic CHF and severe COPD who is here today for cardiac follow up. He was admitted to [facility] December 2011 with a NSTEMI. Cardiac cath 09/17/10 and was found to have a severe stenosis of the LAD which was treated with a drug eluting stent. He was also found to have moderate disease in the RCA (70%) and Circumflex (70%) managed medically. Cardiac cath June 2012 with stable CAD. He  had 3 admissions in July 2013 with severe epistaxis causing hemorrhagic shock with tachycardia and drop in blood pressure. This was treated with packing and transfusion. His Plavix was stopped. He was admitted January 2016 with unstable angina. Cardiac cath January 2016 with severe mid LAD stenosis, severe mid Circumflex stenosis. I placed a drug eluting stent in the mid LAD and a drug eluting stent in the mid Circumflex. LV function normal. I saw him in May 2016 and he c/o fatigue and dyspnea. His Coreg was stopped and he had resolution of his symptoms. He missed follow up appointments in 2017-2020 due to lack of insurance and cost. I saw him February 2020 with c/o dyspnea and lower extremity edema. He had been off of all medications due to losing his job and having no money. He responded well to Lasix. He has established in our pulmonary office with Dr. Craige Cotta. He was admitted to [facility] July 2021 with acute respiratory failure secondary to COPD exacerbation and community acquired pneumonia and was intubated. He was euvolemic on admission per notes but developed LE edema during his hospital stay. He was discharged on Lasix 40 mg po BID. Echo 04/19/20 with LVEF=65-70%. No valve disease. He has stopped smoking. He is on supplemental O2 24 hours per day now. He was seen at [facility] July 2022 and told he was not a candidate for lung transplant. Cardiac cath at [facility] July 2022 with 60% mid LAD stenosis, patent  LAD stents, patent Circumflex stent. Echo July 2022 at [facility] with normal LV function and no valve disease. CTA abdomen at [facility] with 6.9 cm AAA. He was seen by Vascular surgery there and turned down for intervention. He was told he had 6 months to live and was made DNR and placed on palliative care. He was doing well overall when I saw him in September 2023. He was admitted to [facility] November 2023 with abdominal pain and his AAA had ruptured. He underwent endovascular aortic repair per Dr. Sherral Hammers. He has done well since then.   He is here today for follow up. The patient denies any chest pain, dyspnea, palpitations, lower extremity edema, orthopnea, PND, dizziness, near syncope or syncope.   Primary Care Physician: Anabel Halon, MD  Past Medical History:  Diagnosis Date   Alcohol abuse 12/27/2010   Allergic rhinitis, cause unspecified 12/27/2010   Anal warts 04/11/2012   CAD, NATIVE VESSEL 10/04/2010   COPD (chronic obstructive pulmonary disease) (HCC)    "a touch" (09/28/2013)   History of blood transfusion 03/2012   related to nose bleed   HYPERLIPIDEMIA-MIXED 10/04/2010   HYPERTENSION, BENIGN 10/04/2010   Myocardial infarction (HCC) 09/16/2010   Pneumonia    "when I was a kid"   Stented coronary artery    Mid LAD    TOBACCO ABUSE 10/04/2010    Past Surgical History:  Procedure Laterality Date   ABDOMINAL AORTIC ENDOVASCULAR STENT GRAFT N/A 07/28/2022   Procedure:  ABDOMINAL AORTIC ENDOVASCULAR STENT GRAFT;  Surgeon: Victorino Sparrow, MD;  Location: [facility] OR;  Service: Vascular;  Laterality: N/A;   CARDIAC CATHETERIZATION  ~ 2012   CORONARY ANGIOPLASTY WITH STENT PLACEMENT  09/17/2010; 09/28/2014   "1; 2"   FINGER SURGERY Left    "almost cut off" tip of 2nd digit   LEFT HEART CATHETERIZATION WITH CORONARY ANGIOGRAM N/A 09/28/2014   Procedure: LEFT HEART CATHETERIZATION WITH CORONARY ANGIOGRAM;  Surgeon: Kathleene Hazel, MD;  Location: [facility] CATH LAB;  Service: Cardiovascular;  Laterality: N/A;    NASAL ENDOSCOPY WITH EPISTAXIS CONTROL Bilateral 03/2012   ULTRASOUND GUIDANCE FOR VASCULAR ACCESS N/A 07/28/2022   Procedure: ULTRASOUND GUIDANCE FOR VASCULAR ACCESS;  Surgeon: Victorino Sparrow, MD;  Location: [facility] OR;  Service: Vascular;  Laterality: N/A;   VASECTOMY      Current Outpatient Medications  Medication Sig Dispense Refill   albuterol (PROAIR HFA) 108 (90 Base) MCG/ACT inhaler Inhale 2 puffs into the lungs every 4 (four) hours as needed for wheezing or shortness of breath. 2 puffs every 4 hours as needed only  if your can't catch your breath 8 g 5   albuterol (PROVENTIL) (2.5 MG/3ML) 0.083% nebulizer solution Take 3 mLs (2.5 mg total) by nebulization every 4 (four) hours as needed for wheezing or shortness of breath. 360 mL 5   ALPRAZolam (XANAX) 0.25 MG tablet Take 1 tablet (0.25 mg total) by mouth 3 (three) times daily as needed for anxiety. 60 tablet 5   aspirin EC 81 MG tablet Take 81 mg by mouth daily.     atorvastatin (LIPITOR) 80 MG tablet Take 1 tablet (80 mg total) by mouth daily. 90 tablet 3   Budeson-Glycopyrrol-Formoterol (BREZTRI AEROSPHERE) 160-9-4.8 MCG/ACT AERO Inhale 2 puffs into the lungs in the morning and at bedtime. 10.7 g 11   fluticasone (FLONASE) 50 MCG/ACT nasal spray Place 1 spray into both nostrils daily. 16 g 2   furosemide (LASIX) 40 MG tablet Take 1 tablet (40 mg total) by mouth daily. 90 tablet 2   guaiFENesin (MUCINEX) 600 MG 12 hr tablet Take 2 tablets (1,200 mg total) by mouth 2 (two) times daily as needed for cough or to loosen phlegm.     Multiple Vitamin (MULTIVITAMIN WITH MINERALS) TABS tablet Take 1 tablet by mouth daily.     nitroGLYCERIN (NITROSTAT) 0.4 MG SL tablet Place 1 tablet (0.4 mg total) under the tongue every 5 (five) minutes as needed for chest pain. 25 tablet 6   OXYGEN Inhale 2-3 L into the lungs.     terbinafine (LAMISIL) 250 MG tablet Take 250 mg by mouth daily.     No current facility-administered medications for this visit.     Allergies  Allergen Reactions   Penicillins Other (See Comments)    Passes out    Social History   Socioeconomic History   Marital status: Single    Spouse name: Not on file   Number of children: Not on file   Years of education: Not on file   Highest education level: Not on file  Occupational History   Occupation: Lobbyist maintenance work     Employer: [employer]    Comment: Ronette Deter  Tobacco Use   Smoking status: Former    Current packs/day: 0.00    Average packs/day: 2.0 packs/day for 51.6 years (103.1 ttl pk-yrs)    Types: Cigarettes    Start date: 72    Quit date: 04/18/2020    Years since quitting: 3.6  Smokeless tobacco: Never   Tobacco comments:    2 plus packs per day. He has a 100 + pack - year history of tobacco abuse currently. Former 4 ppd for 25 years.  Vaping Use   Vaping status: Never Used  Substance and Sexual Activity   Alcohol use: Yes    Alcohol/week: 144.0 standard drinks of alcohol    Types: 144 Cans of beer per week    Comment:  history 12 pack of beer per night".  2021- drinks 3 beer/day   Drug use: No   Sexual activity: Yes    Birth control/protection: None  Other Topics Concern   Not on file  Social History Narrative   The patient lives in [place] with his girlfriend. 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   Social Drivers of Corporate investment banker Strain: Low Risk  (09/10/2023)   Overall Financial Resource Strain (CARDIA)    Difficulty of Paying Living Expenses: Not hard at all  Food Insecurity: No Food Insecurity (09/10/2023)   Hunger Vital Sign    Worried About Running Out of Food in the Last Year: Never true    Ran Out of Food in the Last Year: Never true  Transportation Needs: No Transportation Needs (09/10/2023)   PRAPARE - Administrator, Civil Service (Medical): No    Lack of Transportation (Non-Medical): No  Physical Activity: Inactive (09/10/2023)   Exercise Vital  Sign    Days of Exercise per Week: 0 days    Minutes of Exercise per Session: 0 min  Stress: No Stress Concern Present (09/10/2023)   [employer] of Occupational Health - Occupational Stress Questionnaire    Feeling of Stress : Not at all  Social Connections: Moderately Integrated (09/10/2023)   Social Connection and Isolation Panel [NHANES]    Frequency of Communication with Friends and Family: More than three times a week    Frequency of Social Gatherings with Friends and Family: Three times a week    Attends Religious Services: Never    Active Member of Clubs or Organizations: Yes    Attends Banker Meetings: Never    Marital Status: Living with partner  Intimate Partner Violence: Not At Risk (09/10/2023)   Humiliation, Afraid, Rape, and Kick questionnaire    Fear of Current or Ex-Partner: No    Emotionally Abused: No    Physically Abused: No    Sexually Abused: No    Family History  Problem Relation Age of Onset   Brain cancer Mother    Heart disease Father     Review of Systems:  As stated in the HPI and otherwise negative.   There were no vitals taken for this visit.  Physical Examination: General: Well developed, well nourished, NAD  HEENT: OP clear, mucus membranes moist  SKIN: warm, dry. No rashes. Neuro: No focal deficits  Musculoskeletal: Muscle strength 5/5 all ext  Psychiatric: Mood and affect normal  Neck: No JVD, no carotid bruits, no thyromegaly, no lymphadenopathy.  Lungs:Clear bilaterally, no wheezes, rhonci, crackles Cardiovascular: Regular rate and rhythm. No murmurs, gallops or rubs. Abdomen:Soft. Bowel sounds present. Non-tender.  Extremities: No lower extremity edema. Pulses are 2 + in the bilateral DP/PT.  Echo 04/19/20:  1. Left ventricular ejection fraction, by estimation, is 65 to 70%. The  left ventricle has normal function. The left ventricle has no regional  wall motion abnormalities. Left ventricular diastolic parameters  were  normal.  2. Right ventricular systolic function is normal. The right ventricular  size is normal. There is normal pulmonary artery systolic pressure.   3. The mitral valve is normal in structure. No evidence of mitral valve  regurgitation. No evidence of mitral stenosis.   4. The aortic valve is tricuspid. Aortic valve regurgitation is not  visualized. No aortic stenosis is present.   5. The inferior vena cava is normal in size with greater than 50%  respiratory variability, suggesting right atrial pressure of 3 mmHg.   Echo, Cath reports from [facility] are in Care Everywhere  EKG:  EKG is *** ordered today. The ekg ordered today demonstrates   Recent Labs: 10/21/2023: ALT 24; BUN 17; Creatinine, Ser 1.12; Hemoglobin 12.9; Platelets 198; Potassium 4.5; Sodium 145   Lipid Panel    Component Value Date/Time   CHOL 178 10/21/2023 1341   TRIG 57 10/21/2023 1341   TRIG 82 08/15/2006 0929   HDL 86 10/21/2023 1341   CHOLHDL 2.1 10/21/2023 1341   CHOLHDL 2.3 10/10/2020 0845   VLDL 12 10/10/2020 0845   LDLCALC 81 10/21/2023 1341     Wt Readings from Last 3 Encounters:  10/22/23 69.9 kg  10/21/23 70.1 kg  09/10/23 69.9 kg    Assessment and Plan:   1. CAD without angina: Cath at [facility] in July 2022 during workup for possible lung transplant. Moderate mid LAD stenosis with patent LAD and Circumflex stents. LV function normal by echo July 2022 at [facility] with no valve disease. He does not tolerate beta blockers due to fatigue. He is DNR secondary to his advanced lung disease. He has no angina. No plans for future invasive cardiac testing. Continue ASA and statin.       2. Tobacco abuse, in remission: He is no longer smoking  3. HTN: BP is controlled. No changes today  4. Hyperlipidemia: LDL near goal in January 2024. Will not repeat lipids given his poor prognosis with advanced lung disease. LFTs normal in January 2024. Continue statin  5. Chronic diastolic CHF: LV function normal by  echo July 2022. Weight is stable. No volume overload on exam. Continue Lasix.   6. Dyspnea/COPD: He is followed in the pulmonary office. Now on continuous supplemental O2. Not a candidate for lung transplant. He is now DNR and palliative care is following  7. AAA: He was admitted to [facility] in November 2023 with rupture of his AAA and had endovascular repair per Dr. Sherral Hammers.   Labs/ tests ordered today include:  No orders of the defined types were placed in this encounter.  Disposition:   F/U with me in 6 months  Signed, Verne Carrow, MD 12/04/2023 10:10 AM    [facility] [address] [place], [place], [place]  [address] Phone: [phone]; Fax: [phone]

## 2023-12-05 ENCOUNTER — Encounter: Payer: Self-pay | Admitting: Cardiovascular Disease

## 2023-12-05 ENCOUNTER — Ambulatory Visit: Payer: Medicare Other | Attending: Cardiology | Admitting: Cardiovascular Disease

## 2023-12-05 VITALS — BP 122/52 | HR 103 | Ht 69.0 in | Wt 152.8 lb

## 2023-12-05 DIAGNOSIS — E7800 Pure hypercholesterolemia, unspecified: Secondary | ICD-10-CM | POA: Diagnosis not present

## 2023-12-05 DIAGNOSIS — I5032 Chronic diastolic (congestive) heart failure: Secondary | ICD-10-CM

## 2023-12-05 DIAGNOSIS — I2510 Atherosclerotic heart disease of native coronary artery without angina pectoris: Secondary | ICD-10-CM | POA: Diagnosis not present

## 2023-12-05 DIAGNOSIS — E782 Mixed hyperlipidemia: Secondary | ICD-10-CM

## 2023-12-05 DIAGNOSIS — I10 Essential (primary) hypertension: Secondary | ICD-10-CM

## 2023-12-05 MED ORDER — ATORVASTATIN CALCIUM 40 MG PO TABS
40.0000 mg | ORAL_TABLET | Freq: Every day | ORAL | 3 refills | Status: DC
Start: 2023-12-05 — End: 2024-06-03

## 2023-12-05 NOTE — Patient Instructions (Signed)
Medication Instructions:  No changes *If you need a refill on your cardiac medications before your next appointment, please call your pharmacy*   Lab Work: none   Testing/Procedures: none   Follow-Up: At [facility], you and your health needs are our priority.  As part of our continuing mission to provide you with exceptional heart care, we have created designated Provider Care Teams.  These Care Teams include your primary Cardiologist (physician) and Advanced Practice Providers (APPs -  Physician Assistants and Nurse Practitioners) who all work together to provide you with the care you need, when you need it.   Your next appointment:   6 month(s)  Provider:   Lauree Chandler, MD

## 2023-12-18 DIAGNOSIS — J449 Chronic obstructive pulmonary disease, unspecified: Secondary | ICD-10-CM | POA: Diagnosis not present

## 2023-12-18 DIAGNOSIS — J9610 Chronic respiratory failure, unspecified whether with hypoxia or hypercapnia: Secondary | ICD-10-CM | POA: Diagnosis not present

## 2024-01-16 DIAGNOSIS — J449 Chronic obstructive pulmonary disease, unspecified: Secondary | ICD-10-CM | POA: Diagnosis not present

## 2024-01-16 DIAGNOSIS — Z515 Encounter for palliative care: Secondary | ICD-10-CM | POA: Diagnosis not present

## 2024-01-16 DIAGNOSIS — J9610 Chronic respiratory failure, unspecified whether with hypoxia or hypercapnia: Secondary | ICD-10-CM | POA: Diagnosis not present

## 2024-01-18 DIAGNOSIS — J9610 Chronic respiratory failure, unspecified whether with hypoxia or hypercapnia: Secondary | ICD-10-CM | POA: Diagnosis not present

## 2024-01-26 ENCOUNTER — Encounter (HOSPITAL_BASED_OUTPATIENT_CLINIC_OR_DEPARTMENT_OTHER): Payer: Self-pay | Admitting: Pulmonary Disease

## 2024-01-26 ENCOUNTER — Ambulatory Visit (HOSPITAL_BASED_OUTPATIENT_CLINIC_OR_DEPARTMENT_OTHER): Payer: Medicare Other | Admitting: Pulmonary Disease

## 2024-01-26 VITALS — BP 136/84 | HR 77 | Ht 69.0 in | Wt 149.6 lb

## 2024-01-26 DIAGNOSIS — J4489 Other specified chronic obstructive pulmonary disease: Secondary | ICD-10-CM

## 2024-01-26 DIAGNOSIS — J9611 Chronic respiratory failure with hypoxia: Secondary | ICD-10-CM | POA: Diagnosis not present

## 2024-01-26 DIAGNOSIS — R911 Solitary pulmonary nodule: Secondary | ICD-10-CM

## 2024-01-26 NOTE — Progress Notes (Signed)
 Subjective:    Patient ID: John [name], male    DOB: Apr 15, 1956, 68 y.o.   MRN: [id number]  HPI  68 y.o. 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    -He is on 3 L oxygen  in the daytime, 4 L on exertion, unable to use NIV during sleep but tries to use at least 4 hours in the daytime.  -100 pack-year smoking history , quit 2021 -assessment at [facility] in July 2022 -NOT a candidate for lung transplant due to coronary artery disease and infrarenal abdominal aortic aneurysm    Past Medical History:  PNA, HTN, HLD, CAD, Seizure, ETOH, infrarenal abdominal aortic aneurysm  Chief Complaint  Patient presents with   Follow-up    Breathing is slightly worse since the last visit. He has occ chest tightness.    presents with worsening dyspnea and medication management issues.  He experiences worsening dyspnea, becoming winded with minimal exertion such as moving from one room to another, and chest tightness, requiring rest to recover his breath. His breathing has progressively worsened, leading him to primarily stay indoors to avoid exacerbations.  He uses Breztri  and albuterol  for COPD management and takes Lasix  daily for fluid management, with an extra dose if his ankles swell. There is concern about the cost of a new medication, Ohtuwayre quoted at $500 per month after insurance, which he has not pursued due to financial constraints.  He primarily stays indoors, engaging in minimal activities like watching TV, going to the bathroom, or stepping onto the back porch. He wants to increase his activity level, mentioning interests in working on his car and truck, activities he used to enjoy.  He has a history of an aneurysm that was repaired after it burst, which previously affected his eligibility for a lung transplant. He has not participated in a pulmonary rehabilitation program.   Significant tests/  events reviewed   Pulmonary testing:  PFT 12/09/19 >> FEV1 0.59 (16%), FEV1% 37, DLCO 27% A1AT 03/09/20 >> 110, MZ ABG 02/12/21 >> pH 7.28, PCO2 80.9, PO2 109 on 40% FiO2 PFT 04/11/21 >> FEV1 0.6 (18%), FEV1% 26, DLCO 21%   Chest Imaging:  CT angio chest 11/23/19 >> moderate centrilobular emphysema, mild infrahilar BTX, 6 mm nodule LUL CT chest 01/02/21 >> no change LUL nodule, new 1.1 cm LLL opacity CT chest 03/28/21 >> severe emphysema, 2.2 x 0.8 cm band like opacity RLL from ATX, mucus plugging, 3 mm nodule RUL, 1.6 x 0.7 cm nodularity LUL CT chest 04/09/21 >> diffuse centrilobular emphysema, stable nodule LUL, 8 mm nodule RLL CT chest 03/27/22 >> stable GGO LLL CT chest 03/17/23 >> stable GGO LLL  Review of Systems neg for any significant sore throat, dysphagia, itching, sneezing, nasal congestion or excess/ purulent secretions, fever, chills, sweats, unintended wt loss, pleuritic or exertional cp, hempoptysis, orthopnea pnd or change in chronic leg swelling. Also denies presyncope, palpitations, heartburn, abdominal pain, nausea, vomiting, diarrhea or change in bowel or urinary habits, dysuria,hematuria, rash, arthralgias, visual complaints, headache, numbness weakness or ataxia.     Objective:   Physical Exam   Gen. Pleasant, well-nourished, in no distress, on motorised wheelchair ENT - no thrush, no pallor/icterus,no post nasal drip Neck: No JVD, no thyromegaly, no carotid bruits Lungs: no use of accessory muscles, no dullness to percussion, decreased without rales or rhonchi  Cardiovascular: Rhythm regular, heart sounds  normal, no murmurs or gallops, no peripheral edema Musculoskeletal: No  deformities, no cyanosis or clubbing         Assessment & Plan:    Severe COPD with chronic hypoxic respiratory failure Severe COPD with chronic hypoxic respiratory failure, experiencing increased dyspnea and chest tightness with minimal exertion. Continues Breztri  and albuterol . Discussed  potential benefit of a new nebulizer medication with a different mechanism of action. He expressed concern about the $500 cost after insurance, and the possibility of an assistance program was discussed. Generalized skin dryness is present, not attributed to current medications. Lung transplant was previously considered but not pursued due to aneurysm history and other factors. Discussed potential benefits and risks of lung transplant, including limited life expectancy post-transplant (typically five to ten years) and physical demands of recovery, including increased activity. - Encourage contact with the assistance program for the new nebulizer medication, ohtuwayre. - Continue Breztri  and albuterol  as prescribed. - Encourage participation in pulmonary rehabilitation to improve conditioning and activity tolerance. - Advise maintaining activity levels as tolerated to improve conditioning. -continue Breztri  & alb nebs - Instruct to contact the clinic if symptoms worsen or if a respiratory infection occurs.

## 2024-01-26 NOTE — Patient Instructions (Signed)
 X call assistance program fro Ohtuwayre  X pulm rehab @ [place]

## 2024-01-30 ENCOUNTER — Encounter (HOSPITAL_COMMUNITY)
Admission: RE | Admit: 2024-01-30 | Discharge: 2024-01-30 | Disposition: A | Discharge status: Home / Self Care | Source: Ambulatory Visit | Attending: Pulmonary Disease | Admitting: Pulmonary Disease

## 2024-01-30 DIAGNOSIS — J439 Emphysema, unspecified: Secondary | ICD-10-CM | POA: Insufficient documentation

## 2024-01-30 DIAGNOSIS — J4489 Other specified chronic obstructive pulmonary disease: Secondary | ICD-10-CM | POA: Insufficient documentation

## 2024-01-30 NOTE — Progress Notes (Signed)
 Completed virtual orientation today in person as he came to see program prior to signing up.  EP evaluation is scheduled for Monday May 12 at 1300 .  Documentation for diagnosis can be found in [facility] encounter 01/26/24.

## 2024-02-02 ENCOUNTER — Encounter (HOSPITAL_COMMUNITY)

## 2024-02-02 ENCOUNTER — Encounter (HOSPITAL_COMMUNITY)
Admission: RE | Admit: 2024-02-02 | Discharge: 2024-02-02 | Disposition: A | Discharge status: Home / Self Care | Source: Ambulatory Visit | Attending: Pulmonary Disease | Admitting: Pulmonary Disease

## 2024-02-02 VITALS — Ht 69.0 in | Wt 149.6 lb

## 2024-02-02 DIAGNOSIS — J4489 Other specified chronic obstructive pulmonary disease: Secondary | ICD-10-CM | POA: Diagnosis not present

## 2024-02-02 DIAGNOSIS — J439 Emphysema, unspecified: Secondary | ICD-10-CM | POA: Diagnosis not present

## 2024-02-02 NOTE — Progress Notes (Signed)
 Pulmonary Individual Treatment Plan  Patient Details  Name: John [name] MRN: [id number] Date of Birth: September 24, 1955 Referring Provider:   Flowsheet Row PULMONARY REHAB COPD ORIENTATION from 02/02/2024 in [facility] CARDIAC REHABILITATION  Referring Provider Celene Coins MD       Initial Encounter Date:  Flowsheet Row PULMONARY REHAB COPD ORIENTATION from 02/02/2024 in [facility] CARDIAC REHABILITATION  Date 02/02/24       Visit Diagnosis: Pulmonary emphysema, unspecified emphysema type (HCC)  Chronic bronchitis with COPD (chronic obstructive pulmonary disease) (HCC)  Patient's Home Medications on Admission:   Current Outpatient Medications:    albuterol  (PROAIR  HFA) 108 (90 Base) MCG/ACT inhaler, Inhale 2 puffs into the lungs every 4 (four) hours as needed for wheezing or shortness of breath. 2 puffs every 4 hours as needed only  if your can't catch your breath, Disp: 8 g, Rfl: 5   albuterol  (PROVENTIL ) (2.5 MG/3ML) 0.083% nebulizer solution, Take 3 mLs (2.5 mg total) by nebulization every 4 (four) hours as needed for wheezing or shortness of breath., Disp: 360 mL, Rfl: 5   aspirin  EC 81 MG tablet, Take 81 mg by mouth daily., Disp: , Rfl:    atorvastatin  (LIPITOR ) 40 MG tablet, Take 1 tablet (40 mg total) by mouth daily., Disp: 90 tablet, Rfl: 3   Budeson-Glycopyrrol-Formoterol  (BREZTRI  AEROSPHERE) 160-9-4.8 MCG/ACT AERO, Inhale 2 puffs into the lungs in the morning and at bedtime., Disp: 10.7 g, Rfl: 11   furosemide  (LASIX ) 40 MG tablet, Take 1 tablet (40 mg total) by mouth daily., Disp: 90 tablet, Rfl: 2   guaiFENesin  (MUCINEX ) 600 MG 12 hr tablet, Take 2 tablets (1,200 mg total) by mouth 2 (two) times daily as needed for cough or to loosen phlegm., Disp: , Rfl:    Multiple Vitamin (MULTIVITAMIN WITH MINERALS) TABS tablet, Take 1 tablet by mouth daily., Disp: , Rfl:    nitroGLYCERIN  (NITROSTAT ) 0.4 MG SL tablet, Place 1 tablet (0.4 mg total) under the tongue every 5 (five)  minutes as needed for chest pain., Disp: 25 tablet, Rfl: 6   OXYGEN , Inhale 2-3 L into the lungs., Disp: , Rfl:   Past Medical History: Past Medical History:  Diagnosis Date   Alcohol abuse 12/27/2010   Allergic rhinitis, cause unspecified 12/27/2010   Anal warts 04/11/2012   CAD, NATIVE VESSEL 10/04/2010   COPD (chronic obstructive pulmonary disease) (HCC)    "a touch" (09/28/2013)   History of blood transfusion 03/2012   related to nose bleed   HYPERLIPIDEMIA-MIXED 10/04/2010   HYPERTENSION, BENIGN 10/04/2010   Myocardial infarction (HCC) 09/16/2010   Pneumonia    "when I was a kid"   Stented coronary artery    Mid LAD    TOBACCO ABUSE 10/04/2010    Tobacco Use: Social History   Tobacco Use  Smoking Status Former   Current packs/day: 0.00   Average packs/day: 2.0 packs/day for 51.6 years (103.1 ttl pk-yrs)   Types: Cigarettes   Start date: 61   Quit date: 04/18/2020   Years since quitting: 3.7  Smokeless Tobacco Never  Tobacco Comments   2 plus packs per day. He has a 100 + pack - year history of tobacco abuse currently. Former 4 ppd for 25 years.    Labs: Review Flowsheet  More data exists      Latest Ref Rng & Units 02/12/2021 03/22/2022 07/28/2022 10/14/2022 10/21/2023  Labs for ITP Cardiac and Pulmonary Rehab  Cholestrol 100 - 199 mg/dL - 119  - 147  829  LDL (calc) 0 - 99 mg/dL - 76  - 72  81   HDL-C >39 mg/dL - 77  - 76  86   Trlycerides 0 - 149 mg/dL - 70  - 55  57   PH, Arterial 7.35 - 7.45 7.283  - 7.265  - -  PCO2 arterial 32 - 48 mmHg 80.9  - 80.0  - -  Bicarbonate 20.0 - 28.0 mmol/L 31.4  - 36.7  - -  TCO2 22 - 32 mmol/L - - 39  - -  O2 Saturation % 97.2  - 97  - -    Capillary Blood Glucose: Lab Results  Component Value Date   GLUCAP 185 (H) 04/25/2020   GLUCAP 80 04/25/2020   GLUCAP 80 04/25/2020   GLUCAP 131 (H) 04/24/2020   GLUCAP 176 (H) 04/24/2020     Pulmonary Assessment Scores:  Pulmonary Assessment Scores     Row Name 02/02/24 1640          ADL UCSD   ADL Phase Entry     SOB Score total 60     Rest 0     Walk 3     Stairs 4     Bath 4     Dress 2     Shop 3       CAT Score   CAT Score 17       mMRC Score   mMRC Score 4             UCSD: Self-administered rating of dyspnea associated with activities of daily living (ADLs) 6-point scale (0 = "not at all" to 5 = "maximal or unable to do because of breathlessness")  Scoring Scores range from 0 to 120.  Minimally important difference is 5 units  CAT: CAT can identify the health impairment of COPD patients and is better correlated with disease progression.  CAT has a scoring range of zero to 40. The CAT score is classified into four groups of low (less than 10), medium (10 - 20), high (21-30) and very high (31-40) based on the impact level of disease on health status. A CAT score over 10 suggests significant symptoms.  A worsening CAT score could be explained by an exacerbation, poor medication adherence, poor inhaler technique, or progression of COPD or comorbid conditions.  CAT MCID is 2 points  mMRC: mMRC (Modified Medical Research Council) Dyspnea Scale is used to assess the degree of baseline functional disability in patients of respiratory disease due to dyspnea. No minimal important difference is established. A decrease in score of 1 point or greater is considered a positive change.   Pulmonary Function Assessment:   Exercise Target Goals: Exercise Program Goal: Individual exercise prescription set using results from initial 6 min walk test and THRR while considering  patient's activity barriers and safety.   Exercise Prescription Goal: Initial exercise prescription builds to 30-45 minutes a day of aerobic activity, 2-3 days per week.  Home exercise guidelines will be given to patient during program as part of exercise prescription that the participant will acknowledge.  Activity Barriers & Risk Stratification:  Activity Barriers & Cardiac Risk  Stratification - 01/30/24 0924       Activity Barriers & Cardiac Risk Stratification   Activity Barriers Deconditioning;Muscular Weakness;Shortness of Breath             6 Minute Walk:  6 Minute Walk     Row Name 02/02/24 1630  6 Minute Walk   Phase Initial     Distance 300 feet     Walk Time 3.33 minutes     # of Rest Breaks 5  21 sec, 36 sec, 20 sec, 53 sec, 30 sec (all standing rest)     MPH 1.02     METS 2.08     RPE 15     Perceived Dyspnea  3     VO2 Peak 7.3     Symptoms Yes (comment)     Comments SOB, chest tightness 6/10 hard to breathe     Resting HR 98 bpm     Resting BP 126/74     Resting Oxygen  Saturation  98 %     Exercise Oxygen  Saturation  during 6 min walk 93 %     Max Ex. HR 125 bpm     Max Ex. BP 142/84     2 Minute Post BP 132/64       Interval HR   1 Minute HR 125     2 Minute HR 117     3 Minute HR 109     4 Minute HR 114     5 Minute HR 110     6 Minute HR 112     2 Minute Post HR 105     Interval Heart Rate? Yes       Interval Oxygen    Interval Oxygen ? Yes     Baseline Oxygen  Saturation % 98 %  3L NCC     1 Minute Oxygen  Saturation % 95 %     1 Minute Liters of Oxygen  4 L  NCC     2 Minute Oxygen  Saturation % 97 %     2 Minute Liters of Oxygen  4 L     3 Minute Oxygen  Saturation % 95 %     3 Minute Liters of Oxygen  4 L     4 Minute Oxygen  Saturation % 97 %     4 Minute Liters of Oxygen  4 L     5 Minute Oxygen  Saturation % 98 %     5 Minute Liters of Oxygen  4 L     6 Minute Oxygen  Saturation % 93 %     6 Minute Liters of Oxygen  4 L     2 Minute Post Oxygen  Saturation % 97 %     2 Minute Post Liters of Oxygen  4 L              Oxygen  Initial Assessment:  Oxygen  Initial Assessment - 01/30/24 0921       Home Oxygen    Home Oxygen  Device Home Concentrator;E-Tanks;Portable Concentrator    Sleep Oxygen  Prescription Continuous   ventilator machine for 5 hrs a day   Liters per minute 3    Home Exercise Oxygen   Prescription Continuous    Liters per minute 3   4 liters for shower and walking outside (up hill)   Home Resting Oxygen  Prescription Continuous    Liters per minute 3    Compliance with Home Oxygen  Use Yes   usually compliant     Initial 6 min Walk   Oxygen  Used E-Tanks;Continuous    Liters per minute 4      Program Oxygen  Prescription   Program Oxygen  Prescription E-Tanks;Continuous    Liters per minute 4      Intervention   Short Term Goals To learn and exhibit compliance with exercise, home and travel O2 prescription;To learn and understand importance  of monitoring SPO2 with pulse oximeter and demonstrate accurate use of the pulse oximeter.;To learn and understand importance of maintaining oxygen  saturations>88%;To learn and demonstrate proper pursed lip breathing techniques or other breathing techniques. ;To learn and demonstrate proper use of respiratory medications    Long  Term Goals Exhibits compliance with exercise, home  and travel O2 prescription;Maintenance of O2 saturations>88%;Compliance with respiratory medication;Verbalizes importance of monitoring SPO2 with pulse oximeter and return demonstration;Exhibits proper breathing techniques, such as pursed lip breathing or other method taught during program session;Demonstrates proper use of MDI's             Oxygen  Re-Evaluation:   Oxygen  Discharge (Final Oxygen  Re-Evaluation):   Initial Exercise Prescription:  Initial Exercise Prescription - 02/02/24 1600       Date of Initial Exercise RX and Referring Provider   Date 02/02/24    Referring Provider Celene Coins MD      Oxygen    Oxygen  Continuous    Liters 4    Maintain Oxygen  Saturation 88% or higher      NuStep   Level 1    SPM 60    Minutes 30    METs 1.2      Track   Laps 2    Minutes 5    METs 1      Prescription Details   Frequency (times per week) 2    Duration Progress to 30 minutes of continuous aerobic without signs/symptoms of physical  distress      Intensity   THRR 40-80% of Max Heartrate 120-142    Ratings of Perceived Exertion 11-13    Perceived Dyspnea 0-4      Progression   Progression Continue to progress workloads to maintain intensity without signs/symptoms of physical distress.      Resistance Training   Training Prescription Yes    Weight 3 lb    Reps 10-15             Perform Capillary Blood Glucose checks as needed.  Exercise Prescription Changes:   Exercise Prescription Changes     Row Name 02/02/24 1600             Response to Exercise   Blood Pressure (Admit) 126/74       Blood Pressure (Exercise) 142/84       Blood Pressure (Exit) 132/72       Heart Rate (Admit) 98 bpm       Heart Rate (Exercise) 125 bpm       Heart Rate (Exit) 104 bpm       Oxygen  Saturation (Admit) 98 %       Oxygen  Saturation (Exercise) 93 %       Oxygen  Saturation (Exit) 97 %       Rating of Perceived Exertion (Exercise) 15       Perceived Dyspnea (Exercise) 3       Symptoms standing rest breaks, SOB, chest tightness 6/10 (normal)       Comments walk test results                Exercise Comments:   Exercise Goals and Review:   Exercise Goals     Row Name 02/02/24 1636             Exercise Goals   Increase Physical Activity Yes       Intervention Provide advice, education, support and counseling about physical activity/exercise needs.;Develop an individualized exercise prescription for aerobic and resistive training based on initial  evaluation findings, risk stratification, comorbidities and participant's personal goals.       Expected Outcomes Short Term: Attend rehab on a regular basis to increase amount of physical activity.;Long Term: Add in home exercise to make exercise part of routine and to increase amount of physical activity.;Long Term: Exercising regularly at least 3-5 days a week.       Increase Strength and Stamina Yes       Intervention Provide advice, education, support and  counseling about physical activity/exercise needs.;Develop an individualized exercise prescription for aerobic and resistive training based on initial evaluation findings, risk stratification, comorbidities and participant's personal goals.       Expected Outcomes Short Term: Increase workloads from initial exercise prescription for resistance, speed, and METs.;Short Term: Perform resistance training exercises routinely during rehab and add in resistance training at home;Long Term: Improve cardiorespiratory fitness, muscular endurance and strength as measured by increased METs and functional capacity ( )       Able to understand and use rate of perceived exertion (RPE) scale Yes       Intervention Provide education and explanation on how to use RPE scale       Expected Outcomes Long Term:  Able to use RPE to guide intensity level when exercising independently;Short Term: Able to use RPE daily in rehab to express subjective intensity level       Able to understand and use Dyspnea scale Yes       Intervention Provide education and explanation on how to use Dyspnea scale       Expected Outcomes Short Term: Able to use Dyspnea scale daily in rehab to express subjective sense of shortness of breath during exertion;Long Term: Able to use Dyspnea scale to guide intensity level when exercising independently       Knowledge and understanding of Target Heart Rate Range (THRR) Yes       Intervention Provide education and explanation of THRR including how the numbers were predicted and where they are located for reference       Expected Outcomes Short Term: Able to state/look up THRR;Short Term: Able to use daily as guideline for intensity in rehab;Long Term: Able to use THRR to govern intensity when exercising independently       Able to check pulse independently Yes       Intervention Provide education and demonstration on how to check pulse in carotid and radial arteries.;Review the importance of being able to  check your own pulse for safety during independent exercise       Expected Outcomes Short Term: Able to explain why pulse checking is important during independent exercise;Long Term: Able to check pulse independently and accurately       Understanding of Exercise Prescription Yes       Intervention Provide education, explanation, and written materials on patient's individual exercise prescription       Expected Outcomes Short Term: Able to explain program exercise prescription;Long Term: Able to explain home exercise prescription to exercise independently                Exercise Goals Re-Evaluation :   Discharge Exercise Prescription (Final Exercise Prescription Changes):  Exercise Prescription Changes - 02/02/24 1600       Response to Exercise   Blood Pressure (Admit) 126/74    Blood Pressure (Exercise) 142/84    Blood Pressure (Exit) 132/72    Heart Rate (Admit) 98 bpm    Heart Rate (Exercise) 125 bpm    Heart Rate (  Exit) 104 bpm    Oxygen  Saturation (Admit) 98 %    Oxygen  Saturation (Exercise) 93 %    Oxygen  Saturation (Exit) 97 %    Rating of Perceived Exertion (Exercise) 15    Perceived Dyspnea (Exercise) 3    Symptoms standing rest breaks, SOB, chest tightness 6/10 (normal)    Comments walk test results             Nutrition:  Target Goals: Understanding of nutrition guidelines, daily intake of sodium 1500mg , cholesterol 200mg , calories 30% from fat and 7% or less from saturated fats, daily to have 5 or more servings of fruits and vegetables.  Biometrics:  Pre Biometrics - 02/02/24 1637       Pre Biometrics   Height 5\' 9"  (1.753 m)    Weight 67.9 kg    Waist Circumference 34 inches    Hip Circumference 37.5 inches    Waist to Hip Ratio 0.91 %    BMI (Calculated) 22.08    Grip Strength 37.3 kg    Single Leg Stand 20.9 seconds              Nutrition Therapy Plan and Nutrition Goals:  Nutrition Therapy & Goals - 01/30/24 1007       Intervention  Plan   Intervention Prescribe, educate and counsel regarding individualized specific dietary modifications aiming towards targeted core components such as weight, hypertension, lipid management, diabetes, heart failure and other comorbidities.;Nutrition handout(s) given to patient.    Expected Outcomes Short Term Goal: Understand basic principles of dietary content, such as calories, fat, sodium, cholesterol and nutrients.;Long Term Goal: Adherence to prescribed nutrition plan.             Nutrition Assessments:  MEDIFICTS Score Key: >=70 Need to make dietary changes  40-70 Heart Healthy Diet <= 40 Therapeutic Level Cholesterol Diet  Flowsheet Row PULMONARY REHAB COPD ORIENTATION from 02/02/2024 in [facility] CARDIAC REHABILITATION  Picture Your Plate Total Score on Admission 50      Picture Your Plate Scores: <16 Unhealthy dietary pattern with much room for improvement. 41-50 Dietary pattern unlikely to meet recommendations for good health and room for improvement. 51-60 More healthful dietary pattern, with some room for improvement.  >60 Healthy dietary pattern, although there may be some specific behaviors that could be improved.    Nutrition Goals Re-Evaluation:   Nutrition Goals Discharge (Final Nutrition Goals Re-Evaluation):   Psychosocial: Target Goals: Acknowledge presence or absence of significant depression and/or stress, maximize coping skills, provide positive support system. Participant is able to verbalize types and ability to use techniques and skills needed for reducing stress and depression.  Initial Review & Psychosocial Screening:  Initial Psych Review & Screening - 01/30/24 0925       Initial Review   Current issues with Current Sleep Concerns;Current Stress Concerns    Source of Stress Concerns Chronic Illness;Unable to participate in former interests or hobbies;Unable to perform yard/household activities    Comments chronic bad sleeper, wife wants  him to use his ventilator at home regularly      Family Dynamics   Good Support System? Yes   wife, daughter     Barriers   Psychosocial barriers to participate in program The patient should benefit from training in stress management and relaxation.;Psychosocial barriers identified (see note)      Screening Interventions   Interventions Encouraged to exercise;To provide support and resources with identified psychosocial needs;Provide feedback about the scores to participant    Expected  Outcomes Short Term goal: Utilizing psychosocial counselor, staff and physician to assist with identification of specific Stressors or current issues interfering with healing process. Setting desired goal for each stressor or current issue identified.;Long Term Goal: Stressors or current issues are controlled or eliminated.;Short Term goal: Identification and review with participant of any Quality of Life or Depression concerns found by scoring the questionnaire.;Long Term goal: The participant improves quality of Life and PHQ9 Scores as seen by post scores and/or verbalization of changes             Quality of Life Scores:  Scores of 19 and below usually indicate a poorer quality of life in these areas.  A difference of  2-3 points is a clinically meaningful difference.  A difference of 2-3 points in the total score of the Quality of Life Index has been associated with significant improvement in overall quality of life, self-image, physical symptoms, and general health in studies assessing change in quality of life.   PHQ-9: Review Flowsheet  More data exists      01/30/2024 09/10/2023 04/16/2023 10/14/2022 08/08/2022  Depression screen PHQ 2/9  Decreased Interest 0 0 0 0 0  Down, Depressed, Hopeless 0 0 0 0 0  PHQ - 2 Score 0 0 0 0 0  Altered sleeping 0 0 - - -  Tired, decreased energy 2 0 - - -  Change in appetite 1 0 - - -  Feeling bad or failure about yourself  0 0 - - -  Trouble concentrating 0 0  - - -  Moving slowly or fidgety/restless 0 0 - - -  Suicidal thoughts 0 0 - - -  PHQ-9 Score 3 0 - - -  Difficult doing work/chores Not difficult at all Not difficult at all - - -   Interpretation of Total Score  Total Score Depression Severity:  1-4 = Minimal depression, 5-9 = Mild depression, 10-14 = Moderate depression, 15-19 = Moderately severe depression, 20-27 = Severe depression   Psychosocial Evaluation and Intervention:  Psychosocial Evaluation - 01/30/24 1002       Psychosocial Evaluation & Interventions   Interventions Encouraged to exercise with the program and follow exercise prescription    Comments Sharin David is coming into pulmonary rehab for COPD.  He has a long history and has previously been worked up for transplant, but was disqualified after finding an aneursym.  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  He lives with his wife and has his daughter and her son to check on them frequently.  They have no pets or environmental concerns at home. He does use a scooter to get around due to his SOB.  His wife will help him in the shower, but otherwise he is independently with ADLs.  He is 3L oxygen  NCC 24/7. He will use 4L in shower and walking outside.  He is not able to go and do as he pleases due to his breathing.  He wants to be able to do more and not get so SOB.  They came to look at program prior to signing up and were pleased.  He did rehab before at [facility] prior to transplant work up. They have no barriers to attending other than $15 copay. We talked about how it can be set up as payment plan with hospital as well.    Expected Outcomes Short: Attend rehab regularly Long: Improve stamina and breathing    Continue Psychosocial Services  Follow up required by staff  Psychosocial Re-Evaluation:   Psychosocial Discharge (Final Psychosocial Re-Evaluation):    Education: Education Goals: Education classes will be provided on a weekly basis, covering  required topics. Participant will state understanding/return demonstration of topics presented.  Learning Barriers/Preferences:  Learning Barriers/Preferences - 01/30/24 0924       Learning Barriers/Preferences   Learning Barriers Sight   glasses   Learning Preferences Skilled Demonstration             Education Topics: How Lungs Work and Diseases: - Discuss the anatomy of the lungs and diseases that can affect the lungs, such as COPD.   Exercise: -Discuss the importance of exercise, FITT principles of exercise, normal and abnormal responses to exercise, and how to exercise safely.   Environmental Irritants: -Discuss types of environmental irritants and how to limit exposure to environmental irritants.   Meds/Inhalers and oxygen : - Discuss respiratory medications, definition of an inhaler and oxygen , and the proper way to use an inhaler and oxygen .   Energy Saving Techniques: - Discuss methods to conserve energy and decrease shortness of breath when performing activities of daily living.    Bronchial Hygiene / Breathing Techniques: - Discuss breathing mechanics, pursed-lip breathing technique,  proper posture, effective ways to clear airways, and other functional breathing techniques   Cleaning Equipment: - Provides group verbal and written instruction about the health risks of elevated stress, cause of high stress, and healthy ways to reduce stress.   Nutrition I: Fats: - Discuss the types of cholesterol, what cholesterol does to the body, and how cholesterol levels can be controlled.   Nutrition II: Labels: -Discuss the different components of food labels and how to read food labels.   Respiratory Infections: - Discuss the signs and symptoms of respiratory infections, ways to prevent respiratory infections, and the importance of seeking medical treatment when having a respiratory infection.   Stress I: Signs and Symptoms: - Discuss the causes of stress, how  stress may lead to anxiety and depression, and ways to limit stress.   Stress II: Relaxation: -Discuss relaxation techniques to limit stress.   Oxygen  for Home/Travel: - Discuss how to prepare for travel when on oxygen  and proper ways to transport and store oxygen  to ensure safety.   Knowledge Questionnaire Score:  Knowledge Questionnaire Score - 02/02/24 1638       Knowledge Questionnaire Score   Pre Score 15/18             Core Components/Risk Factors/Patient Goals at Admission:  Personal Goals and Risk Factors at Admission - 02/02/24 1639       Core Components/Risk Factors/Patient Goals on Admission    Weight Management Yes;Weight Maintenance    Intervention Weight Management: Develop a combined nutrition and exercise program designed to reach desired caloric intake, while maintaining appropriate intake of nutrient and fiber, sodium and fats, and appropriate energy expenditure required for the weight goal.;Weight Management: Provide education and appropriate resources to help participant work on and attain dietary goals.    Admit Weight 149 lb 9.6 oz (67.9 kg)    Goal Weight: Short Term 150 lb (68 kg)    Goal Weight: Long Term 150 lb (68 kg)    Expected Outcomes Short Term: Continue to assess and modify interventions until short term weight is achieved;Long Term: Adherence to nutrition and physical activity/exercise program aimed toward attainment of established weight goal;Weight Maintenance: Understanding of the daily nutrition guidelines, which includes 25-35% calories from fat, 7% or less cal from saturated fats, less than 200mg   cholesterol, less than 1.5gm of sodium, & 5 or more servings of fruits and vegetables daily    Improve shortness of breath with ADL's Yes    Intervention Provide education, individualized exercise plan and daily activity instruction to help decrease symptoms of SOB with activities of daily living.    Expected Outcomes Short Term: Improve  cardiorespiratory fitness to achieve a reduction of symptoms when performing ADLs;Long Term: Be able to perform more ADLs without symptoms or delay the onset of symptoms    Increase knowledge of respiratory medications and ability to use respiratory devices properly  Yes    Intervention Provide education and demonstration as needed of appropriate use of medications, inhalers, and oxygen  therapy.    Expected Outcomes Short Term: Achieves understanding of medications use. Understands that oxygen  is a medication prescribed by physician. Demonstrates appropriate use of inhaler and oxygen  therapy.;Long Term: Maintain appropriate use of medications, inhalers, and oxygen  therapy.    Hypertension Yes    Intervention Provide education on lifestyle modifcations including regular physical activity/exercise, weight management, moderate sodium restriction and increased consumption of fresh fruit, vegetables, and low fat dairy, alcohol moderation, and smoking cessation.;Monitor prescription use compliance.    Expected Outcomes Short Term: Continued assessment and intervention until BP is < 140/24mm HG in hypertensive participants. < 130/35mm HG in hypertensive participants with diabetes, heart failure or chronic kidney disease.;Long Term: Maintenance of blood pressure at goal levels.    Lipids Yes    Intervention Provide education and support for participant on nutrition & aerobic/resistive exercise along with prescribed medications to achieve LDL 70mg , HDL >40mg .    Expected Outcomes Short Term: Participant states understanding of desired cholesterol values and is compliant with medications prescribed. Participant is following exercise prescription and nutrition guidelines.;Long Term: Cholesterol controlled with medications as prescribed, with individualized exercise RX and with personalized nutrition plan. Value goals: LDL < 70mg , HDL > 40 mg.             Core Components/Risk Factors/Patient Goals Review:     Core Components/Risk Factors/Patient Goals at Discharge (Final Review):    ITP Comments:  ITP Comments     Row Name 01/30/24 1011 02/02/24 1630         ITP Comments Completed virtual orientation today in person as he came to see program prior to signing up.  EP evaluation is scheduled for Monday May 12 at 1300 .  Documentation for diagnosis can be found in [facility] encounter 01/26/24. Patient attend orientation today.  Patient is attending Pulmonary Rehabilitation Program.  Documentation for diagnosis can be found in [facility] 01/26/24.  Reviewed medical chart, RPE/RPD, gym safety, and program guidelines.  Patient was fitted to equipment they will be using during rehab.  Patient is scheduled to start exercise on Wed 5/14 at 915.   Initial ITP created and sent for review and signature by Dr. Gwendalyn Lemma, Medical Director for Pulmonary Rehabilitation Program.               Comments: Initial ITP

## 2024-02-02 NOTE — Patient Instructions (Signed)
 Patient Instructions  Patient Details  Name: John [name] MRN: [id number] Date of Birth: [birth year] Referring Provider:  Meldon Sport, MD  Below are your personal goals for exercise, nutrition, and risk factors. Our goal is to help you stay on track towards obtaining and maintaining these goals. We will be discussing your progress on these goals with you throughout the program.  Initial Exercise Prescription:  Initial Exercise Prescription - 02/02/24 1600       Date of Initial Exercise RX and Referring Provider   Date 02/02/24    Referring Provider Celene Coins MD      Oxygen    Oxygen  Continuous    Liters 4    Maintain Oxygen  Saturation 88% or higher      NuStep   Level 1    SPM 60    Minutes 30    METs 1.2      Track   Laps 2    Minutes 5    METs 1      Prescription Details   Frequency (times per week) 2    Duration Progress to 30 minutes of continuous aerobic without signs/symptoms of physical distress      Intensity   THRR 40-80% of Max Heartrate 120-142    Ratings of Perceived Exertion 11-13    Perceived Dyspnea 0-4      Progression   Progression Continue to progress workloads to maintain intensity without signs/symptoms of physical distress.      Resistance Training   Training Prescription Yes    Weight 3 lb    Reps 10-15             Exercise Goals: Frequency: Be able to perform aerobic exercise two to three times per week in program working toward 2-5 days per week of home exercise.  Intensity: Work with a perceived exertion of 11 (fairly light) - 15 (hard) while following your exercise prescription.  We will make changes to your prescription with you as you progress through the program.   Duration: Be able to do 30 to 45 minutes of continuous aerobic exercise in addition to a 5 minute warm-up and a 5 minute cool-down routine.   Nutrition Goals: Your personal nutrition goals will be established when you do your nutrition analysis with the  dietician.  The following are general nutrition guidelines to follow: Cholesterol < 200mg /day Sodium < 1500mg /day Fiber: Men over 50 yrs - 30 grams per day  Personal Goals:  Personal Goals and Risk Factors at Admission - 02/02/24 1639       Core Components/Risk Factors/Patient Goals on Admission    Weight Management Yes;Weight Maintenance    Intervention Weight Management: Develop a combined nutrition and exercise program designed to reach desired caloric intake, while maintaining appropriate intake of nutrient and fiber, sodium and fats, and appropriate energy expenditure required for the weight goal.;Weight Management: Provide education and appropriate resources to help participant work on and attain dietary goals.    Admit Weight 149 lb 9.6 oz (67.9 kg)    Goal Weight: Short Term 150 lb (68 kg)    Goal Weight: Long Term 150 lb (68 kg)    Expected Outcomes Short Term: Continue to assess and modify interventions until short term weight is achieved;Long Term: Adherence to nutrition and physical activity/exercise program aimed toward attainment of established weight goal;Weight Maintenance: Understanding of the daily nutrition guidelines, which includes 25-35% calories from fat, 7% or less cal from saturated fats, less than 200mg  cholesterol, less than 1.5gm  of sodium, & 5 or more servings of fruits and vegetables daily    Improve shortness of breath with ADL's Yes    Intervention Provide education, individualized exercise plan and daily activity instruction to help decrease symptoms of SOB with activities of daily living.    Expected Outcomes Short Term: Improve cardiorespiratory fitness to achieve a reduction of symptoms when performing ADLs;Long Term: Be able to perform more ADLs without symptoms or delay the onset of symptoms    Increase knowledge of respiratory medications and ability to use respiratory devices properly  Yes    Intervention Provide education and demonstration as needed of  appropriate use of medications, inhalers, and oxygen  therapy.    Expected Outcomes Short Term: Achieves understanding of medications use. Understands that oxygen  is a medication prescribed by physician. Demonstrates appropriate use of inhaler and oxygen  therapy.;Long Term: Maintain appropriate use of medications, inhalers, and oxygen  therapy.    Hypertension Yes    Intervention Provide education on lifestyle modifcations including regular physical activity/exercise, weight management, moderate sodium restriction and increased consumption of fresh fruit, vegetables, and low fat dairy, alcohol moderation, and smoking cessation.;Monitor prescription use compliance.    Expected Outcomes Short Term: Continued assessment and intervention until BP is < 140/59mm HG in hypertensive participants. < 130/81mm HG in hypertensive participants with diabetes, heart failure or chronic kidney disease.;Long Term: Maintenance of blood pressure at goal levels.    Lipids Yes    Intervention Provide education and support for participant on nutrition & aerobic/resistive exercise along with prescribed medications to achieve LDL 70mg , HDL >40mg .    Expected Outcomes Short Term: Participant states understanding of desired cholesterol values and is compliant with medications prescribed. Participant is following exercise prescription and nutrition guidelines.;Long Term: Cholesterol controlled with medications as prescribed, with individualized exercise RX and with personalized nutrition plan. Value goals: LDL < 70mg , HDL > 40 mg.             Tobacco Use Initial Evaluation: Social History   Tobacco Use  Smoking Status Former   Current packs/day: 0.00   Average packs/day: 2.0 packs/day for 51.6 years (103.1 ttl pk-yrs)   Types: Cigarettes   Start date: 46   Quit date: 04/18/2020   Years since quitting: 3.7  Smokeless Tobacco Never  Tobacco Comments   2 plus packs per day. He has a 100 + pack - year history of tobacco  abuse currently. Former 4 ppd for 25 years.    Exercise Goals and Review:  Exercise Goals     Row Name 02/02/24 1636             Exercise Goals   Increase Physical Activity Yes       Intervention Provide advice, education, support and counseling about physical activity/exercise needs.;Develop an individualized exercise prescription for aerobic and resistive training based on initial evaluation findings, risk stratification, comorbidities and participant's personal goals.       Expected Outcomes Short Term: Attend rehab on a regular basis to increase amount of physical activity.;Long Term: Add in home exercise to make exercise part of routine and to increase amount of physical activity.;Long Term: Exercising regularly at least 3-5 days a week.       Increase Strength and Stamina Yes       Intervention Provide advice, education, support and counseling about physical activity/exercise needs.;Develop an individualized exercise prescription for aerobic and resistive training based on initial evaluation findings, risk stratification, comorbidities and participant's personal goals.  Expected Outcomes Short Term: Increase workloads from initial exercise prescription for resistance, speed, and METs.;Short Term: Perform resistance training exercises routinely during rehab and add in resistance training at home;Long Term: Improve cardiorespiratory fitness, muscular endurance and strength as measured by increased METs and functional capacity ( )       Able to understand and use rate of perceived exertion (RPE) scale Yes       Intervention Provide education and explanation on how to use RPE scale       Expected Outcomes Long Term:  Able to use RPE to guide intensity level when exercising independently;Short Term: Able to use RPE daily in rehab to express subjective intensity level       Able to understand and use Dyspnea scale Yes       Intervention Provide education and explanation on how to use  Dyspnea scale       Expected Outcomes Short Term: Able to use Dyspnea scale daily in rehab to express subjective sense of shortness of breath during exertion;Long Term: Able to use Dyspnea scale to guide intensity level when exercising independently       Knowledge and understanding of Target Heart Rate Range (THRR) Yes       Intervention Provide education and explanation of THRR including how the numbers were predicted and where they are located for reference       Expected Outcomes Short Term: Able to state/look up THRR;Short Term: Able to use daily as guideline for intensity in rehab;Long Term: Able to use THRR to govern intensity when exercising independently       Able to check pulse independently Yes       Intervention Provide education and demonstration on how to check pulse in carotid and radial arteries.;Review the importance of being able to check your own pulse for safety during independent exercise       Expected Outcomes Short Term: Able to explain why pulse checking is important during independent exercise;Long Term: Able to check pulse independently and accurately       Understanding of Exercise Prescription Yes       Intervention Provide education, explanation, and written materials on patient's individual exercise prescription       Expected Outcomes Short Term: Able to explain program exercise prescription;Long Term: Able to explain home exercise prescription to exercise independently              Copy of goals given to participant.

## 2024-02-03 ENCOUNTER — Encounter (HOSPITAL_COMMUNITY): Payer: Self-pay | Admitting: *Deleted

## 2024-02-04 ENCOUNTER — Encounter (HOSPITAL_COMMUNITY)
Admission: RE | Admit: 2024-02-04 | Discharge: 2024-02-04 | Disposition: A | Discharge status: Home / Self Care | Source: Ambulatory Visit | Attending: Pulmonary Disease | Admitting: Pulmonary Disease

## 2024-02-04 DIAGNOSIS — J4489 Other specified chronic obstructive pulmonary disease: Secondary | ICD-10-CM | POA: Diagnosis not present

## 2024-02-04 DIAGNOSIS — J439 Emphysema, unspecified: Secondary | ICD-10-CM | POA: Diagnosis not present

## 2024-02-04 NOTE — Progress Notes (Signed)
 Daily Session Note  Patient Details  Name: John [name] MRN: [id number] Date of Birth: Jan 11, 1956 Referring Provider:   Flowsheet Row PULMONARY REHAB COPD ORIENTATION from 02/02/2024 in [facility] C[facility] RE[facility]  Referring Provider Celene Coins MD       Encounter Date: 02/04/2024  Check In:  Session Check In - 02/04/24 0912       Check-In   Supervising physician immediately available to respond to emergencies See telemetry face sheet for immediately available MD    Location [facility]    Staff Present Jerrol Morelle, BSN, RN, WTA-C;Heather Alec Huntington, Exercise Physiologist;Hillary Troutman BSN, RN    Virtual Visit No    Medication changes reported     No    Fall or balance concerns reported    No    Tobacco Cessation No Change    Warm-up and Cool-down Performed on first and last piece of equipment    Resistance Training Performed Yes    VAD Patient? No    PAD/SET Patient? No      Pain Assessment   Currently in Pain? No/denies             Capillary Blood Glucose: No results found for this or any previous visit (from the past 24 hours).    Social History   Tobacco Use  Smoking Status Former   Current packs/day: 0.00   Average packs/day: 2.0 packs/day for 51.6 years (103.1 ttl pk-yrs)   Types: Cigarettes   Start date: 55   Quit date: 04/18/2020   Years since quitting: 3.8  Smokeless Tobacco Never  Tobacco Comments   2 plus packs per day. He has a 100 + pack - year history of tobacco abuse currently. Former 4 ppd for 25 years.    Goals Met:  Proper associated with RPD/PD & O2 Sat Independence with exercise equipment Improved SOB with ADL's Using PLB without cueing & demonstrates good technique Exercise tolerated well No report of concerns or symptoms today Strength training completed today  Goals Unmet:  Not Applicable  Comments: First full day of exercise!  Patient was oriented to gym and equipment including  functions, settings, policies, and procedures.  Patient's individual exercise prescription and treatment plan were reviewed.  All starting workloads were established based on the results of the 6 minute walk test done at initial orientation visit.  The plan for exercise progression was also introduced and progression will be customized based on patient's performance and goals.

## 2024-02-09 ENCOUNTER — Encounter (HOSPITAL_COMMUNITY)
Admission: RE | Admit: 2024-02-09 | Discharge: 2024-02-09 | Disposition: A | Discharge status: Home / Self Care | Source: Ambulatory Visit | Attending: Pulmonary Disease | Admitting: Pulmonary Disease

## 2024-02-09 DIAGNOSIS — J4489 Other specified chronic obstructive pulmonary disease: Secondary | ICD-10-CM

## 2024-02-09 DIAGNOSIS — J439 Emphysema, unspecified: Secondary | ICD-10-CM

## 2024-02-09 NOTE — Progress Notes (Signed)
 Daily Session Note  Patient Details  Name: SAIF PETER MRN: [id number] Date of Birth: 1956-07-31 Referring Provider:   Flowsheet Row PULMONARY REHAB COPD ORIENTATION from 02/02/2024 in [facility] C[facility] REH[facility]IL[facility]  Referring Provider Celene Coins MD       Encounter Date: 02/09/2024  Check In:  Session Check In - 02/09/24 0930       Check-In   Supervising physician immediately available to respond to emergencies See telemetry face sheet for immediately available MD    Location [facility]    Staff Present Clotilda Danish, BS, Exercise Physiologist;Brittany Annette Barters, BSN, RN, WTA-C;Byard Carranza, RN;Jessica [place], MA, RCEP, CCRP, Amador Bad, RN, BSN    Virtual Visit No    Medication changes reported     No    Fall or balance concerns reported    No    Warm-up and Cool-down Performed on first and last piece of equipment    Resistance Training Performed Yes    VAD Patient? No    PAD/SET Patient? No      Pain Assessment   Currently in Pain? No/denies    Multiple Pain Sites No             Capillary Blood Glucose: No results found for this or any previous visit (from the past 24 hours).    Social History   Tobacco Use  Smoking Status Former   Current packs/day: 0.00   Average packs/day: 2.0 packs/day for 51.6 years (103.1 ttl pk-yrs)   Types: Cigarettes   Start date: 49   Quit date: 04/18/2020   Years since quitting: 3.8  Smokeless Tobacco Never  Tobacco Comments   2 plus packs per day. He has a 100 + pack - year history of tobacco abuse currently. Former 4 ppd for 25 years.    Goals Met:  Proper associated with RPD/PD & O2 Sat Independence with exercise equipment Using PLB without cueing & demonstrates good technique Exercise tolerated well No report of concerns or symptoms today Strength training completed today  Goals Unmet:  Not Applicable  Comments: Pt able to follow exercise prescription today without  complaint.  Will continue to monitor for progression.

## 2024-02-11 ENCOUNTER — Encounter (HOSPITAL_COMMUNITY): Payer: Self-pay | Admitting: *Deleted

## 2024-02-11 ENCOUNTER — Encounter (HOSPITAL_COMMUNITY)
Admission: RE | Admit: 2024-02-11 | Discharge: 2024-02-11 | Disposition: A | Discharge status: Home / Self Care | Source: Ambulatory Visit | Attending: Pulmonary Disease | Admitting: Pulmonary Disease

## 2024-02-11 DIAGNOSIS — J4489 Other specified chronic obstructive pulmonary disease: Secondary | ICD-10-CM

## 2024-02-11 DIAGNOSIS — J439 Emphysema, unspecified: Secondary | ICD-10-CM

## 2024-02-11 NOTE — Progress Notes (Signed)
 Pulmonary Individual Treatment Plan  Patient Details  Name: John [name] MRN: [id number] Date of Birth: 1956-02-21 Referring Provider:   Flowsheet Row PULMONARY REHAB COPD ORIENTATION from 02/02/2024 in [facility] CARDIAC REHABILITATION  Referring Provider Celene Coins MD       Initial Encounter Date:  Flowsheet Row PULMONARY REHAB COPD ORIENTATION from 02/02/2024 in [place] CARDIAC REHABILITATION  Date 02/02/24       Visit Diagnosis: Pulmonary emphysema, unspecified emphysema type (HCC)  Chronic bronchitis with COPD (chronic obstructive pulmonary disease) (HCC)  Patient's Home Medications on Admission:   Current Outpatient Medications:    albuterol  (PROAIR  HFA) 108 (90 Base) MCG/ACT inhaler, Inhale 2 puffs into the lungs every 4 (four) hours as needed for wheezing or shortness of breath. 2 puffs every 4 hours as needed only  if your can't catch your breath, Disp: 8 g, Rfl: 5   albuterol  (PROVENTIL ) (2.5 MG/3ML) 0.083% nebulizer solution, Take 3 mLs (2.5 mg total) by nebulization every 4 (four) hours as needed for wheezing or shortness of breath., Disp: 360 mL, Rfl: 5   aspirin  EC 81 MG tablet, Take 81 mg by mouth daily., Disp: , Rfl:    atorvastatin  (LIPITOR ) 40 MG tablet, Take 1 tablet (40 mg total) by mouth daily., Disp: 90 tablet, Rfl: 3   Budeson-Glycopyrrol-Formoterol  (BREZTRI  AEROSPHERE) 160-9-4.8 MCG/ACT AERO, Inhale 2 puffs into the lungs in the morning and at bedtime., Disp: 10.7 g, Rfl: 11   furosemide  (LASIX ) 40 MG tablet, Take 1 tablet (40 mg total) by mouth daily., Disp: 90 tablet, Rfl: 2   guaiFENesin  (MUCINEX ) 600 MG 12 hr tablet, Take 2 tablets (1,200 mg total) by mouth 2 (two) times daily as needed for cough or to loosen phlegm., Disp: , Rfl:    Multiple Vitamin (MULTIVITAMIN WITH MINERALS) TABS tablet, Take 1 tablet by mouth daily., Disp: , Rfl:    nitroGLYCERIN  (NITROSTAT ) 0.4 MG SL tablet, Place 1 tablet (0.4 mg total) under the tongue every 5 (five)  minutes as needed for chest pain., Disp: 25 tablet, Rfl: 6   OXYGEN , Inhale 2-3 L into the lungs., Disp: , Rfl:   Past Medical History: Past Medical History:  Diagnosis Date   Alcohol abuse 12/27/2010   Allergic rhinitis, cause unspecified 12/27/2010   Anal warts 04/11/2012   CAD, NATIVE VESSEL 10/04/2010   COPD (chronic obstructive pulmonary disease) (HCC)    "a touch" (09/28/2013)   History of blood transfusion 03/2012   related to nose bleed   HYPERLIPIDEMIA-MIXED 10/04/2010   HYPERTENSION, BENIGN 10/04/2010   Myocardial infarction (HCC) 09/16/2010   Pneumonia    "when I was a kid"   Stented coronary artery    Mid LAD    TOBACCO ABUSE 10/04/2010    Tobacco Use: Social History   Tobacco Use  Smoking Status Former   Current packs/day: 0.00   Average packs/day: 2.0 packs/day for 51.6 years (103.1 ttl pk-yrs)   Types: Cigarettes   Start date: 17   Quit date: 04/18/2020   Years since quitting: 3.8  Smokeless Tobacco Never  Tobacco Comments   2 plus packs per day. He has a 100 + pack - year history of tobacco abuse currently. Former 4 ppd for 25 years.    Labs: Review Flowsheet  More data exists      Latest Ref Rng & Units 02/12/2021 03/22/2022 07/28/2022 10/14/2022 10/21/2023  Labs for ITP Cardiac and Pulmonary Rehab  Cholestrol 100 - 199 mg/dL - 865  - 784  696  LDL (calc) 0 - 99 mg/dL - 76  - 72  81   HDL-C >39 mg/dL - 77  - 76  86   Trlycerides 0 - 149 mg/dL - 70  - 55  57   PH, Arterial 7.35 - 7.45 7.283  - 7.265  - -  PCO2 arterial 32 - 48 mmHg 80.9  - 80.0  - -  Bicarbonate 20.0 - 28.0 mmol/L 31.4  - 36.7  - -  TCO2 22 - 32 mmol/L - - 39  - -  O2 Saturation % 97.2  - 97  - -    Capillary Blood Glucose: Lab Results  Component Value Date   GLUCAP 185 (H) 04/25/2020   GLUCAP 80 04/25/2020   GLUCAP 80 04/25/2020   GLUCAP 131 (H) 04/24/2020   GLUCAP 176 (H) 04/24/2020     Pulmonary Assessment Scores:  Pulmonary Assessment Scores     Row Name 02/02/24 1640          ADL UCSD   ADL Phase Entry     SOB Score total 60     Rest 0     Walk 3     Stairs 4     Bath 4     Dress 2     Shop 3       CAT Score   CAT Score 17       mMRC Score   mMRC Score 4             UCSD: Self-administered rating of dyspnea associated with activities of daily living (ADLs) 6-point scale (0 = "not at all" to 5 = "maximal or unable to do because of breathlessness")  Scoring Scores range from 0 to 120.  Minimally important difference is 5 units  CAT: CAT can identify the health impairment of COPD patients and is better correlated with disease progression.  CAT has a scoring range of zero to 40. The CAT score is classified into four groups of low (less than 10), medium (10 - 20), high (21-30) and very high (31-40) based on the impact level of disease on health status. A CAT score over 10 suggests significant symptoms.  A worsening CAT score could be explained by an exacerbation, poor medication adherence, poor inhaler technique, or progression of COPD or comorbid conditions.  CAT MCID is 2 points  mMRC: mMRC (Modified Medical Research Council) Dyspnea Scale is used to assess the degree of baseline functional disability in patients of respiratory disease due to dyspnea. No minimal important difference is established. A decrease in score of 1 point or greater is considered a positive change.   Pulmonary Function Assessment:  Pulmonary Function Assessment - 02/02/24 1643       Initial Spirometry Results   FVC% 34 %    FEV1% 58 %    FEV1/FVC Ratio 35    Comments test 12/09/19      Post Bronchodilator Spirometry Results   FVC% 33 %    FEV1% 59 %    FEV1/FVC Ratio 37    Comments test 12/09/19      Breath   Shortness of Breath Yes;Fear of Shortness of Breath;Limiting activity;Panic with Shortness of Breath             Exercise Target Goals: Exercise Program Goal: Individual exercise prescription set using results from initial 6 min walk test and THRR  while considering  patient's activity barriers and safety.   Exercise Prescription Goal: Initial exercise prescription builds to 30-45  minutes a day of aerobic activity, 2-3 days per week.  Home exercise guidelines will be given to patient during program as part of exercise prescription that the participant will acknowledge.  Activity Barriers & Risk Stratification:  Activity Barriers & Cardiac Risk Stratification - 01/30/24 0924       Activity Barriers & Cardiac Risk Stratification   Activity Barriers Deconditioning;Muscular Weakness;Shortness of Breath             6 Minute Walk:  6 Minute Walk     Row Name 02/02/24 1630         6 Minute Walk   Phase Initial     Distance 300 feet     Walk Time 3.33 minutes     # of Rest Breaks 5  21 sec, 36 sec, 20 sec, 53 sec, 30 sec (all standing rest)     MPH 1.02     METS 2.08     RPE 15     Perceived Dyspnea  3     VO2 Peak 7.3     Symptoms Yes (comment)     Comments SOB, chest tightness 6/10 hard to breathe     Resting HR 98 bpm     Resting BP 126/74     Resting Oxygen  Saturation  98 %     Exercise Oxygen  Saturation  during 6 min walk 93 %     Max Ex. HR 125 bpm     Max Ex. BP 142/84     2 Minute Post BP 132/64       Interval HR   1 Minute HR 125     2 Minute HR 117     3 Minute HR 109     4 Minute HR 114     5 Minute HR 110     6 Minute HR 112     2 Minute Post HR 105     Interval Heart Rate? Yes       Interval Oxygen    Interval Oxygen ? Yes     Baseline Oxygen  Saturation % 98 %  3L NCC     1 Minute Oxygen  Saturation % 95 %     1 Minute Liters of Oxygen  4 L  NCC     2 Minute Oxygen  Saturation % 97 %     2 Minute Liters of Oxygen  4 L     3 Minute Oxygen  Saturation % 95 %     3 Minute Liters of Oxygen  4 L     4 Minute Oxygen  Saturation % 97 %     4 Minute Liters of Oxygen  4 L     5 Minute Oxygen  Saturation % 98 %     5 Minute Liters of Oxygen  4 L     6 Minute Oxygen  Saturation % 93 %     6 Minute Liters of  Oxygen  4 L     2 Minute Post Oxygen  Saturation % 97 %     2 Minute Post Liters of Oxygen  4 L              Oxygen  Initial Assessment:  Oxygen  Initial Assessment - 01/30/24 0921       Home Oxygen    Home Oxygen  Device Home Concentrator;E-Tanks;Portable Concentrator    Sleep Oxygen  Prescription Continuous   ventilator machine for 5 hrs a day   Liters per minute 3    Home Exercise Oxygen  Prescription Continuous    Liters per minute 3   4 liters  for shower and walking outside (up hill)   Home Resting Oxygen  Prescription Continuous    Liters per minute 3    Compliance with Home Oxygen  Use Yes   usually compliant     Initial 6 min Walk   Oxygen  Used E-Tanks;Continuous    Liters per minute 4      Program Oxygen  Prescription   Program Oxygen  Prescription E-Tanks;Continuous    Liters per minute 4      Intervention   Short Term Goals To learn and exhibit compliance with exercise, home and travel O2 prescription;To learn and understand importance of monitoring SPO2 with pulse oximeter and demonstrate accurate use of the pulse oximeter.;To learn and understand importance of maintaining oxygen  saturations>88%;To learn and demonstrate proper pursed lip breathing techniques or other breathing techniques. ;To learn and demonstrate proper use of respiratory medications    Long  Term Goals Exhibits compliance with exercise, home  and travel O2 prescription;Maintenance of O2 saturations>88%;Compliance with respiratory medication;Verbalizes importance of monitoring SPO2 with pulse oximeter and return demonstration;Exhibits proper breathing techniques, such as pursed lip breathing or other method taught during program session;Demonstrates proper use of MDI's             Oxygen  Re-Evaluation:   Oxygen  Discharge (Final Oxygen  Re-Evaluation):   Initial Exercise Prescription:  Initial Exercise Prescription - 02/02/24 1600       Date of Initial Exercise RX and Referring Provider   Date  02/02/24    Referring Provider Celene Coins MD      Oxygen    Oxygen  Continuous    Liters 4    Maintain Oxygen  Saturation 88% or higher      NuStep   Level 1    SPM 60    Minutes 30    METs 1.2      Track   Laps 2    Minutes 5    METs 1      Prescription Details   Frequency (times per week) 2    Duration Progress to 30 minutes of continuous aerobic without signs/symptoms of physical distress      Intensity   THRR 40-80% of Max Heartrate 120-142    Ratings of Perceived Exertion 11-13    Perceived Dyspnea 0-4      Progression   Progression Continue to progress workloads to maintain intensity without signs/symptoms of physical distress.      Resistance Training   Training Prescription Yes    Weight 3 lb    Reps 10-15             Perform Capillary Blood Glucose checks as needed.  Exercise Prescription Changes:   Exercise Prescription Changes     Row Name 02/02/24 1600 02/04/24 1300           Response to Exercise   Blood Pressure (Admit) 126/74 128/58      Blood Pressure (Exercise) 142/84 134/70      Blood Pressure (Exit) 132/72 120/62      Heart Rate (Admit) 98 bpm 106 bpm      Heart Rate (Exercise) 125 bpm 106 bpm      Heart Rate (Exit) 104 bpm 100 bpm      Oxygen  Saturation (Admit) 98 % 94 %      Oxygen  Saturation (Exercise) 93 % 100 %      Oxygen  Saturation (Exit) 97 % 98 %      Rating of Perceived Exertion (Exercise) 15 15      Perceived Dyspnea (Exercise) 3 2  Symptoms standing rest breaks, SOB, chest tightness 6/10 (normal) --      Comments walk test results --      Duration -- Continue with 30 min of aerobic exercise without signs/symptoms of physical distress.      Intensity -- THRR unchanged        Progression   Progression -- Continue to progress workloads to maintain intensity without signs/symptoms of physical distress.        Resistance Training   Training Prescription -- Yes      Weight -- 3      Reps -- 10-15        Oxygen     Oxygen  -- Continuous      Liters -- 4        NuStep   Level -- 1      SPM -- 36      Minutes -- 30      METs -- 1.3        Oxygen    Maintain Oxygen  Saturation -- 88% or higher               Exercise Comments:   Exercise Goals and Review:   Exercise Goals     Row Name 02/02/24 1636             Exercise Goals   Increase Physical Activity Yes       Intervention Provide advice, education, support and counseling about physical activity/exercise needs.;Develop an individualized exercise prescription for aerobic and resistive training based on initial evaluation findings, risk stratification, comorbidities and participant's personal goals.       Expected Outcomes Short Term: Attend rehab on a regular basis to increase amount of physical activity.;Long Term: Add in home exercise to make exercise part of routine and to increase amount of physical activity.;Long Term: Exercising regularly at least 3-5 days a week.       Increase Strength and Stamina Yes       Intervention Provide advice, education, support and counseling about physical activity/exercise needs.;Develop an individualized exercise prescription for aerobic and resistive training based on initial evaluation findings, risk stratification, comorbidities and participant's personal goals.       Expected Outcomes Short Term: Increase workloads from initial exercise prescription for resistance, speed, and METs.;Short Term: Perform resistance training exercises routinely during rehab and add in resistance training at home;Long Term: Improve cardiorespiratory fitness, muscular endurance and strength as measured by increased METs and functional capacity ( )       Able to understand and use rate of perceived exertion (RPE) scale Yes       Intervention Provide education and explanation on how to use RPE scale       Expected Outcomes Long Term:  Able to use RPE to guide intensity level when exercising independently;Short Term: Able  to use RPE daily in rehab to express subjective intensity level       Able to understand and use Dyspnea scale Yes       Intervention Provide education and explanation on how to use Dyspnea scale       Expected Outcomes Short Term: Able to use Dyspnea scale daily in rehab to express subjective sense of shortness of breath during exertion;Long Term: Able to use Dyspnea scale to guide intensity level when exercising independently       Knowledge and understanding of Target Heart Rate Range (THRR) Yes       Intervention Provide education and explanation of THRR including how the numbers were predicted  and where they are located for reference       Expected Outcomes Short Term: Able to state/look up THRR;Short Term: Able to use daily as guideline for intensity in rehab;Long Term: Able to use THRR to govern intensity when exercising independently       Able to check pulse independently Yes       Intervention Provide education and demonstration on how to check pulse in carotid and radial arteries.;Review the importance of being able to check your own pulse for safety during independent exercise       Expected Outcomes Short Term: Able to explain why pulse checking is important during independent exercise;Long Term: Able to check pulse independently and accurately       Understanding of Exercise Prescription Yes       Intervention Provide education, explanation, and written materials on patient's individual exercise prescription       Expected Outcomes Short Term: Able to explain program exercise prescription;Long Term: Able to explain home exercise prescription to exercise independently                Exercise Goals Re-Evaluation :   Discharge Exercise Prescription (Final Exercise Prescription Changes):  Exercise Prescription Changes - 02/04/24 1300       Response to Exercise   Blood Pressure (Admit) 128/58    Blood Pressure (Exercise) 134/70    Blood Pressure (Exit) 120/62    Heart Rate  (Admit) 106 bpm    Heart Rate (Exercise) 106 bpm    Heart Rate (Exit) 100 bpm    Oxygen  Saturation (Admit) 94 %    Oxygen  Saturation (Exercise) 100 %    Oxygen  Saturation (Exit) 98 %    Rating of Perceived Exertion (Exercise) 15    Perceived Dyspnea (Exercise) 2    Duration Continue with 30 min of aerobic exercise without signs/symptoms of physical distress.    Intensity THRR unchanged      Progression   Progression Continue to progress workloads to maintain intensity without signs/symptoms of physical distress.      Resistance Training   Training Prescription Yes    Weight 3    Reps 10-15      Oxygen    Oxygen  Continuous    Liters 4      NuStep   Level 1    SPM 36    Minutes 30    METs 1.3      Oxygen    Maintain Oxygen  Saturation 88% or higher             Nutrition:  Target Goals: Understanding of nutrition guidelines, daily intake of sodium 1500mg , cholesterol 200mg , calories 30% from fat and 7% or less from saturated fats, daily to have 5 or more servings of fruits and vegetables.  Biometrics:  Pre Biometrics - 02/02/24 1637       Pre Biometrics   Height 5\' 9"  (1.753 m)    Weight 149 lb 9.6 oz (67.9 kg)    Waist Circumference 34 inches    Hip Circumference 37.5 inches    Waist to Hip Ratio 0.91 %    BMI (Calculated) 22.08    Grip Strength 37.3 kg    Single Leg Stand 20.9 seconds              Nutrition Therapy Plan and Nutrition Goals:  Nutrition Therapy & Goals - 01/30/24 1007       Intervention Plan   Intervention Prescribe, educate and counsel regarding individualized specific dietary modifications aiming towards targeted  core components such as weight, hypertension, lipid management, diabetes, heart failure and other comorbidities.;Nutrition handout(s) given to patient.    Expected Outcomes Short Term Goal: Understand basic principles of dietary content, such as calories, fat, sodium, cholesterol and nutrients.;Long Term Goal: Adherence to  prescribed nutrition plan.             Nutrition Assessments:  MEDIFICTS Score Key: >=70 Need to make dietary changes  40-70 Heart Healthy Diet <= 40 Therapeutic Level Cholesterol Diet  Flowsheet Row PULMONARY REHAB COPD ORIENTATION from 02/02/2024 in [facility] CARDIAC REHABILITATION  Picture Your Plate Total Score on Admission 50      Picture Your Plate Scores: <16 Unhealthy dietary pattern with much room for improvement. 41-50 Dietary pattern unlikely to meet recommendations for good health and room for improvement. 51-60 More healthful dietary pattern, with some room for improvement.  >60 Healthy dietary pattern, although there may be some specific behaviors that could be improved.    Nutrition Goals Re-Evaluation:   Nutrition Goals Discharge (Final Nutrition Goals Re-Evaluation):   Psychosocial: Target Goals: Acknowledge presence or absence of significant depression and/or stress, maximize coping skills, provide positive support system. Participant is able to verbalize types and ability to use techniques and skills needed for reducing stress and depression.  Initial Review & Psychosocial Screening:  Initial Psych Review & Screening - 01/30/24 0925       Initial Review   Current issues with Current Sleep Concerns;Current Stress Concerns    Source of Stress Concerns Chronic Illness;Unable to participate in former interests or hobbies;Unable to perform yard/household activities    Comments chronic bad sleeper, wife wants him to use his ventilator at home regularly      Family Dynamics   Good Support System? Yes   wife, daughter     Barriers   Psychosocial barriers to participate in program The patient should benefit from training in stress management and relaxation.;Psychosocial barriers identified (see note)      Screening Interventions   Interventions Encouraged to exercise;To provide support and resources with identified psychosocial needs;Provide feedback  about the scores to participant    Expected Outcomes Short Term goal: Utilizing psychosocial counselor, staff and physician to assist with identification of specific Stressors or current issues interfering with healing process. Setting desired goal for each stressor or current issue identified.;Long Term Goal: Stressors or current issues are controlled or eliminated.;Short Term goal: Identification and review with participant of any Quality of Life or Depression concerns found by scoring the questionnaire.;Long Term goal: The participant improves quality of Life and PHQ9 Scores as seen by post scores and/or verbalization of changes             Quality of Life Scores:  Scores of 19 and below usually indicate a poorer quality of life in these areas.  A difference of  2-3 points is a clinically meaningful difference.  A difference of 2-3 points in the total score of the Quality of Life Index has been associated with significant improvement in overall quality of life, self-image, physical symptoms, and general health in studies assessing change in quality of life.   PHQ-9: Review Flowsheet  More data exists      01/30/2024 09/10/2023 04/16/2023 10/14/2022 08/08/2022  Depression screen PHQ 2/9  Decreased Interest 0 0 0 0 0  Down, Depressed, Hopeless 0 0 0 0 0  PHQ - 2 Score 0 0 0 0 0  Altered sleeping 0 0 - - -  Tired, decreased energy 2 0 - - -  Change in appetite 1 0 - - -  Feeling bad or failure about yourself  0 0 - - -  Trouble concentrating 0 0 - - -  Moving slowly or fidgety/restless 0 0 - - -  Suicidal thoughts 0 0 - - -  PHQ-9 Score 3 0 - - -  Difficult doing work/chores Not difficult at all Not difficult at all - - -   Interpretation of Total Score  Total Score Depression Severity:  1-4 = Minimal depression, 5-9 = Mild depression, 10-14 = Moderate depression, 15-19 = Moderately severe depression, 20-27 = Severe depression   Psychosocial Evaluation and Intervention:   Psychosocial Evaluation - 01/30/24 1002       Psychosocial Evaluation & Interventions   Interventions Encouraged to exercise with the program and follow exercise prescription    Comments John [name] is coming into pulmonary rehab for COPD.  He has a long history and has previously been worked up for transplant, but was disqualified after finding an aneursym.  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  He lives with his wife and has his daughter and her son to check on them frequently.  They have no pets or environmental concerns at home. He does use a scooter to get around due to his SOB.  His wife will help him in the shower, but otherwise he is independently with ADLs.  He is 3L oxygen  NCC 24/7. He will use 4L in shower and walking outside.  He is not able to go and do as he pleases due to his breathing.  He wants to be able to do more and not get so SOB.  They came to look at program prior to signing up and were pleased.  He did rehab before at [facility] prior to transplant work up. They have no barriers to attending other than $15 copay. We talked about how it can be set up as payment plan with hospital as well.    Expected Outcomes Short: Attend rehab regularly Long: Improve stamina and breathing    Continue Psychosocial Services  Follow up required by staff             Psychosocial Re-Evaluation:   Psychosocial Discharge (Final Psychosocial Re-Evaluation):    Education: Education Goals: Education classes will be provided on a weekly basis, covering required topics. Participant will state understanding/return demonstration of topics presented.  Learning Barriers/Preferences:  Learning Barriers/Preferences - 01/30/24 0924       Learning Barriers/Preferences   Learning Barriers Sight   glasses   Learning Preferences Skilled Demonstration             Education Topics: How Lungs Work and Diseases: - Discuss the anatomy of the lungs and diseases that can affect the lungs, such  as COPD.   Exercise: -Discuss the importance of exercise, FITT principles of exercise, normal and abnormal responses to exercise, and how to exercise safely.   Environmental Irritants: -Discuss types of environmental irritants and how to limit exposure to environmental irritants.   Meds/Inhalers and oxygen : - Discuss respiratory medications, definition of an inhaler and oxygen , and the proper way to use an inhaler and oxygen .   Energy Saving Techniques: - Discuss methods to conserve energy and decrease shortness of breath when performing activities of daily living.    Bronchial Hygiene / Breathing Techniques: - Discuss breathing mechanics, pursed-lip breathing technique,  proper posture, effective ways to clear airways, and other functional breathing techniques   Cleaning Equipment: - Provides group verbal  and written instruction about the health risks of elevated stress, cause of high stress, and healthy ways to reduce stress.   Nutrition I: Fats: - Discuss the types of cholesterol, what cholesterol does to the body, and how cholesterol levels can be controlled.   Nutrition II: Labels: -Discuss the different components of food labels and how to read food labels.   Respiratory Infections: - Discuss the signs and symptoms of respiratory infections, ways to prevent respiratory infections, and the importance of seeking medical treatment when having a respiratory infection.   Stress I: Signs and Symptoms: - Discuss the causes of stress, how stress may lead to anxiety and depression, and ways to limit stress. Flowsheet Row PULMONARY REHAB CHRONIC OBSTRUCTIVE PULMONARY DISEASE from 02/04/2024 in [facility]  Date 02/04/24  Educator [facility]  Instruction Review Code 1- Verbalizes Understanding       Stress II: Relaxation: -Discuss relaxation techniques to limit stress. Flowsheet Row PULMONARY REHAB CHRONIC OBSTRUCTIVE PULMONARY DISEASE from 02/04/2024 in  [facility] [facility]  Date 02/04/24  Educator [facility]  Instruction Review Code 1- Verbalizes Understanding       Oxygen  for Home/Travel: - Discuss how to prepare for travel when on oxygen  and proper ways to transport and store oxygen  to ensure safety.   Knowledge Questionnaire Score:  Knowledge Questionnaire Score - 02/02/24 1638       Knowledge Questionnaire Score   Pre Score 15/18             Core Components/Risk Factors/Patient Goals at Admission:  Personal Goals and Risk Factors at Admission - 02/02/24 1639       Core Components/Risk Factors/Patient Goals on Admission    Weight Management Yes;Weight Maintenance    Intervention Weight Management: Develop a combined nutrition and exercise program designed to reach desired caloric intake, while maintaining appropriate intake of nutrient and fiber, sodium and fats, and appropriate energy expenditure required for the weight goal.;Weight Management: Provide education and appropriate resources to help participant work on and attain dietary goals.    Admit Weight 149 lb 9.6 oz (67.9 kg)    Goal Weight: Short Term 150 lb (68 kg)    Goal Weight: Long Term 150 lb (68 kg)    Expected Outcomes Short Term: Continue to assess and modify interventions until short term weight is achieved;Long Term: Adherence to nutrition and physical activity/exercise program aimed toward attainment of established weight goal;Weight Maintenance: Understanding of the daily nutrition guidelines, which includes 25-35% calories from fat, 7% or less cal from saturated fats, less than 200mg  cholesterol, less than 1.5gm of sodium, & 5 or more servings of fruits and vegetables daily    Improve shortness of breath with ADL's Yes    Intervention Provide education, individualized exercise plan and daily activity instruction to help decrease symptoms of SOB with activities of daily living.    Expected Outcomes Short Term: Improve cardiorespiratory fitness to  achieve a reduction of symptoms when performing ADLs;Long Term: Be able to perform more ADLs without symptoms or delay the onset of symptoms    Increase knowledge of respiratory medications and ability to use respiratory devices properly  Yes    Intervention Provide education and demonstration as needed of appropriate use of medications, inhalers, and oxygen  therapy.    Expected Outcomes Short Term: Achieves understanding of medications use. Understands that oxygen  is a medication prescribed by physician. Demonstrates appropriate use of inhaler and oxygen  therapy.;Long Term: Maintain appropriate use of medications, inhalers, and oxygen  therapy.  Hypertension Yes    Intervention Provide education on lifestyle modifcations including regular physical activity/exercise, weight management, moderate sodium restriction and increased consumption of fresh fruit, vegetables, and low fat dairy, alcohol moderation, and smoking cessation.;Monitor prescription use compliance.    Expected Outcomes Short Term: Continued assessment and intervention until BP is < 140/91mm HG in hypertensive participants. < 130/6mm HG in hypertensive participants with diabetes, heart failure or chronic kidney disease.;Long Term: Maintenance of blood pressure at goal levels.    Lipids Yes    Intervention Provide education and support for participant on nutrition & aerobic/resistive exercise along with prescribed medications to achieve LDL 70mg , HDL >40mg .    Expected Outcomes Short Term: Participant states understanding of desired cholesterol values and is compliant with medications prescribed. Participant is following exercise prescription and nutrition guidelines.;Long Term: Cholesterol controlled with medications as prescribed, with individualized exercise RX and with personalized nutrition plan. Value goals: LDL < 70mg , HDL > 40 mg.             Core Components/Risk Factors/Patient Goals Review:    Core Components/Risk  Factors/Patient Goals at Discharge (Final Review):    ITP Comments:  ITP Comments     Row Name 01/30/24 1011 02/02/24 1630 02/04/24 0913 02/11/24 0828     ITP Comments Completed virtual orientation today in person as he came to see program prior to signing up.  EP evaluation is scheduled for Monday May 12 at 1300 .  Documentation for diagnosis can be found in [facility] encounter 01/26/24. Patient attend orientation today.  Patient is attending Pulmonary Rehabilitation Program.  Documentation for diagnosis can be found in [facility] OV 01/26/24.  Reviewed medical chart, RPE/RPD, gym safety, and program guidelines.  Patient was fitted to equipment they will be using during rehab.  Patient is scheduled to start exercise on Wed 5/14 at 915.   Initial ITP created and sent for review and signature by Dr. Gwendalyn Lemma, Medical Director for Pulmonary Rehabilitation Program. First full day of exercise!  Patient was oriented to gym and equipment including functions, settings, policies, and procedures.  Patient's individual exercise prescription and treatment plan were reviewed.  All starting workloads were established based on the results of the 6 minute walk test done at initial orientation visit.  The plan for exercise progression was also introduced and progression will be customized based on patient's performance and goals. 30 day review completed. ITP sent to Dr.Jehanzeb Memon, Medical Director of  Pulmonary Rehab. Continue with ITP unless changes are made by physician.  New to program.             Comments: 30 day review

## 2024-02-11 NOTE — Progress Notes (Signed)
 Daily Session Note  Patient Details  Name: John [name] MRN: [id number] Date of Birth: 11-02-1955 Referring Provider:   Flowsheet Row PULMONARY REHAB COPD ORIENTATION from 02/02/2024 in [facility]  Referring Provider Celene Coins MD       Encounter Date: 02/11/2024  Check In:  Session Check In - 02/11/24 0959       Check-In   Supervising physician immediately available to respond to emergencies See telemetry face sheet for immediately available MD    Location [facility]    Staff Present Clotilda Danish, BS, Exercise Physiologist;Brooke Rollin Clock, RN    Virtual Visit No    Medication changes reported     No    Fall or balance concerns reported    No    Warm-up and Cool-down Performed on first and last piece of equipment    Resistance Training Performed Yes    VAD Patient? No    PAD/SET Patient? No      Pain Assessment   Currently in Pain? No/denies             Capillary Blood Glucose: No results found for this or any previous visit (from the past 24 hours).    Social History   Tobacco Use  Smoking Status Former   Current packs/day: 0.00   Average packs/day: 2.0 packs/day for 51.6 years (103.1 ttl pk-yrs)   Types: Cigarettes   Start date: 30   Quit date: 04/18/2020   Years since quitting: 3.8  Smokeless Tobacco Never  Tobacco Comments   2 plus packs per day. He has a 100 + pack - year history of tobacco abuse currently. Former 4 ppd for 25 years.    Goals Met:  Proper associated with RPD/PD & O2 Sat Independence with exercise equipment Using PLB without cueing & demonstrates good technique Exercise tolerated well No report of concerns or symptoms today Strength training completed today  Goals Unmet:  Not Applicable  Comments: Pt able to follow exercise prescription today without complaint.  Will continue to monitor for progression.

## 2024-02-17 DIAGNOSIS — J9610 Chronic respiratory failure, unspecified whether with hypoxia or hypercapnia: Secondary | ICD-10-CM | POA: Diagnosis not present

## 2024-02-17 DIAGNOSIS — J449 Chronic obstructive pulmonary disease, unspecified: Secondary | ICD-10-CM | POA: Diagnosis not present

## 2024-02-18 ENCOUNTER — Encounter (HOSPITAL_COMMUNITY)
Admission: RE | Admit: 2024-02-18 | Discharge: 2024-02-18 | Disposition: A | Discharge status: Home / Self Care | Source: Ambulatory Visit | Attending: Pulmonary Disease

## 2024-02-18 DIAGNOSIS — J4489 Other specified chronic obstructive pulmonary disease: Secondary | ICD-10-CM

## 2024-02-18 DIAGNOSIS — J439 Emphysema, unspecified: Secondary | ICD-10-CM

## 2024-02-18 NOTE — Progress Notes (Signed)
 Daily Session Note  Patient Details  Name: John [name] MRN: [id number] Date of Birth: [birth year] Referring Provider:   Flowsheet Row PULMONARY REHAB COPD ORIENTATION from 02/02/2024 in [facility]  Referring Provider Celene Coins MD       Encounter Date: 02/18/2024  Check In:  Session Check In - 02/18/24 0920       Check-In   Supervising physician immediately available to respond to emergencies See telemetry face sheet for immediately available MD    Location [facility]    Staff Present Clotilda Danish, BS, Exercise Physiologist;Jessica Zoila Hines, MA, RCEP, CCRP, CCET    Virtual Visit No    Medication changes reported     No    Fall or balance concerns reported    No    Tobacco Cessation No Change    Warm-up and Cool-down Performed on first and last piece of equipment    Resistance Training Performed Yes    VAD Patient? No    PAD/SET Patient? No      Pain Assessment   Currently in Pain? No/denies    Multiple Pain Sites No             Capillary Blood Glucose: No results found for this or any previous visit (from the past 24 hours).    Social History   Tobacco Use  Smoking Status Former   Current packs/day: 0.00   Average packs/day: 2.0 packs/day for 51.6 years (103.1 ttl pk-yrs)   Types: Cigarettes   Start date: 48   Quit date: 04/18/2020   Years since quitting: 3.8  Smokeless Tobacco Never  Tobacco Comments   2 plus packs per day. He has a 100 + pack - year history of tobacco abuse currently. Former 4 ppd for 25 years.    Goals Met:  Independence with exercise equipment Exercise tolerated well No report of concerns or symptoms today Strength training completed today  Goals Unmet:  Not Applicable  Comments: Pt able to follow exercise prescription today without complaint.  Will continue to monitor for progression.

## 2024-02-23 ENCOUNTER — Encounter (HOSPITAL_COMMUNITY)
Admission: RE | Admit: 2024-02-23 | Discharge: 2024-02-23 | Disposition: A | Discharge status: Home / Self Care | Source: Ambulatory Visit | Attending: Pulmonary Disease | Admitting: Pulmonary Disease

## 2024-02-23 DIAGNOSIS — J4489 Other specified chronic obstructive pulmonary disease: Secondary | ICD-10-CM | POA: Diagnosis not present

## 2024-02-23 DIAGNOSIS — J439 Emphysema, unspecified: Secondary | ICD-10-CM | POA: Diagnosis not present

## 2024-02-23 NOTE — Progress Notes (Signed)
 Daily Session Note  Patient Details  Name: John [name] MRN: [id number] Date of Birth: Sep 23, 1956 Referring Provider:   Flowsheet Row PULMONARY REHAB COPD ORIENTATION from 02/02/2024 in [facility]  Referring Provider Celene Coins MD       Encounter Date: 02/23/2024  Check In:  Session Check In - 02/23/24 0915       Check-In   Supervising physician immediately available to respond to emergencies See telemetry face sheet for immediately available MD    Location [facility]    Staff Present Jerrol Morelle, BSN, RN, WTA-C;Heather Toy Freund, BS, Exercise Physiologist    Virtual Visit No    Medication changes reported     No    Fall or balance concerns reported    No    Tobacco Cessation No Change    Warm-up and Cool-down Performed on first and last piece of equipment    Resistance Training Performed Yes    VAD Patient? No    PAD/SET Patient? No      Pain Assessment   Currently in Pain? No/denies             Capillary Blood Glucose: No results found for this or any previous visit (from the past 24 hours).    Social History   Tobacco Use  Smoking Status Former   Current packs/day: 0.00   Average packs/day: 2.0 packs/day for 51.6 years (103.1 ttl pk-yrs)   Types: Cigarettes   Start date: 60   Quit date: 04/18/2020   Years since quitting: 3.8  Smokeless Tobacco Never  Tobacco Comments   2 plus packs per day. He has a 100 + pack - year history of tobacco abuse currently. Former 4 ppd for 25 years.    Goals Met:  Proper associated with RPD/PD & O2 Sat Independence with exercise equipment Improved SOB with ADL's Using PLB without cueing & demonstrates good technique Exercise tolerated well No report of concerns or symptoms today Strength training completed today  Goals Unmet:  Not Applicable  Comments: Pt able to follow exercise prescription today without complaint.  Will continue to monitor for  progression.

## 2024-02-25 ENCOUNTER — Encounter (HOSPITAL_COMMUNITY)
Admission: RE | Admit: 2024-02-25 | Discharge: 2024-02-25 | Disposition: A | Discharge status: Home / Self Care | Source: Ambulatory Visit | Attending: Pulmonary Disease | Admitting: Pulmonary Disease

## 2024-02-25 DIAGNOSIS — J4489 Other specified chronic obstructive pulmonary disease: Secondary | ICD-10-CM | POA: Diagnosis not present

## 2024-02-25 DIAGNOSIS — J439 Emphysema, unspecified: Secondary | ICD-10-CM

## 2024-02-25 NOTE — Progress Notes (Signed)
 Daily Session Note  Patient Details  Name: John [name] MRN: [id number] Date of Birth: Feb 14, 1956 Referring Provider:   Flowsheet Row PULMONARY REHAB COPD ORIENTATION from 02/02/2024 in [facility]  Referring Provider Celene Coins MD       Encounter Date: 02/25/2024  Check In:  Session Check In - 02/25/24 0930       Check-In   Supervising physician immediately available to respond to emergencies See telemetry face sheet for immediately available MD    Location [facility]    Staff Present Clotilda Danish, BS, Exercise Physiologist;Keosha Rossa Eilene Grater, RN;Hillary Lennie Ra BSN, RN    Virtual Visit No    Medication changes reported     No    Fall or balance concerns reported    No    Warm-up and Cool-down Performed on first and last piece of equipment    Resistance Training Performed Yes    VAD Patient? No    PAD/SET Patient? No      Pain Assessment   Currently in Pain? No/denies             Capillary Blood Glucose: No results found for this or any previous visit (from the past 24 hours).    Social History   Tobacco Use  Smoking Status Former   Current packs/day: 0.00   Average packs/day: 2.0 packs/day for 51.6 years (103.1 ttl pk-yrs)   Types: Cigarettes   Start date: 69   Quit date: 04/18/2020   Years since quitting: 3.8  Smokeless Tobacco Never  Tobacco Comments   2 plus packs per day. He has a 100 + pack - year history of tobacco abuse currently. Former 4 ppd for 25 years.    Goals Met:  Independence with exercise equipment Exercise tolerated well No report of concerns or symptoms today  Goals Unmet:  Not Applicable  Comments: Pt able to follow exercise prescription today without complaint.  Will continue to monitor for progression.

## 2024-02-26 DIAGNOSIS — H5203 Hypermetropia, bilateral: Secondary | ICD-10-CM | POA: Diagnosis not present

## 2024-03-01 ENCOUNTER — Other Ambulatory Visit: Payer: Self-pay

## 2024-03-01 ENCOUNTER — Encounter (HOSPITAL_COMMUNITY)
Admission: RE | Admit: 2024-03-01 | Discharge: 2024-03-01 | Disposition: A | Discharge status: Home / Self Care | Source: Ambulatory Visit | Attending: Pulmonary Disease

## 2024-03-01 DIAGNOSIS — J4489 Other specified chronic obstructive pulmonary disease: Secondary | ICD-10-CM

## 2024-03-01 DIAGNOSIS — J439 Emphysema, unspecified: Secondary | ICD-10-CM

## 2024-03-01 DIAGNOSIS — Z8679 Personal history of other diseases of the circulatory system: Secondary | ICD-10-CM

## 2024-03-01 NOTE — Progress Notes (Signed)
 Daily Session Note  Patient Details  Name: John [name] MRN: [id number] Date of Birth: 04-27-1956 Referring Provider:   Flowsheet Row PULMONARY REHAB COPD ORIENTATION from 02/02/2024 in [facility]  Referring Provider Celene Coins MD       Encounter Date: 03/01/2024  Check In:  Session Check In - 03/01/24 0915       Check-In   Supervising physician immediately available to respond to emergencies See telemetry face sheet for immediately available MD    Location [facility]    Staff Present Jerrol Morelle, BSN, RN, Aggie Horton, MA, RCEP, CCRP, CCET    Virtual Visit No    Medication changes reported     No    Fall or balance concerns reported    No    Tobacco Cessation No Change    Warm-up and Cool-down Performed on first and last piece of equipment    Resistance Training Performed Yes    VAD Patient? No    PAD/SET Patient? No      Pain Assessment   Currently in Pain? No/denies             Capillary Blood Glucose: No results found for this or any previous visit (from the past 24 hours).    Social History   Tobacco Use  Smoking Status Former   Current packs/day: 0.00   Average packs/day: 2.0 packs/day for 51.6 years (103.1 ttl pk-yrs)   Types: Cigarettes   Start date: 12   Quit date: 04/18/2020   Years since quitting: 3.8  Smokeless Tobacco Never  Tobacco Comments   2 plus packs per day. He has a 100 + pack - year history of tobacco abuse currently. Former 4 ppd for 25 years.    Goals Met:  Proper associated with RPD/PD & O2 Sat Independence with exercise equipment Improved SOB with ADL's Using PLB without cueing & demonstrates good technique Exercise tolerated well No report of concerns or symptoms today Strength training completed today  Goals Unmet:  Not Applicable  Comments: Pt able to follow exercise prescription today without complaint.  Will continue to monitor for progression.

## 2024-03-03 ENCOUNTER — Encounter (HOSPITAL_COMMUNITY)
Admission: RE | Admit: 2024-03-03 | Discharge: 2024-03-03 | Disposition: A | Discharge status: Home / Self Care | Source: Ambulatory Visit | Attending: Pulmonary Disease | Admitting: Pulmonary Disease

## 2024-03-03 DIAGNOSIS — J4489 Other specified chronic obstructive pulmonary disease: Secondary | ICD-10-CM

## 2024-03-03 DIAGNOSIS — J439 Emphysema, unspecified: Secondary | ICD-10-CM | POA: Diagnosis not present

## 2024-03-03 NOTE — Progress Notes (Signed)
 Daily Session Note  Patient Details  Name: John [name] MRN: [id number] Date of Birth: Jan 21, 1956 Referring Provider:   Flowsheet Row PULMONARY REHAB COPD ORIENTATION from 02/02/2024 in [facility] RE[facility]  Referring Provider Celene Coins MD       Encounter Date: 03/03/2024  Check In:  Session Check In - 03/03/24 0915       Check-In   Supervising physician immediately available to respond to emergencies See telemetry face sheet for immediately available MD    Location [facility]    Staff Present Clotilda Danish, BS, Exercise Physiologist;Jessica Zoila Hines, MA, RCEP, CCRP, CCET;Hillary Troutman BSN, RN    Virtual Visit No    Medication changes reported     No    Fall or balance concerns reported    No    Tobacco Cessation No Change    Warm-up and Cool-down Performed on first and last piece of equipment    Resistance Training Performed Yes    VAD Patient? No    PAD/SET Patient? No      Pain Assessment   Currently in Pain? No/denies             Capillary Blood Glucose: No results found for this or any previous visit (from the past 24 hours).    Social History   Tobacco Use  Smoking Status Former   Current packs/day: 0.00   Average packs/day: 2.0 packs/day for 51.6 years (103.1 ttl pk-yrs)   Types: Cigarettes   Start date: 65   Quit date: 04/18/2020   Years since quitting: 3.8  Smokeless Tobacco Never  Tobacco Comments   2 plus packs per day. He has a 100 + pack - year history of tobacco abuse currently. Former 4 ppd for 25 years.    Goals Met:  Proper associated with RPD/PD & O2 Sat Independence with exercise equipment Improved SOB with ADL's Using PLB without cueing & demonstrates good technique Exercise tolerated well No report of concerns or symptoms today Strength training completed today  Goals Unmet:  Not Applicable  Comments: Pt able to follow exercise prescription today without complaint.  Will continue to  monitor for progression.

## 2024-03-05 ENCOUNTER — Ambulatory Visit (HOSPITAL_COMMUNITY)
Admission: RE | Admit: 2024-03-05 | Discharge: 2024-03-05 | Disposition: A | Discharge status: Home / Self Care | Source: Ambulatory Visit | Attending: Pulmonary Disease | Admitting: Pulmonary Disease

## 2024-03-05 DIAGNOSIS — R911 Solitary pulmonary nodule: Secondary | ICD-10-CM | POA: Insufficient documentation

## 2024-03-05 DIAGNOSIS — I700 Atherosclerosis of aorta: Secondary | ICD-10-CM | POA: Diagnosis not present

## 2024-03-05 DIAGNOSIS — J439 Emphysema, unspecified: Secondary | ICD-10-CM | POA: Diagnosis not present

## 2024-03-06 ENCOUNTER — Other Ambulatory Visit (HOSPITAL_COMMUNITY)

## 2024-03-08 ENCOUNTER — Encounter (HOSPITAL_COMMUNITY)
Admission: RE | Admit: 2024-03-08 | Discharge: 2024-03-08 | Disposition: A | Discharge status: Home / Self Care | Source: Ambulatory Visit | Attending: Pulmonary Disease | Admitting: Pulmonary Disease

## 2024-03-10 ENCOUNTER — Encounter (HOSPITAL_COMMUNITY)
Admission: RE | Admit: 2024-03-10 | Discharge: 2024-03-10 | Disposition: A | Discharge status: Home / Self Care | Source: Ambulatory Visit | Attending: Pulmonary Disease | Admitting: Pulmonary Disease

## 2024-03-10 DIAGNOSIS — J4489 Other specified chronic obstructive pulmonary disease: Secondary | ICD-10-CM | POA: Diagnosis not present

## 2024-03-10 DIAGNOSIS — J439 Emphysema, unspecified: Secondary | ICD-10-CM

## 2024-03-10 NOTE — Progress Notes (Signed)
 Daily Session Note  Patient Details  Name: John [name] MRN: [id number] Date of Birth: 07/15/1956 Referring Provider:   Flowsheet Row PULMONARY REHAB COPD ORIENTATION from 02/02/2024 in [facility] RE[facility]  Referring Provider Celene Coins MD    Encounter Date: 03/10/2024  Check In:  Session Check In - 03/10/24 0915       Check-In   Supervising physician immediately available to respond to emergencies See telemetry face sheet for immediately available MD    Location [facility]    Staff Present Clotilda Danish, BS, Exercise Physiologist;Brooke Rollin Clock, RN;Hillary Troutman BSN, RN    Virtual Visit No    Medication changes reported     No    Fall or balance concerns reported    No    Tobacco Cessation No Change    Warm-up and Cool-down Performed on first and last piece of equipment    Resistance Training Performed Yes    VAD Patient? No    PAD/SET Patient? No      Pain Assessment   Currently in Pain? No/denies    Multiple Pain Sites No          Capillary Blood Glucose: No results found for this or any previous visit (from the past 24 hours).    Social History   Tobacco Use  Smoking Status Former   Current packs/day: 0.00   Average packs/day: 2.0 packs/day for 51.6 years (103.1 ttl pk-yrs)   Types: Cigarettes   Start date: 40   Quit date: 04/18/2020   Years since quitting: 3.8  Smokeless Tobacco Never  Tobacco Comments   2 plus packs per day. He has a 100 + pack - year history of tobacco abuse currently. Former 4 ppd for 25 years.    Goals Met:  Independence with exercise equipment Exercise tolerated well No report of concerns or symptoms today Strength training completed today  Goals Unmet:  Not Applicable  Comments: Pt able to follow exercise prescription today without complaint.  Will continue to monitor for progression.

## 2024-03-10 NOTE — Progress Notes (Signed)
 Pulmonary Individual Treatment Plan  Patient Details  Name: John [name] MRN: [id number] Date of Birth: Jan 28, 1956 Referring Provider:   Flowsheet Row PULMONARY REHAB COPD ORIENTATION from 02/02/2024 in [facility] CARDIAC REHABILITATION  Referring Provider Celene Coins MD    Initial Encounter Date:  Flowsheet Row PULMONARY REHAB COPD ORIENTATION from 02/02/2024 in [place] CARDIAC REHABILITATION  Date 02/02/24    Visit Diagnosis: No diagnosis found.  Patient's Home Medications on Admission:   Current Outpatient Medications:    albuterol  (PROAIR  HFA) 108 (90 Base) MCG/ACT inhaler, Inhale 2 puffs into the lungs every 4 (four) hours as needed for wheezing or shortness of breath. 2 puffs every 4 hours as needed only  if your can't catch your breath, Disp: 8 g, Rfl: 5   albuterol  (PROVENTIL ) (2.5 MG/3ML) 0.083% nebulizer solution, Take 3 mLs (2.5 mg total) by nebulization every 4 (four) hours as needed for wheezing or shortness of breath., Disp: 360 mL, Rfl: 5   aspirin  EC 81 MG tablet, Take 81 mg by mouth daily., Disp: , Rfl:    atorvastatin  (LIPITOR ) 40 MG tablet, Take 1 tablet (40 mg total) by mouth daily., Disp: 90 tablet, Rfl: 3   Budeson-Glycopyrrol-Formoterol  (BREZTRI  AEROSPHERE) 160-9-4.8 MCG/ACT AERO, Inhale 2 puffs into the lungs in the morning and at bedtime., Disp: 10.7 g, Rfl: 11   furosemide  (LASIX ) 40 MG tablet, Take 1 tablet (40 mg total) by mouth daily., Disp: 90 tablet, Rfl: 2   guaiFENesin  (MUCINEX ) 600 MG 12 hr tablet, Take 2 tablets (1,200 mg total) by mouth 2 (two) times daily as needed for cough or to loosen phlegm., Disp: , Rfl:    Multiple Vitamin (MULTIVITAMIN WITH MINERALS) TABS tablet, Take 1 tablet by mouth daily., Disp: , Rfl:    nitroGLYCERIN  (NITROSTAT ) 0.4 MG SL tablet, Place 1 tablet (0.4 mg total) under the tongue every 5 (five) minutes as needed for chest pain., Disp: 25 tablet, Rfl: 6   OXYGEN , Inhale 2-3 L into the lungs., Disp: , Rfl:   Past  Medical History: Past Medical History:  Diagnosis Date   Alcohol abuse 12/27/2010   Allergic rhinitis, cause unspecified 12/27/2010   Anal warts 04/11/2012   CAD, NATIVE VESSEL 10/04/2010   COPD (chronic obstructive pulmonary disease) (HCC)    a touch (09/28/2013)   History of blood transfusion 03/2012   related to nose bleed   HYPERLIPIDEMIA-MIXED 10/04/2010   HYPERTENSION, BENIGN 10/04/2010   Myocardial infarction (HCC) 09/16/2010   Pneumonia    when I was a kid   Stented coronary artery    Mid LAD    TOBACCO ABUSE 10/04/2010    Tobacco Use: Social History   Tobacco Use  Smoking Status Former   Current packs/day: 0.00   Average packs/day: 2.0 packs/day for 51.6 years (103.1 ttl pk-yrs)   Types: Cigarettes   Start date: 16   Quit date: 04/18/2020   Years since quitting: 3.8  Smokeless Tobacco Never  Tobacco Comments   2 plus packs per day. He has a 100 + pack - year history of tobacco abuse currently. Former 4 ppd for 25 years.    Labs: Review Flowsheet  More data exists      Latest Ref Rng & Units 02/12/2021 03/22/2022 07/28/2022 10/14/2022 10/21/2023  Labs for ITP Cardiac and Pulmonary Rehab  Cholestrol 100 - 199 mg/dL - 119  - 147  829   LDL (calc) 0 - 99 mg/dL - 76  - 72  81   HDL-C >39 mg/dL -  77  - 76  86   Trlycerides 0 - 149 mg/dL - 70  - 55  57   PH, Arterial 7.35 - 7.45 7.283  - 7.265  - -  PCO2 arterial 32 - 48 mmHg 80.9  - 80.0  - -  Bicarbonate 20.0 - 28.0 mmol/L 31.4  - 36.7  - -  TCO2 22 - 32 mmol/L - - 39  - -  O2 Saturation % 97.2  - 97  - -    Capillary Blood Glucose: Lab Results  Component Value Date   GLUCAP 185 (H) 04/25/2020   GLUCAP 80 04/25/2020   GLUCAP 80 04/25/2020   GLUCAP 131 (H) 04/24/2020   GLUCAP 176 (H) 04/24/2020     Pulmonary Assessment Scores:  Pulmonary Assessment Scores     Row Name 02/02/24 1640         ADL UCSD   ADL Phase Entry     SOB Score total 60     Rest 0     Walk 3     Stairs 4     Bath 4     Dress 2      Shop 3       CAT Score   CAT Score 17       mMRC Score   mMRC Score 4       UCSD: Self-administered rating of dyspnea associated with activities of daily living (ADLs) 6-point scale (0 = not at all to 5 = maximal or unable to do because of breathlessness)  Scoring Scores range from 0 to 120.  Minimally important difference is 5 units  CAT: CAT can identify the health impairment of COPD patients and is better correlated with disease progression.  CAT has a scoring range of zero to 40. The CAT score is classified into four groups of low (less than 10), medium (10 - 20), high (21-30) and very high (31-40) based on the impact level of disease on health status. A CAT score over 10 suggests significant symptoms.  A worsening CAT score could be explained by an exacerbation, poor medication adherence, poor inhaler technique, or progression of COPD or comorbid conditions.  CAT MCID is 2 points  mMRC: mMRC (Modified Medical Research Council) Dyspnea Scale is used to assess the degree of baseline functional disability in patients of respiratory disease due to dyspnea. No minimal important difference is established. A decrease in score of 1 point or greater is considered a positive change.   Pulmonary Function Assessment:  Pulmonary Function Assessment - 02/02/24 1643       Initial Spirometry Results   FVC% 34 %    FEV1% 58 %    FEV1/FVC Ratio 35    Comments test 12/09/19      Post Bronchodilator Spirometry Results   FVC% 33 %    FEV1% 59 %    FEV1/FVC Ratio 37    Comments test 12/09/19      Breath   Shortness of Breath Yes;Fear of Shortness of Breath;Limiting activity;Panic with Shortness of Breath          Exercise Target Goals: Exercise Program Goal: Individual exercise prescription set using results from initial 6 min walk test and THRR while considering  patient's activity barriers and safety.   Exercise Prescription Goal: Initial exercise prescription builds to 30-45  minutes a day of aerobic activity, 2-3 days per week.  Home exercise guidelines will be given to patient during program as part of exercise prescription that the  participant will acknowledge.  Activity Barriers & Risk Stratification:  Activity Barriers & Cardiac Risk Stratification - 01/30/24 0924       Activity Barriers & Cardiac Risk Stratification   Activity Barriers Deconditioning;Muscular Weakness;Shortness of Breath          6 Minute Walk:  6 Minute Walk     Row Name 02/02/24 1630         6 Minute Walk   Phase Initial     Distance 300 feet     Walk Time 3.33 minutes     # of Rest Breaks 5  21 sec, 36 sec, 20 sec, 53 sec, 30 sec (all standing rest)     MPH 1.02     METS 2.08     RPE 15     Perceived Dyspnea  3     VO2 Peak 7.3     Symptoms Yes (comment)     Comments SOB, chest tightness 6/10 hard to breathe     Resting HR 98 bpm     Resting BP 126/74     Resting Oxygen  Saturation  98 %     Exercise Oxygen  Saturation  during 6 min walk 93 %     Max Ex. HR 125 bpm     Max Ex. BP 142/84     2 Minute Post BP 132/64       Interval HR   1 Minute HR 125     2 Minute HR 117     3 Minute HR 109     4 Minute HR 114     5 Minute HR 110     6 Minute HR 112     2 Minute Post HR 105     Interval Heart Rate? Yes       Interval Oxygen    Interval Oxygen ? Yes     Baseline Oxygen  Saturation % 98 %  3L NCC     1 Minute Oxygen  Saturation % 95 %     1 Minute Liters of Oxygen  4 L  NCC     2 Minute Oxygen  Saturation % 97 %     2 Minute Liters of Oxygen  4 L     3 Minute Oxygen  Saturation % 95 %     3 Minute Liters of Oxygen  4 L     4 Minute Oxygen  Saturation % 97 %     4 Minute Liters of Oxygen  4 L     5 Minute Oxygen  Saturation % 98 %     5 Minute Liters of Oxygen  4 L     6 Minute Oxygen  Saturation % 93 %     6 Minute Liters of Oxygen  4 L     2 Minute Post Oxygen  Saturation % 97 %     2 Minute Post Liters of Oxygen  4 L        Oxygen  Initial Assessment:  Oxygen   Initial Assessment - 01/30/24 0921       Home Oxygen    Home Oxygen  Device Home Concentrator;E-Tanks;Portable Concentrator    Sleep Oxygen  Prescription Continuous   ventilator machine for 5 hrs a day   Liters per minute 3    Home Exercise Oxygen  Prescription Continuous    Liters per minute 3   4 liters for shower and walking outside (up hill)   Home Resting Oxygen  Prescription Continuous    Liters per minute 3    Compliance with Home Oxygen  Use Yes   usually compliant  Initial 6 min Walk   Oxygen  Used E-Tanks;Continuous    Liters per minute 4      Program Oxygen  Prescription   Program Oxygen  Prescription E-Tanks;Continuous    Liters per minute 4      Intervention   Short Term Goals To learn and exhibit compliance with exercise, home and travel O2 prescription;To learn and understand importance of monitoring SPO2 with pulse oximeter and demonstrate accurate use of the pulse oximeter.;To learn and understand importance of maintaining oxygen  saturations>88%;To learn and demonstrate proper pursed lip breathing techniques or other breathing techniques. ;To learn and demonstrate proper use of respiratory medications    Long  Term Goals Exhibits compliance with exercise, home  and travel O2 prescription;Maintenance of O2 saturations>88%;Compliance with respiratory medication;Verbalizes importance of monitoring SPO2 with pulse oximeter and return demonstration;Exhibits proper breathing techniques, such as pursed lip breathing or other method taught during program session;Demonstrates proper use of MDI's          Oxygen  Re-Evaluation:   Oxygen  Discharge (Final Oxygen  Re-Evaluation):   Initial Exercise Prescription:  Initial Exercise Prescription - 02/02/24 1600       Date of Initial Exercise RX and Referring Provider   Date 02/02/24    Referring Provider Celene Coins MD      Oxygen    Oxygen  Continuous    Liters 4    Maintain Oxygen  Saturation 88% or higher      NuStep    Level 1    SPM 60    Minutes 30    METs 1.2      Track   Laps 2    Minutes 5    METs 1      Prescription Details   Frequency (times per week) 2    Duration Progress to 30 minutes of continuous aerobic without signs/symptoms of physical distress      Intensity   THRR 40-80% of Max Heartrate 120-142    Ratings of Perceived Exertion 11-13    Perceived Dyspnea 0-4      Progression   Progression Continue to progress workloads to maintain intensity without signs/symptoms of physical distress.      Resistance Training   Training Prescription Yes    Weight 3 lb    Reps 10-15          Perform Capillary Blood Glucose checks as needed.  Exercise Prescription Changes:   Exercise Prescription Changes     Row Name 02/02/24 1600 02/04/24 1300 02/18/24 1300 03/01/24 1500       Response to Exercise   Blood Pressure (Admit) 126/74 128/58 110/60 128/74    Blood Pressure (Exercise) 142/84 134/70 120/60 130/60    Blood Pressure (Exit) 132/72 120/62 112/66 134/60    Heart Rate (Admit) 98 bpm 106 bpm 110 bpm 120 bpm    Heart Rate (Exercise) 125 bpm 106 bpm 116 bpm 124 bpm    Heart Rate (Exit) 104 bpm 100 bpm 116 bpm 120 bpm    Oxygen  Saturation (Admit) 98 % 94 % 91 % 90 %    Oxygen  Saturation (Exercise) 93 % 100 % 96 % 91 %    Oxygen  Saturation (Exit) 97 % 98 % 94 % 93 %    Rating of Perceived Exertion (Exercise) 15 15 14 15     Perceived Dyspnea (Exercise) 3 2 3 3     Symptoms standing rest breaks, SOB, chest tightness 6/10 (normal) -- -- --    Comments walk test results -- -- --    Duration --  Continue with 30 min of aerobic exercise without signs/symptoms of physical distress. Continue with 30 min of aerobic exercise without signs/symptoms of physical distress. Continue with 30 min of aerobic exercise without signs/symptoms of physical distress.    Intensity -- THRR unchanged THRR unchanged THRR unchanged      Progression   Progression -- Continue to progress workloads to maintain  intensity without signs/symptoms of physical distress. Continue to progress workloads to maintain intensity without signs/symptoms of physical distress. Continue to progress workloads to maintain intensity without signs/symptoms of physical distress.      Resistance Training   Training Prescription -- Yes Yes Yes    Weight -- 3 3 3     Reps -- 10-15 10-15 10-15      Oxygen    Oxygen  -- Continuous Continuous Continuous    Liters -- 4 4 3       NuStep   Level -- 1 1 --    SPM -- 36 45 --    Minutes -- 30 30 --    METs -- 1.3 1.5 --      Oxygen    Maintain Oxygen  Saturation -- 88% or higher 88% or higher --       Exercise Comments:   Exercise Goals and Review:   Exercise Goals     Row Name 02/02/24 1636             Exercise Goals   Increase Physical Activity Yes       Intervention Provide advice, education, support and counseling about physical activity/exercise needs.;Develop an individualized exercise prescription for aerobic and resistive training based on initial evaluation findings, risk stratification, comorbidities and participant's personal goals.       Expected Outcomes Short Term: Attend rehab on a regular basis to increase amount of physical activity.;Long Term: Add in home exercise to make exercise part of routine and to increase amount of physical activity.;Long Term: Exercising regularly at least 3-5 days a week.       Increase Strength and Stamina Yes       Intervention Provide advice, education, support and counseling about physical activity/exercise needs.;Develop an individualized exercise prescription for aerobic and resistive training based on initial evaluation findings, risk stratification, comorbidities and participant's personal goals.       Expected Outcomes Short Term: Increase workloads from initial exercise prescription for resistance, speed, and METs.;Short Term: Perform resistance training exercises routinely during rehab and add in resistance training  at home;Long Term: Improve cardiorespiratory fitness, muscular endurance and strength as measured by increased METs and functional capacity ( )       Able to understand and use rate of perceived exertion (RPE) scale Yes       Intervention Provide education and explanation on how to use RPE scale       Expected Outcomes Long Term:  Able to use RPE to guide intensity level when exercising independently;Short Term: Able to use RPE daily in rehab to express subjective intensity level       Able to understand and use Dyspnea scale Yes       Intervention Provide education and explanation on how to use Dyspnea scale       Expected Outcomes Short Term: Able to use Dyspnea scale daily in rehab to express subjective sense of shortness of breath during exertion;Long Term: Able to use Dyspnea scale to guide intensity level when exercising independently       Knowledge and understanding of Target Heart Rate Range (THRR) Yes  Intervention Provide education and explanation of THRR including how the numbers were predicted and where they are located for reference       Expected Outcomes Short Term: Able to state/look up THRR;Short Term: Able to use daily as guideline for intensity in rehab;Long Term: Able to use THRR to govern intensity when exercising independently       Able to check pulse independently Yes       Intervention Provide education and demonstration on how to check pulse in carotid and radial arteries.;Review the importance of being able to check your own pulse for safety during independent exercise       Expected Outcomes Short Term: Able to explain why pulse checking is important during independent exercise;Long Term: Able to check pulse independently and accurately       Understanding of Exercise Prescription Yes       Intervention Provide education, explanation, and written materials on patient's individual exercise prescription       Expected Outcomes Short Term: Able to explain program  exercise prescription;Long Term: Able to explain home exercise prescription to exercise independently          Exercise Goals Re-Evaluation :  Exercise Goals Re-Evaluation     Row Name 03/03/24 1151             Exercise Goal Re-Evaluation   Exercise Goals Review Increase Physical Activity;Increase Strength and Stamina;Understanding of Exercise Prescription       Comments John [name] is doing well in rehab. He has not increased his level on the nustep but his SPM are increasing slightly. He is exercsing at a RPE of 13. He stated that he does feel a difference in his energy levels after exercise. We talking about energy saviing techniques when doing activities such as grocery shopping.       Expected Outcomes Short: practice energy techniques   long term: continue to attend rehab.          Discharge Exercise Prescription (Final Exercise Prescription Changes):  Exercise Prescription Changes - 03/01/24 1500       Response to Exercise   Blood Pressure (Admit) 128/74    Blood Pressure (Exercise) 130/60    Blood Pressure (Exit) 134/60    Heart Rate (Admit) 120 bpm    Heart Rate (Exercise) 124 bpm    Heart Rate (Exit) 120 bpm    Oxygen  Saturation (Admit) 90 %    Oxygen  Saturation (Exercise) 91 %    Oxygen  Saturation (Exit) 93 %    Rating of Perceived Exertion (Exercise) 15    Perceived Dyspnea (Exercise) 3    Duration Continue with 30 min of aerobic exercise without signs/symptoms of physical distress.    Intensity THRR unchanged      Progression   Progression Continue to progress workloads to maintain intensity without signs/symptoms of physical distress.      Resistance Training   Training Prescription Yes    Weight 3    Reps 10-15      Oxygen    Oxygen  Continuous    Liters 3          Nutrition:  Target Goals: Understanding of nutrition guidelines, daily intake of sodium 1500mg , cholesterol 200mg , calories 30% from fat and 7% or less from saturated fats, daily to have 5 or  more servings of fruits and vegetables.  Biometrics:  Pre Biometrics - 02/02/24 1637       Pre Biometrics   Height 5' 9 (1.753 m)    Weight 67.9 kg  Waist Circumference 34 inches    Hip Circumference 37.5 inches    Waist to Hip Ratio 0.91 %    BMI (Calculated) 22.08    Grip Strength 37.3 kg    Single Leg Stand 20.9 seconds           Nutrition Therapy Plan and Nutrition Goals:  Nutrition Therapy & Goals - 01/30/24 1007       Intervention Plan   Intervention Prescribe, educate and counsel regarding individualized specific dietary modifications aiming towards targeted core components such as weight, hypertension, lipid management, diabetes, heart failure and other comorbidities.;Nutrition handout(s) given to patient.    Expected Outcomes Short Term Goal: Understand basic principles of dietary content, such as calories, fat, sodium, cholesterol and nutrients.;Long Term Goal: Adherence to prescribed nutrition plan.          Nutrition Assessments:  MEDIFICTS Score Key: >=70 Need to make dietary changes  40-70 Heart Healthy Diet <= 40 Therapeutic Level Cholesterol Diet  Flowsheet Row PULMONARY REHAB COPD ORIENTATION from 02/02/2024 in [facility] CARDIAC REHABILITATION  Picture Your Plate Total Score on Admission 50   Picture Your Plate Scores: <16 Unhealthy dietary pattern with much room for improvement. 41-50 Dietary pattern unlikely to meet recommendations for good health and room for improvement. 51-60 More healthful dietary pattern, with some room for improvement.  >60 Healthy dietary pattern, although there may be some specific behaviors that could be improved.    Nutrition Goals Re-Evaluation:  Nutrition Goals Re-Evaluation     Row Name 03/03/24 1321             Goals   Nutrition Goal Healthy eating       Comment John [name] is doing well in rehab. He stated that he is eating wha he likes to eat and really only eats about two meals a day. He does not drink a lot  of water and we talked about increasing his water intake. He does drink diet sodas throughtout the day.       Expected Outcome Short: increase water intake long term: try to choose healthy options for meals          Nutrition Goals Discharge (Final Nutrition Goals Re-Evaluation):  Nutrition Goals Re-Evaluation - 03/03/24 1321       Goals   Nutrition Goal Healthy eating    Comment John [name] is doing well in rehab. He stated that he is eating wha he likes to eat and really only eats about two meals a day. He does not drink a lot of water and we talked about increasing his water intake. He does drink diet sodas throughtout the day.    Expected Outcome Short: increase water intake long term: try to choose healthy options for meals          Psychosocial: Target Goals: Acknowledge presence or absence of significant depression and/or stress, maximize coping skills, provide positive support system. Participant is able to verbalize types and ability to use techniques and skills needed for reducing stress and depression.  Initial Review & Psychosocial Screening:  Initial Psych Review & Screening - 01/30/24 0925       Initial Review   Current issues with Current Sleep Concerns;Current Stress Concerns    Source of Stress Concerns Chronic Illness;Unable to participate in former interests or hobbies;Unable to perform yard/household activities    Comments chronic bad sleeper, wife wants him to use his ventilator at home regularly      Family Dynamics   Good Support System? Yes  wife, daughter     Barriers   Psychosocial barriers to participate in program The patient should benefit from training in stress management and relaxation.;Psychosocial barriers identified (see note)      Screening Interventions   Interventions Encouraged to exercise;To provide support and resources with identified psychosocial needs;Provide feedback about the scores to participant    Expected Outcomes Short Term goal:  Utilizing psychosocial counselor, staff and physician to assist with identification of specific Stressors or current issues interfering with healing process. Setting desired goal for each stressor or current issue identified.;Long Term Goal: Stressors or current issues are controlled or eliminated.;Short Term goal: Identification and review with participant of any Quality of Life or Depression concerns found by scoring the questionnaire.;Long Term goal: The participant improves quality of Life and PHQ9 Scores as seen by post scores and/or verbalization of changes          Quality of Life Scores:  Scores of 19 and below usually indicate a poorer quality of life in these areas.  A difference of  2-3 points is a clinically meaningful difference.  A difference of 2-3 points in the total score of the Quality of Life Index has been associated with significant improvement in overall quality of life, self-image, physical symptoms, and general health in studies assessing change in quality of life.   PHQ-9: Review Flowsheet  More data exists      01/30/2024 09/10/2023 04/16/2023 10/14/2022 08/08/2022  Depression screen PHQ 2/9  Decreased Interest 0 0 0 0 0  Down, Depressed, Hopeless 0 0 0 0 0  PHQ - 2 Score 0 0 0 0 0  Altered sleeping 0 0 - - -  Tired, decreased energy 2 0 - - -  Change in appetite 1 0 - - -  Feeling bad or failure about yourself  0 0 - - -  Trouble concentrating 0 0 - - -  Moving slowly or fidgety/restless 0 0 - - -  Suicidal thoughts 0 0 - - -  PHQ-9 Score 3 0 - - -  Difficult doing work/chores Not difficult at all Not difficult at all - - -   Interpretation of Total Score  Total Score Depression Severity:  1-4 = Minimal depression, 5-9 = Mild depression, 10-14 = Moderate depression, 15-19 = Moderately severe depression, 20-27 = Severe depression   Psychosocial Evaluation and Intervention:  Psychosocial Evaluation - 01/30/24 1002       Psychosocial Evaluation &  Interventions   Interventions Encouraged to exercise with the program and follow exercise prescription    Comments John [name] is coming into pulmonary rehab for COPD.  He has a long history and has previously been worked up for transplant, but was disqualified after finding an aneursym.  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  He lives with his wife and has his daughter and her son to check on them frequently.  They have no pets or environmental concerns at home. He does use a scooter to get around due to his SOB.  His wife will help him in the shower, but otherwise he is independently with ADLs.  He is 3L oxygen  NCC 24/7. He will use 4L in shower and walking outside.  He is not able to go and do as he pleases due to his breathing.  He wants to be able to do more and not get so SOB.  They came to look at program prior to signing up and were pleased.  He did rehab before  at [facility] prior to transplant work up. They have no barriers to attending other than $15 copay. We talked about how it can be set up as payment plan with hospital as well.    Expected Outcomes Short: Attend rehab regularly Long: Improve stamina and breathing    Continue Psychosocial Services  Follow up required by staff          Psychosocial Re-Evaluation:  Psychosocial Re-Evaluation     Row Name 03/03/24 1258             Psychosocial Re-Evaluation   Current issues with None Identified       Comments John [name] is doing well in rehab. He stated that he does not have very many issues with his sleep. He does get up during the night to use the restroom but is able to get back to sleep. He also stated that he does not have much stress in his life. He has his everyday worrying about his breathing but other then that he isnt very stressed.       Expected Outcomes Short: continue to have outlets for stress   long : continue to exericse for ovr all stress relef       Interventions Encouraged to attend Pulmonary Rehabilitation for the  exercise       Continue Psychosocial Services  Follow up required by staff          Psychosocial Discharge (Final Psychosocial Re-Evaluation):  Psychosocial Re-Evaluation - 03/03/24 1258       Psychosocial Re-Evaluation   Current issues with None Identified    Comments John [name] is doing well in rehab. He stated that he does not have very many issues with his sleep. He does get up during the night to use the restroom but is able to get back to sleep. He also stated that he does not have much stress in his life. He has his everyday worrying about his breathing but other then that he isnt very stressed.    Expected Outcomes Short: continue to have outlets for stress   long : continue to exericse for ovr all stress relef    Interventions Encouraged to attend Pulmonary Rehabilitation for the exercise    Continue Psychosocial Services  Follow up required by staff           Education: Education Goals: Education classes will be provided on a weekly basis, covering required topics. Participant will state understanding/return demonstration of topics presented.  Learning Barriers/Preferences:  Learning Barriers/Preferences - 01/30/24 0924       Learning Barriers/Preferences   Learning Barriers Sight   glasses   Learning Preferences Skilled Demonstration          Education Topics: How Lungs Work and Diseases: - Discuss the anatomy of the lungs and diseases that can affect the lungs, such as COPD.   Exercise: -Discuss the importance of exercise, FITT principles of exercise, normal and abnormal responses to exercise, and how to exercise safely.   Environmental Irritants: -Discuss types of environmental irritants and how to limit exposure to environmental irritants.   Meds/Inhalers and oxygen : - Discuss respiratory medications, definition of an inhaler and oxygen , and the proper way to use an inhaler and oxygen .   Energy Saving Techniques: - Discuss methods to conserve energy and  decrease shortness of breath when performing activities of daily living.    Bronchial Hygiene / Breathing Techniques: - Discuss breathing mechanics, pursed-lip breathing technique,  proper posture, effective ways to clear airways, and other functional breathing  techniques   Cleaning Equipment: - Provides group verbal and written instruction about the health risks of elevated stress, cause of high stress, and healthy ways to reduce stress.   Nutrition I: Fats: - Discuss the types of cholesterol, what cholesterol does to the body, and how cholesterol levels can be controlled.   Nutrition II: Labels: -Discuss the different components of food labels and how to read food labels.   Respiratory Infections: - Discuss the signs and symptoms of respiratory infections, ways to prevent respiratory infections, and the importance of seeking medical treatment when having a respiratory infection.   Stress I: Signs and Symptoms: - Discuss the causes of stress, how stress may lead to anxiety and depression, and ways to limit stress. Flowsheet Row PULMONARY REHAB CHRONIC OBSTRUCTIVE PULMONARY DISEASE from 03/03/2024 in [facility]  Date 02/04/24  Educator [facility]  Instruction Review Code 1- Verbalizes Understanding    Stress II: Relaxation: -Discuss relaxation techniques to limit stress. Flowsheet Row PULMONARY REHAB CHRONIC OBSTRUCTIVE PULMONARY DISEASE from 03/03/2024 in [facility]  Date 02/04/24  Educator [facility]  Instruction Review Code 1- Verbalizes Understanding    Oxygen  for Home/Travel: - Discuss how to prepare for travel when on oxygen  and proper ways to transport and store oxygen  to ensure safety.   Knowledge Questionnaire Score:  Knowledge Questionnaire Score - 02/02/24 1638       Knowledge Questionnaire Score   Pre Score 15/18          Core Components/Risk Factors/Patient Goals at Admission:  Personal Goals and Risk Factors at Admission  - 02/02/24 1639       Core Components/Risk Factors/Patient Goals on Admission    Weight Management Yes;Weight Maintenance    Intervention Weight Management: Develop a combined nutrition and exercise program designed to reach desired caloric intake, while maintaining appropriate intake of nutrient and fiber, sodium and fats, and appropriate energy expenditure required for the weight goal.;Weight Management: Provide education and appropriate resources to help participant work on and attain dietary goals.    Admit Weight 149 lb 9.6 oz (67.9 kg)    Goal Weight: Short Term 150 lb (68 kg)    Goal Weight: Long Term 150 lb (68 kg)    Expected Outcomes Short Term: Continue to assess and modify interventions until short term weight is achieved;Long Term: Adherence to nutrition and physical activity/exercise program aimed toward attainment of established weight goal;Weight Maintenance: Understanding of the daily nutrition guidelines, which includes 25-35% calories from fat, 7% or less cal from saturated fats, less than 200mg  cholesterol, less than 1.5gm of sodium, & 5 or more servings of fruits and vegetables daily    Improve shortness of breath with ADL's Yes    Intervention Provide education, individualized exercise plan and daily activity instruction to help decrease symptoms of SOB with activities of daily living.    Expected Outcomes Short Term: Improve cardiorespiratory fitness to achieve a reduction of symptoms when performing ADLs;Long Term: Be able to perform more ADLs without symptoms or delay the onset of symptoms    Increase knowledge of respiratory medications and ability to use respiratory devices properly  Yes    Intervention Provide education and demonstration as needed of appropriate use of medications, inhalers, and oxygen  therapy.    Expected Outcomes Short Term: Achieves understanding of medications use. Understands that oxygen  is a medication prescribed by physician. Demonstrates appropriate  use of inhaler and oxygen  therapy.;Long Term: Maintain appropriate use of medications, inhalers, and oxygen  therapy.  Hypertension Yes    Intervention Provide education on lifestyle modifcations including regular physical activity/exercise, weight management, moderate sodium restriction and increased consumption of fresh fruit, vegetables, and low fat dairy, alcohol moderation, and smoking cessation.;Monitor prescription use compliance.    Expected Outcomes Short Term: Continued assessment and intervention until BP is < 140/61mm HG in hypertensive participants. < 130/64mm HG in hypertensive participants with diabetes, heart failure or chronic kidney disease.;Long Term: Maintenance of blood pressure at goal levels.    Lipids Yes    Intervention Provide education and support for participant on nutrition & aerobic/resistive exercise along with prescribed medications to achieve LDL 70mg , HDL >40mg .    Expected Outcomes Short Term: Participant states understanding of desired cholesterol values and is compliant with medications prescribed. Participant is following exercise prescription and nutrition guidelines.;Long Term: Cholesterol controlled with medications as prescribed, with individualized exercise RX and with personalized nutrition plan. Value goals: LDL < 70mg , HDL > 40 mg.          Core Components/Risk Factors/Patient Goals Review:   Goals and Risk Factor Review     Row Name 03/03/24 1324             Core Components/Risk Factors/Patient Goals Review   Personal Goals Review Weight Management/Obesity;Stress;Improve shortness of breath with ADL's       Review John [name] is doing well in rehab. He          Core Components/Risk Factors/Patient Goals at Discharge (Final Review):   Goals and Risk Factor Review - 03/03/24 1324       Core Components/Risk Factors/Patient Goals Review   Personal Goals Review Weight Management/Obesity;Stress;Improve shortness of breath with ADL's    Review John [name]  is doing well in rehab. He          ITP Comments:  ITP Comments     Row Name 01/30/24 1011 02/02/24 1630 02/04/24 0913 02/11/24 0828 03/10/24 0754   ITP Comments Completed virtual orientation today in person as he came to see program prior to signing up.  EP evaluation is scheduled for Monday May 12 at 1300 .  Documentation for diagnosis can be found in [facility] encounter 01/26/24. Patient attend orientation today.  Patient is attending Pulmonary Rehabilitation Program.  Documentation for diagnosis can be found in [facility] OV 01/26/24.  Reviewed medical chart, RPE/RPD, gym safety, and program guidelines.  Patient was fitted to equipment they will be using during rehab.  Patient is scheduled to start exercise on Wed 5/14 at 915.   Initial ITP created and sent for review and signature by Dr. Gwendalyn Lemma, Medical Director for Pulmonary Rehabilitation Program. First full day of exercise!  Patient was oriented to gym and equipment including functions, settings, policies, and procedures.  Patient's individual exercise prescription and treatment plan were reviewed.  All starting workloads were established based on the results of the 6 minute walk test done at initial orientation visit.  The plan for exercise progression was also introduced and progression will be customized based on patient's performance and goals. 30 day review completed. ITP sent to Dr.Jehanzeb Memon, Medical Director of  Pulmonary Rehab. Continue with ITP unless changes are made by physician.  New to program. 30 day review completed. ITP sent to Dr.Jehanzeb Memon, Medical Director of Pulmonary Rehab. Continue with ITP unless changes are made by physician.      Comments: 30 Day Review

## 2024-03-15 ENCOUNTER — Ambulatory Visit: Payer: Self-pay | Admitting: Pulmonary Disease

## 2024-03-15 ENCOUNTER — Encounter (HOSPITAL_COMMUNITY)
Admission: RE | Admit: 2024-03-15 | Discharge: 2024-03-15 | Disposition: A | Discharge status: Home / Self Care | Source: Ambulatory Visit | Attending: Pulmonary Disease

## 2024-03-15 DIAGNOSIS — J4489 Other specified chronic obstructive pulmonary disease: Secondary | ICD-10-CM

## 2024-03-15 DIAGNOSIS — J439 Emphysema, unspecified: Secondary | ICD-10-CM | POA: Diagnosis not present

## 2024-03-15 DIAGNOSIS — R911 Solitary pulmonary nodule: Secondary | ICD-10-CM

## 2024-03-15 NOTE — Progress Notes (Signed)
 Daily Session Note  Patient Details  Name: John [name] MRN: [id number] Date of Birth: 11-18-1955 Referring Provider:   Flowsheet Row PULMONARY REHAB COPD ORIENTATION from 02/02/2024 in [facility]  Referring Provider [name] MD    Encounter Date: 03/15/2024  Check In:  Session Check In - 03/15/24 0915       Check-In   Supervising physician immediately available to respond to emergencies See telemetry face sheet for immediately available MD    Location [facility]    Staff Present Powell Benders, BS, Exercise Physiologist;Areanna Gengler Jackquline, BSN, RN, Estrella Daring, BS, RRT, CPFT    Virtual Visit No    Medication changes reported     No    Fall or balance concerns reported    No    Tobacco Cessation No Change    Warm-up and Cool-down Performed on first and last piece of equipment    Resistance Training Performed Yes    PAD/SET Patient? No      Pain Assessment   Currently in Pain? No/denies          Capillary Blood Glucose: No results found for this or any previous visit (from the past 24 hours).    Social History   Tobacco Use  Smoking Status Former   Current packs/day: 0.00   Average packs/day: 2.0 packs/day for 51.6 years (103.1 ttl pk-yrs)   Types: Cigarettes   Start date: 37   Quit date: 04/18/2020   Years since quitting: 3.9  Smokeless Tobacco Never  Tobacco Comments   2 plus packs per day. He has a 100 + pack - year history of tobacco abuse currently. Former 4 ppd for 25 years.    Goals Met:  Proper associated with RPD/PD & O2 Sat Independence with exercise equipment Improved SOB with ADL's Using PLB without cueing & demonstrates good technique Exercise tolerated well No report of concerns or symptoms today Strength training completed today  Goals Unmet:  Not Applicable  Comments: Pt able to follow exercise prescription today without complaint.  Will continue to monitor for  progression.

## 2024-03-15 NOTE — Telephone Encounter (Signed)
 John [name] calling back to follow up on this message they received in mychart from dr Jude.  Pt aware he is needing PET scan and PFT's.. Pt also aware the nurse will put the orders in. She asked you call John [name]'s phone before making any appts for him. Thank you!  [phone]

## 2024-03-16 NOTE — Progress Notes (Signed)
 Orders placed front staff with schedule F/U after we have dates for PFT and PET

## 2024-03-16 NOTE — Progress Notes (Unsigned)
 Office Note    HPI: John [name] is a 68 y.o. (1956-08-28) male presenting status post EVAR for rupture AAA.  On exam today, John [name] was doing well, accompanied by his wife.  No major changes since seen last. He requires 3 to 4 L of oxygen  at baseline, and was riding his electric scooter.  Overall he continues well.  He denies abdominal pain.  He continues to have mild left groin pain to palpation, however has not appreciated any swelling or abnormal pulsation.  Denies bilateral lower extremity claudication. Normal bowel movements, normal urination   Past Medical History:  Diagnosis Date   Alcohol abuse 12/27/2010   Allergic rhinitis, cause unspecified 12/27/2010   Anal warts 04/11/2012   CAD, NATIVE VESSEL 10/04/2010   COPD (chronic obstructive pulmonary disease) (HCC)    a touch (09/28/2013)   History of blood transfusion 03/2012   related to nose bleed   HYPERLIPIDEMIA-MIXED 10/04/2010   HYPERTENSION, BENIGN 10/04/2010   Myocardial infarction (HCC) 09/16/2010   Pneumonia    when I was a kid   Stented coronary artery    Mid [facility]    TOBACCO ABUSE 10/04/2010    Past Surgical History:  Procedure Laterality Date   ABDOMINAL AORTIC ENDOVASCULAR STENT GRAFT N/A 07/28/2022   Procedure: ABDOMINAL AORTIC ENDOVASCULAR STENT GRAFT;  Surgeon: Lanis Fonda BRAVO, MD;  Location: [facility] OR;  Service: Vascular;  Laterality: N/A;   CARDIAC CATHETERIZATION  ~ 2012   CORONARY ANGIOPLASTY WITH STENT PLACEMENT  09/17/2010; 09/28/2014   1; 2   FINGER SURGERY Left    almost cut off tip of 2nd digit   LEFT HEART CATHETERIZATION WITH CORONARY ANGIOGRAM N/A 09/28/2014   Procedure: LEFT HEART CATHETERIZATION WITH CORONARY ANGIOGRAM;  Surgeon: Lonni JONETTA Cash, MD;  Location: [facility] CATH LAB;  Service: Cardiovascular;  Laterality: N/A;   NASAL ENDOSCOPY WITH EPISTAXIS CONTROL Bilateral 03/2012   ULTRASOUND GUIDANCE FOR VASCULAR ACCESS N/A 07/28/2022   Procedure: ULTRASOUND GUIDANCE FOR VASCULAR ACCESS;   Surgeon: Lanis Fonda BRAVO, MD;  Location: [facility] OR;  Service: Vascular;  Laterality: N/A;   VASECTOMY      Social History   Socioeconomic History   Marital status: Single    Spouse name: Not on file   Number of children: Not on file   Years of education: Not on file   Highest education level: Not on file  Occupational History   Occupation: Lobbyist maintenance work     Employer: [employer]    Comment: [employer]  Tobacco Use   Smoking status: Former    Current packs/day: 0.00    Average packs/day: 2.0 packs/day for 51.6 years (103.1 ttl pk-yrs)    Types: Cigarettes    Start date: 31    Quit date: 04/18/2020    Years since quitting: 3.9   Smokeless tobacco: Never   Tobacco comments:    2 plus packs per day. He has a 100 + pack - year history of tobacco abuse currently. Former 4 ppd for 25 years.  Vaping Use   Vaping status: Never Used  Substance and Sexual Activity   Alcohol use: Yes    Alcohol/week: 144.0 standard drinks of alcohol    Types: 144 Cans of beer per week    Comment:  history 12 pack of beer per night.  2021- drinks 3 beer/day   Drug use: No   Sexual activity: Yes    Birth control/protection: None  Other Topics Concern   Not on file  Social History Narrative  The patient lives in [place] with his girlfriend. 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   Social Drivers of Corporate investment banker Strain: Low Risk  (09/10/2023)   Overall Financial Resource Strain (CARDIA)    Difficulty of Paying Living Expenses: Not hard at all  Food Insecurity: No Food Insecurity (09/10/2023)   Hunger Vital Sign    Worried About Running Out of Food in the Last Year: Never true    Ran Out of Food in the Last Year: Never true  Transportation Needs: No Transportation Needs (09/10/2023)   PRAPARE - Administrator, Civil Service (Medical): No    Lack of Transportation (Non-Medical): No  Physical Activity: Inactive (09/10/2023)    Exercise Vital Sign    Days of Exercise per Week: 0 days    Minutes of Exercise per Session: 0 min  Stress: No Stress Concern Present (09/10/2023)   [employer] of Occupational Health - Occupational Stress Questionnaire    Feeling of Stress : Not at all  Social Connections: Moderately Integrated (09/10/2023)   Social Connection and Isolation Panel    Frequency of Communication with Friends and Family: More than three times a week    Frequency of Social Gatherings with Friends and Family: Three times a week    Attends Religious Services: Never    Active Member of Clubs or Organizations: Yes    Attends Banker Meetings: Never    Marital Status: Living with partner  Intimate Partner Violence: Not At Risk (09/10/2023)   Humiliation, Afraid, Rape, and Kick questionnaire    Fear of Current or Ex-Partner: No    Emotionally Abused: No    Physically Abused: No    Sexually Abused: No   Family History  Problem Relation Age of Onset   Brain cancer Mother    Heart disease Father     Current Outpatient Medications  Medication Sig Dispense Refill   albuterol  (PROAIR  HFA) 108 (90 Base) MCG/ACT inhaler Inhale 2 puffs into the lungs every 4 (four) hours as needed for wheezing or shortness of breath. 2 puffs every 4 hours as needed only  if your can't catch your breath 8 g 5   albuterol  (PROVENTIL ) (2.5 MG/3ML) 0.083% nebulizer solution Take 3 mLs (2.5 mg total) by nebulization every 4 (four) hours as needed for wheezing or shortness of breath. 360 mL 5   aspirin  EC 81 MG tablet Take 81 mg by mouth daily.     atorvastatin  (LIPITOR ) 40 MG tablet Take 1 tablet (40 mg total) by mouth daily. 90 tablet 3   Budeson-Glycopyrrol-Formoterol  (BREZTRI  AEROSPHERE) 160-9-4.8 MCG/ACT AERO Inhale 2 puffs into the lungs in the morning and at bedtime. 10.7 g 11   furosemide  (LASIX ) 40 MG tablet Take 1 tablet (40 mg total) by mouth daily. 90 tablet 2   guaiFENesin  (MUCINEX ) 600 MG 12 hr tablet Take  2 tablets (1,200 mg total) by mouth 2 (two) times daily as needed for cough or to loosen phlegm.     Multiple Vitamin (MULTIVITAMIN WITH MINERALS) TABS tablet Take 1 tablet by mouth daily.     nitroGLYCERIN  (NITROSTAT ) 0.4 MG SL tablet Place 1 tablet (0.4 mg total) under the tongue every 5 (five) minutes as needed for chest pain. 25 tablet 6   OXYGEN  Inhale 2-3 L into the lungs.     No current facility-administered medications for this visit.    Allergies  Allergen Reactions   Penicillins  Other (See Comments)    Passes out     REVIEW OF SYSTEMS:  [X]  denotes positive finding, [ ]  denotes negative finding Cardiac  Comments:  Chest pain or chest pressure:    Shortness of breath upon exertion:    Short of breath when lying flat:    Irregular heart rhythm:        Vascular    Pain in calf, thigh, or hip brought on by ambulation:    Pain in feet at night that wakes you up from your sleep:     Blood clot in your veins:    Leg swelling:         Pulmonary    Oxygen  at home:    Productive cough:     Wheezing:         Neurologic    Sudden weakness in arms or legs:     Sudden numbness in arms or legs:     Sudden onset of difficulty speaking or slurred speech:    Temporary loss of vision in one eye:     Problems with dizziness:         Gastrointestinal    Blood in stool:     Vomited blood:         Genitourinary    Burning when urinating:     Blood in urine:        Psychiatric    Major depression:         Hematologic    Bleeding problems:    Problems with blood clotting too easily:        Skin    Rashes or ulcers:        Constitutional    Fever or chills:      PHYSICAL EXAMINATION:  There were no vitals filed for this visit.   General:  WDWN in NAD; vital signs documented above Gait: Not observed HENT: WNL, normocephalic Pulmonary: O2 dependent, nonlabored Cardiac: regular HR Abdomen: soft, NT, no masses Skin: without rashes Vascular Exam/Pulses:  Right  Left  Radial 2+ (normal) 2+ (normal)  Ulnar    Femoral    Popliteal    DP 2+ (normal) 2+ (normal)  PT     Extremities: without ischemic changes, without Gangrene , without cellulitis; without open wounds;  Musculoskeletal: no muscle wasting or atrophy  Neurologic: A&O X 3;  No focal weakness or paresthesias are detected Psychiatric:  The pt has Normal affect.   Non-Invasive Vascular Imaging:   Endovascular Aortic Repair Study (EVAR)  Patient Name:  John [name]  Date of Exam:   02/28/2023 Medical Rec #: [id number]           Accession #:    [id number] Date of Birth: [birth year]          Patient Gender: M Patient Age:   54 years Exam Location:  [facility] Procedure:      VAS US  EVAR DUPLEX Referring Phys: FONDA John [name]   --------------------------------------------------------------------------- -----   Indications: Surgery date 07/28/22: EVAR.  Limitations: Air/bowel gas.    Comparison Study: Post op CT 08/19/22: 9.7 cm with no endoleak  Performing Technologist: [place] RVT    Examination Guidelines: A complete evaluation includes B-mode imaging, spectral Doppler, color Doppler, and power Doppler as needed of all accessible portions of each vessel. Bilateral testing is considered an integral part of a complete examination. Limited examinations for reoccurring indications may be performed as noted.    Endovascular Aortic Repair (EVAR): +----------+----------------+-------------------+-------------------+  Diameter AP (cm)Diameter Trans (cm)Velocities (cm/sec) +----------+----------------+-------------------+-------------------+ Aorta     6.26            6.84               64                  +----------+----------------+-------------------+-------------------+ Right Limb1.68            1.46               48                  +----------+----------------+-------------------+-------------------+ Left Limb 1.51             1.55               48                  +----------+----------------+-------------------+-------------------+      ASSESSMENT/PLAN: John [name] is a 68 y.o. male presenting status post endovascular aortic repair for ruptured AAA on 07/28/22.  Inpatient follow-up CT was deferred due to CKD.  Recent ultrasound shows significant decrease in aortic sac size. No endoleak.  I asked that he continue with a daily aspirin  and high intensity statin.  My plan is to see him in 12 months for follow-up duplex ultrasound of the aorta.  He was asked to call my office should any questions or concerns arise.   Fonda FORBES Rim, MD [facility] [phone] Total time of patient care including pre-visit research, consultation, and documentation greater than 20 minutes

## 2024-03-17 ENCOUNTER — Encounter (HOSPITAL_COMMUNITY)
Admission: RE | Admit: 2024-03-17 | Discharge: 2024-03-17 | Disposition: A | Discharge status: Home / Self Care | Source: Ambulatory Visit | Attending: Pulmonary Disease | Admitting: Pulmonary Disease

## 2024-03-17 DIAGNOSIS — J439 Emphysema, unspecified: Secondary | ICD-10-CM

## 2024-03-17 DIAGNOSIS — J4489 Other specified chronic obstructive pulmonary disease: Secondary | ICD-10-CM | POA: Diagnosis not present

## 2024-03-17 NOTE — Progress Notes (Signed)
 Daily Session Note  Patient Details  Name: John [name] MRN: [id number] Date of Birth: [birth year] Referring Provider:   Flowsheet Row PULMONARY REHAB COPD ORIENTATION from 02/02/2024 in [facility] CARDIAC REHABILITATION  Referring Provider [name] MD    Encounter Date: 03/17/2024  Check In:  Session Check In - 03/17/24 0915       Check-In   Supervising physician immediately available to respond to emergencies See telemetry face sheet for immediately available MD    Location [facility]    Staff Present Adrien Louder, RN, BSN;Heather Con, BS, Exercise Physiologist   Myrick Browner, [place]   Virtual Visit No    Medication changes reported     No    Fall or balance concerns reported    No    Warm-up and Cool-down Performed on first and last piece of equipment    Resistance Training Performed Yes    VAD Patient? No    PAD/SET Patient? No      Pain Assessment   Currently in Pain? No/denies    Multiple Pain Sites No          Capillary Blood Glucose: No results found for this or any previous visit (from the past 24 hours).    Social History   Tobacco Use  Smoking Status Former   Current packs/day: 0.00   Average packs/day: 2.0 packs/day for 51.6 years (103.1 ttl pk-yrs)   Types: Cigarettes   Start date: 27   Quit date: 04/18/2020   Years since quitting: 3.9  Smokeless Tobacco Never  Tobacco Comments   2 plus packs per day. He has a 100 + pack - year history of tobacco abuse currently. Former 4 ppd for 25 years.    Goals Met:  Proper associated with RPD/PD & O2 Sat Independence with exercise equipment Using PLB without cueing & demonstrates good technique Exercise tolerated well No report of concerns or symptoms today Strength training completed today  Goals Unmet:  Not Applicable  Comments: Pt able to follow exercise prescription today without complaint.  Will continue to monitor for progression.

## 2024-03-18 ENCOUNTER — Encounter: Payer: Self-pay | Admitting: Vascular Surgery

## 2024-03-18 ENCOUNTER — Ambulatory Visit (HOSPITAL_COMMUNITY)
Admission: RE | Admit: 2024-03-18 | Discharge: 2024-03-18 | Disposition: A | Discharge status: Home / Self Care | Source: Ambulatory Visit | Attending: Vascular Surgery | Admitting: Vascular Surgery

## 2024-03-18 ENCOUNTER — Ambulatory Visit: Attending: Vascular Surgery | Admitting: Vascular Surgery

## 2024-03-18 VITALS — BP 121/70 | HR 106 | Temp 98.0°F

## 2024-03-18 DIAGNOSIS — Z8679 Personal history of other diseases of the circulatory system: Secondary | ICD-10-CM

## 2024-03-18 DIAGNOSIS — Z9889 Other specified postprocedural states: Secondary | ICD-10-CM

## 2024-03-18 DIAGNOSIS — I713 Abdominal aortic aneurysm, ruptured, unspecified: Secondary | ICD-10-CM

## 2024-03-19 DIAGNOSIS — J449 Chronic obstructive pulmonary disease, unspecified: Secondary | ICD-10-CM | POA: Diagnosis not present

## 2024-03-19 DIAGNOSIS — J9610 Chronic respiratory failure, unspecified whether with hypoxia or hypercapnia: Secondary | ICD-10-CM | POA: Diagnosis not present

## 2024-03-22 ENCOUNTER — Encounter (HOSPITAL_COMMUNITY)
Admission: RE | Admit: 2024-03-22 | Discharge: 2024-03-22 | Disposition: A | Discharge status: Home / Self Care | Source: Ambulatory Visit | Attending: Pulmonary Disease | Admitting: Pulmonary Disease

## 2024-03-22 DIAGNOSIS — J4489 Other specified chronic obstructive pulmonary disease: Secondary | ICD-10-CM | POA: Diagnosis not present

## 2024-03-22 DIAGNOSIS — J439 Emphysema, unspecified: Secondary | ICD-10-CM

## 2024-03-22 NOTE — Progress Notes (Signed)
 Daily Session Note  Patient Details  Name: John [name] MRN: [id number] Date of Birth: 1956-02-15 Referring Provider:   Flowsheet Row PULMONARY REHAB COPD ORIENTATION from 02/02/2024 in [facility] CARDIAC REHABILITATION  Referring Provider [name] MD    Encounter Date: 03/22/2024  Check In:  Session Check In - 03/22/24 0929       Check-In   Supervising physician immediately available to respond to emergencies See telemetry face sheet for immediately available MD    Location [facility]    Staff Present Danney Daring, BS, RRT, CPFT;Heather Con H[place], Exercise Physiologist;Debra Vicci, RN, BSN;Arriel Victor, MA, RCEP, CCRP, CCET    Virtual Visit No    Medication changes reported     No    Fall or balance concerns reported    No    Tobacco Cessation No Change    Warm-up and Cool-down Performed on first and last piece of equipment    Resistance Training Performed Yes    VAD Patient? No    PAD/SET Patient? No      Pain Assessment   Currently in Pain? No/denies          Capillary Blood Glucose: No results found for this or any previous visit (from the past 24 hours).    Social History   Tobacco Use  Smoking Status Former   Current packs/day: 0.00   Average packs/day: 2.0 packs/day for 51.6 years (103.1 ttl pk-yrs)   Types: Cigarettes   Start date: 81   Quit date: 04/18/2020   Years since quitting: 3.9  Smokeless Tobacco Never  Tobacco Comments   2 plus packs per day. He has a 100 + pack - year history of tobacco abuse currently. Former 4 ppd for 25 years.    Goals Met:  Proper associated with RPD/PD & O2 Sat Independence with exercise equipment Using PLB without cueing & demonstrates good technique Exercise tolerated well No report of concerns or symptoms today Strength training completed today  Goals Unmet:  Not Applicable  Comments: Pt able to follow exercise prescription today without complaint.  Will continue to monitor  for progression.

## 2024-03-23 ENCOUNTER — Other Ambulatory Visit (HOSPITAL_BASED_OUTPATIENT_CLINIC_OR_DEPARTMENT_OTHER): Payer: Self-pay | Admitting: Pulmonary Disease

## 2024-03-23 ENCOUNTER — Other Ambulatory Visit (HOSPITAL_BASED_OUTPATIENT_CLINIC_OR_DEPARTMENT_OTHER): Payer: Self-pay

## 2024-03-23 MED ORDER — ALBUTEROL SULFATE (2.5 MG/3ML) 0.083% IN NEBU: 2.5000 mg | INHALATION_SOLUTION | RESPIRATORY_TRACT | 5 refills | Status: AC | PRN

## 2024-03-23 NOTE — Telephone Encounter (Unsigned)
 Copied from CRM [id number]. Topic: Clinical - Medication Refill >> Mar 23, 2024  2:18 PM Isabell A wrote: Medication: albuterol  (PROVENTIL ) (2.5 MG/3ML) 0.083% nebulizer solution   Has the patient contacted their pharmacy? No (Agent: If no, request that the patient contact the pharmacy for the refill. If patient does not wish to contact the pharmacy document the reason why and proceed with request.) (Agent: If yes, when and what did the pharmacy advise?)  This is the patient's preferred pharmacy:  [facility] [address], [place] - [address] [place] [address] Phone: [phone] Fax: [phone]  Is this the correct pharmacy for this prescription? Yes If no, delete pharmacy and type the correct one.   Has the prescription been filled recently? Yes  Is the patient out of the medication? Yes  Has the patient been seen for an appointment in the last year OR does the patient have an upcoming appointment? No  Can we respond through MyChart? No  Agent: Please be advised that Rx refills may take up to 3 business days. We ask that you follow-up with your pharmacy.

## 2024-03-24 ENCOUNTER — Ambulatory Visit: Payer: Self-pay | Admitting: Internal Medicine

## 2024-03-24 ENCOUNTER — Encounter (HOSPITAL_COMMUNITY)

## 2024-03-24 NOTE — Telephone Encounter (Signed)
 FYI Only or Action Required?: Action required by provider: medication refill request and update on patient condition.  Patient is followed in Pulmonology for COPD, last seen on 01/26/2024 by Jude Harden GAILS, MD. Called Nurse Triage reporting Chest Pain. Symptoms began several days ago. Interventions attempted: Nothing. Symptoms are: unchanged.  Triage Disposition: See HCP Within 4 Hours (Or PCP Triage)  Patient/caregiver understands and will follow disposition?: No, wishes to speak with PCP                            Copied from CRM #[id number]. Topic: Clinical - Red Word Triage >> Mar 24, 2024  9:00 AM John [name] wrote: Red Word that prompted transfer to Nurse Triage: Patient has chest tightness and requesting a zpack and prednisone . Reason for Disposition  [1] Longstanding difficulty breathing (e.g., CHF, COPD, emphysema) AND [2] WORSE than normal  Answer Assessment - Initial Assessment Questions Chest tightness with breathing x2 days. Patient states this has happened before as he has COPD and his previous pulm doctor would prescribe him a z-pak and prednisone . Pt does not want to schedule an appt and wants these medications sent to:  [id number] - [place], [place] - [address] AT [facility]     LOCATION: Where does it hurt?       Lower area RADIATION: Does the pain go anywhere else? (e.g., into neck, jaw, arms, back)     No ONSET: When did the chest pain begin? (Minutes, hours or days)      Two days PATTERN: Does the pain come and go, or has it been constant since it started?  Does it get worse with exertion?      Mostly constant DURATION: How long does it last (e.g., seconds, minutes, hours)     N/A SEVERITY: How bad is the pain?  (e.g., Scale 1-10; mild, moderate, or severe)    - MILD (1-3): doesn't interfere with normal activities     - MODERATE (4-7): interferes with normal activities or awakens from sleep    - SEVERE  (8-10): excruciating pain, unable to do any normal activities       7-8/10 pain CAUSE: What do you think is causing the chest pain?     I have COPD. Pt wants zpak and prednisone  OTHER SYMPTOMS: Do you have any other symptoms? (e.g., dizziness, nausea, vomiting, sweating, fever, difficulty breathing, cough)       No  Protocols used: Chest Pain-A-AH, Breathing Difficulty-A-AH

## 2024-03-24 NOTE — Telephone Encounter (Signed)
 Please advise Patient has chest tightness and requesting a zpack and prednisone .

## 2024-03-25 ENCOUNTER — Encounter (HOSPITAL_COMMUNITY)
Admission: RE | Admit: 2024-03-25 | Discharge: 2024-03-25 | Disposition: A | Discharge status: Home / Self Care | Source: Ambulatory Visit | Attending: Pulmonary Disease | Admitting: Pulmonary Disease

## 2024-03-25 DIAGNOSIS — R911 Solitary pulmonary nodule: Secondary | ICD-10-CM | POA: Insufficient documentation

## 2024-03-25 DIAGNOSIS — R918 Other nonspecific abnormal finding of lung field: Secondary | ICD-10-CM | POA: Diagnosis not present

## 2024-03-25 MED ORDER — FLUDEOXYGLUCOSE F - 18 (FDG) INJECTION
7.3800 | Freq: Once | INTRAVENOUS | Status: AC | PRN
  Administered 2024-03-25: 7.38 via INTRAVENOUS

## 2024-03-29 ENCOUNTER — Encounter (HOSPITAL_COMMUNITY)
Admission: RE | Admit: 2024-03-29 | Discharge: 2024-03-29 | Disposition: A | Discharge status: Home / Self Care | Source: Ambulatory Visit | Attending: Pulmonary Disease | Admitting: Pulmonary Disease

## 2024-03-29 ENCOUNTER — Ambulatory Visit: Payer: Self-pay | Admitting: Pulmonary Disease

## 2024-03-29 DIAGNOSIS — J4489 Other specified chronic obstructive pulmonary disease: Secondary | ICD-10-CM | POA: Insufficient documentation

## 2024-03-29 DIAGNOSIS — J439 Emphysema, unspecified: Secondary | ICD-10-CM | POA: Insufficient documentation

## 2024-03-29 DIAGNOSIS — K6389 Other specified diseases of intestine: Secondary | ICD-10-CM

## 2024-03-29 NOTE — Progress Notes (Signed)
 Daily Session Note  Patient Details  Name: John [name] MRN: [id number] Date of Birth: 28-Apr-1956 Referring Provider:   Flowsheet Row PULMONARY REHAB COPD ORIENTATION from 02/02/2024 in [facility] CARDIAC REHABILITATION  Referring Provider John Donning MD    Encounter Date: 03/29/2024  Check In:  Session Check In - 03/29/24 0915       Check-In   Supervising physician immediately available to respond to emergencies See telemetry face sheet for immediately available MD    Location [facility]    Staff Present John [name], BSN, RN, John Gelineau, MA, RCEP, CCRP, CCET    Virtual Visit No    Medication changes reported     No    Fall or balance concerns reported    No    Tobacco Cessation No Change    Warm-up and Cool-down Performed on first and last piece of equipment    Resistance Training Performed Yes    VAD Patient? No    PAD/SET Patient? No      Pain Assessment   Currently in Pain? No/denies          Capillary Blood Glucose: No results found for this or any previous visit (from the past 24 hours).    Social History   Tobacco Use  Smoking Status Former   Current packs/day: 0.00   Average packs/day: 2.0 packs/day for 51.6 years (103.1 ttl pk-yrs)   Types: Cigarettes   Start date: 57   Quit date: 04/18/2020   Years since quitting: 3.9  Smokeless Tobacco Never  Tobacco Comments   2 plus packs per day. He has a 100 + pack - year history of tobacco abuse currently. Former 4 ppd for 25 years.    Goals Met:  Proper associated with RPD/PD & O2 Sat Independence with exercise equipment Improved SOB with ADL's Using PLB without cueing & demonstrates good technique Exercise tolerated well No report of concerns or symptoms today Strength training completed today  Goals Unmet:  Not Applicable  Comments:  Pt able to follow exercise prescription today without complaint.  Will continue to monitor for progression.

## 2024-03-31 ENCOUNTER — Encounter: Payer: Self-pay | Admitting: Internal Medicine

## 2024-03-31 ENCOUNTER — Encounter (HOSPITAL_COMMUNITY)
Admission: RE | Admit: 2024-03-31 | Discharge: 2024-03-31 | Disposition: A | Discharge status: Home / Self Care | Source: Ambulatory Visit | Attending: Pulmonary Disease | Admitting: Pulmonary Disease

## 2024-03-31 DIAGNOSIS — J439 Emphysema, unspecified: Secondary | ICD-10-CM | POA: Diagnosis not present

## 2024-03-31 DIAGNOSIS — J4489 Other specified chronic obstructive pulmonary disease: Secondary | ICD-10-CM | POA: Diagnosis not present

## 2024-03-31 NOTE — Progress Notes (Signed)
 Daily Session Note  Patient Details  Name: John [name] MRN: [id number] Date of Birth: 1956/01/03 Referring Provider:   Flowsheet Row PULMONARY REHAB COPD ORIENTATION from 02/02/2024 in [facility] CARDIAC REHABILITATION  Referring Provider [name] MD    Encounter Date: 03/31/2024  Check In:  Session Check In - 03/31/24 0952       Check-In   Supervising physician immediately available to respond to emergencies See telemetry face sheet for immediately available MD    Location [facility]    Staff Present Laymon Rattler, BSN, RN, Rosalba Gelineau, MA, RCEP, CCRP, CCET    Virtual Visit No    Medication changes reported     No    Fall or balance concerns reported    No    Tobacco Cessation No Change    Warm-up and Cool-down Performed on first and last piece of equipment    Resistance Training Performed Yes    VAD Patient? No    PAD/SET Patient? No      Pain Assessment   Currently in Pain? No/denies          Capillary Blood Glucose: No results found for this or any previous visit (from the past 24 hours).    Social History   Tobacco Use  Smoking Status Former   Current packs/day: 0.00   Average packs/day: 2.0 packs/day for 51.6 years (103.1 ttl pk-yrs)   Types: Cigarettes   Start date: 89   Quit date: 04/18/2020   Years since quitting: 3.9  Smokeless Tobacco Never  Tobacco Comments   2 plus packs per day. He has a 100 + pack - year history of tobacco abuse currently. Former 4 ppd for 25 years.    Goals Met:  Proper associated with RPD/PD & O2 Sat Independence with exercise equipment Improved SOB with ADL's Using PLB without cueing & demonstrates good technique Exercise tolerated well No report of concerns or symptoms today Strength training completed today  Goals Unmet:  Not Applicable  Comments: Pt able to follow exercise prescription today without complaint.  Will continue to monitor for progression.

## 2024-03-31 NOTE — Telephone Encounter (Signed)
 Referral placed.

## 2024-04-05 ENCOUNTER — Telehealth (HOSPITAL_COMMUNITY): Payer: Self-pay | Admitting: *Deleted

## 2024-04-05 ENCOUNTER — Encounter (HOSPITAL_COMMUNITY): Payer: Self-pay | Admitting: *Deleted

## 2024-04-05 ENCOUNTER — Encounter (HOSPITAL_COMMUNITY)

## 2024-04-05 DIAGNOSIS — J439 Emphysema, unspecified: Secondary | ICD-10-CM

## 2024-04-05 DIAGNOSIS — J4489 Other specified chronic obstructive pulmonary disease: Secondary | ICD-10-CM

## 2024-04-05 NOTE — Telephone Encounter (Signed)
 Called to check on patient when he did not show for rehab today.  John [name] was having a bad breathing day and did not feel up to coming to rehab today.  He hopes to return on Wednesday and was encouraged to use his nebulizer an extra time today.

## 2024-04-07 ENCOUNTER — Encounter (HOSPITAL_COMMUNITY)
Admission: RE | Admit: 2024-04-07 | Discharge: 2024-04-07 | Disposition: A | Discharge status: Home / Self Care | Source: Ambulatory Visit | Attending: Pulmonary Disease

## 2024-04-07 ENCOUNTER — Encounter (HOSPITAL_COMMUNITY): Payer: Self-pay | Admitting: *Deleted

## 2024-04-07 DIAGNOSIS — J439 Emphysema, unspecified: Secondary | ICD-10-CM

## 2024-04-07 DIAGNOSIS — J4489 Other specified chronic obstructive pulmonary disease: Secondary | ICD-10-CM

## 2024-04-07 NOTE — Progress Notes (Signed)
 Pulmonary Individual Treatment Plan  Patient Details  Name: John [name] MRN: [id number] Date of Birth: 09/19/1956 Referring Provider:   Flowsheet Row PULMONARY REHAB COPD ORIENTATION from 02/02/2024 in [facility] CARDIAC REHABILITATION  Referring Provider [name] MD    Initial Encounter Date:  Flowsheet Row PULMONARY REHAB COPD ORIENTATION from 02/02/2024 in [facility]IAC REHABILITATION  Date 02/02/24    Visit Diagnosis: Pulmonary emphysema, unspecified emphysema type (HCC)  Chronic bronchitis with COPD (chronic obstructive pulmonary disease) (HCC)  Patient's Home Medications on Admission:   Current Outpatient Medications:    albuterol  (PROAIR  HFA) 108 (90 Base) MCG/ACT inhaler, Inhale 2 puffs into the lungs every 4 (four) hours as needed for wheezing or shortness of breath. 2 puffs every 4 hours as needed only  if your can't catch your breath, Disp: 8 g, Rfl: 5   albuterol  (PROVENTIL ) (2.5 MG/3ML) 0.083% nebulizer solution, Take 3 mLs (2.5 mg total) by nebulization every 4 (four) hours as needed for wheezing or shortness of breath., Disp: 360 mL, Rfl: 5   aspirin  EC 81 MG tablet, Take 81 mg by mouth daily., Disp: , Rfl:    atorvastatin  (LIPITOR ) 40 MG tablet, Take 1 tablet (40 mg total) by mouth daily., Disp: 90 tablet, Rfl: 3   Budeson-Glycopyrrol-Formoterol  (BREZTRI  AEROSPHERE) 160-9-4.8 MCG/ACT AERO, Inhale 2 puffs into the lungs in the morning and at bedtime., Disp: 10.7 g, Rfl: 11   furosemide  (LASIX ) 40 MG tablet, Take 1 tablet (40 mg total) by mouth daily., Disp: 90 tablet, Rfl: 2   guaiFENesin  (MUCINEX ) 600 MG 12 hr tablet, Take 2 tablets (1,200 mg total) by mouth 2 (two) times daily as needed for cough or to loosen phlegm., Disp: , Rfl:    Multiple Vitamin (MULTIVITAMIN WITH MINERALS) TABS tablet, Take 1 tablet by mouth daily., Disp: , Rfl:    nitroGLYCERIN  (NITROSTAT ) 0.4 MG SL tablet, Place 1 tablet (0.4 mg total) under the tongue every 5 (five) minutes as  needed for chest pain., Disp: 25 tablet, Rfl: 6   OXYGEN , Inhale 2-3 L into the lungs., Disp: , Rfl:   Past Medical History: Past Medical History:  Diagnosis Date   Alcohol abuse 12/27/2010   Allergic rhinitis, cause unspecified 12/27/2010   Anal warts 04/11/2012   CAD, NATIVE VESSEL 10/04/2010   COPD (chronic obstructive pulmonary disease) (HCC)    a touch (09/28/2013)   History of blood transfusion 03/2012   related to nose bleed   HYPERLIPIDEMIA-MIXED 10/04/2010   HYPERTENSION, BENIGN 10/04/2010   Myocardial infarction (HCC) 09/16/2010   Pneumonia    when I was a kid   Stented coronary artery    Mid LAD    TOBACCO ABUSE 10/04/2010    Tobacco Use: Social History   Tobacco Use  Smoking Status Former   Current packs/day: 0.00   Average packs/day: 2.0 packs/day for 51.6 years (103.1 ttl pk-yrs)   Types: Cigarettes   Start date: 53   Quit date: 04/18/2020   Years since quitting: 3.9  Smokeless Tobacco Never  Tobacco Comments   2 plus packs per day. He has a 100 + pack - year history of tobacco abuse currently. Former 4 ppd for 25 years.    Labs: Review Flowsheet  More data exists      Latest Ref Rng & Units 02/12/2021 03/22/2022 07/28/2022 10/14/2022 10/21/2023  Labs for ITP Cardiac and Pulmonary Rehab  Cholestrol 100 - 199 mg/dL - 833  - 840  821   LDL (calc) 0 - 99  mg/dL - 76  - 72  81   HDL-C >39 mg/dL - 77  - 76  86   Trlycerides 0 - 149 mg/dL - 70  - 55  57   PH, Arterial 7.35 - 7.45 7.283  - 7.265  - -  PCO2 arterial 32 - 48 mmHg 80.9  - 80.0  - -  Bicarbonate 20.0 - 28.0 mmol/L 31.4  - 36.7  - -  TCO2 22 - 32 mmol/L - - 39  - -  O2 Saturation % 97.2  - 97  - -    Capillary Blood Glucose: Lab Results  Component Value Date   GLUCAP 185 (H) 04/25/2020   GLUCAP 80 04/25/2020   GLUCAP 80 04/25/2020   GLUCAP 131 (H) 04/24/2020   GLUCAP 176 (H) 04/24/2020     Pulmonary Assessment Scores:  Pulmonary Assessment Scores     Row Name 02/02/24 1640         ADL  UCSD   ADL Phase Entry     SOB Score total 60     Rest 0     Walk 3     Stairs 4     Bath 4     Dress 2     Shop 3       CAT Score   CAT Score 17       mMRC Score   mMRC Score 4       UCSD: Self-administered rating of dyspnea associated with activities of daily living (ADLs) 6-point scale (0 = not at all to 5 = maximal or unable to do because of breathlessness)  Scoring Scores range from 0 to 120.  Minimally important difference is 5 units  CAT: CAT can identify the health impairment of COPD patients and is better correlated with disease progression.  CAT has a scoring range of zero to 40. The CAT score is classified into four groups of low (less than 10), medium (10 - 20), high (21-30) and very high (31-40) based on the impact level of disease on health status. A CAT score over 10 suggests significant symptoms.  A worsening CAT score could be explained by an exacerbation, poor medication adherence, poor inhaler technique, or progression of COPD or comorbid conditions.  CAT MCID is 2 points  mMRC: mMRC (Modified Medical Research Council) Dyspnea Scale is used to assess the degree of baseline functional disability in patients of respiratory disease due to dyspnea. No minimal important difference is established. A decrease in score of 1 point or greater is considered a positive change.   Pulmonary Function Assessment:  Pulmonary Function Assessment - 02/02/24 1643       Initial Spirometry Results   FVC% 34 %    FEV1% 58 %    FEV1/FVC Ratio 35    Comments test 12/09/19      Post Bronchodilator Spirometry Results   FVC% 33 %    FEV1% 59 %    FEV1/FVC Ratio 37    Comments test 12/09/19      Breath   Shortness of Breath Yes;Fear of Shortness of Breath;Limiting activity;Panic with Shortness of Breath          Exercise Target Goals: Exercise Program Goal: Individual exercise prescription set using results from initial 6 min walk test and THRR while considering   patient's activity barriers and safety.   Exercise Prescription Goal: Initial exercise prescription builds to 30-45 minutes a day of aerobic activity, 2-3 days per week.  Home exercise guidelines  will be given to patient during program as part of exercise prescription that the participant will acknowledge.  Activity Barriers & Risk Stratification:  Activity Barriers & Cardiac Risk Stratification - 01/30/24 0924       Activity Barriers & Cardiac Risk Stratification   Activity Barriers Deconditioning;Muscular Weakness;Shortness of Breath          6 Minute Walk:  6 Minute Walk     Row Name 02/02/24 1630         6 Minute Walk   Phase Initial     Distance 300 feet     Walk Time 3.33 minutes     # of Rest Breaks 5  21 sec, 36 sec, 20 sec, 53 sec, 30 sec (all standing rest)     MPH 1.02     METS 2.08     RPE 15     Perceived Dyspnea  3     VO2 Peak 7.3     Symptoms Yes (comment)     Comments SOB, chest tightness 6/10 hard to breathe     Resting HR 98 bpm     Resting BP 126/74     Resting Oxygen  Saturation  98 %     Exercise Oxygen  Saturation  during 6 min walk 93 %     Max Ex. HR 125 bpm     Max Ex. BP 142/84     2 Minute Post BP 132/64       Interval HR   1 Minute HR 125     2 Minute HR 117     3 Minute HR 109     4 Minute HR 114     5 Minute HR 110     6 Minute HR 112     2 Minute Post HR 105     Interval Heart Rate? Yes       Interval Oxygen    Interval Oxygen ? Yes     Baseline Oxygen  Saturation % 98 %  3L NCC     1 Minute Oxygen  Saturation % 95 %     1 Minute Liters of Oxygen  4 L  NCC     2 Minute Oxygen  Saturation % 97 %     2 Minute Liters of Oxygen  4 L     3 Minute Oxygen  Saturation % 95 %     3 Minute Liters of Oxygen  4 L     4 Minute Oxygen  Saturation % 97 %     4 Minute Liters of Oxygen  4 L     5 Minute Oxygen  Saturation % 98 %     5 Minute Liters of Oxygen  4 L     6 Minute Oxygen  Saturation % 93 %     6 Minute Liters of Oxygen  4 L     2 Minute  Post Oxygen  Saturation % 97 %     2 Minute Post Liters of Oxygen  4 L        Oxygen  Initial Assessment:  Oxygen  Initial Assessment - 01/30/24 0921       Home Oxygen    Home Oxygen  Device Home Concentrator;E-Tanks;Portable Concentrator    Sleep Oxygen  Prescription Continuous   ventilator machine for 5 hrs a day   Liters per minute 3    Home Exercise Oxygen  Prescription Continuous    Liters per minute 3   4 liters for shower and walking outside (up hill)   Home Resting Oxygen  Prescription Continuous    Liters per minute 3  Compliance with Home Oxygen  Use Yes   usually compliant     Initial 6 min Walk   Oxygen  Used E-Tanks;Continuous    Liters per minute 4      Program Oxygen  Prescription   Program Oxygen  Prescription E-Tanks;Continuous    Liters per minute 4      Intervention   Short Term Goals To learn and exhibit compliance with exercise, home and travel O2 prescription;To learn and understand importance of monitoring SPO2 with pulse oximeter and demonstrate accurate use of the pulse oximeter.;To learn and understand importance of maintaining oxygen  saturations>88%;To learn and demonstrate proper pursed lip breathing techniques or other breathing techniques. ;To learn and demonstrate proper use of respiratory medications    Long  Term Goals Exhibits compliance with exercise, home  and travel O2 prescription;Maintenance of O2 saturations>88%;Compliance with respiratory medication;Verbalizes importance of monitoring SPO2 with pulse oximeter and return demonstration;Exhibits proper breathing techniques, such as pursed lip breathing or other method taught during program session;Demonstrates proper use of MDI's          Oxygen  Re-Evaluation:  Oxygen  Re-Evaluation     Row Name 03/29/24 0936             Program Oxygen  Prescription   Program Oxygen  Prescription E-Tanks;Continuous       Liters per minute 3         Home Oxygen    Home Oxygen  Device Home  Concentrator;E-Tanks;Portable Concentrator       Liters per minute 3       Home Exercise Oxygen  Prescription Continuous       Liters per minute 3       Home Resting Oxygen  Prescription Continuous       Liters per minute 3       Compliance with Home Oxygen  Use Yes         Goals/Expected Outcomes   Short Term Goals To learn and exhibit compliance with exercise, home and travel O2 prescription;To learn and understand importance of monitoring SPO2 with pulse oximeter and demonstrate accurate use of the pulse oximeter.;To learn and understand importance of maintaining oxygen  saturations>88%;To learn and demonstrate proper pursed lip breathing techniques or other breathing techniques. ;To learn and demonstrate proper use of respiratory medications       Long  Term Goals Exhibits compliance with exercise, home  and travel O2 prescription;Maintenance of O2 saturations>88%;Compliance with respiratory medication;Verbalizes importance of monitoring SPO2 with pulse oximeter and return demonstration;Exhibits proper breathing techniques, such as pursed lip breathing or other method taught during program session;Demonstrates proper use of MDI's       Comments Rick's breathing has been about the same since last goal check-in. He states with the heat it has been much more difficult and he doesn't go outside when it's hot. He is compliant with his oxygen  at 3L all the time. He takes all his respiratory medications as prescribed. He states when he gets SOB a lot of times he will just have to sit still and wait for his HR to go down. Encouraged to use the PLB to help also.       Goals/Expected Outcomes Short: Continue to attend rehab. Long: Use PLB to help with SOB.          Oxygen  Discharge (Final Oxygen  Re-Evaluation):  Oxygen  Re-Evaluation - 03/29/24 0936       Program Oxygen  Prescription   Program Oxygen  Prescription E-Tanks;Continuous    Liters per minute 3      Home Oxygen    Home Oxygen   Device Home  Concentrator;E-Tanks;Portable Concentrator    Liters per minute 3    Home Exercise Oxygen  Prescription Continuous    Liters per minute 3    Home Resting Oxygen  Prescription Continuous    Liters per minute 3    Compliance with Home Oxygen  Use Yes      Goals/Expected Outcomes   Short Term Goals To learn and exhibit compliance with exercise, home and travel O2 prescription;To learn and understand importance of monitoring SPO2 with pulse oximeter and demonstrate accurate use of the pulse oximeter.;To learn and understand importance of maintaining oxygen  saturations>88%;To learn and demonstrate proper pursed lip breathing techniques or other breathing techniques. ;To learn and demonstrate proper use of respiratory medications    Long  Term Goals Exhibits compliance with exercise, home  and travel O2 prescription;Maintenance of O2 saturations>88%;Compliance with respiratory medication;Verbalizes importance of monitoring SPO2 with pulse oximeter and return demonstration;Exhibits proper breathing techniques, such as pursed lip breathing or other method taught during program session;Demonstrates proper use of MDI's    Comments Rick's breathing has been about the same since last goal check-in. He states with the heat it has been much more difficult and he doesn't go outside when it's hot. He is compliant with his oxygen  at 3L all the time. He takes all his respiratory medications as prescribed. He states when he gets SOB a lot of times he will just have to sit still and wait for his HR to go down. Encouraged to use the PLB to help also.    Goals/Expected Outcomes Short: Continue to attend rehab. Long: Use PLB to help with SOB.          Initial Exercise Prescription:  Initial Exercise Prescription - 02/02/24 1600       Date of Initial Exercise RX and Referring Provider   Date 02/02/24    Referring Provider [name] MD      Oxygen    Oxygen  Continuous    Liters 4    Maintain Oxygen  Saturation 88%  or higher      NuStep   Level 1    SPM 60    Minutes 30    METs 1.2      Track   Laps 2    Minutes 5    METs 1      Prescription Details   Frequency (times per week) 2    Duration Progress to 30 minutes of continuous aerobic without signs/symptoms of physical distress      Intensity   THRR 40-80% of Max Heartrate 120-142    Ratings of Perceived Exertion 11-13    Perceived Dyspnea 0-4      Progression   Progression Continue to progress workloads to maintain intensity without signs/symptoms of physical distress.      Resistance Training   Training Prescription Yes    Weight 3 lb    Reps 10-15          Perform Capillary Blood Glucose checks as needed.  Exercise Prescription Changes:   Exercise Prescription Changes     Row Name 02/02/24 1600 02/04/24 1300 02/18/24 1300 03/01/24 1500 03/29/24 1500     Response to Exercise   Blood Pressure (Admit) 126/74 128/58 110/60 128/74 126/64   Blood Pressure (Exercise) 142/84 134/70 120/60 130/60 --   Blood Pressure (Exit) 132/72 120/62 112/66 134/60 108/64   Heart Rate (Admit) 98 bpm 106 bpm 110 bpm 120 bpm 121 bpm   Heart Rate (Exercise) 125 bpm 106 bpm 116 bpm 124 bpm  109 bpm   Heart Rate (Exit) 104 bpm 100 bpm 116 bpm 120 bpm 111 bpm   Oxygen  Saturation (Admit) 98 % 94 % 91 % 90 % 91 %   Oxygen  Saturation (Exercise) 93 % 100 % 96 % 91 % 93 %   Oxygen  Saturation (Exit) 97 % 98 % 94 % 93 % 95 %   Rating of Perceived Exertion (Exercise) 15 15 14 15 13    Perceived Dyspnea (Exercise) 3 2 3 3 3    Symptoms standing rest breaks, SOB, chest tightness 6/10 (normal) -- -- -- --   Comments walk test results -- -- -- --   Duration -- Continue with 30 min of aerobic exercise without signs/symptoms of physical distress. Continue with 30 min of aerobic exercise without signs/symptoms of physical distress. Continue with 30 min of aerobic exercise without signs/symptoms of physical distress. Continue with 30 min of aerobic exercise without  signs/symptoms of physical distress.   Intensity -- THRR unchanged THRR unchanged THRR unchanged THRR unchanged     Progression   Progression -- Continue to progress workloads to maintain intensity without signs/symptoms of physical distress. Continue to progress workloads to maintain intensity without signs/symptoms of physical distress. Continue to progress workloads to maintain intensity without signs/symptoms of physical distress. Continue to progress workloads to maintain intensity without signs/symptoms of physical distress.     Resistance Training   Training Prescription -- Yes Yes Yes Yes   Weight -- 3 3 3 3    Reps -- 10-15 10-15 10-15 10-15     Oxygen    Oxygen  -- Continuous Continuous Continuous Continuous   Liters -- 4 4 3 4      NuStep   Level -- 1 1 -- 2   SPM -- 36 45 -- 43   Minutes -- 30 30 -- 30   METs -- 1.3 1.5 -- 1.5     Oxygen    Maintain Oxygen  Saturation -- 88% or higher 88% or higher -- 88% or higher      Exercise Comments:   Exercise Goals and Review:   Exercise Goals     Row Name 02/02/24 1636             Exercise Goals   Increase Physical Activity Yes       Intervention Provide advice, education, support and counseling about physical activity/exercise needs.;Develop an individualized exercise prescription for aerobic and resistive training based on initial evaluation findings, risk stratification, comorbidities and participant's personal goals.       Expected Outcomes Short Term: Attend rehab on a regular basis to increase amount of physical activity.;Long Term: Add in home exercise to make exercise part of routine and to increase amount of physical activity.;Long Term: Exercising regularly at least 3-5 days a week.       Increase Strength and Stamina Yes       Intervention Provide advice, education, support and counseling about physical activity/exercise needs.;Develop an individualized exercise prescription for aerobic and resistive training based  on initial evaluation findings, risk stratification, comorbidities and participant's personal goals.       Expected Outcomes Short Term: Increase workloads from initial exercise prescription for resistance, speed, and METs.;Short Term: Perform resistance training exercises routinely during rehab and add in resistance training at home;Long Term: Improve cardiorespiratory fitness, muscular endurance and strength as measured by increased METs and functional capacity ( )       Able to understand and use rate of perceived exertion (RPE) scale Yes  Intervention Provide education and explanation on how to use RPE scale       Expected Outcomes Long Term:  Able to use RPE to guide intensity level when exercising independently;Short Term: Able to use RPE daily in rehab to express subjective intensity level       Able to understand and use Dyspnea scale Yes       Intervention Provide education and explanation on how to use Dyspnea scale       Expected Outcomes Short Term: Able to use Dyspnea scale daily in rehab to express subjective sense of shortness of breath during exertion;Long Term: Able to use Dyspnea scale to guide intensity level when exercising independently       Knowledge and understanding of Target Heart Rate Range (THRR) Yes       Intervention Provide education and explanation of THRR including how the numbers were predicted and where they are located for reference       Expected Outcomes Short Term: Able to state/look up THRR;Short Term: Able to use daily as guideline for intensity in rehab;Long Term: Able to use THRR to govern intensity when exercising independently       Able to check pulse independently Yes       Intervention Provide education and demonstration on how to check pulse in carotid and radial arteries.;Review the importance of being able to check your own pulse for safety during independent exercise       Expected Outcomes Short Term: Able to explain why pulse checking is  important during independent exercise;Long Term: Able to check pulse independently and accurately       Understanding of Exercise Prescription Yes       Intervention Provide education, explanation, and written materials on patient's individual exercise prescription       Expected Outcomes Short Term: Able to explain program exercise prescription;Long Term: Able to explain home exercise prescription to exercise independently          Exercise Goals Re-Evaluation :  Exercise Goals Re-Evaluation     Row Name 03/03/24 1151 03/29/24 0935           Exercise Goal Re-Evaluation   Exercise Goals Review Increase Physical Activity;Increase Strength and Stamina;Understanding of Exercise Prescription Increase Strength and Stamina;Increase Physical Activity;Able to understand and use rate of perceived exertion (RPE) scale;Able to understand and use Dyspnea scale;Understanding of Exercise Prescription      Comments Dick is doing well in rehab. He has not increased his level on the nustep but his SPM are increasing slightly. He is exercsing at a RPE of 13. He stated that he does feel a difference in his energy levels after exercise. We talking about energy saviing techniques when doing activities such as grocery shopping. Dick is doing well in rehab. He states he does not do any exercise at home that this is his only exercise regimen. He is slowly increasing his SPM on the Nustep. He does have to take breaks often due to SOB.      Expected Outcomes Short: practice energy techniques   long term: continue to attend rehab. Short: Continue to attend rehab to increase strength and stamina. Long: Incorporate exercise at home.         Discharge Exercise Prescription (Final Exercise Prescription Changes):  Exercise Prescription Changes - 03/29/24 1500       Response to Exercise   Blood Pressure (Admit) 126/64    Blood Pressure (Exit) 108/64    Heart Rate (Admit) 121 bpm  Heart Rate (Exercise) 109 bpm     Heart Rate (Exit) 111 bpm    Oxygen  Saturation (Admit) 91 %    Oxygen  Saturation (Exercise) 93 %    Oxygen  Saturation (Exit) 95 %    Rating of Perceived Exertion (Exercise) 13    Perceived Dyspnea (Exercise) 3    Duration Continue with 30 min of aerobic exercise without signs/symptoms of physical distress.    Intensity THRR unchanged      Progression   Progression Continue to progress workloads to maintain intensity without signs/symptoms of physical distress.      Resistance Training   Training Prescription Yes    Weight 3    Reps 10-15      Oxygen    Oxygen  Continuous    Liters 4      NuStep   Level 2    SPM 43    Minutes 30    METs 1.5      Oxygen    Maintain Oxygen  Saturation 88% or higher          Nutrition:  Target Goals: Understanding of nutrition guidelines, daily intake of sodium 1500mg , cholesterol 200mg , calories 30% from fat and 7% or less from saturated fats, daily to have 5 or more servings of fruits and vegetables.  Biometrics:  Pre Biometrics - 02/02/24 1637       Pre Biometrics   Height 5' 9 (1.753 m)    Weight 149 lb 9.6 oz (67.9 kg)    Waist Circumference 34 inches    Hip Circumference 37.5 inches    Waist to Hip Ratio 0.91 %    BMI (Calculated) 22.08    Grip Strength 37.3 kg    Single Leg Stand 20.9 seconds           Nutrition Therapy Plan and Nutrition Goals:  Nutrition Therapy & Goals - 01/30/24 1007       Intervention Plan   Intervention Prescribe, educate and counsel regarding individualized specific dietary modifications aiming towards targeted core components such as weight, hypertension, lipid management, diabetes, heart failure and other comorbidities.;Nutrition handout(s) given to patient.    Expected Outcomes Short Term Goal: Understand basic principles of dietary content, such as calories, fat, sodium, cholesterol and nutrients.;Long Term Goal: Adherence to prescribed nutrition plan.          Nutrition  Assessments:  MEDIFICTS Score Key: >=70 Need to make dietary changes  40-70 Heart Healthy Diet <= 40 Therapeutic Level Cholesterol Diet  Flowsheet Row PULMONARY REHAB COPD ORIENTATION from [facility]/02/2024 in [facility] CARDIAC [facility]HABILITATION  Picture Your Plate Total Score on Admission 50   Picture Your Plate Scores: <59 Unhealthy dietary pattern with much room for improvement. 41-50 Dietary pattern unlikely to meet recommendations for good health and room for improvement. 51-60 More healthful dietary pattern, with some room for improvement.  >60 Healthy dietary pattern, although there may be some specific behaviors that could be improved.    Nutrition Goals Re-Evaluation:  Nutrition Goals Re-Evaluation     Row Name 03/03/24 1321 03/29/24 0932           Goals   Nutrition Goal Healthy eating Healthy eating.      Comment Dick is doing well in rehab. He stated that he is eating wha he likes to eat and really only eats about two meals a day. He does not drink a lot of water and we talked about increasing his water intake. He does drink diet sodas throughtout the day. Dick states he is  trying to eat healthy for the most part, he doesn't drink a lot of water, mostly tea. States he feels he is staying hydrated enough during the heat. Encouraged to drink more water.      Expected Outcome Short: increase water intake long term: try to choose healthy options for meals Short: increase water intake. Long: try to choose healthy options for meals.         Nutrition Goals Discharge (Final Nutrition Goals Re-Evaluation):  Nutrition Goals Re-Evaluation - 03/29/24 0932       Goals   Nutrition Goal Healthy eating.    Comment Dick states he is trying to eat healthy for the most part, he doesn't drink a lot of water, mostly tea. States he feels he is staying hydrated enough during the heat. Encouraged to drink more water.    Expected Outcome Short: increase water intake. Long: try to choose healthy  options for meals.          Psychosocial: Target Goals: Acknowledge presence or absence of significant depression and/or stress, maximize coping skills, provide positive support system. Participant is able to verbalize types and ability to use techniques and skills needed for reducing stress and depression.  Initial Review & Psychosocial Screening:  Initial Psych Review & Screening - 01/30/24 0925       Initial Review   Current issues with Current Sleep Concerns;Current Stress Concerns    Source of Stress Concerns Chronic Illness;Unable to participate in former interests or hobbies;Unable to perform yard/household activities    Comments chronic bad sleeper, wife wants him to use his ventilator at home regularly      Family Dynamics   Good Support System? Yes   wife, daughter     Barriers   Psychosocial barriers to participate in program The patient should benefit from training in stress management and relaxation.;Psychosocial barriers identified (see note)      Screening Interventions   Interventions Encouraged to exercise;To provide support and resources with identified psychosocial needs;Provide feedback about the scores to participant    Expected Outcomes Short Term goal: Utilizing psychosocial counselor, staff and physician to assist with identification of specific Stressors or current issues interfering with healing process. Setting desired goal for each stressor or current issue identified.;Long Term Goal: Stressors or current issues are controlled or eliminated.;Short Term goal: Identification and review with participant of any Quality of Life or Depression concerns found by scoring the questionnaire.;Long Term goal: The participant improves quality of Life and PHQ9 Scores as seen by post scores and/or verbalization of changes          Quality of Life Scores:  Scores of 19 and below usually indicate a poorer quality of life in these areas.  A difference of  2-3 points is a  clinically meaningful difference.  A difference of 2-3 points in the total score of the Quality of Life Index has been associated with significant improvement in overall quality of life, self-image, physical symptoms, and general health in studies assessing change in quality of life.   PHQ-9: Review Flowsheet  More data exists      01/30/2024 09/10/2023 04/16/2023 10/14/2022 08/08/2022  Depression screen PHQ 2/9  Decreased Interest 0 0 0 0 0  Down, Depressed, Hopeless 0 0 0 0 0  PHQ - 2 Score 0 0 0 0 0  Altered sleeping 0 0 - - -  Tired, decreased energy 2 0 - - -  Change in appetite 1 0 - - -  Feeling bad or  failure about yourself  0 0 - - -  Trouble concentrating 0 0 - - -  Moving slowly or fidgety/restless 0 0 - - -  Suicidal thoughts 0 0 - - -  PHQ-9 Score 3 0 - - -  Difficult doing work/chores Not difficult at all Not difficult at all - - -   Interpretation of Total Score  Total Score Depression Severity:  1-4 = Minimal depression, 5-9 = Mild depression, 10-14 = Moderate depression, 15-19 = Moderately severe depression, 20-27 = Severe depression   Psychosocial Evaluation and Intervention:  Psychosocial Evaluation - 01/30/24 1002       Psychosocial Evaluation & Interventions   Interventions Encouraged to exercise with the program and follow exercise prescription    Comments Dick is coming into pulmonary rehab for COPD.  He has a long history and has previously been worked up for transplant, but was disqualified after finding an aneursym.  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  He lives with his wife and has his daughter and her son to check on them frequently.  They have no pets or environmental concerns at home. He does use a scooter to get around due to his SOB.  His wife will help him in the shower, but otherwise he is independently with ADLs.  He is 3L oxygen  [facility] 24/7. He will use 4L in shower and walking outside.  He is not able to go and do as he pleases due to his  breathing.  He wants to be able to do more and not get so SOB.  They came to look at program prior to signing up and were pleased.  He did rehab before at [facility] prior to transplant work up. They have no barriers to attending other than $15 copay. We talked about how it can be set up as payment plan with hospital as well.    Expected Outcomes Short: Attend rehab regularly Long: Improve stamina and breathing    Continue Psychosocial Services  Follow up required by staff          Psychosocial Re-Evaluation:  Psychosocial Re-Evaluation     Row Name 03/03/24 1258 03/29/24 0931           Psychosocial Re-Evaluation   Current issues with None Identified None Identified      Comments Dick is doing well in rehab. He stated that he does not have very many issues with his sleep. He does get up during the night to use the restroom but is able to get back to sleep. He also stated that he does not have much stress in his life. He has his everyday worrying about his breathing but other then that he isnt very stressed. Dick is doing well in rehab. He identifies no stressors in his life. He states he is sleeping ok, he is a very light sleeper so he will wake up easily during the night.      Expected Outcomes Short: continue to have outlets for stress   long : continue to exericse for ovr all stress relef Short: Continue to attend rehab. Long:Continue to maintain a stress free lifestyle.      Interventions Encouraged to attend Pulmonary Rehabilitation for the exercise Encouraged to attend Pulmonary Rehabilitation for the exercise      Continue Psychosocial Services  Follow up required by staff Follow up required by staff         Psychosocial Discharge (Final Psychosocial Re-Evaluation):  Psychosocial Re-Evaluation - 03/29/24 9068  Psychosocial Re-Evaluation   Current issues with None Identified    Comments Dick is doing well in rehab. He identifies no stressors in his life. He states he is sleeping  ok, he is a very light sleeper so he will wake up easily during the night.    Expected Outcomes Short: Continue to attend rehab. Long:Continue to maintain a stress free lifestyle.    Interventions Encouraged to attend Pulmonary Rehabilitation for the exercise    Continue Psychosocial Services  Follow up required by staff           Education: Education Goals: Education classes will be provided on a weekly basis, covering required topics. Participant will state understanding/return demonstration of topics presented.  Learning Barriers/Preferences:  Learning Barriers/Preferences - 01/30/24 0924       Learning Barriers/Preferences   Learning Barriers Sight   glasses   Learning Preferences Skilled Demonstration          Education Topics: How Lungs Work and Diseases: - Discuss the anatomy of the lungs and diseases that can affect the lungs, such as COPD.   Exercise: -Discuss the importance of exercise, FITT principles of exercise, normal and abnormal responses to exercise, and how to exercise safely.   Environmental Irritants: -Discuss types of environmental irritants and how to limit exposure to environmental irritants.   Meds/Inhalers and oxygen : - Discuss respiratory medications, definition of an inhaler and oxygen , and the proper way to use an inhaler and oxygen .   Energy Saving Techniques: - Discuss methods to conserve energy and decrease shortness of breath when performing activities of daily living.  Flowsheet Row PULMONARY REHAB CHRONIC OBSTRUCTIVE PULMONARY DISEASE from 03/31/2024 in [place] [facility]  Date 03/17/24  Educator HB  Instruction Review Code 1- Verbalizes Understanding    Bronchial Hygiene / Breathing Techniques: - Discuss breathing mechanics, pursed-lip breathing technique,  proper posture, effective ways to clear airways, and other functional breathing techniques   Cleaning Equipment: - Provides group verbal and written instruction  about the health risks of elevated stress, cause of high stress, and healthy ways to reduce stress.   Nutrition I: Fats: - Discuss the types of cholesterol, what cholesterol does to the body, and how cholesterol levels can be controlled. Flowsheet Row PULMONARY REHAB CHRONIC OBSTRUCTIVE PULMONARY DISEASE from 03/31/2024 in [place] [facility]  Date 03/31/24  Educator jh  Instruction Review Code 1- Verbalizes Understanding    Nutrition II: Labels: -Discuss the different components of food labels and how to read food labels. Flowsheet Row PULMONARY REHAB CHRONIC OBSTRUCTIVE PULMONARY DISEASE from 03/31/2024 in [place] [facility]  Date 03/31/24  Educator jh  Instruction Review Code 1- Verbalizes Understanding    Respiratory Infections: - Discuss the signs and symptoms of respiratory infections, ways to prevent respiratory infections, and the importance of seeking medical treatment when having a respiratory infection.   Stress I: Signs and Symptoms: - Discuss the causes of stress, how stress may lead to anxiety and depression, and ways to limit stress. Flowsheet Row PULMONARY REHAB CHRONIC OBSTRUCTIVE PULMONARY DISEASE from 03/31/2024 in [place] [facility]  Date 02/04/24  Educator [facility]  Instruction Review Code 1- Verbalizes Understanding    Stress II: Relaxation: -Discuss relaxation techniques to limit stress. Flowsheet Row PULMONARY REHAB CHRONIC OBSTRUCTIVE PULMONARY DISEASE from 03/31/2024 in [place] [facility]  Date 02/04/24  Educator [facility]  Instruction Review Code 1- Verbalizes Understanding    Oxygen  for Home/Travel: - Discuss how to prepare for travel when on oxygen  and  proper ways to transport and store oxygen  to ensure safety.   Knowledge Questionnaire Score:  Knowledge Questionnaire Score - 02/02/24 1638       Knowledge Questionnaire Score   Pre Score 15/18          Core Components/Risk Factors/Patient Goals at  Admission:  Personal Goals and Risk Factors at Admission - 02/02/24 1639       Core Components/Risk Factors/Patient Goals on Admission    Weight Management Yes;Weight Maintenance    Intervention Weight Management: Develop a combined nutrition and exercise program designed to reach desired caloric intake, while maintaining appropriate intake of nutrient and fiber, sodium and fats, and appropriate energy expenditure required for the weight goal.;Weight Management: Provide education and appropriate resources to help participant work on and attain dietary goals.    Admit Weight 149 lb 9.6 oz (67.9 kg)    Goal Weight: Short Term 150 lb (68 kg)    Goal Weight: Long Term 150 lb (68 kg)    Expected Outcomes Short Term: Continue to assess and modify interventions until short term weight is achieved;Long Term: Adherence to nutrition and physical activity/exercise program aimed toward attainment of established weight goal;Weight Maintenance: Understanding of the daily nutrition guidelines, which includes 25-35% calories from fat, 7% or less cal from saturated fats, less than 200mg  cholesterol, less than 1.5gm of sodium, & 5 or more servings of fruits and vegetables daily    Improve shortness of breath with ADL's Yes    Intervention Provide education, individualized exercise plan and daily activity instruction to help decrease symptoms of SOB with activities of daily living.    Expected Outcomes Short Term: Improve cardiorespiratory fitness to achieve a reduction of symptoms when performing ADLs;Long Term: Be able to perform more ADLs without symptoms or delay the onset of symptoms    Increase knowledge of respiratory medications and ability to use respiratory devices properly  Yes    Intervention Provide education and demonstration as needed of appropriate use of medications, inhalers, and oxygen  therapy.    Expected Outcomes Short Term: Achieves understanding of medications use. Understands that oxygen  is a  medication prescribed by physician. Demonstrates appropriate use of inhaler and oxygen  therapy.;Long Term: Maintain appropriate use of medications, inhalers, and oxygen  therapy.    Hypertension Yes    Intervention Provide education on lifestyle modifcations including regular physical activity/exercise, weight management, moderate sodium restriction and increased consumption of fresh fruit, vegetables, and low fat dairy, alcohol moderation, and smoking cessation.;Monitor prescription use compliance.    Expected Outcomes Short Term: Continued assessment and intervention until BP is < 140/47mm HG in hypertensive participants. < 130/62mm HG in hypertensive participants with diabetes, heart failure or chronic kidney disease.;Long Term: Maintenance of blood pressure at goal levels.    Lipids Yes    Intervention Provide education and support for participant on nutrition & aerobic/resistive exercise along with prescribed medications to achieve LDL 70mg , HDL >40mg .    Expected Outcomes Short Term: Participant states understanding of desired cholesterol values and is compliant with medications prescribed. Participant is following exercise prescription and nutrition guidelines.;Long Term: Cholesterol controlled with medications as prescribed, with individualized exercise RX and with personalized nutrition plan. Value goals: LDL < 70mg , HDL > 40 mg.          Core Components/Risk Factors/Patient Goals Review:   Goals and Risk Factor Review     Row Name 03/03/24 1324 03/29/24 0933           Core Components/Risk Factors/Patient Goals Review  Personal Goals Review Weight Management/Obesity;Stress;Improve shortness of breath with ADL's Weight Management/Obesity;Improve shortness of breath with ADL's;Develop more efficient breathing techniques such as purse lipped breathing and diaphragmatic breathing and practicing self-pacing with activity.      Review Dick is doing well in rehab. He Dick is doing well in  rehab. He takes all his medications as prescribed. He checks his O2 at home and it often runs low like 85% when he has walked just a short distance. He doesn't check his BP at home, which is really hasn't been a problem for him here at rehab.      Expected Outcomes -- Short: Continue to check O2 saturation at home. Long: Report abnormalities to all healthcare professionals.         Core Components/Risk Factors/Patient Goals at Discharge (Final Review):   Goals and Risk Factor Review - 03/29/24 0933       Core Components/Risk Factors/Patient Goals Review   Personal Goals Review Weight Management/Obesity;Improve shortness of breath with ADL's;Develop more efficient breathing techniques such as purse lipped breathing and diaphragmatic breathing and practicing self-pacing with activity.    Review Dick is doing well in rehab. He takes all his medications as prescribed. He checks his O2 at home and it often runs low like 85% when he has walked just a short distance. He doesn't check his BP at home, which is really hasn't been a problem for him here at rehab.    Expected Outcomes Short: Continue to check O2 saturation at home. Long: Report abnormalities to all healthcare professionals.          ITP Comments:  ITP Comments     Row Name 01/30/24 1011 02/02/24 1630 02/04/24 0913 02/11/24 0828 03/10/24 0754   ITP Comments Completed virtual orientation today in person as he came to see program prior to signing up.  EP evaluation is scheduled for Monday May 12 at 1300 .  Documentation for diagnosis can be found in [facility] encounter 01/26/24. Patient attend orientation today.  Patient is attending Pulmonary Rehabilitation Program.  Documentation for diagnosis can be found in [facility] OV 01/26/24.  Reviewed medical chart, RPE/RPD, gym safety, and program guidelines.  Patient was fitted to equipment they will be using during rehab.  Patient is scheduled to start exercise on Wed 5/14 at 915.   Initial ITP created and sent  for review and signature by Dr. Anton Kelp, Medical Director for Pulmonary Rehabilitation Program. First full day of exercise!  Patient was oriented to gym and equipment including functions, settings, policies, and procedures.  Patient's individual exercise prescription and treatment plan were reviewed.  All starting workloads were established based on the results of the 6 minute walk test done at initial orientation visit.  The plan for exercise progression was also introduced and progression will be customized based on patient's performance and goals. 30 day review completed. ITP sent to Dr.Jehanzeb Memon, Medical Director of  Pulmonary Rehab. Continue with ITP unless changes are made by physician.  New to program. 30 day review completed. ITP sent to Dr.Jehanzeb Memon, Medical Director of Pulmonary Rehab. Continue with ITP unless changes are made by physician.    Row Name 04/05/24 [phone] 04/07/24 0830         ITP Comments Called to check on patient when he did not show for rehab today.  John [name] was having a bad breathing day and did not feel up to coming to rehab today.  He hopes to return on Wednesday and was encouraged to  use his nebulizer an extra time today. 30 day review completed. ITP sent to Dr.Jehanzeb Memon, Medical Director of  Pulmonary Rehab. Continue with ITP unless changes are made by physician.         Comments: 30 day review

## 2024-04-07 NOTE — Progress Notes (Signed)
 Daily Session Note  Patient Details  Name: John [name] MRN: [id number] Date of Birth: [birth year] Referring Provider:   Flowsheet Row PULMONARY REHAB COPD ORIENTATION from 02/02/2024 in [facility]  Referring Provider [name] MD    Encounter Date: 04/07/2024  Check In:  Session Check In - 04/07/24 0915       Check-In   Supervising physician immediately available to respond to emergencies See telemetry face sheet for immediately available MD    Location [facility]    Staff Present Laymon Rattler, BSN, RN, WTA-C;Heather Con, BS, Exercise Physiologist    Virtual Visit No    Medication changes reported     No    Fall or balance concerns reported    No    Tobacco Cessation No Change    Warm-up and Cool-down Performed on first and last piece of equipment    Resistance Training Performed Yes    VAD Patient? No    PAD/SET Patient? No      Pain Assessment   Currently in Pain? No/denies          Capillary Blood Glucose: No results found for this or any previous visit (from the past 24 hours).    Social History   Tobacco Use  Smoking Status Former   Current packs/day: 0.00   Average packs/day: 2.0 packs/day for 51.6 years (103.1 ttl pk-yrs)   Types: Cigarettes   Start date: 75   Quit date: 04/18/2020   Years since quitting: 3.9  Smokeless Tobacco Never  Tobacco Comments   2 plus packs per day. He has a 100 + pack - year history of tobacco abuse currently. Former 4 ppd for 25 years.    Goals Met:  Proper associated with RPD/PD & O2 Sat Independence with exercise equipment Improved SOB with ADL's Using PLB without cueing & demonstrates good technique Exercise tolerated well No report of concerns or symptoms today Strength training completed today  Goals Unmet:  Not Applicable  Comments: Pt able to follow exercise prescription today without complaint.  Will continue to monitor for progression.

## 2024-04-12 ENCOUNTER — Encounter (HOSPITAL_COMMUNITY)
Admission: RE | Admit: 2024-04-12 | Discharge: 2024-04-12 | Disposition: A | Discharge status: Home / Self Care | Source: Ambulatory Visit | Attending: Pulmonary Disease

## 2024-04-12 DIAGNOSIS — J439 Emphysema, unspecified: Secondary | ICD-10-CM | POA: Diagnosis not present

## 2024-04-12 DIAGNOSIS — J4489 Other specified chronic obstructive pulmonary disease: Secondary | ICD-10-CM

## 2024-04-12 NOTE — Progress Notes (Signed)
 Daily Session Note  Patient Details  Name: John [name] MRN: [id number] Date of Birth: 03/15/1956 Referring Provider:   Flowsheet Row PULMONARY REHAB COPD ORIENTATION from 02/02/2024 in [facility]  Referring Provider [name] MD    Encounter Date: 04/12/2024  Check In:  Session Check In - 04/12/24 0932       Check-In   Supervising physician immediately available to respond to emergencies See telemetry face sheet for immediately available MD    Staff Present Laymon Rattler, BSN, RN, WTA-C;Heather Con, BS, Exercise Physiologist;Phyllis Billingsley, RN;Laureen Delores, BS, RRT, CPFT    Virtual Visit No    Medication changes reported     No    Fall or balance concerns reported    No    Tobacco Cessation No Change    Warm-up and Cool-down Performed on first and last piece of equipment    Resistance Training Performed Yes    VAD Patient? No    PAD/SET Patient? No      Pain Assessment   Currently in Pain? No/denies          Capillary Blood Glucose: No results found for this or any previous visit (from the past 24 hours).    Social History   Tobacco Use  Smoking Status Former   Current packs/day: 0.00   Average packs/day: 2.0 packs/day for 51.6 years (103.1 ttl pk-yrs)   Types: Cigarettes   Start date: 11   Quit date: 04/18/2020   Years since quitting: 3.9  Smokeless Tobacco Never  Tobacco Comments   2 plus packs per day. He has a 100 + pack - year history of tobacco abuse currently. Former 4 ppd for 25 years.    Goals Met:  Proper associated with RPD/PD & O2 Sat Independence with exercise equipment Improved SOB with ADL's Using PLB without cueing & demonstrates good technique Exercise tolerated well No report of concerns or symptoms today Strength training completed today  Goals Unmet:  Not Applicable  Comments: Pt able to follow exercise prescription today without complaint.  Will continue to monitor for  progression.

## 2024-04-13 ENCOUNTER — Other Ambulatory Visit (HOSPITAL_BASED_OUTPATIENT_CLINIC_OR_DEPARTMENT_OTHER): Payer: Self-pay | Admitting: *Deleted

## 2024-04-13 MED ORDER — BREZTRI AEROSPHERE 160-9-4.8 MCG/ACT IN AERO: 2.0000 | INHALATION_SPRAY | Freq: Two times a day (BID) | RESPIRATORY_TRACT | 11 refills | Status: AC

## 2024-04-14 ENCOUNTER — Telehealth: Payer: Self-pay | Admitting: *Deleted

## 2024-04-14 ENCOUNTER — Ambulatory Visit (INDEPENDENT_AMBULATORY_CARE_PROVIDER_SITE_OTHER): Admitting: Internal Medicine

## 2024-04-14 ENCOUNTER — Encounter (HOSPITAL_COMMUNITY)

## 2024-04-14 ENCOUNTER — Encounter: Payer: Self-pay | Admitting: Internal Medicine

## 2024-04-14 VITALS — BP 135/78 | HR 105 | Temp 98.3°F | Ht 69.0 in | Wt 151.4 lb

## 2024-04-14 DIAGNOSIS — R1032 Left lower quadrant pain: Secondary | ICD-10-CM

## 2024-04-14 DIAGNOSIS — R948 Abnormal results of function studies of other organs and systems: Secondary | ICD-10-CM | POA: Diagnosis not present

## 2024-04-14 DIAGNOSIS — K625 Hemorrhage of anus and rectum: Secondary | ICD-10-CM | POA: Diagnosis not present

## 2024-04-14 DIAGNOSIS — R195 Other fecal abnormalities: Secondary | ICD-10-CM | POA: Diagnosis not present

## 2024-04-14 NOTE — Telephone Encounter (Signed)
 Patient needing pulmonary clearance   What type of surgery is being performed? COLONOSCOPY  When is surgery scheduled? TBD  Name of physician performing surgery?  Dr. Carlin Hasty [facility] Phone: [phone] Fax: [phone]  Anethesia type (none, local, MAC, general)? MAC

## 2024-04-14 NOTE — Progress Notes (Signed)
 Primary Care Physician:  Tobie Suzzane POUR, MD Primary Gastroenterologist:  Dr. Cindie  Chief Complaint  Patient presents with   Colonic Mass    Pt arrives due to colonic mass. Pt has PET scan on 03/26/23 by Dr.Alva. Pt states he has blood when wiping x1. No evidence of colon cancer.     HPI:   John [name] is a 68 y.o. male who presents to the clinic today by referral from his pulmonologist Dr. Jude for evaluation.  He has a history of severe COPD, emphysema, chronic respiratory failure on 3 L of oxygen , ruptured 9.7 cm AAA s/p endovascular aortic repair 07/28/22.  History of pulmonary nodules with increased size on CT chest 03/05/2024.  This led to subsequent PET scan.  PET scan 03/25/2024 which I personally reviewed showed 2 areas of abnormal uptake along the colon with masslike wall thickening, 1 in the proximal transverse colon, 1 along the distal sigmoid colon.  No prior colonoscopy.  No family history of colorectal malignancy.  Did have a positive Cologuard test 04/22/2022.  Was recommended at that time he undergo colonoscopy though patient decided not to pursue this.  Patient does report some intermittent left lower quadrant abdominal pain.  This seems to have subsided as of late.  Has had 1 episode of bright red blood per rectum, primarily on tissue paper.  Denies any blood clots.  No mucus in his stool.  Denies any change in stool caliber, chronic diarrhea, chronic constipation.  Past Medical History:  Diagnosis Date   Alcohol abuse 12/27/2010   Allergic rhinitis, cause unspecified 12/27/2010   Anal warts 04/11/2012   CAD, NATIVE VESSEL 10/04/2010   COPD (chronic obstructive pulmonary disease) (HCC)    a touch (09/28/2013)   History of blood transfusion 03/2012   related to nose bleed   HYPERLIPIDEMIA-MIXED 10/04/2010   HYPERTENSION, BENIGN 10/04/2010   Myocardial infarction (HCC) 09/16/2010   Pneumonia    when I was a kid   Stented coronary artery    Mid LAD     TOBACCO ABUSE 10/04/2010    Past Surgical History:  Procedure Laterality Date   ABDOMINAL AORTIC ENDOVASCULAR STENT GRAFT N/A 07/28/2022   Procedure: ABDOMINAL AORTIC ENDOVASCULAR STENT GRAFT;  Surgeon: Lanis Fonda BRAVO, MD;  Location: [facility] OR;  Service: Vascular;  Laterality: N/A;   CARDIAC CATHETERIZATION  ~ 2012   CORONARY ANGIOPLASTY WITH STENT PLACEMENT  09/17/2010; 09/28/2014   1; 2   FINGER SURGERY Left    almost cut off tip of 2nd digit   LEFT HEART CATHETERIZATION WITH CORONARY ANGIOGRAM N/A 09/28/2014   Procedure: LEFT HEART CATHETERIZATION WITH CORONARY ANGIOGRAM;  Surgeon: Lonni JONETTA Cash, MD;  Location: [facility] CATH LAB;  Service: Cardiovascular;  Laterality: N/A;   NASAL ENDOSCOPY WITH EPISTAXIS CONTROL Bilateral 03/2012   ULTRASOUND GUIDANCE FOR VASCULAR ACCESS N/A 07/28/2022   Procedure: ULTRASOUND GUIDANCE FOR VASCULAR ACCESS;  Surgeon: Lanis Fonda BRAVO, MD;  Location: [facility] OR;  Service: Vascular;  Laterality: N/A;   VASECTOMY      Current Outpatient Medications  Medication Sig Dispense Refill   albuterol  (PROAIR  HFA) 108 (90 Base) MCG/ACT inhaler Inhale 2 puffs into the lungs every 4 (four) hours as needed for wheezing or shortness of breath. 2 puffs every 4 hours as needed only  if your can't catch your breath 8 g 5   albuterol  (PROVENTIL ) (2.5 MG/3ML) 0.083% nebulizer solution Take 3 mLs (2.5 mg total) by nebulization every 4 (four) hours as needed for wheezing  or shortness of breath. 360 mL 5   aspirin  EC 81 MG tablet Take 81 mg by mouth daily.     atorvastatin  (LIPITOR ) 40 MG tablet Take 1 tablet (40 mg total) by mouth daily. 90 tablet 3   budesonide -glycopyrrolate -formoterol  (BREZTRI  AEROSPHERE) 160-9-4.8 MCG/ACT AERO inhaler Inhale 2 puffs into the lungs in the morning and at bedtime. 10.7 g 11   furosemide  (LASIX ) 40 MG tablet Take 1 tablet (40 mg total) by mouth daily. 90 tablet 2   guaiFENesin  (MUCINEX ) 600 MG 12 hr tablet Take 2 tablets (1,200 mg total) by mouth 2  (two) times daily as needed for cough or to loosen phlegm.     Multiple Vitamin (MULTIVITAMIN WITH MINERALS) TABS tablet Take 1 tablet by mouth daily.     nitroGLYCERIN  (NITROSTAT ) 0.4 MG SL tablet Place 1 tablet (0.4 mg total) under the tongue every 5 (five) minutes as needed for chest pain. 25 tablet 6   OXYGEN  Inhale 2-3 L into the lungs.     No current facility-administered medications for this visit.    Allergies as of 04/14/2024 - Review Complete 04/14/2024  Allergen Reaction Noted   Penicillins Other (See Comments) 12/27/2010    Family History  Problem Relation Age of Onset   Brain cancer Mother    Heart disease Father     Social History   Socioeconomic History   Marital status: Single    Spouse name: Not on file   Number of children: Not on file   Years of education: Not on file   Highest education level: Not on file  Occupational History   Occupation: Lobbyist maintenance work     Employer: [employer]    Comment: [employer]  Tobacco Use   Smoking status: Former    Current packs/day: 0.00    Average packs/day: 2.0 packs/day for 51.6 years (103.1 ttl pk-yrs)    Types: Cigarettes    Start date: 47    Quit date: 04/18/2020    Years since quitting: 3.9   Smokeless tobacco: Never   Tobacco comments:    2 plus packs per day. He has a 100 + pack - year history of tobacco abuse currently. Former 4 ppd for 25 years.  Vaping Use   Vaping status: Never Used  Substance and Sexual Activity   Alcohol use: Yes    Alcohol/week: 144.0 standard drinks of alcohol    Types: 144 Cans of beer per week    Comment:  history 12 pack of beer per night.  2021- drinks 3 beer/day   Drug use: No   Sexual activity: Yes    Birth control/protection: None  Other Topics Concern   Not on file  Social History Narrative   The patient lives in [place] with his girlfriend. 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   Social Drivers of Research scientist (physical sciences) Strain: Low Risk  (09/10/2023)   Overall Financial Resource Strain (CARDIA)    Difficulty of Paying Living Expenses: Not hard at all  Food Insecurity: No Food Insecurity (09/10/2023)   Hunger Vital Sign    Worried About Running Out of Food in the Last Year: Never true    Ran Out of Food in the Last Year: Never true  Transportation Needs: No Transportation Needs (09/10/2023)   PRAPARE - Administrator, Civil Service (Medical): No    Lack of Transportation (Non-Medical): No  Physical Activity: Inactive (09/10/2023)  Exercise Vital Sign    Days of Exercise per Week: 0 days    Minutes of Exercise per Session: 0 min  Stress: No Stress Concern Present (09/10/2023)   [employer] of Occupational Health - Occupational Stress Questionnaire    Feeling of Stress : Not at all  Social Connections: Moderately Integrated (09/10/2023)   Social Connection and Isolation Panel    Frequency of Communication with Friends and Family: More than three times a week    Frequency of Social Gatherings with Friends and Family: Three times a week    Attends Religious Services: Never    Active Member of Clubs or Organizations: Yes    Attends Banker Meetings: Never    Marital Status: Living with partner  Intimate Partner Violence: Not At Risk (09/10/2023)   Humiliation, Afraid, Rape, and Kick questionnaire    Fear of Current or Ex-Partner: No    Emotionally Abused: No    Physically Abused: No    Sexually Abused: No    Subjective: Review of Systems  Constitutional:  Negative for chills and fever.  HENT:  Negative for congestion and hearing loss.   Eyes:  Negative for blurred vision and double vision.  Respiratory:  Negative for cough and shortness of breath.   Cardiovascular:  Negative for chest pain and palpitations.  Gastrointestinal:  Negative for abdominal pain, blood in stool, constipation, diarrhea, heartburn, melena and vomiting.  Genitourinary:  Negative for  dysuria and urgency.  Musculoskeletal:  Negative for joint pain and myalgias.  Skin:  Negative for itching and rash.  Neurological:  Negative for dizziness and headaches.  Psychiatric/Behavioral:  Negative for depression. The patient is not nervous/anxious.        Objective: BP 135/78   Pulse (!) 105   Temp 98.3 F (36.8 C)   Ht 5' 9 (1.753 m)   Wt 151 lb 6.4 oz (68.7 kg)   BMI 22.36 kg/m  Physical Exam Constitutional:      Appearance: Normal appearance.  HENT:     Head: Normocephalic and atraumatic.  Eyes:     Extraocular Movements: Extraocular movements intact.     Conjunctiva/sclera: Conjunctivae normal.  Cardiovascular:     Rate and Rhythm: Normal rate and regular rhythm.  Pulmonary:     Effort: Pulmonary effort is normal.     Breath sounds: Normal breath sounds. Decreased air movement present.  Abdominal:     General: Bowel sounds are normal.     Palpations: Abdomen is soft.  Musculoskeletal:        General: Normal range of motion.     Cervical back: Normal range of motion and neck supple.  Skin:    General: Skin is warm.  Neurological:     General: No focal deficit present.     Mental Status: He is alert and oriented to person, place, and time.  Psychiatric:        Mood and Affect: Mood normal.        Behavior: Behavior normal.      Assessment: *Abnormal PET scan of the colon *Left lower quadrant abdominal pain *Rectal bleeding *Positive Cologuard testing  Plan: Discussed PET findings in depth with patient and his significant other today.  Also showed them imaging.  Discussed my concern for colon malignancy and need for urgent colonoscopy.  Patient has increased risk for anesthesia given his COPD and chronic respiratory failure.  We will reach out to his pulmonologist for clearance in this regard.  Appreciate Dr. Jude help  immensely.  Once we have heard back, we will likely schedule for colonoscopy. The risks including infection, bleed, or  perforation as well as benefits, limitations, alternatives and imponderables have been reviewed with the patient. Questions have been answered. All parties agreeable.  Thank you Dr. Alva for the kind referral.  04/14/2024 8:30 AM   Disclaimer: This note was dictated with voice recognition software. Similar sounding words can inadvertently be transcribed and may not be corrected upon review.

## 2024-04-14 NOTE — Patient Instructions (Signed)
 We will schedule you for colonoscopy given your abnormal PET scan findings.  Will reach out to your pulmonologist for clearance prior to scheduling.  Once we have heard back, we will reach out to you.  It was very nice meeting both you today.  Dr. Cindie

## 2024-04-18 DIAGNOSIS — J9610 Chronic respiratory failure, unspecified whether with hypoxia or hypercapnia: Secondary | ICD-10-CM | POA: Diagnosis not present

## 2024-04-18 DIAGNOSIS — J449 Chronic obstructive pulmonary disease, unspecified: Secondary | ICD-10-CM | POA: Diagnosis not present

## 2024-04-19 ENCOUNTER — Encounter (HOSPITAL_COMMUNITY)

## 2024-04-20 ENCOUNTER — Other Ambulatory Visit: Payer: Self-pay | Admitting: *Deleted

## 2024-04-20 DIAGNOSIS — R948 Abnormal results of function studies of other organs and systems: Secondary | ICD-10-CM

## 2024-04-20 DIAGNOSIS — R195 Other fecal abnormalities: Secondary | ICD-10-CM

## 2024-04-20 DIAGNOSIS — R1032 Left lower quadrant pain: Secondary | ICD-10-CM

## 2024-04-20 DIAGNOSIS — K625 Hemorrhage of anus and rectum: Secondary | ICD-10-CM

## 2024-04-20 NOTE — Telephone Encounter (Signed)
 Dr. Cindie spoke with Dr. Rebecka and not candidate at [facility]. Will need to go to [place]. Dr. Cindie spoke with doc of day at [facility] and was advised to send urgent referral to them. Referral placed   Called patient and he is aware referral sent to expect called from [facility]. He voiced understanding

## 2024-04-21 ENCOUNTER — Telehealth: Payer: Self-pay

## 2024-04-21 ENCOUNTER — Encounter (HOSPITAL_COMMUNITY)
Admission: RE | Admit: 2024-04-21 | Discharge: 2024-04-21 | Disposition: A | Discharge status: Home / Self Care | Source: Ambulatory Visit | Attending: Pulmonary Disease

## 2024-04-21 DIAGNOSIS — J4489 Other specified chronic obstructive pulmonary disease: Secondary | ICD-10-CM | POA: Diagnosis not present

## 2024-04-21 DIAGNOSIS — J439 Emphysema, unspecified: Secondary | ICD-10-CM | POA: Diagnosis not present

## 2024-04-21 NOTE — Progress Notes (Signed)
 Daily Session Note  Patient Details  Name: MATTIAS WALMSLEY MRN: [id number] Date of Birth: [birth year] Referring Provider:   Flowsheet Row PULMONARY REHAB COPD ORIENTATION from 02/02/2024 in [facility] CARDIAC REHABILITATION  Referring Provider [name] MD    Encounter Date: 04/21/2024  Check In:  Session Check In - 04/21/24 0934       Check-In   Supervising physician immediately available to respond to emergencies See telemetry face sheet for immediately available MD    Location [facility]    Staff Present Laymon Rattler, BSN, RN, Rosalba Gelineau, MA, RCEP, CCRP, CCET    Virtual Visit No    Medication changes reported     No    Fall or balance concerns reported    No    Tobacco Cessation No Change    Warm-up and Cool-down Performed on first and last piece of equipment    Resistance Training Performed Yes    VAD Patient? No    PAD/SET Patient? No      Pain Assessment   Currently in Pain? No/denies          Capillary Blood Glucose: No results found for this or any previous visit (from the past 24 hours).    Social History   Tobacco Use  Smoking Status Former   Current packs/day: 0.00   Average packs/day: 2.0 packs/day for 51.6 years (103.1 ttl pk-yrs)   Types: Cigarettes   Start date: 29   Quit date: 04/18/2020   Years since quitting: 4.0  Smokeless Tobacco Never  Tobacco Comments   2 plus packs per day. He has a 100 + pack - year history of tobacco abuse currently. Former 4 ppd for 25 years.    Goals Met:  Proper associated with RPD/PD & O2 Sat Independence with exercise equipment Improved SOB with ADL's Using PLB without cueing & demonstrates good technique Exercise tolerated well No report of concerns or symptoms today Strength training completed today  Goals Unmet:  Not Applicable  Comments: Pt able to follow exercise prescription today without complaint.  Will continue to monitor for progression.

## 2024-04-21 NOTE — Telephone Encounter (Signed)
 Talked with patient. He is scheduled to see Dr. Suzann next Wednesday at 1:30 pm. Patient will arrive at 1:15 pm for registration. I provided patient with the office address, he is aware this information is available in MyChart as well. Patient verbalized understanding and had no concerns at the end of the call.

## 2024-04-26 ENCOUNTER — Encounter: Payer: Self-pay | Admitting: Internal Medicine

## 2024-04-26 ENCOUNTER — Ambulatory Visit (INDEPENDENT_AMBULATORY_CARE_PROVIDER_SITE_OTHER): Payer: Medicare Other | Admitting: Internal Medicine

## 2024-04-26 ENCOUNTER — Encounter (HOSPITAL_COMMUNITY)
Admission: RE | Admit: 2024-04-26 | Discharge: 2024-04-26 | Disposition: A | Discharge status: Home / Self Care | Source: Ambulatory Visit | Attending: Pulmonary Disease | Admitting: Pulmonary Disease

## 2024-04-26 VITALS — BP 132/71 | HR 116 | Ht 69.0 in | Wt 150.8 lb

## 2024-04-26 DIAGNOSIS — K5520 Angiodysplasia of colon without hemorrhage: Secondary | ICD-10-CM | POA: Insufficient documentation

## 2024-04-26 DIAGNOSIS — Z8679 Personal history of other diseases of the circulatory system: Secondary | ICD-10-CM

## 2024-04-26 DIAGNOSIS — J439 Emphysema, unspecified: Secondary | ICD-10-CM | POA: Insufficient documentation

## 2024-04-26 DIAGNOSIS — J4489 Other specified chronic obstructive pulmonary disease: Secondary | ICD-10-CM | POA: Diagnosis not present

## 2024-04-26 DIAGNOSIS — I2510 Atherosclerotic heart disease of native coronary artery without angina pectoris: Secondary | ICD-10-CM

## 2024-04-26 DIAGNOSIS — Z9889 Other specified postprocedural states: Secondary | ICD-10-CM

## 2024-04-26 DIAGNOSIS — J9612 Chronic respiratory failure with hypercapnia: Secondary | ICD-10-CM

## 2024-04-26 DIAGNOSIS — J9611 Chronic respiratory failure with hypoxia: Secondary | ICD-10-CM

## 2024-04-26 DIAGNOSIS — I739 Peripheral vascular disease, unspecified: Secondary | ICD-10-CM | POA: Diagnosis not present

## 2024-04-26 NOTE — Assessment & Plan Note (Signed)
 On 3 L O2 at home Due to underlying COPD On Breztri Albuterol nebs and inhaler as needed Follows up with Dr. Vassie Loll Had evaluation by transplant team, not a surgical candidate

## 2024-04-26 NOTE — Progress Notes (Signed)
 Daily Session Note  Patient Details  Name: SAXTON CHAIN MRN: [id number] Date of Birth: 1956/05/07 Referring Provider:   Flowsheet Row PULMONARY REHAB COPD ORIENTATION from 02/02/2024 in [facility] CARDIAC REHABILITATION  Referring Provider [name] MD    Encounter Date: 04/26/2024  Check In:  Session Check In - 04/26/24 0915       Check-In   Supervising physician immediately available to respond to emergencies See telemetry face sheet for immediately available MD    Location [facility]    Staff Present Powell Benders, BS, Exercise Physiologist;Jessica Vonzell, MA, RCEP, CCRP, CCET;Brittany Jackquline, BSN, RN, WTA-C    Virtual Visit No    Medication changes reported     No    Fall or balance concerns reported    No    Tobacco Cessation No Change    Warm-up and Cool-down Performed on first and last piece of equipment    Resistance Training Performed Yes    VAD Patient? No    PAD/SET Patient? No      Pain Assessment   Currently in Pain? No/denies    Multiple Pain Sites No          Capillary Blood Glucose: No results found for this or any previous visit (from the past 24 hours).    Social History   Tobacco Use  Smoking Status Former   Current packs/day: 0.00   Average packs/day: 2.0 packs/day for 51.6 years (103.1 ttl pk-yrs)   Types: Cigarettes   Start date: 50   Quit date: 04/18/2020   Years since quitting: 4.0  Smokeless Tobacco Never  Tobacco Comments   2 plus packs per day. He has a 100 + pack - year history of tobacco abuse currently. Former 4 ppd for 25 years.    Goals Met:  Independence with exercise equipment Using PLB without cueing & demonstrates good technique Exercise tolerated well No report of concerns or symptoms today Strength training completed today  Goals Unmet:  Not Applicable  Comments: Pt able to follow exercise prescription today without complaint.  Will continue to monitor for progression.

## 2024-04-26 NOTE — Assessment & Plan Note (Signed)
 S/p stent placement in 2011 and 2015 On aspirin and statin Did not tolerate beta-blocker in the past Follows up with cardiology

## 2024-04-26 NOTE — Assessment & Plan Note (Signed)
For ruptured abdominal aortic aneurysm Followed by vascular surgery Recent US of abdomen: Patent endovascular aneurysm repair with no evidence of endoleak. The largest aortic diameter has decreased compared to prior exam.

## 2024-04-26 NOTE — Assessment & Plan Note (Signed)
 Noted on PET scan Has appointment with GI for considering colonoscopy

## 2024-04-26 NOTE — Patient Instructions (Addendum)
 Please continue to take medications as prescribed.  Please continue to follow low salt diet and ambulate as tolerated.  Please get flu vaccine at our clinic or any local pharmacy in the next month.

## 2024-04-26 NOTE — Assessment & Plan Note (Signed)
 His right toe discoloration is concerning for PAD considering his history of CAD Has been evaluated by vascular surgery On aspirin  and statin currently

## 2024-04-26 NOTE — Progress Notes (Signed)
 Established Patient Office Visit  Subjective:  Patient ID: John [name], male    DOB: 1955/10/12  Age: 69 y.o. MRN: [id number]  CC:  Chief Complaint  Patient presents with   Hypertension    Six month follow up    HPI John [name] is a 68 y.o. male with past medical history of CAD s/p stent placement, chronic hypoxic respiratory failure due to COPD and chronic leg edema who presents for f/u of his chronic medical conditions.  CAD and HTN: He has h/o CAD and follows up with Cardiology. BP is well-controlled. Takes medications regularly. Patient denies headache, dizziness, chest pain, or palpitations.   Chronic hypoxic respiratory failure due to COPD: He has been using home O2 - 3 lpm currently.  Denies any recent worsening of dyspnea, cough, or fatigue.  Denies any fever, chills, sore throat or nasal congestion.  He has been using Breztri  and as needed albuterol  for dyspnea.  Of note, he also reports purplish discoloration of right foot toes upon sitting for few minutes.  Reports improvement in color of the toes upon leg elevation.  He also reports sharp pain in the toes when he has noticed purplish discoloration.  He has limited ambulation due to chronic hypoxic respiratory failure. He was evaluated by vascular surgery, and he has been advised to continue medical treatment with aspirin  and statin.  He recently had a PET scan, which showed possible GI AVM in colon area.  He has appointment with [facility].  He currently denies melena or hematochezia.     Past Medical History:  Diagnosis Date   Alcohol abuse 12/27/2010   Allergic rhinitis, cause unspecified 12/27/2010   Anal warts 04/11/2012   CAD, NATIVE VESSEL 10/04/2010   COPD (chronic obstructive pulmonary disease) (HCC)    a touch (09/28/2013)   History of blood transfusion 03/2012   related to nose bleed   HYPERLIPIDEMIA-MIXED 10/04/2010   HYPERTENSION, BENIGN 10/04/2010   Myocardial infarction (HCC) 09/16/2010    Pneumonia    when I was a kid   Stented coronary artery    Mid LAD    TOBACCO ABUSE 10/04/2010    Past Surgical History:  Procedure Laterality Date   ABDOMINAL AORTIC ENDOVASCULAR STENT GRAFT N/A 07/28/2022   Procedure: ABDOMINAL AORTIC ENDOVASCULAR STENT GRAFT;  Surgeon: Lanis Fonda BRAVO, MD;  Location: [facility] OR;  Service: Vascular;  Laterality: N/A;   CARDIAC CATHETERIZATION  ~ 2012   CORONARY ANGIOPLASTY WITH STENT PLACEMENT  09/17/2010; 09/28/2014   1; 2   FINGER SURGERY Left    almost cut off tip of 2nd digit   LEFT HEART CATHETERIZATION WITH CORONARY ANGIOGRAM N/A 09/28/2014   Procedure: LEFT HEART CATHETERIZATION WITH CORONARY ANGIOGRAM;  Surgeon: Lonni JONETTA Cash, MD;  Location: [facility] CATH LAB;  Service: Cardiovascular;  Laterality: N/A;   NASAL ENDOSCOPY WITH EPISTAXIS CONTROL Bilateral 03/2012   ULTRASOUND GUIDANCE FOR VASCULAR ACCESS N/A 07/28/2022   Procedure: ULTRASOUND GUIDANCE FOR VASCULAR ACCESS;  Surgeon: Lanis Fonda BRAVO, MD;  Location: [facility] OR;  Service: Vascular;  Laterality: N/A;   VASECTOMY      Family History  Problem Relation Age of Onset   Brain cancer Mother    Heart disease Father     Social History   Socioeconomic History   Marital status: Single    Spouse name: Not on file   Number of children: Not on file   Years of education: Not on file   Highest education level: Not on file  Occupational History   Occupation: Geographical information systems officer work     Associate Professor: [employer]    Comment: [employer]  Tobacco Use   Smoking status: Former    Current packs/day: 0.00    Average packs/day: 2.0 packs/day for 51.6 years (103.1 ttl pk-yrs)    Types: Cigarettes    Start date: 33    Quit date: 04/18/2020    Years since quitting: 4.0   Smokeless tobacco: Never   Tobacco comments:    2 plus packs per day. He has a 100 + pack - year history of tobacco abuse currently. Former 4 ppd for 25 years.  Vaping Use   Vaping status: Never Used  Substance and Sexual  Activity   Alcohol use: Yes    Alcohol/week: 144.0 standard drinks of alcohol    Types: 144 Cans of beer per week    Comment:  history 12 pack of beer per night.  2021- drinks 3 beer/day   Drug use: No   Sexual activity: Yes    Birth control/protection: None  Other Topics Concern   Not on file  Social History Narrative   The patient lives in [place] with his girlfriend. 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   Social Drivers of Corporate investment banker Strain: Low Risk  (09/10/2023)   Overall Financial Resource Strain (CARDIA)    Difficulty of Paying Living Expenses: Not hard at all  Food Insecurity: No Food Insecurity (09/10/2023)   Hunger Vital Sign    Worried About Running Out of Food in the Last Year: Never true    Ran Out of Food in the Last Year: Never true  Transportation Needs: No Transportation Needs (09/10/2023)   PRAPARE - Administrator, Civil Service (Medical): No    Lack of Transportation (Non-Medical): No  Physical Activity: Inactive (09/10/2023)   Exercise Vital Sign    Days of Exercise per Week: 0 days    Minutes of Exercise per Session: 0 min  Stress: No Stress Concern Present (09/10/2023)   [employer] of Occupational Health - Occupational Stress Questionnaire    Feeling of Stress : Not at all  Social Connections: Moderately Integrated (09/10/2023)   Social Connection and Isolation Panel    Frequency of Communication with Friends and Family: More than three times a week    Frequency of Social Gatherings with Friends and Family: Three times a week    Attends Religious Services: Never    Active Member of Clubs or Organizations: Yes    Attends Banker Meetings: Never    Marital Status: Living with partner  Intimate Partner Violence: Not At Risk (09/10/2023)   Humiliation, Afraid, Rape, and Kick questionnaire    Fear of Current or Ex-Partner: No    Emotionally Abused: No    Physically Abused:  No    Sexually Abused: No    Outpatient Medications Prior to Visit  Medication Sig Dispense Refill   albuterol  (PROAIR  HFA) 108 (90 Base) MCG/ACT inhaler Inhale 2 puffs into the lungs every 4 (four) hours as needed for wheezing or shortness of breath. 2 puffs every 4 hours as needed only  if your can't catch your breath 8 g 5   albuterol  (PROVENTIL ) (2.5 MG/3ML) 0.083% nebulizer solution Take 3 mLs (2.5 mg total) by nebulization every 4 (four) hours as needed for wheezing or shortness of breath. 360 mL 5   aspirin  EC 81 MG tablet Take 81  mg by mouth daily.     atorvastatin  (LIPITOR ) 40 MG tablet Take 1 tablet (40 mg total) by mouth daily. 90 tablet 3   budesonide -glycopyrrolate -formoterol  (BREZTRI  AEROSPHERE) 160-9-4.8 MCG/ACT AERO inhaler Inhale 2 puffs into the lungs in the morning and at bedtime. 10.7 g 11   furosemide  (LASIX ) 40 MG tablet Take 1 tablet (40 mg total) by mouth daily. 90 tablet 2   guaiFENesin  (MUCINEX ) 600 MG 12 hr tablet Take 2 tablets (1,200 mg total) by mouth 2 (two) times daily as needed for cough or to loosen phlegm.     Multiple Vitamin (MULTIVITAMIN WITH MINERALS) TABS tablet Take 1 tablet by mouth daily.     nitroGLYCERIN  (NITROSTAT ) 0.4 MG SL tablet Place 1 tablet (0.4 mg total) under the tongue every 5 (five) minutes as needed for chest pain. 25 tablet 6   OXYGEN  Inhale 2-3 L into the lungs.     No facility-administered medications prior to visit.    Allergies  Allergen Reactions   Penicillins Other (See Comments)    Passes out    ROS Review of Systems  Constitutional:  Negative for chills and fever.  HENT:  Negative for congestion and sore throat.   Eyes:  Negative for pain and discharge.  Respiratory:  Positive for cough and shortness of breath (Chronic).   Cardiovascular:  Negative for chest pain and palpitations.  Gastrointestinal:  Negative for diarrhea, nausea and vomiting.  Endocrine: Negative for polydipsia and polyuria.  Genitourinary:   Negative for dysuria and hematuria.  Musculoskeletal:  Negative for neck pain and neck stiffness.  Skin:  Positive for color change (Intermittent purplish discoloration of right toes). Negative for rash.  Neurological:  Negative for dizziness, weakness, numbness and headaches.  Psychiatric/Behavioral:  Negative for agitation and behavioral problems.       Objective:    Physical Exam Vitals reviewed.  Constitutional:      General: He is not in acute distress.    Appearance: He is not diaphoretic.     Comments: In electric scooter  HENT:     Head: Normocephalic and atraumatic.     Nose: Nose normal.     Mouth/Throat:     Mouth: Mucous membranes are moist.  Eyes:     General: No scleral icterus.    Extraocular Movements: Extraocular movements intact.  Cardiovascular:     Rate and Rhythm: Normal rate and regular rhythm.     Heart sounds: Normal heart sounds. No murmur heard.    Comments: Weak DPA pulse on right LE Pulmonary:     Breath sounds: Normal breath sounds. No wheezing or rales.     Comments: On 3 l O2 Musculoskeletal:     Cervical back: Neck supple. No tenderness.     Right lower leg: No edema.     Left lower leg: No edema.  Feet:     Right foot:     Toenail Condition: Right toenails are abnormally thick. Fungal disease present. Skin:    General: Skin is warm.     Findings: No rash.     Comments: Erythema of right toes  Neurological:     General: No focal deficit present.     Mental Status: He is alert and oriented to person, place, and time.     Sensory: No sensory deficit.     Motor: No weakness.  Psychiatric:        Mood and Affect: Mood normal.        Behavior: Behavior normal.  BP 132/71   Pulse (!) 116   Ht 5' 9 (1.753 m)   Wt 150 lb 12.8 oz (68.4 kg)   SpO2 (!) 86%   BMI 22.27 kg/m  Wt Readings from Last 3 Encounters:  04/26/24 150 lb 12.8 oz (68.4 kg)  04/14/24 151 lb 6.4 oz (68.7 kg)  02/02/24 149 lb 9.6 oz (67.9 kg)    Lab Results   Component Value Date   TSH 2.650 10/14/2022   Lab Results  Component Value Date   WBC 8.3 10/21/2023   HGB 12.9 (L) 10/21/2023   HCT 39.5 10/21/2023   MCV 99 (H) 10/21/2023   PLT 198 10/21/2023   Lab Results  Component Value Date   NA 145 (H) 10/21/2023   K 4.5 10/21/2023   CO2 38 (H) 10/21/2023   GLUCOSE 101 (H) 10/21/2023   BUN 17 10/21/2023   CREATININE 1.12 10/21/2023   BILITOT 0.3 10/21/2023   ALKPHOS 154 (H) 10/21/2023   AST 30 10/21/2023   ALT 24 10/21/2023   PROT 6.5 10/21/2023   ALBUMIN 4.1 10/21/2023   CALCIUM  9.5 10/21/2023   ANIONGAP 10 07/30/2022   EGFR 72 10/21/2023   GFR 92.34 06/14/2020   Lab Results  Component Value Date   CHOL 178 10/21/2023   Lab Results  Component Value Date   HDL 86 10/21/2023   Lab Results  Component Value Date   LDLCALC 81 10/21/2023   Lab Results  Component Value Date   TRIG 57 10/21/2023   Lab Results  Component Value Date   CHOLHDL 2.1 10/21/2023   Lab Results  Component Value Date   HGBA1C 5.1 04/24/2020      Assessment & Plan:   Problem List Items Addressed This Visit       Cardiovascular and Mediastinum   CAD, NATIVE VESSEL - Primary   S/p stent placement in 2011 and 2015 On aspirin  and statin Did not tolerate beta-blocker in the past Follows up with cardiology      PAD (peripheral artery disease) (HCC)   His right toe discoloration is concerning for PAD considering his history of CAD Has been evaluated by vascular surgery On aspirin  and statin currently      GI AVM (gastrointestinal arteriovenous vascular malformation)   Noted on PET scan Has appointment with GI for considering colonoscopy        Respiratory   Chronic respiratory failure with hypoxia and hypercapnia (HCC)   On 3 L O2 at home Due to underlying COPD On Breztri  Albuterol  nebs and inhaler as needed Follows up with Dr. Jude Had evaluation by transplant team, not a surgical candidate      Chronic obstructive pulmonary  disease (HCC)   Well-controlled currently with Breztri  Albuterol  PRN F/u with Pulmonology        Other   Status post endovascular aneurysm repair (EVAR)   For ruptured abdominal aortic aneurysm Followed by vascular surgery Recent US  of abdomen: Patent endovascular aneurysm repair with no evidence of endoleak. The largest aortic diameter has decreased compared to prior exam.           No orders of the defined types were placed in this encounter.   Follow-up: Return in about 6 months (around 10/27/2024) for Annual physical.    Suzzane MARLA Blanch, MD

## 2024-04-26 NOTE — Assessment & Plan Note (Signed)
 Well-controlled currently with Breztri Albuterol PRN F/u with Pulmonology

## 2024-04-27 NOTE — Progress Notes (Signed)
 [facility] Initial Consultation   Referring Provider Tobie Suzzane POUR, MD [address] [place],  [place] [address]  Dr. Carlin Hasty  Primary Care Provider Tobie Suzzane POUR, MD  Patient Profile: John [name] is a 68 y.o. male who is seen in consultation in the [facility] at the request of Dr. Tobie for evaluation and management of the problem(s) noted below.  Problem List: Abnormal PET scan-colon wall thickening, possible colon mass Positive Cologuard test 04/22/2022 Rectal bleeding Left lower quadrant abdominal pain IDA   History of Present Illness   John [name] is a 68 y.o. male with a history of severe COPD (MZ), emphysema, chronic respiratory failure on 3 L of oxygen , ruptured 9.7 cm AAA status post endovascular aortic repair 07/2022, CAD status post NSTEMI 2011, chronic diastolic heart failure (EF 65-70% 03/2020), HLD PAD pulmonary nodules who presents to the office for evaluation of concern of colonic neoplasm  Discussed the use of AI scribe software for clinical note transcription with the patient, who gave verbal consent to proceed.  History of Present Illness Colorectal neoplasm evaluation - Referred for colonoscopy due to high risk for colon cancer associated with comorbid conditions  - John [name] has a history of pulmonary nodules increased in size on chest CT 03/05/2024  - Follow-up PET scan demonstrated 2 areas of abnormal uptake along the colon with masslike thickening in the proximal transverse colon as well as the distal sigmoid colon. - No prior history of colonoscopy - Had positive Cologuard test 04/22/2022 and advised to undergo colonoscopy which he deferred  - Endorses single episode of hematochezia but no chronic rectal bleeding - Has experienced left-sided abdominal pain which he attributes to previous repair of aortic aneurysm -pain has improved over time. - No diarrhea, constipation, change in stool caliber - No known family  history of colon cancer or polyps  - Dr. Hasty reviewed case with patient's pulmonologist Dr. Jude -deemed too high risk for procedure Zelda Salmon and subsequently referred to our office for hospital-based procedure  Medical comorbidities Pulmonary nodules and respiratory symptoms - Lung nodules with interval growth on recent CT imaging - COPD requiring continuous oxygen  therapy at 3-4 L/min, increased to 4 L/min with activity - Progressive dyspnea over the past year - Chronic cough - Uses nebulizer twice daily and an emergency inhaler  CAD status post NSTEMI 2011, diastolic heart failure - Denies chest pain or angina - Not on anticoagulants with the exception of aspirin  81 mg orally daily - Last echo 03/2020-EF 65 to 70% - Describes himself as a free bleeder with bleeding tendencies  Bleeding tendency - Describes himself as a 'free bleeder' - Not taking anticoagulant medications  Anesthesia and surgical history - No general anesthesia since finger amputation in early 2000s per patient - Sedation for prior aneurysm repair 2023 -Fentanyl  and Versed  per chart review  GI Review of Symptoms Significant for left lower quadrant abdominal pain, rectal bleeding. Otherwise negative.  General Review of Systems  Review of systems is significant for the pertinent positives and negatives as listed per the HPI.  Full ROS is otherwise negative.  Past Medical History   Past Medical History:  Diagnosis Date   Alcohol abuse 12/27/2010   Allergic rhinitis, cause unspecified 12/27/2010   Anal warts 04/11/2012   CAD, NATIVE VESSEL 10/04/2010   COPD (chronic obstructive pulmonary disease) (HCC)    a touch (09/28/2013)   History of blood transfusion 03/2012   related to nose bleed   HYPERLIPIDEMIA-MIXED 10/04/2010  HYPERTENSION, BENIGN 10/04/2010   Myocardial infarction (HCC) 09/16/2010   Pneumonia    when I was a kid   Stented coronary artery    Mid LAD    TOBACCO ABUSE 10/04/2010     Past  Surgical History   Past Surgical History:  Procedure Laterality Date   ABDOMINAL AORTIC ENDOVASCULAR STENT GRAFT N/A 07/28/2022   Procedure: ABDOMINAL AORTIC ENDOVASCULAR STENT GRAFT;  Surgeon: Lanis Fonda BRAVO, MD;  Location: [facility] OR;  Service: Vascular;  Laterality: N/A;   CARDIAC CATHETERIZATION  ~ 2012   CORONARY ANGIOPLASTY WITH STENT PLACEMENT  09/17/2010; 09/28/2014   1; 2   FINGER SURGERY Left    almost cut off tip of 2nd digit   LEFT HEART CATHETERIZATION WITH CORONARY ANGIOGRAM N/A 09/28/2014   Procedure: LEFT HEART CATHETERIZATION WITH CORONARY ANGIOGRAM;  Surgeon: Lonni JONETTA Cash, MD;  Location: [facility] CATH LAB;  Service: Cardiovascular;  Laterality: N/A;   NASAL ENDOSCOPY WITH EPISTAXIS CONTROL Bilateral 03/2012   ULTRASOUND GUIDANCE FOR VASCULAR ACCESS N/A 07/28/2022   Procedure: ULTRASOUND GUIDANCE FOR VASCULAR ACCESS;  Surgeon: Lanis Fonda BRAVO, MD;  Location: [facility] OR;  Service: Vascular;  Laterality: N/A;   VASECTOMY       Allergies and Medications   Allergies  Allergen Reactions   Penicillins Other (See Comments)    Passes out    Current Meds  Medication Sig   albuterol  (PROAIR  HFA) 108 (90 Base) MCG/ACT inhaler Inhale 2 puffs into the lungs every 4 (four) hours as needed for wheezing or shortness of breath. 2 puffs every 4 hours as needed only  if your can't catch your breath   albuterol  (PROVENTIL ) (2.5 MG/3ML) 0.083% nebulizer solution Take 3 mLs (2.5 mg total) by nebulization every 4 (four) hours as needed for wheezing or shortness of breath.   aspirin  EC 81 MG tablet Take 81 mg by mouth daily.   atorvastatin  (LIPITOR ) 40 MG tablet Take 1 tablet (40 mg total) by mouth daily.   budesonide -glycopyrrolate -formoterol  (BREZTRI  AEROSPHERE) 160-9-4.8 MCG/ACT AERO inhaler Inhale 2 puffs into the lungs in the morning and at bedtime.   furosemide  (LASIX ) 40 MG tablet Take 1 tablet (40 mg total) by mouth daily.   guaiFENesin  (MUCINEX ) 600 MG 12 hr tablet Take 2 tablets  (1,200 mg total) by mouth 2 (two) times daily as needed for cough or to loosen phlegm.   Magnesium  250 MG CAPS Take by mouth daily.   Multiple Vitamin (MULTIVITAMIN WITH MINERALS) TABS tablet Take 1 tablet by mouth daily.   [EXPIRED] Na Sulfate-K Sulfate-Mg Sulfate concentrate (SUPREP) 17.5-3.13-1.6 GM/177ML SOLN Take 1 kit (354 mLs total) by mouth once for 1 dose.   nitroGLYCERIN  (NITROSTAT ) 0.4 MG SL tablet Place 1 tablet (0.4 mg total) under the tongue every 5 (five) minutes as needed for chest pain.   OXYGEN  Inhale 2-3 L into the lungs.     Family History   Family History  Problem Relation Age of Onset   Brain cancer Mother    Heart disease Father      Social History   Social History   Tobacco Use   Smoking status: Former    Current packs/day: 0.00    Average packs/day: 2.0 packs/day for 51.6 years (103.1 ttl pk-yrs)    Types: Cigarettes    Start date: 89    Quit date: 04/18/2020    Years since quitting: 4.0   Smokeless tobacco: Never   Tobacco comments:    2 plus packs per day. He has a 100 +  pack - year history of tobacco abuse currently. Former 4 ppd for 25 years.  Vaping Use   Vaping status: Never Used  Substance Use Topics   Alcohol use: Yes    Alcohol/week: 144.0 standard drinks of alcohol    Types: 144 Cans of beer per week    Comment:  history 12 pack of beer per night.  2021- drinks 3 beer/day   Drug use: No   John [name] reports that he quit smoking about 4 years ago. His smoking use included cigarettes. He started smoking about 55 years ago. He has a 103.1 pack-year smoking history. He has never used smokeless tobacco. He reports current alcohol use of about 144.0 standard drinks of alcohol per week. He reports that he does not use drugs.  Vital Signs and Physical Examination   Vitals:   04/28/24 1315  BP: 118/66  Pulse: (!) 101  SpO2: 93%   Body mass index is 22.35 kg/m. Weight: 151 lb 6 oz (68.7 kg)  General: Chronically ill appearing gentleman,  dyspneic wearing oxygen , sitting in wheelchair - on 3L O2 Head: Normocephalic and atraumatic Eyes: Sclerae anicteric, EOMI Lungs: Prolonged expiratory phase on auscultation, scant wheezes in upper lung segments bilaterally Heart: Tachycardic; No murmurs, rubs or bruits Abdomen: Soft, non tender and non distended. No masses, hepatosplenomegaly or hernias noted. Normal Bowel sounds Rectal: Furred Musculoskeletal: Thin extremities  Review of Data  The following data was reviewed at the time of this encounter:  Laboratory Studies      Latest Ref Rng & Units 10/21/2023    1:41 PM 10/14/2022    1:47 PM 07/29/2022    4:11 AM  CBC  WBC 3.4 - 10.8 x10E3/uL 8.3  6.6  9.4   Hemoglobin 13.0 - 17.7 g/dL 87.0  88.1  8.2   Hematocrit 37.5 - 51.0 % 39.5  37.5  25.2   Platelets 150 - 450 x10E3/uL 198  245  187     Lab Results  Component Value Date   LIPASE 30 07/27/2022      Latest Ref Rng & Units 10/21/2023    1:41 PM 10/14/2022    1:47 PM 07/30/2022    4:14 AM  CMP  Glucose 70 - 99 mg/dL 898  95  96   BUN 8 - 27 mg/dL 17  16  31    Creatinine 0.76 - 1.27 mg/dL 8.87  8.89  8.39   Sodium 134 - 144 mmol/L 145  142  130   Potassium 3.5 - 5.2 mmol/L 4.5  4.6  4.5   Chloride 96 - 106 mmol/L 96  97  86   CO2 20 - 29 mmol/L 38  34  34   Calcium  8.6 - 10.2 mg/dL 9.5  9.4  8.6   Total Protein 6.0 - 8.5 g/dL 6.5  6.3    Total Bilirubin 0.0 - 1.2 mg/dL 0.3  0.2    Alkaline Phos 44 - 121 IU/L 154  147    AST 0 - 40 IU/L 30  29    ALT 0 - 44 IU/L 24  23       Imaging Studies  NM PET Scan 03/26/2024 Nodular areas seen in the lungs on the previous CT scan are again identified. The right basilar focus appears smaller and the left is similar. Neither of these areas show abnormal uptake. Recommend simple CT surveillance.   However there are 2 areas of abnormal uptake along the colon with masslike wall thickening. One along the proximal transverse  colon and 1 along the distal sigmoid colon.  Aggressive or neoplastic lesions are in the differential and recommend dedicated colonic evaluation when appropriate if there is no known history.   There is a third area of asymmetric uptake along the very proximal sigmoid colon where as wall thickening and spiculation which was seen on the prior examination 2023. This has less intense uptake than the other areas of colonic uptake and could be sequela of diverticulitis. Please correlate with symptoms and history.   Aortic endograft in place. The size of the aneurysm is markedly decreased from the study of 2023. Dedicated endograft protocol could be performed as and when clinically appropriate.   Advanced emphysematous lung changes.  CT Angio 08/19/2022 Interval EVAR for repair of ruptured infrarenal abdominal aortic aneurysm. No evidence of endoleak or other complicating features.   Resolved left-sided hydronephrosis   Stomach/Bowel:   - Stomach: Unremarkable.   - Small bowel: Unremarkable   - Appendix: Appendix is not visualized, however, no inflammatory changes are present adjacent to the cecum to indicate an appendicitis.   - Colon: Colonic diverticula. No acute inflammatory changes. No significant stool burden. No transition point.  CTAP 07/28/2022 Ruptured 10.2 cm infrarenal abdominal aortic aneurysm, with mild retroperitoneal hemorrhage.   Mild left hydronephrosis due to retroperitoneal hemorrhage from ruptured abdominal aortic aneurysm.   Colonic diverticulosis, without radiographic evidence of diverticulitis.   Mildly enlarged prostate.  GI Procedures and Studies  None    Clinical Impression  It is my clinical impression that John [name] is a 68 y.o. male with;  Abnormal PET scan-colon wall thickening, possible colon mass Positive Cologuard test 04/22/2022 Rectal bleeding Left lower quadrant abdominal pain IDA  John [name] presents to the office today to discuss performing colonoscopy for concern  of colonic neoplasm.  He underwent recent PET scan July 2025 to follow-up lung nodules which incidentally showed 2 areas of abnormal uptake along the colon with masslike thickening in the proximal transverse colon as well as the distal sigmoid colon.  Noteworthy that he did have a positive Cologuard in July 2023 and was advised to have a follow-up colonoscopy which he deferred.  No prior history of colonoscopy.  He endorses left lower quadrant abdominal pain that is improved since his AAA repair and 1 isolated episode of rectal bleeding.  Labs are noteworthy for iron deficiency anemia.  At today's visit, we discussed the importance of proceeding with colonoscopy due to concern for colonic neoplasm.  He understandably has many concerns about undergoing anesthesia sedation in the setting of his severe COPD as well as prior cardiac history of NSTEMI and AAA.  From a respiratory standpoint he requires 3 to 4 L of oxygen  at baseline.  He is mildly dyspneic in the office today at rest wearing oxygen  and sitting in a wheelchair.  He has not had recent chest pain or concern for angina.  Last echocardiogram in 2021 showed EF 65 to 70%.  Review of anesthesia record from his AAA repair in 2023 shows that he tolerated the procedure under MAC/fentanyl  and Versed ..  Discussed at length today that we acknowledge he has high risk medical issues for sedation and that we would involve his pulmonary and cardiology teams for recommendations to maintain his safety while undergoing future procedure.  Plan  Schedule colonoscopy at hospital-based unit Obtain preoperative pulmonary pulmonary recommendations - Dr. Harden Staff Obtain preoperative cardiology recommendations - Dr. Lonni Cash  Planned Follow Up PRN  The patient or caregiver verbalized understanding of  the material covered, with no barriers to understanding. All questions were answered. Patient or caregiver is agreeable with the plan outlined above.    It  was a pleasure to see John [name].  If you have any questions or concerns regarding this evaluation, do not hesitate to contact me.  Inocente Hausen, MD [facility]   I spent total of 45 minutes in both face-to-face (25 minutes interview) and non-face-to-face (20 minutes chart review, care coordination, documentation)  activities, excluding procedures performed, for the visit on the date of this encounter.

## 2024-04-28 ENCOUNTER — Telehealth: Payer: Self-pay

## 2024-04-28 ENCOUNTER — Ambulatory Visit: Admitting: Pediatrics

## 2024-04-28 ENCOUNTER — Encounter: Payer: Self-pay | Admitting: Pediatrics

## 2024-04-28 ENCOUNTER — Encounter (HOSPITAL_COMMUNITY)

## 2024-04-28 DIAGNOSIS — R933 Abnormal findings on diagnostic imaging of other parts of digestive tract: Secondary | ICD-10-CM

## 2024-04-28 DIAGNOSIS — R195 Other fecal abnormalities: Secondary | ICD-10-CM | POA: Diagnosis not present

## 2024-04-28 DIAGNOSIS — D509 Iron deficiency anemia, unspecified: Secondary | ICD-10-CM

## 2024-04-28 DIAGNOSIS — R948 Abnormal results of function studies of other organs and systems: Secondary | ICD-10-CM

## 2024-04-28 DIAGNOSIS — R1032 Left lower quadrant pain: Secondary | ICD-10-CM

## 2024-04-28 DIAGNOSIS — K625 Hemorrhage of anus and rectum: Secondary | ICD-10-CM

## 2024-04-28 MED ORDER — NA SULFATE-K SULFATE-MG SULF 17.5-3.13-1.6 GM/177ML PO SOLN: 1.0000 | Freq: Once | ORAL | 0 refills | Status: AC

## 2024-04-28 NOTE — Telephone Encounter (Signed)
 [facility] Pre-operative Risk Assessment     Request for surgical clearance:     Endoscopy Procedure  What type of surgery is being performed?     Colonoscopy  When is this surgery scheduled?     05-25-24  What type of clearance is required ?   Medical Clearance  Practice name and name of physician performing surgery?      [facility]  What is your office phone and fax number?      Phone- [phone]  Fax- [phone]  Anesthesia type (None, local, MAC, general) ?       MAC   Please route your response to Nat SAUNDERS, CMA

## 2024-04-28 NOTE — Telephone Encounter (Signed)
 Preop tele appt now scheduled, med rec and consent done

## 2024-04-28 NOTE — Telephone Encounter (Signed)
  John [name] August 08, 1956 [id number]  04-28-24   Dear Dr Jude,   We have scheduled the above named patient for a colonoscopy procedure. Our records show that he is under your pulmonary care for COPD, emphysema, and chronic respiratory failure.  Please advise as to whether the patient is cleared from a pulmonary standpoint prior to their procedure which is scheduled for 05-25-24 at [facility]  Please route your response to Nat Sic, CMA or fax response to [phone].  Sincerely,    [facility]

## 2024-04-28 NOTE — Telephone Encounter (Signed)
  Patient Consent for Virtual Visit        John [name] has provided verbal consent on 04/28/2024 for a virtual visit (video or telephone).   CONSENT FOR VIRTUAL VISIT FOR:  John [name]  By participating in this virtual visit I agree to the following:  I hereby voluntarily request, consent and authorize [facility] and its employed or contracted physicians, physician assistants, nurse practitioners or other licensed health care professionals (the Practitioner), to provide me with telemedicine health care services (the "Services) as deemed necessary by the treating Practitioner. I acknowledge and consent to receive the Services by the Practitioner via telemedicine. I understand that the telemedicine visit will involve communicating with the Practitioner through live audiovisual communication technology and the disclosure of certain medical information by electronic transmission. I acknowledge that I have been given the opportunity to request an in-person assessment or other available alternative prior to the telemedicine visit and am voluntarily participating in the telemedicine visit.  I understand that I have the right to withhold or withdraw my consent to the use of telemedicine in the course of my care at any time, without affecting my right to future care or treatment, and that the Practitioner or I may terminate the telemedicine visit at any time. I understand that I have the right to inspect all information obtained and/or recorded in the course of the telemedicine visit and may receive copies of available information for a reasonable fee.  I understand that some of the potential risks of receiving the Services via telemedicine include:  Delay or interruption in medical evaluation due to technological equipment failure or disruption; Information transmitted may not be sufficient (e.g. poor resolution of images) to allow for appropriate medical decision making by the  Practitioner; and/or  In rare instances, security protocols could fail, causing a breach of personal health information.  Furthermore, I acknowledge that it is my responsibility to provide information about my medical history, conditions and care that is complete and accurate to the best of my ability. I acknowledge that Practitioner's advice, recommendations, and/or decision may be based on factors not within their control, such as incomplete or inaccurate data provided by me or distortions of diagnostic images or specimens that may result from electronic transmissions. I understand that the practice of medicine is not an exact science and that Practitioner makes no warranties or guarantees regarding treatment outcomes. I acknowledge that a copy of this consent can be made available to me via my patient portal [facility] MyChart), or I can request a printed copy by calling the office of [facility].    I understand that my insurance will be billed for this visit.   I have read or had this consent read to me. I understand the contents of this consent, which adequately explains the benefits and risks of the Services being provided via telemedicine.  I have been provided ample opportunity to ask questions regarding this consent and the Services and have had my questions answered to my satisfaction. I give my informed consent for the services to be provided through the use of telemedicine in my medical care

## 2024-04-28 NOTE — Telephone Encounter (Signed)
   Name: [name]  DOB: 03-01-1956  MRN: [id number]  Primary Cardiologist: Lonni Cash, MD   Preoperative team, please contact this patient and set up a phone call appointment for further preoperative risk assessment. Please obtain consent and complete medication review. Has appt on 06/03/2024 but colonoscopy is scheduled for 05/25/2024. Thank you for your help.  I confirm that guidance regarding antiplatelet and oral anticoagulation therapy has been completed and, if necessary, noted below.  Per office protocol, if patient is without any new symptoms or concerns at the time of their virtual visit, he may hold aspirin  for 7 days prior to procedure. Please resume aspirin  as soon as possible postprocedure, at the discretion of the surgeon.    I also confirmed the patient resides in the state of [place] . As per [facility] telemedicine laws, the patient must reside in the state in which the provider is licensed.   Lamarr Satterfield, NP 04/28/2024, 2:42 PM [facility]

## 2024-04-28 NOTE — Patient Instructions (Addendum)
 _______________________________________________________  If your blood pressure at your visit was 140/90 or greater, please contact your primary care physician to follow up on this. _______________________________________________________  If you are age 67 or older, your body mass index should be between 23-30. Your Body mass index is 22.35 kg/m. If this is out of the aforementioned range listed, please consider follow up with your Primary Care Provider. ________________________________________________________  The [facility] providers would like to encourage you to use MYCHART to communicate with providers for non-urgent requests or questions.  Due to long hold times on the telephone, sending your provider a message by [facility] may be a faster and more efficient way to get a response.  Please allow 48 business hours for a response.  Please remember that this is for non-urgent requests.  _______________________________________________________  [facility] is using a team-based approach to care.  Your team is made up of your doctor and two to three APPS. Our APPS (Nurse Practitioners and Physician Assistants) work with your physician to ensure care continuity for you. They are fully qualified to address your health concerns and develop a treatment plan. They communicate directly with your gastroenterologist to care for you. Seeing the Advanced Practice Practitioners on your physician's team can help you by facilitating care more promptly, often allowing for earlier appointments, access to diagnostic testing, procedures, and other specialty referrals.   You have been scheduled for a colonoscopy. Please follow written instructions given to you at your visit today.   If you use inhalers (even only as needed), please bring them with you on the day of your procedure.  DO NOT TAKE 7 DAYS PRIOR TO TEST- Trulicity (dulaglutide) Ozempic, Wegovy (semaglutide) Mounjaro (tirzepatide) Bydureon  Bcise (exanatide extended release)  DO NOT TAKE 1 DAY PRIOR TO YOUR TEST Rybelsus (semaglutide) Adlyxin (lixisenatide) Victoza (liraglutide) Byetta (exanatide) ___________________________________________________________________________  Due to recent changes in healthcare laws, you may see the results of your imaging and laboratory studies on MyChart before your provider has had a chance to review them.  We understand that in some cases there may be results that are confusing or concerning to you. Not all laboratory results come back in the same time frame and the provider may be waiting for multiple results in order to interpret others.  Please give us  48 hours in order for your provider to thoroughly review all the results before contacting the office for clarification of your results.   Thank you for entrusting me with your care and choosing [facility].  Dr Suzann

## 2024-05-01 ENCOUNTER — Encounter: Payer: Self-pay | Admitting: Pediatrics

## 2024-05-03 ENCOUNTER — Encounter (HOSPITAL_COMMUNITY)

## 2024-05-04 ENCOUNTER — Telehealth (HOSPITAL_BASED_OUTPATIENT_CLINIC_OR_DEPARTMENT_OTHER): Payer: Self-pay | Admitting: Pulmonary Disease

## 2024-05-04 NOTE — Telephone Encounter (Signed)
 Copied from CRM [id number]. Topic: General - Other >> May 04, 2024 12:02 PM Corean SAUNDERS wrote: Reason for CRM: Patients girlfriend [facility]) is requesting an update on the surgical clearance request that was sent over by the patients gastroenterologist as patient has an upcoming colonoscopy on 9/2  Sonny, I havent seen a request come to [facility] for this patient. His LOV was 01/26/24 with RA. Please advise.

## 2024-05-05 ENCOUNTER — Encounter (HOSPITAL_COMMUNITY): Payer: Self-pay | Admitting: *Deleted

## 2024-05-05 ENCOUNTER — Encounter (HOSPITAL_COMMUNITY)
Admission: RE | Admit: 2024-05-05 | Discharge: 2024-05-05 | Disposition: A | Discharge status: Home / Self Care | Source: Ambulatory Visit | Attending: Pulmonary Disease | Admitting: Pulmonary Disease

## 2024-05-05 DIAGNOSIS — J439 Emphysema, unspecified: Secondary | ICD-10-CM

## 2024-05-05 DIAGNOSIS — J4489 Other specified chronic obstructive pulmonary disease: Secondary | ICD-10-CM | POA: Diagnosis not present

## 2024-05-05 NOTE — Progress Notes (Signed)
 Pulmonary Individual Treatment Plan  Patient Details  Name: John [name] MRN: [id number] Date of Birth: 05/19/1956 Referring Provider:   Flowsheet Row PULMONARY REHAB COPD ORIENTATION from 02/02/2024 in [facility] CARDIAC REHABILITATION  Referring Provider [name] MD    Initial Encounter Date:  Flowsheet Row PULMONARY REHAB COPD ORIENTATION from 02/02/2024 in [place] RE[facility]  Date 02/02/24    Visit Diagnosis: Pulmonary emphysema, unspecified emphysema type (HCC)  Chronic bronchitis with COPD (chronic obstructive pulmonary disease) (HCC)  Patient's Home Medications on Admission:   Current Outpatient Medications:    albuterol  (PROAIR  HFA) 108 (90 Base) MCG/ACT inhaler, Inhale 2 puffs into the lungs every 4 (four) hours as needed for wheezing or shortness of breath. 2 puffs every 4 hours as needed only  if your can't catch your breath, Disp: 8 g, Rfl: 5   albuterol  (PROVENTIL ) (2.5 MG/3ML) 0.083% nebulizer solution, Take 3 mLs (2.5 mg total) by nebulization every 4 (four) hours as needed for wheezing or shortness of breath., Disp: 360 mL, Rfl: 5   aspirin  EC 81 MG tablet, Take 81 mg by mouth daily., Disp: , Rfl:    atorvastatin  (LIPITOR ) 40 MG tablet, Take 1 tablet (40 mg total) by mouth daily., Disp: 90 tablet, Rfl: 3   budesonide -glycopyrrolate -formoterol  (BREZTRI  AEROSPHERE) 160-9-4.8 MCG/ACT AERO inhaler, Inhale 2 puffs into the lungs in the morning and at bedtime., Disp: 10.7 g, Rfl: 11   furosemide  (LASIX ) 40 MG tablet, Take 1 tablet (40 mg total) by mouth daily., Disp: 90 tablet, Rfl: 2   guaiFENesin  (MUCINEX ) 600 MG 12 hr tablet, Take 2 tablets (1,200 mg total) by mouth 2 (two) times daily as needed for cough or to loosen phlegm., Disp: , Rfl:    Magnesium  250 MG CAPS, Take by mouth daily., Disp: , Rfl:    Multiple Vitamin (MULTIVITAMIN WITH MINERALS) TABS tablet, Take 1 tablet by mouth daily., Disp: , Rfl:    nitroGLYCERIN  (NITROSTAT ) 0.4 MG SL tablet,  Place 1 tablet (0.4 mg total) under the tongue every 5 (five) minutes as needed for chest pain., Disp: 25 tablet, Rfl: 6   OXYGEN , Inhale 2-3 L into the lungs., Disp: , Rfl:   Past Medical History: Past Medical History:  Diagnosis Date   Alcohol abuse 12/27/2010   Allergic rhinitis, cause unspecified 12/27/2010   Anal warts 04/11/2012   CAD, NATIVE VESSEL 10/04/2010   COPD (chronic obstructive pulmonary disease) (HCC)    a touch (09/28/2013)   History of blood transfusion 03/2012   related to nose bleed   HYPERLIPIDEMIA-MIXED 10/04/2010   HYPERTENSION, BENIGN 10/04/2010   Myocardial infarction (HCC) 09/16/2010   Pneumonia    when I was a kid   Stented coronary artery    Mid LAD    TOBACCO ABUSE 10/04/2010    Tobacco Use: Social History   Tobacco Use  Smoking Status Former   Current packs/day: 0.00   Average packs/day: 2.0 packs/day for 51.6 years (103.1 ttl pk-yrs)   Types: Cigarettes   Start date: 72   Quit date: 04/18/2020   Years since quitting: 4.0  Smokeless Tobacco Never  Tobacco Comments   2 plus packs per day. He has a 100 + pack - year history of tobacco abuse currently. Former 4 ppd for 25 years.    Labs: Review Flowsheet  More data exists      Latest Ref Rng & Units 02/12/2021 03/22/2022 07/28/2022 10/14/2022 10/21/2023  Labs for ITP Cardiac and Pulmonary Rehab  Cholestrol 100 - 199  mg/dL - 833  - 840  821   LDL (calc) 0 - 99 mg/dL - 76  - 72  81   HDL-C >39 mg/dL - 77  - 76  86   Trlycerides 0 - 149 mg/dL - 70  - 55  57   PH, Arterial 7.35 - 7.45 7.283  - 7.265  - -  PCO2 arterial 32 - 48 mmHg 80.9  - 80.0  - -  Bicarbonate 20.0 - 28.0 mmol/L 31.4  - 36.7  - -  TCO2 22 - 32 mmol/L - - 39  - -  O2 Saturation % 97.2  - 97  - -    Capillary Blood Glucose: Lab Results  Component Value Date   GLUCAP 185 (H) 04/25/2020   GLUCAP 80 04/25/2020   GLUCAP 80 04/25/2020   GLUCAP 131 (H) 04/24/2020   GLUCAP 176 (H) 04/24/2020     Pulmonary Assessment Scores:   Pulmonary Assessment Scores     Row Name 02/02/24 1640         ADL UCSD   ADL Phase Entry     SOB Score total 60     Rest 0     Walk 3     Stairs 4     Bath 4     Dress 2     Shop 3       CAT Score   CAT Score 17       mMRC Score   mMRC Score 4       UCSD: Self-administered rating of dyspnea associated with activities of daily living (ADLs) 6-point scale (0 = not at all to 5 = maximal or unable to do because of breathlessness)  Scoring Scores range from 0 to 120.  Minimally important difference is 5 units  CAT: CAT can identify the health impairment of COPD patients and is better correlated with disease progression.  CAT has a scoring range of zero to 40. The CAT score is classified into four groups of low (less than 10), medium (10 - 20), high (21-30) and very high (31-40) based on the impact level of disease on health status. A CAT score over 10 suggests significant symptoms.  A worsening CAT score could be explained by an exacerbation, poor medication adherence, poor inhaler technique, or progression of COPD or comorbid conditions.  CAT MCID is 2 points  mMRC: mMRC (Modified Medical Research Council) Dyspnea Scale is used to assess the degree of baseline functional disability in patients of respiratory disease due to dyspnea. No minimal important difference is established. A decrease in score of 1 point or greater is considered a positive change.   Pulmonary Function Assessment:  Pulmonary Function Assessment - 02/02/24 1643       Initial Spirometry Results   FVC% 34 %    FEV1% 58 %    FEV1/FVC Ratio 35    Comments test 12/09/19      Post Bronchodilator Spirometry Results   FVC% 33 %    FEV1% 59 %    FEV1/FVC Ratio 37    Comments test 12/09/19      Breath   Shortness of Breath Yes;Fear of Shortness of Breath;Limiting activity;Panic with Shortness of Breath          Exercise Target Goals: Exercise Program Goal: Individual exercise prescription set  using results from initial 6 min walk test and THRR while considering  patient's activity barriers and safety.   Exercise Prescription Goal: Initial exercise prescription builds to  30-45 minutes a day of aerobic activity, 2-3 days per week.  Home exercise guidelines will be given to patient during program as part of exercise prescription that the participant will acknowledge.  Activity Barriers & Risk Stratification:  Activity Barriers & Cardiac Risk Stratification - 01/30/24 0924       Activity Barriers & Cardiac Risk Stratification   Activity Barriers Deconditioning;Muscular Weakness;Shortness of Breath          6 Minute Walk:  6 Minute Walk     Row Name 02/02/24 1630         6 Minute Walk   Phase Initial     Distance 300 feet     Walk Time 3.33 minutes     # of Rest Breaks 5  21 sec, 36 sec, 20 sec, 53 sec, 30 sec (all standing rest)     MPH 1.02     METS 2.08     RPE 15     Perceived Dyspnea  3     VO2 Peak 7.3     Symptoms Yes (comment)     Comments SOB, chest tightness 6/10 hard to breathe     Resting HR 98 bpm     Resting BP 126/74     Resting Oxygen  Saturation  98 %     Exercise Oxygen  Saturation  during 6 min walk 93 %     Max Ex. HR 125 bpm     Max Ex. BP 142/84     2 Minute Post BP 132/64       Interval HR   1 Minute HR 125     2 Minute HR 117     3 Minute HR 109     4 Minute HR 114     5 Minute HR 110     6 Minute HR 112     2 Minute Post HR 105     Interval Heart Rate? Yes       Interval Oxygen    Interval Oxygen ? Yes     Baseline Oxygen  Saturation % 98 %  3L NCC     1 Minute Oxygen  Saturation % 95 %     1 Minute Liters of Oxygen  4 L  NCC     2 Minute Oxygen  Saturation % 97 %     2 Minute Liters of Oxygen  4 L     3 Minute Oxygen  Saturation % 95 %     3 Minute Liters of Oxygen  4 L     4 Minute Oxygen  Saturation % 97 %     4 Minute Liters of Oxygen  4 L     5 Minute Oxygen  Saturation % 98 %     5 Minute Liters of Oxygen  4 L     6 Minute  Oxygen  Saturation % 93 %     6 Minute Liters of Oxygen  4 L     2 Minute Post Oxygen  Saturation % 97 %     2 Minute Post Liters of Oxygen  4 L        Oxygen  Initial Assessment:  Oxygen  Initial Assessment - 01/30/24 0921       Home Oxygen    Home Oxygen  Device Home Concentrator;E-Tanks;Portable Concentrator    Sleep Oxygen  Prescription Continuous   ventilator machine for 5 hrs a day   Liters per minute 3    Home Exercise Oxygen  Prescription Continuous    Liters per minute 3   4 liters for shower and walking outside (up hill)  Home Resting Oxygen  Prescription Continuous    Liters per minute 3    Compliance with Home Oxygen  Use Yes   usually compliant     Initial 6 min Walk   Oxygen  Used E-Tanks;Continuous    Liters per minute 4      Program Oxygen  Prescription   Program Oxygen  Prescription E-Tanks;Continuous    Liters per minute 4      Intervention   Short Term Goals To learn and exhibit compliance with exercise, home and travel O2 prescription;To learn and understand importance of monitoring SPO2 with pulse oximeter and demonstrate accurate use of the pulse oximeter.;To learn and understand importance of maintaining oxygen  saturations>88%;To learn and demonstrate proper pursed lip breathing techniques or other breathing techniques. ;To learn and demonstrate proper use of respiratory medications    Long  Term Goals Exhibits compliance with exercise, home  and travel O2 prescription;Maintenance of O2 saturations>88%;Compliance with respiratory medication;Verbalizes importance of monitoring SPO2 with pulse oximeter and return demonstration;Exhibits proper breathing techniques, such as pursed lip breathing or other method taught during program session;Demonstrates proper use of MDI's          Oxygen  Re-Evaluation:  Oxygen  Re-Evaluation     Row Name 03/29/24 0936 04/12/24 1152           Program Oxygen  Prescription   Program Oxygen  Prescription E-Tanks;Continuous  E-Tanks;Continuous      Liters per minute 3 3        Home Oxygen    Home Oxygen  Device Home Concentrator;E-Tanks;Portable Concentrator Home Concentrator;E-Tanks;Portable Concentrator      Sleep Oxygen  Prescription -- Continuous      Liters per minute 3 3      Home Exercise Oxygen  Prescription Continuous Continuous      Liters per minute 3 3      Home Resting Oxygen  Prescription Continuous Continuous      Liters per minute 3 3      Compliance with Home Oxygen  Use Yes Yes        Goals/Expected Outcomes   Short Term Goals To learn and exhibit compliance with exercise, home and travel O2 prescription;To learn and understand importance of monitoring SPO2 with pulse oximeter and demonstrate accurate use of the pulse oximeter.;To learn and understand importance of maintaining oxygen  saturations>88%;To learn and demonstrate proper pursed lip breathing techniques or other breathing techniques. ;To learn and demonstrate proper use of respiratory medications --      Long  Term Goals Exhibits compliance with exercise, home  and travel O2 prescription;Maintenance of O2 saturations>88%;Compliance with respiratory medication;Verbalizes importance of monitoring SPO2 with pulse oximeter and return demonstration;Exhibits proper breathing techniques, such as pursed lip breathing or other method taught during program session;Demonstrates proper use of MDI's --      Comments Rick's breathing has been about the same since last goal check-in. He states with the heat it has been much more difficult and he doesn't go outside when it's hot. He is compliant with his oxygen  at 3L all the time. He takes all his respiratory medications as prescribed. He states when he gets SOB a lot of times he will just have to sit still and wait for his HR to go down. Encouraged to use the PLB to help also. Rick's breathing continues to be the same since the last goal check in. He stated that he has good days and bad days. The heat/humidity has  made it difficult the past month and only goes outside if he has to. He is compliant with 3L  and does his PLB when he feels SOB.      Goals/Expected Outcomes Short: Continue to attend rehab. Long: Use PLB to help with SOB. Short: Continue to attend rehab. Long: Use PLB to help with SOB.         Oxygen  Discharge (Final Oxygen  Re-Evaluation):  Oxygen  Re-Evaluation - 04/12/24 1152       Program Oxygen  Prescription   Program Oxygen  Prescription E-Tanks;Continuous    Liters per minute 3      Home Oxygen    Home Oxygen  Device Home Concentrator;E-Tanks;Portable Concentrator    Sleep Oxygen  Prescription Continuous    Liters per minute 3    Home Exercise Oxygen  Prescription Continuous    Liters per minute 3    Home Resting Oxygen  Prescription Continuous    Liters per minute 3    Compliance with Home Oxygen  Use Yes      Goals/Expected Outcomes   Comments Rick's breathing continues to be the same since the last goal check in. He stated that he has good days and bad days. The heat/humidity has made it difficult the past month and only goes outside if he has to. He is compliant with 3L and does his PLB when he feels SOB.    Goals/Expected Outcomes Short: Continue to attend rehab. Long: Use PLB to help with SOB.          Initial Exercise Prescription:  Initial Exercise Prescription - 02/02/24 1600       Date of Initial Exercise RX and Referring Provider   Date 02/02/24    Referring Provider [name] MD      Oxygen    Oxygen  Continuous    Liters 4    Maintain Oxygen  Saturation 88% or higher      NuStep   Level 1    SPM 60    Minutes 30    METs 1.2      Track   Laps 2    Minutes 5    METs 1      Prescription Details   Frequency (times per week) 2    Duration Progress to 30 minutes of continuous aerobic without signs/symptoms of physical distress      Intensity   THRR 40-80% of Max Heartrate 120-142    Ratings of Perceived Exertion 11-13    Perceived Dyspnea 0-4       Progression   Progression Continue to progress workloads to maintain intensity without signs/symptoms of physical distress.      Resistance Training   Training Prescription Yes    Weight 3 lb    Reps 10-15          Perform Capillary Blood Glucose checks as needed.  Exercise Prescription Changes:   Exercise Prescription Changes     Row Name 02/02/24 1600 02/04/24 1300 02/18/24 1300 03/01/24 1500 03/29/24 1500     Response to Exercise   Blood Pressure (Admit) 126/74 128/58 110/60 128/74 126/64   Blood Pressure (Exercise) 142/84 134/70 120/60 130/60 --   Blood Pressure (Exit) 132/72 120/62 112/66 134/60 108/64   Heart Rate (Admit) 98 bpm 106 bpm 110 bpm 120 bpm 121 bpm   Heart Rate (Exercise) 125 bpm 106 bpm 116 bpm 124 bpm 109 bpm   Heart Rate (Exit) 104 bpm 100 bpm 116 bpm 120 bpm 111 bpm   Oxygen  Saturation (Admit) 98 % 94 % 91 % 90 % 91 %   Oxygen  Saturation (Exercise) 93 % 100 % 96 % 91 % 93 %  Oxygen  Saturation (Exit) 97 % 98 % 94 % 93 % 95 %   Rating of Perceived Exertion (Exercise) 15 15 14 15 13    Perceived Dyspnea (Exercise) 3 2 3 3 3    Symptoms standing rest breaks, SOB, chest tightness 6/10 (normal) -- -- -- --   Comments walk test results -- -- -- --   Duration -- Continue with 30 min of aerobic exercise without signs/symptoms of physical distress. Continue with 30 min of aerobic exercise without signs/symptoms of physical distress. Continue with 30 min of aerobic exercise without signs/symptoms of physical distress. Continue with 30 min of aerobic exercise without signs/symptoms of physical distress.   Intensity -- THRR unchanged THRR unchanged THRR unchanged THRR unchanged     Progression   Progression -- Continue to progress workloads to maintain intensity without signs/symptoms of physical distress. Continue to progress workloads to maintain intensity without signs/symptoms of physical distress. Continue to progress workloads to maintain intensity without  signs/symptoms of physical distress. Continue to progress workloads to maintain intensity without signs/symptoms of physical distress.     Resistance Training   Training Prescription -- Yes Yes Yes Yes   Weight -- 3 3 3 3    Reps -- 10-15 10-15 10-15 10-15     Oxygen    Oxygen  -- Continuous Continuous Continuous Continuous   Liters -- 4 4 3 4      NuStep   Level -- 1 1 -- 2   SPM -- 36 45 -- 43   Minutes -- 30 30 -- 30   METs -- 1.3 1.5 -- 1.5     Oxygen    Maintain Oxygen  Saturation -- 88% or higher 88% or higher -- 88% or higher    Row Name 04/12/24 1300             Response to Exercise   Blood Pressure (Admit) 130/68       Blood Pressure (Exit) 136/62       Heart Rate (Admit) 115 bpm       Heart Rate (Exercise) 121 bpm       Heart Rate (Exit) 109 bpm       Oxygen  Saturation (Admit) 90 %       Oxygen  Saturation (Exercise) 90 %       Oxygen  Saturation (Exit) 92 %       Rating of Perceived Exertion (Exercise) 14       Perceived Dyspnea (Exercise) 2       Duration Continue with 30 min of aerobic exercise without signs/symptoms of physical distress.       Intensity THRR unchanged         Progression   Progression Continue to progress workloads to maintain intensity without signs/symptoms of physical distress.         Resistance Training   Training Prescription Yes       Weight 3       Reps 10-15         Oxygen    Oxygen  Continuous       Liters 3         NuStep   Level 2       SPM 52       Minutes 30       METs 1.6         Oxygen    Maintain Oxygen  Saturation 88% or higher          Exercise Comments:   Exercise Goals and Review:   Exercise Goals  Row Name 02/02/24 1636             Exercise Goals   Increase Physical Activity Yes       Intervention Provide advice, education, support and counseling about physical activity/exercise needs.;Develop an individualized exercise prescription for aerobic and resistive training based on initial evaluation  findings, risk stratification, comorbidities and participant's personal goals.       Expected Outcomes Short Term: Attend rehab on a regular basis to increase amount of physical activity.;Long Term: Add in home exercise to make exercise part of routine and to increase amount of physical activity.;Long Term: Exercising regularly at least 3-5 days a week.       Increase Strength and Stamina Yes       Intervention Provide advice, education, support and counseling about physical activity/exercise needs.;Develop an individualized exercise prescription for aerobic and resistive training based on initial evaluation findings, risk stratification, comorbidities and participant's personal goals.       Expected Outcomes Short Term: Increase workloads from initial exercise prescription for resistance, speed, and METs.;Short Term: Perform resistance training exercises routinely during rehab and add in resistance training at home;Long Term: Improve cardiorespiratory fitness, muscular endurance and strength as measured by increased METs and functional capacity ( )       Able to understand and use rate of perceived exertion (RPE) scale Yes       Intervention Provide education and explanation on how to use RPE scale       Expected Outcomes Long Term:  Able to use RPE to guide intensity level when exercising independently;Short Term: Able to use RPE daily in rehab to express subjective intensity level       Able to understand and use Dyspnea scale Yes       Intervention Provide education and explanation on how to use Dyspnea scale       Expected Outcomes Short Term: Able to use Dyspnea scale daily in rehab to express subjective sense of shortness of breath during exertion;Long Term: Able to use Dyspnea scale to guide intensity level when exercising independently       Knowledge and understanding of Target Heart Rate Range (THRR) Yes       Intervention Provide education and explanation of THRR including how the numbers  were predicted and where they are located for reference       Expected Outcomes Short Term: Able to state/look up THRR;Short Term: Able to use daily as guideline for intensity in rehab;Long Term: Able to use THRR to govern intensity when exercising independently       Able to check pulse independently Yes       Intervention Provide education and demonstration on how to check pulse in carotid and radial arteries.;Review the importance of being able to check your own pulse for safety during independent exercise       Expected Outcomes Short Term: Able to explain why pulse checking is important during independent exercise;Long Term: Able to check pulse independently and accurately       Understanding of Exercise Prescription Yes       Intervention Provide education, explanation, and written materials on patient's individual exercise prescription       Expected Outcomes Short Term: Able to explain program exercise prescription;Long Term: Able to explain home exercise prescription to exercise independently          Exercise Goals Re-Evaluation :  Exercise Goals Re-Evaluation     Row Name 03/03/24 1151 03/29/24 0935 04/12/24 1142  Exercise Goal Re-Evaluation   Exercise Goals Review Increase Physical Activity;Increase Strength and Stamina;Understanding of Exercise Prescription Increase Strength and Stamina;Increase Physical Activity;Able to understand and use rate of perceived exertion (RPE) scale;Able to understand and use Dyspnea scale;Understanding of Exercise Prescription Increase Physical Activity;Increase Strength and Stamina;Understanding of Exercise Prescription     Comments Dick is doing well in rehab. He has not increased his level on the nustep but his SPM are increasing slightly. He is exercsing at a RPE of 13. He stated that he does feel a difference in his energy levels after exercise. We talking about energy saviing techniques when doing activities such as grocery shopping. Dick is  doing well in rehab. He states he does not do any exercise at home that this is his only exercise regimen. He is slowly increasing his SPM on the Nustep. He does have to take breaks often due to SOB. Dick in doing well in rehab. He is deconditioned but has been improving in rehab. He continues to work on his SPM . He gets very winded during exercise and focuses on hos PLB. Will continue to montior and progress as able     Expected Outcomes Short: practice energy techniques   long term: continue to attend rehab. Short: Continue to attend rehab to increase strength and stamina. Long: Incorporate exercise at home. Short: Continue to attend rehab to increase strength and stamina. Long: Incorporate exercise at home.        Discharge Exercise Prescription (Final Exercise Prescription Changes):  Exercise Prescription Changes - 04/12/24 1300       Response to Exercise   Blood Pressure (Admit) 130/68    Blood Pressure (Exit) 136/62    Heart Rate (Admit) 115 bpm    Heart Rate (Exercise) 121 bpm    Heart Rate (Exit) 109 bpm    Oxygen  Saturation (Admit) 90 %    Oxygen  Saturation (Exercise) 90 %    Oxygen  Saturation (Exit) 92 %    Rating of Perceived Exertion (Exercise) 14    Perceived Dyspnea (Exercise) 2    Duration Continue with 30 min of aerobic exercise without signs/symptoms of physical distress.    Intensity THRR unchanged      Progression   Progression Continue to progress workloads to maintain intensity without signs/symptoms of physical distress.      Resistance Training   Training Prescription Yes    Weight 3    Reps 10-15      Oxygen    Oxygen  Continuous    Liters 3      NuStep   Level 2    SPM 52    Minutes 30    METs 1.6      Oxygen    Maintain Oxygen  Saturation 88% or higher          Nutrition:  Target Goals: Understanding of nutrition guidelines, daily intake of sodium 1500mg , cholesterol 200mg , calories 30% from fat and 7% or less from saturated fats, daily to  have 5 or more servings of fruits and vegetables.  Biometrics:  Pre Biometrics - 02/02/24 1637       Pre Biometrics   Height 5' 9 (1.753 m)    Weight 149 lb 9.6 oz (67.9 kg)    Waist Circumference 34 inches    Hip Circumference 37.5 inches    Waist to Hip Ratio 0.91 %    BMI (Calculated) 22.08    Grip Strength 37.3 kg    Single Leg Stand 20.9 seconds  Nutrition Therapy Plan and Nutrition Goals:  Nutrition Therapy & Goals - 01/30/24 1007       Intervention Plan   Intervention Prescribe, educate and counsel regarding individualized specific dietary modifications aiming towards targeted core components such as weight, hypertension, lipid management, diabetes, heart failure and other comorbidities.;Nutrition handout(s) given to patient.    Expected Outcomes Short Term Goal: Understand basic principles of dietary content, such as calories, fat, sodium, cholesterol and nutrients.;Long Term Goal: Adherence to prescribed nutrition plan.          Nutrition Assessments:  MEDIFICTS Score Key: >=70 Need to make dietary changes  40-70 Heart Healthy Diet <= 40 Therapeutic Level Cholesterol Diet  Flowsheet Row PULMONARY REHAB COPD ORIENTATION from 02/02/2024 in [facility] CARDIAC REHABILITATION  Picture Your Plate Total Score on Admission 50   Picture Your Plate Scores: <59 Unhealthy dietary pattern with much room for improvement. 41-50 Dietary pattern unlikely to meet recommendations for good health and room for improvement. 51-60 More healthful dietary pattern, with some room for improvement.  >60 Healthy dietary pattern, although there may be some specific behaviors that could be improved.    Nutrition Goals Re-Evaluation:  Nutrition Goals Re-Evaluation     Row Name 03/03/24 1321 03/29/24 0932 04/12/24 1147         Goals   Nutrition Goal Healthy eating Healthy eating. Healthy eating.     Comment Dick is doing well in rehab. He stated that he is eating wha he  likes to eat and really only eats about two meals a day. He does not drink a lot of water and we talked about increasing his water intake. He does drink diet sodas throughtout the day. Dick states he is trying to eat healthy for the most part, he doesn't drink a lot of water, mostly tea. States he feels he is staying hydrated enough during the heat. Encouraged to drink more water. Dick is doing well in rehab. He states that he is trying to eat healthy and pick healthier options for meals. He continues to not drink much water but does drink sweet tea throughout the day. He has cut back on sodas. He was encourged to drink more water throughtout the day.     Expected Outcome Short: increase water intake long term: try to choose healthy options for meals Short: increase water intake. Long: try to choose healthy options for meals. Short: increase water intake. Long: try to choose healthy options for meals.        Nutrition Goals Discharge (Final Nutrition Goals Re-Evaluation):  Nutrition Goals Re-Evaluation - 04/12/24 1147       Goals   Nutrition Goal Healthy eating.    Comment Dick is doing well in rehab. He states that he is trying to eat healthy and pick healthier options for meals. He continues to not drink much water but does drink sweet tea throughout the day. He has cut back on sodas. He was encourged to drink more water throughtout the day.    Expected Outcome Short: increase water intake. Long: try to choose healthy options for meals.          Psychosocial: Target Goals: Acknowledge presence or absence of significant depression and/or stress, maximize coping skills, provide positive support system. Participant is able to verbalize types and ability to use techniques and skills needed for reducing stress and depression.  Initial Review & Psychosocial Screening:  Initial Psych Review & Screening - 01/30/24 0925       Initial Review  Current issues with Current Sleep Concerns;Current  Stress Concerns    Source of Stress Concerns Chronic Illness;Unable to participate in former interests or hobbies;Unable to perform yard/household activities    Comments chronic bad sleeper, wife wants him to use his ventilator at home regularly      Family Dynamics   Good Support System? Yes   wife, daughter     Barriers   Psychosocial barriers to participate in program The patient should benefit from training in stress management and relaxation.;Psychosocial barriers identified (see note)      Screening Interventions   Interventions Encouraged to exercise;To provide support and resources with identified psychosocial needs;Provide feedback about the scores to participant    Expected Outcomes Short Term goal: Utilizing psychosocial counselor, staff and physician to assist with identification of specific Stressors or current issues interfering with healing process. Setting desired goal for each stressor or current issue identified.;Long Term Goal: Stressors or current issues are controlled or eliminated.;Short Term goal: Identification and review with participant of any Quality of Life or Depression concerns found by scoring the questionnaire.;Long Term goal: The participant improves quality of Life and PHQ9 Scores as seen by post scores and/or verbalization of changes          Quality of Life Scores:  Scores of 19 and below usually indicate a poorer quality of life in these areas.  A difference of  2-3 points is a clinically meaningful difference.  A difference of 2-3 points in the total score of the Quality of Life Index has been associated with significant improvement in overall quality of life, self-image, physical symptoms, and general health in studies assessing change in quality of life.   PHQ-9: Review Flowsheet  More data exists      01/30/2024 09/10/2023 04/16/2023 10/14/2022 08/08/2022  Depression screen PHQ 2/9  Decreased Interest 0 0 0 0 0  Down, Depressed, Hopeless 0 0 0 0 0   PHQ - 2 Score 0 0 0 0 0  Altered sleeping 0 0 - - -  Tired, decreased energy 2 0 - - -  Change in appetite 1 0 - - -  Feeling bad or failure about yourself  0 0 - - -  Trouble concentrating 0 0 - - -  Moving slowly or fidgety/restless 0 0 - - -  Suicidal thoughts 0 0 - - -  PHQ-9 Score 3 0 - - -  Difficult doing work/chores Not difficult at all Not difficult at all - - -   Interpretation of Total Score  Total Score Depression Severity:  1-4 = Minimal depression, 5-9 = Mild depression, 10-14 = Moderate depression, 15-19 = Moderately severe depression, 20-27 = Severe depression   Psychosocial Evaluation and Intervention:  Psychosocial Evaluation - 01/30/24 1002       Psychosocial Evaluation & Interventions   Interventions Encouraged to exercise with the program and follow exercise prescription    Comments Dick is coming into pulmonary rehab for COPD.  He has a long history and has previously been worked up for transplant, but was disqualified after finding an aneursym.  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  He lives with his wife and has his daughter and her son to check on them frequently.  They have no pets or environmental concerns at home. He does use a scooter to get around due to his SOB.  His wife will help him in the shower, but otherwise he is independently with ADLs.  He is 3L oxygen   [facility] 24/7. He will use 4L in shower and walking outside.  He is not able to go and do as he pleases due to his breathing.  He wants to be able to do more and not get so SOB.  They came to look at program prior to signing up and were pleased.  He did rehab before at [facility] prior to transplant work up. They have no barriers to attending other than $15 copay. We talked about how it can be set up as payment plan with hospital as well.    Expected Outcomes Short: Attend rehab regularly Long: Improve stamina and breathing    Continue Psychosocial Services  Follow up required by staff           Psychosocial Re-Evaluation:  Psychosocial Re-Evaluation     Row Name 03/03/24 1258 03/29/24 0931 04/12/24 1144         Psychosocial Re-Evaluation   Current issues with None Identified None Identified Current Stress Concerns;Current Sleep Concerns     Comments Dick is doing well in rehab. He stated that he does not have very many issues with his sleep. He does get up during the night to use the restroom but is able to get back to sleep. He also stated that he does not have much stress in his life. He has his everyday worrying about his breathing but other then that he isnt very stressed. Dick is doing well in rehab. He identifies no stressors in his life. He states he is sleeping ok, he is a very light sleeper so he will wake up easily during the night. Dick is doing well in rehab. He stated that he get 4 hours of sleep and does okay. He is not napping during the day. He recently had a PET scan done of his intestines and said that they found something but was unsure. He is not really worried or stressed     Expected Outcomes Short: continue to have outlets for stress   long : continue to exericse for ovr all stress relef Short: Continue to attend rehab. Long:Continue to maintain a stress free lifestyle. Short: Continue to attend rehab. Long:Continue to maintain a stress free lifestyle.     Interventions Encouraged to attend Pulmonary Rehabilitation for the exercise Encouraged to attend Pulmonary Rehabilitation for the exercise Encouraged to attend Pulmonary Rehabilitation for the exercise     Continue Psychosocial Services  Follow up required by staff Follow up required by staff Follow up required by staff        Psychosocial Discharge (Final Psychosocial Re-Evaluation):  Psychosocial Re-Evaluation - 04/12/24 1144       Psychosocial Re-Evaluation   Current issues with Current Stress Concerns;Current Sleep Concerns    Comments Dick is doing well in rehab. He stated that he get 4 hours of  sleep and does okay. He is not napping during the day. He recently had a PET scan done of his intestines and said that they found something but was unsure. He is not really worried or stressed    Expected Outcomes Short: Continue to attend rehab. Long:Continue to maintain a stress free lifestyle.    Interventions Encouraged to attend Pulmonary Rehabilitation for the exercise    Continue Psychosocial Services  Follow up required by staff           Education: Education Goals: Education classes will be provided on a weekly basis, covering required topics. Participant will state understanding/return demonstration of topics presented.  Learning Barriers/Preferences:  Learning Barriers/Preferences -  01/30/24 9075       Learning Barriers/Preferences   Learning Barriers Sight   glasses   Learning Preferences Skilled Demonstration          Education Topics: How Lungs Work and Diseases: - Discuss the anatomy of the lungs and diseases that can affect the lungs, such as COPD.   Exercise: -Discuss the importance of exercise, FITT principles of exercise, normal and abnormal responses to exercise, and how to exercise safely.   Environmental Irritants: -Discuss types of environmental irritants and how to limit exposure to environmental irritants.   Meds/Inhalers and oxygen : - Discuss respiratory medications, definition of an inhaler and oxygen , and the proper way to use an inhaler and oxygen .   Energy Saving Techniques: - Discuss methods to conserve energy and decrease shortness of breath when performing activities of daily living.  Flowsheet Row PULMONARY REHAB CHRONIC OBSTRUCTIVE PULMONARY DISEASE from 04/21/2024 in [place] [facility]  Date 03/17/24  Educator HB  Instruction Review Code 1- Verbalizes Understanding    Bronchial Hygiene / Breathing Techniques: - Discuss breathing mechanics, pursed-lip breathing technique,  proper posture, effective ways to clear  airways, and other functional breathing techniques   Cleaning Equipment: - Provides group verbal and written instruction about the health risks of elevated stress, cause of high stress, and healthy ways to reduce stress.   Nutrition I: Fats: - Discuss the types of cholesterol, what cholesterol does to the body, and how cholesterol levels can be controlled. Flowsheet Row PULMONARY REHAB CHRONIC OBSTRUCTIVE PULMONARY DISEASE from 04/21/2024 in [place] [facility]  Date 03/31/24  Educator jh  Instruction Review Code 1- Verbalizes Understanding    Nutrition II: Labels: -Discuss the different components of food labels and how to read food labels. Flowsheet Row PULMONARY REHAB CHRONIC OBSTRUCTIVE PULMONARY DISEASE from 04/21/2024 in [place] [facility]  Date 03/31/24  Educator jh  Instruction Review Code 1- Verbalizes Understanding    Respiratory Infections: - Discuss the signs and symptoms of respiratory infections, ways to prevent respiratory infections, and the importance of seeking medical treatment when having a respiratory infection.   Stress I: Signs and Symptoms: - Discuss the causes of stress, how stress may lead to anxiety and depression, and ways to limit stress. Flowsheet Row PULMONARY REHAB CHRONIC OBSTRUCTIVE PULMONARY DISEASE from 04/21/2024 in [place] [facility]  Date 02/04/24  Educator [facility]  Instruction Review Code 1- Verbalizes Understanding    Stress II: Relaxation: -Discuss relaxation techniques to limit stress. Flowsheet Row PULMONARY REHAB CHRONIC OBSTRUCTIVE PULMONARY DISEASE from 04/21/2024 in [place] [facility]  Date 02/04/24  Educator [facility]  Instruction Review Code 1- Verbalizes Understanding    Oxygen  for Home/Travel: - Discuss how to prepare for travel when on oxygen  and proper ways to transport and store oxygen  to ensure safety.   Knowledge Questionnaire Score:  Knowledge Questionnaire Score -  02/02/24 1638       Knowledge Questionnaire Score   Pre Score 15/18          Core Components/Risk Factors/Patient Goals at Admission:  Personal Goals and Risk Factors at Admission - 02/02/24 1639       Core Components/Risk Factors/Patient Goals on Admission    Weight Management Yes;Weight Maintenance    Intervention Weight Management: Develop a combined nutrition and exercise program designed to reach desired caloric intake, while maintaining appropriate intake of nutrient and fiber, sodium and fats, and appropriate energy expenditure required for the weight goal.;Weight Management: Provide education and appropriate resources to help  participant work on and attain dietary goals.    Admit Weight 149 lb 9.6 oz (67.9 kg)    Goal Weight: Short Term 150 lb (68 kg)    Goal Weight: Long Term 150 lb (68 kg)    Expected Outcomes Short Term: Continue to assess and modify interventions until short term weight is achieved;Long Term: Adherence to nutrition and physical activity/exercise program aimed toward attainment of established weight goal;Weight Maintenance: Understanding of the daily nutrition guidelines, which includes 25-35% calories from fat, 7% or less cal from saturated fats, less than 200mg  cholesterol, less than 1.5gm of sodium, & 5 or more servings of fruits and vegetables daily    Improve shortness of breath with ADL's Yes    Intervention Provide education, individualized exercise plan and daily activity instruction to help decrease symptoms of SOB with activities of daily living.    Expected Outcomes Short Term: Improve cardiorespiratory fitness to achieve a reduction of symptoms when performing ADLs;Long Term: Be able to perform more ADLs without symptoms or delay the onset of symptoms    Increase knowledge of respiratory medications and ability to use respiratory devices properly  Yes    Intervention Provide education and demonstration as needed of appropriate use of medications,  inhalers, and oxygen  therapy.    Expected Outcomes Short Term: Achieves understanding of medications use. Understands that oxygen  is a medication prescribed by physician. Demonstrates appropriate use of inhaler and oxygen  therapy.;Long Term: Maintain appropriate use of medications, inhalers, and oxygen  therapy.    Hypertension Yes    Intervention Provide education on lifestyle modifcations including regular physical activity/exercise, weight management, moderate sodium restriction and increased consumption of fresh fruit, vegetables, and low fat dairy, alcohol moderation, and smoking cessation.;Monitor prescription use compliance.    Expected Outcomes Short Term: Continued assessment and intervention until BP is < 140/14mm HG in hypertensive participants. < 130/29mm HG in hypertensive participants with diabetes, heart failure or chronic kidney disease.;Long Term: Maintenance of blood pressure at goal levels.    Lipids Yes    Intervention Provide education and support for participant on nutrition & aerobic/resistive exercise along with prescribed medications to achieve LDL 70mg , HDL >40mg .    Expected Outcomes Short Term: Participant states understanding of desired cholesterol values and is compliant with medications prescribed. Participant is following exercise prescription and nutrition guidelines.;Long Term: Cholesterol controlled with medications as prescribed, with individualized exercise RX and with personalized nutrition plan. Value goals: LDL < 70mg , HDL > 40 mg.          Core Components/Risk Factors/Patient Goals Review:   Goals and Risk Factor Review     Row Name 03/03/24 1324 03/29/24 0933 04/12/24 1150         Core Components/Risk Factors/Patient Goals Review   Personal Goals Review Weight Management/Obesity;Stress;Improve shortness of breath with ADL's Weight Management/Obesity;Improve shortness of breath with ADL's;Develop more efficient breathing techniques such as purse lipped  breathing and diaphragmatic breathing and practicing self-pacing with activity. Weight Management/Obesity;Improve shortness of breath with ADL's;Increase knowledge of respiratory medications and ability to use respiratory devices properly.;Develop more efficient breathing techniques such as purse lipped breathing and diaphragmatic breathing and practicing self-pacing with activity.     Review Dick is doing well in rehab. He Dick is doing well in rehab. He takes all his medications as prescribed. He checks his O2 at home and it often runs low like 85% when he has walked just a short distance. He doesn't check his BP at home, which is really hasn't been  a problem for him here at rehab. Dick is doing well in rehab. He is taking all medications as prescribed and continues to check his O2 stats outside of class. He is focusing on PLB when he gets SOB and when his oxygen  drops. He does not check his BP at home, his BP has not been an issue during rehab but it is encourged to check at home to know paitents resting numbers     Expected Outcomes -- Short: Continue to check O2 saturation at home. Long: Report abnormalities to all healthcare professionals. Short: Continue to check O2 saturation at home. Long: Report abnormalities to all healthcare professionals.        Core Components/Risk Factors/Patient Goals at Discharge (Final Review):   Goals and Risk Factor Review - 04/12/24 1150       Core Components/Risk Factors/Patient Goals Review   Personal Goals Review Weight Management/Obesity;Improve shortness of breath with ADL's;Increase knowledge of respiratory medications and ability to use respiratory devices properly.;Develop more efficient breathing techniques such as purse lipped breathing and diaphragmatic breathing and practicing self-pacing with activity.    Review Dick is doing well in rehab. He is taking all medications as prescribed and continues to check his O2 stats outside of class. He is focusing on  PLB when he gets SOB and when his oxygen  drops. He does not check his BP at home, his BP has not been an issue during rehab but it is encourged to check at home to know paitents resting numbers    Expected Outcomes Short: Continue to check O2 saturation at home. Long: Report abnormalities to all healthcare professionals.          ITP Comments:  ITP Comments     Row Name 01/30/24 1011 02/02/24 1630 02/04/24 0913 02/11/24 0828 03/10/24 0754   ITP Comments Completed virtual orientation today in person as he came to see program prior to signing up.  EP evaluation is scheduled for Monday May 12 at 1300 .  Documentation for diagnosis can be found in [facility] encounter 01/26/24. Patient attend orientation today.  Patient is attending Pulmonary Rehabilitation Program.  Documentation for diagnosis can be found in [facility] OV 01/26/24.  Reviewed medical chart, RPE/RPD, gym safety, and program guidelines.  Patient was fitted to equipment they will be using during rehab.  Patient is scheduled to start exercise on Wed 5/14 at 915.   Initial ITP created and sent for review and signature by Dr. Anton Kelp, Medical Director for Pulmonary Rehabilitation Program. First full day of exercise!  Patient was oriented to gym and equipment including functions, settings, policies, and procedures.  Patient's individual exercise prescription and treatment plan were reviewed.  All starting workloads were established based on the results of the 6 minute walk test done at initial orientation visit.  The plan for exercise progression was also introduced and progression will be customized based on patient's performance and goals. 30 day review completed. ITP sent to Dr.Jehanzeb Memon, Medical Director of  Pulmonary Rehab. Continue with ITP unless changes are made by physician.  New to program. 30 day review completed. ITP sent to Dr.Jehanzeb Memon, Medical Director of Pulmonary Rehab. Continue with ITP unless changes are made by physician.     Row Name 04/05/24 9061 04/07/24 0830 05/05/24 1002       ITP Comments Called to check on patient when he did not show for rehab today.  Daril was having a bad breathing day and did not feel up to coming to rehab  today.  He hopes to return on Wednesday and was encouraged to use his nebulizer an extra time today. 30 day review completed. ITP sent to Dr.Jehanzeb Memon, Medical Director of  Pulmonary Rehab. Continue with ITP unless changes are made by physician. 30 day review completed. ITP sent to Dr.Jehanzeb Memon, Medical Director of  Pulmonary Rehab. Continue with ITP unless changes are made by physician.        Comments: 30 day review

## 2024-05-05 NOTE — Progress Notes (Signed)
 Daily Session Note  Patient Details  Name: ESTELLE SKIBICKI MRN: [id number] Date of Birth: 27-Sep-1955 Referring Provider:   Flowsheet Row PULMONARY REHAB COPD ORIENTATION from 02/02/2024 in [facility] CARDIAC REHABILITATION  Referring Provider [name] MD    Encounter Date: 05/05/2024  Check In:  Session Check In - 05/05/24 0915       Check-In   Supervising physician immediately available to respond to emergencies See telemetry face sheet for immediately available MD    Location [facility]    Staff Present Powell Benders, BS, Exercise Physiologist;Victoria Zina, RN    Virtual Visit No    Medication changes reported     No    Tobacco Cessation No Change    Warm-up and Cool-down Performed on first and last piece of equipment    Resistance Training Performed Yes    VAD Patient? No    PAD/SET Patient? No      Pain Assessment   Multiple Pain Sites No          Capillary Blood Glucose: No results found for this or any previous visit (from the past 24 hours).    Social History   Tobacco Use  Smoking Status Former   Current packs/day: 0.00   Average packs/day: 2.0 packs/day for 51.6 years (103.1 ttl pk-yrs)   Types: Cigarettes   Start date: 55   Quit date: 04/18/2020   Years since quitting: 4.0  Smokeless Tobacco Never  Tobacco Comments   2 plus packs per day. He has a 100 + pack - year history of tobacco abuse currently. Former 4 ppd for 25 years.    Goals Met:  Independence with exercise equipment Using PLB without cueing & demonstrates good technique Exercise tolerated well No report of concerns or symptoms today Strength training completed today  Goals Unmet:  Not Applicable  Comments: Pt able to follow exercise prescription today without complaint.  Will continue to monitor for progression.

## 2024-05-05 NOTE — Telephone Encounter (Signed)
 Letter printed and faxed to Dr Jude.  Confirmation was received.

## 2024-05-05 NOTE — Telephone Encounter (Signed)
 Please fax a formal request to us  and will address at that time. Thank you. Our fax number is [phone].

## 2024-05-06 NOTE — Telephone Encounter (Signed)
 Fax received from Dr. Rosario with [facility] to perform a colonoscopy on patient.  Patient needs surgery clearance. Surgery is 05/25/24. Patient was seen on 01/26/24. Office protocol is a risk assessment can be sent to surgeon if patient has been seen in 60 days or less.   I called and spoke with the pt and scheduled him for risk assessment with Candis for 05/11/24   Will hold in the clearance pool for now.

## 2024-05-10 ENCOUNTER — Encounter (HOSPITAL_COMMUNITY)

## 2024-05-10 ENCOUNTER — Ambulatory Visit: Attending: Cardiology | Admitting: Emergency Medicine

## 2024-05-10 DIAGNOSIS — Z01810 Encounter for preprocedural cardiovascular examination: Secondary | ICD-10-CM | POA: Diagnosis not present

## 2024-05-10 NOTE — Progress Notes (Signed)
 Virtual Visit via Telephone Note   Because of John [name] co-morbid illnesses, he is at least at moderate risk for complications without adequate follow up.  This format is felt to be most appropriate for this patient at this time.  Due to technical limitations with video connection (technology), today's appointment will be conducted as an audio only telehealth visit, and John [name] verbally agreed to proceed in this manner.   All issues noted in this document were discussed and addressed.  No physical exam could be performed with this format.  Evaluation Performed:  Preoperative cardiovascular risk assessment _____________   Date:  05/10/2024   Patient ID:  John [name], John [name], MRN [id number] Patient Location:  Home Provider location:   Office  Primary Care Provider:  Jude Harden GAILS, MD Primary Cardiologist:  John Cash, MD  Chief Complaint / Patient Profile   68 y.o. y/o male with a h/o coronary artery disease, hypertension, hyperlipidemia, chronic diastolic CHF, tobacco abuse, AAA s/p endovascular repair, and severe COPD who is pending colonoscopy on 05/25/2024 with [facility] gastroenterology and presents today for telephonic preoperative cardiovascular risk assessment.  History of Present Illness    John [name] is a 68 y.o. male who presents via audio/video conferencing for a telehealth visit today.  Pt was last seen in cardiology clinic on 12/05/2023 by Dr. Cash.  At that time John [name] was doing well.  The patient is now pending procedure as outlined above. Since his last visit, he denies chest pain, lower extremity edema, fatigue, palpitations,  hemoptysis, diaphoresis, weakness, presyncope, syncope, and PND.  Today patient is doing well overall.  He is without any acute concerns at this time.  He denies any chest pain or any further exertional symptoms.  He completes pulmonary rehab twice a week without anginal symptoms.  He  does light to moderate housework without limitation.  Does note his shortness of breath in relation to his COPD does not affect his activity level.  Overall he is able to complete greater than 4 METS.  Past Medical History    Past Medical History:  Diagnosis Date   Alcohol abuse 12/27/2010   Allergic rhinitis, cause unspecified 12/27/2010   Anal warts 04/11/2012   CAD, NATIVE VESSEL 10/04/2010   COPD (chronic obstructive pulmonary disease) (HCC)    a touch (09/28/2013)   History of blood transfusion 03/2012   related to nose bleed   HYPERLIPIDEMIA-MIXED 10/04/2010   HYPERTENSION, BENIGN 10/04/2010   Myocardial infarction (HCC) 09/16/2010   Pneumonia    when I was a kid   Stented coronary artery    Mid LAD    TOBACCO ABUSE 10/04/2010   Past Surgical History:  Procedure Laterality Date   ABDOMINAL AORTIC ENDOVASCULAR STENT GRAFT N/A 07/28/2022   Procedure: ABDOMINAL AORTIC ENDOVASCULAR STENT GRAFT;  Surgeon: John Fonda BRAVO, MD;  Location: [facility] OR;  Service: Vascular;  Laterality: N/A;   CARDIAC CATHETERIZATION  ~ 2012   CORONARY ANGIOPLASTY WITH STENT PLACEMENT  09/17/2010; 09/28/2014   1; 2   FINGER SURGERY Left    almost cut off tip of 2nd digit   LEFT HEART CATHETERIZATION WITH CORONARY ANGIOGRAM N/A 09/28/2014   Procedure: LEFT HEART CATHETERIZATION WITH CORONARY ANGIOGRAM;  Surgeon: John JONETTA Cash, MD;  Location: [facility] CATH LAB;  Service: Cardiovascular;  Laterality: N/A;   NASAL ENDOSCOPY WITH EPISTAXIS CONTROL Bilateral 03/2012   ULTRASOUND GUIDANCE FOR VASCULAR ACCESS N/A 07/28/2022   Procedure: ULTRASOUND GUIDANCE FOR VASCULAR ACCESS;  Surgeon: John Fonda BRAVO, MD;  Location: [facility] OR;  Service: Vascular;  Laterality: N/A;   VASECTOMY      Allergies  Allergies  Allergen Reactions   Penicillins Other (See Comments)    Passes out    Home Medications    Prior to Admission medications   Medication Sig Start Date End Date Taking? Authorizing Provider  albuterol  (PROAIR   HFA) 108 (90 Base) MCG/ACT inhaler Inhale 2 puffs into the lungs every 4 (four) hours as needed for wheezing or shortness of breath. 2 puffs every 4 hours as needed only  if your can't catch your breath 06/12/23   Shellia Oh, MD  albuterol  (PROVENTIL ) (2.5 MG/3ML) 0.083% nebulizer solution Take 3 mLs (2.5 mg total) by nebulization every 4 (four) hours as needed for wheezing or shortness of breath. 03/23/24   John Harden GAILS, MD  aspirin  EC 81 MG tablet Take 81 mg by mouth daily.    [provider]  atorvastatin  (LIPITOR ) 40 MG tablet Take 1 tablet (40 mg total) by mouth daily. 12/05/23   John John BIRCH, MD  budesonide -glycopyrrolate -formoterol  (BREZTRI  AEROSPHERE) 160-9-4.8 MCG/ACT AERO inhaler Inhale 2 puffs into the lungs in the morning and at bedtime. 04/13/24   John Harden GAILS, MD  furosemide  (LASIX ) 40 MG tablet Take 1 tablet (40 mg total) by mouth daily. 11/14/23   John John BIRCH, MD  guaiFENesin  (MUCINEX ) 600 MG 12 hr tablet Take 2 tablets (1,200 mg total) by mouth 2 (two) times daily as needed for cough or to loosen phlegm. 01/19/21   Sood, Vineet, MD  Magnesium  250 MG CAPS Take by mouth daily.    [provider]  Multiple Vitamin (MULTIVITAMIN WITH MINERALS) TABS tablet Take 1 tablet by mouth daily.    [provider]  nitroGLYCERIN  (NITROSTAT ) 0.4 MG SL tablet Place 1 tablet (0.4 mg total) under the tongue every 5 (five) minutes as needed for chest pain. 11/29/22   John John BIRCH, MD  OXYGEN  Inhale 2-3 L into the lungs.    [provider]    Physical Exam    Vital Signs:  John [name] does not have vital signs available for review today.  Given telephonic nature of communication, physical exam is limited. AAOx3. NAD. Normal affect.  Speech and respirations are unlabored.  Accessory Clinical Findings    None  Assessment & Plan    1.  Preoperative Cardiovascular Risk Assessment: According to the Revised Cardiac Risk Index  (RCRI), his Perioperative Risk of Major Cardiac Event is (%): 6.6. His Functional Capacity in METs is: 5.07 according to the Duke Activity Status Index (DASI). Therefore, based on ACC/AHA guidelines, patient would be at acceptable risk for the planned procedure without further cardiovascular testing.  The patient was advised that if he develops new symptoms prior to surgery to contact our office to arrange for a follow-up visit, and he verbalized understanding.  He may hold aspirin  for 7 days prior to procedure. Please resume aspirin  as soon as possible postprocedure, at the discretion of the surgeon.    A copy of this note will be routed to requesting surgeon.  Time:   Today, I have spent 12 minutes with the patient with telehealth technology discussing medical history, symptoms, and management plan.     Lum LITTIE Louis, NP  05/10/2024, 2:18 PM

## 2024-05-11 ENCOUNTER — Encounter (HOSPITAL_BASED_OUTPATIENT_CLINIC_OR_DEPARTMENT_OTHER): Payer: Self-pay

## 2024-05-11 ENCOUNTER — Ambulatory Visit (HOSPITAL_BASED_OUTPATIENT_CLINIC_OR_DEPARTMENT_OTHER)

## 2024-05-11 VITALS — BP 133/78 | HR 104 | Ht 69.0 in | Wt 150.0 lb

## 2024-05-11 DIAGNOSIS — J9611 Chronic respiratory failure with hypoxia: Secondary | ICD-10-CM | POA: Diagnosis not present

## 2024-05-11 DIAGNOSIS — J9612 Chronic respiratory failure with hypercapnia: Secondary | ICD-10-CM

## 2024-05-11 DIAGNOSIS — R911 Solitary pulmonary nodule: Secondary | ICD-10-CM | POA: Diagnosis not present

## 2024-05-11 DIAGNOSIS — J439 Emphysema, unspecified: Secondary | ICD-10-CM

## 2024-05-11 DIAGNOSIS — Z87891 Personal history of nicotine dependence: Secondary | ICD-10-CM

## 2024-05-11 NOTE — Patient Instructions (Addendum)
 Risk assessment completed:  per ARISCAT score:  Intermediate risk for pulmonary complications; see note for full details  Return to clinic as scheduled for follow up in September with Dr. Jude  Return sooner if new or worsening symptoms

## 2024-05-11 NOTE — Assessment & Plan Note (Signed)
-    Per recent PET scan nodule appears to have decreased in size and max SUV of 0.3 -  Routine follow up encouraged

## 2024-05-11 NOTE — Assessment & Plan Note (Addendum)
 ARISCAT score: Intermediate risk 13.3% risk of in-hospital post-op pulmonary complications (composite including respiratory failure, respiratory infection, pleural effusion, atelectasis, pneumothorax, bronchospasm, aspiration pneumonitis)  - Patient is determined to be intermediate risk for pulmonary complications per ARISCAT score and will require continued oxygen  support postoperatively as per his baseline requirements.  (Oxygen  at 3 lpm via [place] baseline) Recommend  early incentive spirometry, early ambulation, and potentially BiPAP support postoperatively pending course.

## 2024-05-11 NOTE — Progress Notes (Signed)
 @Patient  ID: John [name], male    DOB: April 29, 1956, 68 y.o.   MRN: [id number]  Chief Complaint  Patient presents with   Follow-up    Surgery clearance     Referring provider: Jude Harden GAILS, MD  HPI: John [name] is a 68 y.o. 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  He had a PET scan completed on 03/25/2024 (full report below) which demonstrated a few areas of increased uptake in the transverse and sigmoid colon.  He reports no new complaints today and denies any lung infections or URIs in the last 1-2 months.  He reports daily use of his maintenance inhaler and that he rarely uses his rescue inhaler.  He wears his oxygen  ATC.  He denies productive cough, fever, chills, congestion, chest pain, worsening dyspnea above baseline, HA, or other c/o.    TEST/EVENTS :  PET CT scan 03/25/2024:  IMPRESSION: Nodular areas seen in the lungs on the previous CT scan are again identified. The right basilar focus appears smaller and the left is similar. Neither of these areas show abnormal uptake. Recommend simple CT surveillance.   However there are 2 areas of abnormal uptake along the colon with masslike wall thickening. One along the proximal transverse colon and 1 along the distal sigmoid colon. Aggressive or neoplastic lesions are in the differential and recommend dedicated colonic evaluation when appropriate if there is no known history.   There is a third area of asymmetric uptake along the very proximal sigmoid colon where as wall thickening and spiculation which was seen on the prior examination 2023. This has less intense uptake than the other areas of colonic uptake and could be sequela of diverticulitis. Please correlate with symptoms and history.   Aortic endograft  in place. The size of the aneurysm is markedly decreased from the study of 2023. Dedicated endograft protocol could be performed as and when clinically appropriate.   Advanced emphysematous lung changes.  Allergies  Allergen Reactions   Penicillins Other (See Comments)    Passes out    Immunization History  Administered Date(s) Administered    sv, Bivalent, Protein Subunit Rsvpref,pf (Abrysvo) 09/12/2023   Fluad Quad(high Dose 65+) 06/23/2022   Fluad Trivalent(High Dose 65+) 07/23/2023   Hep A / Hep B 04/10/2021   Hepatitis B, ADULT 04/10/2021   Influenza,inj,Quad PF,6+ Mos 07/05/2020, 07/31/2021   Influenza-Unspecified 07/05/2020   Moderna Covid-19 Fall Seasonal Vaccine 71yrs & older 08/11/2023   Moderna Sars-Covid-2 Vaccination 12/15/2019, 01/12/2020, 08/15/2020   Pneumococcal Conjugate-13 10/10/2021   Pneumococcal Polysaccharide-23 10/11/2020    Past Medical History:  Diagnosis Date   Alcohol abuse 12/27/2010   Allergic rhinitis, cause unspecified 12/27/2010   Anal warts 04/11/2012   CAD, NATIVE VESSEL 10/04/2010   COPD (chronic obstructive pulmonary disease) (HCC)    a touch (09/28/2013)   History of blood transfusion 03/2012   related to nose bleed   HYPERLIPIDEMIA-MIXED 10/04/2010   HYPERTENSION, BENIGN 10/04/2010   Myocardial infarction (HCC) 09/16/2010   Pneumonia    when I was a kid   Stented coronary artery    Mid LAD    TOBACCO ABUSE 10/04/2010    Tobacco History: Social History   Tobacco Use  Smoking Status Former   Current packs/day: 0.00   Average packs/day: 2.0 packs/day for 51.6 years (103.1 ttl pk-yrs)  Types: Cigarettes   Start date: 22   Quit date: 04/18/2020   Years since quitting: 4.0  Smokeless Tobacco Never  Tobacco Comments   2 plus packs per day. He has a 100 + pack - year history of tobacco abuse currently. Former 4 ppd for 25 years.   Counseling given: Not Answered Tobacco comments: 2 plus packs per day. He has a 100 + pack - year  history of tobacco abuse currently. Former 4 ppd for 25 years.   Outpatient Medications Prior to Visit  Medication Sig Dispense Refill   albuterol  (PROAIR  HFA) 108 (90 Base) MCG/ACT inhaler Inhale 2 puffs into the lungs every 4 (four) hours as needed for wheezing or shortness of breath. 2 puffs every 4 hours as needed only  if your can't catch your breath 8 g 5   albuterol  (PROVENTIL ) (2.5 MG/3ML) 0.083% nebulizer solution Take 3 mLs (2.5 mg total) by nebulization every 4 (four) hours as needed for wheezing or shortness of breath. 360 mL 5   aspirin  EC 81 MG tablet Take 81 mg by mouth daily.     atorvastatin  (LIPITOR ) 40 MG tablet Take 1 tablet (40 mg total) by mouth daily. 90 tablet 3   budesonide -glycopyrrolate -formoterol  (BREZTRI  AEROSPHERE) 160-9-4.8 MCG/ACT AERO inhaler Inhale 2 puffs into the lungs in the morning and at bedtime. 10.7 g 11   furosemide  (LASIX ) 40 MG tablet Take 1 tablet (40 mg total) by mouth daily. 90 tablet 2   guaiFENesin  (MUCINEX ) 600 MG 12 hr tablet Take 2 tablets (1,200 mg total) by mouth 2 (two) times daily as needed for cough or to loosen phlegm.     Magnesium  250 MG CAPS Take by mouth daily.     Multiple Vitamin (MULTIVITAMIN WITH MINERALS) TABS tablet Take 1 tablet by mouth daily.     nitroGLYCERIN  (NITROSTAT ) 0.4 MG SL tablet Place 1 tablet (0.4 mg total) under the tongue every 5 (five) minutes as needed for chest pain. 25 tablet 6   OXYGEN  Inhale 2-3 L into the lungs.     No facility-administered medications prior to visit.     Review of Systems:   Constitutional:   No  weight loss, night sweats,  Fevers, chills, fatigue, or  lassitude.  HEENT:   No headaches,  Difficulty swallowing,  Tooth/dental problems, or  Sore throat,                No sneezing, itching, ear ache, nasal congestion, post nasal drip,   CV:  No chest pain,  Orthopnea, PND, swelling in lower extremities, anasarca, dizziness, palpitations, syncope.   GI  No heartburn, indigestion,  abdominal pain, nausea, vomiting, diarrhea, change in bowel habits, loss of appetite, bloody stools.   Resp: No shortness of breath with exertion or at rest.  No excess mucus, no productive cough,  No non-productive cough,  No coughing up of blood.  No change in color of mucus.  No wheezing.  No chest wall deformity  Skin: no rash or lesions.  GU: no dysuria, change in color of urine, no urgency or frequency.  No flank pain, no hematuria   MS:  No joint pain or swelling.  No decreased range of motion.  No back pain.    Physical Exam  BP 133/78 (BP Location: Left Arm, Patient Position: Sitting)   Pulse (!) 104   Ht 5' 9 (1.753 m)   Wt 150 lb (68 kg)   SpO2 95% Comment: 3 liters  BMI 22.15 kg/m  GEN: A/Ox3; pleasant , NAD, well nourished.  In motorized chair wearing oxygen    HEENT:  [place]/AT,  EACs-clear, TMs-wnl, NOSE-clear, THROAT-clear, no lesions, no postnasal drip or exudate noted.   NECK:  Supple w/ fair ROM; no JVD; normal carotid impulses w/o bruits; no thyromegaly or nodules palpated; no lymphadenopathy.    RESP  diminished but clear P & A; w/o, wheezes/ rales/ or rhonchi. no accessory muscle use, no dullness to percussion  CARD:  RRR, no m/r/g, no peripheral edema, pulses intact, no cyanosis or clubbing.  GI:   Soft & nt; nml bowel sounds; no organomegaly or masses detected.   Musco: Warm bil, no deformities or joint swelling noted.   Neuro: alert, no focal deficits noted.    Skin: Warm, no lesions or rashes    Lab Results:  CBC    Component Value Date/Time   WBC 8.3 10/21/2023 1341   WBC 9.4 07/29/2022 0411   RBC 4.01 (L) 10/21/2023 1341   RBC 2.69 (L) 07/29/2022 0411   HGB 12.9 (L) 10/21/2023 1341   HCT 39.5 10/21/2023 1341   PLT 198 10/21/2023 1341   MCV 99 (H) 10/21/2023 1341   MCH 32.2 10/21/2023 1341   MCH 30.5 07/29/2022 0411   MCHC 32.7 10/21/2023 1341   MCHC 32.5 07/29/2022 0411   RDW 12.4 10/21/2023 1341   LYMPHSABS 0.9 10/21/2023 1341    MONOABS 1.6 (H) 07/27/2022 2106   EOSABS 1.1 (H) 10/21/2023 1341   BASOSABS 0.1 10/21/2023 1341    BMET    Component Value Date/Time   NA 145 (H) 10/21/2023 1341   K 4.5 10/21/2023 1341   CL 96 10/21/2023 1341   CO2 38 (H) 10/21/2023 1341   GLUCOSE 101 (H) 10/21/2023 1341   GLUCOSE 96 07/30/2022 0414   GLUCOSE 123 (H) 08/15/2006 0929   BUN 17 10/21/2023 1341   CREATININE 1.12 10/21/2023 1341   CALCIUM  9.5 10/21/2023 1341   GFRNONAA 48 (L) 07/30/2022 0414   GFRAA >60 04/23/2020 0832    BNP    Component Value Date/Time   BNP 75.0 12/20/2020 2309    ProBNP    Component Value Date/Time   PROBNP 3,582 (H) 11/23/2019 1237    Imaging: No results found.  Administration History     None          Latest Ref Rng & Units 12/09/2019   12:47 PM  PFT Results  FVC-Pre L 1.67   FVC-Predicted Pre % 34   FVC-Post L 1.62   FVC-Predicted Post % 33   Pre FEV1/FVC % % 35   Post FEV1/FCV % % 37   FEV1-Pre L 0.58   FEV1-Predicted Pre % 15   FEV1-Post L 0.59   DLCO uncorrected ml/min/mmHg 7.65   DLCO UNC% % 27   DLCO corrected ml/min/mmHg 7.13   DLCO COR %Predicted % 25   DLVA Predicted % 31     No results found for: NITRICOXIDE   Assessment & Plan:  John [name] is a 68 y.o. 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  Recent PET scan demonstrated abnormal uptake in multiple areas of the colon. Assessment & Plan Chronic respiratory failure with hypoxia and hypercapnia (HCC) ARISCAT score: Intermediate risk 13.3% risk of in-hospital post-op pulmonary complications (composite including respiratory failure, respiratory infection, pleural effusion, atelectasis, pneumothorax, bronchospasm, aspiration  pneumonitis)  - Patient is determined to be intermediate risk for  pulmonary complications per ARISCAT score and will require continued oxygen  support postoperatively as per his baseline requirements.  (Oxygen  at 3 lpm via [place] baseline) Recommend  early incentive spirometry, early ambulation, and potentially BiPAP support postoperatively pending course. Lung nodule -  Per recent PET scan nodule appears to have decreased in size and max SUV of 0.3 -  Routine follow up encouraged Pulmonary emphysema, unspecified emphysema type (HCC) -  Continue Breztri  BID -  continue Albuterol  as needed -  continue supplemental oxygen    Return as scheduled in September with Dr. Jude, for PFT.  Candis Dandy, PA-C 05/11/2024

## 2024-05-11 NOTE — Assessment & Plan Note (Signed)
-    Continue Breztri  BID -  continue Albuterol  as needed -  continue supplemental oxygen

## 2024-05-12 ENCOUNTER — Encounter (HOSPITAL_COMMUNITY)
Admission: RE | Admit: 2024-05-12 | Discharge: 2024-05-12 | Disposition: A | Discharge status: Home / Self Care | Source: Ambulatory Visit | Attending: Pulmonary Disease

## 2024-05-12 DIAGNOSIS — J4489 Other specified chronic obstructive pulmonary disease: Secondary | ICD-10-CM | POA: Diagnosis not present

## 2024-05-12 DIAGNOSIS — J439 Emphysema, unspecified: Secondary | ICD-10-CM

## 2024-05-12 NOTE — Progress Notes (Signed)
 Daily Session Note  Patient Details  Name: John [name] MRN: [id number] Date of Birth: November 30, 1955 Referring Provider:   Flowsheet Row PULMONARY REHAB COPD ORIENTATION from 02/02/2024 in [facility] CARDIAC REH[facility]ATION  Referring Provider [name] MD    Encounter Date: 05/12/2024  Check In:  Session Check In - 05/12/24 0956       Check-In   Supervising physician immediately available to respond to emergencies See telemetry face sheet for immediately available MD    Location [facility]    Staff Present Adrien Louder, RN, BSN;Brittany Jackquline, BSN, RN, Estrella Daring, BS, RRT, CPFT;Nellie Pester Zina, RN    Virtual Visit No    Medication changes reported     No    Fall or balance concerns reported    No    Warm-up and Cool-down Performed on first and last piece of equipment    Resistance Training Performed Yes    VAD Patient? No    PAD/SET Patient? No      Pain Assessment   Currently in Pain? No/denies          Capillary Blood Glucose: No results found for this or any previous visit (from the past 24 hours).    Social History   Tobacco Use  Smoking Status Former   Current packs/day: 0.00   Average packs/day: 2.0 packs/day for 51.6 years (103.1 ttl pk-yrs)   Types: Cigarettes   Start date: 23   Quit date: 04/18/2020   Years since quitting: 4.0  Smokeless Tobacco Never  Tobacco Comments   2 plus packs per day. He has a 100 + pack - year history of tobacco abuse currently. Former 4 ppd for 25 years.    Goals Met:  Proper associated with RPD/PD & O2 Sat Independence with exercise equipment Using PLB without cueing & demonstrates good technique Exercise tolerated well No report of concerns or symptoms today Strength training completed today  Goals Unmet:  Not Applicable  Comments: Pt able to follow exercise prescription today without complaint.  Will continue to monitor for progression.

## 2024-05-12 NOTE — Telephone Encounter (Signed)
 Dr. Dorsey office, [facility],  calling and they need Surgical clearance. PT was seen yesterday. Please fax to [phone] or put in Epic under past tel encounter from Aug 6th. It was not clear if he was cleared or not  because of the term indeterminable   [place] [phone]

## 2024-05-13 ENCOUNTER — Encounter (HOSPITAL_COMMUNITY): Payer: Self-pay | Admitting: Internal Medicine

## 2024-05-14 ENCOUNTER — Encounter (HOSPITAL_COMMUNITY): Payer: Self-pay | Admitting: Internal Medicine

## 2024-05-14 ENCOUNTER — Other Ambulatory Visit: Payer: Self-pay

## 2024-05-14 NOTE — Progress Notes (Addendum)
 PCP - Dr. Tobie Cardiologist - Dr. Verlin  LOV 12-05-23 epic Clearance [place], [place] 05-10-24 Therese Staff, Harden, MD  Clearance 05-11-24 epic Candis Edis PA-C  PPM/ICD -  Device Orders -  Rep Notified -   Chest x-ray - CT chest- 03-14-24 epic EKG - 12-05-23 Stress Test -  ECHO - 2021 Cardiac Cath -  Pet scan- 03-26-24 epic  Sleep Study -  CPAP -   Fasting Blood Sugar -  Checks Blood Sugar _____ times a day  Blood Thinner Instructions: Aspirin  Instructions:81mg   asa  hold 7 days pt. Aware  Pt. Has bowel prep instructions  COVID vaccine -yes  Activity--Uses scooter can walk 20 ft. With o2 3L   Anesthesia review: continous oxygen  3L , uses portable ventilator 5 hours a day uses anytime during the day, Severe COPD  Patient denies shortness of breath, fever, cough and chest pain at PAT appointment   All instructions explained to the patient, with a verbal understanding of the material. Patient agrees to go over the instructions while at home for a better understanding. Patient also instructed to self quarantine after being tested for COVID-19. The opportunity to ask questions was provided.

## 2024-05-14 NOTE — Telephone Encounter (Signed)
 05/11/24 risk assessment from [place] faxed to [facility] attn [place]

## 2024-05-17 ENCOUNTER — Encounter (HOSPITAL_COMMUNITY)

## 2024-05-17 NOTE — Telephone Encounter (Addendum)
 FYI for this Pulmonology clearance on this patient  Office sent over the office note only and not the letter to us . In the office note its says.   ARISCAT score: Intermediate risk 13.3% risk of in-hospital post-op pulmonary complications (composite including respiratory failure, respiratory infection, pleural effusion, atelectasis, pneumothorax, bronchospasm, aspiration pneumonitis)   - Patient is determined to be intermediate risk for pulmonary complications per ARISCAT score and will require continued oxygen  support postoperatively as per his baseline requirements.  (Oxygen  at 3 lpm via [place] baseline) Recommend  early incentive spirometry, early ambulation, and potentially BiPAP support postoperatively pending course.

## 2024-05-17 NOTE — Telephone Encounter (Signed)
 Thank you :)

## 2024-05-19 ENCOUNTER — Encounter (HOSPITAL_COMMUNITY)
Admission: RE | Admit: 2024-05-19 | Discharge: 2024-05-19 | Disposition: A | Discharge status: Home / Self Care | Source: Ambulatory Visit | Attending: Pulmonary Disease

## 2024-05-19 DIAGNOSIS — J9610 Chronic respiratory failure, unspecified whether with hypoxia or hypercapnia: Secondary | ICD-10-CM | POA: Diagnosis not present

## 2024-05-19 DIAGNOSIS — J4489 Other specified chronic obstructive pulmonary disease: Secondary | ICD-10-CM | POA: Diagnosis not present

## 2024-05-19 DIAGNOSIS — J439 Emphysema, unspecified: Secondary | ICD-10-CM | POA: Diagnosis not present

## 2024-05-19 NOTE — Progress Notes (Signed)
 Daily Session Note  Patient Details  Name: TADEUSZ STAHL MRN: [id number] Date of Birth: 24-Apr-1956 Referring Provider:   Flowsheet Row PULMONARY REHAB COPD ORIENTATION from 02/02/2024 in [facility] CARDIAC REHABILITATION  Referring Provider [name] MD    Encounter Date: 05/19/2024  Check In:  Session Check In - 05/19/24 1005       Check-In   Supervising physician immediately available to respond to emergencies See telemetry face sheet for immediately available MD    Location [facility]    Staff Present Powell Benders, BS, Exercise Physiologist;Debra Vicci, RN, Randye Gelineau, MA, RCEP, CCRP, CCET    Virtual Visit No    Medication changes reported     No    Fall or balance concerns reported    No    Warm-up and Cool-down Performed on first and last piece of equipment    Resistance Training Performed Yes    VAD Patient? No    PAD/SET Patient? No      Pain Assessment   Currently in Pain? No/denies          Capillary Blood Glucose: No results found for this or any previous visit (from the past 24 hours).    Social History   Tobacco Use  Smoking Status Former   Current packs/day: 0.00   Average packs/day: 2.0 packs/day for 51.6 years (103.1 ttl pk-yrs)   Types: Cigarettes   Start date: 31   Quit date: 04/18/2020   Years since quitting: 4.0  Smokeless Tobacco Never  Tobacco Comments   2 plus packs per day. He has a 100 + pack - year history of tobacco abuse currently. Former 4 ppd for 25 years.    Goals Met:  Proper associated with RPD/PD & O2 Sat Independence with exercise equipment Using PLB without cueing & demonstrates good technique Exercise tolerated well Personal goals reviewed No report of concerns or symptoms today Strength training completed today  Goals Unmet:  Not Applicable  Comments: Pt able to follow exercise prescription today without complaint.  Will continue to monitor for progression.

## 2024-05-19 NOTE — Telephone Encounter (Signed)
   Patient was evaluated by televisit with Lum Louis, NP on 05-10-24 and advised, According to the Revised Cardiac Risk Index (RCRI), his Perioperative Risk of Major Cardiac Event is (%): 6.6. His Functional Capacity in METs is: 5.07 according to the Duke Activity Status Index (DASI). Therefore, based on ACC/AHA guidelines, patient would be at acceptable risk for the planned procedure without further cardiovascular testing.   The patient was advised that if he develops new symptoms prior to surgery to contact our office to arrange for a follow-up visit, and he verbalized understanding.   He may hold aspirin  for 7 days prior to procedure. Please resume aspirin  as soon as possible postprocedure, at the discretion of the surgeon.  Spoke with Marval (on DPR) who is aware that patient has been cleared to proceed with colonoscopy on 05-25-24.

## 2024-05-20 ENCOUNTER — Telehealth: Payer: Self-pay

## 2024-05-20 NOTE — Telephone Encounter (Signed)
 Procedure:COLON Procedure date: 05/25/24 Procedure location: [facility] Arrival Time: 11:15 Spoke with the patient Y/N: Y Any prep concerns? N  Has the patient obtained the prep from the pharmacy ? Y Do you have a care partner and transportation: Y Any additional concerns? N

## 2024-05-21 DIAGNOSIS — Z515 Encounter for palliative care: Secondary | ICD-10-CM | POA: Diagnosis not present

## 2024-05-21 DIAGNOSIS — J449 Chronic obstructive pulmonary disease, unspecified: Secondary | ICD-10-CM | POA: Diagnosis not present

## 2024-05-25 ENCOUNTER — Ambulatory Visit (HOSPITAL_BASED_OUTPATIENT_CLINIC_OR_DEPARTMENT_OTHER): Admitting: Anesthesiology

## 2024-05-25 ENCOUNTER — Ambulatory Visit (HOSPITAL_COMMUNITY): Admitting: Anesthesiology

## 2024-05-25 ENCOUNTER — Other Ambulatory Visit: Payer: Self-pay

## 2024-05-25 ENCOUNTER — Encounter (HOSPITAL_COMMUNITY)
Admission: RE | Disposition: A | Discharge status: Home / Self Care | Payer: Self-pay | Source: Home / Self Care | Attending: Internal Medicine

## 2024-05-25 ENCOUNTER — Ambulatory Visit (HOSPITAL_COMMUNITY)
Admission: RE | Admit: 2024-05-25 | Discharge: 2024-05-25 | Disposition: A | Discharge status: Home / Self Care | Attending: Internal Medicine | Admitting: Internal Medicine

## 2024-05-25 ENCOUNTER — Encounter (HOSPITAL_COMMUNITY): Payer: Self-pay | Admitting: Internal Medicine

## 2024-05-25 DIAGNOSIS — Z9981 Dependence on supplemental oxygen: Secondary | ICD-10-CM | POA: Insufficient documentation

## 2024-05-25 DIAGNOSIS — Z955 Presence of coronary angioplasty implant and graft: Secondary | ICD-10-CM | POA: Insufficient documentation

## 2024-05-25 DIAGNOSIS — I2510 Atherosclerotic heart disease of native coronary artery without angina pectoris: Secondary | ICD-10-CM | POA: Diagnosis not present

## 2024-05-25 DIAGNOSIS — I10 Essential (primary) hypertension: Secondary | ICD-10-CM

## 2024-05-25 DIAGNOSIS — K635 Polyp of colon: Secondary | ICD-10-CM

## 2024-05-25 DIAGNOSIS — C184 Malignant neoplasm of transverse colon: Secondary | ICD-10-CM | POA: Insufficient documentation

## 2024-05-25 DIAGNOSIS — K5730 Diverticulosis of large intestine without perforation or abscess without bleeding: Secondary | ICD-10-CM | POA: Insufficient documentation

## 2024-05-25 DIAGNOSIS — R933 Abnormal findings on diagnostic imaging of other parts of digestive tract: Secondary | ICD-10-CM | POA: Diagnosis not present

## 2024-05-25 DIAGNOSIS — Z87891 Personal history of nicotine dependence: Secondary | ICD-10-CM | POA: Diagnosis not present

## 2024-05-25 DIAGNOSIS — Z7951 Long term (current) use of inhaled steroids: Secondary | ICD-10-CM | POA: Insufficient documentation

## 2024-05-25 DIAGNOSIS — K625 Hemorrhage of anus and rectum: Secondary | ICD-10-CM

## 2024-05-25 DIAGNOSIS — I252 Old myocardial infarction: Secondary | ICD-10-CM | POA: Diagnosis not present

## 2024-05-25 DIAGNOSIS — K648 Other hemorrhoids: Secondary | ICD-10-CM | POA: Diagnosis not present

## 2024-05-25 DIAGNOSIS — J449 Chronic obstructive pulmonary disease, unspecified: Secondary | ICD-10-CM | POA: Diagnosis not present

## 2024-05-25 DIAGNOSIS — I714 Abdominal aortic aneurysm, without rupture, unspecified: Secondary | ICD-10-CM | POA: Insufficient documentation

## 2024-05-25 DIAGNOSIS — R195 Other fecal abnormalities: Secondary | ICD-10-CM | POA: Insufficient documentation

## 2024-05-25 DIAGNOSIS — D125 Benign neoplasm of sigmoid colon: Secondary | ICD-10-CM | POA: Diagnosis not present

## 2024-05-25 DIAGNOSIS — R1032 Left lower quadrant pain: Secondary | ICD-10-CM

## 2024-05-25 DIAGNOSIS — R948 Abnormal results of function studies of other organs and systems: Secondary | ICD-10-CM

## 2024-05-25 DIAGNOSIS — D128 Benign neoplasm of rectum: Secondary | ICD-10-CM | POA: Insufficient documentation

## 2024-05-25 DIAGNOSIS — C187 Malignant neoplasm of sigmoid colon: Secondary | ICD-10-CM | POA: Insufficient documentation

## 2024-05-25 DIAGNOSIS — Z1211 Encounter for screening for malignant neoplasm of colon: Secondary | ICD-10-CM | POA: Diagnosis not present

## 2024-05-25 HISTORY — PX: COLONOSCOPY: SHX5424

## 2024-05-25 HISTORY — DX: Abdominal aortic aneurysm, without rupture, unspecified: I71.40

## 2024-05-25 HISTORY — DX: Dependence on supplemental oxygen: Z99.81

## 2024-05-25 SURGERY — COLONOSCOPY
Anesthesia: Monitor Anesthesia Care

## 2024-05-25 MED ORDER — SPOT INK MARKER SYRINGE KIT
PACK | SUBMUCOSAL | Status: DC | PRN
  Administered 2024-05-25: 2 mL via SUBMUCOSAL
  Administered 2024-05-25: 1 mL via SUBMUCOSAL

## 2024-05-25 MED ORDER — SODIUM CHLORIDE 0.9 % IV SOLN
INTRAVENOUS | Status: AC | PRN
  Administered 2024-05-25: 500 mL via INTRAMUSCULAR

## 2024-05-25 MED ORDER — SODIUM CHLORIDE 0.9 % IV SOLN: INTRAVENOUS | Status: DC

## 2024-05-25 MED ORDER — PROPOFOL 1000 MG/100ML IV EMUL
INTRAVENOUS | Status: AC
  Filled 2024-05-25: qty 100

## 2024-05-25 MED ORDER — PROPOFOL 10 MG/ML IV BOLUS
INTRAVENOUS | Status: DC | PRN
  Administered 2024-05-25 (×2): 20 mg via INTRAVENOUS
  Administered 2024-05-25: 30 mg via INTRAVENOUS

## 2024-05-25 MED ORDER — PROPOFOL 500 MG/50ML IV EMUL
INTRAVENOUS | Status: DC | PRN
  Administered 2024-05-25: 150 ug/kg/min via INTRAVENOUS

## 2024-05-25 MED ORDER — PHENYLEPHRINE 80 MCG/ML (10ML) SYRINGE FOR IV PUSH (FOR BLOOD PRESSURE SUPPORT)
PREFILLED_SYRINGE | INTRAVENOUS | Status: DC | PRN
  Administered 2024-05-25: 80 ug via INTRAVENOUS
  Administered 2024-05-25 (×2): 160 ug via INTRAVENOUS

## 2024-05-25 NOTE — Transfer of Care (Signed)
 Immediate Anesthesia Transfer of Care Note  Patient: John [name]  Procedure(s) Performed: COLONOSCOPY  Patient Location: PACU  Anesthesia Type:MAC  Level of Consciousness: sedated  Airway & Oxygen  Therapy: Patient Spontanous Breathing and Patient connected to face mask oxygen   Post-op Assessment: Report given to RN and Post -op Vital signs reviewed and stable  Post vital signs: Reviewed and stable  Last Vitals:  Vitals Value Taken Time  BP    Temp    Pulse    Resp    SpO2      Last Pain:  Vitals:   05/25/24 1133  TempSrc: Tympanic  PainSc: 0-No pain         Complications: No notable events documented.

## 2024-05-25 NOTE — H&P (Signed)
 GASTROENTEROLOGY PROCEDURE H&P NOTE   Primary Care Physician: Tobie Suzzane POUR, MD    Reason for Procedure:   Abnormal PET scan with abnormal uptake in the proximal transverse colon and distal sigmoid colon, positive Cologuard  Plan:    Colonoscopy  Patient is appropriate for endoscopic procedure(s) in the ambulatory (hospital) setting.  The nature of the procedure, as well as the risks, benefits, and alternatives were carefully and thoroughly reviewed with the patient. Ample time for discussion and questions allowed. The patient understood, was satisfied, and agreed to proceed.     HPI: John [name] is a 68 y.o. male who presents for colonoscopy for evaluation of abnormal PET CT scan and positive Cologuard.  Patient was most recently seen in the Gastroenterology Clinic on 04/28/24.  No interval change in medical history since that appointment. Please refer to that note for full details regarding GI history and clinical presentation.   Past Medical History:  Diagnosis Date   Abdominal aortic aneurysm (AAA) (HCC)    EVAR Dr. Lanis  2023   Alcohol abuse 12/27/2010   Allergic rhinitis, cause unspecified 12/27/2010   Anal warts 04/11/2012   CAD, NATIVE VESSEL 10/04/2010   COPD (chronic obstructive pulmonary disease) (HCC)    Stage 4 uses o2 3 L continous   History of blood transfusion 03/2012   related to nose bleed   HYPERLIPIDEMIA-MIXED 10/04/2010   HYPERTENSION, BENIGN 10/04/2010   Myocardial infarction (HCC) 09/16/2010   x2   On home O2    Pneumonia    when I was a kid   Stented coronary artery    Mid LAD    TOBACCO ABUSE 10/04/2010    Past Surgical History:  Procedure Laterality Date   ABDOMINAL AORTIC ENDOVASCULAR STENT GRAFT N/A 07/28/2022   Procedure: ABDOMINAL AORTIC ENDOVASCULAR STENT GRAFT;  Surgeon: Lanis Fonda BRAVO, MD;  Location: [facility] OR;  Service: Vascular;  Laterality: N/A;   CARDIAC CATHETERIZATION  ~ 2012   CORONARY ANGIOPLASTY WITH STENT  PLACEMENT  09/17/2010; 09/28/2014   1; 2   FINGER SURGERY Left    almost cut off tip of 2nd digit   LEFT HEART CATHETERIZATION WITH CORONARY ANGIOGRAM N/A 09/28/2014   Procedure: LEFT HEART CATHETERIZATION WITH CORONARY ANGIOGRAM;  Surgeon: Lonni JONETTA Cash, MD;  Location: [facility] CATH LAB;  Service: Cardiovascular;  Laterality: N/A;   NASAL ENDOSCOPY WITH EPISTAXIS CONTROL Bilateral 03/2012   ULTRASOUND GUIDANCE FOR VASCULAR ACCESS N/A 07/28/2022   Procedure: ULTRASOUND GUIDANCE FOR VASCULAR ACCESS;  Surgeon: Lanis Fonda BRAVO, MD;  Location: [facility] OR;  Service: Vascular;  Laterality: N/A;   VASECTOMY      Prior to Admission medications   Medication Sig Start Date End Date Taking? Authorizing Provider  albuterol  (PROAIR  HFA) 108 (90 Base) MCG/ACT inhaler Inhale 2 puffs into the lungs every 4 (four) hours as needed for wheezing or shortness of breath. 2 puffs every 4 hours as needed only  if your can't catch your breath 06/12/23  Yes Shellia Oh, MD  albuterol  (PROVENTIL ) (2.5 MG/3ML) 0.083% nebulizer solution Take 3 mLs (2.5 mg total) by nebulization every 4 (four) hours as needed for wheezing or shortness of breath. 03/23/24  Yes Jude Harden GAILS, MD  aspirin  EC 81 MG tablet Take 81 mg by mouth daily.   Yes [provider]  atorvastatin  (LIPITOR ) 40 MG tablet Take 1 tablet (40 mg total) by mouth daily. 12/05/23  Yes Cash Lonni JONETTA, MD  budesonide -glycopyrrolate -formoterol  (BREZTRI  AEROSPHERE) 160-9-4.8 MCG/ACT AERO inhaler Inhale 2  puffs into the lungs in the morning and at bedtime. 04/13/24  Yes Jude Harden GAILS, MD  furosemide  (LASIX ) 40 MG tablet Take 1 tablet (40 mg total) by mouth daily. 11/14/23  Yes Verlin Lonni BIRCH, MD  guaiFENesin  (MUCINEX ) 600 MG 12 hr tablet Take 2 tablets (1,200 mg total) by mouth 2 (two) times daily as needed for cough or to loosen phlegm. 01/19/21  Yes Sood, Vineet, MD  Magnesium  250 MG CAPS Take by mouth daily.   Yes [provider]  Multiple  Vitamin (MULTIVITAMIN WITH MINERALS) TABS tablet Take 1 tablet by mouth daily.   Yes [provider]  OXYGEN  Inhale 2-3 L into the lungs.   Yes [provider]  nitroGLYCERIN  (NITROSTAT ) 0.4 MG SL tablet Place 1 tablet (0.4 mg total) under the tongue every 5 (five) minutes as needed for chest pain. 11/29/22   Verlin Lonni BIRCH, MD    Current Facility-Administered Medications  Medication Dose Route Frequency Provider Last Rate Last Admin   0.9 %  sodium chloride  infusion   Intravenous Continuous McGreal, Inocente HERO, MD        Allergies as of 04/28/2024 - Review Complete 04/28/2024  Allergen Reaction Noted   Penicillins Other (See Comments) 12/27/2010    Family History  Problem Relation Age of Onset   Brain cancer Mother    Heart disease Father     Social History   Socioeconomic History   Marital status: Single    Spouse name: Not on file   Number of children: Not on file   Years of education: Not on file   Highest education level: Not on file  Occupational History   Occupation: Lobbyist maintenance work     Employer: [employer]    Comment: [employer]  Tobacco Use   Smoking status: Former    Current packs/day: 0.00    Average packs/day: 2.0 packs/day for 51.6 years (103.1 ttl pk-yrs)    Types: Cigarettes    Start date: 67    Quit date: 04/18/2020    Years since quitting: 4.1   Smokeless tobacco: Never   Tobacco comments:    2 plus packs per day. He has a 100 + pack - year history of tobacco abuse currently. Former 4 ppd for 25 years.  Vaping Use   Vaping status: Never Used  Substance and Sexual Activity   Alcohol use: Not Currently    Alcohol/week: 144.0 standard drinks of alcohol    Types: 144 Cans of beer per week    Comment:  history 12 pack of beer per night.  2021- drinks 3 beer/day   Drug use: No   Sexual activity: Yes    Birth control/protection: None  Other Topics Concern   Not on file  Social History Narrative   The patient lives  in [place] with his girlfriend. 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   Social Drivers of Corporate investment banker Strain: Low Risk  (09/10/2023)   Overall Financial Resource Strain (CARDIA)    Difficulty of Paying Living Expenses: Not hard at all  Food Insecurity: No Food Insecurity (09/10/2023)   Hunger Vital Sign    Worried About Running Out of Food in the Last Year: Never true    Ran Out of Food in the Last Year: Never true  Transportation Needs: No Transportation Needs (09/10/2023)   PRAPARE - Administrator, Civil Service (Medical): No    Lack  of Transportation (Non-Medical): No  Physical Activity: Inactive (09/10/2023)   Exercise Vital Sign    Days of Exercise per Week: 0 days    Minutes of Exercise per Session: 0 min  Stress: No Stress Concern Present (09/10/2023)   [employer] of Occupational Health - Occupational Stress Questionnaire    Feeling of Stress : Not at all  Social Connections: Moderately Integrated (09/10/2023)   Social Connection and Isolation Panel    Frequency of Communication with Friends and Family: More than three times a week    Frequency of Social Gatherings with Friends and Family: Three times a week    Attends Religious Services: Never    Active Member of Clubs or Organizations: Yes    Attends Banker Meetings: Never    Marital Status: Living with partner  Intimate Partner Violence: Not At Risk (09/10/2023)   Humiliation, Afraid, Rape, and Kick questionnaire    Fear of Current or Ex-Partner: No    Emotionally Abused: No    Physically Abused: No    Sexually Abused: No    Physical Exam: Vital signs in last 24 hours: BP (!) 141/75   Pulse (!) 108   Temp 97.9 F (36.6 C) (Tympanic)   Resp (!) 25   Ht 5' 9 (1.753 m)   Wt 68.5 kg   SpO2 99%   BMI 22.30 kg/m  GEN: NAD EYE: Sclerae anicteric ENT: MMM CV: Non-tachycardic Pulm: No increased WOB GI: Soft NEURO:  Alert &  Oriented   Estefana Kidney, MD [facility]   05/25/2024 11:45 AM

## 2024-05-25 NOTE — Discharge Instructions (Signed)

## 2024-05-25 NOTE — Op Note (Addendum)
 [facility] Patient Name: John [name] Procedure Date: 05/25/2024 MRN: [id number] Attending MD: Rosario Estefana Kidney , , [id number] Date of Birth: [birth year] CSN: [id number] Age: 68 Admit Type: Outpatient Procedure:                Colonoscopy Indications:              Abnormal PET scan of the GI tract, Positive                            Cologuard test Providers:                Rosario Estefana Kidney, Willy Hummer, RN, Corene Southgate, Technician Referring MD:             Suzzane POUR. Patel Medicines:                Monitored Anesthesia Care Complications:            No immediate complications. Estimated Blood Loss:     Estimated blood loss was minimal. Procedure:                Pre-Anesthesia Assessment:                           - Prior to the procedure, a History and Physical                            was performed, and patient medications and                            allergies were reviewed. The patient's tolerance of                            previous anesthesia was also reviewed. The risks                            and benefits of the procedure and the sedation                            options and risks were discussed with the patient.                            All questions were answered, and informed consent                            was obtained. Prior Anticoagulants: The patient has                            taken no anticoagulant or antiplatelet agents. ASA                            Grade Assessment: III - A patient with severe  systemic disease. After reviewing the risks and                            benefits, the patient was deemed in satisfactory                            condition to undergo the procedure.                           After obtaining informed consent, the colonoscope                            was passed under direct vision. Throughout the                            procedure,  the patient's blood pressure, pulse, and                            oxygen  saturations were monitored continuously. The                            [id number] ([id number]) olympus colonscope was                            introduced through the anus and advanced to the the                            terminal ileum. The colonoscopy was performed                            without difficulty. The patient tolerated the                            procedure well. The quality of the bowel                            preparation was good. The terminal ileum, ileocecal                            valve, appendiceal orifice, and rectum were                            photographed. Scope In: 12:09:55 PM Scope Out: 12:56:30 PM Scope Withdrawal Time: 0 hours 16 minutes 32 seconds  Total Procedure Duration: 0 hours 46 minutes 35 seconds  Findings:      The terminal ileum appeared normal.      A frond-like/villous, fungating and infiltrative non-obstructing large       mass was found in the transverse colon (located at 75 cm from the anal       verge). The mass was partially circumferential (involving one-third of       the lumen circumference). The mass measured three cm in length. No       bleeding was present. Biopsies were taken with a cold forceps for       histology. Area distal to  and on the opposite wall of the mass was       tattooed with an injection of Spot (carbon black).      A 40 mm polyp was found in the sigmoid colon (located at 25 cm from the       anal verge). The polyp was semi-pedunculated. These polyps were removed       with a piecemeal technique using a hot snare. Resection and retrieval       were complete. Area distal to and on the opposite wall of the       polypectomy site was tattooed with an injection of 0.5 mL of Spot       (carbon black).      Multiple diverticula were found in the sigmoid colon and descending       colon.      Two sessile polyps were found in the rectum.  The polyps were 8 to 18 mm       in size. These polyps were removed with a hot snare. Resection and       retrieval were complete.      Non-bleeding internal hemorrhoids were found during retroflexion. Impression:               - The examined portion of the ileum was normal.                           - Likely malignant tumor in the transverse colon.                            Biopsied. Tattooed.                           - One 40 mm polyp in the sigmoid colon, removed                            piecemeal using a hot snare. Resected and                            retrieved. Tattooed.                           - Diverticulosis in the sigmoid colon and in the                            descending colon.                           - Two 8 to 18 mm polyps in the rectum, removed with                            a hot snare. Resected and retrieved.                           - Non-bleeding internal hemorrhoids. Moderate Sedation:      Not Applicable - Patient had care per Anesthesia. Recommendation:           - Discharge patient to home (with escort).                           -  Await pathology results. Asked for pathology to                            be rushed.                           - The findings and recommendations were discussed                            with the patient. Procedure Code(s):        --- Professional ---                           [phone], Colonoscopy, flexible; with removal of                            tumor(s), polyp(s), or other lesion(s) by snare                            technique                           45380, 59, Colonoscopy, flexible; with biopsy,                            single or multiple                           45381, Colonoscopy, flexible; with directed                            submucosal injection(s), any substance Diagnosis Code(s):        --- Professional ---                           K64.8, Other hemorrhoids                           D49.0,  Neoplasm of unspecified behavior of                            digestive system                           D12.5, Benign neoplasm of sigmoid colon                           D12.8, Benign neoplasm of rectum                           R19.5, Other fecal abnormalities                           K57.30, Diverticulosis of large intestine without                            perforation or abscess without bleeding  R93.3, Abnormal findings on diagnostic imaging of                            other parts of digestive tract CPT copyright 2022 American Medical Association. All rights reserved. The codes documented in this report are preliminary and upon coder review may  be revised to meet current compliance requirements. Dr Estefana Federico Rosario Estefana Federico,  05/25/2024 1:09:49 PM Number of Addenda: 0

## 2024-05-25 NOTE — Anesthesia Preprocedure Evaluation (Addendum)
 Anesthesia Evaluation  Patient identified by MRN, date of birth, ID band Patient awake    Reviewed: Allergy & Precautions, NPO status , Patient's Chart, lab work & pertinent test results  Airway Mallampati: III  TM Distance: >3 FB Neck ROM: Full    Dental  (+) Missing, Poor Dentition, Chipped   Pulmonary COPD,  COPD inhaler and oxygen  dependent, former smoker   Pulmonary exam normal        Cardiovascular hypertension, + CAD, + Past MI, + Cardiac Stents and + Peripheral Vascular Disease  Normal cardiovascular exam     Neuro/Psych  PSYCHIATRIC DISORDERS Anxiety        GI/Hepatic   Endo/Other    Renal/GU      Musculoskeletal   Abdominal   Peds  Hematology   Anesthesia Other Findings abnormal PET scan  Reproductive/Obstetrics                              Anesthesia Physical Anesthesia Plan  ASA: 4  Anesthesia Plan: MAC   Post-op Pain Management:    Induction:   PONV Risk Score and Plan: 1 and Propofol  infusion and Treatment may vary due to age or medical condition  Airway Management Planned: Simple Face Mask  Additional Equipment:   Intra-op Plan:   Post-operative Plan:   Informed Consent: I have reviewed the patients History and Physical, chart, labs and discussed the procedure including the risks, benefits and alternatives for the proposed anesthesia with the patient or authorized representative who has indicated his/her understanding and acceptance.     Dental advisory given  Plan Discussed with: CRNA  Anesthesia Plan Comments:          Anesthesia Quick Evaluation

## 2024-05-26 ENCOUNTER — Encounter (HOSPITAL_COMMUNITY)

## 2024-05-26 NOTE — Anesthesia Postprocedure Evaluation (Signed)
 Anesthesia Post Note  Patient: John [name]  Procedure(s) Performed: COLONOSCOPY     Patient location during evaluation: Endoscopy Anesthesia Type: MAC Level of consciousness: awake Pain management: pain level controlled Vital Signs Assessment: post-procedure vital signs reviewed and stable Respiratory status: spontaneous breathing, nonlabored ventilation and respiratory function stable Cardiovascular status: blood pressure returned to baseline and stable Postop Assessment: no apparent nausea or vomiting Anesthetic complications: no   No notable events documented.  Last Vitals:  Vitals:   05/25/24 1320 05/25/24 1323  BP: (!) 113/57 130/69  Pulse: 97 99  Resp: (!) 26 (!) 26  Temp:    SpO2: 100% 100%    Last Pain:  Vitals:   05/25/24 1323  TempSrc:   PainSc: 0-No pain                 Clarnce Homan P Jerrard Bradburn

## 2024-05-27 ENCOUNTER — Ambulatory Visit: Admitting: Pulmonary Disease

## 2024-05-27 DIAGNOSIS — R911 Solitary pulmonary nodule: Secondary | ICD-10-CM | POA: Diagnosis not present

## 2024-05-27 LAB — PULMONARY FUNCTION TEST
DL/VA % pred: 34 %
DL/VA: 1.42 ml/min/mmHg/L
DLCO unc % pred: 19 %
DLCO unc: 5.45 ml/min/mmHg
FEF 25-75 Pre: 0.23 L/s
FEF2575-%Pred-Pre: 8 %
FEV1-%Pred-Pre: 16 %
FEV1-Pre: 0.57 L
FEV1FVC-%Pred-Pre: 44 %
FEV6-%Pred-Pre: 35 %
FEV6-Pre: 1.6 L
FEV6FVC-%Pred-Pre: 98 %
FVC-%Pred-Pre: 36 %
FVC-Pre: 1.71 L
Pre FEV1/FVC ratio: 33 %
Pre FEV6/FVC Ratio: 94 %

## 2024-05-27 NOTE — Patient Instructions (Signed)
Spiro/ DLCO completed today  

## 2024-05-27 NOTE — Progress Notes (Signed)
Spiro/ DLCO completed today  

## 2024-05-28 ENCOUNTER — Ambulatory Visit: Payer: Self-pay | Admitting: Internal Medicine

## 2024-05-28 DIAGNOSIS — C184 Malignant neoplasm of transverse colon: Secondary | ICD-10-CM

## 2024-05-28 DIAGNOSIS — C187 Malignant neoplasm of sigmoid colon: Secondary | ICD-10-CM

## 2024-05-28 NOTE — Progress Notes (Signed)
 Spoke to the patient about the results of his colonoscopy, which showed colon cancer both in the transverse colon mass and the large sigmoid colon polyp. I reviewed his recent PET CT scan and there are no obvious signs of metastasis to other areas of his body. These two colon cancers may represents two primary sites of colon cancer development. Based on this, he may be a good surgical candidate. Patient is agreeable to being referred to surgery.  Pod B triage, let's send an urgent referral to colorectal surgery with [facility] to either Dr. Debby or Dr. Teresa please. Thanks.

## 2024-05-31 ENCOUNTER — Encounter (HOSPITAL_COMMUNITY)

## 2024-05-31 LAB — SURGICAL PATHOLOGY

## 2024-06-02 ENCOUNTER — Encounter (HOSPITAL_COMMUNITY)

## 2024-06-02 ENCOUNTER — Encounter (HOSPITAL_COMMUNITY): Payer: Self-pay | Admitting: *Deleted

## 2024-06-02 DIAGNOSIS — J439 Emphysema, unspecified: Secondary | ICD-10-CM

## 2024-06-02 DIAGNOSIS — J4489 Other specified chronic obstructive pulmonary disease: Secondary | ICD-10-CM

## 2024-06-02 NOTE — Progress Notes (Unsigned)
 No chief complaint on file.  History of Present Illness: 68 yo male with history of CAD , HTN, hyperlipidemia, chronic diastolic CHF and severe COPD who is here today for cardiac follow up. He was admitted to [facility] December 2011 with a NSTEMI. Cardiac cath 09/17/10 and was found to have a severe stenosis of the LAD which was treated with a drug eluting stent. He was also found to have moderate disease in the RCA (70%) and Circumflex (70%) managed medically. Cardiac cath June 2012 with stable CAD. He  had 3 admissions in July 2013 with severe epistaxis causing hemorrhagic shock with tachycardia and drop in blood pressure. This was treated with packing and transfusion. His Plavix  was stopped. He was admitted January 2016 with unstable angina. Cardiac cath January 2016 with severe mid LAD stenosis, severe mid Circumflex stenosis. I placed a drug eluting stent in the mid LAD and a drug eluting stent in the mid Circumflex. LV function normal. I saw him in May 2016 and he c/o fatigue and dyspnea. His Coreg  was stopped and he had resolution of his symptoms. He missed follow up appointments in 2017-2020 due to lack of insurance and cost. I saw him February 2020 with c/o dyspnea and lower extremity edema. He had been off of all medications due to losing his job and having no money. He responded well to Lasix . He has established in our pulmonary office with Dr. Shellia. He was admitted to [facility] July 2021 with acute respiratory failure secondary to COPD exacerbation and community acquired pneumonia and was intubated. He was euvolemic on admission per notes but developed LE edema during his hospital stay. He was discharged on Lasix  40 mg po BID. Echo 04/19/20 with LVEF=65-70%. No valve disease. He has stopped smoking. He is on supplemental O2 24 hours per day now. He was seen at [facility] July 2022 and told he was not a candidate for lung transplant. Cardiac cath at [facility] July 2022 with 60% mid LAD stenosis, patent  LAD stents, patent Circumflex stent. Echo July 2022 at [facility] with normal LV function and no valve disease. CTA abdomen at [facility] with 6.9 cm AAA. He was seen by Vascular surgery there and turned down for intervention. He was told he had 6 months to live and was made DNR and placed on palliative care. He was doing well overall when I saw him in September 2023. He was admitted to [facility] November 2023 with abdominal pain and his AAA had ruptured. He underwent endovascular aortic repair per Dr. Silver. He has done well since then.   He is here today for follow up. The patient denies any chest pain, palpitations, lower extremity edema, orthopnea, PND, dizziness, near syncope or syncope. He has baseline dyspnea.    Primary Care Physician: Tobie Suzzane POUR, MD  Past Medical History:  Diagnosis Date   Abdominal aortic aneurysm (AAA) [facility])    EVAR Dr. Lanis  2023   Alcohol abuse 12/27/2010   Allergic rhinitis, cause unspecified 12/27/2010   Anal warts 04/11/2012   CAD, NATIVE VESSEL 10/04/2010   COPD (chronic obstructive pulmonary disease) (HCC)    Stage 4 uses o2 3 L continous   History of blood transfusion 03/2012   related to nose bleed   HYPERLIPIDEMIA-MIXED 10/04/2010   HYPERTENSION, BENIGN 10/04/2010   Myocardial infarction (HCC) 09/16/2010   x2   On home O2    Pneumonia    when I was a kid   Stented coronary artery  Mid LAD    TOBACCO ABUSE 10/04/2010    Past Surgical History:  Procedure Laterality Date   ABDOMINAL AORTIC ENDOVASCULAR STENT GRAFT N/A 07/28/2022   Procedure: ABDOMINAL AORTIC ENDOVASCULAR STENT GRAFT;  Surgeon: Lanis Fonda BRAVO, MD;  Location: [facility] OR;  Service: Vascular;  Laterality: N/A;   CARDIAC CATHETERIZATION  ~ 2012   COLONOSCOPY N/A 05/25/2024   Procedure: COLONOSCOPY;  Surgeon: [name], MD;  Location: [facility] ENDOSCOPY;  Service: Gastroenterology;  Laterality: N/A;   CORONARY ANGIOPLASTY WITH STENT PLACEMENT  09/17/2010; 09/28/2014   1; 2   FINGER SURGERY  Left    almost cut off tip of 2nd digit   LEFT HEART CATHETERIZATION WITH CORONARY ANGIOGRAM N/A 09/28/2014   Procedure: LEFT HEART CATHETERIZATION WITH CORONARY ANGIOGRAM;  Surgeon: Lonni JONETTA Cash, MD;  Location: [facility] CATH LAB;  Service: Cardiovascular;  Laterality: N/A;   NASAL ENDOSCOPY WITH EPISTAXIS CONTROL Bilateral 03/2012   ULTRASOUND GUIDANCE FOR VASCULAR ACCESS N/A 07/28/2022   Procedure: ULTRASOUND GUIDANCE FOR VASCULAR ACCESS;  Surgeon: Lanis Fonda BRAVO, MD;  Location: [facility] OR;  Service: Vascular;  Laterality: N/A;   VASECTOMY      Current Outpatient Medications  Medication Sig Dispense Refill   albuterol  (PROAIR  HFA) 108 (90 Base) MCG/ACT inhaler Inhale 2 puffs into the lungs every 4 (four) hours as needed for wheezing or shortness of breath. 2 puffs every 4 hours as needed only  if your can't catch your breath 8 g 5   albuterol  (PROVENTIL ) (2.5 MG/3ML) 0.083% nebulizer solution Take 3 mLs (2.5 mg total) by nebulization every 4 (four) hours as needed for wheezing or shortness of breath. 360 mL 5   aspirin  EC 81 MG tablet Take 81 mg by mouth daily.     atorvastatin  (LIPITOR ) 40 MG tablet Take 1 tablet (40 mg total) by mouth daily. 90 tablet 3   budesonide -glycopyrrolate -formoterol  (BREZTRI  AEROSPHERE) 160-9-4.8 MCG/ACT AERO inhaler Inhale 2 puffs into the lungs in the morning and at bedtime. 10.7 g 11   furosemide  (LASIX ) 40 MG tablet Take 1 tablet (40 mg total) by mouth daily. 90 tablet 2   guaiFENesin  (MUCINEX ) 600 MG 12 hr tablet Take 2 tablets (1,200 mg total) by mouth 2 (two) times daily as needed for cough or to loosen phlegm.     Magnesium  250 MG CAPS Take by mouth daily.     Multiple Vitamin (MULTIVITAMIN WITH MINERALS) TABS tablet Take 1 tablet by mouth daily.     nitroGLYCERIN  (NITROSTAT ) 0.4 MG SL tablet Place 1 tablet (0.4 mg total) under the tongue every 5 (five) minutes as needed for chest pain. 25 tablet 6   OXYGEN  Inhale 2-3 L into the lungs.     No current  facility-administered medications for this visit.    Allergies  Allergen Reactions   Penicillins Other (See Comments)    Passes out    Social History   Socioeconomic History   Marital status: Single    Spouse name: Not on file   Number of children: Not on file   Years of education: Not on file   Highest education level: Not on file  Occupational History   Occupation: Lobbyist maintenance work     Employer: [employer]    Comment: [employer]  Tobacco Use   Smoking status: Former    Current packs/day: 0.00    Average packs/day: 2.0 packs/day for 51.6 years (103.1 ttl pk-yrs)    Types: Cigarettes    Start date: 1970    Quit  date: 04/18/2020    Years since quitting: 4.1   Smokeless tobacco: Never   Tobacco comments:    2 plus packs per day. He has a 100 + pack - year history of tobacco abuse currently. Former 4 ppd for 25 years.  Vaping Use   Vaping status: Never Used  Substance and Sexual Activity   Alcohol use: Not Currently    Alcohol/week: 144.0 standard drinks of alcohol    Types: 144 Cans of beer per week    Comment:  history 12 pack of beer per night.  2021- drinks 3 beer/day   Drug use: No   Sexual activity: Yes    Birth control/protection: None  Other Topics Concern   Not on file  Social History Narrative   The patient lives in [place] with his girlfriend. 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   Social Drivers of Corporate investment banker Strain: Low Risk  (09/10/2023)   Overall Financial Resource Strain (CARDIA)    Difficulty of Paying Living Expenses: Not hard at all  Food Insecurity: No Food Insecurity (09/10/2023)   Hunger Vital Sign    Worried About Running Out of Food in the Last Year: Never true    Ran Out of Food in the Last Year: Never true  Transportation Needs: No Transportation Needs (09/10/2023)   PRAPARE - Administrator, Civil Service (Medical): No    Lack of Transportation (Non-Medical): No   Physical Activity: Inactive (09/10/2023)   Exercise Vital Sign    Days of Exercise per Week: 0 days    Minutes of Exercise per Session: 0 min  Stress: No Stress Concern Present (09/10/2023)   [employer] of Occupational Health - Occupational Stress Questionnaire    Feeling of Stress : Not at all  Social Connections: Moderately Integrated (09/10/2023)   Social Connection and Isolation Panel    Frequency of Communication with Friends and Family: More than three times a week    Frequency of Social Gatherings with Friends and Family: Three times a week    Attends Religious Services: Never    Active Member of Clubs or Organizations: Yes    Attends Banker Meetings: Never    Marital Status: Living with partner  Intimate Partner Violence: Not At Risk (09/10/2023)   Humiliation, Afraid, Rape, and Kick questionnaire    Fear of Current or Ex-Partner: No    Emotionally Abused: No    Physically Abused: No    Sexually Abused: No    Family History  Problem Relation Age of Onset   Brain cancer Mother    Heart disease Father     Review of Systems:  As stated in the HPI and otherwise negative.   There were no vitals taken for this visit.  Physical Examination: General: Well developed, well nourished, NAD  HEENT: OP clear, mucus membranes moist  SKIN: warm, dry. No rashes. Neuro: No focal deficits  Musculoskeletal: Muscle strength 5/5 all ext  Psychiatric: Mood and affect normal  Neck: No JVD, no carotid bruits, no thyromegaly, no lymphadenopathy.  Lungs:Clear bilaterally, no wheezes, rhonci, crackles Cardiovascular: Regular rate and rhythm. No murmurs, gallops or rubs. Abdomen:Soft. Bowel sounds present. Non-tender.  Extremities: No lower extremity edema. Pulses are 2 + in the bilateral DP/PT.  Echo 04/19/20:  1. Left ventricular ejection fraction, by estimation, is 65 to 70%. The  left ventricle has normal function. The left ventricle has no regional  wall motion  abnormalities. Left ventricular diastolic parameters were  normal.   2. Right ventricular systolic function is normal. The right ventricular  size is normal. There is normal pulmonary artery systolic pressure.   3. The mitral valve is normal in structure. No evidence of mitral valve  regurgitation. No evidence of mitral stenosis.   4. The aortic valve is tricuspid. Aortic valve regurgitation is not  visualized. No aortic stenosis is present.   5. The inferior vena cava is normal in size with greater than 50%  respiratory variability, suggesting right atrial pressure of 3 mmHg.   Echo, Cath reports from [facility] are in Care Everywhere  EKG:  EKG is ordered today. The ekg ordered today demonstrates   Recent Labs: 10/21/2023: ALT 24; BUN 17; Creatinine, Ser 1.12; Hemoglobin 12.9; Platelets 198; Potassium 4.5; Sodium 145   Lipid Panel    Component Value Date/Time   CHOL 178 10/21/2023 1341   TRIG 57 10/21/2023 1341   TRIG 82 08/15/2006 0929   HDL 86 10/21/2023 1341   CHOLHDL 2.1 10/21/2023 1341   CHOLHDL 2.3 10/10/2020 0845   VLDL 12 10/10/2020 0845   LDLCALC 81 10/21/2023 1341     Wt Readings from Last 3 Encounters:  05/25/24 151 lb (68.5 kg)  05/11/24 150 lb (68 kg)  04/28/24 151 lb 6 oz (68.7 kg)    Assessment and Plan:   1. CAD without angina: Cath at [facility] in July 2022 during workup for possible lung transplant. Moderate mid LAD stenosis with patent LAD and Circumflex stents. LV function normal by echo July 2022 at [facility] with no valve disease. He does not tolerate beta blockers due to fatigue. He is DNR secondary to his advanced lung disease. No chest pain. No plans for future invasive cardiac testing.  Continue ASA and statin        2. Tobacco abuse, in remission: He is no longer smoking  3. HTN: BP is well controlled. Continue current therapy  4. Hyperlipidemia: LDL near goal in January 2025. Will not repeat lipids given his poor prognosis with advanced lung disease. LFTs  normal in January 2025.  Continue Lipitor . No need to push his statin given his poor prognosis.   5. Chronic diastolic CHF: LV function normal by echo July 2022. No volume overload on exam. Continue Lasix .   6. Dyspnea/COPD: He is followed in the pulmonary office. Now on continuous supplemental O2. Not a candidate for lung transplant. He is now DNR and palliative care is following  7. AAA: He was admitted to [facility] in November 2023 with rupture of his AAA and had endovascular repair per Dr. Silver.   Labs/ tests ordered today include:  No orders of the defined types were placed in this encounter.  Disposition:   F/U with me in 6 months  Signed, Lonni Cash, MD 06/02/2024 1:09 PM    [facility] [address] [place], [place], [place]  [address] Phone: [phone]; Fax: [phone]

## 2024-06-02 NOTE — Progress Notes (Signed)
 Pulmonary Individual Treatment Plan  Patient Details  Name: John [name] MRN: [id number] Date of Birth: 1955-11-05 Referring Provider:   Flowsheet Row PULMONARY REHAB COPD ORIENTATION from 02/02/2024 in [facility] CARDIAC REHABILITATION  Referring Provider [name] MD    Initial Encounter Date:  Flowsheet Row PULMONARY REHAB COPD ORIENTATION from 02/02/2024 in [facility] [facility]  Date 02/02/24    Visit Diagnosis: Chronic bronchitis with COPD (chronic obstructive pulmonary disease) (HCC)  Pulmonary emphysema, unspecified emphysema type (HCC)  Patient's Home Medications on Admission:   Current Outpatient Medications:    albuterol  (PROAIR  HFA) 108 (90 Base) MCG/ACT inhaler, Inhale 2 puffs into the lungs every 4 (four) hours as needed for wheezing or shortness of breath. 2 puffs every 4 hours as needed only  if your can't catch your breath, Disp: 8 g, Rfl: 5   albuterol  (PROVENTIL ) (2.5 MG/3ML) 0.083% nebulizer solution, Take 3 mLs (2.5 mg total) by nebulization every 4 (four) hours as needed for wheezing or shortness of breath., Disp: 360 mL, Rfl: 5   aspirin  EC 81 MG tablet, Take 81 mg by mouth daily., Disp: , Rfl:    atorvastatin  (LIPITOR ) 40 MG tablet, Take 1 tablet (40 mg total) by mouth daily., Disp: 90 tablet, Rfl: 3   budesonide -glycopyrrolate -formoterol  (BREZTRI  AEROSPHERE) 160-9-4.8 MCG/ACT AERO inhaler, Inhale 2 puffs into the lungs in the morning and at bedtime., Disp: 10.7 g, Rfl: 11   furosemide  (LASIX ) 40 MG tablet, Take 1 tablet (40 mg total) by mouth daily., Disp: 90 tablet, Rfl: 2   guaiFENesin  (MUCINEX ) 600 MG 12 hr tablet, Take 2 tablets (1,200 mg total) by mouth 2 (two) times daily as needed for cough or to loosen phlegm., Disp: , Rfl:    Magnesium  250 MG CAPS, Take by mouth daily., Disp: , Rfl:    Multiple Vitamin (MULTIVITAMIN WITH MINERALS) TABS tablet, Take 1 tablet by mouth daily., Disp: , Rfl:    nitroGLYCERIN  (NITROSTAT ) 0.4 MG SL tablet,  Place 1 tablet (0.4 mg total) under the tongue every 5 (five) minutes as needed for chest pain., Disp: 25 tablet, Rfl: 6   OXYGEN , Inhale 2-3 L into the lungs., Disp: , Rfl:   Past Medical History: Past Medical History:  Diagnosis Date   Abdominal aortic aneurysm (AAA) (HCC)    EVAR Dr. Lanis  2023   Alcohol abuse 12/27/2010   Allergic rhinitis, cause unspecified 12/27/2010   Anal warts 04/11/2012   CAD, NATIVE VESSEL 10/04/2010   COPD (chronic obstructive pulmonary disease) (HCC)    Stage 4 uses o2 3 L continous   History of blood transfusion 03/2012   related to nose bleed   HYPERLIPIDEMIA-MIXED 10/04/2010   HYPERTENSION, BENIGN 10/04/2010   Myocardial infarction (HCC) 09/16/2010   x2   On home O2    Pneumonia    when I was a kid   Stented coronary artery    Mid LAD    TOBACCO ABUSE 10/04/2010    Tobacco Use: Social History   Tobacco Use  Smoking Status Former   Current packs/day: 0.00   Average packs/day: 2.0 packs/day for 51.6 years (103.1 ttl pk-yrs)   Types: Cigarettes   Start date: 62   Quit date: 04/18/2020   Years since quitting: 4.1  Smokeless Tobacco Never  Tobacco Comments   2 plus packs per day. He has a 100 + pack - year history of tobacco abuse currently. Former 4 ppd for 25 years.    Labs: Review Flowsheet  More data exists  Latest Ref Rng & Units 02/12/2021 03/22/2022 07/28/2022 10/14/2022 10/21/2023  Labs for ITP Cardiac and Pulmonary Rehab  Cholestrol 100 - 199 mg/dL - 833  - 840  821   LDL (calc) 0 - 99 mg/dL - 76  - 72  81   HDL-C >39 mg/dL - 77  - 76  86   Trlycerides 0 - 149 mg/dL - 70  - 55  57   PH, Arterial 7.35 - 7.45 7.283  - 7.265  - -  PCO2 arterial 32 - 48 mmHg 80.9  - 80.0  - -  Bicarbonate 20.0 - 28.0 mmol/L 31.4  - 36.7  - -  TCO2 22 - 32 mmol/L - - 39  - -  O2 Saturation % 97.2  - 97  - -    Capillary Blood Glucose: Lab Results  Component Value Date   GLUCAP 185 (H) 04/25/2020   GLUCAP 80 04/25/2020   GLUCAP 80  04/25/2020   GLUCAP 131 (H) 04/24/2020   GLUCAP 176 (H) 04/24/2020     Pulmonary Assessment Scores:  Pulmonary Assessment Scores     Row Name 02/02/24 1640         ADL UCSD   ADL Phase Entry     SOB Score total 60     Rest 0     Walk 3     Stairs 4     Bath 4     Dress 2     Shop 3       CAT Score   CAT Score 17       mMRC Score   mMRC Score 4       UCSD: Self-administered rating of dyspnea associated with activities of daily living (ADLs) 6-point scale (0 = not at all to 5 = maximal or unable to do because of breathlessness)  Scoring Scores range from 0 to 120.  Minimally important difference is 5 units  CAT: CAT can identify the health impairment of COPD patients and is better correlated with disease progression.  CAT has a scoring range of zero to 40. The CAT score is classified into four groups of low (less than 10), medium (10 - 20), high (21-30) and very high (31-40) based on the impact level of disease on health status. A CAT score over 10 suggests significant symptoms.  A worsening CAT score could be explained by an exacerbation, poor medication adherence, poor inhaler technique, or progression of COPD or comorbid conditions.  CAT MCID is 2 points  mMRC: mMRC (Modified Medical Research Council) Dyspnea Scale is used to assess the degree of baseline functional disability in patients of respiratory disease due to dyspnea. No minimal important difference is established. A decrease in score of 1 point or greater is considered a positive change.   Pulmonary Function Assessment:  Pulmonary Function Assessment - 02/02/24 1643       Initial Spirometry Results   FVC% 34 %    FEV1% 58 %    FEV1/FVC Ratio 35    Comments test 12/09/19      Post Bronchodilator Spirometry Results   FVC% 33 %    FEV1% 59 %    FEV1/FVC Ratio 37    Comments test 12/09/19      Breath   Shortness of Breath Yes;Fear of Shortness of Breath;Limiting activity;Panic with Shortness of  Breath          Exercise Target Goals: Exercise Program Goal: Individual exercise prescription set using results from initial 6  min walk test and THRR while considering  patient's activity barriers and safety.   Exercise Prescription Goal: Initial exercise prescription builds to 30-45 minutes a day of aerobic activity, 2-3 days per week.  Home exercise guidelines will be given to patient during program as part of exercise prescription that the participant will acknowledge.  Activity Barriers & Risk Stratification:  Activity Barriers & Cardiac Risk Stratification - 01/30/24 0924       Activity Barriers & Cardiac Risk Stratification   Activity Barriers Deconditioning;Muscular Weakness;Shortness of Breath          6 Minute Walk:  6 Minute Walk     Row Name 02/02/24 1630         6 Minute Walk   Phase Initial     Distance 300 feet     Walk Time 3.33 minutes     # of Rest Breaks 5  21 sec, 36 sec, 20 sec, 53 sec, 30 sec (all standing rest)     MPH 1.02     METS 2.08     RPE 15     Perceived Dyspnea  3     VO2 Peak 7.3     Symptoms Yes (comment)     Comments SOB, chest tightness 6/10 hard to breathe     Resting HR 98 bpm     Resting BP 126/74     Resting Oxygen  Saturation  98 %     Exercise Oxygen  Saturation  during 6 min walk 93 %     Max Ex. HR 125 bpm     Max Ex. BP 142/84     2 Minute Post BP 132/64       Interval HR   1 Minute HR 125     2 Minute HR 117     3 Minute HR 109     4 Minute HR 114     5 Minute HR 110     6 Minute HR 112     2 Minute Post HR 105     Interval Heart Rate? Yes       Interval Oxygen    Interval Oxygen ? Yes     Baseline Oxygen  Saturation % 98 %  3L NCC     1 Minute Oxygen  Saturation % 95 %     1 Minute Liters of Oxygen  4 L  NCC     2 Minute Oxygen  Saturation % 97 %     2 Minute Liters of Oxygen  4 L     3 Minute Oxygen  Saturation % 95 %     3 Minute Liters of Oxygen  4 L     4 Minute Oxygen  Saturation % 97 %     4 Minute Liters  of Oxygen  4 L     5 Minute Oxygen  Saturation % 98 %     5 Minute Liters of Oxygen  4 L     6 Minute Oxygen  Saturation % 93 %     6 Minute Liters of Oxygen  4 L     2 Minute Post Oxygen  Saturation % 97 %     2 Minute Post Liters of Oxygen  4 L        Oxygen  Initial Assessment:  Oxygen  Initial Assessment - 01/30/24 0921       Home Oxygen    Home Oxygen  Device Home Concentrator;E-Tanks;Portable Concentrator    Sleep Oxygen  Prescription Continuous   ventilator machine for 5 hrs a day   Liters per minute 3    Home  Exercise Oxygen  Prescription Continuous    Liters per minute 3   4 liters for shower and walking outside (up hill)   Home Resting Oxygen  Prescription Continuous    Liters per minute 3    Compliance with Home Oxygen  Use Yes   usually compliant     Initial 6 min Walk   Oxygen  Used E-Tanks;Continuous    Liters per minute 4      Program Oxygen  Prescription   Program Oxygen  Prescription E-Tanks;Continuous    Liters per minute 4      Intervention   Short Term Goals To learn and exhibit compliance with exercise, home and travel O2 prescription;To learn and understand importance of monitoring SPO2 with pulse oximeter and demonstrate accurate use of the pulse oximeter.;To learn and understand importance of maintaining oxygen  saturations>88%;To learn and demonstrate proper pursed lip breathing techniques or other breathing techniques. ;To learn and demonstrate proper use of respiratory medications    Long  Term Goals Exhibits compliance with exercise, home  and travel O2 prescription;Maintenance of O2 saturations>88%;Compliance with respiratory medication;Verbalizes importance of monitoring SPO2 with pulse oximeter and return demonstration;Exhibits proper breathing techniques, such as pursed lip breathing or other method taught during program session;Demonstrates proper use of MDI's          Oxygen  Re-Evaluation:  Oxygen  Re-Evaluation     Row Name 03/29/24 0936 04/12/24 1152  05/19/24 1019         Program Oxygen  Prescription   Program Oxygen  Prescription E-Tanks;Continuous E-Tanks;Continuous E-Tanks;Continuous     Liters per minute 3 3 3        Home Oxygen    Home Oxygen  Device Home Concentrator;E-Tanks;Portable Concentrator Home Concentrator;E-Tanks;Portable Concentrator Home Concentrator;E-Tanks;Portable Concentrator     Sleep Oxygen  Prescription -- Continuous Continuous     Liters per minute 3 3 3      Home Exercise Oxygen  Prescription Continuous Continuous Continuous     Liters per minute 3 3 3      Home Resting Oxygen  Prescription Continuous Continuous Continuous     Liters per minute 3 3 3      Compliance with Home Oxygen  Use Yes Yes Yes       Goals/Expected Outcomes   Short Term Goals To learn and exhibit compliance with exercise, home and travel O2 prescription;To learn and understand importance of monitoring SPO2 with pulse oximeter and demonstrate accurate use of the pulse oximeter.;To learn and understand importance of maintaining oxygen  saturations>88%;To learn and demonstrate proper pursed lip breathing techniques or other breathing techniques. ;To learn and demonstrate proper use of respiratory medications -- To learn and exhibit compliance with exercise, home and travel O2 prescription;To learn and understand importance of monitoring SPO2 with pulse oximeter and demonstrate accurate use of the pulse oximeter.;To learn and understand importance of maintaining oxygen  saturations>88%;To learn and demonstrate proper pursed lip breathing techniques or other breathing techniques. ;To learn and demonstrate proper use of respiratory medications     Long  Term Goals Exhibits compliance with exercise, home  and travel O2 prescription;Maintenance of O2 saturations>88%;Compliance with respiratory medication;Verbalizes importance of monitoring SPO2 with pulse oximeter and return demonstration;Exhibits proper breathing techniques, such as pursed lip breathing or other  method taught during program session;Demonstrates proper use of MDI's -- Exhibits compliance with exercise, home  and travel O2 prescription;Maintenance of O2 saturations>88%;Compliance with respiratory medication;Verbalizes importance of monitoring SPO2 with pulse oximeter and return demonstration;Exhibits proper breathing techniques, such as pursed lip breathing or other method taught during program session;Demonstrates proper use of MDI's  Comments John [name]'s breathing has been about the same since last goal check-in. He states with the heat it has been much more difficult and he doesn't go outside when it's hot. He is compliant with his oxygen  at 3L all the time. He takes all his respiratory medications as prescribed. He states when he gets SOB a lot of times he will just have to sit still and wait for his HR to go down. Encouraged to use the PLB to help also. John [name]'s breathing continues to be the same since the last goal check in. He stated that he has good days and bad days. The heat/humidity has made it difficult the past month and only goes outside if he has to. He is compliant with 3L and does his PLB when he feels SOB. John [name] is compliant with his oxygen  around the clock.  He is not active at home but was encouraged to do more to help with breathing.  He does use his PLB and it is helpful.  He does not use nebulizer daily and only when he needs it.  He was encouraged to use it daily and then extra when he needs it.  His sats are doing well.     Goals/Expected Outcomes Short: Continue to attend rehab. Long: Use PLB to help with SOB. Short: Continue to attend rehab. Long: Use PLB to help with SOB. Short: Continue to use PLB routinely Long: Use nebulizer more consistently        Oxygen  Discharge (Final Oxygen  Re-Evaluation):  Oxygen  Re-Evaluation - 05/19/24 1019       Program Oxygen  Prescription   Program Oxygen  Prescription E-Tanks;Continuous    Liters per minute 3      Home Oxygen    Home Oxygen   Device Home Concentrator;E-Tanks;Portable Concentrator    Sleep Oxygen  Prescription Continuous    Liters per minute 3    Home Exercise Oxygen  Prescription Continuous    Liters per minute 3    Home Resting Oxygen  Prescription Continuous    Liters per minute 3    Compliance with Home Oxygen  Use Yes      Goals/Expected Outcomes   Short Term Goals To learn and exhibit compliance with exercise, home and travel O2 prescription;To learn and understand importance of monitoring SPO2 with pulse oximeter and demonstrate accurate use of the pulse oximeter.;To learn and understand importance of maintaining oxygen  saturations>88%;To learn and demonstrate proper pursed lip breathing techniques or other breathing techniques. ;To learn and demonstrate proper use of respiratory medications    Long  Term Goals Exhibits compliance with exercise, home  and travel O2 prescription;Maintenance of O2 saturations>88%;Compliance with respiratory medication;Verbalizes importance of monitoring SPO2 with pulse oximeter and return demonstration;Exhibits proper breathing techniques, such as pursed lip breathing or other method taught during program session;Demonstrates proper use of MDI's    Comments John [name] is compliant with his oxygen  around the clock.  He is not active at home but was encouraged to do more to help with breathing.  He does use his PLB and it is helpful.  He does not use nebulizer daily and only when he needs it.  He was encouraged to use it daily and then extra when he needs it.  His sats are doing well.    Goals/Expected Outcomes Short: Continue to use PLB routinely Long: Use nebulizer more consistently          Initial Exercise Prescription:  Initial Exercise Prescription - 02/02/24 1600       Date of Initial Exercise RX and  Referring Provider   Date 02/02/24    Referring Provider [name] MD      Oxygen    Oxygen  Continuous    Liters 4    Maintain Oxygen  Saturation 88% or higher      NuStep    Level 1    SPM 60    Minutes 30    METs 1.2      Track   Laps 2    Minutes 5    METs 1      Prescription Details   Frequency (times per week) 2    Duration Progress to 30 minutes of continuous aerobic without signs/symptoms of physical distress      Intensity   THRR 40-80% of Max Heartrate 120-142    Ratings of Perceived Exertion 11-13    Perceived Dyspnea 0-4      Progression   Progression Continue to progress workloads to maintain intensity without signs/symptoms of physical distress.      Resistance Training   Training Prescription Yes    Weight 3 lb    Reps 10-15          Perform Capillary Blood Glucose checks as needed.  Exercise Prescription Changes:   Exercise Prescription Changes     Row Name 02/02/24 1600 02/04/24 1300 02/18/24 1300 03/01/24 1500 03/29/24 1500     Response to Exercise   Blood Pressure (Admit) 126/74 128/58 110/60 128/74 126/64   Blood Pressure (Exercise) 142/84 134/70 120/60 130/60 --   Blood Pressure (Exit) 132/72 120/62 112/66 134/60 108/64   Heart Rate (Admit) 98 bpm 106 bpm 110 bpm 120 bpm 121 bpm   Heart Rate (Exercise) 125 bpm 106 bpm 116 bpm 124 bpm 109 bpm   Heart Rate (Exit) 104 bpm 100 bpm 116 bpm 120 bpm 111 bpm   Oxygen  Saturation (Admit) 98 % 94 % 91 % 90 % 91 %   Oxygen  Saturation (Exercise) 93 % 100 % 96 % 91 % 93 %   Oxygen  Saturation (Exit) 97 % 98 % 94 % 93 % 95 %   Rating of Perceived Exertion (Exercise) 15 15 14 15 13    Perceived Dyspnea (Exercise) 3 2 3 3 3    Symptoms standing rest breaks, SOB, chest tightness 6/10 (normal) -- -- -- --   Comments walk test results -- -- -- --   Duration -- Continue with 30 min of aerobic exercise without signs/symptoms of physical distress. Continue with 30 min of aerobic exercise without signs/symptoms of physical distress. Continue with 30 min of aerobic exercise without signs/symptoms of physical distress. Continue with 30 min of aerobic exercise without signs/symptoms of physical  distress.   Intensity -- THRR unchanged THRR unchanged THRR unchanged THRR unchanged     Progression   Progression -- Continue to progress workloads to maintain intensity without signs/symptoms of physical distress. Continue to progress workloads to maintain intensity without signs/symptoms of physical distress. Continue to progress workloads to maintain intensity without signs/symptoms of physical distress. Continue to progress workloads to maintain intensity without signs/symptoms of physical distress.     Resistance Training   Training Prescription -- Yes Yes Yes Yes   Weight -- 3 3 3 3    Reps -- 10-15 10-15 10-15 10-15     Oxygen    Oxygen  -- Continuous Continuous Continuous Continuous   Liters -- 4 4 3 4      NuStep   Level -- 1 1 -- 2   SPM -- 36 45 -- 43   Minutes --  30 30 -- 30   METs -- 1.3 1.5 -- 1.5     Oxygen    Maintain Oxygen  Saturation -- 88% or higher 88% or higher -- 88% or higher    Row Name 04/12/24 1300 05/07/24 0800           Response to Exercise   Blood Pressure (Admit) 130/68 140/60      Blood Pressure (Exit) 136/62 119/76      Heart Rate (Admit) 115 bpm 113 bpm      Heart Rate (Exercise) 121 bpm 116 bpm      Heart Rate (Exit) 109 bpm 109 bpm      Oxygen  Saturation (Admit) 90 % 93 %      Oxygen  Saturation (Exercise) 90 % 96 %      Oxygen  Saturation (Exit) 92 % 97 %      Rating of Perceived Exertion (Exercise) 14 15      Perceived Dyspnea (Exercise) 2 2      Duration Continue with 30 min of aerobic exercise without signs/symptoms of physical distress. Continue with 30 min of aerobic exercise without signs/symptoms of physical distress.      Intensity THRR unchanged THRR unchanged        Progression   Progression Continue to progress workloads to maintain intensity without signs/symptoms of physical distress. Continue to progress workloads to maintain intensity without signs/symptoms of physical distress.        Resistance Training   Training  Prescription Yes Yes      Weight 3 3      Reps 10-15 10-15        Oxygen    Oxygen  Continuous Continuous      Liters 3 3        NuStep   Level 2 2      SPM 52 50      Minutes 30 30      METs 1.6 1.7        Oxygen    Maintain Oxygen  Saturation 88% or higher 88% or higher         Exercise Comments:   Exercise Goals and Review:   Exercise Goals     Row Name 02/02/24 1636             Exercise Goals   Increase Physical Activity Yes       Intervention Provide advice, education, support and counseling about physical activity/exercise needs.;Develop an individualized exercise prescription for aerobic and resistive training based on initial evaluation findings, risk stratification, comorbidities and participant's personal goals.       Expected Outcomes Short Term: Attend rehab on a regular basis to increase amount of physical activity.;Long Term: Add in home exercise to make exercise part of routine and to increase amount of physical activity.;Long Term: Exercising regularly at least 3-5 days a week.       Increase Strength and Stamina Yes       Intervention Provide advice, education, support and counseling about physical activity/exercise needs.;Develop an individualized exercise prescription for aerobic and resistive training based on initial evaluation findings, risk stratification, comorbidities and participant's personal goals.       Expected Outcomes Short Term: Increase workloads from initial exercise prescription for resistance, speed, and METs.;Short Term: Perform resistance training exercises routinely during rehab and add in resistance training at home;Long Term: Improve cardiorespiratory fitness, muscular endurance and strength as measured by increased METs and functional capacity ( )       Able to understand and use rate of  perceived exertion (RPE) scale Yes       Intervention Provide education and explanation on how to use RPE scale       Expected Outcomes Long Term:   Able to use RPE to guide intensity level when exercising independently;Short Term: Able to use RPE daily in rehab to express subjective intensity level       Able to understand and use Dyspnea scale Yes       Intervention Provide education and explanation on how to use Dyspnea scale       Expected Outcomes Short Term: Able to use Dyspnea scale daily in rehab to express subjective sense of shortness of breath during exertion;Long Term: Able to use Dyspnea scale to guide intensity level when exercising independently       Knowledge and understanding of Target Heart Rate Range (THRR) Yes       Intervention Provide education and explanation of THRR including how the numbers were predicted and where they are located for reference       Expected Outcomes Short Term: Able to state/look up THRR;Short Term: Able to use daily as guideline for intensity in rehab;Long Term: Able to use THRR to govern intensity when exercising independently       Able to check pulse independently Yes       Intervention Provide education and demonstration on how to check pulse in carotid and radial arteries.;Review the importance of being able to check your own pulse for safety during independent exercise       Expected Outcomes Short Term: Able to explain why pulse checking is important during independent exercise;Long Term: Able to check pulse independently and accurately       Understanding of Exercise Prescription Yes       Intervention Provide education, explanation, and written materials on patient's individual exercise prescription       Expected Outcomes Short Term: Able to explain program exercise prescription;Long Term: Able to explain home exercise prescription to exercise independently          Exercise Goals Re-Evaluation :  Exercise Goals Re-Evaluation     Row Name 03/03/24 1151 03/29/24 0935 04/12/24 1142 05/19/24 1007       Exercise Goal Re-Evaluation   Exercise Goals Review Increase Physical  Activity;Increase Strength and Stamina;Understanding of Exercise Prescription Increase Strength and Stamina;Increase Physical Activity;Able to understand and use rate of perceived exertion (RPE) scale;Able to understand and use Dyspnea scale;Understanding of Exercise Prescription Increase Physical Activity;Increase Strength and Stamina;Understanding of Exercise Prescription Increase Physical Activity;Increase Strength and Stamina;Understanding of Exercise Prescription    Comments John [name] is doing well in rehab. He has not increased his level on the nustep but his SPM are increasing slightly. He is exercsing at a RPE of 13. He stated that he does feel a difference in his energy levels after exercise. We talking about energy saviing techniques when doing activities such as grocery shopping. John [name] is doing well in rehab. He states he does not do any exercise at home that this is his only exercise regimen. He is slowly increasing his SPM on the Nustep. He does have to take breaks often due to SOB. John [name] in doing well in rehab. He is deconditioned but has been improving in rehab. He continues to work on his SPM . He gets very winded during exercise and focuses on hos PLB. Will continue to montior and progress as able John [name] has been doing well in rehab. He is still no doing much at home and was  encouraged to move more at home especially on days he feels better.  He is limited by his breathing and fatigue.  We talked about not sitting as much as it does nothing to build his strength and stamina.  He was open to trying to move more.    Expected Outcomes Short: practice energy techniques   long term: continue to attend rehab. Short: Continue to attend rehab to increase strength and stamina. Long: Incorporate exercise at home. Short: Continue to attend rehab to increase strength and stamina. Long: Incorporate exercise at home. Short; Move more at home Long; Continue to improve strength       Discharge Exercise Prescription  (Final Exercise Prescription Changes):  Exercise Prescription Changes - 05/07/24 0800       Response to Exercise   Blood Pressure (Admit) 140/60    Blood Pressure (Exit) 119/76    Heart Rate (Admit) 113 bpm    Heart Rate (Exercise) 116 bpm    Heart Rate (Exit) 109 bpm    Oxygen  Saturation (Admit) 93 %    Oxygen  Saturation (Exercise) 96 %    Oxygen  Saturation (Exit) 97 %    Rating of Perceived Exertion (Exercise) 15    Perceived Dyspnea (Exercise) 2    Duration Continue with 30 min of aerobic exercise without signs/symptoms of physical distress.    Intensity THRR unchanged      Progression   Progression Continue to progress workloads to maintain intensity without signs/symptoms of physical distress.      Resistance Training   Training Prescription Yes    Weight 3    Reps 10-15      Oxygen    Oxygen  Continuous    Liters 3      NuStep   Level 2    SPM 50    Minutes 30    METs 1.7      Oxygen    Maintain Oxygen  Saturation 88% or higher          Nutrition:  Target Goals: Understanding of nutrition guidelines, daily intake of sodium 1500mg , cholesterol 200mg , calories 30% from fat and 7% or less from saturated fats, daily to have 5 or more servings of fruits and vegetables.  Biometrics:  Pre Biometrics - 02/02/24 1637       Pre Biometrics   Height 5' 9 (1.753 m)    Weight 149 lb 9.6 oz (67.9 kg)    Waist Circumference 34 inches    Hip Circumference 37.5 inches    Waist to Hip Ratio 0.91 %    BMI (Calculated) 22.08    Grip Strength 37.3 kg    Single Leg Stand 20.9 seconds           Nutrition Therapy Plan and Nutrition Goals:  Nutrition Therapy & Goals - 01/30/24 1007       Intervention Plan   Intervention Prescribe, educate and counsel regarding individualized specific dietary modifications aiming towards targeted core components such as weight, hypertension, lipid management, diabetes, heart failure and other comorbidities.;Nutrition handout(s) given  to patient.    Expected Outcomes Short Term Goal: Understand basic principles of dietary content, such as calories, fat, sodium, cholesterol and nutrients.;Long Term Goal: Adherence to prescribed nutrition plan.          Nutrition Assessments:  MEDIFICTS Score Key: >=70 Need to make dietary changes  40-70 Heart Healthy Diet <= 40 Therapeutic Level Cholesterol Diet  Flowsheet Row PULMONARY REHAB COPD ORIENTATION from 02/02/2024 in [facility]  Picture Your Plate Total  Score on Admission 50   Picture Your Plate Scores: <59 Unhealthy dietary pattern with much room for improvement. 41-50 Dietary pattern unlikely to meet recommendations for good health and room for improvement. 51-60 More healthful dietary pattern, with some room for improvement.  >60 Healthy dietary pattern, although there may be some specific behaviors that could be improved.    Nutrition Goals Re-Evaluation:  Nutrition Goals Re-Evaluation     Row Name 03/03/24 1321 03/29/24 0932 04/12/24 1147 05/19/24 1011       Goals   Nutrition Goal Healthy eating Healthy eating. Healthy eating. Short: increase water intake. Long: try to choose healthy options for meals.    Comment John [name] is doing well in rehab. He stated that he is eating wha he likes to eat and really only eats about two meals a day. He does not drink a lot of water and we talked about increasing his water intake. He does drink diet sodas throughtout the day. John [name] states he is trying to eat healthy for the most part, he doesn't drink a lot of water, mostly tea. States he feels he is staying hydrated enough during the heat. Encouraged to drink more water. John [name] is doing well in rehab. He states that he is trying to eat healthy and pick healthier options for meals. He continues to not drink much water but does drink sweet tea throughout the day. He has cut back on sodas. He was encourged to drink more water throughtout the day. John [name] is still not  drinking enough water.  We talked about how more water will help with his fluid balance and breathing some. He is also trying to add in some more protein. He eats when he is hungry but still not eating a lot.    Expected Outcome Short: increase water intake long term: try to choose healthy options for meals Short: increase water intake. Long: try to choose healthy options for meals. Short: increase water intake. Long: try to choose healthy options for meals. Short; Continue to try to drink more water Long: Continue to focus on eating around the clock       Nutrition Goals Discharge (Final Nutrition Goals Re-Evaluation):  Nutrition Goals Re-Evaluation - 05/19/24 1011       Goals   Nutrition Goal Short: increase water intake. Long: try to choose healthy options for meals.    Comment John [name] is still not drinking enough water.  We talked about how more water will help with his fluid balance and breathing some. He is also trying to add in some more protein. He eats when he is hungry but still not eating a lot.    Expected Outcome Short; Continue to try to drink more water Long: Continue to focus on eating around the clock          Psychosocial: Target Goals: Acknowledge presence or absence of significant depression and/or stress, maximize coping skills, provide positive support system. Participant is able to verbalize types and ability to use techniques and skills needed for reducing stress and depression.  Initial Review & Psychosocial Screening:  Initial Psych Review & Screening - 01/30/24 0925       Initial Review   Current issues with Current Sleep Concerns;Current Stress Concerns    Source of Stress Concerns Chronic Illness;Unable to participate in former interests or hobbies;Unable to perform yard/household activities    Comments chronic bad sleeper, wife wants him to use his ventilator at home regularly      [facility]  Good Support System? Yes   wife, daughter     Barriers    Psychosocial barriers to participate in program The patient should benefit from training in stress management and relaxation.;Psychosocial barriers identified (see note)      Screening Interventions   Interventions Encouraged to exercise;To provide support and resources with identified psychosocial needs;Provide feedback about the scores to participant    Expected Outcomes Short Term goal: Utilizing psychosocial counselor, staff and physician to assist with identification of specific Stressors or current issues interfering with healing process. Setting desired goal for each stressor or current issue identified.;Long Term Goal: Stressors or current issues are controlled or eliminated.;Short Term goal: Identification and review with participant of any Quality of Life or Depression concerns found by scoring the questionnaire.;Long Term goal: The participant improves quality of Life and PHQ9 Scores as seen by post scores and/or verbalization of changes          Quality of Life Scores:  Scores of 19 and below usually indicate a poorer quality of life in these areas.  A difference of  2-3 points is a clinically meaningful difference.  A difference of 2-3 points in the total score of the Quality of Life Index has been associated with significant improvement in overall quality of life, self-image, physical symptoms, and general health in studies assessing change in quality of life.   PHQ-9: Review Flowsheet  More data exists      01/30/2024 09/10/2023 04/16/2023 10/14/2022 08/08/2022  Depression screen PHQ 2/9  Decreased Interest 0 0 0 0 0  Down, Depressed, Hopeless 0 0 0 0 0  PHQ - 2 Score 0 0 0 0 0  Altered sleeping 0 0 - - -  Tired, decreased energy 2 0 - - -  Change in appetite 1 0 - - -  Feeling bad or failure about yourself  0 0 - - -  Trouble concentrating 0 0 - - -  Moving slowly or fidgety/restless 0 0 - - -  Suicidal thoughts 0 0 - - -  PHQ-9 Score 3 0 - - -  Difficult doing work/chores  Not difficult at all Not difficult at all - - -   Interpretation of Total Score  Total Score Depression Severity:  1-4 = Minimal depression, 5-9 = Mild depression, 10-14 = Moderate depression, 15-19 = Moderately severe depression, 20-27 = Severe depression   Psychosocial Evaluation and Intervention:  Psychosocial Evaluation - 01/30/24 1002       Psychosocial Evaluation & Interventions   Interventions Encouraged to exercise with the program and follow exercise prescription    Comments John [name] is coming into pulmonary rehab for COPD.  He has a long history and has previously been worked up for transplant, but was disqualified after finding an aneursym.  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  He lives with his wife and has his daughter and her son to check on them frequently.  They have no pets or environmental concerns at home. He does use a scooter to get around due to his SOB.  His wife will help him in the shower, but otherwise he is independently with ADLs.  He is 3L oxygen  [facility] 24/7. He will use 4L in shower and walking outside.  He is not able to go and do as he pleases due to his breathing.  He wants to be able to do more and not get so SOB.  They came to look at program prior to signing up and were  pleased.  He did rehab before at [facility] prior to transplant work up. They have no barriers to attending other than $15 copay. We talked about how it can be set up as payment plan with hospital as well.    Expected Outcomes Short: Attend rehab regularly Long: Improve stamina and breathing    Continue Psychosocial Services  Follow up required by staff          Psychosocial Re-Evaluation:  Psychosocial Re-Evaluation     Row Name 03/03/24 1258 03/29/24 0931 04/12/24 1144 05/19/24 1009       Psychosocial Re-Evaluation   Current issues with None Identified None Identified Current Stress Concerns;Current Sleep Concerns Current Stress Concerns;Current Sleep Concerns    Comments John [name] is  doing well in rehab. He stated that he does not have very many issues with his sleep. He does get up during the night to use the restroom but is able to get back to sleep. He also stated that he does not have much stress in his life. He has his everyday worrying about his breathing but other then that he isnt very stressed. John [name] is doing well in rehab. He identifies no stressors in his life. He states he is sleeping ok, he is a very light sleeper so he will wake up easily during the night. John [name] is doing well in rehab. He stated that he get 4 hours of sleep and does okay. He is not napping during the day. He recently had a PET scan done of his intestines and said that they found something but was unsure. He is not really worried or stressed John [name] is doing well in rehab.  He still does not sleep well and usually up at 3am each day. He was encouraged to try to nap in his recliner.  He notes that he gets clogged when sleeping on side.  He is worried about his colonoscopy next week to get better look at mass in intestines.  He is really worried about whether or not he will tolerate procedure and wake up after.  He was encouraged to let us  know how it goes and we talked about it being better to be aware of what is going on for him.    Expected Outcomes Short: continue to have outlets for stress   long : continue to exericse for ovr all stress relef Short: Continue to attend rehab. Long:Continue to maintain a stress free lifestyle. Short: Continue to attend rehab. Long:Continue to maintain a stress free lifestyle. Short; Get through colonoscopy Long: Continue to work on sleep    Interventions Encouraged to attend Pulmonary Rehabilitation for the exercise Encouraged to attend Pulmonary Rehabilitation for the exercise Encouraged to attend Pulmonary Rehabilitation for the exercise Encouraged to attend Pulmonary Rehabilitation for the exercise    Continue Psychosocial Services  Follow up required by staff Follow up  required by staff Follow up required by staff Follow up required by staff       Psychosocial Discharge (Final Psychosocial Re-Evaluation):  Psychosocial Re-Evaluation - 05/19/24 1009       Psychosocial Re-Evaluation   Current issues with Current Stress Concerns;Current Sleep Concerns    Comments John [name] is doing well in rehab.  He still does not sleep well and usually up at 3am each day. He was encouraged to try to nap in his recliner.  He notes that he gets clogged when sleeping on side.  He is worried about his colonoscopy next week to get better look at mass in intestines.  He is really worried about whether or not he will tolerate procedure and wake up after.  He was encouraged to let us  know how it goes and we talked about it being better to be aware of what is going on for him.    Expected Outcomes Short; Get through colonoscopy Long: Continue to work on sleep    Interventions Encouraged to attend Pulmonary Rehabilitation for the exercise    Continue Psychosocial Services  Follow up required by staff           Education: Education Goals: Education classes will be provided on a weekly basis, covering required topics. Participant will state understanding/return demonstration of topics presented.  Learning Barriers/Preferences:  Learning Barriers/Preferences - 01/30/24 0924       Learning Barriers/Preferences   Learning Barriers Sight   glasses   Learning Preferences Skilled Demonstration          Education Topics: How Lungs Work and Diseases: - Discuss the anatomy of the lungs and diseases that can affect the lungs, such as COPD.   Exercise: -Discuss the importance of exercise, FITT principles of exercise, normal and abnormal responses to exercise, and how to exercise safely.   Environmental Irritants: -Discuss types of environmental irritants and how to limit exposure to environmental irritants.   Meds/Inhalers and oxygen : - Discuss respiratory medications,  definition of an inhaler and oxygen , and the proper way to use an inhaler and oxygen .   Energy Saving Techniques: - Discuss methods to conserve energy and decrease shortness of breath when performing activities of daily living.  Flowsheet Row PULMONARY REHAB CHRONIC OBSTRUCTIVE PULMONARY DISEASE from 04/21/2024 in [facility]  Date 03/17/24  Educator HB  Instruction Review Code 1- Verbalizes Understanding    Bronchial Hygiene / Breathing Techniques: - Discuss breathing mechanics, pursed-lip breathing technique,  proper posture, effective ways to clear airways, and other functional breathing techniques   Cleaning Equipment: - Provides group verbal and written instruction about the health risks of elevated stress, cause of high stress, and healthy ways to reduce stress.   Nutrition I: Fats: - Discuss the types of cholesterol, what cholesterol does to the body, and how cholesterol levels can be controlled. Flowsheet Row PULMONARY REHAB CHRONIC OBSTRUCTIVE PULMONARY DISEASE from 04/21/2024 in [place] [facility]  Date 03/31/24  Educator jh  Instruction Review Code 1- Verbalizes Understanding    Nutrition II: Labels: -Discuss the different components of food labels and how to read food labels. Flowsheet Row PULMONARY REHAB CHRONIC OBSTRUCTIVE PULMONARY DISEASE from 04/21/2024 in [place] [facility]  Date 03/31/24  Educator jh  Instruction Review Code 1- Verbalizes Understanding    Respiratory Infections: - Discuss the signs and symptoms of respiratory infections, ways to prevent respiratory infections, and the importance of seeking medical treatment when having a respiratory infection.   Stress I: Signs and Symptoms: - Discuss the causes of stress, how stress may lead to anxiety and depression, and ways to limit stress. Flowsheet Row PULMONARY REHAB CHRONIC OBSTRUCTIVE PULMONARY DISEASE from 04/21/2024 in [place] [facility]  Date 02/04/24  Educator [facility]  Instruction Review Code 1- Verbalizes Understanding    Stress II: Relaxation: -Discuss relaxation techniques to limit stress. Flowsheet Row PULMONARY REHAB CHRONIC OBSTRUCTIVE PULMONARY DISEASE from 04/21/2024 in [place] [facility]  Date 02/04/24  Educator [facility]  Instruction Review Code 1- Verbalizes Understanding    Oxygen  for Home/Travel: - Discuss how to prepare for travel when on oxygen  and proper ways to transport and  store oxygen  to ensure safety.   Knowledge Questionnaire Score:  Knowledge Questionnaire Score - 02/02/24 1638       Knowledge Questionnaire Score   Pre Score 15/18          Core Components/Risk Factors/Patient Goals at Admission:  Personal Goals and Risk Factors at Admission - 02/02/24 1639       Core Components/Risk Factors/Patient Goals on Admission    Weight Management Yes;Weight Maintenance    Intervention Weight Management: Develop a combined nutrition and exercise program designed to reach desired caloric intake, while maintaining appropriate intake of nutrient and fiber, sodium and fats, and appropriate energy expenditure required for the weight goal.;Weight Management: Provide education and appropriate resources to help participant work on and attain dietary goals.    Admit Weight 149 lb 9.6 oz (67.9 kg)    Goal Weight: Short Term 150 lb (68 kg)    Goal Weight: Long Term 150 lb (68 kg)    Expected Outcomes Short Term: Continue to assess and modify interventions until short term weight is achieved;Long Term: Adherence to nutrition and physical activity/exercise program aimed toward attainment of established weight goal;Weight Maintenance: Understanding of the daily nutrition guidelines, which includes 25-35% calories from fat, 7% or less cal from saturated fats, less than 200mg  cholesterol, less than 1.5gm of sodium, & 5 or more servings of fruits and vegetables daily    Improve shortness of  breath with ADL's Yes    Intervention Provide education, individualized exercise plan and daily activity instruction to help decrease symptoms of SOB with activities of daily living.    Expected Outcomes Short Term: Improve cardiorespiratory fitness to achieve a reduction of symptoms when performing ADLs;Long Term: Be able to perform more ADLs without symptoms or delay the onset of symptoms    Increase knowledge of respiratory medications and ability to use respiratory devices properly  Yes    Intervention Provide education and demonstration as needed of appropriate use of medications, inhalers, and oxygen  therapy.    Expected Outcomes Short Term: Achieves understanding of medications use. Understands that oxygen  is a medication prescribed by physician. Demonstrates appropriate use of inhaler and oxygen  therapy.;Long Term: Maintain appropriate use of medications, inhalers, and oxygen  therapy.    Hypertension Yes    Intervention Provide education on lifestyle modifcations including regular physical activity/exercise, weight management, moderate sodium restriction and increased consumption of fresh fruit, vegetables, and low fat dairy, alcohol moderation, and smoking cessation.;Monitor prescription use compliance.    Expected Outcomes Short Term: Continued assessment and intervention until BP is < 140/32mm HG in hypertensive participants. < 130/70mm HG in hypertensive participants with diabetes, heart failure or chronic kidney disease.;Long Term: Maintenance of blood pressure at goal levels.    Lipids Yes    Intervention Provide education and support for participant on nutrition & aerobic/resistive exercise along with prescribed medications to achieve LDL 70mg , HDL >40mg .    Expected Outcomes Short Term: Participant states understanding of desired cholesterol values and is compliant with medications prescribed. Participant is following exercise prescription and nutrition guidelines.;Long Term: Cholesterol  controlled with medications as prescribed, with individualized exercise RX and with personalized nutrition plan. Value goals: LDL < 70mg , HDL > 40 mg.          Core Components/Risk Factors/Patient Goals Review:   Goals and Risk Factor Review     Row Name 03/03/24 1324 03/29/24 0933 04/12/24 1150 05/19/24 1013       Core Components/Risk Factors/Patient Goals Review   Personal Goals Review Weight  Management/Obesity;Stress;Improve shortness of breath with ADL's Weight Management/Obesity;Improve shortness of breath with ADL's;Develop more efficient breathing techniques such as purse lipped breathing and diaphragmatic breathing and practicing self-pacing with activity. Weight Management/Obesity;Improve shortness of breath with ADL's;Increase knowledge of respiratory medications and ability to use respiratory devices properly.;Develop more efficient breathing techniques such as purse lipped breathing and diaphragmatic breathing and practicing self-pacing with activity. Weight Management/Obesity;Improve shortness of breath with ADL's;Increase knowledge of respiratory medications and ability to use respiratory devices properly.;Develop more efficient breathing techniques such as purse lipped breathing and diaphragmatic breathing and practicing self-pacing with activity.    Review John [name] is doing well in rehab. He John [name] is doing well in rehab. He takes all his medications as prescribed. He checks his O2 at home and it often runs low like 85% when he has walked just a short distance. He doesn't check his BP at home, which is really hasn't been a problem for him here at rehab. John [name] is doing well in rehab. He is taking all medications as prescribed and continues to check his O2 stats outside of class. He is focusing on PLB when he gets SOB and when his oxygen  drops. He does not check his BP at home, his BP has not been an issue during rehab but it is encourged to check at home to know paitents resting numbers John [name] is  doing well in rehab.  He is gaining some weight.  He has been working on his breathing.  He still gets SOB easily and it is his biggest limitation.  He is working on PLB and finds it helpful.  He does note when he breathes through his mouth is upsets his stomach as he swallows air more then.  He was encouraged to use his nebulizer more regularly and extra when he needs it versus just when he needs it.    Expected Outcomes -- Short: Continue to check O2 saturation at home. Long: Report abnormalities to all healthcare professionals. Short: Continue to check O2 saturation at home. Long: Report abnormalities to all healthcare professionals. Short: Use nebulizer daily Long: Continue to work on using PLB       Core Components/Risk Factors/Patient Goals at Discharge (Final Review):   Goals and Risk Factor Review - 05/19/24 1013       Core Components/Risk Factors/Patient Goals Review   Personal Goals Review Weight Management/Obesity;Improve shortness of breath with ADL's;Increase knowledge of respiratory medications and ability to use respiratory devices properly.;Develop more efficient breathing techniques such as purse lipped breathing and diaphragmatic breathing and practicing self-pacing with activity.    Review John [name] is doing well in rehab.  He is gaining some weight.  He has been working on his breathing.  He still gets SOB easily and it is his biggest limitation.  He is working on PLB and finds it helpful.  He does note when he breathes through his mouth is upsets his stomach as he swallows air more then.  He was encouraged to use his nebulizer more regularly and extra when he needs it versus just when he needs it.    Expected Outcomes Short: Use nebulizer daily Long: Continue to work on using PLB          ITP Comments:  ITP Comments     Row Name 01/30/24 1011 02/02/24 1630 02/04/24 0913 02/11/24 0828 03/10/24 0754   ITP Comments Completed virtual orientation today in person as he came to see  program prior to signing up.  EP evaluation is scheduled for Monday  May 12 at 1300 .  Documentation for diagnosis can be found in [facility] encounter 01/26/24. Patient attend orientation today.  Patient is attending Pulmonary Rehabilitation Program.  Documentation for diagnosis can be found in [facility] OV 01/26/24.  Reviewed medical chart, RPE/RPD, gym safety, and program guidelines.  Patient was fitted to equipment they will be using during rehab.  Patient is scheduled to start exercise on Wed 5/14 at 915.   Initial ITP created and sent for review and signature by Dr. Anton Kelp, Medical Director for Pulmonary Rehabilitation Program. First full day of exercise!  Patient was oriented to gym and equipment including functions, settings, policies, and procedures.  Patient's individual exercise prescription and treatment plan were reviewed.  All starting workloads were established based on the results of the 6 minute walk test done at initial orientation visit.  The plan for exercise progression was also introduced and progression will be customized based on patient's performance and goals. 30 day review completed. ITP sent to Dr.Jehanzeb Memon, Medical Director of  Pulmonary Rehab. Continue with ITP unless changes are made by physician.  New to program. 30 day review completed. ITP sent to Dr.Jehanzeb Memon, Medical Director of Pulmonary Rehab. Continue with ITP unless changes are made by physician.    Row Name 04/05/24 9061 04/07/24 0830 05/05/24 1002 06/02/24 1426     ITP Comments Called to check on patient when he did not show for rehab today.  John [name] was having a bad breathing day and did not feel up to coming to rehab today.  He hopes to return on Wednesday and was encouraged to use his nebulizer an extra time today. 30 day review completed. ITP sent to Dr.Jehanzeb Memon, Medical Director of  Pulmonary Rehab. Continue with ITP unless changes are made by physician. 30 day review completed. ITP sent to Dr.Jehanzeb Memon,  Medical Director of  Pulmonary Rehab. Continue with ITP unless changes are made by physician. 30 day review completed. ITP sent to Dr.Jehanzeb Memon, Medical Director of  Pulmonary Rehab. Continue with ITP unless changes are made by physician. Has been out this week with appts.       Comments: 30 day review

## 2024-06-03 ENCOUNTER — Ambulatory Visit: Attending: Cardiovascular Disease | Admitting: Cardiovascular Disease

## 2024-06-03 VITALS — BP 140/80 | HR 107 | Ht 69.0 in | Wt 147.4 lb

## 2024-06-03 DIAGNOSIS — Z01810 Encounter for preprocedural cardiovascular examination: Secondary | ICD-10-CM | POA: Diagnosis not present

## 2024-06-03 DIAGNOSIS — I714 Abdominal aortic aneurysm, without rupture, unspecified: Secondary | ICD-10-CM

## 2024-06-03 DIAGNOSIS — I2510 Atherosclerotic heart disease of native coronary artery without angina pectoris: Secondary | ICD-10-CM

## 2024-06-03 DIAGNOSIS — E782 Mixed hyperlipidemia: Secondary | ICD-10-CM

## 2024-06-03 DIAGNOSIS — E7800 Pure hypercholesterolemia, unspecified: Secondary | ICD-10-CM

## 2024-06-03 DIAGNOSIS — I10 Essential (primary) hypertension: Secondary | ICD-10-CM

## 2024-06-03 DIAGNOSIS — I5032 Chronic diastolic (congestive) heart failure: Secondary | ICD-10-CM

## 2024-06-03 MED ORDER — NITROGLYCERIN 0.4 MG SL SUBL: 0.4000 mg | SUBLINGUAL_TABLET | SUBLINGUAL | 6 refills | Status: AC | PRN

## 2024-06-03 MED ORDER — FUROSEMIDE 40 MG PO TABS: 40.0000 mg | ORAL_TABLET | Freq: Every day | ORAL | 3 refills | Status: DC

## 2024-06-03 MED ORDER — ATORVASTATIN CALCIUM 40 MG PO TABS: 40.0000 mg | ORAL_TABLET | Freq: Every day | ORAL | 3 refills | Status: AC

## 2024-06-03 NOTE — Patient Instructions (Signed)
 Medication Instructions:  Your physician recommends that you continue on your current medications as directed. Please refer to the Current Medication list given to you today.  *If you need a refill on your cardiac medications before your next appointment, please call your pharmacy*  Lab Work: none If you have labs (blood work) drawn today and your tests are completely normal, you will receive your results only by: MyChart Message (if you have MyChart) OR A paper copy in the mail If you have any lab test that is abnormal or we need to change your treatment, we will call you to review the results.  Testing/Procedures: none  Follow-Up: At [facility], you and your health needs are our priority.  As part of our continuing mission to provide you with exceptional heart care, our providers are all part of one team.  This team includes your primary Cardiologist (physician) and Advanced Practice Providers or APPs (Physician Assistants and Nurse Practitioners) who all work together to provide you with the care you need, when you need it.  Your next appointment:   6 month(s)  Provider:   Lonni Cash, MD    We recommend signing up for the patient portal called MyChart.  Sign up information is provided on this After Visit Summary.  MyChart is used to connect with patients for Virtual Visits (Telemedicine).  Patients are able to view lab/test results, encounter notes, upcoming appointments, etc.  Non-urgent messages can be sent to your provider as well.   To learn more about what you can do with MyChart, go to [email or web address].   Other Instructions

## 2024-06-07 ENCOUNTER — Encounter (HOSPITAL_COMMUNITY)
Admission: RE | Admit: 2024-06-07 | Discharge: 2024-06-07 | Disposition: A | Discharge status: Home / Self Care | Source: Ambulatory Visit | Attending: Pulmonary Disease | Admitting: Pulmonary Disease

## 2024-06-07 DIAGNOSIS — J4489 Other specified chronic obstructive pulmonary disease: Secondary | ICD-10-CM | POA: Diagnosis not present

## 2024-06-07 DIAGNOSIS — J439 Emphysema, unspecified: Secondary | ICD-10-CM | POA: Diagnosis not present

## 2024-06-07 NOTE — Progress Notes (Signed)
 Daily Session Note  Patient Details  Name: John [name] MRN: [id number] Date of Birth: Jun 30, 1956 Referring Provider:   Flowsheet Row PULMONARY REHAB COPD ORIENTATION from 02/02/2024 in [facility] CARDIAC REHABILITATION  Referring Provider [name] MD    Encounter Date: 06/07/2024  Check In:   Capillary Blood Glucose: No results found for this or any previous visit (from the past 24 hours).    Social History   Tobacco Use  Smoking Status Former   Current packs/day: 0.00   Average packs/day: 2.0 packs/day for 51.6 years (103.1 ttl pk-yrs)   Types: Cigarettes   Start date: 88   Quit date: 04/18/2020   Years since quitting: 4.1  Smokeless Tobacco Never  Tobacco Comments   2 plus packs per day. He has a 100 + pack - year history of tobacco abuse currently. Former 4 ppd for 25 years.    Goals Met:  Independence with exercise equipment Exercise tolerated well No report of concerns or symptoms today Strength training completed today  Goals Unmet:  Not Applicable  Comments: Pt able to follow exercise prescription today without complaint.  Will continue to monitor for progression.

## 2024-06-09 ENCOUNTER — Encounter (HOSPITAL_COMMUNITY)

## 2024-06-10 ENCOUNTER — Ambulatory Visit (INDEPENDENT_AMBULATORY_CARE_PROVIDER_SITE_OTHER): Admitting: Pulmonary Disease

## 2024-06-10 ENCOUNTER — Encounter (HOSPITAL_BASED_OUTPATIENT_CLINIC_OR_DEPARTMENT_OTHER): Payer: Self-pay | Admitting: Pulmonary Disease

## 2024-06-10 ENCOUNTER — Ambulatory Visit (HOSPITAL_BASED_OUTPATIENT_CLINIC_OR_DEPARTMENT_OTHER): Admitting: Pulmonary Disease

## 2024-06-10 VITALS — BP 137/75 | HR 100 | Ht 69.0 in | Wt 147.0 lb

## 2024-06-10 DIAGNOSIS — Z23 Encounter for immunization: Secondary | ICD-10-CM

## 2024-06-10 DIAGNOSIS — J4489 Other specified chronic obstructive pulmonary disease: Secondary | ICD-10-CM | POA: Diagnosis not present

## 2024-06-10 DIAGNOSIS — Z87891 Personal history of nicotine dependence: Secondary | ICD-10-CM

## 2024-06-10 DIAGNOSIS — J9611 Chronic respiratory failure with hypoxia: Secondary | ICD-10-CM | POA: Diagnosis not present

## 2024-06-10 DIAGNOSIS — J9612 Chronic respiratory failure with hypercapnia: Secondary | ICD-10-CM | POA: Diagnosis not present

## 2024-06-10 DIAGNOSIS — C189 Malignant neoplasm of colon, unspecified: Secondary | ICD-10-CM

## 2024-06-10 NOTE — Progress Notes (Signed)
 Subjective:    Patient ID: John [name], male    DOB: 1956/07/17, 68 y.o.   MRN: [id number]   68 y.o. 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    -He is on 3 L oxygen  in the daytime, 4 L on exertion, unable to use NIV during sleep but tries to use at least 4 hours in the daytime.  -100 pack-year smoking history , quit 2021 -assessment at [facility] in July 2022 -NOT a candidate for lung transplant due to coronary artery disease and infrarenal abdominal aortic aneurysm    Past Medical History:  PNA, HTN, HLD, CAD, Seizure, ETOH, infrarenal abdominal aortic aneurysm  Discussed the use of AI scribe software for clinical note transcription with the patient, who gave verbal consent to proceed.  History of Present Illness  Discussed the use of AI scribe software for clinical note transcription with the patient, who gave verbal consent to proceed.  History of Present Illness   John [name] is a 68 year old male with severe end-stage COPD and chronic hypoxic and hypercarbic respiratory failure who presents for evaluation of newly diagnosed colon cancer. He is accompanied by his caregiver.  He has severe end-stage COPD with chronic hypoxic and hypercarbic respiratory failure. Recent pulmonary function tests show a lung capacity of 16% of the expected value for his age and size. He uses a 12-hour inhaler at 9 AM and 9 PM, with additional nebulizer treatments as needed, particularly after exertion. He experiences increased dyspnea following physical activity, such as riding in a vehicle or using a scooter.  He prioritizes maintaining quality of life and is concerned about the impact of treatments on daily living. He is cautious about infection exposure, avoiding contact with sick individuals, and is interested in receiving the flu and COVID vaccines.     Reviewed palliative care note   Significant  tests/ events reviewed   Pulmonary testing:  PFT 12/09/19 >> FEV1 0.59 (16%), FEV1% 37, DLCO 27% A1AT 03/09/20 >> 110, MZ ABG 02/12/21 >> pH 7.28, PCO2 80.9, PO2 109 on 40% FiO2 PFT 04/11/21 >> FEV1 0.6 (18%), FEV1% 26, DLCO 21%   Chest Imaging:  CT angio chest 11/23/19 >> moderate centrilobular emphysema, mild infrahilar BTX, 6 mm nodule LUL CT chest 01/02/21 >> no change LUL nodule, new 1.1 cm LLL opacity CT chest 03/28/21 >> severe emphysema, 2.2 x 0.8 cm band like opacity RLL from ATX, mucus plugging, 3 mm nodule RUL, 1.6 x 0.7 cm nodularity LUL CT chest 04/09/21 >> diffuse centrilobular emphysema, stable nodule LUL, 8 mm nodule RLL CT chest 03/27/22 >> stable GGO LLL CT chest 03/17/23 >> stable GGO LLL   Review of Systems  neg for any significant sore throat, dysphagia, itching, sneezing, nasal congestion or excess/ purulent secretions, fever, chills, sweats, unintended wt loss, pleuritic or exertional cp, hempoptysis, orthopnea pnd or change in chronic leg swelling. Also denies presyncope, palpitations, heartburn, abdominal pain, nausea, vomiting, diarrhea or change in bowel or urinary habits, dysuria,hematuria, rash, arthralgias, visual complaints, headache, numbness weakness or ataxia.      Objective:   Physical Exam  Gen. Pleasant, well-nourished, in no distress, in wheelchair on O2 [facility] - no thrush, no pallor/icterus,no post nasal drip Neck: No JVD, no thyromegaly, no carotid bruits Lungs: no use of accessory muscles, no dullness to percussion, decreased BL  without rales or rhonchi  Cardiovascular: Rhythm regular, heart sounds  normal, no murmurs or  gallops, no peripheral edema Musculoskeletal: No deformities, no cyanosis or clubbing        Assessment & Plan:   Assessment and Plan Assessment & Plan  Assessment and Plan    Severe end stage COPD with chronic hypoxic and hypercapnic respiratory failure Lung function is severely compromised with lung capacity at 16% of  expected. Breathing test results indicate a worsening condition. - Administer flu vaccine today. - Advise to obtain COVID vaccine at a pharmacy. - Instruct to use nebulizer for breakthrough dyspnea when Breztri  is insufficient. - Advise to avoid exposure to infections such as flu, COVID, and RSV.  Colon cancer New diagnosis. Discussed treatment options including surgery, chemotherapy, and no treatment. Surgery poses high risk due to poor lung function and potential complications with anesthesia and post-operative recovery. Chemotherapy may weaken overall health and exacerbate respiratory issues. Discussed potential outcomes and risks, including living 1-2 years without treatment versus risks of surgery and chemotherapy. Surgery has a 50% survival chance with a 30% risk of pneumonia or long-term ventilator dependence. - Consult with surgeon on September 29 for surgical options. - Consult with oncologist on September 30 for chemotherapy options. - Discuss potential outcomes and risks with specialists.

## 2024-06-10 NOTE — Patient Instructions (Addendum)
 X flu shot   VISIT SUMMARY: You came in today for an evaluation of your newly diagnosed colon cancer. We also reviewed your severe end-stage COPD and chronic respiratory failure. You are focused on maintaining your quality of life and are cautious about infection exposure. We discussed treatment options for your colon cancer and the impact of these treatments on your daily living.  YOUR PLAN: -SEVERE END-STAGE COPD WITH CHRONIC HYPOXIC AND HYPERCAPNIC RESPIRATORY FAILURE: Your lung function is severely compromised, with a lung capacity at 16% of what is expected for your age and size. This means your lungs are not working well, making it hard for you to breathe. We administered the flu vaccine today and advised you to get the COVID vaccine at a pharmacy. Use your nebulizer for any breakthrough shortness of breath when your regular inhaler is not enough. Continue to avoid exposure to infections like flu, COVID, and RSV.  -COLON CANCER: You have been newly diagnosed with colon cancer. We discussed treatment options including surgery, chemotherapy, and no treatment. Surgery is risky due to your poor lung function and potential complications with anesthesia and recovery. Chemotherapy may weaken your overall health and worsen your respiratory issues. Without treatment, you may live 1-2 years. Surgery has a better survival chance but comes with a risk of pneumonia or long-term ventilator dependence. You will consult with a surgeon on September 29 and an oncologist on September 30 to discuss these options further.  INSTRUCTIONS: Please follow up with the surgeon on September 29 to discuss surgical options and with the oncologist on September 30 to discuss chemotherapy options. Make sure to get the COVID vaccine at a pharmacy and continue to avoid exposure to infections.                      Contains text generated by Abridge.                                  Contains text generated by Abridge.

## 2024-06-14 ENCOUNTER — Encounter (HOSPITAL_COMMUNITY)
Admission: RE | Admit: 2024-06-14 | Discharge: 2024-06-14 | Disposition: A | Discharge status: Home / Self Care | Source: Ambulatory Visit | Attending: Pulmonary Disease | Admitting: Pulmonary Disease

## 2024-06-14 DIAGNOSIS — J4489 Other specified chronic obstructive pulmonary disease: Secondary | ICD-10-CM | POA: Diagnosis not present

## 2024-06-14 DIAGNOSIS — J439 Emphysema, unspecified: Secondary | ICD-10-CM

## 2024-06-14 NOTE — Progress Notes (Signed)
 Daily Session Note  Patient Details  Name: John [name] MRN: [id number] Date of Birth: Mar 24, 1956 Referring Provider:   Flowsheet Row PULMONARY REHAB COPD ORIENTATION from 02/02/2024 in [facility] CARDIAC REHABILITATION  Referring Provider [name] MD    Encounter Date: 06/14/2024  Check In:  Session Check In - 06/14/24 0900       Check-In   Supervising physician immediately available to respond to emergencies See telemetry face sheet for immediately available MD    Location [facility]    Staff Present Powell Benders, BS, Exercise Physiologist;Azalya Galyon Jackquline, BSN, RN, WTA-C    Virtual Visit No    Medication changes reported     No    Fall or balance concerns reported    No    Tobacco Cessation No Change    Warm-up and Cool-down Performed on first and last piece of equipment    Resistance Training Performed Yes    VAD Patient? No    PAD/SET Patient? No      Pain Assessment   Currently in Pain? No/denies          Capillary Blood Glucose: No results found for this or any previous visit (from the past 24 hours).    Social History   Tobacco Use  Smoking Status Former   Current packs/day: 0.00   Average packs/day: 2.0 packs/day for 51.6 years (103.1 ttl pk-yrs)   Types: Cigarettes   Start date: 60   Quit date: 04/18/2020   Years since quitting: 4.1  Smokeless Tobacco Never  Tobacco Comments   2 plus packs per day. He has a 100 + pack - year history of tobacco abuse currently. Former 4 ppd for 25 years.    Goals Met:  Proper associated with RPD/PD & O2 Sat Independence with exercise equipment Using PLB without cueing & demonstrates good technique Exercise tolerated well No report of concerns or symptoms today Strength training completed today  Goals Unmet:  Not Applicable  Comments: Pt able to follow exercise prescription today without complaint.  Will continue to monitor for progression.

## 2024-06-16 ENCOUNTER — Encounter (HOSPITAL_COMMUNITY)
Admission: RE | Admit: 2024-06-16 | Discharge: 2024-06-16 | Disposition: A | Discharge status: Home / Self Care | Source: Ambulatory Visit | Attending: Pulmonary Disease | Admitting: Pulmonary Disease

## 2024-06-16 DIAGNOSIS — J4489 Other specified chronic obstructive pulmonary disease: Secondary | ICD-10-CM | POA: Diagnosis not present

## 2024-06-16 DIAGNOSIS — J439 Emphysema, unspecified: Secondary | ICD-10-CM

## 2024-06-16 NOTE — Progress Notes (Signed)
 Daily Session Note  Patient Details  Name: John [name] MRN: [id number] Date of Birth: 1956/07/14 Referring Provider:   Flowsheet Row PULMONARY REHAB COPD ORIENTATION from 02/02/2024 in [facility] CARDIAC REHABILITATION  Referring Provider [name] MD    Encounter Date: 06/16/2024  Check In:  Session Check In - 06/16/24 0911       Check-In   Supervising physician immediately available to respond to emergencies See telemetry face sheet for immediately available MD    Location [facility]    Staff Present Harlene Gelineau, MA, RCEP, CCRP, CCET;Varshini Arrants Jackquline, BSN, RN, WTA-C    Virtual Visit No    Medication changes reported     No    Fall or balance concerns reported    No    Tobacco Cessation No Change    Warm-up and Cool-down Performed on first and last piece of equipment    Resistance Training Performed Yes    VAD Patient? No    PAD/SET Patient? No      Pain Assessment   Currently in Pain? No/denies          Capillary Blood Glucose: No results found for this or any previous visit (from the past 24 hours).    Social History   Tobacco Use  Smoking Status Former   Current packs/day: 0.00   Average packs/day: 2.0 packs/day for 51.6 years (103.1 ttl pk-yrs)   Types: Cigarettes   Start date: 40   Quit date: 04/18/2020   Years since quitting: 4.1  Smokeless Tobacco Never  Tobacco Comments   2 plus packs per day. He has a 100 + pack - year history of tobacco abuse currently. Former 4 ppd for 25 years.    Goals Met:  Proper associated with RPD/PD & O2 Sat Independence with exercise equipment Improved SOB with ADL's Using PLB without cueing & demonstrates good technique Exercise tolerated well No report of concerns or symptoms today Strength training completed today  Goals Unmet:  Not Applicable  Comments: Pt able to follow exercise prescription today without complaint.  Will continue to monitor for progression.

## 2024-06-19 DIAGNOSIS — J9610 Chronic respiratory failure, unspecified whether with hypoxia or hypercapnia: Secondary | ICD-10-CM | POA: Diagnosis not present

## 2024-06-21 ENCOUNTER — Encounter (HOSPITAL_COMMUNITY)

## 2024-06-21 DIAGNOSIS — C184 Malignant neoplasm of transverse colon: Secondary | ICD-10-CM | POA: Diagnosis not present

## 2024-06-21 DIAGNOSIS — I713 Abdominal aortic aneurysm, ruptured, unspecified: Secondary | ICD-10-CM | POA: Diagnosis not present

## 2024-06-21 NOTE — Progress Notes (Signed)
 REFERRING PHYSICIAN:  Federico Rosario Stagger, MD  PROVIDER:  BERNARDA WANDA NED, MD  MRN: [id number] DOB: 1956-01-18 DATE OF ENCOUNTER: 06/21/2024  Subjective   Chief Complaint: New Consultation ( Malignant neoplasm Transvers/sigmoid colon)     History of Present Illness: John [name] is a 68 y.o. male who is seen today as an office consultation at the request of Dr. Federico for evaluation of New Consultation ( Malignant neoplasm Transvers/sigmoid colon) .  68 year old male with COPD on 3 L of oxygen  chronically, who presents to the office with a new diagnosis of colon cancer.  Patient was being followed for pulmonary nodules that increased in size on chest CT in June 2025.  Follow-up PET scan demonstrated 2 areas of abnormal uptake within the colon and Cologuard test was positive in 2023.  He did not undergo a colonoscopy at that time.  He did undergo a colonoscopy on May 25, 2024.  This showed a nonobstructing large mass in the transverse colon.  Biopsies were taken.  Area just distal to this was tattooed.  Another large polyp was found in the sigmoid colon and removed piecemeal.  This was also tattooed at the polypectomy site.  2 polyps were removed from the rectum as well.  Pathology showed invasive moderately differentiated adenocarcinoma with high-grade dysplasia in the sigmoid polyp and invasive moderately differentiated adenocarcinoma in the transverse colon.  Patient's past medical history significant for aortic aneurysm rupture and repair in 2012.  Patient has a history of alcohol and tobacco abuse, CHF and MI.   Review of Systems: A complete review of systems was obtained from the patient.  I have reviewed this information and discussed as appropriate with the patient.  See HPI as well for other ROS.    Medical History: Past Medical History:  Diagnosis Date  . Abdominal aortic aneurysm ()    ruptured  . Alcohol abuse   . Aneurysm ()   . CAD (coronary artery  disease)   . CHF (congestive heart failure) (CMS/HHS-HCC)   . COPD (chronic obstructive pulmonary disease) with emphysema (CMS/HHS-HCC)   . History of myocardial infarction   . Hyperlipidemia   . Hypertension   . Oxygen  dependent     Patient Active Problem List  Diagnosis  . Chronic obstructive pulmonary disease (CMS/HHS-HCC)  . Chronic respiratory failure with hypoxia and hypercapnia (CMS/HHS-HCC)  . HLD (hyperlipidemia)  . Rectal bleeding  . Ruptured abdominal aortic aneurysm (AAA) (CMS/HHS-HCC)  . Anal warts    History reviewed. No pertinent surgical history.   Allergies  Allergen Reactions  . Penicillins Other (See Comments)    Passes out    Current Outpatient Medications on File Prior to Visit  Medication Sig Dispense Refill  . albuterol  (PROVENTIL ) 2.5 mg /3 mL (0.083 %) nebulizer solution Inhale 2.5 mg into the lungs every 4 (four) hours as needed    . albuterol  90 mcg/actuation inhaler 2 puffs every 4 hours as needed only  if your can't catch your breath    . aspirin  81 MG EC tablet Take 81 mg by mouth once daily    . atorvastatin  (LIPITOR ) 40 MG tablet Take 40 mg by mouth once daily    . budesonide -glycopyrrolate -formoterol  (BREZTRI  AEROSPHERE) 160-9-4.8 mcg/actuation inhaler Inhale 2 inhalations into the lungs 2 (two) times daily    . FUROsemide  (LASIX ) 40 MG tablet Take 40 mg by mouth 2 (two) times daily    . multivitamin with minerals tablet Take 1 tablet by mouth once daily    .  nitroGLYcerin  (NITROSTAT ) 0.4 MG SL tablet Place 0.4 mg under the tongue every 5 (five) minutes as needed    . OXYGEN -AIR DELIVERY SYSTEMS MISC Inhale 2-3 L into the lungs    . traZODone  (DESYREL ) 50 MG tablet Take 50 mg by mouth at bedtime    . ezetimibe (ZETIA) 10 mg tablet Take 1 tablet (10 mg total) by mouth once daily (Patient taking differently: Take 10 mg by mouth once daily 04/10/2021: Will begin medication after returning home) 30 tablet 3   No current facility-administered  medications on file prior to visit.    Family History  Family history unknown: Yes     Social History   Tobacco Use  Smoking Status Former  . Current packs/day: 0.00  . Average packs/day: 3.0 packs/day for 52.0 years (155.9 ttl pk-yrs)  . Types: Cigarettes  . Start date: 26  . Quit date: 04/18/2020  . Years since quitting: 4.1  Smokeless Tobacco Never     Social History   Socioeconomic History  . Marital status: Single  Tobacco Use  . Smoking status: Former    Current packs/day: 0.00    Average packs/day: 3.0 packs/day for 52.0 years (155.9 ttl pk-yrs)    Types: Cigarettes    Start date: 66    Quit date: 04/18/2020    Years since quitting: 4.1  . Smokeless tobacco: Never  Substance and Sexual Activity  . Alcohol use: Never  . Drug use: Never  . Sexual activity: Defer   Social Drivers of Health   Financial Resource Strain: Low Risk  (09/10/2023)   [facility] from [facility]   Overall Financial Resource Strain (CARDIA)   . Difficulty of Paying Living Expenses: Not hard at all  Food Insecurity: No Food Insecurity (09/10/2023)   Received from [facility]   Hunger Vital Sign   . Within the past 12 months, you worried that your food would run out before you got the money to buy more.: Never true   . Within the past 12 months, the food you bought just didn't last and you didn't have money to get more.: Never true  Transportation Needs: No Transportation Needs (09/10/2023)   Received from [facility] - Transportation   . Lack of Transportation (Medical): No   . Lack of Transportation (Non-Medical): No  Physical Activity: Inactive (09/10/2023)   Received from [facility]   Exercise Vital Sign   . On average, how many days per week do you engage in moderate to strenuous exercise (like a brisk walk)?: 0 days   . On average, how many minutes do you engage in exercise at this level?: 0 min  Stress: No Stress Concern Present (09/10/2023)   Received from [facility] - Occupational Stress Questionnaire   . Feeling of Stress : Not at all  Social Connections: Moderately Integrated (09/10/2023)   Received from [facility]   Social Connection and Isolation Panel   . In a typical week, how many times do you talk on the phone with family, friends, or neighbors?: More than three times a week   . How often do you get together with friends or relatives?: Three times a week   . How often do you attend church or religious services?: Never   . Do you belong to any clubs or organizations such as church groups, unions, fraternal or athletic groups, or school groups?: Yes   . How often do you attend meetings  of the clubs or organizations you belong to?: Never   . Are you married, widowed, divorced, separated, never married, or living with a partner?: Living with partner  Housing Stability: Unknown (06/21/2024)   Housing Stability Vital Sign   . Homeless in the Last Year: No    Objective:    Vitals:   06/21/24 0937  BP: 133/80  Pulse: (!) 117  Temp: 37 C (98.6 F)  TempSrc: Temporal  SpO2: (!) 90%  Weight: 66.9 kg (147 lb 6.4 oz)  Height: 175.3 cm (5' 9)  PainSc: 0-No pain     Exam Gen: NAD Abd: soft, no surgical scar noted    Labs, Imaging and Diagnostic Testing: PET CT reviewed.  Patient with activity in the hepatic flexure and mid sigmoid colon  Assessment and Plan:  Malignant neoplasm of transverse colon (CMS/HHS-HCC)  (primary encounter diagnosis)  68 year old male with COPD and CHF who presents to the office for evaluation of surgical resection of synchronous colon cancers.  Recent PET/CT showed activity in the hepatic flexure and sigmoid colon.  Biopsies show adenocarcinoma in both.  The sigmoid mass was completely removed.  We discussed today surgical resection would technically need to include removing the right colon and sigmoid colon with anastomosis x 2.  I do not think he is healthy  enough to undergo this.  We discussed performing a right colectomy only and monitoring the sigmoid colon.  I am still not sure that he could undergo anesthesia at this point.  I would definitely recommend evaluation at a tertiary Medical Center if he decides he would like to pursue surgical treatment.  He is going to discuss this further with an oncologist at [facility] tomorrow.  They will let us  know if they would like any further referrals or discussion.     Bernarda JAYSON Ned, MD Colon and Rectal Surgery [facility]

## 2024-06-22 ENCOUNTER — Encounter: Payer: Self-pay | Admitting: Oncology

## 2024-06-22 ENCOUNTER — Inpatient Hospital Stay

## 2024-06-22 ENCOUNTER — Inpatient Hospital Stay: Attending: Oncology | Admitting: Oncology

## 2024-06-22 VITALS — BP 121/92 | HR 109 | Temp 97.9°F | Resp 19 | Ht 69.0 in | Wt 147.0 lb

## 2024-06-22 DIAGNOSIS — C184 Malignant neoplasm of transverse colon: Secondary | ICD-10-CM | POA: Diagnosis not present

## 2024-06-22 DIAGNOSIS — Z7189 Other specified counseling: Secondary | ICD-10-CM | POA: Diagnosis not present

## 2024-06-22 DIAGNOSIS — C187 Malignant neoplasm of sigmoid colon: Secondary | ICD-10-CM | POA: Insufficient documentation

## 2024-06-22 DIAGNOSIS — C189 Malignant neoplasm of colon, unspecified: Secondary | ICD-10-CM | POA: Insufficient documentation

## 2024-06-22 NOTE — Patient Instructions (Addendum)
 [facility]  Discharge Instructions  You were seen and examined today by Dr. Davonna. Dr. Davonna is a medical oncologist, meaning that she specializes in the treatment of cancer diagnoses. Dr. Davonna discussed your past medical history, family history of cancers, and the events that led to you being here today.  You were referred to Dr. Davonna for your new diagnosis of colon cancer.  Unfortunately, Dr. Debby does not recommend surgery.  Dr. Davonna has recommended starting chemotherapy. Prior to the start of chemotherapy, you will need a Port-A-Cath placed.  Chemotherapy is known as FOLFOX. It is a combination of chemotherapy drugs given here in the cancer center every 14 days. After 3 to 6 months of chemotherapy, Dr. Davonna discussed re-evaluating if you are a surgical candidate. Chemotherapy is known to decrease your immune system, and also there is a risk of nausea and diarrhea.  If you choose not to do chemotherapy or surgery, the cancer will continue to grow. The greatest risk with leaving the cancer as it currently is, is the risk of bowel obstruction as the cancer continues to grow.   With no treatment, the recommendation is for you to follow-up with palliative care as you have been. When it is time to transition from palliative care to hospice, palliative care will help you with that transition.  Thank you for choosing  [facility] to provide your oncology and hematology care.   To afford each patient quality time with our provider, please arrive at least 15 minutes before your scheduled appointment time. You may need to reschedule your appointment if you arrive late (10 or more minutes). Arriving late affects you and other patients whose appointments are after yours.  Also, if you miss three or more appointments without notifying the office, you may be dismissed from the clinic at the provider's discretion.    Again, thank you  for choosing [facility].  Our hope is that these requests will decrease the amount of time that you wait before being seen by our physicians.   If you have a lab appointment with the Cancer Center - please note that after April 8th, all labs will be drawn in the cancer center.  You do not have to check in or register with the main entrance as you have in the past but will complete your check-in at the cancer center.            _____________________________________________________________  Should you have questions after your visit to [facility], please contact our office at [phone] and follow the prompts.  Our office hours are 8:00 a.m. to 4:30 p.m. Monday - Thursday and 8:00 a.m. to 2:30 p.m. Friday.  Please note that voicemails left after 4:00 p.m. may not be returned until the following business day.  We are closed weekends and all major holidays.  You do have access to a nurse 24-7, just call the main number to the clinic [phone] and do not press any options, hold on the line and a nurse will answer the phone.    For prescription refill requests, have your pharmacy contact our office and allow 72 hours.    Masks are no longer required in the cancer centers. If you would like for your care team to wear a mask while they are taking care of you, please let them know. You may have one support person who is at least 67 years old accompany  you for your appointments.

## 2024-06-22 NOTE — Assessment & Plan Note (Addendum)
 We discussed the diagnosis and poor prognosis of if patient is untreated.  - Patient wishes to be DNR-DNI at this time - Patient does not want to proceed with chemotherapy or surgery at this time and would like to transition to hospice from palliative care if needed.

## 2024-06-22 NOTE — Assessment & Plan Note (Addendum)
 2 sites of colon cancer-hepatic flexure and sigmoid colon, biopsy-proven, p-MMR Patient is a poor candidate for surgery owing to end-stage COPD and is already on palliative care for the same. Patient was evaluated by Dr. Debby with the GI surgery and was determined to be a poor candidate for surgery owing to comorbidities  - We discussed staging at this time is difficult as patient did not undergo surgery.  We discussed other options including neoadjuvant chemotherapy with FOLFOX followed by surgical evaluation at a tertiary center.  We also discussed possible treatment as unresectable cancer with FOLFOX and bevacizumab. -Patient at this time does not wish to have chemotherapy as he is worried about immunosuppression and further decline in functional status - Patient has poor functional status owing to severe COPD and oxygen  dependence -Patient does not have any symptoms at this time including abdominal pain or bleeding.  Would not be a candidate for palliative radiation at this time but can be considered in future if needed - Patient is willing to transition to hospice from the current palliative treatment if needed in future.  Recommended patient to reach out to us  in future if he considers above-mentioned treatment options.  No further workup needed at this time.

## 2024-06-22 NOTE — Progress Notes (Signed)
 Hematology-Oncology Clinic Note  John [name] POUR, MD   Reason for Referral: Adenocarcinoma of the sigmoid and transverse colon, MMR preserved  Oncology History: I have reviewed his chart and materials related to his cancer extensively and collaborated history with the patient. Summary of oncologic history is as follows:  Diagnosis: Adenocarcinoma of the sigmoid and transverse colon, MMR preserved  -04/22/2022: Positive Cologard -03/05/2024: CT chest: Enlarging irregular pulmonary nodule within the right lower lobe measuring 13 x 19 x 24 mm. Findings are concerning for a progressive neoplasm and PET CT examination is recommended for further evaluation. -03/25/2024: Initial PET: Nodular areas seen in the lungs on the previous CT scan are again identified. The right basilar focus appears smaller and the left is similar. Neither of these areas show abnormal uptake.There are 2 areas of abnormal uptake along the colon with masslike wall thickening. One along the proximal transverse colon and 1 along the distal sigmoid colon. There is a third area of asymmetric uptake along the very proximal sigmoid colon where as wall thickening and spiculation which was seen on the prior examination 2023. This has less intense uptake than the other areas of colonic uptake and could be sequela of diverticulitis. -05/25/2024: Colonoscopy: A frond- like/ villous, fungating and infiltrative non- obstructing large mass found in the transverse colon, biopsied. A 40 mm polyp found in the sigmoid colon, resected. Two sessile 8 to 18 mm polyps found in the rectum, resected.  Pathology: Sigmoid polyp and transverse colonic mass positive for invasive moderately differentiated adenocarcinoma.  Carcinoma in sigmoid polyp is arising in a tubular adenoma with high-grade dysplasia. Margins uncertain due to fragmented nature of the specimen, but carcinoma appears to be present at cauterized tissue edges. Carcinoma invades at least  superficial submucosa. MMR preserved.    History of Presenting Illness: John [name] 68 y.o. male is referred by John Rosario BROCKS, MD for adenocarcinoma of the sigmoid and transverse colon. He is accompanied by his girlfriend.   Patient has a history of end-stage COPD with chronic hypoxic and hypercapnic respiratory failure.  Patient had a CT chest done on 03/05/2024 to follow up on a lung nodule, which showed it had increased in size to 13 x 19 x 24 mm and was concerning for a progressive neoplasm. He then had an initial PET on 03/25/2024 which found 2 areas of abnormal uptake along the colon with masslike wall thickening on the proximal transverse colon and the distal sigmoid colon. John [name] was referred to gastroenterology and underwent a colonoscopy on 05/25/2024 that found a frond- like/ villous, fungating and infiltrative non- obstructing large mass was found in the transverse colon which was biopsied. The colonoscopy also found a 40 mm polyp was found in the sigmoid colon which was resected. Two sessile 8 to 18 mm polyps were found in the rectum which were resected. Pathology of the sigmoid polyp and transverse colonic mass showed invasive moderately differentiated adenocarcinoma.   He was seen by Dr. Debby [name] surgery] yesterday and felt not to be a surgical candidate due to severe pulmonary emphysema as well as cardiac issues. The patient has also spoken with his cardiologist and pulmonologist about surgery who reportedly did not recommend surgery either.   At today's visit, we discussed PET results and possible treatment options. The patient and his loved one expressed concern about John [name]'s quality of life after surgery. John [name] does not wish to have an ostomy bag and is not willing to undergo surgery due to this possible complication.  He does not wish to proceed with chemotherapy either as he feels his weakness and quality of life will worsen.   John [name] is already participating in  palliative care and has discussed with palliative about transitioning to hospice in the future. John [name]'s son is his power of attorney when he is unable to make medical decisions.   Medical History: Past Medical History:  Diagnosis Date   Abdominal aortic aneurysm (AAA)    EVAR Dr. Lanis  2023   Alcohol abuse 12/27/2010   Allergic rhinitis, cause unspecified 12/27/2010   Anal warts 04/11/2012   CAD, NATIVE VESSEL 10/04/2010   COPD (chronic obstructive pulmonary disease) (HCC)    Stage 4 uses o2 3 L continous   History of blood transfusion 03/2012   related to nose bleed   HYPERLIPIDEMIA-MIXED 10/04/2010   HYPERTENSION, BENIGN 10/04/2010   Myocardial infarction (HCC) 09/16/2010   x2   On home O2    Pneumonia    when I was a kid   Stented coronary artery    Mid LAD    TOBACCO ABUSE 10/04/2010    Surgical history: Past Surgical History:  Procedure Laterality Date   ABDOMINAL AORTIC ENDOVASCULAR STENT GRAFT N/A 07/28/2022   Procedure: ABDOMINAL AORTIC ENDOVASCULAR STENT GRAFT;  Surgeon: John Fonda BRAVO, MD;  Location: [facility] OR;  Service: Vascular;  Laterality: N/A;   CARDIAC CATHETERIZATION  ~ 2012   COLONOSCOPY N/A 05/25/2024   Procedure: COLONOSCOPY;  Surgeon: John Rosario BROCKS, MD;  Location: [facility]COPY;  Service: Gastroenterology;  Laterality: N/A;   CORONARY ANGIOPLASTY WITH STENT PLACEMENT  09/17/2010; 09/28/2014   1; 2   FINGER SURGERY Left    almost cut off tip of 2nd digit   LEFT HEART CATHETERIZATION WITH CORONARY ANGIOGRAM N/A 09/28/2014   Procedure: LEFT HEART CATHETERIZATION WITH CORONARY ANGIOGRAM;  Surgeon: John JONETTA Cash, MD;  Location: [facility] CATH LAB;  Service: Cardiovascular;  Laterality: N/A;   NASAL ENDOSCOPY WITH EPISTAXIS CONTROL Bilateral 03/2012   ULTRASOUND GUIDANCE FOR VASCULAR ACCESS N/A 07/28/2022   Procedure: ULTRASOUND GUIDANCE FOR VASCULAR ACCESS;  Surgeon: John Fonda BRAVO, MD;  Location: [facility] OR;  Service: Vascular;  Laterality: N/A;   VASECTOMY        Allergies:  is allergic to penicillins.  Medications:  Current Outpatient Medications  Medication Sig Dispense Refill   albuterol  (PROAIR  HFA) 108 (90 Base) MCG/ACT inhaler Inhale 2 puffs into the lungs every 4 (four) hours as needed for wheezing or shortness of breath. 2 puffs every 4 hours as needed only  if your can't catch your breath 8 g 5   albuterol  (PROVENTIL ) (2.5 MG/3ML) 0.083% nebulizer solution Take 3 mLs (2.5 mg total) by nebulization every 4 (four) hours as needed for wheezing or shortness of breath. 360 mL 5   aspirin  EC 81 MG tablet Take 81 mg by mouth daily.     atorvastatin  (LIPITOR ) 40 MG tablet Take 1 tablet (40 mg total) by mouth daily. 90 tablet 3   budesonide -glycopyrrolate -formoterol  (BREZTRI  AEROSPHERE) 160-9-4.8 MCG/ACT AERO inhaler Inhale 2 puffs into the lungs in the morning and at bedtime. 10.7 g 11   furosemide  (LASIX ) 40 MG tablet Take 1 tablet (40 mg total) by mouth daily. 90 tablet 3   guaiFENesin  (MUCINEX ) 600 MG 12 hr tablet Take 2 tablets (1,200 mg total) by mouth 2 (two) times daily as needed for cough or to loosen phlegm.     Magnesium  250 MG CAPS Take by mouth daily.     Multiple Vitamin (MULTIVITAMIN  WITH MINERALS) TABS tablet Take 1 tablet by mouth daily.     nitroGLYCERIN  (NITROSTAT ) 0.4 MG SL tablet Place 1 tablet (0.4 mg total) under the tongue every 5 (five) minutes as needed for chest pain. 25 tablet 6   OXYGEN  Inhale 2-3 L into the lungs.     No current facility-administered medications for this visit.    Review of Systems: Constitutional: Denies fevers, chills or abnormal night sweats Eyes: Denies blurriness of vision, double vision or watery eyes Ears, nose, mouth, throat, and face: Denies mucositis or sore throat Respiratory: Denies cough, dyspnea or wheezes Cardiovascular: Denies palpitation, chest discomfort or lower extremity swelling Gastrointestinal:  Denies nausea, heartburn or change in bowel habits Skin: Denies abnormal  skin rashes Lymphatics: Denies new lymphadenopathy or easy bruising Neurological:Denies numbness, tingling or new weaknesses Behavioral/Psych: Mood is stable, no new changes  All other systems were reviewed with the patient and are negative.  Physical Examination: ECOG PERFORMANCE STATUS: 2 - Symptomatic, <50% confined to bed  Vitals:   06/22/24 1053  BP: (!) 121/92  Pulse: (!) 109  Resp: 19  Temp: 97.9 F (36.6 C)  SpO2: 97%   Filed Weights   06/22/24 1053  Weight: 147 lb (66.7 kg)    GENERAL: Alert male in a wheelchair, oxygen  via nasal cannula  LYMPH:  no palpable lymphadenopathy in the cervical, axillary or inguinal LUNGS: clear to auscultation and percussion with normal breathing effort HEART: regular rate & rhythm and no murmurs and no lower extremity edema ABDOMEN:abdomen soft, non-tender and normal bowel sounds Musculoskeletal:no cyanosis of digits and no clubbing  PSYCH: alert & oriented x 3 with fluent speech NEURO: no focal motor/sensory deficits   Laboratory Data: I have reviewed the data as listed Lab Results  Component Value Date   WBC 8.3 10/21/2023   HGB 12.9 (L) 10/21/2023   HCT 39.5 10/21/2023   MCV 99 (H) 10/21/2023   PLT 198 10/21/2023   Recent Labs    10/21/23 1341  NA 145*  K 4.5  CL 96  CO2 38*  GLUCOSE 101*  BUN 17  CREATININE 1.12  CALCIUM  9.5  PROT 6.5  ALBUMIN 4.1  AST 30  ALT 24  ALKPHOS 154*  BILITOT 0.3    Radiographic Studies: I have personally reviewed the radiological images as listed and agreed with the findings in the report.  NM PET Image Initial (PI) Skull Base To Thigh CLINICAL DATA:  Initial treatment strategy for right lower lobe pulmonary nodule.  EXAM: NUCLEAR MEDICINE PET SKULL BASE TO THIGH  TECHNIQUE: 7.38 mCi F-18 FDG was injected intravenously. Full-ring PET imaging was performed from the skull base to thigh after the radiotracer. CT data was obtained and used for attenuation correction and  anatomic localization.  Fasting blood glucose: 112 mg/dl  COMPARISON:  CT 93/86/7974 and older  FINDINGS: Mediastinal blood pool activity: SUV max 2.4  Liver activity: SUV max 3.0  NECK: There is physiologic paraspinal muscle uptake in the upper cervical region as well as along the strap muscles in the low neck. No specific abnormal uptake identified along lymph node change the neck including submandibular, posterior triangle internal jugular regions. Near symmetric uptake of the intracranial compartment.  Incidental CT findings: Paranasal sinuses and mastoid air cells are clear. Mild scattered vascular calcifications. The parotid glands, submandibular glands and thyroid  gland are unremarkable.  CHEST: No specific abnormal uptake above blood pool in the axillary regions, hilum or mediastinum. Mild esophageal uptake, nonspecific. On the prior CT  scan there are some areas of nodularity. This includes the slightly spiculated area in the inferior right lower lobe. On the most recent study this measured 19 x 13 mm. Going back to June 2024 12 x 7 mm. Today 15 by 5 mm on image 79 of series 202. Maximum SUV of 0.3. The more ill-defined ground-glass area in the posteromedial aspect left lower lobe is again seen today on image 71 and appears similar. This area also does not show significant uptake with maximum SUV of 0.9. No specific areas of abnormal uptake in the lung parenchyma.  Incidental CT findings: Once again emphysematous lung changes are identified. Breathing motion. No consolidation, pneumothorax or effusion. Few areas of scarring and fibrotic changes identified along the lungs as well as some atelectasis. No consolidation, pneumothorax or effusion. Lower lung bulla formation identified bilaterally as well. Coronary artery calcifications are seen. Please correlate for other coronary risk factors. The heart is nonenlarged. No pericardial effusion. The thoracic aorta is normal  course and caliber with some calcified atherosclerotic plaque.  ABDOMEN/PELVIS: Physiologic distribution radiotracer along the parenchymal organs and renal collecting systems.  There are some scattered areas of uptake along colon. Asymmetric area with some potential wall thickening along the proximal transverse colon with maximum SUV value of 10.9. This is seen on CT image 104. An aggressive or neoplastic process is possible. Similar area identified along the distal sigmoid colon with an area of uptake of maximum SUV of 33 corresponding to an area of nodularity along the bowel on image 131.  There is also a spiculated area with some uptake involving the proximal sigmoid colon, distal descending colon corresponding to image 130. The proximal transverse colon area was not seen on the CT scan of November 2023. The distal area was identified in retrospect could be sequela of diverticulitis. Please correlate for any known history. Otherwise recommend further evaluation.  No specific nodal areas of abnormal uptake identified.  Incidental CT findings: On the limited noncontrast CT, grossly the liver, spleen, adrenal glands and pancreas are unremarkable. Gallbladder is present. Right-sided benign-appearing renal cyst again identified as on prior. No renal or ureteral stones identified. Preserved contour to the urinary bladder. Stomach and small bowel are nondilated. Aortic endograft in place. Incompletely evaluated this examination. The previous very large aneurysm is markedly improved compared to the study of 2023. There is a prior endoleak reported. Transverse dimension of the aorta at that time was up to 9.7 cm. Diameter of the abdominal aorta today is transverse of 4.1 cm in AP 5.8 cm. Dedicated endograft protocol could be performed as clinically appropriate.  SKELETON: No abnormal uptake identified along the visualized osseous structures.  Incidental CT findings: Scattered  degenerative changes.  IMPRESSION: Nodular areas seen in the lungs on the previous CT scan are again identified. The right basilar focus appears smaller and the left is similar. Neither of these areas show abnormal uptake. Recommend simple CT surveillance.  However there are 2 areas of abnormal uptake along the colon with masslike wall thickening. One along the proximal transverse colon and 1 along the distal sigmoid colon. Aggressive or neoplastic lesions are in the differential and recommend dedicated colonic evaluation when appropriate if there is no known history.  There is a third area of asymmetric uptake along the very proximal sigmoid colon where as wall thickening and spiculation which was seen on the prior examination 2023. This has less intense uptake than the other areas of colonic uptake and could be sequela of diverticulitis.  Please correlate with symptoms and history.  Aortic endograft in place. The size of the aneurysm is markedly decreased from the study of 2023. Dedicated endograft protocol could be performed as and when clinically appropriate.  Advanced emphysematous lung changes.  Electronically Signed   By: Ranell Bring M.D.   On: 03/26/2024 13:42    ASSESSMENT & PLAN:  Patient is a 68 y.o. male presenting for colon adenocarcinoma  Assessment & Plan Colon carcinoma (HCC) 2 sites of colon cancer-hepatic flexure and sigmoid colon, biopsy-proven, p-MMR Patient is a poor candidate for surgery owing to end-stage COPD and is already on palliative care for the same. Patient was evaluated by Dr. Debby with the GI surgery and was determined to be a poor candidate for surgery owing to comorbidities  - We discussed staging at this time is difficult as patient did not undergo surgery.  We discussed other options including neoadjuvant chemotherapy with FOLFOX followed by surgical evaluation at a tertiary center.  We also discussed possible treatment as unresectable  cancer with FOLFOX and bevacizumab. -Patient at this time does not wish to have chemotherapy as he is worried about immunosuppression and further decline in functional status - Patient has poor functional status owing to severe COPD and oxygen  dependence -Patient does not have any symptoms at this time including abdominal pain or bleeding.  Would not be a candidate for palliative radiation at this time but can be considered in future if needed - Patient is willing to transition to hospice from the current palliative treatment if needed in future.  Recommended patient to reach out to us  in future if he considers above-mentioned treatment options.  No further workup needed at this time. Goals of care, counseling/discussion We discussed the diagnosis and poor prognosis of if patient is untreated.  - Patient wishes to be DNR-DNI at this time - Patient does not want to proceed with chemotherapy or surgery at this time and would like to transition to hospice from palliative care if needed.    No orders of the defined types were placed in this encounter.   The total time spent in the appointment was 60 minutes encounter with patients including review of chart and various tests results, discussions about plan of care and coordination of care plan   All questions were answered. The patient knows to call the clinic with any problems, questions or concerns. No barriers to learning was detected.  Mickiel Dry, MD 9/30/20254:54 PM

## 2024-06-23 ENCOUNTER — Other Ambulatory Visit (HOSPITAL_BASED_OUTPATIENT_CLINIC_OR_DEPARTMENT_OTHER): Payer: Self-pay | Admitting: Pulmonary Disease

## 2024-06-23 ENCOUNTER — Encounter (HOSPITAL_COMMUNITY)

## 2024-06-23 MED ORDER — ALBUTEROL SULFATE HFA 108 (90 BASE) MCG/ACT IN AERS: 2.0000 | INHALATION_SPRAY | RESPIRATORY_TRACT | 5 refills | Status: AC | PRN

## 2024-06-23 NOTE — Telephone Encounter (Signed)
 Copied from CRM #[id number]. Topic: Clinical - Medication Refill >> Jun 23, 2024  8:40 AM Isabell A wrote: Medication: albuterol  (PROAIR  HFA) 108 (90 Base) MCG/ACT inhaler   Has the patient contacted their pharmacy? Yes (Agent: If no, request that the patient contact the pharmacy for the refill. If patient does not wish to contact the pharmacy document the reason why and proceed with request.) (Agent: If yes, when and what did the pharmacy advise?)  This is the patient's preferred pharmacy:  [id number] - [place], [place] - [address] AT [facility] [address] [place] [address] Phone: [phone] Fax: [phone]   Is this the correct pharmacy for this prescription? Yes If no, delete pharmacy and type the correct one.   Has the prescription been filled recently? Yes  Is the patient out of the medication? Yes  Has the patient been seen for an appointment in the last year OR does the patient have an upcoming appointment? Yes  Can we respond through MyChart? No  Agent: Please be advised that Rx refills may take up to 3 business days. We ask that you follow-up with your pharmacy.

## 2024-06-28 ENCOUNTER — Encounter (HOSPITAL_COMMUNITY)
Admission: RE | Admit: 2024-06-28 | Discharge: 2024-06-28 | Disposition: A | Discharge status: Home / Self Care | Source: Ambulatory Visit | Attending: Pulmonary Disease | Admitting: Pulmonary Disease

## 2024-06-28 DIAGNOSIS — J4489 Other specified chronic obstructive pulmonary disease: Secondary | ICD-10-CM | POA: Insufficient documentation

## 2024-06-28 NOTE — Progress Notes (Signed)
 Daily Session Note  Patient Details  Name: John [name] MRN: [id number] Date of Birth: 02/26/1956 Referring Provider:   Flowsheet Row PULMONARY REHAB COPD ORIENTATION from 02/02/2024 in [facility] CARDIAC REHABILITATION  Referring Provider [name] MD    Encounter Date: 06/28/2024  Check In:  Session Check In - 06/28/24 0925       Check-In   Supervising physician immediately available to respond to emergencies See telemetry face sheet for immediately available MD    Location [facility]    Staff Present Laymon Rattler, BSN, RN, Rosalba Gelineau, MA, RCEP, CCRP, CCET;Victoria Newark, RN    Virtual Visit No    Medication changes reported     No    Fall or balance concerns reported    No    Tobacco Cessation No Change    Warm-up and Cool-down Performed on first and last piece of equipment    Resistance Training Performed Yes    VAD Patient? No    PAD/SET Patient? No      Pain Assessment   Currently in Pain? No/denies          Capillary Blood Glucose: No results found for this or any previous visit (from the past 24 hours).    Social History   Tobacco Use  Smoking Status Former   Current packs/day: 0.00   Average packs/day: 2.0 packs/day for 51.6 years (103.1 ttl pk-yrs)   Types: Cigarettes   Start date: 10   Quit date: 04/18/2020   Years since quitting: 4.1  Smokeless Tobacco Never  Tobacco Comments   2 plus packs per day. He has a 100 + pack - year history of tobacco abuse currently. Former 4 ppd for 25 years.    Goals Met:  Proper associated with RPD/PD & O2 Sat Independence with exercise equipment Improved SOB with ADL's Using PLB without cueing & demonstrates good technique Exercise tolerated well No report of concerns or symptoms today Strength training completed today  Goals Unmet:  Not Applicable  Comments: Pt able to follow exercise prescription today without complaint.  Will continue to monitor for  progression.

## 2024-06-30 ENCOUNTER — Encounter (HOSPITAL_COMMUNITY)
Admission: RE | Admit: 2024-06-30 | Discharge: 2024-06-30 | Disposition: A | Source: Ambulatory Visit | Attending: Pulmonary Disease

## 2024-06-30 ENCOUNTER — Encounter (HOSPITAL_COMMUNITY): Payer: Self-pay | Admitting: *Deleted

## 2024-06-30 DIAGNOSIS — J4489 Other specified chronic obstructive pulmonary disease: Secondary | ICD-10-CM

## 2024-06-30 NOTE — Progress Notes (Signed)
 Daily Session Note  Patient Details  Name: John [name] MRN: [id number] Date of Birth: 01/25/1956 Referring Provider:   Flowsheet Row PULMONARY REHAB COPD ORIENTATION from 02/02/2024 in [facility] CARDIAC REHABILITATION  Referring Provider [name] MD    Encounter Date: 06/30/2024  Check In:  Session Check In - 06/30/24 0904       Check-In   Supervising physician immediately available to respond to emergencies See telemetry face sheet for immediately available MD    Location [facility]    Staff Present Laymon Rattler, BSN, RN, WTA-C;Heather Con, BS, Exercise Physiologist;Jessica Vonzell, MA, RCEP, CCRP, CCET    Virtual Visit No    Medication changes reported     No    Fall or balance concerns reported    No    Tobacco Cessation No Change    Warm-up and Cool-down Performed on first and last piece of equipment    Resistance Training Performed Yes    VAD Patient? No    PAD/SET Patient? No      Pain Assessment   Currently in Pain? No/denies          Capillary Blood Glucose: No results found for this or any previous visit (from the past 24 hours).    Social History   Tobacco Use  Smoking Status Former   Current packs/day: 0.00   Average packs/day: 2.0 packs/day for 51.6 years (103.1 ttl pk-yrs)   Types: Cigarettes   Start date: 51   Quit date: 04/18/2020   Years since quitting: 4.2  Smokeless Tobacco Never  Tobacco Comments   2 plus packs per day. He has a 100 + pack - year history of tobacco abuse currently. Former 4 ppd for 25 years.    Goals Met:  Proper associated with RPD/PD & O2 Sat Independence with exercise equipment Improved SOB with ADL's Using PLB without cueing & demonstrates good technique Exercise tolerated well No report of concerns or symptoms today Strength training completed today  Goals Unmet:  Not Applicable  Comments: Pt able to follow exercise prescription today without complaint.  Will continue to  monitor for progression.

## 2024-06-30 NOTE — Progress Notes (Signed)
 Pulmonary Individual Treatment Plan  Patient Details  Name: John [name] MRN: [id number] Date of Birth: [birth year] Referring Provider:   Flowsheet Row PULMONARY REHAB COPD ORIENTATION from 02/02/2024 in [facility]ARDI[facility] REH[facility]  Referring Provider [name] MD    Initial Encounter Date:  Flowsheet Row PULMONARY REHAB COPD ORIENTATION from 02/02/2024 in [facility]  Date 02/02/24    Visit Diagnosis: Chronic bronchitis with COPD (chronic obstructive pulmonary disease) (HCC)  Patient's Home Medications on Admission:   Current Outpatient Medications:    albuterol  (PROAIR  HFA) 108 (90 Base) MCG/ACT inhaler, Inhale 2 puffs into the lungs every 4 (four) hours as needed for wheezing or shortness of breath. 2 puffs every 4 hours as needed only  if your can't catch your breath, Disp: 8 g, Rfl: 5   albuterol  (PROVENTIL ) (2.5 MG/3ML) 0.083% nebulizer solution, Take 3 mLs (2.5 mg total) by nebulization every 4 (four) hours as needed for wheezing or shortness of breath., Disp: 360 mL, Rfl: 5   aspirin  EC 81 MG tablet, Take 81 mg by mouth daily., Disp: , Rfl:    atorvastatin  (LIPITOR ) 40 MG tablet, Take 1 tablet (40 mg total) by mouth daily., Disp: 90 tablet, Rfl: 3   budesonide -glycopyrrolate -formoterol  (BREZTRI  AEROSPHERE) 160-9-4.8 MCG/ACT AERO inhaler, Inhale 2 puffs into the lungs in the morning and at bedtime., Disp: 10.7 g, Rfl: 11   furosemide  (LASIX ) 40 MG tablet, Take 1 tablet (40 mg total) by mouth daily., Disp: 90 tablet, Rfl: 3   guaiFENesin  (MUCINEX ) 600 MG 12 hr tablet, Take 2 tablets (1,200 mg total) by mouth 2 (two) times daily as needed for cough or to loosen phlegm., Disp: , Rfl:    Magnesium  250 MG CAPS, Take by mouth daily., Disp: , Rfl:    Multiple Vitamin (MULTIVITAMIN WITH MINERALS) TABS tablet, Take 1 tablet by mouth daily., Disp: , Rfl:    nitroGLYCERIN  (NITROSTAT ) 0.4 MG SL tablet, Place 1 tablet (0.4 mg total) under the tongue every 5  (five) minutes as needed for chest pain., Disp: 25 tablet, Rfl: 6   OXYGEN , Inhale 2-3 L into the lungs., Disp: , Rfl:   Past Medical History: Past Medical History:  Diagnosis Date   Abdominal aortic aneurysm (AAA)    EVAR Dr. Lanis  2023   Alcohol abuse 12/27/2010   Allergic rhinitis, cause unspecified 12/27/2010   Anal warts 04/11/2012   CAD, NATIVE VESSEL 10/04/2010   COPD (chronic obstructive pulmonary disease) (HCC)    Stage 4 uses o2 3 L continous   History of blood transfusion 03/2012   related to nose bleed   HYPERLIPIDEMIA-MIXED 10/04/2010   HYPERTENSION, BENIGN 10/04/2010   Myocardial infarction (HCC) 09/16/2010   x2   On home O2    Pneumonia    when I was a kid   Stented coronary artery    Mid LAD    TOBACCO ABUSE 10/04/2010    Tobacco Use: Social History   Tobacco Use  Smoking Status Former   Current packs/day: 0.00   Average packs/day: 2.0 packs/day for 51.6 years (103.1 ttl pk-yrs)   Types: Cigarettes   Start date: 71   Quit date: 04/18/2020   Years since quitting: 4.2  Smokeless Tobacco Never  Tobacco Comments   2 plus packs per day. He has a 100 + pack - year history of tobacco abuse currently. Former 4 ppd for 25 years.    Labs: Review Flowsheet  More data exists      Latest Ref Rng &  Units 02/12/2021 03/22/2022 07/28/2022 10/14/2022 10/21/2023  Labs for ITP Cardiac and Pulmonary Rehab  Cholestrol 100 - 199 mg/dL - 833  - 840  821   LDL (calc) 0 - 99 mg/dL - 76  - 72  81   HDL-C >39 mg/dL - 77  - 76  86   Trlycerides 0 - 149 mg/dL - 70  - 55  57   PH, Arterial 7.35 - 7.45 7.283  - 7.265  - -  PCO2 arterial 32 - 48 mmHg 80.9  - 80.0  - -  Bicarbonate 20.0 - 28.0 mmol/L 31.4  - 36.7  - -  TCO2 22 - 32 mmol/L - - 39  - -  O2 Saturation % 97.2  - 97  - -    Capillary Blood Glucose: Lab Results  Component Value Date   GLUCAP 185 (H) 04/25/2020   GLUCAP 80 04/25/2020   GLUCAP 80 04/25/2020   GLUCAP 131 (H) 04/24/2020   GLUCAP 176 (H)  04/24/2020     Pulmonary Assessment Scores:  Pulmonary Assessment Scores     Row Name 02/02/24 1640         ADL UCSD   ADL Phase Entry     SOB Score total 60     Rest 0     Walk 3     Stairs 4     Bath 4     Dress 2     Shop 3       CAT Score   CAT Score 17       mMRC Score   mMRC Score 4       UCSD: Self-administered rating of dyspnea associated with activities of daily living (ADLs) 6-point scale (0 = not at all to 5 = maximal or unable to do because of breathlessness)  Scoring Scores range from 0 to 120.  Minimally important difference is 5 units  CAT: CAT can identify the health impairment of COPD patients and is better correlated with disease progression.  CAT has a scoring range of zero to 40. The CAT score is classified into four groups of low (less than 10), medium (10 - 20), high (21-30) and very high (31-40) based on the impact level of disease on health status. A CAT score over 10 suggests significant symptoms.  A worsening CAT score could be explained by an exacerbation, poor medication adherence, poor inhaler technique, or progression of COPD or comorbid conditions.  CAT MCID is 2 points  mMRC: mMRC (Modified Medical Research Council) Dyspnea Scale is used to assess the degree of baseline functional disability in patients of respiratory disease due to dyspnea. No minimal important difference is established. A decrease in score of 1 point or greater is considered a positive change.   Pulmonary Function Assessment:  Pulmonary Function Assessment - 02/02/24 1643       Initial Spirometry Results   FVC% 34 %    FEV1% 58 %    FEV1/FVC Ratio 35    Comments test 12/09/19      Post Bronchodilator Spirometry Results   FVC% 33 %    FEV1% 59 %    FEV1/FVC Ratio 37    Comments test 12/09/19      Breath   Shortness of Breath Yes;Fear of Shortness of Breath;Limiting activity;Panic with Shortness of Breath          Exercise Target Goals: Exercise  Program Goal: Individual exercise prescription set using results from initial 6 min walk test and  THRR while considering  patient's activity barriers and safety.   Exercise Prescription Goal: Initial exercise prescription builds to 30-45 minutes a day of aerobic activity, 2-3 days per week.  Home exercise guidelines will be given to patient during program as part of exercise prescription that the participant will acknowledge.  Activity Barriers & Risk Stratification:  Activity Barriers & Cardiac Risk Stratification - 01/30/24 0924       Activity Barriers & Cardiac Risk Stratification   Activity Barriers Deconditioning;Muscular Weakness;Shortness of Breath          6 Minute Walk:  6 Minute Walk     Row Name 02/02/24 1630         6 Minute Walk   Phase Initial     Distance 300 feet     Walk Time 3.33 minutes     # of Rest Breaks 5  21 sec, 36 sec, 20 sec, 53 sec, 30 sec (all standing rest)     MPH 1.02     METS 2.08     RPE 15     Perceived Dyspnea  3     VO2 Peak 7.3     Symptoms Yes (comment)     Comments SOB, chest tightness 6/10 hard to breathe     Resting HR 98 bpm     Resting BP 126/74     Resting Oxygen  Saturation  98 %     Exercise Oxygen  Saturation  during 6 min walk 93 %     Max Ex. HR 125 bpm     Max Ex. BP 142/84     2 Minute Post BP 132/64       Interval HR   1 Minute HR 125     2 Minute HR 117     3 Minute HR 109     4 Minute HR 114     5 Minute HR 110     6 Minute HR 112     2 Minute Post HR 105     Interval Heart Rate? Yes       Interval Oxygen    Interval Oxygen ? Yes     Baseline Oxygen  Saturation % 98 %  3L NCC     1 Minute Oxygen  Saturation % 95 %     1 Minute Liters of Oxygen  4 L  NCC     2 Minute Oxygen  Saturation % 97 %     2 Minute Liters of Oxygen  4 L     3 Minute Oxygen  Saturation % 95 %     3 Minute Liters of Oxygen  4 L     4 Minute Oxygen  Saturation % 97 %     4 Minute Liters of Oxygen  4 L     5 Minute Oxygen  Saturation % 98 %      5 Minute Liters of Oxygen  4 L     6 Minute Oxygen  Saturation % 93 %     6 Minute Liters of Oxygen  4 L     2 Minute Post Oxygen  Saturation % 97 %     2 Minute Post Liters of Oxygen  4 L        Oxygen  Initial Assessment:  Oxygen  Initial Assessment - 01/30/24 0921       Home Oxygen    Home Oxygen  Device Home Concentrator;E-Tanks;Portable Concentrator    Sleep Oxygen  Prescription Continuous   ventilator machine for 5 hrs a day   Liters per minute 3    Home Exercise Oxygen  Prescription Continuous  Liters per minute 3   4 liters for shower and walking outside (up hill)   Home Resting Oxygen  Prescription Continuous    Liters per minute 3    Compliance with Home Oxygen  Use Yes   usually compliant     Initial 6 min Walk   Oxygen  Used E-Tanks;Continuous    Liters per minute 4      Program Oxygen  Prescription   Program Oxygen  Prescription E-Tanks;Continuous    Liters per minute 4      Intervention   Short Term Goals To learn and exhibit compliance with exercise, home and travel O2 prescription;To learn and understand importance of monitoring SPO2 with pulse oximeter and demonstrate accurate use of the pulse oximeter.;To learn and understand importance of maintaining oxygen  saturations>88%;To learn and demonstrate proper pursed lip breathing techniques or other breathing techniques. ;To learn and demonstrate proper use of respiratory medications    Long  Term Goals Exhibits compliance with exercise, home  and travel O2 prescription;Maintenance of O2 saturations>88%;Compliance with respiratory medication;Verbalizes importance of monitoring SPO2 with pulse oximeter and return demonstration;Exhibits proper breathing techniques, such as pursed lip breathing or other method taught during program session;Demonstrates proper use of MDI's          Oxygen  Re-Evaluation:  Oxygen  Re-Evaluation     Row Name 03/29/24 0936 04/12/24 1152 05/19/24 1019 06/07/24 1034       Program Oxygen   Prescription   Program Oxygen  Prescription E-Tanks;Continuous E-Tanks;Continuous E-Tanks;Continuous E-Tanks;Continuous    Liters per minute 3 3 3 3       Home Oxygen    Home Oxygen  Device Home Concentrator;E-Tanks;Portable Concentrator Home Concentrator;E-Tanks;Portable Concentrator Home Concentrator;E-Tanks;Portable Concentrator Home Concentrator;E-Tanks;Portable Concentrator    Sleep Oxygen  Prescription -- Continuous Continuous Continuous    Liters per minute 3 3 3 3     Home Exercise Oxygen  Prescription Continuous Continuous Continuous Continuous    Liters per minute 3 3 3 3     Home Resting Oxygen  Prescription Continuous Continuous Continuous Continuous    Liters per minute 3 3 3 3     Compliance with Home Oxygen  Use Yes Yes Yes Yes      Goals/Expected Outcomes   Short Term Goals To learn and exhibit compliance with exercise, home and travel O2 prescription;To learn and understand importance of monitoring SPO2 with pulse oximeter and demonstrate accurate use of the pulse oximeter.;To learn and understand importance of maintaining oxygen  saturations>88%;To learn and demonstrate proper pursed lip breathing techniques or other breathing techniques. ;To learn and demonstrate proper use of respiratory medications -- To learn and exhibit compliance with exercise, home and travel O2 prescription;To learn and understand importance of monitoring SPO2 with pulse oximeter and demonstrate accurate use of the pulse oximeter.;To learn and understand importance of maintaining oxygen  saturations>88%;To learn and demonstrate proper pursed lip breathing techniques or other breathing techniques. ;To learn and demonstrate proper use of respiratory medications To learn and exhibit compliance with exercise, home and travel O2 prescription;To learn and understand importance of monitoring SPO2 with pulse oximeter and demonstrate accurate use of the pulse oximeter.;To learn and understand importance of maintaining oxygen   saturations>88%;To learn and demonstrate proper pursed lip breathing techniques or other breathing techniques. ;To learn and demonstrate proper use of respiratory medications    Long  Term Goals Exhibits compliance with exercise, home  and travel O2 prescription;Maintenance of O2 saturations>88%;Compliance with respiratory medication;Verbalizes importance of monitoring SPO2 with pulse oximeter and return demonstration;Exhibits proper breathing techniques, such as pursed lip breathing or other method taught during program session;Demonstrates proper  use of MDI's -- Exhibits compliance with exercise, home  and travel O2 prescription;Maintenance of O2 saturations>88%;Compliance with respiratory medication;Verbalizes importance of monitoring SPO2 with pulse oximeter and return demonstration;Exhibits proper breathing techniques, such as pursed lip breathing or other method taught during program session;Demonstrates proper use of MDI's Exhibits compliance with exercise, home  and travel O2 prescription;Maintenance of O2 saturations>88%;Compliance with respiratory medication;Verbalizes importance of monitoring SPO2 with pulse oximeter and return demonstration;Exhibits proper breathing techniques, such as pursed lip breathing or other method taught during program session;Demonstrates proper use of MDI's    Comments Rick's breathing has been about the same since last goal check-in. He states with the heat it has been much more difficult and he doesn't go outside when it's hot. He is compliant with his oxygen  at 3L all the time. He takes all his respiratory medications as prescribed. He states when he gets SOB a lot of times he will just have to sit still and wait for his HR to go down. Encouraged to use the PLB to help also. Rick's breathing continues to be the same since the last goal check in. He stated that he has good days and bad days. The heat/humidity has made it difficult the past month and only goes outside if he  has to. He is compliant with 3L and does his PLB when he feels SOB. Dick is compliant with his oxygen  around the clock.  He is not active at home but was encouraged to do more to help with breathing.  He does use his PLB and it is helpful.  He does not use nebulizer daily and only when he needs it.  He was encouraged to use it daily and then extra when he needs it.  His sats are doing well. Dick is doing well with his oxygen . His sats have been in the low 90s.  He using his nebulizer routinely and using his inhalers regularly.  He uses his PLB to help.  He is somewhat compliant with his CPAP but does not like it and finds that it will spook him awake sometimes.    Goals/Expected Outcomes Short: Continue to attend rehab. Long: Use PLB to help with SOB. Short: Continue to attend rehab. Long: Use PLB to help with SOB. Short: Continue to use PLB routinely Long: Use nebulizer more consistently Short: Continue to use treatments Long: Continue to work on pursed lip breathing       Oxygen  Discharge (Final Oxygen  Re-Evaluation):  Oxygen  Re-Evaluation - 06/07/24 1034       Program Oxygen  Prescription   Program Oxygen  Prescription E-Tanks;Continuous    Liters per minute 3      Home Oxygen    Home Oxygen  Device Home Concentrator;E-Tanks;Portable Concentrator    Sleep Oxygen  Prescription Continuous    Liters per minute 3    Home Exercise Oxygen  Prescription Continuous    Liters per minute 3    Home Resting Oxygen  Prescription Continuous    Liters per minute 3    Compliance with Home Oxygen  Use Yes      Goals/Expected Outcomes   Short Term Goals To learn and exhibit compliance with exercise, home and travel O2 prescription;To learn and understand importance of monitoring SPO2 with pulse oximeter and demonstrate accurate use of the pulse oximeter.;To learn and understand importance of maintaining oxygen  saturations>88%;To learn and demonstrate proper pursed lip breathing techniques or other breathing  techniques. ;To learn and demonstrate proper use of respiratory medications    Long  Term Goals Exhibits compliance  with exercise, home  and travel O2 prescription;Maintenance of O2 saturations>88%;Compliance with respiratory medication;Verbalizes importance of monitoring SPO2 with pulse oximeter and return demonstration;Exhibits proper breathing techniques, such as pursed lip breathing or other method taught during program session;Demonstrates proper use of MDI's    Comments Dick is doing well with his oxygen . His sats have been in the low 90s.  He using his nebulizer routinely and using his inhalers regularly.  He uses his PLB to help.  He is somewhat compliant with his CPAP but does not like it and finds that it will spook him awake sometimes.    Goals/Expected Outcomes Short: Continue to use treatments Long: Continue to work on pursed lip breathing          Initial Exercise Prescription:  Initial Exercise Prescription - 02/02/24 1600       Date of Initial Exercise RX and Referring Provider   Date 02/02/24    Referring Provider [name] MD      Oxygen    Oxygen  Continuous    Liters 4    Maintain Oxygen  Saturation 88% or higher      NuStep   Level 1    SPM 60    Minutes 30    METs 1.2      Track   Laps 2    Minutes 5    METs 1      Prescription Details   Frequency (times per week) 2    Duration Progress to 30 minutes of continuous aerobic without signs/symptoms of physical distress      Intensity   THRR 40-80% of Max Heartrate 120-142    Ratings of Perceived Exertion 11-13    Perceived Dyspnea 0-4      Progression   Progression Continue to progress workloads to maintain intensity without signs/symptoms of physical distress.      Resistance Training   Training Prescription Yes    Weight 3 lb    Reps 10-15          Perform Capillary Blood Glucose checks as needed.  Exercise Prescription Changes:   Exercise Prescription Changes     Row Name 02/02/24 1600  02/04/24 1300 02/18/24 1300 03/01/24 1500 03/29/24 1500     Response to Exercise   Blood Pressure (Admit) 126/74 128/58 110/60 128/74 126/64   Blood Pressure (Exercise) 142/84 134/70 120/60 130/60 --   Blood Pressure (Exit) 132/72 120/62 112/66 134/60 108/64   Heart Rate (Admit) 98 bpm 106 bpm 110 bpm 120 bpm 121 bpm   Heart Rate (Exercise) 125 bpm 106 bpm 116 bpm 124 bpm 109 bpm   Heart Rate (Exit) 104 bpm 100 bpm 116 bpm 120 bpm 111 bpm   Oxygen  Saturation (Admit) 98 % 94 % 91 % 90 % 91 %   Oxygen  Saturation (Exercise) 93 % 100 % 96 % 91 % 93 %   Oxygen  Saturation (Exit) 97 % 98 % 94 % 93 % 95 %   Rating of Perceived Exertion (Exercise) 15 15 14 15 13    Perceived Dyspnea (Exercise) 3 2 3 3 3    Symptoms standing rest breaks, SOB, chest tightness 6/10 (normal) -- -- -- --   Comments walk test results -- -- -- --   Duration -- Continue with 30 min of aerobic exercise without signs/symptoms of physical distress. Continue with 30 min of aerobic exercise without signs/symptoms of physical distress. Continue with 30 min of aerobic exercise without signs/symptoms of physical distress. Continue with 30 min of aerobic exercise without  signs/symptoms of physical distress.   Intensity -- THRR unchanged THRR unchanged THRR unchanged THRR unchanged     Progression   Progression -- Continue to progress workloads to maintain intensity without signs/symptoms of physical distress. Continue to progress workloads to maintain intensity without signs/symptoms of physical distress. Continue to progress workloads to maintain intensity without signs/symptoms of physical distress. Continue to progress workloads to maintain intensity without signs/symptoms of physical distress.     Resistance Training   Training Prescription -- Yes Yes Yes Yes   Weight -- 3 3 3 3    Reps -- 10-15 10-15 10-15 10-15     Oxygen    Oxygen  -- Continuous Continuous Continuous Continuous   Liters -- 4 4 3 4      NuStep   Level -- 1 1  -- 2   SPM -- 36 45 -- 43   Minutes -- 30 30 -- 30   METs -- 1.3 1.5 -- 1.5     Oxygen    Maintain Oxygen  Saturation -- 88% or higher 88% or higher -- 88% or higher    Row Name 04/12/24 1300 05/07/24 0800           Response to Exercise   Blood Pressure (Admit) 130/68 140/60      Blood Pressure (Exit) 136/62 119/76      Heart Rate (Admit) 115 bpm 113 bpm      Heart Rate (Exercise) 121 bpm 116 bpm      Heart Rate (Exit) 109 bpm 109 bpm      Oxygen  Saturation (Admit) 90 % 93 %      Oxygen  Saturation (Exercise) 90 % 96 %      Oxygen  Saturation (Exit) 92 % 97 %      Rating of Perceived Exertion (Exercise) 14 15      Perceived Dyspnea (Exercise) 2 2      Duration Continue with 30 min of aerobic exercise without signs/symptoms of physical distress. Continue with 30 min of aerobic exercise without signs/symptoms of physical distress.      Intensity THRR unchanged THRR unchanged        Progression   Progression Continue to progress workloads to maintain intensity without signs/symptoms of physical distress. Continue to progress workloads to maintain intensity without signs/symptoms of physical distress.        Resistance Training   Training Prescription Yes Yes      Weight 3 3      Reps 10-15 10-15        Oxygen    Oxygen  Continuous Continuous      Liters 3 3        NuStep   Level 2 2      SPM 52 50      Minutes 30 30      METs 1.6 1.7        Oxygen    Maintain Oxygen  Saturation 88% or higher 88% or higher         Exercise Comments:   Exercise Goals and Review:   Exercise Goals     Row Name 02/02/24 1636             Exercise Goals   Increase Physical Activity Yes       Intervention Provide advice, education, support and counseling about physical activity/exercise needs.;Develop an individualized exercise prescription for aerobic and resistive training based on initial evaluation findings, risk stratification, comorbidities and participant's personal goals.        Expected Outcomes Short Term: Attend rehab on  a regular basis to increase amount of physical activity.;Long Term: Add in home exercise to make exercise part of routine and to increase amount of physical activity.;Long Term: Exercising regularly at least 3-5 days a week.       Increase Strength and Stamina Yes       Intervention Provide advice, education, support and counseling about physical activity/exercise needs.;Develop an individualized exercise prescription for aerobic and resistive training based on initial evaluation findings, risk stratification, comorbidities and participant's personal goals.       Expected Outcomes Short Term: Increase workloads from initial exercise prescription for resistance, speed, and METs.;Short Term: Perform resistance training exercises routinely during rehab and add in resistance training at home;Long Term: Improve cardiorespiratory fitness, muscular endurance and strength as measured by increased METs and functional capacity ( )       Able to understand and use rate of perceived exertion (RPE) scale Yes       Intervention Provide education and explanation on how to use RPE scale       Expected Outcomes Long Term:  Able to use RPE to guide intensity level when exercising independently;Short Term: Able to use RPE daily in rehab to express subjective intensity level       Able to understand and use Dyspnea scale Yes       Intervention Provide education and explanation on how to use Dyspnea scale       Expected Outcomes Short Term: Able to use Dyspnea scale daily in rehab to express subjective sense of shortness of breath during exertion;Long Term: Able to use Dyspnea scale to guide intensity level when exercising independently       Knowledge and understanding of Target Heart Rate Range (THRR) Yes       Intervention Provide education and explanation of THRR including how the numbers were predicted and where they are located for reference       Expected Outcomes Short  Term: Able to state/look up THRR;Short Term: Able to use daily as guideline for intensity in rehab;Long Term: Able to use THRR to govern intensity when exercising independently       Able to check pulse independently Yes       Intervention Provide education and demonstration on how to check pulse in carotid and radial arteries.;Review the importance of being able to check your own pulse for safety during independent exercise       Expected Outcomes Short Term: Able to explain why pulse checking is important during independent exercise;Long Term: Able to check pulse independently and accurately       Understanding of Exercise Prescription Yes       Intervention Provide education, explanation, and written materials on patient's individual exercise prescription       Expected Outcomes Short Term: Able to explain program exercise prescription;Long Term: Able to explain home exercise prescription to exercise independently          Exercise Goals Re-Evaluation :  Exercise Goals Re-Evaluation     Row Name 03/03/24 1151 03/29/24 0935 04/12/24 1142 05/19/24 1007 06/07/24 0942     Exercise Goal Re-Evaluation   Exercise Goals Review Increase Physical Activity;Increase Strength and Stamina;Understanding of Exercise Prescription Increase Strength and Stamina;Increase Physical Activity;Able to understand and use rate of perceived exertion (RPE) scale;Able to understand and use Dyspnea scale;Understanding of Exercise Prescription Increase Physical Activity;Increase Strength and Stamina;Understanding of Exercise Prescription Increase Physical Activity;Increase Strength and Stamina;Understanding of Exercise Prescription Increase Physical Activity;Increase Strength and Stamina;Understanding of Exercise Prescription  Comments Dick is doing well in rehab. He has not increased his level on the nustep but his SPM are increasing slightly. He is exercsing at a RPE of 13. He stated that he does feel a difference in his  energy levels after exercise. We talking about energy saviing techniques when doing activities such as grocery shopping. Dick is doing well in rehab. He states he does not do any exercise at home that this is his only exercise regimen. He is slowly increasing his SPM on the Nustep. He does have to take breaks often due to SOB. Dick in doing well in rehab. He is deconditioned but has been improving in rehab. He continues to work on his SPM . He gets very winded during exercise and focuses on hos PLB. Will continue to montior and progress as able Dick has been doing well in rehab. He is still no doing much at home and was encouraged to move more at home especially on days he feels better.  He is limited by his breathing and fatigue.  We talked about not sitting as much as it does nothing to build his strength and stamina.  He was open to trying to move more. Dick is back today after colonscopy and CT scans last week.  He was encouraged to continue to exercise to [facility] his stamina.  He is not doing much on his off days currently as he is not breathing as well.   Expected Outcomes Short: practice energy techniques   long term: continue to attend rehab. Short: Continue to attend rehab to increase strength and stamina. Long: Incorporate exercise at home. Short: Continue to attend rehab to increase strength and stamina. Long: Incorporate exercise at home. Short; Move more at home Long; Continue to improve strength Short: Move more on good days Long: Continue to exercise      Discharge Exercise Prescription (Final Exercise Prescription Changes):  Exercise Prescription Changes - 05/07/24 0800       Response to Exercise   Blood Pressure (Admit) 140/60    Blood Pressure (Exit) 119/76    Heart Rate (Admit) 113 bpm    Heart Rate (Exercise) 116 bpm    Heart Rate (Exit) 109 bpm    Oxygen  Saturation (Admit) 93 %    Oxygen  Saturation (Exercise) 96 %    Oxygen  Saturation (Exit) 97 %    Rating of Perceived Exertion  (Exercise) 15    Perceived Dyspnea (Exercise) 2    Duration Continue with 30 min of aerobic exercise without signs/symptoms of physical distress.    Intensity THRR unchanged      Progression   Progression Continue to progress workloads to maintain intensity without signs/symptoms of physical distress.      Resistance Training   Training Prescription Yes    Weight 3    Reps 10-15      Oxygen    Oxygen  Continuous    Liters 3      NuStep   Level 2    SPM 50    Minutes 30    METs 1.7      Oxygen    Maintain Oxygen  Saturation 88% or higher          Nutrition:  Target Goals: Understanding of nutrition guidelines, daily intake of sodium 1500mg , cholesterol 200mg , calories 30% from fat and 7% or less from saturated fats, daily to have 5 or more servings of fruits and vegetables.  Biometrics:  Pre Biometrics - 02/02/24 1637       Pre  Biometrics   Height 5' 9 (1.753 m)    Weight 149 lb 9.6 oz (67.9 kg)    Waist Circumference 34 inches    Hip Circumference 37.5 inches    Waist to Hip Ratio 0.91 %    BMI (Calculated) 22.08    Grip Strength 37.3 kg    Single Leg Stand 20.9 seconds           Nutrition Therapy Plan and Nutrition Goals:  Nutrition Therapy & Goals - 01/30/24 1007       Intervention Plan   Intervention Prescribe, educate and counsel regarding individualized specific dietary modifications aiming towards targeted core components such as weight, hypertension, lipid management, diabetes, heart failure and other comorbidities.;Nutrition handout(s) given to patient.    Expected Outcomes Short Term Goal: Understand basic principles of dietary content, such as calories, fat, sodium, cholesterol and nutrients.;Long Term Goal: Adherence to prescribed nutrition plan.          Nutrition Assessments:  MEDIFICTS Score Key: >=70 Need to make dietary changes  40-70 Heart Healthy Diet <= 40 Therapeutic Level Cholesterol Diet  Flowsheet Row PULMONARY REHAB COPD  ORIENTATION from 02/02/2024 in [facility]  Picture Your Plate Total Score on Admission 50   Picture Your Plate Scores: <59 Unhealthy dietary pattern with much room for improvement. 41-50 Dietary pattern unlikely to meet recommendations for good health and room for improvement. 51-60 More healthful dietary pattern, with some room for improvement.  >60 Healthy dietary pattern, although there may be some specific behaviors that could be improved.    Nutrition Goals Re-Evaluation:  Nutrition Goals Re-Evaluation     Row Name 03/03/24 1321 03/29/24 0932 04/12/24 1147 05/19/24 1011 06/07/24 0957     Goals   Nutrition Goal Healthy eating Healthy eating. Healthy eating. Short: increase water intake. Long: try to choose healthy options for meals. Short; Continue to try to drink more water Long: Continue to focus on eating around the clock   Comment Dick is doing well in rehab. He stated that he is eating wha he likes to eat and really only eats about two meals a day. He does not drink a lot of water and we talked about increasing his water intake. He does drink diet sodas throughtout the day. Dick states he is trying to eat healthy for the most part, he doesn't drink a lot of water, mostly tea. States he feels he is staying hydrated enough during the heat. Encouraged to drink more water. Dick is doing well in rehab. He states that he is trying to eat healthy and pick healthier options for meals. He continues to not drink much water but does drink sweet tea throughout the day. He has cut back on sodas. He was encourged to drink more water throughtout the day. Dick is still not drinking enough water.  We talked about how more water will help with his fluid balance and breathing some. He is also trying to add in some more protein. He eats when he is hungry but still not eating a lot. Dick trying to eat some. He was out with colonoscopy.  He is still drinking ensure every day and working on  trying to gain weight.  He is now being worked up for colon cancer and was encouraged to make sure he continues to eat.   Expected Outcome Short: increase water intake long term: try to choose healthy options for meals Short: increase water intake. Long: try to choose healthy options for meals. Short:  increase water intake. Long: try to choose healthy options for meals. Short; Continue to try to drink more water Long: Continue to focus on eating around the clock Short: Continue to drink Ensure for nutrition Long: Continue to drink more      Nutrition Goals Discharge (Final Nutrition Goals Re-Evaluation):  Nutrition Goals Re-Evaluation - 06/07/24 0957       Goals   Nutrition Goal Short; Continue to try to drink more water Long: Continue to focus on eating around the clock    Comment Dick trying to eat some. He was out with colonoscopy.  He is still drinking ensure every day and working on trying to gain weight.  He is now being worked up for colon cancer and was encouraged to make sure he continues to eat.    Expected Outcome Short: Continue to drink Ensure for nutrition Long: Continue to drink more          Psychosocial: Target Goals: Acknowledge presence or absence of significant depression and/or stress, maximize coping skills, provide positive support system. Participant is able to verbalize types and ability to use techniques and skills needed for reducing stress and depression.  Initial Review & Psychosocial Screening:  Initial Psych Review & Screening - 01/30/24 0925       Initial Review   Current issues with Current Sleep Concerns;Current Stress Concerns    Source of Stress Concerns Chronic Illness;Unable to participate in former interests or hobbies;Unable to perform yard/household activities    Comments chronic bad sleeper, wife wants him to use his ventilator at home regularly      Family Dynamics   Good Support System? Yes   wife, daughter     Barriers   Psychosocial  barriers to participate in program The patient should benefit from training in stress management and relaxation.;Psychosocial barriers identified (see note)      Screening Interventions   Interventions Encouraged to exercise;To provide support and resources with identified psychosocial needs;Provide feedback about the scores to participant    Expected Outcomes Short Term goal: Utilizing psychosocial counselor, staff and physician to assist with identification of specific Stressors or current issues interfering with healing process. Setting desired goal for each stressor or current issue identified.;Long Term Goal: Stressors or current issues are controlled or eliminated.;Short Term goal: Identification and review with participant of any Quality of Life or Depression concerns found by scoring the questionnaire.;Long Term goal: The participant improves quality of Life and PHQ9 Scores as seen by post scores and/or verbalization of changes          Quality of Life Scores:  Scores of 19 and below usually indicate a poorer quality of life in these areas.  A difference of  2-3 points is a clinically meaningful difference.  A difference of 2-3 points in the total score of the Quality of Life Index has been associated with significant improvement in overall quality of life, self-image, physical symptoms, and general health in studies assessing change in quality of life.   PHQ-9: Review Flowsheet  More data exists      06/22/2024 01/30/2024 09/10/2023 04/16/2023 10/14/2022  Depression screen PHQ 2/9  Decreased Interest 0 0 0 0 0  Down, Depressed, Hopeless 0 0 0 0 0  PHQ - 2 Score 0 0 0 0 0  Altered sleeping - 0 0 - -  Tired, decreased energy - 2 0 - -  Change in appetite - 1 0 - -  Feeling bad or failure about yourself  - 0  0 - -  Trouble concentrating - 0 0 - -  Moving slowly or fidgety/restless - 0 0 - -  Suicidal thoughts - 0 0 - -  PHQ-9 Score - 3 0 - -  Difficult doing work/chores - Not  difficult at all Not difficult at all - -   Interpretation of Total Score  Total Score Depression Severity:  1-4 = Minimal depression, 5-9 = Mild depression, 10-14 = Moderate depression, 15-19 = Moderately severe depression, 20-27 = Severe depression   Psychosocial Evaluation and Intervention:  Psychosocial Evaluation - 01/30/24 1002       Psychosocial Evaluation & Interventions   Interventions Encouraged to exercise with the program and follow exercise prescription    Comments Dick is coming into pulmonary rehab for COPD.  He has a long history and has previously been worked up for transplant, but was disqualified after finding an aneursym.  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  He lives with his wife and has his daughter and her son to check on them frequently.  They have no pets or environmental concerns at home. He does use a scooter to get around due to his SOB.  His wife will help him in the shower, but otherwise he is independently with ADLs.  He is 3L oxygen  [facility] 24/7. He will use 4L in shower and walking outside.  He is not able to go and do as he pleases due to his breathing.  He wants to be able to do more and not get so SOB.  They came to look at program prior to signing up and were pleased.  He did rehab before at [facility] prior to transplant work up. They have no barriers to attending other than $15 copay. We talked about how it can be set up as payment plan with hospital as well.    Expected Outcomes Short: Attend rehab regularly Long: Improve stamina and breathing    Continue Psychosocial Services  Follow up required by staff          Psychosocial Re-Evaluation:  Psychosocial Re-Evaluation     Row Name 03/03/24 1258 03/29/24 0931 04/12/24 1144 05/19/24 1009 06/07/24 0952     Psychosocial Re-Evaluation   Current issues with None Identified None Identified Current Stress Concerns;Current Sleep Concerns Current Stress Concerns;Current Sleep Concerns Current Stress  Concerns;Current Sleep Concerns   Comments Dick is doing well in rehab. He stated that he does not have very many issues with his sleep. He does get up during the night to use the restroom but is able to get back to sleep. He also stated that he does not have much stress in his life. He has his everyday worrying about his breathing but other then that he isnt very stressed. Dick is doing well in rehab. He identifies no stressors in his life. He states he is sleeping ok, he is a very light sleeper so he will wake up easily during the night. Dick is doing well in rehab. He stated that he get 4 hours of sleep and does okay. He is not napping during the day. He recently had a PET scan done of his intestines and said that they found something but was unsure. He is not really worried or stressed Dick is doing well in rehab.  He still does not sleep well and usually up at 3am each day. He was encouraged to try to nap in his recliner.  He notes that he gets clogged when sleeping on  side.  He is worried about his colonoscopy next week to get better look at mass in intestines.  He is really worried about whether or not he will tolerate procedure and wake up after.  He was encouraged to let us  know how it goes and we talked about it being better to be aware of what is going on for him. Dick was just diagnosed with colon cancer and is awaiting to meet with oncology and surgeon.  He is over whelmed with the news and trying to get all the information he can.  We talked through some of the alternatives and he was encouraged to meet with doctors to discuss prognosis and options.  His cardiologist was encouraging as well.  He is still not sleeping well, and the new news is also weighing on him.  He is meeting with his pulmonologist this week.  He is praying for guideance   Expected Outcomes Short: continue to have outlets for stress   long : continue to exericse for ovr all stress relef Short: Continue to attend rehab.  Long:Continue to maintain a stress free lifestyle. Short: Continue to attend rehab. Long:Continue to maintain a stress free lifestyle. Short; Get through colonoscopy Long: Continue to work on sleep Short: Get information to make decision Long: Continue to cope with new decisions   Interventions Encouraged to attend Pulmonary Rehabilitation for the exercise Encouraged to attend Pulmonary Rehabilitation for the exercise Encouraged to attend Pulmonary Rehabilitation for the exercise Encouraged to attend Pulmonary Rehabilitation for the exercise Encouraged to attend Pulmonary Rehabilitation for the exercise   Continue Psychosocial Services  Follow up required by staff Follow up required by staff Follow up required by staff Follow up required by staff Follow up required by staff      Psychosocial Discharge (Final Psychosocial Re-Evaluation):  Psychosocial Re-Evaluation - 06/07/24 0952       Psychosocial Re-Evaluation   Current issues with Current Stress Concerns;Current Sleep Concerns    Comments Dick was just diagnosed with colon cancer and is awaiting to meet with oncology and surgeon.  He is over whelmed with the news and trying to get all the information he can.  We talked through some of the alternatives and he was encouraged to meet with doctors to discuss prognosis and options.  His cardiologist was encouraging as well.  He is still not sleeping well, and the new news is also weighing on him.  He is meeting with his pulmonologist this week.  He is praying for guideance    Expected Outcomes Short: Get information to make decision Long: Continue to cope with new decisions    Interventions Encouraged to attend Pulmonary Rehabilitation for the exercise    Continue Psychosocial Services  Follow up required by staff           Education: Education Goals: Education classes will be provided on a weekly basis, covering required topics. Participant will state understanding/return demonstration of  topics presented.  Learning Barriers/Preferences:  Learning Barriers/Preferences - 01/30/24 0924       Learning Barriers/Preferences   Learning Barriers Sight   glasses   Learning Preferences Skilled Demonstration          Education Topics: How Lungs Work and Diseases: - Discuss the anatomy of the lungs and diseases that can affect the lungs, such as COPD.   Exercise: -Discuss the importance of exercise, FITT principles of exercise, normal and abnormal responses to exercise, and how to exercise safely.   Environmental Irritants: -Discuss types of  environmental irritants and how to limit exposure to environmental irritants.   Meds/Inhalers and oxygen : - Discuss respiratory medications, definition of an inhaler and oxygen , and the proper way to use an inhaler and oxygen .   Energy Saving Techniques: - Discuss methods to conserve energy and decrease shortness of breath when performing activities of daily living.  Flowsheet Row PULMONARY REHAB CHRONIC OBSTRUCTIVE PULMONARY DISEASE from 04/21/2024 in [place] [facility]  Date 03/17/24  Educator HB  Instruction Review Code 1- Verbalizes Understanding    Bronchial Hygiene / Breathing Techniques: - Discuss breathing mechanics, pursed-lip breathing technique,  proper posture, effective ways to clear airways, and other functional breathing techniques   Cleaning Equipment: - Provides group verbal and written instruction about the health risks of elevated stress, cause of high stress, and healthy ways to reduce stress.   Nutrition I: Fats: - Discuss the types of cholesterol, what cholesterol does to the body, and how cholesterol levels can be controlled. Flowsheet Row PULMONARY REHAB CHRONIC OBSTRUCTIVE PULMONARY DISEASE from 04/21/2024 in [place] [facility]  Date 03/31/24  Educator jh  Instruction Review Code 1- Verbalizes Understanding    Nutrition II: Labels: -Discuss the different components  of food labels and how to read food labels. Flowsheet Row PULMONARY REHAB CHRONIC OBSTRUCTIVE PULMONARY DISEASE from 04/21/2024 in [place] [facility]  Date 03/31/24  Educator jh  Instruction Review Code 1- Verbalizes Understanding    Respiratory Infections: - Discuss the signs and symptoms of respiratory infections, ways to prevent respiratory infections, and the importance of seeking medical treatment when having a respiratory infection.   Stress I: Signs and Symptoms: - Discuss the causes of stress, how stress may lead to anxiety and depression, and ways to limit stress. Flowsheet Row PULMONARY REHAB CHRONIC OBSTRUCTIVE PULMONARY DISEASE from 04/21/2024 in [place] [facility]  Date 02/04/24  Educator [facility]  Instruction Review Code 1- Verbalizes Understanding    Stress II: Relaxation: -Discuss relaxation techniques to limit stress. Flowsheet Row PULMONARY REHAB CHRONIC OBSTRUCTIVE PULMONARY DISEASE from 04/21/2024 in [place] [facility]  Date 02/04/24  Educator [facility]  Instruction Review Code 1- Verbalizes Understanding    Oxygen  for Home/Travel: - Discuss how to prepare for travel when on oxygen  and proper ways to transport and store oxygen  to ensure safety.   Knowledge Questionnaire Score:  Knowledge Questionnaire Score - 02/02/24 1638       Knowledge Questionnaire Score   Pre Score 15/18          Core Components/Risk Factors/Patient Goals at Admission:  Personal Goals and Risk Factors at Admission - 02/02/24 1639       Core Components/Risk Factors/Patient Goals on Admission    Weight Management Yes;Weight Maintenance    Intervention Weight Management: Develop a combined nutrition and exercise program designed to reach desired caloric intake, while maintaining appropriate intake of nutrient and fiber, sodium and fats, and appropriate energy expenditure required for the weight goal.;Weight Management: Provide education and appropriate  resources to help participant work on and attain dietary goals.    Admit Weight 149 lb 9.6 oz (67.9 kg)    Goal Weight: Short Term 150 lb (68 kg)    Goal Weight: Long Term 150 lb (68 kg)    Expected Outcomes Short Term: Continue to assess and modify interventions until short term weight is achieved;Long Term: Adherence to nutrition and physical activity/exercise program aimed toward attainment of established weight goal;Weight Maintenance: Understanding of the daily nutrition guidelines, which includes 25-35% calories from fat,  7% or less cal from saturated fats, less than 200mg  cholesterol, less than 1.5gm of sodium, & 5 or more servings of fruits and vegetables daily    Improve shortness of breath with ADL's Yes    Intervention Provide education, individualized exercise plan and daily activity instruction to help decrease symptoms of SOB with activities of daily living.    Expected Outcomes Short Term: Improve cardiorespiratory fitness to achieve a reduction of symptoms when performing ADLs;Long Term: Be able to perform more ADLs without symptoms or delay the onset of symptoms    Increase knowledge of respiratory medications and ability to use respiratory devices properly  Yes    Intervention Provide education and demonstration as needed of appropriate use of medications, inhalers, and oxygen  therapy.    Expected Outcomes Short Term: Achieves understanding of medications use. Understands that oxygen  is a medication prescribed by physician. Demonstrates appropriate use of inhaler and oxygen  therapy.;Long Term: Maintain appropriate use of medications, inhalers, and oxygen  therapy.    Hypertension Yes    Intervention Provide education on lifestyle modifcations including regular physical activity/exercise, weight management, moderate sodium restriction and increased consumption of fresh fruit, vegetables, and low fat dairy, alcohol moderation, and smoking cessation.;Monitor prescription use compliance.     Expected Outcomes Short Term: Continued assessment and intervention until BP is < 140/68mm HG in hypertensive participants. < 130/72mm HG in hypertensive participants with diabetes, heart failure or chronic kidney disease.;Long Term: Maintenance of blood pressure at goal levels.    Lipids Yes    Intervention Provide education and support for participant on nutrition & aerobic/resistive exercise along with prescribed medications to achieve LDL 70mg , HDL >40mg .    Expected Outcomes Short Term: Participant states understanding of desired cholesterol values and is compliant with medications prescribed. Participant is following exercise prescription and nutrition guidelines.;Long Term: Cholesterol controlled with medications as prescribed, with individualized exercise RX and with personalized nutrition plan. Value goals: LDL < 70mg , HDL > 40 mg.          Core Components/Risk Factors/Patient Goals Review:   Goals and Risk Factor Review     Row Name 03/03/24 1324 03/29/24 0933 04/12/24 1150 05/19/24 1013 06/07/24 1027     Core Components/Risk Factors/Patient Goals Review   Personal Goals Review Weight Management/Obesity;Stress;Improve shortness of breath with ADL's Weight Management/Obesity;Improve shortness of breath with ADL's;Develop more efficient breathing techniques such as purse lipped breathing and diaphragmatic breathing and practicing self-pacing with activity. Weight Management/Obesity;Improve shortness of breath with ADL's;Increase knowledge of respiratory medications and ability to use respiratory devices properly.;Develop more efficient breathing techniques such as purse lipped breathing and diaphragmatic breathing and practicing self-pacing with activity. Weight Management/Obesity;Improve shortness of breath with ADL's;Increase knowledge of respiratory medications and ability to use respiratory devices properly.;Develop more efficient breathing techniques such as purse lipped breathing and  diaphragmatic breathing and practicing self-pacing with activity. Weight Management/Obesity;Improve shortness of breath with ADL's;Increase knowledge of respiratory medications and ability to use respiratory devices properly.;Develop more efficient breathing techniques such as purse lipped breathing and diaphragmatic breathing and practicing self-pacing with activity.   Review Dick is doing well in rehab. He Dick is doing well in rehab. He takes all his medications as prescribed. He checks his O2 at home and it often runs low like 85% when he has walked just a short distance. He doesn't check his BP at home, which is really hasn't been a problem for him here at rehab. Dick is doing well in rehab. He is taking all medications as  prescribed and continues to check his O2 stats outside of class. He is focusing on PLB when he gets SOB and when his oxygen  drops. He does not check his BP at home, his BP has not been an issue during rehab but it is encourged to check at home to know paitents resting numbers Dick is doing well in rehab.  He is gaining some weight.  He has been working on his breathing.  He still gets SOB easily and it is his biggest limitation.  He is working on PLB and finds it helpful.  He does note when he breathes through his mouth is upsets his stomach as he swallows air more then.  He was encouraged to use his nebulizer more regularly and extra when he needs it versus just when he needs it. Dick has just been diagnosed colon cancer.  His weight is down from not eating after his news.  He still has good days and bad days with breathing.  He does not like his CPAP machine and sleeps in chair with it.  He is good about using his breathing treatments.   Expected Outcomes -- Short: Continue to check O2 saturation at home. Long: Report abnormalities to all healthcare professionals. Short: Continue to check O2 saturation at home. Long: Report abnormalities to all healthcare professionals. Short: Use  nebulizer daily Long: Continue to work on using PLB Short; Find out more about his colon cancer Long: Continue use breathing treatments.      Core Components/Risk Factors/Patient Goals at Discharge (Final Review):   Goals and Risk Factor Review - 06/07/24 1027       Core Components/Risk Factors/Patient Goals Review   Personal Goals Review Weight Management/Obesity;Improve shortness of breath with ADL's;Increase knowledge of respiratory medications and ability to use respiratory devices properly.;Develop more efficient breathing techniques such as purse lipped breathing and diaphragmatic breathing and practicing self-pacing with activity.    Review Dick has just been diagnosed colon cancer.  His weight is down from not eating after his news.  He still has good days and bad days with breathing.  He does not like his CPAP machine and sleeps in chair with it.  He is good about using his breathing treatments.    Expected Outcomes Short; Find out more about his colon cancer Long: Continue use breathing treatments.          ITP Comments:  ITP Comments     Row Name 01/30/24 1011 02/02/24 1630 02/04/24 0913 02/11/24 0828 03/10/24 0754   ITP Comments Completed virtual orientation today in person as he came to see program prior to signing up.  EP evaluation is scheduled for Monday May 12 at 1300 .  Documentation for diagnosis can be found in [facility] encounter 01/26/24. Patient attend orientation today.  Patient is attending Pulmonary Rehabilitation Program.  Documentation for diagnosis can be found in [facility] OV 01/26/24.  Reviewed medical chart, RPE/RPD, gym safety, and program guidelines.  Patient was fitted to equipment they will be using during rehab.  Patient is scheduled to start exercise on Wed 5/14 at 915.   Initial ITP created and sent for review and signature by Dr. Anton Kelp, Medical Director for Pulmonary Rehabilitation Program. First full day of exercise!  Patient was oriented to gym and equipment  including functions, settings, policies, and procedures.  Patient's individual exercise prescription and treatment plan were reviewed.  All starting workloads were established based on the results of the 6 minute walk test done at initial orientation visit.  The plan for exercise progression was also introduced and progression will be customized based on patient's performance and goals. 30 day review completed. ITP sent to Dr.Jehanzeb Memon, Medical Director of  Pulmonary Rehab. Continue with ITP unless changes are made by physician.  New to program. 30 day review completed. ITP sent to Dr.Jehanzeb Memon, Medical Director of Pulmonary Rehab. Continue with ITP unless changes are made by physician.    Row Name 04/05/24 9061 04/07/24 0830 05/05/24 1002 06/02/24 1426 06/30/24 1012   ITP Comments Called to check on patient when he did not show for rehab today.  Daril was having a bad breathing day and did not feel up to coming to rehab today.  He hopes to return on Wednesday and was encouraged to use his nebulizer an extra time today. 30 day review completed. ITP sent to Dr.Jehanzeb Memon, Medical Director of  Pulmonary Rehab. Continue with ITP unless changes are made by physician. 30 day review completed. ITP sent to Dr.Jehanzeb Memon, Medical Director of  Pulmonary Rehab. Continue with ITP unless changes are made by physician. 30 day review completed. ITP sent to Dr.Jehanzeb Memon, Medical Director of  Pulmonary Rehab. Continue with ITP unless changes are made by physician. Has been out this week with appts. 30 day review completed. ITP sent to Dr.Jehanzeb Memon, Medical Director of  Pulmonary Rehab. Continue with ITP unless changes are made by physician.      Comments: 30 day review

## 2024-07-05 ENCOUNTER — Encounter (HOSPITAL_COMMUNITY)
Admission: RE | Admit: 2024-07-05 | Discharge: 2024-07-05 | Disposition: A | Discharge status: Home / Self Care | Source: Ambulatory Visit | Attending: Pulmonary Disease | Admitting: Pulmonary Disease

## 2024-07-05 DIAGNOSIS — J4489 Other specified chronic obstructive pulmonary disease: Secondary | ICD-10-CM

## 2024-07-05 NOTE — Progress Notes (Signed)
 Daily Session Note  Patient Details  Name: DASTAN KRIDER MRN: [id number] Date of Birth: [birth year] Referring Provider:   Flowsheet Row PULMONARY REHAB COPD ORIENTATION from 02/02/2024 in [facility] CARDIAC REHABILITATION  Referring Provider [name] MD    Encounter Date: 07/05/2024  Check In:  Session Check In - 07/05/24 0915       Check-In   Supervising physician immediately available to respond to emergencies See telemetry face sheet for immediately available MD    Location [facility]    Staff Present Powell Benders, BS, Exercise Physiologist;Lewi Drost Vonzell, MA, RCEP, CCRP, Sueellen Louder, RN, BSN    Virtual Visit No    Medication changes reported     No    Fall or balance concerns reported    No    Warm-up and Cool-down Performed on first and last piece of equipment    Resistance Training Performed Yes    VAD Patient? No    PAD/SET Patient? No      Pain Assessment   Currently in Pain? No/denies          Capillary Blood Glucose: No results found for this or any previous visit (from the past 24 hours).    Social History   Tobacco Use  Smoking Status Former   Current packs/day: 0.00   Average packs/day: 2.0 packs/day for 51.6 years (103.1 ttl pk-yrs)   Types: Cigarettes   Start date: 25   Quit date: 04/18/2020   Years since quitting: 4.2  Smokeless Tobacco Never  Tobacco Comments   2 plus packs per day. He has a 100 + pack - year history of tobacco abuse currently. Former 4 ppd for 25 years.    Goals Met:  Proper associated with RPD/PD & O2 Sat Independence with exercise equipment Using PLB without cueing & demonstrates good technique Exercise tolerated well No report of concerns or symptoms today Strength training completed today  Goals Unmet:  Not Applicable  Comments: Pt able to follow exercise prescription today without complaint.  Will continue to monitor for progression.

## 2024-07-12 ENCOUNTER — Encounter (HOSPITAL_COMMUNITY)

## 2024-07-14 ENCOUNTER — Encounter (HOSPITAL_COMMUNITY)

## 2024-07-19 ENCOUNTER — Encounter (HOSPITAL_COMMUNITY)
Admission: RE | Admit: 2024-07-19 | Discharge: 2024-07-19 | Disposition: A | Discharge status: Home / Self Care | Source: Ambulatory Visit | Attending: Pulmonary Disease | Admitting: Pulmonary Disease

## 2024-07-19 DIAGNOSIS — J4489 Other specified chronic obstructive pulmonary disease: Secondary | ICD-10-CM

## 2024-07-19 NOTE — Progress Notes (Signed)
 Daily Session Note  Patient Details  Name: John [name] MRN: [id number] Date of Birth: May 13, 1956 Referring Provider:   Flowsheet Row PULMONARY REHAB COPD ORIENTATION from 02/02/2024 in [facility] CARDIAC REHABILITATION  Referring Provider [name] MD    Encounter Date: 07/19/2024  Check In:  Session Check In - 07/19/24 0923       Check-In   Supervising physician immediately available to respond to emergencies See telemetry face sheet for immediately available MD    Location [facility]    Staff Present Powell Benders, BS, Exercise Physiologist;Margert Edsall Vonzell, MA, RCEP, CCRP, CCET;Brittany Jackquline, BSN, RN, WTA-C    Virtual Visit No    Medication changes reported     No    Fall or balance concerns reported    No    Warm-up and Cool-down Performed on first and last piece of equipment    Resistance Training Performed Yes    VAD Patient? No    PAD/SET Patient? No      Pain Assessment   Currently in Pain? No/denies          Capillary Blood Glucose: No results found for this or any previous visit (from the past 24 hours).    Social History   Tobacco Use  Smoking Status Former   Current packs/day: 0.00   Average packs/day: 2.0 packs/day for 51.6 years (103.1 ttl pk-yrs)   Types: Cigarettes   Start date: 27   Quit date: 04/18/2020   Years since quitting: 4.2  Smokeless Tobacco Never  Tobacco Comments   2 plus packs per day. He has a 100 + pack - year history of tobacco abuse currently. Former 4 ppd for 25 years.    Goals Met:  Proper associated with RPD/PD & O2 Sat Independence with exercise equipment Using PLB without cueing & demonstrates good technique Exercise tolerated well Personal goals reviewed No report of concerns or symptoms today Strength training completed today  Goals Unmet:  Not Applicable  Comments: Pt able to follow exercise prescription today without complaint.  Will continue to monitor for  progression.

## 2024-07-21 ENCOUNTER — Encounter (HOSPITAL_COMMUNITY)
Admission: RE | Admit: 2024-07-21 | Discharge: 2024-07-21 | Disposition: A | Discharge status: Home / Self Care | Source: Ambulatory Visit | Attending: Pulmonary Disease | Admitting: Pulmonary Disease

## 2024-07-21 DIAGNOSIS — J4489 Other specified chronic obstructive pulmonary disease: Secondary | ICD-10-CM | POA: Diagnosis not present

## 2024-07-21 NOTE — Progress Notes (Signed)
 Daily Session Note  Patient Details  Name: John [name] MRN: [id number] Date of Birth: [birth year] Referring Provider:   Flowsheet Row PULMONARY REHAB COPD ORIENTATION from 02/02/2024 in [facility] CARDIAC REHABILITATION  Referring Provider [name] MD    Encounter Date: 07/21/2024  Check In:  Session Check In - 07/21/24 0933       Check-In   Supervising physician immediately available to respond to emergencies See telemetry face sheet for immediately available MD    Location [facility]    Staff Present Laymon Rattler, BSN, RN, WTA-C;Heather Con, BS, Exercise Physiologist;Jessica Vonzell, MA, RCEP, CCRP, CCET    Virtual Visit No    Medication changes reported     No    Fall or balance concerns reported    No    Tobacco Cessation No Change    Warm-up and Cool-down Performed on first and last piece of equipment    Resistance Training Performed Yes    VAD Patient? No    PAD/SET Patient? No      Pain Assessment   Currently in Pain? No/denies          Capillary Blood Glucose: No results found for this or any previous visit (from the past 24 hours).    Social History   Tobacco Use  Smoking Status Former   Current packs/day: 0.00   Average packs/day: 2.0 packs/day for 51.6 years (103.1 ttl pk-yrs)   Types: Cigarettes   Start date: 39   Quit date: 04/18/2020   Years since quitting: 4.2  Smokeless Tobacco Never  Tobacco Comments   2 plus packs per day. He has a 100 + pack - year history of tobacco abuse currently. Former 4 ppd for 25 years.    Goals Met:  Proper associated with RPD/PD & O2 Sat Independence with exercise equipment Improved SOB with ADL's Using PLB without cueing & demonstrates good technique Exercise tolerated well No report of concerns or symptoms today Strength training completed today  Goals Unmet:  Not Applicable  Comments: Pt able to follow exercise prescription today without complaint.  Will continue to  monitor for progression.

## 2024-07-22 ENCOUNTER — Telehealth: Payer: Self-pay | Admitting: Cardiovascular Disease

## 2024-07-22 MED ORDER — FUROSEMIDE 40 MG PO TABS: 40.0000 mg | ORAL_TABLET | Freq: Every day | ORAL | 3 refills | Status: AC

## 2024-07-22 NOTE — Telephone Encounter (Signed)
*  STAT* If patient is at the pharmacy, call can be transferred to refill team.   1. Which medications need to be refilled? (please list name of each medication and dose if known) furosemide  (LASIX ) 40 MG tablet    2. Would you like to learn more about the convenience, safety, & potential cost savings by using the [facility]?    3. Are you open to using the [facility] (Type [facility]   4. Which pharmacy/location (including street and city if local pharmacy) is medication to be sent to? [id number] - [place], [place] - [address] AT [address] [facility]    5. Do they need a 30 day or 90 day supply? 90 day

## 2024-07-22 NOTE — Telephone Encounter (Signed)
 Pt's medication was sent to pt's pharmacy as requested. Confirmation received.

## 2024-07-26 ENCOUNTER — Encounter (HOSPITAL_COMMUNITY)

## 2024-07-28 ENCOUNTER — Encounter (HOSPITAL_COMMUNITY)
Admission: RE | Admit: 2024-07-28 | Discharge: 2024-07-28 | Disposition: A | Discharge status: Home / Self Care | Source: Ambulatory Visit | Attending: Pulmonary Disease | Admitting: Pulmonary Disease

## 2024-07-28 ENCOUNTER — Encounter (HOSPITAL_COMMUNITY): Payer: Self-pay

## 2024-07-28 DIAGNOSIS — J4489 Other specified chronic obstructive pulmonary disease: Secondary | ICD-10-CM | POA: Diagnosis not present

## 2024-07-28 NOTE — Progress Notes (Signed)
 Pulmonary Individual Treatment Plan  Patient Details  Name: John [name] MRN: [id number] Date of Birth: 1956/01/09 Referring Provider:   Flowsheet Row PULMONARY REHAB COPD ORIENTATION from 02/02/2024 in [facility] CARDI[facility] REH[facility]IL[facility]ATION  Referring Provider [name] MD    Initial Encounter Date:  Flowsheet Row PULMONARY REHAB COPD ORIENTATION from 02/02/2024 in [facility]  Date 02/02/24    Visit Diagnosis: Chronic bronchitis with COPD (chronic obstructive pulmonary disease) (HCC)  Patient's Home Medications on Admission:   Current Outpatient Medications:    albuterol  (PROAIR  HFA) 108 (90 Base) MCG/ACT inhaler, Inhale 2 puffs into the lungs every 4 (four) hours as needed for wheezing or shortness of breath. 2 puffs every 4 hours as needed only  if your can't catch your breath, Disp: 8 g, Rfl: 5   albuterol  (PROVENTIL ) (2.5 MG/3ML) 0.083% nebulizer solution, Take 3 mLs (2.5 mg total) by nebulization every 4 (four) hours as needed for wheezing or shortness of breath., Disp: 360 mL, Rfl: 5   aspirin  EC 81 MG tablet, Take 81 mg by mouth daily., Disp: , Rfl:    atorvastatin  (LIPITOR ) 40 MG tablet, Take 1 tablet (40 mg total) by mouth daily., Disp: 90 tablet, Rfl: 3   budesonide -glycopyrrolate -formoterol  (BREZTRI  AEROSPHERE) 160-9-4.8 MCG/ACT AERO inhaler, Inhale 2 puffs into the lungs in the morning and at bedtime., Disp: 10.7 g, Rfl: 11   furosemide  (LASIX ) 40 MG tablet, Take 1 tablet (40 mg total) by mouth daily., Disp: 90 tablet, Rfl: 3   guaiFENesin  (MUCINEX ) 600 MG 12 hr tablet, Take 2 tablets (1,200 mg total) by mouth 2 (two) times daily as needed for cough or to loosen phlegm., Disp: , Rfl:    Magnesium  250 MG CAPS, Take by mouth daily., Disp: , Rfl:    Multiple Vitamin (MULTIVITAMIN WITH MINERALS) TABS tablet, Take 1 tablet by mouth daily., Disp: , Rfl:    nitroGLYCERIN  (NITROSTAT ) 0.4 MG SL tablet, Place 1 tablet (0.4 mg total) under the tongue every 5  (five) minutes as needed for chest pain., Disp: 25 tablet, Rfl: 6   OXYGEN , Inhale 2-3 L into the lungs., Disp: , Rfl:   Past Medical History: Past Medical History:  Diagnosis Date   Abdominal aortic aneurysm (AAA)    EVAR Dr. Lanis  2023   Alcohol abuse 12/27/2010   Allergic rhinitis, cause unspecified 12/27/2010   Anal warts 04/11/2012   CAD, NATIVE VESSEL 10/04/2010   COPD (chronic obstructive pulmonary disease) (HCC)    Stage 4 uses o2 3 L continous   History of blood transfusion 03/2012   related to nose bleed   HYPERLIPIDEMIA-MIXED 10/04/2010   HYPERTENSION, BENIGN 10/04/2010   Myocardial infarction (HCC) 09/16/2010   x2   On home O2    Pneumonia    when I was a kid   Stented coronary artery    Mid LAD    TOBACCO ABUSE 10/04/2010    Tobacco Use: Social History   Tobacco Use  Smoking Status Former   Current packs/day: 0.00   Average packs/day: 2.0 packs/day for 51.6 years (103.1 ttl pk-yrs)   Types: Cigarettes   Start date: 2   Quit date: 04/18/2020   Years since quitting: 4.2  Smokeless Tobacco Never  Tobacco Comments   2 plus packs per day. He has a 100 + pack - year history of tobacco abuse currently. Former 4 ppd for 25 years.    Labs: Review Flowsheet  More data exists      Latest Ref Rng &  Units 02/12/2021 03/22/2022 07/28/2022 10/14/2022 10/21/2023  Labs for ITP Cardiac and Pulmonary Rehab  Cholestrol 100 - 199 mg/dL - 833  - 840  821   LDL (calc) 0 - 99 mg/dL - 76  - 72  81   HDL-C >39 mg/dL - 77  - 76  86   Trlycerides 0 - 149 mg/dL - 70  - 55  57   PH, Arterial 7.35 - 7.45 7.283  - 7.265  - -  PCO2 arterial 32 - 48 mmHg 80.9  - 80.0  - -  Bicarbonate 20.0 - 28.0 mmol/L 31.4  - 36.7  - -  TCO2 22 - 32 mmol/L - - 39  - -  O2 Saturation % 97.2  - 97  - -    Capillary Blood Glucose: Lab Results  Component Value Date   GLUCAP 185 (H) 04/25/2020   GLUCAP 80 04/25/2020   GLUCAP 80 04/25/2020   GLUCAP 131 (H) 04/24/2020   GLUCAP 176 (H)  04/24/2020     Pulmonary Assessment Scores:  Pulmonary Assessment Scores     Row Name 02/02/24 1640         ADL UCSD   ADL Phase Entry     SOB Score total 60     Rest 0     Walk 3     Stairs 4     Bath 4     Dress 2     Shop 3       CAT Score   CAT Score 17       mMRC Score   mMRC Score 4       UCSD: Self-administered rating of dyspnea associated with activities of daily living (ADLs) 6-point scale (0 = not at all to 5 = maximal or unable to do because of breathlessness)  Scoring Scores range from 0 to 120.  Minimally important difference is 5 units  CAT: CAT can identify the health impairment of COPD patients and is better correlated with disease progression.  CAT has a scoring range of zero to 40. The CAT score is classified into four groups of low (less than 10), medium (10 - 20), high (21-30) and very high (31-40) based on the impact level of disease on health status. A CAT score over 10 suggests significant symptoms.  A worsening CAT score could be explained by an exacerbation, poor medication adherence, poor inhaler technique, or progression of COPD or comorbid conditions.  CAT MCID is 2 points  mMRC: mMRC (Modified Medical Research Council) Dyspnea Scale is used to assess the degree of baseline functional disability in patients of respiratory disease due to dyspnea. No minimal important difference is established. A decrease in score of 1 point or greater is considered a positive change.   Pulmonary Function Assessment:  Pulmonary Function Assessment - 02/02/24 1643       Initial Spirometry Results   FVC% 34 %    FEV1% 58 %    FEV1/FVC Ratio 35    Comments test 12/09/19      Post Bronchodilator Spirometry Results   FVC% 33 %    FEV1% 59 %    FEV1/FVC Ratio 37    Comments test 12/09/19      Breath   Shortness of Breath Yes;Fear of Shortness of Breath;Limiting activity;Panic with Shortness of Breath          Exercise Target Goals: Exercise  Program Goal: Individual exercise prescription set using results from initial 6 min walk test and  THRR while considering  patient's activity barriers and safety.   Exercise Prescription Goal: Initial exercise prescription builds to 30-45 minutes a day of aerobic activity, 2-3 days per week.  Home exercise guidelines will be given to patient during program as part of exercise prescription that the participant will acknowledge.  Activity Barriers & Risk Stratification:  Activity Barriers & Cardiac Risk Stratification - 01/30/24 0924       Activity Barriers & Cardiac Risk Stratification   Activity Barriers Deconditioning;Muscular Weakness;Shortness of Breath          6 Minute Walk:  6 Minute Walk     Row Name 02/02/24 1630         6 Minute Walk   Phase Initial     Distance 300 feet     Walk Time 3.33 minutes     # of Rest Breaks 5  21 sec, 36 sec, 20 sec, 53 sec, 30 sec (all standing rest)     MPH 1.02     METS 2.08     RPE 15     Perceived Dyspnea  3     VO2 Peak 7.3     Symptoms Yes (comment)     Comments SOB, chest tightness 6/10 hard to breathe     Resting HR 98 bpm     Resting BP 126/74     Resting Oxygen  Saturation  98 %     Exercise Oxygen  Saturation  during 6 min walk 93 %     Max Ex. HR 125 bpm     Max Ex. BP 142/84     2 Minute Post BP 132/64       Interval HR   1 Minute HR 125     2 Minute HR 117     3 Minute HR 109     4 Minute HR 114     5 Minute HR 110     6 Minute HR 112     2 Minute Post HR 105     Interval Heart Rate? Yes       Interval Oxygen    Interval Oxygen ? Yes     Baseline Oxygen  Saturation % 98 %  3L NCC     1 Minute Oxygen  Saturation % 95 %     1 Minute Liters of Oxygen  4 L  NCC     2 Minute Oxygen  Saturation % 97 %     2 Minute Liters of Oxygen  4 L     3 Minute Oxygen  Saturation % 95 %     3 Minute Liters of Oxygen  4 L     4 Minute Oxygen  Saturation % 97 %     4 Minute Liters of Oxygen  4 L     5 Minute Oxygen  Saturation % 98 %      5 Minute Liters of Oxygen  4 L     6 Minute Oxygen  Saturation % 93 %     6 Minute Liters of Oxygen  4 L     2 Minute Post Oxygen  Saturation % 97 %     2 Minute Post Liters of Oxygen  4 L        Oxygen  Initial Assessment:  Oxygen  Initial Assessment - 01/30/24 0921       Home Oxygen    Home Oxygen  Device Home Concentrator;E-Tanks;Portable Concentrator    Sleep Oxygen  Prescription Continuous   ventilator machine for 5 hrs a day   Liters per minute 3    Home Exercise Oxygen  Prescription Continuous  Liters per minute 3   4 liters for shower and walking outside (up hill)   Home Resting Oxygen  Prescription Continuous    Liters per minute 3    Compliance with Home Oxygen  Use Yes   usually compliant     Initial 6 min Walk   Oxygen  Used E-Tanks;Continuous    Liters per minute 4      Program Oxygen  Prescription   Program Oxygen  Prescription E-Tanks;Continuous    Liters per minute 4      Intervention   Short Term Goals To learn and exhibit compliance with exercise, home and travel O2 prescription;To learn and understand importance of monitoring SPO2 with pulse oximeter and demonstrate accurate use of the pulse oximeter.;To learn and understand importance of maintaining oxygen  saturations>88%;To learn and demonstrate proper pursed lip breathing techniques or other breathing techniques. ;To learn and demonstrate proper use of respiratory medications    Long  Term Goals Exhibits compliance with exercise, home  and travel O2 prescription;Maintenance of O2 saturations>88%;Compliance with respiratory medication;Verbalizes importance of monitoring SPO2 with pulse oximeter and return demonstration;Exhibits proper breathing techniques, such as pursed lip breathing or other method taught during program session;Demonstrates proper use of MDI's          Oxygen  Re-Evaluation:  Oxygen  Re-Evaluation     Row Name 03/29/24 0936 04/12/24 1152 05/19/24 1019 06/07/24 1034 07/19/24 0924     Program Oxygen   Prescription   Program Oxygen  Prescription E-Tanks;Continuous E-Tanks;Continuous E-Tanks;Continuous E-Tanks;Continuous E-Tanks;Continuous   Liters per minute 3 3 3 3 3      Home Oxygen    Home Oxygen  Device Home Concentrator;E-Tanks;Portable Concentrator Home Concentrator;E-Tanks;Portable Concentrator Home Concentrator;E-Tanks;Portable Concentrator Home Concentrator;E-Tanks;Portable Concentrator Home Concentrator;E-Tanks;Portable Concentrator   Sleep Oxygen  Prescription -- Continuous Continuous Continuous Continuous   Liters per minute 3 3 3 3 3    Home Exercise Oxygen  Prescription Continuous Continuous Continuous Continuous Continuous   Liters per minute 3 3 3 3 3    Home Resting Oxygen  Prescription Continuous Continuous Continuous Continuous Continuous   Liters per minute 3 3 3 3 3    Compliance with Home Oxygen  Use Yes Yes Yes Yes --     Goals/Expected Outcomes   Short Term Goals To learn and exhibit compliance with exercise, home and travel O2 prescription;To learn and understand importance of monitoring SPO2 with pulse oximeter and demonstrate accurate use of the pulse oximeter.;To learn and understand importance of maintaining oxygen  saturations>88%;To learn and demonstrate proper pursed lip breathing techniques or other breathing techniques. ;To learn and demonstrate proper use of respiratory medications -- To learn and exhibit compliance with exercise, home and travel O2 prescription;To learn and understand importance of monitoring SPO2 with pulse oximeter and demonstrate accurate use of the pulse oximeter.;To learn and understand importance of maintaining oxygen  saturations>88%;To learn and demonstrate proper pursed lip breathing techniques or other breathing techniques. ;To learn and demonstrate proper use of respiratory medications To learn and exhibit compliance with exercise, home and travel O2 prescription;To learn and understand importance of monitoring SPO2 with pulse oximeter and  demonstrate accurate use of the pulse oximeter.;To learn and understand importance of maintaining oxygen  saturations>88%;To learn and demonstrate proper pursed lip breathing techniques or other breathing techniques. ;To learn and demonstrate proper use of respiratory medications To learn and exhibit compliance with exercise, home and travel O2 prescription;To learn and understand importance of monitoring SPO2 with pulse oximeter and demonstrate accurate use of the pulse oximeter.;To learn and understand importance of maintaining oxygen  saturations>88%;To learn and demonstrate proper pursed lip breathing techniques or  other breathing techniques. ;To learn and demonstrate proper use of respiratory medications   Long  Term Goals Exhibits compliance with exercise, home  and travel O2 prescription;Maintenance of O2 saturations>88%;Compliance with respiratory medication;Verbalizes importance of monitoring SPO2 with pulse oximeter and return demonstration;Exhibits proper breathing techniques, such as pursed lip breathing or other method taught during program session;Demonstrates proper use of MDI's -- Exhibits compliance with exercise, home  and travel O2 prescription;Maintenance of O2 saturations>88%;Compliance with respiratory medication;Verbalizes importance of monitoring SPO2 with pulse oximeter and return demonstration;Exhibits proper breathing techniques, such as pursed lip breathing or other method taught during program session;Demonstrates proper use of MDI's Exhibits compliance with exercise, home  and travel O2 prescription;Maintenance of O2 saturations>88%;Compliance with respiratory medication;Verbalizes importance of monitoring SPO2 with pulse oximeter and return demonstration;Exhibits proper breathing techniques, such as pursed lip breathing or other method taught during program session;Demonstrates proper use of MDI's Exhibits compliance with exercise, home  and travel O2 prescription;Maintenance of O2  saturations>88%;Compliance with respiratory medication;Verbalizes importance of monitoring SPO2 with pulse oximeter and return demonstration;Exhibits proper breathing techniques, such as pursed lip breathing or other method taught during program session;Demonstrates proper use of MDI's   Comments Rick's breathing has been about the same since last goal check-in. He states with the heat it has been much more difficult and he doesn't go outside when it's hot. He is compliant with his oxygen  at 3L all the time. He takes all his respiratory medications as prescribed. He states when he gets SOB a lot of times he will just have to sit still and wait for his HR to go down. Encouraged to use the PLB to help also. Rick's breathing continues to be the same since the last goal check in. He stated that he has good days and bad days. The heat/humidity has made it difficult the past month and only goes outside if he has to. He is compliant with 3L and does his PLB when he feels SOB. Dick is compliant with his oxygen  around the clock.  He is not active at home but was encouraged to do more to help with breathing.  He does use his PLB and it is helpful.  He does not use nebulizer daily and only when he needs it.  He was encouraged to use it daily and then extra when he needs it.  His sats are doing well. Dick is doing well with his oxygen . His sats have been in the low 90s.  He using his nebulizer routinely and using his inhalers regularly.  He uses his PLB to help.  He is somewhat compliant with his CPAP but does not like it and finds that it will spook him awake sometimes. Dick is doing well in rehab. He has started to use his breathing treatments more in the morning when he has a hard time breathing and it helps.  He is compliant with using his oxygen .  He continues to use PLB routinely and sats are in low 90s upper 80s.  Minimal movement still wears him out.  He has not been doing breathing exercises or flutter valve.  He was  encourged to work on his insipratometer more.   Goals/Expected Outcomes Short: Continue to attend rehab. Long: Use PLB to help with SOB. Short: Continue to attend rehab. Long: Use PLB to help with SOB. Short: Continue to use PLB routinely Long: Use nebulizer more consistently Short: Continue to use treatments Long: Continue to work on pursed lip breathing Short: Treatments daily Long; Do  breathing exercises.      Oxygen  Discharge (Final Oxygen  Re-Evaluation):  Oxygen  Re-Evaluation - 07/19/24 0924       Program Oxygen  Prescription   Program Oxygen  Prescription E-Tanks;Continuous    Liters per minute 3      Home Oxygen    Home Oxygen  Device Home Concentrator;E-Tanks;Portable Concentrator    Sleep Oxygen  Prescription Continuous    Liters per minute 3    Home Exercise Oxygen  Prescription Continuous    Liters per minute 3    Home Resting Oxygen  Prescription Continuous    Liters per minute 3      Goals/Expected Outcomes   Short Term Goals To learn and exhibit compliance with exercise, home and travel O2 prescription;To learn and understand importance of monitoring SPO2 with pulse oximeter and demonstrate accurate use of the pulse oximeter.;To learn and understand importance of maintaining oxygen  saturations>88%;To learn and demonstrate proper pursed lip breathing techniques or other breathing techniques. ;To learn and demonstrate proper use of respiratory medications    Long  Term Goals Exhibits compliance with exercise, home  and travel O2 prescription;Maintenance of O2 saturations>88%;Compliance with respiratory medication;Verbalizes importance of monitoring SPO2 with pulse oximeter and return demonstration;Exhibits proper breathing techniques, such as pursed lip breathing or other method taught during program session;Demonstrates proper use of MDI's    Comments Dick is doing well in rehab. He has started to use his breathing treatments more in the morning when he has a hard time breathing and it  helps.  He is compliant with using his oxygen .  He continues to use PLB routinely and sats are in low 90s upper 80s.  Minimal movement still wears him out.  He has not been doing breathing exercises or flutter valve.  He was encourged to work on his insipratometer more.    Goals/Expected Outcomes Short: Treatments daily Long; Do breathing exercises.          Initial Exercise Prescription:  Initial Exercise Prescription - 02/02/24 1600       Date of Initial Exercise RX and Referring Provider   Date 02/02/24    Referring Provider [name] MD      Oxygen    Oxygen  Continuous    Liters 4    Maintain Oxygen  Saturation 88% or higher      NuStep   Level 1    SPM 60    Minutes 30    METs 1.2      Track   Laps 2    Minutes 5    METs 1      Prescription Details   Frequency (times per week) 2    Duration Progress to 30 minutes of continuous aerobic without signs/symptoms of physical distress      Intensity   THRR 40-80% of Max Heartrate 120-142    Ratings of Perceived Exertion 11-13    Perceived Dyspnea 0-4      Progression   Progression Continue to progress workloads to maintain intensity without signs/symptoms of physical distress.      Resistance Training   Training Prescription Yes    Weight 3 lb    Reps 10-15          Perform Capillary Blood Glucose checks as needed.  Exercise Prescription Changes:   Exercise Prescription Changes     Row Name 02/02/24 1600 02/04/24 1300 02/18/24 1300 03/01/24 1500 03/29/24 1500     Response to Exercise   Blood Pressure (Admit) 126/74 128/58 110/60 128/74 126/64   Blood Pressure (Exercise) 142/84 134/70 120/60  130/60 --   Blood Pressure (Exit) 132/72 120/62 112/66 134/60 108/64   Heart Rate (Admit) 98 bpm 106 bpm 110 bpm 120 bpm 121 bpm   Heart Rate (Exercise) 125 bpm 106 bpm 116 bpm 124 bpm 109 bpm   Heart Rate (Exit) 104 bpm 100 bpm 116 bpm 120 bpm 111 bpm   Oxygen  Saturation (Admit) 98 % 94 % 91 % 90 % 91 %   Oxygen   Saturation (Exercise) 93 % 100 % 96 % 91 % 93 %   Oxygen  Saturation (Exit) 97 % 98 % 94 % 93 % 95 %   Rating of Perceived Exertion (Exercise) 15 15 14 15 13    Perceived Dyspnea (Exercise) 3 2 3 3 3    Symptoms standing rest breaks, SOB, chest tightness 6/10 (normal) -- -- -- --   Comments walk test results -- -- -- --   Duration -- Continue with 30 min of aerobic exercise without signs/symptoms of physical distress. Continue with 30 min of aerobic exercise without signs/symptoms of physical distress. Continue with 30 min of aerobic exercise without signs/symptoms of physical distress. Continue with 30 min of aerobic exercise without signs/symptoms of physical distress.   Intensity -- THRR unchanged THRR unchanged THRR unchanged THRR unchanged     Progression   Progression -- Continue to progress workloads to maintain intensity without signs/symptoms of physical distress. Continue to progress workloads to maintain intensity without signs/symptoms of physical distress. Continue to progress workloads to maintain intensity without signs/symptoms of physical distress. Continue to progress workloads to maintain intensity without signs/symptoms of physical distress.     Resistance Training   Training Prescription -- Yes Yes Yes Yes   Weight -- 3 3 3 3    Reps -- 10-15 10-15 10-15 10-15     Oxygen    Oxygen  -- Continuous Continuous Continuous Continuous   Liters -- 4 4 3 4      NuStep   Level -- 1 1 -- 2   SPM -- 36 45 -- 43   Minutes -- 30 30 -- 30   METs -- 1.3 1.5 -- 1.5     Oxygen    Maintain Oxygen  Saturation -- 88% or higher 88% or higher -- 88% or higher    Row Name 04/12/24 1300 05/07/24 0800 06/30/24 1300 07/21/24 1200       Response to Exercise   Blood Pressure (Admit) 130/68 140/60 138/70 132/64    Blood Pressure (Exit) 136/62 119/76 118/70 110/66    Heart Rate (Admit) 115 bpm 113 bpm 114 bpm 108 bpm    Heart Rate (Exercise) 121 bpm 116 bpm 120 bpm 120 bpm    Heart Rate (Exit) 109  bpm 109 bpm 72 bpm 110 bpm    Oxygen  Saturation (Admit) 90 % 93 % 88 % 91 %    Oxygen  Saturation (Exercise) 90 % 96 % 93 % 92 %    Oxygen  Saturation (Exit) 92 % 97 % 93 % 94 %    Rating of Perceived Exertion (Exercise) 14 15 15 14     Perceived Dyspnea (Exercise) 2 2 3 3     Duration Continue with 30 min of aerobic exercise without signs/symptoms of physical distress. Continue with 30 min of aerobic exercise without signs/symptoms of physical distress. Continue with 30 min of aerobic exercise without signs/symptoms of physical distress. Continue with 30 min of aerobic exercise without signs/symptoms of physical distress.    Intensity THRR unchanged THRR unchanged THRR unchanged THRR unchanged      Progression  Progression Continue to progress workloads to maintain intensity without signs/symptoms of physical distress. Continue to progress workloads to maintain intensity without signs/symptoms of physical distress. Continue to progress workloads to maintain intensity without signs/symptoms of physical distress. Continue to progress workloads to maintain intensity without signs/symptoms of physical distress.      Resistance Training   Training Prescription Yes Yes Yes Yes    Weight 3 3 3 3     Reps 10-15 10-15 10-15 10-15      Oxygen    Oxygen  Continuous Continuous Continuous Continuous    Liters 3 3 3 3       NuStep   Level 2 2 2 2     SPM 52 50 59 49    Minutes 30 30 30 30     METs 1.6 1.7 1.7 1.6      Oxygen    Maintain Oxygen  Saturation 88% or higher 88% or higher 88% or higher 88% or higher       Exercise Comments:   Exercise Goals and Review:   Exercise Goals     Row Name 02/02/24 1636             Exercise Goals   Increase Physical Activity Yes       Intervention Provide advice, education, support and counseling about physical activity/exercise needs.;Develop an individualized exercise prescription for aerobic and resistive training based on initial evaluation findings,  risk stratification, comorbidities and participant's personal goals.       Expected Outcomes Short Term: Attend rehab on a regular basis to increase amount of physical activity.;Long Term: Add in home exercise to make exercise part of routine and to increase amount of physical activity.;Long Term: Exercising regularly at least 3-5 days a week.       Increase Strength and Stamina Yes       Intervention Provide advice, education, support and counseling about physical activity/exercise needs.;Develop an individualized exercise prescription for aerobic and resistive training based on initial evaluation findings, risk stratification, comorbidities and participant's personal goals.       Expected Outcomes Short Term: Increase workloads from initial exercise prescription for resistance, speed, and METs.;Short Term: Perform resistance training exercises routinely during rehab and add in resistance training at home;Long Term: Improve cardiorespiratory fitness, muscular endurance and strength as measured by increased METs and functional capacity ( )       Able to understand and use rate of perceived exertion (RPE) scale Yes       Intervention Provide education and explanation on how to use RPE scale       Expected Outcomes Long Term:  Able to use RPE to guide intensity level when exercising independently;Short Term: Able to use RPE daily in rehab to express subjective intensity level       Able to understand and use Dyspnea scale Yes       Intervention Provide education and explanation on how to use Dyspnea scale       Expected Outcomes Short Term: Able to use Dyspnea scale daily in rehab to express subjective sense of shortness of breath during exertion;Long Term: Able to use Dyspnea scale to guide intensity level when exercising independently       Knowledge and understanding of Target Heart Rate Range (THRR) Yes       Intervention Provide education and explanation of THRR including how the numbers were  predicted and where they are located for reference       Expected Outcomes Short Term: Able to state/look up THRR;Short Term: Able to  use daily as guideline for intensity in rehab;Long Term: Able to use THRR to govern intensity when exercising independently       Able to check pulse independently Yes       Intervention Provide education and demonstration on how to check pulse in carotid and radial arteries.;Review the importance of being able to check your own pulse for safety during independent exercise       Expected Outcomes Short Term: Able to explain why pulse checking is important during independent exercise;Long Term: Able to check pulse independently and accurately       Understanding of Exercise Prescription Yes       Intervention Provide education, explanation, and written materials on patient's individual exercise prescription       Expected Outcomes Short Term: Able to explain program exercise prescription;Long Term: Able to explain home exercise prescription to exercise independently          Exercise Goals Re-Evaluation :  Exercise Goals Re-Evaluation     Row Name 03/03/24 1151 03/29/24 0935 04/12/24 1142 05/19/24 1007 06/07/24 0942     Exercise Goal Re-Evaluation   Exercise Goals Review Increase Physical Activity;Increase Strength and Stamina;Understanding of Exercise Prescription Increase Strength and Stamina;Increase Physical Activity;Able to understand and use rate of perceived exertion (RPE) scale;Able to understand and use Dyspnea scale;Understanding of Exercise Prescription Increase Physical Activity;Increase Strength and Stamina;Understanding of Exercise Prescription Increase Physical Activity;Increase Strength and Stamina;Understanding of Exercise Prescription Increase Physical Activity;Increase Strength and Stamina;Understanding of Exercise Prescription   Comments Dick is doing well in rehab. He has not increased his level on the nustep but his SPM are increasing slightly.  He is exercsing at a RPE of 13. He stated that he does feel a difference in his energy levels after exercise. We talking about energy saviing techniques when doing activities such as grocery shopping. Dick is doing well in rehab. He states he does not do any exercise at home that this is his only exercise regimen. He is slowly increasing his SPM on the Nustep. He does have to take breaks often due to SOB. Dick in doing well in rehab. He is deconditioned but has been improving in rehab. He continues to work on his SPM . He gets very winded during exercise and focuses on hos PLB. Will continue to montior and progress as able Dick has been doing well in rehab. He is still no doing much at home and was encouraged to move more at home especially on days he feels better.  He is limited by his breathing and fatigue.  We talked about not sitting as much as it does nothing to build his strength and stamina.  He was open to trying to move more. Dick is back today after colonscopy and CT scans last week.  He was encouraged to continue to exercise to [facility] his stamina.  He is not doing much on his off days currently as he is not breathing as well.   Expected Outcomes Short: practice energy techniques   long term: continue to attend rehab. Short: Continue to attend rehab to increase strength and stamina. Long: Incorporate exercise at home. Short: Continue to attend rehab to increase strength and stamina. Long: Incorporate exercise at home. Short; Move more at home Long; Continue to improve strength Short: Move more on good days Long: Continue to exercise    Row Name 07/19/24 0940             Exercise Goal Re-Evaluation   Exercise Goals Review  Increase Physical Activity;Increase Strength and Stamina;Understanding of Exercise Prescription       Comments Dick is still not doing much as home.  We talked about prepping for .  We also discussed thinking about maintenance for after graduation in order to keep  exercising.       Expected Outcomes Short: Start to move more at home Long: Think about maintenance for after graduation          Discharge Exercise Prescription (Final Exercise Prescription Changes):  Exercise Prescription Changes - 07/21/24 1200       Response to Exercise   Blood Pressure (Admit) 132/64    Blood Pressure (Exit) 110/66    Heart Rate (Admit) 108 bpm    Heart Rate (Exercise) 120 bpm    Heart Rate (Exit) 110 bpm    Oxygen  Saturation (Admit) 91 %    Oxygen  Saturation (Exercise) 92 %    Oxygen  Saturation (Exit) 94 %    Rating of Perceived Exertion (Exercise) 14    Perceived Dyspnea (Exercise) 3    Duration Continue with 30 min of aerobic exercise without signs/symptoms of physical distress.    Intensity THRR unchanged      Progression   Progression Continue to progress workloads to maintain intensity without signs/symptoms of physical distress.      Resistance Training   Training Prescription Yes    Weight 3    Reps 10-15      Oxygen    Oxygen  Continuous    Liters 3      NuStep   Level 2    SPM 49    Minutes 30    METs 1.6      Oxygen    Maintain Oxygen  Saturation 88% or higher          Nutrition:  Target Goals: Understanding of nutrition guidelines, daily intake of sodium 1500mg , cholesterol 200mg , calories 30% from fat and 7% or less from saturated fats, daily to have 5 or more servings of fruits and vegetables.  Biometrics:  Pre Biometrics - 02/02/24 1637       Pre Biometrics   Height 5' 9 (1.753 m)    Weight 149 lb 9.6 oz (67.9 kg)    Waist Circumference 34 inches    Hip Circumference 37.5 inches    Waist to Hip Ratio 0.91 %    BMI (Calculated) 22.08    Grip Strength 37.3 kg    Single Leg Stand 20.9 seconds           Nutrition Therapy Plan and Nutrition Goals:  Nutrition Therapy & Goals - 01/30/24 1007       Intervention Plan   Intervention Prescribe, educate and counsel regarding individualized specific dietary  modifications aiming towards targeted core components such as weight, hypertension, lipid management, diabetes, heart failure and other comorbidities.;Nutrition handout(s) given to patient.    Expected Outcomes Short Term Goal: Understand basic principles of dietary content, such as calories, fat, sodium, cholesterol and nutrients.;Long Term Goal: Adherence to prescribed nutrition plan.          Nutrition Assessments:  MEDIFICTS Score Key: >=70 Need to make dietary changes  40-70 Heart Healthy Diet <= 40 Therapeutic Level Cholesterol Diet  Flowsheet Row PULMONARY REHAB COPD ORIENTATION from 02/02/2024 in [facility]  Picture Your Plate Total Score on Admission 50   Picture Your Plate Scores: <59 Unhealthy dietary pattern with much room for improvement. 41-50 Dietary pattern unlikely to meet recommendations for good health and room for improvement. 51-60  More healthful dietary pattern, with some room for improvement.  >60 Healthy dietary pattern, although there may be some specific behaviors that could be improved.    Nutrition Goals Re-Evaluation:  Nutrition Goals Re-Evaluation     Row Name 03/03/24 1321 03/29/24 0932 04/12/24 1147 05/19/24 1011 06/07/24 0957     Goals   Nutrition Goal Healthy eating Healthy eating. Healthy eating. Short: increase water intake. Long: try to choose healthy options for meals. Short; Continue to try to drink more water Long: Continue to focus on eating around the clock   Comment Dick is doing well in rehab. He stated that he is eating wha he likes to eat and really only eats about two meals a day. He does not drink a lot of water and we talked about increasing his water intake. He does drink diet sodas throughtout the day. Dick states he is trying to eat healthy for the most part, he doesn't drink a lot of water, mostly tea. States he feels he is staying hydrated enough during the heat. Encouraged to drink more water. Dick is doing  well in rehab. He states that he is trying to eat healthy and pick healthier options for meals. He continues to not drink much water but does drink sweet tea throughout the day. He has cut back on sodas. He was encourged to drink more water throughtout the day. Dick is still not drinking enough water.  We talked about how more water will help with his fluid balance and breathing some. He is also trying to add in some more protein. He eats when he is hungry but still not eating a lot. Dick trying to eat some. He was out with colonoscopy.  He is still drinking ensure every day and working on trying to gain weight.  He is now being worked up for colon cancer and was encouraged to make sure he continues to eat.   Expected Outcome Short: increase water intake long term: try to choose healthy options for meals Short: increase water intake. Long: try to choose healthy options for meals. Short: increase water intake. Long: try to choose healthy options for meals. Short; Continue to try to drink more water Long: Continue to focus on eating around the clock Short: Continue to drink Ensure for nutrition Long: Continue to drink more    Row Name 07/19/24 0932             Goals   Nutrition Goal Short: Continue to drink Ensure for nutrition Long: Continue to drink more       Comment Dick is still not hungry.  He makes himself eat.  He is drinking his ensure in the morning with his meds.  He does have PB sandwich each day.  They went out for Mexican last night.  We talked about making sure that he gets enough protein to avoid caexia effects.       Expected Outcome Short; Increase protein Long: Continue to eat          Nutrition Goals Discharge (Final Nutrition Goals Re-Evaluation):  Nutrition Goals Re-Evaluation - 07/19/24 0932       Goals   Nutrition Goal Short: Continue to drink Ensure for nutrition Long: Continue to drink more    Comment Dick is still not hungry.  He makes himself eat.  He is drinking his  ensure in the morning with his meds.  He does have PB sandwich each day.  They went out for Mexican last night.  We  talked about making sure that he gets enough protein to avoid caexia effects.    Expected Outcome Short; Increase protein Long: Continue to eat          Psychosocial: Target Goals: Acknowledge presence or absence of significant depression and/or stress, maximize coping skills, provide positive support system. Participant is able to verbalize types and ability to use techniques and skills needed for reducing stress and depression.  Initial Review & Psychosocial Screening:  Initial Psych Review & Screening - 01/30/24 0925       Initial Review   Current issues with Current Sleep Concerns;Current Stress Concerns    Source of Stress Concerns Chronic Illness;Unable to participate in former interests or hobbies;Unable to perform yard/household activities    Comments chronic bad sleeper, wife wants him to use his ventilator at home regularly      Family Dynamics   Good Support System? Yes   wife, daughter     Barriers   Psychosocial barriers to participate in program The patient should benefit from training in stress management and relaxation.;Psychosocial barriers identified (see note)      Screening Interventions   Interventions Encouraged to exercise;To provide support and resources with identified psychosocial needs;Provide feedback about the scores to participant    Expected Outcomes Short Term goal: Utilizing psychosocial counselor, staff and physician to assist with identification of specific Stressors or current issues interfering with healing process. Setting desired goal for each stressor or current issue identified.;Long Term Goal: Stressors or current issues are controlled or eliminated.;Short Term goal: Identification and review with participant of any Quality of Life or Depression concerns found by scoring the questionnaire.;Long Term goal: The participant improves  quality of Life and PHQ9 Scores as seen by post scores and/or verbalization of changes          Quality of Life Scores:  Scores of 19 and below usually indicate a poorer quality of life in these areas.  A difference of  2-3 points is a clinically meaningful difference.  A difference of 2-3 points in the total score of the Quality of Life Index has been associated with significant improvement in overall quality of life, self-image, physical symptoms, and general health in studies assessing change in quality of life.  PHQ-9: Review Flowsheet  More data exists      06/22/2024 01/30/2024 09/10/2023 04/16/2023 10/14/2022  Depression screen PHQ 2/9  Decreased Interest 0 0 0 0 0  Down, Depressed, Hopeless 0 0 0 0 0  PHQ - 2 Score 0 0 0 0 0  Altered sleeping - 0 0 - -  Tired, decreased energy - 2 0 - -  Change in appetite - 1 0 - -  Feeling bad or failure about yourself  - 0 0 - -  Trouble concentrating - 0 0 - -  Moving slowly or fidgety/restless - 0 0 - -  Suicidal thoughts - 0 0 - -  PHQ-9 Score - 3 0 - -  Difficult doing work/chores - Not difficult at all Not difficult at all - -   Interpretation of Total Score  Total Score Depression Severity:  1-4 = Minimal depression, 5-9 = Mild depression, 10-14 = Moderate depression, 15-19 = Moderately severe depression, 20-27 = Severe depression   Psychosocial Evaluation and Intervention:  Psychosocial Evaluation - 01/30/24 1002       Psychosocial Evaluation & Interventions   Interventions Encouraged to exercise with the program and follow exercise prescription    Comments Dick is coming into pulmonary  rehab for COPD.  He has a long history and has previously been worked up for transplant, but was disqualified after finding an aneursym.  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  He lives with his wife and has his daughter and her son to check on them frequently.  They have no pets or environmental concerns at home. He does use a scooter  to get around due to his SOB.  His wife will help him in the shower, but otherwise he is independently with ADLs.  He is 3L oxygen  [facility] 24/7. He will use 4L in shower and walking outside.  He is not able to go and do as he pleases due to his breathing.  He wants to be able to do more and not get so SOB.  They came to look at program prior to signing up and were pleased.  He did rehab before at [facility] prior to transplant work up. They have no barriers to attending other than $15 copay. We talked about how it can be set up as payment plan with hospital as well.    Expected Outcomes Short: Attend rehab regularly Long: Improve stamina and breathing    Continue Psychosocial Services  Follow up required by staff          Psychosocial Re-Evaluation:  Psychosocial Re-Evaluation     Row Name 03/03/24 1258 03/29/24 0931 04/12/24 1144 05/19/24 1009 06/07/24 0952     Psychosocial Re-Evaluation   Current issues with None Identified None Identified Current Stress Concerns;Current Sleep Concerns Current Stress Concerns;Current Sleep Concerns Current Stress Concerns;Current Sleep Concerns   Comments Dick is doing well in rehab. He stated that he does not have very many issues with his sleep. He does get up during the night to use the restroom but is able to get back to sleep. He also stated that he does not have much stress in his life. He has his everyday worrying about his breathing but other then that he isnt very stressed. Dick is doing well in rehab. He identifies no stressors in his life. He states he is sleeping ok, he is a very light sleeper so he will wake up easily during the night. Dick is doing well in rehab. He stated that he get 4 hours of sleep and does okay. He is not napping during the day. He recently had a PET scan done of his intestines and said that they found something but was unsure. He is not really worried or stressed Dick is doing well in rehab.  He still does not sleep well and usually up at  3am each day. He was encouraged to try to nap in his recliner.  He notes that he gets clogged when sleeping on side.  He is worried about his colonoscopy next week to get better look at mass in intestines.  He is really worried about whether or not he will tolerate procedure and wake up after.  He was encouraged to let us  know how it goes and we talked about it being better to be aware of what is going on for him. Dick was just diagnosed with colon cancer and is awaiting to meet with oncology and surgeon.  He is over whelmed with the news and trying to get all the information he can.  We talked through some of the alternatives and he was encouraged to meet with doctors to discuss prognosis and options.  His cardiologist was encouraging as well.  He is still not  sleeping well, and the new news is also weighing on him.  He is meeting with his pulmonologist this week.  He is praying for guideance   Expected Outcomes Short: continue to have outlets for stress   long : continue to exericse for ovr all stress relef Short: Continue to attend rehab. Long:Continue to maintain a stress free lifestyle. Short: Continue to attend rehab. Long:Continue to maintain a stress free lifestyle. Short; Get through colonoscopy Long: Continue to work on sleep Short: Get information to make decision Long: Continue to cope with new decisions   Interventions Encouraged to attend Pulmonary Rehabilitation for the exercise Encouraged to attend Pulmonary Rehabilitation for the exercise Encouraged to attend Pulmonary Rehabilitation for the exercise Encouraged to attend Pulmonary Rehabilitation for the exercise Encouraged to attend Pulmonary Rehabilitation for the exercise   Continue Psychosocial Services  Follow up required by staff Follow up required by staff Follow up required by staff Follow up required by staff Follow up required by staff    Row Name 07/19/24 [phone]             Psychosocial Re-Evaluation   Current issues with Current  Stress Concerns;Current Sleep Concerns       Comments Dick has decided to forgo treatment and live life as much as he can.  He wants to enjoy his grandkids and make the most out of things that he is able.  He is at peace with his decision.  He talked to his kids and they are worried about him, but are coming to terms with his decision.  He has talked it over with his doctors too.  He is still not sleeping the best, but will sometimes lays awake.       Expected Outcomes Short; Continue to make peace with decision  Long; Conitnue to live as much as possible.       Interventions Encouraged to attend Pulmonary Rehabilitation for the exercise;Stress management education       Continue Psychosocial Services  Follow up required by staff          Psychosocial Discharge (Final Psychosocial Re-Evaluation):  Psychosocial Re-Evaluation - 07/19/24 0928       Psychosocial Re-Evaluation   Current issues with Current Stress Concerns;Current Sleep Concerns    Comments Dick has decided to forgo treatment and live life as much as he can.  He wants to enjoy his grandkids and make the most out of things that he is able.  He is at peace with his decision.  He talked to his kids and they are worried about him, but are coming to terms with his decision.  He has talked it over with his doctors too.  He is still not sleeping the best, but will sometimes lays awake.    Expected Outcomes Short; Continue to make peace with decision  Long; Conitnue to live as much as possible.    Interventions Encouraged to attend Pulmonary Rehabilitation for the exercise;Stress management education    Continue Psychosocial Services  Follow up required by staff          Education: Education Goals: Education classes will be provided on a weekly basis, covering required topics. Participant will state understanding/return demonstration of topics presented.  Learning Barriers/Preferences:  Learning Barriers/Preferences - 01/30/24 0924        Learning Barriers/Preferences   Learning Barriers Sight   glasses   Learning Preferences Skilled Demonstration          Education Topics: Know Your Numbers Group instruction  that is supported by a PowerPoint presentation. Instructor discusses importance of knowing and understanding resting, exercise, and post-exercise oxygen  saturation, heart rate, and blood pressure. Oxygen  saturation, heart rate, blood pressure, rating of perceived exertion, and dyspnea are reviewed along with a normal range for these values.    Exercise for the Pulmonary Patient Group instruction that is supported by a PowerPoint presentation. Instructor discusses benefits of exercise, core components of exercise, frequency, duration, and intensity of an exercise routine, importance of utilizing pulse oximetry during exercise, safety while exercising, and options of places to exercise outside of rehab.    MET Level  Group instruction provided by PowerPoint, verbal discussion, and written material to support subject matter. Instructor reviews what METs are and how to increase METs.    Pulmonary Medications Verbally interactive group education provided by instructor with focus on inhaled medications and proper administration.   Anatomy and Physiology of the Respiratory System Group instruction provided by PowerPoint, verbal discussion, and written material to support subject matter. Instructor reviews respiratory cycle and anatomical components of the respiratory system and their functions. Instructor also reviews differences in obstructive and restrictive respiratory diseases with examples of each.    Oxygen  Safety Group instruction provided by PowerPoint, verbal discussion, and written material to support subject matter. There is an overview of "What is Oxygen " and "Why do we need it".  Instructor also reviews how to create a safe environment for oxygen  use, the importance of using oxygen  as prescribed, and  the risks of noncompliance. There is a brief discussion on traveling with oxygen  and resources the patient may utilize.   Oxygen  Use Group instruction provided by PowerPoint, verbal discussion, and written material to discuss how supplemental oxygen  is prescribed and different types of oxygen  supply systems. Resources for more information are provided.    Breathing Techniques Group instruction that is supported by demonstration and informational handouts. Instructor discusses the benefits of pursed lip and diaphragmatic breathing and detailed demonstration on how to perform both.     Risk Factor Reduction Group instruction that is supported by a PowerPoint presentation. Instructor discusses the definition of a risk factor, different risk factors for pulmonary disease, and how the heart and lungs work together.   Pulmonary Diseases Group instruction provided by PowerPoint, verbal discussion, and written material to support subject matter. Instructor gives an overview of the different type of pulmonary diseases. There is also a discussion on risk factors and symptoms as well as ways to manage the diseases.   Stress and Energy Conservation Group instruction provided by PowerPoint, verbal discussion, and written material to support subject matter. Instructor gives an overview of stress and the impact it can have on the body. Instructor also reviews ways to reduce stress. There is also a discussion on energy conservation and ways to conserve energy throughout the day.   Warning Signs and Symptoms Group instruction provided by PowerPoint, verbal discussion, and written material to support subject matter. Instructor reviews warning signs and symptoms of stroke, heart attack, cold and flu. Instructor also reviews ways to prevent the spread of infection.   Other Education Group or individual verbal, written, or video instructions that support the educational goals of the pulmonary rehab program.     Knowledge Questionnaire Score:  Knowledge Questionnaire Score - 02/02/24 1638       Knowledge Questionnaire Score   Pre Score 15/18          Core Components/Risk Factors/Patient Goals at Admission:  Personal Goals and Risk Factors at  Admission - 02/02/24 1639       Core Components/Risk Factors/Patient Goals on Admission    Weight Management Yes;Weight Maintenance    Intervention Weight Management: Develop a combined nutrition and exercise program designed to reach desired caloric intake, while maintaining appropriate intake of nutrient and fiber, sodium and fats, and appropriate energy expenditure required for the weight goal.;Weight Management: Provide education and appropriate resources to help participant work on and attain dietary goals.    Admit Weight 149 lb 9.6 oz (67.9 kg)    Goal Weight: Short Term 150 lb (68 kg)    Goal Weight: Long Term 150 lb (68 kg)    Expected Outcomes Short Term: Continue to assess and modify interventions until short term weight is achieved;Long Term: Adherence to nutrition and physical activity/exercise program aimed toward attainment of established weight goal;Weight Maintenance: Understanding of the daily nutrition guidelines, which includes 25-35% calories from fat, 7% or less cal from saturated fats, less than 200mg  cholesterol, less than 1.5gm of sodium, & 5 or more servings of fruits and vegetables daily    Improve shortness of breath with ADL's Yes    Intervention Provide education, individualized exercise plan and daily activity instruction to help decrease symptoms of SOB with activities of daily living.    Expected Outcomes Short Term: Improve cardiorespiratory fitness to achieve a reduction of symptoms when performing ADLs;Long Term: Be able to perform more ADLs without symptoms or delay the onset of symptoms    Increase knowledge of respiratory medications and ability to use respiratory devices properly  Yes    Intervention Provide education  and demonstration as needed of appropriate use of medications, inhalers, and oxygen  therapy.    Expected Outcomes Short Term: Achieves understanding of medications use. Understands that oxygen  is a medication prescribed by physician. Demonstrates appropriate use of inhaler and oxygen  therapy.;Long Term: Maintain appropriate use of medications, inhalers, and oxygen  therapy.    Hypertension Yes    Intervention Provide education on lifestyle modifcations including regular physical activity/exercise, weight management, moderate sodium restriction and increased consumption of fresh fruit, vegetables, and low fat dairy, alcohol moderation, and smoking cessation.;Monitor prescription use compliance.    Expected Outcomes Short Term: Continued assessment and intervention until BP is < 140/46mm HG in hypertensive participants. < 130/45mm HG in hypertensive participants with diabetes, heart failure or chronic kidney disease.;Long Term: Maintenance of blood pressure at goal levels.    Lipids Yes    Intervention Provide education and support for participant on nutrition & aerobic/resistive exercise along with prescribed medications to achieve LDL 70mg , HDL >40mg .    Expected Outcomes Short Term: Participant states understanding of desired cholesterol values and is compliant with medications prescribed. Participant is following exercise prescription and nutrition guidelines.;Long Term: Cholesterol controlled with medications as prescribed, with individualized exercise RX and with personalized nutrition plan. Value goals: LDL < 70mg , HDL > 40 mg.          Core Components/Risk Factors/Patient Goals Review:   Goals and Risk Factor Review     Row Name 03/03/24 1324 03/29/24 0933 04/12/24 1150 05/19/24 1013 06/07/24 1027     Core Components/Risk Factors/Patient Goals Review   Personal Goals Review Weight Management/Obesity;Stress;Improve shortness of breath with ADL's Weight Management/Obesity;Improve shortness of  breath with ADL's;Develop more efficient breathing techniques such as purse lipped breathing and diaphragmatic breathing and practicing self-pacing with activity. Weight Management/Obesity;Improve shortness of breath with ADL's;Increase knowledge of respiratory medications and ability to use respiratory devices properly.;Develop more efficient breathing techniques such  as purse lipped breathing and diaphragmatic breathing and practicing self-pacing with activity. Weight Management/Obesity;Improve shortness of breath with ADL's;Increase knowledge of respiratory medications and ability to use respiratory devices properly.;Develop more efficient breathing techniques such as purse lipped breathing and diaphragmatic breathing and practicing self-pacing with activity. Weight Management/Obesity;Improve shortness of breath with ADL's;Increase knowledge of respiratory medications and ability to use respiratory devices properly.;Develop more efficient breathing techniques such as purse lipped breathing and diaphragmatic breathing and practicing self-pacing with activity.   Review Dick is doing well in rehab. He Dick is doing well in rehab. He takes all his medications as prescribed. He checks his O2 at home and it often runs low like 85% when he has walked just a short distance. He doesn't check his BP at home, which is really hasn't been a problem for him here at rehab. Dick is doing well in rehab. He is taking all medications as prescribed and continues to check his O2 stats outside of class. He is focusing on PLB when he gets SOB and when his oxygen  drops. He does not check his BP at home, his BP has not been an issue during rehab but it is encourged to check at home to know paitents resting numbers Dick is doing well in rehab.  He is gaining some weight.  He has been working on his breathing.  He still gets SOB easily and it is his biggest limitation.  He is working on PLB and finds it helpful.  He does note when he  breathes through his mouth is upsets his stomach as he swallows air more then.  He was encouraged to use his nebulizer more regularly and extra when he needs it versus just when he needs it. Dick has just been diagnosed colon cancer.  His weight is down from not eating after his news.  He still has good days and bad days with breathing.  He does not like his CPAP machine and sleeps in chair with it.  He is good about using his breathing treatments.   Expected Outcomes -- Short: Continue to check O2 saturation at home. Long: Report abnormalities to all healthcare professionals. Short: Continue to check O2 saturation at home. Long: Report abnormalities to all healthcare professionals. Short: Use nebulizer daily Long: Continue to work on using PLB Short; Find out more about his colon cancer Long: Continue use breathing treatments.    Row Name 07/19/24 0935             Core Components/Risk Factors/Patient Goals Review   Personal Goals Review Weight Management/Obesity;Improve shortness of breath with ADL's;Increase knowledge of respiratory medications and ability to use respiratory devices properly.;Develop more efficient breathing techniques such as purse lipped breathing and diaphragmatic breathing and practicing self-pacing with activity.       Review Dick is not going to do anything about his cancer.  His weight is holding steady.  He really wants to gain weight.  He is compliant with his oxygen  and working on doing more. His breathing is his biggest limitation.  He is good about using his PLB and doing treatments now.  We talked about making sure he eats protein daily.       Expected Outcomes Short: Add in more breathing exercises and protein Long; COnitnue to work on breathing          Core Components/Risk Factors/Patient Goals at Discharge (Final Review):   Goals and Risk Factor Review - 07/19/24 0935       Core Components/Risk Factors/Patient  Goals Review   Personal Goals Review Weight  Management/Obesity;Improve shortness of breath with ADL's;Increase knowledge of respiratory medications and ability to use respiratory devices properly.;Develop more efficient breathing techniques such as purse lipped breathing and diaphragmatic breathing and practicing self-pacing with activity.    Review Dick is not going to do anything about his cancer.  His weight is holding steady.  He really wants to gain weight.  He is compliant with his oxygen  and working on doing more. His breathing is his biggest limitation.  He is good about using his PLB and doing treatments now.  We talked about making sure he eats protein daily.    Expected Outcomes Short: Add in more breathing exercises and protein Long; COnitnue to work on breathing          ITP Comments:  ITP Comments     Row Name 01/30/24 1011 02/02/24 1630 02/04/24 0913 02/11/24 0828 03/10/24 0754   ITP Comments Completed virtual orientation today in person as he came to see program prior to signing up.  EP evaluation is scheduled for Monday May 12 at 1300 .  Documentation for diagnosis can be found in [facility] encounter 01/26/24. Patient attend orientation today.  Patient is attending Pulmonary Rehabilitation Program.  Documentation for diagnosis can be found in [facility] OV 01/26/24.  Reviewed medical chart, RPE/RPD, gym safety, and program guidelines.  Patient was fitted to equipment they will be using during rehab.  Patient is scheduled to start exercise on Wed 5/14 at 915.   Initial ITP created and sent for review and signature by Dr. Anton Kelp, Medical Director for Pulmonary Rehabilitation Program. First full day of exercise!  Patient was oriented to gym and equipment including functions, settings, policies, and procedures.  Patient's individual exercise prescription and treatment plan were reviewed.  All starting workloads were established based on the results of the 6 minute walk test done at initial orientation visit.  The plan for exercise progression  was also introduced and progression will be customized based on patient's performance and goals. 30 day review completed. ITP sent to Dr.Jehanzeb Memon, Medical Director of  Pulmonary Rehab. Continue with ITP unless changes are made by physician.  New to program. 30 day review completed. ITP sent to Dr.Jehanzeb Memon, Medical Director of Pulmonary Rehab. Continue with ITP unless changes are made by physician.    Row Name 04/05/24 9061 04/07/24 0830 05/05/24 1002 06/02/24 1426 06/30/24 1012   ITP Comments Called to check on patient when he did not show for rehab today.  Daril was having a bad breathing day and did not feel up to coming to rehab today.  He hopes to return on Wednesday and was encouraged to use his nebulizer an extra time today. 30 day review completed. ITP sent to Dr.Jehanzeb Memon, Medical Director of  Pulmonary Rehab. Continue with ITP unless changes are made by physician. 30 day review completed. ITP sent to Dr.Jehanzeb Memon, Medical Director of  Pulmonary Rehab. Continue with ITP unless changes are made by physician. 30 day review completed. ITP sent to Dr.Jehanzeb Memon, Medical Director of  Pulmonary Rehab. Continue with ITP unless changes are made by physician. Has been out this week with appts. 30 day review completed. ITP sent to Dr.Jehanzeb Memon, Medical Director of  Pulmonary Rehab. Continue with ITP unless changes are made by physician.    Row Name 07/28/24 0806           ITP Comments 30 day review completed. ITP sent to Dr.Jehanzeb Memon,  Medical Director of  Pulmonary Rehab. Continue with ITP unless changes are made by physician.          Comments: 30 day review

## 2024-07-28 NOTE — Progress Notes (Signed)
 Daily Session Note  Patient Details  Name: John [name] MRN: [id number] Date of Birth: 1955/10/18 Referring Provider:   Flowsheet Row PULMONARY REHAB COPD ORIENTATION from 02/02/2024 in [facility] CARDIAC REHABILITATION  Referring Provider [name] MD    Encounter Date: 07/28/2024  Check In:  Session Check In - 07/28/24 0930       Check-In   Supervising physician immediately available to respond to emergencies See telemetry face sheet for immediately available MD    Location [facility]    Staff Present Powell Benders, BS, Exercise Physiologist;Hillary Dean BSN, RN    Virtual Visit No    Medication changes reported     No    Fall or balance concerns reported    No    Tobacco Cessation No Change    Warm-up and Cool-down Performed on first and last piece of equipment    Resistance Training Performed Yes    VAD Patient? No    PAD/SET Patient? No      Pain Assessment   Currently in Pain? No/denies    Pain Score 0-No pain    Multiple Pain Sites No          Capillary Blood Glucose: No results found for this or any previous visit (from the past 24 hours).    Social History   Tobacco Use  Smoking Status Former   Current packs/day: 0.00   Average packs/day: 2.0 packs/day for 51.6 years (103.1 ttl pk-yrs)   Types: Cigarettes   Start date: 12   Quit date: 04/18/2020   Years since quitting: 4.2  Smokeless Tobacco Never  Tobacco Comments   2 plus packs per day. He has a 100 + pack - year history of tobacco abuse currently. Former 4 ppd for 25 years.    Goals Met:  Independence with exercise equipment Exercise tolerated well No report of concerns or symptoms today Strength training completed today  Goals Unmet:  Not Applicable  Comments: Pt able to follow exercise prescription today without complaint.  Will continue to monitor for progression.

## 2024-08-02 ENCOUNTER — Encounter (HOSPITAL_COMMUNITY)
Admission: RE | Admit: 2024-08-02 | Discharge: 2024-08-02 | Disposition: A | Discharge status: Home / Self Care | Source: Ambulatory Visit | Attending: Pulmonary Disease | Admitting: Pulmonary Disease

## 2024-08-02 DIAGNOSIS — J4489 Other specified chronic obstructive pulmonary disease: Secondary | ICD-10-CM

## 2024-08-02 NOTE — Progress Notes (Signed)
 Daily Session Note  Patient Details  Name: John [name] MRN: [id number] Date of Birth: [birth year] Referring Provider:   Flowsheet Row PULMONARY REHAB COPD ORIENTATION from 02/02/2024 in [facility] CARDIAC REHABILITATION  Referring Provider [name] MD    Encounter Date: 08/02/2024  Check In:  Session Check In - 08/02/24 0900       Check-In   Supervising physician immediately available to respond to emergencies See telemetry face sheet for immediately available MD    Location [facility]    Staff Present Laymon Rattler, BSN, RN, WTA-C;Heather Con, BS, Exercise Physiologist    Virtual Visit No    Medication changes reported     No    Fall or balance concerns reported    No    Tobacco Cessation No Change    Warm-up and Cool-down Performed on first and last piece of equipment    Resistance Training Performed Yes    VAD Patient? No    PAD/SET Patient? No      Pain Assessment   Currently in Pain? No/denies          Capillary Blood Glucose: No results found for this or any previous visit (from the past 24 hours).    Social History   Tobacco Use  Smoking Status Former   Current packs/day: 0.00   Average packs/day: 2.0 packs/day for 51.6 years (103.1 ttl pk-yrs)   Types: Cigarettes   Start date: 52   Quit date: 04/18/2020   Years since quitting: 4.2  Smokeless Tobacco Never  Tobacco Comments   2 plus packs per day. He has a 100 + pack - year history of tobacco abuse currently. Former 4 ppd for 25 years.    Goals Met:  Proper associated with RPD/PD & O2 Sat Independence with exercise equipment Improved SOB with ADL's Using PLB without cueing & demonstrates good technique Exercise tolerated well No report of concerns or symptoms today Strength training completed today  Goals Unmet:  Not Applicable  Comments: Pt able to follow exercise prescription today without complaint.  Will continue to monitor for progression.

## 2024-08-04 ENCOUNTER — Encounter (HOSPITAL_COMMUNITY)

## 2024-08-09 ENCOUNTER — Encounter (HOSPITAL_COMMUNITY)
Admission: RE | Admit: 2024-08-09 | Discharge: 2024-08-09 | Disposition: A | Source: Ambulatory Visit | Attending: Pulmonary Disease

## 2024-08-09 DIAGNOSIS — J4489 Other specified chronic obstructive pulmonary disease: Secondary | ICD-10-CM

## 2024-08-09 NOTE — Progress Notes (Signed)
 Daily Session Note  Patient Details  Name: John [name] MRN: [id number] Date of Birth: Jan 14, 1956 Referring Provider:   Flowsheet Row PULMONARY REHAB COPD ORIENTATION from 02/02/2024 in [facility] CARDIAC REHABILITATION  Referring Provider [name] MD    Encounter Date: 08/09/2024  Check In:  Session Check In - 08/09/24 0915       Check-In   Supervising physician immediately available to respond to emergencies See telemetry face sheet for immediately available MD    Location [facility]    Staff Present Adrien Louder, RN, BSN;Victoria Zina, RN;Brittany Jackquline, BSN, RN, WTA-C    Virtual Visit No    Medication changes reported     No    Fall or balance concerns reported    No    Warm-up and Cool-down Performed on first and last piece of equipment    Resistance Training Performed Yes    VAD Patient? No    PAD/SET Patient? No      Pain Assessment   Currently in Pain? No/denies    Pain Score 0-No pain    Multiple Pain Sites No          Capillary Blood Glucose: No results found for this or any previous visit (from the past 24 hours).    Social History   Tobacco Use  Smoking Status Former   Current packs/day: 0.00   Average packs/day: 2.0 packs/day for 51.6 years (103.1 ttl pk-yrs)   Types: Cigarettes   Start date: 60   Quit date: 04/18/2020   Years since quitting: 4.3  Smokeless Tobacco Never  Tobacco Comments   2 plus packs per day. He has a 100 + pack - year history of tobacco abuse currently. Former 4 ppd for 25 years.    Goals Met:  Proper associated with RPD/PD & O2 Sat Independence with exercise equipment Using PLB without cueing & demonstrates good technique Exercise tolerated well No report of concerns or symptoms today Strength training completed today  Goals Unmet:  Not Applicable  Comments: Pt able to follow exercise prescription today without complaint.  Will continue to monitor for progression.

## 2024-08-10 ENCOUNTER — Telehealth: Payer: Self-pay | Admitting: *Deleted

## 2024-08-10 NOTE — Telephone Encounter (Signed)
 Call [facility] spoke with John [name] Patient will need to sign consent for 2026. Called patient and informed as he has appt 12/4. Next shipment will be 09/04/24. NFN   Copied from CRM [id number]. Topic: Clinical - Prescription Issue >> Aug 09, 2024  3:58 PM John [name] wrote: Reason for CRM: Patient's spouse is calling regarding budesonide -glycopyrrolate -formoterol  (BREZTRI  AEROSPHERE) 160-9-4.8 MCG/ACT AERO inhaler.Patient's spouse states they used to be able to call the number included in the Breztri  packaging. Patient's wife states that they have always gotten to talk to a person and now all they can get is a robot. Patient's spouse is very upset and would like the clinic to get the Breztri  approved for this patient for the next year.  PAS suspect spouse means patient is enrolled in a program through the Breztri  manufacturer to receive the medication at no-cost, but cannot confirm based on patient statements. Spouse is requesting we either call or fax needed information (states we have faxed information before?) so they can be approved. Spouse also requesting a call back when it has been faxed and a call back when he has been approved.

## 2024-08-11 ENCOUNTER — Encounter (HOSPITAL_COMMUNITY)
Admission: RE | Admit: 2024-08-11 | Discharge: 2024-08-11 | Disposition: A | Discharge status: Home / Self Care | Source: Ambulatory Visit | Attending: Pulmonary Disease | Admitting: Pulmonary Disease

## 2024-08-11 VITALS — Ht 69.0 in | Wt 146.3 lb

## 2024-08-11 DIAGNOSIS — J4489 Other specified chronic obstructive pulmonary disease: Secondary | ICD-10-CM | POA: Diagnosis not present

## 2024-08-11 NOTE — Progress Notes (Signed)
 Daily Session Note  Patient Details  Name: John [name] MRN: [id number] Date of Birth: 10-Jul-1956 Referring Provider:   Flowsheet Row PULMONARY REHAB COPD ORIENTATION from 02/02/2024 in [facility] CARDIAC REHABILITATION  Referring Provider [name] MD    Encounter Date: 08/11/2024  Check In:  Session Check In - 08/11/24 0906       Check-In   Supervising physician immediately available to respond to emergencies See telemetry face sheet for immediately available MD    Location [facility]    Staff Present Laymon Rattler, BSN, RN, Rosalba Gelineau, MA, RCEP, CCRP, CCET    Virtual Visit No    Medication changes reported     No    Fall or balance concerns reported    No    Tobacco Cessation No Change    Warm-up and Cool-down Performed on first and last piece of equipment    Resistance Training Performed Yes    VAD Patient? No    PAD/SET Patient? No      Pain Assessment   Currently in Pain? No/denies          Capillary Blood Glucose: No results found for this or any previous visit (from the past 24 hours).    Social History   Tobacco Use  Smoking Status Former   Current packs/day: 0.00   Average packs/day: 2.0 packs/day for 51.6 years (103.1 ttl pk-yrs)   Types: Cigarettes   Start date: 15   Quit date: 04/18/2020   Years since quitting: 4.3  Smokeless Tobacco Never  Tobacco Comments   2 plus packs per day. He has a 100 + pack - year history of tobacco abuse currently. Former 4 ppd for 25 years.    Goals Met:  Proper associated with RPD/PD & O2 Sat Independence with exercise equipment Improved SOB with ADL's Using PLB without cueing & demonstrates good technique Exercise tolerated well No report of concerns or symptoms today Strength training completed today  Goals Unmet:  Not Applicable  Comments: Pt able to follow exercise prescription today without complaint.  Will continue to monitor for progression.

## 2024-08-11 NOTE — Patient Instructions (Signed)
 Discharge Patient Instructions  Patient Details  Name: John [name] MRN: [id number] Date of Birth: 22-Mar-1956 Referring Provider:  Tobie Suzzane POUR, MD   Number of Visits: 13  Reason for Discharge:  Patient reached a stable level of exercise. Patient independent in their exercise. Patient has met program and personal goals.  Smoking History:  Social History   Tobacco Use  Smoking Status Former   Current packs/day: 0.00   Average packs/day: 2.0 packs/day for 51.6 years (103.1 ttl pk-yrs)   Types: Cigarettes   Start date: 52   Quit date: 04/18/2020   Years since quitting: 4.3  Smokeless Tobacco Never  Tobacco Comments   2 plus packs per day. He has a 100 + pack - year history of tobacco abuse currently. Former 4 ppd for 25 years.    Diagnosis:  Chronic bronchitis with COPD (chronic obstructive pulmonary disease) (HCC)  Initial Exercise Prescription:   Discharge Exercise Prescription (Final Exercise Prescription Changes):  Exercise Prescription Changes - 07/28/24 1200       Response to Exercise   Blood Pressure (Admit) 122/60    Blood Pressure (Exit) 132/60    Heart Rate (Admit) 104 bpm    Heart Rate (Exercise) 113 bpm    Heart Rate (Exit) 116 bpm    Oxygen  Saturation (Admit) 91 %    Oxygen  Saturation (Exercise) 92 %    Oxygen  Saturation (Exit) 91 %    Rating of Perceived Exertion (Exercise) 15    Perceived Dyspnea (Exercise) 3    Duration Continue with 30 min of aerobic exercise without signs/symptoms of physical distress.    Intensity THRR unchanged      Progression   Progression Continue to progress workloads to maintain intensity without signs/symptoms of physical distress.      Resistance Training   Training Prescription Yes    Weight 3    Reps 10-15      Oxygen    Oxygen  Continuous    Liters 3      NuStep   Level 2    SPM 50    Minutes 30    METs 1.7      Oxygen    Maintain Oxygen  Saturation 88% or higher          Functional  Capacity:  6 Minute Walk     Row Name 08/11/24 1137         6 Minute Walk   Phase Discharge     Distance 400 feet     Distance % Change 33.3 %     Distance Feet Change 100 ft     Walk Time 3.25 minutes     # of Rest Breaks 3  53sec, 50 sec, stopped at 5:04     METS 2.28     RPE 17     Perceived Dyspnea  4     VO2 Peak 7.99     Symptoms Yes (comment)     Comments SOB     Resting HR 107 bpm     Resting BP 136/72     Resting Oxygen  Saturation  98 %     Exercise Oxygen  Saturation  during 6 min walk 86 %     Max Ex. HR 121 bpm     Max Ex. BP 146/82     2 Minute Post BP 122/62       Interval HR   1 Minute HR 118     2 Minute HR 116     3 Minute HR 114  4 Minute HR 120     5 Minute HR 116     6 Minute HR 121     2 Minute Post HR 114     Interval Heart Rate? Yes       Interval Oxygen    Interval Oxygen ? Yes     Baseline Oxygen  Saturation % 93 %     1 Minute Oxygen  Saturation % 98 %     1 Minute Liters of Oxygen  4 L  NCC     2 Minute Oxygen  Saturation % 89 %     2 Minute Liters of Oxygen  4 L     3 Minute Oxygen  Saturation % 90 %     3 Minute Liters of Oxygen  4 L     4 Minute Oxygen  Saturation % 90 %     4 Minute Liters of Oxygen  4 L     5 Minute Oxygen  Saturation % 88 %     5 Minute Liters of Oxygen  4 L     6 Minute Oxygen  Saturation % 86 %     6 Minute Liters of Oxygen  4 L     2 Minute Post Oxygen  Saturation % 89 %     2 Minute Post Liters of Oxygen  4 L        Nutrition & Weight - Outcomes:   Post Biometrics - 08/11/24 1140        Post  Biometrics   Height 5' 9 (1.753 m)    Weight 66.4 kg    Waist Circumference 34 inches    Hip Circumference 37.5 inches    Waist to Hip Ratio 0.91 %    BMI (Calculated) 21.59    Grip Strength 38.6 kg    Single Leg Stand 8.2 seconds

## 2024-08-12 ENCOUNTER — Telehealth (HOSPITAL_BASED_OUTPATIENT_CLINIC_OR_DEPARTMENT_OTHER): Payer: Self-pay

## 2024-08-12 MED ORDER — AZITHROMYCIN 250 MG PO TABS: ORAL_TABLET | ORAL | 0 refills | Status: DC

## 2024-08-12 NOTE — Telephone Encounter (Signed)
 Copied from CRM #[id number]. Topic: Clinical - Medication Question >> Aug 12, 2024 10:11 AM John [name] wrote: Reason for CRM: Pt would like a zpack called into the pharmacy, pt stated he is congested and would like a medication.

## 2024-08-13 DIAGNOSIS — Z515 Encounter for palliative care: Secondary | ICD-10-CM | POA: Diagnosis not present

## 2024-08-13 DIAGNOSIS — J449 Chronic obstructive pulmonary disease, unspecified: Secondary | ICD-10-CM | POA: Diagnosis not present

## 2024-08-13 DIAGNOSIS — C189 Malignant neoplasm of colon, unspecified: Secondary | ICD-10-CM | POA: Diagnosis not present

## 2024-08-16 ENCOUNTER — Encounter (HOSPITAL_COMMUNITY)
Admission: RE | Admit: 2024-08-16 | Discharge: 2024-08-16 | Disposition: A | Discharge status: Home / Self Care | Source: Ambulatory Visit | Attending: Pulmonary Disease | Admitting: Pulmonary Disease

## 2024-08-16 ENCOUNTER — Telehealth (HOSPITAL_BASED_OUTPATIENT_CLINIC_OR_DEPARTMENT_OTHER): Payer: Self-pay

## 2024-08-16 DIAGNOSIS — J4489 Other specified chronic obstructive pulmonary disease: Secondary | ICD-10-CM

## 2024-08-16 MED ORDER — PREDNISONE 10 MG PO TABS: ORAL_TABLET | ORAL | 0 refills | Status: DC

## 2024-08-16 NOTE — Progress Notes (Signed)
 Daily Session Note  Patient Details  Name: John [name] MRN: [id number] Date of Birth: 1955-10-21 Referring Provider:   Flowsheet Row PULMONARY REHAB COPD ORIENTATION from 02/02/2024 in [facility]  Referring Provider [name] MD    Encounter Date: 08/16/2024  Check In:  Session Check In - 08/16/24 0858       Check-In   Supervising physician immediately available to respond to emergencies See telemetry face sheet for immediately available MD    Location [facility]    Staff Present Laymon Rattler, BSN, RN, Rosalba Gelineau, MA, RCEP, CCRP, CCET;Heather Brewerton, [place], Exercise Physiologist    Virtual Visit No    Medication changes reported     No    Fall or balance concerns reported    No    Tobacco Cessation No Change    Warm-up and Cool-down Performed on first and last piece of equipment    Resistance Training Performed Yes    VAD Patient? No    PAD/SET Patient? No      Pain Assessment   Currently in Pain? No/denies          Capillary Blood Glucose: No results found for this or any previous visit (from the past 24 hours).    Social History   Tobacco Use  Smoking Status Former   Current packs/day: 0.00   Average packs/day: 2.0 packs/day for 51.6 years (103.1 ttl pk-yrs)   Types: Cigarettes   Start date: 54   Quit date: 04/18/2020   Years since quitting: 4.3  Smokeless Tobacco Never  Tobacco Comments   2 plus packs per day. He has a 100 + pack - year history of tobacco abuse currently. Former 4 ppd for 25 years.    Goals Met:  Proper associated with RPD/PD & O2 Sat Independence with exercise equipment Improved SOB with ADL's Using PLB without cueing & demonstrates good technique Exercise tolerated well No report of concerns or symptoms today Strength training completed today  Goals Unmet:  Not Applicable  Comments: Pt able to follow exercise prescription today without complaint.  Will continue to  monitor for progression.

## 2024-08-16 NOTE — Telephone Encounter (Signed)
 Copied from CRM [id number]. Topic: Clinical - Medication Question >> Aug 13, 2024  3:59 PM Celestine FALCON wrote: Reason for CRM: Pt stated that he would like if his provider D. Jude would send in a prescription of prednisone  to help relieve his symptoms his preferred pharmacy of [id number] - [place], [place] - [address] AT [facility] [address] [place] [address] Phone: [phone] Fax: [phone] Hours: Not open 24 hours. Pt stated he received the z-pak, but he typically gets both z-pak and prednisone .   Pt's phone number is [phone] ok to leave a vm.

## 2024-08-18 ENCOUNTER — Encounter (HOSPITAL_COMMUNITY)

## 2024-08-23 ENCOUNTER — Encounter (HOSPITAL_COMMUNITY)

## 2024-08-24 ENCOUNTER — Encounter (HOSPITAL_COMMUNITY): Payer: Self-pay | Admitting: *Deleted

## 2024-08-24 DIAGNOSIS — J4489 Other specified chronic obstructive pulmonary disease: Secondary | ICD-10-CM

## 2024-08-24 NOTE — Progress Notes (Signed)
 Pulmonary Individual Treatment Plan  Patient Details  Name: John [name] MRN: [id number] Date of Birth: May 12, 1956 Referring Provider:   Flowsheet Row PULMONARY REHAB COPD ORIENTATION from 02/02/2024 in [facility] CARDIAC REHABILITATION  Referring Provider [name] MD    Initial Encounter Date:  Flowsheet Row PULMONARY REHAB COPD ORIENTATION from 02/02/2024 in [place] [facility]IAC REH[facility]IL[facility]ATION  Date 02/02/24    Visit Diagnosis: Chronic bronchitis with COPD (chronic obstructive pulmonary disease) (HCC)  Patient's Home Medications on Admission:   Current Outpatient Medications:    albuterol  (PROAIR  HFA) 108 (90 Base) MCG/ACT inhaler, Inhale 2 puffs into the lungs every 4 (four) hours as needed for wheezing or shortness of breath. 2 puffs every 4 hours as needed only  if your can't catch your breath, Disp: 8 g, Rfl: 5   albuterol  (PROVENTIL ) (2.5 MG/3ML) 0.083% nebulizer solution, Take 3 mLs (2.5 mg total) by nebulization every 4 (four) hours as needed for wheezing or shortness of breath., Disp: 360 mL, Rfl: 5   aspirin  EC 81 MG tablet, Take 81 mg by mouth daily., Disp: , Rfl:    atorvastatin  (LIPITOR ) 40 MG tablet, Take 1 tablet (40 mg total) by mouth daily., Disp: 90 tablet, Rfl: 3   azithromycin  (ZITHROMAX ) 250 MG tablet, Take two tabs x day 1, then 1 tab daily, Disp: 6 tablet, Rfl: 0   budesonide -glycopyrrolate -formoterol  (BREZTRI  AEROSPHERE) 160-9-4.8 MCG/ACT AERO inhaler, Inhale 2 puffs into the lungs in the morning and at bedtime., Disp: 10.7 g, Rfl: 11   furosemide  (LASIX ) 40 MG tablet, Take 1 tablet (40 mg total) by mouth daily., Disp: 90 tablet, Rfl: 3   guaiFENesin  (MUCINEX ) 600 MG 12 hr tablet, Take 2 tablets (1,200 mg total) by mouth 2 (two) times daily as needed for cough or to loosen phlegm., Disp: , Rfl:    Magnesium  250 MG CAPS, Take by mouth daily., Disp: , Rfl:    Multiple Vitamin (MULTIVITAMIN WITH MINERALS) TABS tablet, Take 1 tablet by mouth daily., Disp: ,  Rfl:    nitroGLYCERIN  (NITROSTAT ) 0.4 MG SL tablet, Place 1 tablet (0.4 mg total) under the tongue every 5 (five) minutes as needed for chest pain., Disp: 25 tablet, Rfl: 6   OXYGEN , Inhale 2-3 L into the lungs., Disp: , Rfl:    predniSONE  (DELTASONE ) 10 MG tablet, Take 4 tablets daily x 5 days, Disp: 20 tablet, Rfl: 0  Past Medical History: Past Medical History:  Diagnosis Date   Abdominal aortic aneurysm (AAA)    EVAR Dr. Lanis  2023   Alcohol abuse 12/27/2010   Allergic rhinitis, cause unspecified 12/27/2010   Anal warts 04/11/2012   CAD, NATIVE VESSEL 10/04/2010   COPD (chronic obstructive pulmonary disease) (HCC)    Stage 4 uses o2 3 L continous   History of blood transfusion 03/2012   related to nose bleed   HYPERLIPIDEMIA-MIXED 10/04/2010   HYPERTENSION, BENIGN 10/04/2010   Myocardial infarction (HCC) 09/16/2010   x2   On home O2    Pneumonia    when I was a kid   Stented coronary artery    Mid LAD    TOBACCO ABUSE 10/04/2010    Tobacco Use: Social History   Tobacco Use  Smoking Status Former   Current packs/day: 0.00   Average packs/day: 2.0 packs/day for 51.6 years (103.1 ttl pk-yrs)   Types: Cigarettes   Start date: 35   Quit date: 04/18/2020   Years since quitting: 4.3  Smokeless Tobacco Never  Tobacco Comments   2  plus packs per day. He has a 100 + pack - year history of tobacco abuse currently. Former 4 ppd for 25 years.    Labs: Review Flowsheet  More data exists      Latest Ref Rng & Units 02/12/2021 03/22/2022 07/28/2022 10/14/2022 10/21/2023  Labs for ITP Cardiac and Pulmonary Rehab  Cholestrol 100 - 199 mg/dL - 833  - 840  821   LDL (calc) 0 - 99 mg/dL - 76  - 72  81   HDL-C >39 mg/dL - 77  - 76  86   Trlycerides 0 - 149 mg/dL - 70  - 55  57   PH, Arterial 7.35 - 7.45 7.283  - 7.265  - -  PCO2 arterial 32 - 48 mmHg 80.9  - 80.0  - -  Bicarbonate 20.0 - 28.0 mmol/L 31.4  - 36.7  - -  TCO2 22 - 32 mmol/L - - 39  - -  O2 Saturation % 97.2  -  97  - -    Capillary Blood Glucose: Lab Results  Component Value Date   GLUCAP 185 (H) 04/25/2020   GLUCAP 80 04/25/2020   GLUCAP 80 04/25/2020   GLUCAP 131 (H) 04/24/2020   GLUCAP 176 (H) 04/24/2020     Pulmonary Assessment Scores:  Pulmonary Assessment Scores     Row Name 08/11/24 1156         ADL UCSD   ADL Phase Exit     SOB Score total 68     Rest 0     Walk 2     Stairs 3     Bath 4     Dress 2     Shop 4       CAT Score   CAT Score 24       mMRC Score   mMRC Score 4       UCSD: Self-administered rating of dyspnea associated with activities of daily living (ADLs) 6-point scale (0 = not at all to 5 = maximal or unable to do because of breathlessness)  Scoring Scores range from 0 to 120.  Minimally important difference is 5 units  CAT: CAT can identify the health impairment of COPD patients and is better correlated with disease progression.  CAT has a scoring range of zero to 40. The CAT score is classified into four groups of low (less than 10), medium (10 - 20), high (21-30) and very high (31-40) based on the impact level of disease on health status. A CAT score over 10 suggests significant symptoms.  A worsening CAT score could be explained by an exacerbation, poor medication adherence, poor inhaler technique, or progression of COPD or comorbid conditions.  CAT MCID is 2 points  mMRC: mMRC (Modified Medical Research Council) Dyspnea Scale is used to assess the degree of baseline functional disability in patients of respiratory disease due to dyspnea. No minimal important difference is established. A decrease in score of 1 point or greater is considered a positive change.   Pulmonary Function Assessment:  Pulmonary Function Assessment - 08/11/24 1157       Breath   Shortness of Breath Yes;Fear of Shortness of Breath;Limiting activity;Panic with Shortness of Breath          Exercise Target Goals: Exercise Program Goal: Individual exercise  prescription set using results from initial 6 min walk test and THRR while considering  patient's activity barriers and safety.   Exercise Prescription Goal: Initial exercise prescription builds to 30-45 minutes  a day of aerobic activity, 2-3 days per week.  Home exercise guidelines will be given to patient during program as part of exercise prescription that the participant will acknowledge.  Activity Barriers & Risk Stratification:   6 Minute Walk:  6 Minute Walk     Row Name 08/11/24 1137         6 Minute Walk   Phase Discharge     Distance 400 feet     Distance % Change 33.3 %     Distance Feet Change 100 ft     Walk Time 3.25 minutes     # of Rest Breaks 3  53sec, 50 sec, stopped at 5:04     METS 2.28     RPE 17     Perceived Dyspnea  4     VO2 Peak 7.99     Symptoms Yes (comment)     Comments SOB     Resting HR 107 bpm     Resting BP 136/72     Resting Oxygen  Saturation  98 %     Exercise Oxygen  Saturation  during 6 min walk 86 %     Max Ex. HR 121 bpm     Max Ex. BP 146/82     2 Minute Post BP 122/62       Interval HR   1 Minute HR 118     2 Minute HR 116     3 Minute HR 114     4 Minute HR 120     5 Minute HR 116     6 Minute HR 121     2 Minute Post HR 114     Interval Heart Rate? Yes       Interval Oxygen    Interval Oxygen ? Yes     Baseline Oxygen  Saturation % 93 %     1 Minute Oxygen  Saturation % 98 %     1 Minute Liters of Oxygen  4 L  NCC     2 Minute Oxygen  Saturation % 89 %     2 Minute Liters of Oxygen  4 L     3 Minute Oxygen  Saturation % 90 %     3 Minute Liters of Oxygen  4 L     4 Minute Oxygen  Saturation % 90 %     4 Minute Liters of Oxygen  4 L     5 Minute Oxygen  Saturation % 88 %     5 Minute Liters of Oxygen  4 L     6 Minute Oxygen  Saturation % 86 %     6 Minute Liters of Oxygen  4 L     2 Minute Post Oxygen  Saturation % 89 %     2 Minute Post Liters of Oxygen  4 L        Oxygen  Initial Assessment:   Oxygen  Re-Evaluation:   Oxygen  Re-Evaluation     Row Name 03/29/24 0936 04/12/24 1152 05/19/24 1019 06/07/24 1034 07/19/24 0924     Program Oxygen  Prescription   Program Oxygen  Prescription E-Tanks;Continuous E-Tanks;Continuous E-Tanks;Continuous E-Tanks;Continuous E-Tanks;Continuous   Liters per minute 3 3 3 3 3      Home Oxygen    Home Oxygen  Device Home Concentrator;E-Tanks;Portable Concentrator Home Concentrator;E-Tanks;Portable Concentrator Home Concentrator;E-Tanks;Portable Concentrator Home Concentrator;E-Tanks;Portable Concentrator Home Concentrator;E-Tanks;Portable Concentrator   Sleep Oxygen  Prescription -- Continuous Continuous Continuous Continuous   Liters per minute 3 3 3 3 3    Home Exercise Oxygen  Prescription Continuous Continuous Continuous Continuous Continuous   Liters per minute 3 3 3  3 3   Home Resting Oxygen  Prescription Continuous Continuous Continuous Continuous Continuous   Liters per minute 3 3 3 3 3    Compliance with Home Oxygen  Use Yes Yes Yes Yes --     Goals/Expected Outcomes   Short Term Goals To learn and exhibit compliance with exercise, home and travel O2 prescription;To learn and understand importance of monitoring SPO2 with pulse oximeter and demonstrate accurate use of the pulse oximeter.;To learn and understand importance of maintaining oxygen  saturations>88%;To learn and demonstrate proper pursed lip breathing techniques or other breathing techniques. ;To learn and demonstrate proper use of respiratory medications -- To learn and exhibit compliance with exercise, home and travel O2 prescription;To learn and understand importance of monitoring SPO2 with pulse oximeter and demonstrate accurate use of the pulse oximeter.;To learn and understand importance of maintaining oxygen  saturations>88%;To learn and demonstrate proper pursed lip breathing techniques or other breathing techniques. ;To learn and demonstrate proper use of respiratory medications To learn and exhibit compliance with  exercise, home and travel O2 prescription;To learn and understand importance of monitoring SPO2 with pulse oximeter and demonstrate accurate use of the pulse oximeter.;To learn and understand importance of maintaining oxygen  saturations>88%;To learn and demonstrate proper pursed lip breathing techniques or other breathing techniques. ;To learn and demonstrate proper use of respiratory medications To learn and exhibit compliance with exercise, home and travel O2 prescription;To learn and understand importance of monitoring SPO2 with pulse oximeter and demonstrate accurate use of the pulse oximeter.;To learn and understand importance of maintaining oxygen  saturations>88%;To learn and demonstrate proper pursed lip breathing techniques or other breathing techniques. ;To learn and demonstrate proper use of respiratory medications   Long  Term Goals Exhibits compliance with exercise, home  and travel O2 prescription;Maintenance of O2 saturations>88%;Compliance with respiratory medication;Verbalizes importance of monitoring SPO2 with pulse oximeter and return demonstration;Exhibits proper breathing techniques, such as pursed lip breathing or other method taught during program session;Demonstrates proper use of MDI's -- Exhibits compliance with exercise, home  and travel O2 prescription;Maintenance of O2 saturations>88%;Compliance with respiratory medication;Verbalizes importance of monitoring SPO2 with pulse oximeter and return demonstration;Exhibits proper breathing techniques, such as pursed lip breathing or other method taught during program session;Demonstrates proper use of MDI's Exhibits compliance with exercise, home  and travel O2 prescription;Maintenance of O2 saturations>88%;Compliance with respiratory medication;Verbalizes importance of monitoring SPO2 with pulse oximeter and return demonstration;Exhibits proper breathing techniques, such as pursed lip breathing or other method taught during program  session;Demonstrates proper use of MDI's Exhibits compliance with exercise, home  and travel O2 prescription;Maintenance of O2 saturations>88%;Compliance with respiratory medication;Verbalizes importance of monitoring SPO2 with pulse oximeter and return demonstration;Exhibits proper breathing techniques, such as pursed lip breathing or other method taught during program session;Demonstrates proper use of MDI's   Comments John [name]'s breathing has been about the same since last goal check-in. He states with the heat it has been much more difficult and he doesn't go outside when it's hot. He is compliant with his oxygen  at 3L all the time. He takes all his respiratory medications as prescribed. He states when he gets SOB a lot of times he will just have to sit still and wait for his HR to go down. Encouraged to use the PLB to help also. John [name]'s breathing continues to be the same since the last goal check in. He stated that he has good days and bad days. The heat/humidity has made it difficult the past month and only goes outside if he has to. He is compliant  with 3L and does his PLB when he feels SOB. John [name] is compliant with his oxygen  around the clock.  He is not active at home but was encouraged to do more to help with breathing.  He does use his PLB and it is helpful.  He does not use nebulizer daily and only when he needs it.  He was encouraged to use it daily and then extra when he needs it.  His sats are doing well. John [name] is doing well with his oxygen . His sats have been in the low 90s.  He using his nebulizer routinely and using his inhalers regularly.  He uses his PLB to help.  He is somewhat compliant with his CPAP but does not like it and finds that it will spook him awake sometimes. John [name] is doing well in rehab. He has started to use his breathing treatments more in the morning when he has a hard time breathing and it helps.  He is compliant with using his oxygen .  He continues to use PLB routinely and sats are in  low 90s upper 80s.  Minimal movement still wears him out.  He has not been doing breathing exercises or flutter valve.  He was encourged to work on his insipratometer more.   Goals/Expected Outcomes Short: Continue to attend rehab. Long: Use PLB to help with SOB. Short: Continue to attend rehab. Long: Use PLB to help with SOB. Short: Continue to use PLB routinely Long: Use nebulizer more consistently Short: Continue to use treatments Long: Continue to work on pursed lip breathing Short: Treatments daily Long; Do breathing exercises.    Row Name 08/11/24 1153             Program Oxygen  Prescription   Program Oxygen  Prescription None       Liters per minute 3         Home Oxygen    Home Oxygen  Device Home Concentrator;E-Tanks;Portable Concentrator       Sleep Oxygen  Prescription Continuous       Liters per minute 3       Home Exercise Oxygen  Prescription Continuous       Liters per minute 3       Home Resting Oxygen  Prescription Continuous       Liters per minute 3       Compliance with Home Oxygen  Use Yes         Goals/Expected Outcomes   Short Term Goals To learn and exhibit compliance with exercise, home and travel O2 prescription;To learn and understand importance of monitoring SPO2 with pulse oximeter and demonstrate accurate use of the pulse oximeter.;To learn and understand importance of maintaining oxygen  saturations>88%;To learn and demonstrate proper pursed lip breathing techniques or other breathing techniques. ;To learn and demonstrate proper use of respiratory medications       Long  Term Goals Exhibits compliance with exercise, home  and travel O2 prescription;Maintenance of O2 saturations>88%;Compliance with respiratory medication;Verbalizes importance of monitoring SPO2 with pulse oximeter and return demonstration;Exhibits proper breathing techniques, such as pursed lip breathing or other method taught during program session;Demonstrates proper use of MDI's       Comments John [name]  is nearing graduation.  He has been compliant with his oxygen , but not his breathing exercises.  He was encouraged to do his exercises more to improve/maintain.  he is using his nebulizer more!!  His saturations have been good excpet when transitioning between sitting and standing.  Any standing wears him and his breathing out.  Goals/Expected Outcomes Short: daily breathing exercises Long; conitnue to monitor saturations          Oxygen  Discharge (Final Oxygen  Re-Evaluation):  Oxygen  Re-Evaluation - 08/11/24 1153       Program Oxygen  Prescription   Program Oxygen  Prescription None    Liters per minute 3      Home Oxygen    Home Oxygen  Device Home Concentrator;E-Tanks;Portable Concentrator    Sleep Oxygen  Prescription Continuous    Liters per minute 3    Home Exercise Oxygen  Prescription Continuous    Liters per minute 3    Home Resting Oxygen  Prescription Continuous    Liters per minute 3    Compliance with Home Oxygen  Use Yes      Goals/Expected Outcomes   Short Term Goals To learn and exhibit compliance with exercise, home and travel O2 prescription;To learn and understand importance of monitoring SPO2 with pulse oximeter and demonstrate accurate use of the pulse oximeter.;To learn and understand importance of maintaining oxygen  saturations>88%;To learn and demonstrate proper pursed lip breathing techniques or other breathing techniques. ;To learn and demonstrate proper use of respiratory medications    Long  Term Goals Exhibits compliance with exercise, home  and travel O2 prescription;Maintenance of O2 saturations>88%;Compliance with respiratory medication;Verbalizes importance of monitoring SPO2 with pulse oximeter and return demonstration;Exhibits proper breathing techniques, such as pursed lip breathing or other method taught during program session;Demonstrates proper use of MDI's    Comments John [name] is nearing graduation.  He has been compliant with his oxygen , but not his  breathing exercises.  He was encouraged to do his exercises more to improve/maintain.  he is using his nebulizer more!!  His saturations have been good excpet when transitioning between sitting and standing.  Any standing wears him and his breathing out.    Goals/Expected Outcomes Short: daily breathing exercises Long; conitnue to monitor saturations          Initial Exercise Prescription:   Perform Capillary Blood Glucose checks as needed.  Exercise Prescription Changes:   Exercise Prescription Changes     Row Name 03/01/24 1500 03/29/24 1500 04/12/24 1300 05/07/24 0800 06/30/24 1300     Response to Exercise   Blood Pressure (Admit) 128/74 126/64 130/68 140/60 138/70   Blood Pressure (Exercise) 130/60 -- -- -- --   Blood Pressure (Exit) 134/60 108/64 136/62 119/76 118/70   Heart Rate (Admit) 120 bpm 121 bpm 115 bpm 113 bpm 114 bpm   Heart Rate (Exercise) 124 bpm 109 bpm 121 bpm 116 bpm 120 bpm   Heart Rate (Exit) 120 bpm 111 bpm 109 bpm 109 bpm 72 bpm   Oxygen  Saturation (Admit) 90 % 91 % 90 % 93 % 88 %   Oxygen  Saturation (Exercise) 91 % 93 % 90 % 96 % 93 %   Oxygen  Saturation (Exit) 93 % 95 % 92 % 97 % 93 %   Rating of Perceived Exertion (Exercise) 15 13 14 15 15    Perceived Dyspnea (Exercise) 3 3 2 2 3    Duration Continue with 30 min of aerobic exercise without signs/symptoms of physical distress. Continue with 30 min of aerobic exercise without signs/symptoms of physical distress. Continue with 30 min of aerobic exercise without signs/symptoms of physical distress. Continue with 30 min of aerobic exercise without signs/symptoms of physical distress. Continue with 30 min of aerobic exercise without signs/symptoms of physical distress.   Intensity THRR unchanged THRR unchanged THRR unchanged THRR unchanged THRR unchanged     Progression   Progression Continue  to progress workloads to maintain intensity without signs/symptoms of physical distress. Continue to progress workloads to  maintain intensity without signs/symptoms of physical distress. Continue to progress workloads to maintain intensity without signs/symptoms of physical distress. Continue to progress workloads to maintain intensity without signs/symptoms of physical distress. Continue to progress workloads to maintain intensity without signs/symptoms of physical distress.     Resistance Training   Training Prescription Yes Yes Yes Yes Yes   Weight 3 3 3 3 3    Reps 10-15 10-15 10-15 10-15 10-15     Oxygen    Oxygen  Continuous Continuous Continuous Continuous Continuous   Liters 3 4 3 3 3      NuStep   Level -- 2 2 2 2    SPM -- 43 52 50 59   Minutes -- 30 30 30 30    METs -- 1.5 1.6 1.7 1.7     Oxygen    Maintain Oxygen  Saturation -- 88% or higher 88% or higher 88% or higher 88% or higher    Row Name 07/21/24 1200 07/28/24 1200 08/16/24 1000         Response to Exercise   Blood Pressure (Admit) 132/64 122/60 128/70     Blood Pressure (Exit) 110/66 132/60 122/62     Heart Rate (Admit) 108 bpm 104 bpm 105 bpm     Heart Rate (Exercise) 120 bpm 113 bpm 111 bpm     Heart Rate (Exit) 110 bpm 116 bpm 106 bpm     Oxygen  Saturation (Admit) 91 % 91 % 90 %     Oxygen  Saturation (Exercise) 92 % 92 % 95 %     Oxygen  Saturation (Exit) 94 % 91 % 94 %     Rating of Perceived Exertion (Exercise) 14 15 15      Perceived Dyspnea (Exercise) 3 3 3      Duration Continue with 30 min of aerobic exercise without signs/symptoms of physical distress. Continue with 30 min of aerobic exercise without signs/symptoms of physical distress. Continue with 45 min of aerobic exercise without signs/symptoms of physical distress.     Intensity THRR unchanged THRR unchanged THRR unchanged       Progression   Progression Continue to progress workloads to maintain intensity without signs/symptoms of physical distress. Continue to progress workloads to maintain intensity without signs/symptoms of physical distress. Continue to progress  workloads to maintain intensity without signs/symptoms of physical distress.       Resistance Training   Training Prescription Yes Yes Yes     Weight 3 3 3      Reps 10-15 10-15 10-15       Oxygen    Oxygen  Continuous Continuous Continuous     Liters 3 3 3        NuStep   Level 2 2 2      SPM 49 50 55     Minutes 30 30 30      METs 1.6 1.7 1.7       Oxygen    Maintain Oxygen  Saturation 88% or higher 88% or higher 88% or higher        Exercise Comments:   Exercise Goals and Review:   Exercise Goals Re-Evaluation :  Exercise Goals Re-Evaluation     Row Name 03/03/24 1151 03/29/24 0935 04/12/24 1142 05/19/24 1007 06/07/24 0942     Exercise Goal Re-Evaluation   Exercise Goals Review Increase Physical Activity;Increase Strength and Stamina;Understanding of Exercise Prescription Increase Strength and Stamina;Increase Physical Activity;Able to understand and use rate of perceived exertion (RPE) scale;Able to  understand and use Dyspnea scale;Understanding of Exercise Prescription Increase Physical Activity;Increase Strength and Stamina;Understanding of Exercise Prescription Increase Physical Activity;Increase Strength and Stamina;Understanding of Exercise Prescription Increase Physical Activity;Increase Strength and Stamina;Understanding of Exercise Prescription   Comments John [name] is doing well in rehab. He has not increased his level on the nustep but his SPM are increasing slightly. He is exercsing at a RPE of 13. He stated that he does feel a difference in his energy levels after exercise. We talking about energy saviing techniques when doing activities such as grocery shopping. John [name] is doing well in rehab. He states he does not do any exercise at home that this is his only exercise regimen. He is slowly increasing his SPM on the Nustep. He does have to take breaks often due to SOB. John [name] in doing well in rehab. He is deconditioned but has been improving in rehab. He continues to work on his SPM  . He gets very winded during exercise and focuses on hos PLB. Will continue to montior and progress as able John [name] has been doing well in rehab. He is still no doing much at home and was encouraged to move more at home especially on days he feels better.  He is limited by his breathing and fatigue.  We talked about not sitting as much as it does nothing to build his strength and stamina.  He was open to trying to move more. John [name] is back today after colonscopy and CT scans last week.  He was encouraged to continue to exercise to [facility] his stamina.  He is not doing much on his off days currently as he is not breathing as well.   Expected Outcomes Short: practice energy techniques   long term: continue to attend rehab. Short: Continue to attend rehab to increase strength and stamina. Long: Incorporate exercise at home. Short: Continue to attend rehab to increase strength and stamina. Long: Incorporate exercise at home. Short; Move more at home Long; Continue to improve strength Short: Move more on good days Long: Continue to exercise    Row Name 07/19/24 0940 08/11/24 1141           Exercise Goal Re-Evaluation   Exercise Goals Review Increase Physical Activity;Increase Strength and Stamina;Understanding of Exercise Prescription Increase Physical Activity;Increase Strength and Stamina;Understanding of Exercise Prescription      Comments John [name] is still not doing much as home.  We talked about prepping for .  We also discussed thinking about maintenance for after graduation in order to keep exercising. John [name] improved his post by 115ft!!  He will be graduating soon and still not doing much at home.  We have convinced him to give maintenance a try to keep exercising.  He has committeed to trying it out for a month.      Expected Outcomes Short: Start to move more at home Long: Think about maintenance for after graduation Short; graduate Long; Continue to exercise to maintain gains         Discharge  Exercise Prescription (Final Exercise Prescription Changes):  Exercise Prescription Changes - 08/16/24 1000       Response to Exercise   Blood Pressure (Admit) 128/70    Blood Pressure (Exit) 122/62    Heart Rate (Admit) 105 bpm    Heart Rate (Exercise) 111 bpm    Heart Rate (Exit) 106 bpm    Oxygen  Saturation (Admit) 90 %    Oxygen  Saturation (Exercise) 95 %    Oxygen  Saturation (Exit) 94 %  Rating of Perceived Exertion (Exercise) 15    Perceived Dyspnea (Exercise) 3    Duration Continue with 45 min of aerobic exercise without signs/symptoms of physical distress.    Intensity THRR unchanged      Progression   Progression Continue to progress workloads to maintain intensity without signs/symptoms of physical distress.      Resistance Training   Training Prescription Yes    Weight 3    Reps 10-15      Oxygen    Oxygen  Continuous    Liters 3      NuStep   Level 2    SPM 55    Minutes 30    METs 1.7      Oxygen    Maintain Oxygen  Saturation 88% or higher          Nutrition:  Target Goals: Understanding of nutrition guidelines, daily intake of sodium 1500mg , cholesterol 200mg , calories 30% from fat and 7% or less from saturated fats, daily to have 5 or more servings of fruits and vegetables.  Biometrics:   Post Biometrics - 08/11/24 1140        Post  Biometrics   Height 5' 9 (1.753 m)    Weight 146 lb 4.8 oz (66.4 kg)    Waist Circumference 34 inches    Hip Circumference 37.5 inches    Waist to Hip Ratio 0.91 %    BMI (Calculated) 21.59    Grip Strength 38.6 kg    Single Leg Stand 8.2 seconds          Nutrition Therapy Plan and Nutrition Goals:   Nutrition Assessments:  MEDIFICTS Score Key: >=70 Need to make dietary changes  40-70 Heart Healthy Diet <= 40 Therapeutic Level Cholesterol Diet  Flowsheet Row PULMONARY REHAB CHRONIC OBSTRUCTIVE PULMONARY DISEASE from 08/11/2024 in [facility]  Picture Your Plate Total Score  on Admission 50  Picture Your Plate Total Score on Discharge 48   Picture Your Plate Scores: <59 Unhealthy dietary pattern with much room for improvement. 41-50 Dietary pattern unlikely to meet recommendations for good health and room for improvement. 51-60 More healthful dietary pattern, with some room for improvement.  >60 Healthy dietary pattern, although there may be some specific behaviors that could be improved.    Nutrition Goals Re-Evaluation:  Nutrition Goals Re-Evaluation     Row Name 03/03/24 1321 03/29/24 0932 04/12/24 1147 05/19/24 1011 06/07/24 0957     Goals   Nutrition Goal Healthy eating Healthy eating. Healthy eating. Short: increase water intake. Long: try to choose healthy options for meals. Short; Continue to try to drink more water Long: Continue to focus on eating around the clock   Comment John [name] is doing well in rehab. He stated that he is eating wha he likes to eat and really only eats about two meals a day. He does not drink a lot of water and we talked about increasing his water intake. He does drink diet sodas throughtout the day. John [name] states he is trying to eat healthy for the most part, he doesn't drink a lot of water, mostly tea. States he feels he is staying hydrated enough during the heat. Encouraged to drink more water. John [name] is doing well in rehab. He states that he is trying to eat healthy and pick healthier options for meals. He continues to not drink much water but does drink sweet tea throughout the day. He has cut back on sodas. He was encourged to drink more water throughtout the day.  John [name] is still not drinking enough water.  We talked about how more water will help with his fluid balance and breathing some. He is also trying to add in some more protein. He eats when he is hungry but still not eating a lot. John [name] trying to eat some. He was out with colonoscopy.  He is still drinking ensure every day and working on trying to gain weight.  He is now being worked  up for colon cancer and was encouraged to make sure he continues to eat.   Expected Outcome Short: increase water intake long term: try to choose healthy options for meals Short: increase water intake. Long: try to choose healthy options for meals. Short: increase water intake. Long: try to choose healthy options for meals. Short; Continue to try to drink more water Long: Continue to focus on eating around the clock Short: Continue to drink Ensure for nutrition Long: Continue to drink more    Row Name 07/19/24 0932 08/11/24 1148           Goals   Nutrition Goal Short: Continue to drink Ensure for nutrition Long: Continue to drink more Short; Increase protein Long: Continue to eat      Comment John [name] is still not hungry.  He makes himself eat.  He is drinking his ensure in the morning with his meds.  He does have PB sandwich each day.  They went out for Mexican last night.  We talked about making sure that he gets enough protein to avoid caexia effects. John [name] is still not always hungry but usually prefers beef and pork when he does eat.  he does not eat much fruits and vegetables.  Reminded him about the caexia effects and to supplement with Boost or Ensure on days he does not want to eat.      Expected Outcome Short; Increase protein Long: Continue to eat Continue to eat and increase protein         Nutrition Goals Discharge (Final Nutrition Goals Re-Evaluation):  Nutrition Goals Re-Evaluation - 08/11/24 1148       Goals   Nutrition Goal Short; Increase protein Long: Continue to eat    Comment John [name] is still not always hungry but usually prefers beef and pork when he does eat.  he does not eat much fruits and vegetables.  Reminded him about the caexia effects and to supplement with Boost or Ensure on days he does not want to eat.    Expected Outcome Continue to eat and increase protein          Psychosocial: Target Goals: Acknowledge presence or absence of significant depression and/or  stress, maximize coping skills, provide positive support system. Participant is able to verbalize types and ability to use techniques and skills needed for reducing stress and depression.  Initial Review & Psychosocial Screening:   Quality of Life Scores:  Scores of 19 and below usually indicate a poorer quality of life in these areas.  A difference of  2-3 points is a clinically meaningful difference.  A difference of 2-3 points in the total score of the Quality of Life Index has been associated with significant improvement in overall quality of life, self-image, physical symptoms, and general health in studies assessing change in quality of life.   PHQ-9: Review Flowsheet  More data exists      08/11/2024 06/22/2024 01/30/2024 09/10/2023 04/16/2023  Depression screen PHQ 2/9  Decreased Interest 1 0 0 0 0  Down, Depressed, Hopeless 0 0 0  0 0  PHQ - 2 Score 1 0 0 0 0  Altered sleeping 3 - 0 0 -  Tired, decreased energy 2 - 2 0 -  Change in appetite 2 - 1 0 -  Feeling bad or failure about yourself  0 - 0 0 -  Trouble concentrating 0 - 0 0 -  Moving slowly or fidgety/restless 0 - 0 0 -  Suicidal thoughts 0 - 0 0 -  PHQ-9 Score 8 - 3  0  -  Difficult doing work/chores Somewhat difficult - Not difficult at all Not difficult at all -    Details       Data saved with a previous flowsheet row definition        Interpretation of Total Score  Total Score Depression Severity:  1-4 = Minimal depression, 5-9 = Mild depression, 10-14 = Moderate depression, 15-19 = Moderately severe depression, 20-27 = Severe depression   Psychosocial Evaluation and Intervention:   Psychosocial Re-Evaluation:  Psychosocial Re-Evaluation     Row Name 03/03/24 1258 03/29/24 0931 04/12/24 1144 05/19/24 1009 06/07/24 0952     Psychosocial Re-Evaluation   Current issues with None Identified None Identified Current Stress Concerns;Current Sleep Concerns Current Stress Concerns;Current Sleep Concerns Current  Stress Concerns;Current Sleep Concerns   Comments John [name] is doing well in rehab. He stated that he does not have very many issues with his sleep. He does get up during the night to use the restroom but is able to get back to sleep. He also stated that he does not have much stress in his life. He has his everyday worrying about his breathing but other then that he isnt very stressed. John [name] is doing well in rehab. He identifies no stressors in his life. He states he is sleeping ok, he is a very light sleeper so he will wake up easily during the night. John [name] is doing well in rehab. He stated that he get 4 hours of sleep and does okay. He is not napping during the day. He recently had a PET scan done of his intestines and said that they found something but was unsure. He is not really worried or stressed John [name] is doing well in rehab.  He still does not sleep well and usually up at 3am each day. He was encouraged to try to nap in his recliner.  He notes that he gets clogged when sleeping on side.  He is worried about his colonoscopy next week to get better look at mass in intestines.  He is really worried about whether or not he will tolerate procedure and wake up after.  He was encouraged to let us  know how it goes and we talked about it being better to be aware of what is going on for him. John [name] was just diagnosed with colon cancer and is awaiting to meet with oncology and surgeon.  He is over whelmed with the news and trying to get all the information he can.  We talked through some of the alternatives and he was encouraged to meet with doctors to discuss prognosis and options.  His cardiologist was encouraging as well.  He is still not sleeping well, and the new news is also weighing on him.  He is meeting with his pulmonologist this week.  He is praying for guideance   Expected Outcomes Short: continue to have outlets for stress   long : continue to exericse for ovr all stress relef Short: Continue to attend rehab.  Long:Continue to  maintain a stress free lifestyle. Short: Continue to attend rehab. Long:Continue to maintain a stress free lifestyle. Short; Get through colonoscopy Long: Continue to work on sleep Short: Get information to make decision Long: Continue to cope with new decisions   Interventions Encouraged to attend Pulmonary Rehabilitation for the exercise Encouraged to attend Pulmonary Rehabilitation for the exercise Encouraged to attend Pulmonary Rehabilitation for the exercise Encouraged to attend Pulmonary Rehabilitation for the exercise Encouraged to attend Pulmonary Rehabilitation for the exercise   Continue Psychosocial Services  Follow up required by staff Follow up required by staff Follow up required by staff Follow up required by staff Follow up required by staff    Row Name 07/19/24 0928 08/11/24 1145           Psychosocial Re-Evaluation   Current issues with Current Stress Concerns;Current Sleep Concerns Current Stress Concerns;Current Sleep Concerns      Comments John [name] has decided to forgo treatment and live life as much as he can.  He wants to enjoy his grandkids and make the most out of things that he is able.  He is at peace with his decision.  He talked to his kids and they are worried about him, but are coming to terms with his decision.  He has talked it over with his doctors too.  He is still not sleeping the best, but will sometimes lays awake. John [name] is still not sleeping the best, but does what he can and continues to wake around 3 am each day. He is trying to stay positive despite the cancer diagnosis.  His kids are starting to come around to his choices and immenent death.  He is taking each day as it comes and plans to continue.  We talked about how exercise is important for both his strength and mental health.      Expected Outcomes Short; Continue to make peace with decision  Long; Conitnue to live as much as possible. Short: Continue to stay positive Long: Continue to cope and  sleep when can.      Interventions Encouraged to attend Pulmonary Rehabilitation for the exercise;Stress management education Encouraged to attend Pulmonary Rehabilitation for the exercise      Continue Psychosocial Services  Follow up required by staff Follow up required by staff         Psychosocial Discharge (Final Psychosocial Re-Evaluation):  Psychosocial Re-Evaluation - 08/11/24 1145       Psychosocial Re-Evaluation   Current issues with Current Stress Concerns;Current Sleep Concerns    Comments John [name] is still not sleeping the best, but does what he can and continues to wake around 3 am each day. He is trying to stay positive despite the cancer diagnosis.  His kids are starting to come around to his choices and immenent death.  He is taking each day as it comes and plans to continue.  We talked about how exercise is important for both his strength and mental health.    Expected Outcomes Short: Continue to stay positive Long: Continue to cope and sleep when can.    Interventions Encouraged to attend Pulmonary Rehabilitation for the exercise    Continue Psychosocial Services  Follow up required by staff           Education: Education Goals: Education classes will be provided on a weekly basis, covering required topics. Participant will state understanding/return demonstration of topics presented.  Learning Barriers/Preferences:   Education Topics: How Lungs Work and Diseases: - Discuss the anatomy of the lungs  and diseases that can affect the lungs, such as COPD.   Exercise: -Discuss the importance of exercise, FITT principles of exercise, normal and abnormal responses to exercise, and how to exercise safely.   Environmental Irritants: -Discuss types of environmental irritants and how to limit exposure to environmental irritants.   Meds/Inhalers and oxygen : - Discuss respiratory medications, definition of an inhaler and oxygen , and the proper way to use an inhaler and  oxygen .   Energy Saving Techniques: - Discuss methods to conserve energy and decrease shortness of breath when performing activities of daily living.  Flowsheet Row PULMONARY REHAB CHRONIC OBSTRUCTIVE PULMONARY DISEASE from 04/21/2024 in [facility] [place] [facility]  Date 03/17/24  Educator HB  Instruction Review Code 1- Verbalizes Understanding    Bronchial Hygiene / Breathing Techniques: - Discuss breathing mechanics, pursed-lip breathing technique,  proper posture, effective ways to clear airways, and other functional breathing techniques   Cleaning Equipment: - Provides group verbal and written instruction about the health risks of elevated stress, cause of high stress, and healthy ways to reduce stress.   Nutrition I: Fats: - Discuss the types of cholesterol, what cholesterol does to the body, and how cholesterol levels can be controlled. Flowsheet Row PULMONARY REHAB CHRONIC OBSTRUCTIVE PULMONARY DISEASE from 04/21/2024 in [place] [facility]  Date 03/31/24  Educator jh  Instruction Review Code 1- Verbalizes Understanding    Nutrition II: Labels: -Discuss the different components of food labels and how to read food labels. Flowsheet Row PULMONARY REHAB CHRONIC OBSTRUCTIVE PULMONARY DISEASE from 04/21/2024 in [place] [facility]  Date 03/31/24  Educator jh  Instruction Review Code 1- Verbalizes Understanding    Respiratory Infections: - Discuss the signs and symptoms of respiratory infections, ways to prevent respiratory infections, and the importance of seeking medical treatment when having a respiratory infection.   Stress I: Signs and Symptoms: - Discuss the causes of stress, how stress may lead to anxiety and depression, and ways to limit stress. Flowsheet Row PULMONARY REHAB CHRONIC OBSTRUCTIVE PULMONARY DISEASE from 04/21/2024 in [place] [facility]  Date 02/04/24  Educator [facility]  Instruction Review Code 1- Verbalizes  Understanding    Stress II: Relaxation: -Discuss relaxation techniques to limit stress. Flowsheet Row PULMONARY REHAB CHRONIC OBSTRUCTIVE PULMONARY DISEASE from 04/21/2024 in [place] [facility]  Date 02/04/24  Educator [facility]  Instruction Review Code 1- Verbalizes Understanding    Oxygen  for Home/Travel: - Discuss how to prepare for travel when on oxygen  and proper ways to transport and store oxygen  to ensure safety.   Knowledge Questionnaire Score:  Knowledge Questionnaire Score - 08/11/24 1157       Knowledge Questionnaire Score   Pre Score 15/18    Post Score 17/18          Core Components/Risk Factors/Patient Goals at Admission:   Core Components/Risk Factors/Patient Goals Review:   Goals and Risk Factor Review     Row Name 03/03/24 1324 03/29/24 0933 04/12/24 1150 05/19/24 1013 06/07/24 1027     Core Components/Risk Factors/Patient Goals Review   Personal Goals Review Weight Management/Obesity;Stress;Improve shortness of breath with ADL's Weight Management/Obesity;Improve shortness of breath with ADL's;Develop more efficient breathing techniques such as purse lipped breathing and diaphragmatic breathing and practicing self-pacing with activity. Weight Management/Obesity;Improve shortness of breath with ADL's;Increase knowledge of respiratory medications and ability to use respiratory devices properly.;Develop more efficient breathing techniques such as purse lipped breathing and diaphragmatic breathing and practicing self-pacing with activity. Weight Management/Obesity;Improve shortness of breath with ADL's;Increase knowledge  of respiratory medications and ability to use respiratory devices properly.;Develop more efficient breathing techniques such as purse lipped breathing and diaphragmatic breathing and practicing self-pacing with activity. Weight Management/Obesity;Improve shortness of breath with ADL's;Increase knowledge of respiratory medications and ability to  use respiratory devices properly.;Develop more efficient breathing techniques such as purse lipped breathing and diaphragmatic breathing and practicing self-pacing with activity.   Review John [name] is doing well in rehab. He John [name] is doing well in rehab. He takes all his medications as prescribed. He checks his O2 at home and it often runs low like 85% when he has walked just a short distance. He doesn't check his BP at home, which is really hasn't been a problem for him here at rehab. John [name] is doing well in rehab. He is taking all medications as prescribed and continues to check his O2 stats outside of class. He is focusing on PLB when he gets SOB and when his oxygen  drops. He does not check his BP at home, his BP has not been an issue during rehab but it is encourged to check at home to know paitents resting numbers John [name] is doing well in rehab.  He is gaining some weight.  He has been working on his breathing.  He still gets SOB easily and it is his biggest limitation.  He is working on PLB and finds it helpful.  He does note when he breathes through his mouth is upsets his stomach as he swallows air more then.  He was encouraged to use his nebulizer more regularly and extra when he needs it versus just when he needs it. John [name] has just been diagnosed colon cancer.  His weight is down from not eating after his news.  He still has good days and bad days with breathing.  He does not like his CPAP machine and sleeps in chair with it.  He is good about using his breathing treatments.   Expected Outcomes -- Short: Continue to check O2 saturation at home. Long: Report abnormalities to all healthcare professionals. Short: Continue to check O2 saturation at home. Long: Report abnormalities to all healthcare professionals. Short: Use nebulizer daily Long: Continue to work on using PLB Short; Find out more about his colon cancer Long: Continue use breathing treatments.    Row Name 07/19/24 0935 08/11/24 1150           Core  Components/Risk Factors/Patient Goals Review   Personal Goals Review Weight Management/Obesity;Improve shortness of breath with ADL's;Increase knowledge of respiratory medications and ability to use respiratory devices properly.;Develop more efficient breathing techniques such as purse lipped breathing and diaphragmatic breathing and practicing self-pacing with activity. Weight Management/Obesity;Improve shortness of breath with ADL's;Increase knowledge of respiratory medications and ability to use respiratory devices properly.;Develop more efficient breathing techniques such as purse lipped breathing and diaphragmatic breathing and practicing self-pacing with activity.      Review John [name] is not going to do anything about his cancer.  His weight is holding steady.  He really wants to gain weight.  He is compliant with his oxygen  and working on doing more. His breathing is his biggest limitation.  He is good about using his PLB and doing treatments now.  We talked about making sure he eats protein daily. John [name] is nearing graduation.  He has done well in program. His weight has continued to stay steady despite not always eating.  he is breathing better with PLB but is back to using nebulizer.  His pressures are all over the place  but he continues to keep an eye on it.  He is going to do maintenance for at least a month to continue to maintain. he is not doing his breathing exercises and we talked about why they are necessary to maintain.      Expected Outcomes Short: Add in more breathing exercises and protein Long; COnitnue to work on breathing Short: Add back in breathing exercises Long: Continue to maintain weight         Core Components/Risk Factors/Patient Goals at Discharge (Final Review):   Goals and Risk Factor Review - 08/11/24 1150       Core Components/Risk Factors/Patient Goals Review   Personal Goals Review Weight Management/Obesity;Improve shortness of breath with ADL's;Increase knowledge of  respiratory medications and ability to use respiratory devices properly.;Develop more efficient breathing techniques such as purse lipped breathing and diaphragmatic breathing and practicing self-pacing with activity.    Review John [name] is nearing graduation.  He has done well in program. His weight has continued to stay steady despite not always eating.  he is breathing better with PLB but is back to using nebulizer.  His pressures are all over the place but he continues to keep an eye on it.  He is going to do maintenance for at least a month to continue to maintain. he is not doing his breathing exercises and we talked about why they are necessary to maintain.    Expected Outcomes Short: Add back in breathing exercises Long: Continue to maintain weight          ITP Comments:  ITP Comments     Row Name 03/10/24 0754 04/05/24 0938 04/07/24 0830 05/05/24 1002 06/02/24 1426   ITP Comments 30 day review completed. ITP sent to Dr.Jehanzeb Memon, Medical Director of Pulmonary Rehab. Continue with ITP unless changes are made by physician. Called to check on patient when he did not show for rehab today.  Daril was having a bad breathing day and did not feel up to coming to rehab today.  He hopes to return on Wednesday and was encouraged to use his nebulizer an extra time today. 30 day review completed. ITP sent to Dr.Jehanzeb Memon, Medical Director of  Pulmonary Rehab. Continue with ITP unless changes are made by physician. 30 day review completed. ITP sent to Dr.Jehanzeb Memon, Medical Director of  Pulmonary Rehab. Continue with ITP unless changes are made by physician. 30 day review completed. ITP sent to Dr.Jehanzeb Memon, Medical Director of  Pulmonary Rehab. Continue with ITP unless changes are made by physician. Has been out this week with appts.    Row Name 06/30/24 1012 07/28/24 0806 08/24/24 0927       ITP Comments 30 day review completed. ITP sent to Dr.Jehanzeb Memon, Medical Director of   Pulmonary Rehab. Continue with ITP unless changes are made by physician. 30 day review completed. ITP sent to Dr.Jehanzeb Memon, Medical Director of  Pulmonary Rehab. Continue with ITP unless changes are made by physician. 30 day review completed. ITP sent to Dr.Jehanzeb Memon, Medical Director of  Pulmonary Rehab. Continue with ITP unless changes are made by physician. Still has one more visit to finish program.,        Comments: 30 day review

## 2024-08-25 ENCOUNTER — Ambulatory Visit (HOSPITAL_COMMUNITY)

## 2024-08-26 ENCOUNTER — Other Ambulatory Visit (HOSPITAL_BASED_OUTPATIENT_CLINIC_OR_DEPARTMENT_OTHER): Payer: Self-pay

## 2024-08-26 ENCOUNTER — Encounter (HOSPITAL_BASED_OUTPATIENT_CLINIC_OR_DEPARTMENT_OTHER): Payer: Self-pay | Admitting: Pulmonary Disease

## 2024-08-26 ENCOUNTER — Ambulatory Visit (INDEPENDENT_AMBULATORY_CARE_PROVIDER_SITE_OTHER): Admitting: Pulmonary Disease

## 2024-08-26 VITALS — BP 131/73 | HR 103 | Temp 98.1°F | Ht 69.0 in | Wt 144.3 lb

## 2024-08-26 DIAGNOSIS — J441 Chronic obstructive pulmonary disease with (acute) exacerbation: Secondary | ICD-10-CM | POA: Diagnosis not present

## 2024-08-26 DIAGNOSIS — J9611 Chronic respiratory failure with hypoxia: Secondary | ICD-10-CM | POA: Diagnosis not present

## 2024-08-26 DIAGNOSIS — Z9981 Dependence on supplemental oxygen: Secondary | ICD-10-CM | POA: Diagnosis not present

## 2024-08-26 DIAGNOSIS — J9612 Chronic respiratory failure with hypercapnia: Secondary | ICD-10-CM

## 2024-08-26 MED ORDER — DOXYCYCLINE HYCLATE 100 MG PO TABS: 100.0000 mg | ORAL_TABLET | Freq: Two times a day (BID) | ORAL | 0 refills | Status: DC

## 2024-08-26 MED ORDER — SPACER/AERO-HOLDING CHAMBERS DEVI
0 refills | Status: AC
  Filled 2024-08-26: qty 1, 1d supply, fill #0

## 2024-08-26 MED ORDER — PREDNISONE 10 MG PO TABS: ORAL_TABLET | ORAL | 0 refills | Status: DC

## 2024-08-26 MED ORDER — SPACER/AERO-HOLDING CHAMBERS DEVI: 1.0000 | Freq: Two times a day (BID) | 0 refills | Status: DC

## 2024-08-26 MED ORDER — SODIUM CHLORIDE 3 % IN NEBU: INHALATION_SOLUTION | Freq: Two times a day (BID) | RESPIRATORY_TRACT | 3 refills | Status: AC | PRN

## 2024-08-26 NOTE — Patient Instructions (Signed)
  VISIT SUMMARY: You came in today for a follow-up on your chronic obstructive pulmonary disease (COPD) and respiratory failure. You have been experiencing a persistent cough with gray-green sputum, and your home oxygen  levels have been fluctuating. We discussed your current treatments and made some adjustments to help manage your symptoms better.  YOUR PLAN: -END-STAGE CHRONIC OBSTRUCTIVE PULMONARY DISEASE (COPD) WITH CHRONIC RESPIRATORY FAILURE: COPD is a chronic lung disease that makes it hard to breathe, and in your case, it has progressed to a severe stage causing respiratory failure. We have prescribed another round of antibiotics and a two-week course of prednisone  with a tapering schedule to help with your symptoms. You should use a concentrated saline solution in your nebulizer once or twice daily to moisten mucus and continue using the albuterol  nebulizer as needed. We will consult with a ventilator specialist to adjust your settings for better comfort. Routine breathing tests and CT scans are not needed at this time. We also reviewed proper inhaler techniques and considered using a spacer. It is important to use your ventilator during sleep for the best results.  INSTRUCTIONS: Please follow the new medication regimen as prescribed. Use the concentrated saline solution in your nebulizer once or twice daily and continue with the albuterol  nebulizer as needed. We will arrange a consultation with a ventilator specialist to adjust your settings. Routine breathing tests and CT scans are not necessary unless your symptoms worsen or you are hospitalized. Make sure to use your ventilator during sleep for optimal benefit. Follow up with us  if you experience any worsening of symptoms or have any concerns.                      Contains text generated by Abridge.                                 Contains text generated by Abridge.

## 2024-08-26 NOTE — Progress Notes (Signed)
 Subjective:    Patient ID: John [name], male    DOB: 1956-07-13, 68 y.o.   MRN: [id number]   68 y.o. 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    -He is on 3 L oxygen  in the daytime, 4 L on exertion, unable to use NIV during sleep but tries to use at least 4 hours in the daytime.  -100 pack-year smoking history , quit 2021 -assessment at [facility] in July 2022 -NOT a candidate for lung transplant due to coronary artery disease and infrarenal abdominal aortic aneurysm    Past Medical History:  PNA, HTN, HLD, CAD, Seizure, ETOH, infrarenal abdominal aortic aneurysm   Discussed the use of AI scribe software for clinical note transcription with the patient, who gave verbal consent to proceed.  History of Present Illness John [name] is a 68 year old male with end stage refractory COPD who presents for follow-up of respiratory failure with hypoxia and hypercarbia. He is accompanied by his daughter, [name].  He has persistent cough with a rattling sensation and ongoing gray-green sputum despite recent azithromycin  and prednisone . He takes Mucinex  regularly but feels it does not adequately control his symptoms.  Home oxygen  saturations fluctuate, with a drop to 84% noted on arrival today and up to 96-97% when sitting quietly. He is worried about needing higher oxygen  flow and would like to reduce his setting if safely possible.  He now uses albuterol  nebulizer, mainly at bedtime, which he did not require before this recent illness. He struggles with noninvasive ventilator use, tolerating about an hour before feeling suffocated and removing it. He feels the machine does not provide enough air, so it is difficult to use during sleep.  He uses Breztri , two puffs twice daily, and has experienced side effects when exceeding that dose. He uses the ventilator for at least four hours daily but has  trouble using it at night due to discomfort and fear of not waking up.  [name] asks about the needed frequency of breathing tests, CT scans, and whether a repeat arterial blood gas is indicated, referencing prior tests in 2021.     08/16/24 Zpak + pred  PET 03/2024 reviewed Pall care note reviewed   Significant tests/ events reviewed   Pulmonary testing:  PFT 12/09/19 >> FEV1 0.59 (16%), FEV1% 37, DLCO 27% A1AT 03/09/20 >> 110, MZ ABG 02/12/21 >> pH 7.28, PCO2 80.9, PO2 109 on 40% FiO2 PFT 04/11/21 >> FEV1 0.6 (18%), FEV1% 26, DLCO 21%   Chest Imaging:  CT angio chest 11/23/19 >> moderate centrilobular emphysema, mild infrahilar BTX, 6 mm nodule LUL CT chest 01/02/21 >> no change LUL nodule, new 1.1 cm LLL opacity CT chest 03/28/21 >> severe emphysema, 2.2 x 0.8 cm band like opacity RLL from ATX, mucus plugging, 3 mm nodule RUL, 1.6 x 0.7 cm nodularity LUL CT chest 04/09/21 >> diffuse centrilobular emphysema, stable nodule LUL, 8 mm nodule RLL CT chest 03/27/22 >> stable GGO LLL CT chest 03/17/23 >> stable GGO LLL   Review of Systems  neg for any significant sore throat, dysphagia, itching, sneezing, nasal congestion or excess/ purulent secretions, fever, chills, sweats, unintended wt loss, pleuritic or exertional cp, hempoptysis, orthopnea pnd or change in chronic leg swelling. Also denies presyncope, palpitations, heartburn, abdominal pain, nausea, vomiting, diarrhea or change in bowel or urinary habits, dysuria,hematuria, rash, arthralgias, visual complaints, headache, numbness weakness or ataxia.      Objective:  Physical Exam  Gen. Pleasant, well-nourished, in no distress , on [place] [facility] - no thrush, no pallor/icterus,no post nasal drip Neck: No JVD, no thyromegaly, no carotid bruits Lungs: no use of accessory muscles, no dullness to percussion, decreased BL  without rales or rhonchi  Cardiovascular: Rhythm regular, heart sounds  normal, no murmurs or gallops, no peripheral  edema Musculoskeletal: No deformities, no cyanosis or clubbing        Assessment & Plan:   Assessment and Plan Assessment & Plan End-stage chronic obstructive pulmonary disease (COPD) with chronic respiratory failure End-stage COPD with chronic respiratory failure, hypoxia, and hypercarbia. Persistent cough with grayish-green sputum suggests possible infection. Current treatment with Z-Pak and prednisone  has not fully resolved symptoms. Ventilator use is challenging due to discomfort and perceived insufficient air supply. Breathing tests and CT scans are not indicated as he will not alter management. Blood gas tests are only necessary if symptoms worsen or hospitalization occurs. - Prescribed another round of antibiotics, avoiding penicillin due to allergy. - Prescribed a two-week course of prednisone  with a tapering schedule. - Prescribed concentrated saline solution for nebulizer use once or twice daily to moisten mucus. - Continue albuterol  nebulizer as needed. - Consult with ventilator specialist to adjust settings for comfort. - Discontinued routine breathing tests and CT scans. - Educated on proper inhaler technique and considered spacer use. - Encouraged ventilator use during sleep for optimal benefit.

## 2024-08-27 ENCOUNTER — Telehealth (HOSPITAL_BASED_OUTPATIENT_CLINIC_OR_DEPARTMENT_OTHER): Payer: Self-pay

## 2024-08-27 NOTE — Telephone Encounter (Signed)
 [place] filled [place] and completed faxed and confirmation recevied

## 2024-08-30 ENCOUNTER — Inpatient Hospital Stay (HOSPITAL_COMMUNITY): Admission: RE | Admit: 2024-08-30 | Discharge: 2024-08-30 | Attending: Pulmonary Disease

## 2024-08-30 DIAGNOSIS — J4489 Other specified chronic obstructive pulmonary disease: Secondary | ICD-10-CM | POA: Diagnosis present

## 2024-08-30 NOTE — Progress Notes (Signed)
 Daily Session Note  Patient Details  Name: John [name] MRN: [id number] Date of Birth: 1955-10-19 Referring Provider:   Flowsheet Row PULMONARY REHAB COPD ORIENTATION from 02/02/2024 in [facility]  Referring Provider John Donning MD    Encounter Date: 08/30/2024  Check In:  Session Check In - 08/30/24 9077       Check-In   Supervising physician immediately available to respond to emergencies See telemetry face sheet for immediately available MD    Location [facility]    Staff Present John [name], BSN, RN, John Gelineau, MA, RCEP, CCRP, CCET;John [name], [place], Exercise Physiologist    Virtual Visit No    Medication changes reported     No    Fall or balance concerns reported    No    Tobacco Cessation No Change    Warm-up and Cool-down Performed on first and last piece of equipment    Resistance Training Performed Yes    VAD Patient? No    PAD/SET Patient? No      Pain Assessment   Currently in Pain? No/denies          Capillary Blood Glucose: No results found for this or any previous visit (from the past 24 hours).    Social History   Tobacco Use  Smoking Status Former   Current packs/day: 0.00   Average packs/day: 2.0 packs/day for 51.6 years (103.1 ttl pk-yrs)   Types: Cigarettes   Start date: 17   Quit date: 04/18/2020   Years since quitting: 4.3  Smokeless Tobacco Never  Tobacco Comments   2 plus packs per day. He has a 100 + pack - year history of tobacco abuse currently. Former 4 ppd for 25 years.    Goals Met:  Proper associated with RPD/PD & O2 Sat Independence with exercise equipment Improved SOB with ADL's Using PLB without cueing & demonstrates good technique Exercise tolerated well Personal goals reviewed No report of concerns or symptoms today Strength training completed today  Goals Unmet:  Not Applicable  Comments:  John [name] graduated today from  rehab with 36 sessions  completed.  Details of the patient's exercise prescription and what He needs to do in order to continue the prescription and progress were discussed with patient.  Patient was given a copy of prescription and goals.  Patient verbalized understanding. John [name] plans to continue to exercise by thinking about joining our maintenance program.

## 2024-08-30 NOTE — Progress Notes (Signed)
 Pulmonary Individual Treatment Plan  Patient Details  Name: John [name] MRN: [id number] Date of Birth: [birth year] Referring Provider:   Flowsheet Row PULMONARY REHAB COPD ORIENTATION from 02/02/2024 in [name]  Referring Provider [name] MD    Initial Encounter Date:  Flowsheet Row PULMONARY REHAB COPD ORIENTATION from 02/02/2024 in [facility]  Date 02/02/24    Visit Diagnosis: Chronic bronchitis with COPD (chronic obstructive pulmonary disease) (HCC)  Patient's Home Medications on Admission:  Current Outpatient Medications:    albuterol  (PROAIR  HFA) 108 (90 Base) MCG/ACT inhaler, Inhale 2 puffs into the lungs every 4 (four) hours as needed for wheezing or shortness of breath. 2 puffs every 4 hours as needed only  if your can't catch your breath, Disp: 8 g, Rfl: 5   albuterol  (PROVENTIL ) (2.5 MG/3ML) 0.083% nebulizer solution, Take 3 mLs (2.5 mg total) by nebulization every 4 (four) hours as needed for wheezing or shortness of breath., Disp: 360 mL, Rfl: 5   aspirin  EC 81 MG tablet, Take 81 mg by mouth daily., Disp: , Rfl:    atorvastatin  (LIPITOR ) 40 MG tablet, Take 1 tablet (40 mg total) by mouth daily., Disp: 90 tablet, Rfl: 3   azithromycin  (ZITHROMAX ) 250 MG tablet, Take two tabs x day 1, then 1 tab daily (Patient not taking: Reported on 08/26/2024), Disp: 6 tablet, Rfl: 0   budesonide -glycopyrrolate -formoterol  (BREZTRI  AEROSPHERE) 160-9-4.8 MCG/ACT AERO inhaler, Inhale 2 puffs into the lungs in the morning and at bedtime., Disp: 10.7 g, Rfl: 11   doxycycline  (VIBRA -TABS) 100 MG tablet, Take 1 tablet (100 mg total) by mouth 2 (two) times daily., Disp: 14 tablet, Rfl: 0   furosemide  (LASIX ) 40 MG tablet, Take 1 tablet (40 mg total) by mouth daily., Disp: 90 tablet, Rfl: 3   guaiFENesin  (MUCINEX ) 600 MG 12 hr tablet, Take 2 tablets (1,200 mg total) by mouth 2 (two) times daily as needed for cough or to loosen phlegm., Disp: ,  Rfl:    Magnesium  250 MG CAPS, Take by mouth daily., Disp: , Rfl:    Multiple Vitamin (MULTIVITAMIN WITH MINERALS) TABS tablet, Take 1 tablet by mouth daily., Disp: , Rfl:    nitroGLYCERIN  (NITROSTAT ) 0.4 MG SL tablet, Place 1 tablet (0.4 mg total) under the tongue every 5 (five) minutes as needed for chest pain., Disp: 25 tablet, Rfl: 6   OXYGEN , Inhale 2-3 L into the lungs., Disp: , Rfl:    predniSONE  (DELTASONE ) 10 MG tablet, Take 4 tablets daily x 5 days (Patient not taking: Reported on 08/26/2024), Disp: 20 tablet, Rfl: 0   predniSONE  (DELTASONE ) 10 MG tablet, Take 4 tabs  daily with food x 4 days, then 3 tabs daily x 4 days, then 2 tabs daily x 4 days, then 1 tab daily x4 days then stop. #40, Disp: 40 tablet, Rfl: 0   sodium chloride  HYPERTONIC 3 % nebulizer solution, Take by nebulization 2 (two) times daily as needed for other., Disp: 250 mL, Rfl: 3   Spacer/Aero-Holding Chambers DEVI, Use twice daily as directed, Disp: 1 each, Rfl: 0  Past Medical History: Past Medical History:  Diagnosis Date   Abdominal aortic aneurysm (AAA)    EVAR Dr. Lanis  2023   Alcohol abuse 12/27/2010   Allergic rhinitis, cause unspecified 12/27/2010   Anal warts 04/11/2012   CAD, NATIVE VESSEL 10/04/2010   COPD (chronic obstructive pulmonary disease) (HCC)    Stage 4 uses o2 3 L continous   History of blood transfusion  03/2012   related to nose bleed   HYPERLIPIDEMIA-MIXED 10/04/2010   HYPERTENSION, BENIGN 10/04/2010   Myocardial infarction (HCC) 09/16/2010   x2   On home O2    Pneumonia    when I was a kid   Stented coronary artery    Mid LAD    TOBACCO ABUSE 10/04/2010    Tobacco Use: Social History   Tobacco Use  Smoking Status Former   Current packs/day: 0.00   Average packs/day: 2.0 packs/day for 51.6 years (103.1 ttl pk-yrs)   Types: Cigarettes   Start date: 13   Quit date: 04/18/2020   Years since quitting: 4.3  Smokeless Tobacco Never  Tobacco Comments   2 plus packs per  day. He has a 100 + pack - year history of tobacco abuse currently. Former 4 ppd for 25 years.    Labs: Review Flowsheet  More data exists      Latest Ref Rng & Units 02/12/2021 03/22/2022 07/28/2022 10/14/2022 10/21/2023  Labs for ITP Cardiac and Pulmonary Rehab  Cholestrol 100 - 199 mg/dL - 833  - 840  821   LDL (calc) 0 - 99 mg/dL - 76  - 72  81   HDL-C >39 mg/dL - 77  - 76  86   Trlycerides 0 - 149 mg/dL - 70  - 55  57   PH, Arterial 7.35 - 7.45 7.283  - 7.265  - -  PCO2 arterial 32 - 48 mmHg 80.9  - 80.0  - -  Bicarbonate 20.0 - 28.0 mmol/L 31.4  - 36.7  - -  TCO2 22 - 32 mmol/L - - 39  - -  O2 Saturation % 97.2  - 97  - -     Pulmonary Assessment Scores:  Pulmonary Assessment Scores     Row Name 08/11/24 1156         ADL UCSD   ADL Phase Exit     SOB Score total 68     Rest 0     Walk 2     Stairs 3     Bath 4     Dress 2     Shop 4       CAT Score   CAT Score 24       mMRC Score   mMRC Score 4        UCSD: Self-administered rating of dyspnea associated with activities of daily living (ADLs) 6-point scale (0 = not at all to 5 = maximal or unable to do because of breathlessness)  Scoring Scores range from 0 to 120.  Minimally important difference is 5 units  CAT: CAT can identify the health impairment of COPD patients and is better correlated with disease progression.  CAT has a scoring range of zero to 40. The CAT score is classified into four groups of low (less than 10), medium (10 - 20), high (21-30) and very high (31-40) based on the impact level of disease on health status. A CAT score over 10 suggests significant symptoms.  A worsening CAT score could be explained by an exacerbation, poor medication adherence, poor inhaler technique, or progression of COPD or comorbid conditions.  CAT MCID is 2 points  mMRC: mMRC (Modified Medical Research Council) Dyspnea Scale is used to assess the degree of baseline functional disability in patients of respiratory  disease due to dyspnea. No minimal important difference is established. A decrease in score of 1 point or greater is considered a positive change.  Pulmonary Function Assessment:  Pulmonary Function Assessment - 08/11/24 1157       Breath   Shortness of Breath Yes;Fear of Shortness of Breath;Limiting activity;Panic with Shortness of Breath          Exercise Target Goals: Exercise Program Goal: Individual exercise prescription set using results from initial 6 min walk test and THRR while considering  patient's activity barriers and safety.   Exercise Prescription Goal: Initial exercise prescription builds to 30-45 minutes a day of aerobic activity, 2-3 days per week.  Home exercise guidelines will be given to patient during program as part of exercise prescription that the participant will acknowledge.  Education: Aerobic Exercise: - Group verbal and visual presentation on the components of exercise prescription. Introduces F.I.T.T principle from ACSM for exercise prescriptions.  Reviews F.I.T.T. principles of aerobic exercise including progression. Written material provided at class time. Flowsheet Row PULMONARY REHAB CHRONIC OBSTRUCTIVE PULMONARY DISEASE from 08/11/2024 in [facility]  Date 05/19/24  Educator [facility]  Instruction Review Code 1- Verbalizes Understanding    Education: Resistance Exercise: - Group verbal and visual presentation on the components of exercise prescription. Introduces F.I.T.T principle from ACSM for exercise prescriptions  Reviews F.I.T.T. principles of resistance exercise including progression. Written material provided at class time.    Education: Exercise & Equipment Safety: - Individual verbal instruction and demonstration of equipment use and safety with use of the equipment.   Education: Exercise Physiology & General Exercise Guidelines: - Group verbal and written instruction with models to review the exercise physiology of  the cardiovascular system and associated critical values. Provides general exercise guidelines with specific guidelines to those with heart or lung disease.  Flowsheet Row PULMONARY REHAB CHRONIC OBSTRUCTIVE PULMONARY DISEASE from 08/11/2024 in [facility]  Date 05/05/24  Educator jh  Instruction Review Code 1- Verbalizes Understanding    Education: Flexibility, Balance, Mind/Body Relaxation: - Group verbal and visual presentation with interactive activity on the components of exercise prescription. Introduces F.I.T.T principle from ACSM for exercise prescriptions. Reviews F.I.T.T. principles of flexibility and balance exercise training including progression. Also discusses the mind body connection.  Reviews various relaxation techniques to help reduce and manage stress (i.e. Deep breathing, progressive muscle relaxation, and visualization). Balance handout provided to take home. Written material provided at class time. Flowsheet Row PULMONARY REHAB CHRONIC OBSTRUCTIVE PULMONARY DISEASE from 08/11/2024 in [facility]  Date 07/28/24  Educator hb  Instruction Review Code 2- Demonstrated Understanding    Activity Barriers & Risk Stratification:   6 Minute Walk:  6 Minute Walk     Row Name 08/11/24 1137         6 Minute Walk   Phase Discharge     Distance 400 feet     Distance % Change 33.3 %     Distance Feet Change 100 ft     Walk Time 3.25 minutes     # of Rest Breaks 3  53sec, 50 sec, stopped at 5:04     METS 2.28     RPE 17     Perceived Dyspnea  4     VO2 Peak 7.99     Symptoms Yes (comment)     Comments SOB     Resting HR 107 bpm     Resting BP 136/72     Resting Oxygen  Saturation  98 %     Exercise Oxygen  Saturation  during 6 min walk 86 %     Max Ex. HR 121  bpm     Max Ex. BP 146/82     2 Minute Post BP 122/62       Interval HR   1 Minute HR 118     2 Minute HR 116     3 Minute HR 114     4 Minute HR 120     5 Minute  HR 116     6 Minute HR 121     2 Minute Post HR 114     Interval Heart Rate? Yes       Interval Oxygen    Interval Oxygen ? Yes     Baseline Oxygen  Saturation % 93 %     1 Minute Oxygen  Saturation % 98 %     1 Minute Liters of Oxygen  4 L  NCC     2 Minute Oxygen  Saturation % 89 %     2 Minute Liters of Oxygen  4 L     3 Minute Oxygen  Saturation % 90 %     3 Minute Liters of Oxygen  4 L     4 Minute Oxygen  Saturation % 90 %     4 Minute Liters of Oxygen  4 L     5 Minute Oxygen  Saturation % 88 %     5 Minute Liters of Oxygen  4 L     6 Minute Oxygen  Saturation % 86 %     6 Minute Liters of Oxygen  4 L     2 Minute Post Oxygen  Saturation % 89 %     2 Minute Post Liters of Oxygen  4 L       Oxygen  Initial Assessment:   Oxygen  Re-Evaluation:  Oxygen  Re-Evaluation     Row Name 03/29/24 0936 04/12/24 1152 05/19/24 1019 06/07/24 1034 07/19/24 0924     Program Oxygen  Prescription   Program Oxygen  Prescription E-Tanks;Continuous E-Tanks;Continuous E-Tanks;Continuous E-Tanks;Continuous E-Tanks;Continuous   Liters per minute 3 3 3 3 3      Home Oxygen    Home Oxygen  Device Home Concentrator;E-Tanks;Portable Concentrator Home Concentrator;E-Tanks;Portable Concentrator Home Concentrator;E-Tanks;Portable Concentrator Home Concentrator;E-Tanks;Portable Concentrator Home Concentrator;E-Tanks;Portable Concentrator   Sleep Oxygen  Prescription -- Continuous Continuous Continuous Continuous   Liters per minute 3 3 3 3 3    Home Exercise Oxygen  Prescription Continuous Continuous Continuous Continuous Continuous   Liters per minute 3 3 3 3 3    Home Resting Oxygen  Prescription Continuous Continuous Continuous Continuous Continuous   Liters per minute 3 3 3 3 3    Compliance with Home Oxygen  Use Yes Yes Yes Yes --     Goals/Expected Outcomes   Short Term Goals To learn and exhibit compliance with exercise, home and travel O2 prescription;To learn and understand importance of monitoring SPO2 with pulse  oximeter and demonstrate accurate use of the pulse oximeter.;To learn and understand importance of maintaining oxygen  saturations>88%;To learn and demonstrate proper pursed lip breathing techniques or other breathing techniques. ;To learn and demonstrate proper use of respiratory medications -- To learn and exhibit compliance with exercise, home and travel O2 prescription;To learn and understand importance of monitoring SPO2 with pulse oximeter and demonstrate accurate use of the pulse oximeter.;To learn and understand importance of maintaining oxygen  saturations>88%;To learn and demonstrate proper pursed lip breathing techniques or other breathing techniques. ;To learn and demonstrate proper use of respiratory medications To learn and exhibit compliance with exercise, home and travel O2 prescription;To learn and understand importance of monitoring SPO2 with pulse oximeter and demonstrate accurate use of the pulse oximeter.;To learn and understand importance of maintaining oxygen  saturations>88%;To learn  and demonstrate proper pursed lip breathing techniques or other breathing techniques. ;To learn and demonstrate proper use of respiratory medications To learn and exhibit compliance with exercise, home and travel O2 prescription;To learn and understand importance of monitoring SPO2 with pulse oximeter and demonstrate accurate use of the pulse oximeter.;To learn and understand importance of maintaining oxygen  saturations>88%;To learn and demonstrate proper pursed lip breathing techniques or other breathing techniques. ;To learn and demonstrate proper use of respiratory medications   Long  Term Goals Exhibits compliance with exercise, home  and travel O2 prescription;Maintenance of O2 saturations>88%;Compliance with respiratory medication;Verbalizes importance of monitoring SPO2 with pulse oximeter and return demonstration;Exhibits proper breathing techniques, such as pursed lip breathing or other method taught  during program session;Demonstrates proper use of MDI's -- Exhibits compliance with exercise, home  and travel O2 prescription;Maintenance of O2 saturations>88%;Compliance with respiratory medication;Verbalizes importance of monitoring SPO2 with pulse oximeter and return demonstration;Exhibits proper breathing techniques, such as pursed lip breathing or other method taught during program session;Demonstrates proper use of MDI's Exhibits compliance with exercise, home  and travel O2 prescription;Maintenance of O2 saturations>88%;Compliance with respiratory medication;Verbalizes importance of monitoring SPO2 with pulse oximeter and return demonstration;Exhibits proper breathing techniques, such as pursed lip breathing or other method taught during program session;Demonstrates proper use of MDI's Exhibits compliance with exercise, home  and travel O2 prescription;Maintenance of O2 saturations>88%;Compliance with respiratory medication;Verbalizes importance of monitoring SPO2 with pulse oximeter and return demonstration;Exhibits proper breathing techniques, such as pursed lip breathing or other method taught during program session;Demonstrates proper use of MDI's   Comments John [name]'s breathing has been about the same since last goal check-in. He states with the heat it has been much more difficult and he doesn't go outside when it's hot. He is compliant with his oxygen  at 3L all the time. He takes all his respiratory medications as prescribed. He states when he gets SOB a lot of times he will just have to sit still and wait for his HR to go down. Encouraged to use the PLB to help also. John [name]'s breathing continues to be the same since the last goal check in. He stated that he has good days and bad days. The heat/humidity has made it difficult the past month and only goes outside if he has to. He is compliant with 3L and does his PLB when he feels SOB. John [name] is compliant with his oxygen  around the clock.  He is not active at  home but was encouraged to do more to help with breathing.  He does use his PLB and it is helpful.  He does not use nebulizer daily and only when he needs it.  He was encouraged to use it daily and then extra when he needs it.  His sats are doing well. John [name] is doing well with his oxygen . His sats have been in the low 90s.  He using his nebulizer routinely and using his inhalers regularly.  He uses his PLB to help.  He is somewhat compliant with his CPAP but does not like it and finds that it will spook him awake sometimes. John [name] is doing well in rehab. He has started to use his breathing treatments more in the morning when he has a hard time breathing and it helps.  He is compliant with using his oxygen .  He continues to use PLB routinely and sats are in low 90s upper 80s.  Minimal movement still wears him out.  He has not been doing breathing exercises or flutter valve.  He was encourged to work on his insipratometer more.   Goals/Expected Outcomes Short: Continue to attend rehab. Long: Use PLB to help with SOB. Short: Continue to attend rehab. Long: Use PLB to help with SOB. Short: Continue to use PLB routinely Long: Use nebulizer more consistently Short: Continue to use treatments Long: Continue to work on pursed lip breathing Short: Treatments daily Long; Do breathing exercises.    Row Name 08/11/24 1153             Program Oxygen  Prescription   Program Oxygen  Prescription None       Liters per minute 3         Home Oxygen    Home Oxygen  Device Home Concentrator;E-Tanks;Portable Concentrator       Sleep Oxygen  Prescription Continuous       Liters per minute 3       Home Exercise Oxygen  Prescription Continuous       Liters per minute 3       Home Resting Oxygen  Prescription Continuous       Liters per minute 3       Compliance with Home Oxygen  Use Yes         Goals/Expected Outcomes   Short Term Goals To learn and exhibit compliance with exercise, home and travel O2 prescription;To learn and  understand importance of monitoring SPO2 with pulse oximeter and demonstrate accurate use of the pulse oximeter.;To learn and understand importance of maintaining oxygen  saturations>88%;To learn and demonstrate proper pursed lip breathing techniques or other breathing techniques. ;To learn and demonstrate proper use of respiratory medications       Long  Term Goals Exhibits compliance with exercise, home  and travel O2 prescription;Maintenance of O2 saturations>88%;Compliance with respiratory medication;Verbalizes importance of monitoring SPO2 with pulse oximeter and return demonstration;Exhibits proper breathing techniques, such as pursed lip breathing or other method taught during program session;Demonstrates proper use of MDI's       Comments John [name] is nearing graduation.  He has been compliant with his oxygen , but not his breathing exercises.  He was encouraged to do his exercises more to improve/maintain.  he is using his nebulizer more!!  His saturations have been good excpet when transitioning between sitting and standing.  Any standing wears him and his breathing out.       Goals/Expected Outcomes Short: daily breathing exercises Long; conitnue to monitor saturations          Oxygen  Discharge (Final Oxygen  Re-Evaluation):  Oxygen  Re-Evaluation - 08/11/24 1153       Program Oxygen  Prescription   Program Oxygen  Prescription None    Liters per minute 3      Home Oxygen    Home Oxygen  Device Home Concentrator;E-Tanks;Portable Concentrator    Sleep Oxygen  Prescription Continuous    Liters per minute 3    Home Exercise Oxygen  Prescription Continuous    Liters per minute 3    Home Resting Oxygen  Prescription Continuous    Liters per minute 3    Compliance with Home Oxygen  Use Yes      Goals/Expected Outcomes   Short Term Goals To learn and exhibit compliance with exercise, home and travel O2 prescription;To learn and understand importance of monitoring SPO2 with pulse oximeter and demonstrate  accurate use of the pulse oximeter.;To learn and understand importance of maintaining oxygen  saturations>88%;To learn and demonstrate proper pursed lip breathing techniques or other breathing techniques. ;To learn and demonstrate proper use of respiratory medications    Long  Term Goals Exhibits compliance  with exercise, home  and travel O2 prescription;Maintenance of O2 saturations>88%;Compliance with respiratory medication;Verbalizes importance of monitoring SPO2 with pulse oximeter and return demonstration;Exhibits proper breathing techniques, such as pursed lip breathing or other method taught during program session;Demonstrates proper use of MDI's    Comments John [name] is nearing graduation.  He has been compliant with his oxygen , but not his breathing exercises.  He was encouraged to do his exercises more to improve/maintain.  he is using his nebulizer more!!  His saturations have been good excpet when transitioning between sitting and standing.  Any standing wears him and his breathing out.    Goals/Expected Outcomes Short: daily breathing exercises Long; conitnue to monitor saturations          Initial Exercise Prescription:   Perform Capillary Blood Glucose checks as needed.  Exercise Prescription Changes:   Exercise Prescription Changes     Row Name 03/29/24 1500 04/12/24 1300 05/07/24 0800 06/30/24 1300 07/21/24 1200     Response to Exercise   Blood Pressure (Admit) 126/64 130/68 140/60 138/70 132/64   Blood Pressure (Exit) 108/64 136/62 119/76 118/70 110/66   Heart Rate (Admit) 121 bpm 115 bpm 113 bpm 114 bpm 108 bpm   Heart Rate (Exercise) 109 bpm 121 bpm 116 bpm 120 bpm 120 bpm   Heart Rate (Exit) 111 bpm 109 bpm 109 bpm 72 bpm 110 bpm   Oxygen  Saturation (Admit) 91 % 90 % 93 % 88 % 91 %   Oxygen  Saturation (Exercise) 93 % 90 % 96 % 93 % 92 %   Oxygen  Saturation (Exit) 95 % 92 % 97 % 93 % 94 %   Rating of Perceived Exertion (Exercise) 13 14 15 15 14    Perceived Dyspnea  (Exercise) 3 2 2 3 3    Duration Continue with 30 min of aerobic exercise without signs/symptoms of physical distress. Continue with 30 min of aerobic exercise without signs/symptoms of physical distress. Continue with 30 min of aerobic exercise without signs/symptoms of physical distress. Continue with 30 min of aerobic exercise without signs/symptoms of physical distress. Continue with 30 min of aerobic exercise without signs/symptoms of physical distress.   Intensity THRR unchanged THRR unchanged THRR unchanged THRR unchanged THRR unchanged     Progression   Progression Continue to progress workloads to maintain intensity without signs/symptoms of physical distress. Continue to progress workloads to maintain intensity without signs/symptoms of physical distress. Continue to progress workloads to maintain intensity without signs/symptoms of physical distress. Continue to progress workloads to maintain intensity without signs/symptoms of physical distress. Continue to progress workloads to maintain intensity without signs/symptoms of physical distress.     Resistance Training   Training Prescription Yes Yes Yes Yes Yes   Weight 3 3 3 3 3    Reps 10-15 10-15 10-15 10-15 10-15     Oxygen    Oxygen  Continuous Continuous Continuous Continuous Continuous   Liters 4 3 3 3 3      NuStep   Level 2 2 2 2 2    SPM 43 52 50 59 49   Minutes 30 30 30 30 30    METs 1.5 1.6 1.7 1.7 1.6     Oxygen    Maintain Oxygen  Saturation 88% or higher 88% or higher 88% or higher 88% or higher 88% or higher    Row Name 07/28/24 1200 08/16/24 1000           Response to Exercise   Blood Pressure (Admit) 122/60 128/70      Blood Pressure (Exit) 132/60  122/62      Heart Rate (Admit) 104 bpm 105 bpm      Heart Rate (Exercise) 113 bpm 111 bpm      Heart Rate (Exit) 116 bpm 106 bpm      Oxygen  Saturation (Admit) 91 % 90 %      Oxygen  Saturation (Exercise) 92 % 95 %      Oxygen  Saturation (Exit) 91 % 94 %      Rating  of Perceived Exertion (Exercise) 15 15      Perceived Dyspnea (Exercise) 3 3      Duration Continue with 30 min of aerobic exercise without signs/symptoms of physical distress. Continue with 45 min of aerobic exercise without signs/symptoms of physical distress.      Intensity THRR unchanged THRR unchanged        Progression   Progression Continue to progress workloads to maintain intensity without signs/symptoms of physical distress. Continue to progress workloads to maintain intensity without signs/symptoms of physical distress.        Resistance Training   Training Prescription Yes Yes      Weight 3 3      Reps 10-15 10-15        Oxygen    Oxygen  Continuous Continuous      Liters 3 3        NuStep   Level 2 2      SPM 50 55      Minutes 30 30      METs 1.7 1.7        Oxygen    Maintain Oxygen  Saturation 88% or higher 88% or higher         Exercise Comments:   Exercise Comments     Row Name 08/30/24 9076           Exercise Comments John [name] graduated today from  rehab with 36 sessions completed.  Details of the patient's exercise prescription and what He needs to do in order to continue the prescription and progress were discussed with patient.  Patient was given a copy of prescription and goals.  Patient verbalized understanding. John [name] plans to continue to exercise by thinking about joining our maintenance program.          Exercise Goals and Review:   Exercise Goals Re-Evaluation :  Exercise Goals Re-Evaluation     Row Name 03/03/24 1151 03/29/24 0935 04/12/24 1142 05/19/24 1007 06/07/24 0942     Exercise Goal Re-Evaluation   Exercise Goals Review Increase Physical Activity;Increase Strength and Stamina;Understanding of Exercise Prescription Increase Strength and Stamina;Increase Physical Activity;Able to understand and use rate of perceived exertion (RPE) scale;Able to understand and use Dyspnea scale;Understanding of Exercise Prescription Increase Physical  Activity;Increase Strength and Stamina;Understanding of Exercise Prescription Increase Physical Activity;Increase Strength and Stamina;Understanding of Exercise Prescription Increase Physical Activity;Increase Strength and Stamina;Understanding of Exercise Prescription   Comments John [name] is doing well in rehab. He has not increased his level on the nustep but his SPM are increasing slightly. He is exercsing at a RPE of 13. He stated that he does feel a difference in his energy levels after exercise. We talking about energy saviing techniques when doing activities such as grocery shopping. John [name] is doing well in rehab. He states he does not do any exercise at home that this is his only exercise regimen. He is slowly increasing his SPM on the Nustep. He does have to take breaks often due to SOB. John [name] in doing well in rehab. He is deconditioned but has  been improving in rehab. He continues to work on his SPM . He gets very winded during exercise and focuses on hos PLB. Will continue to montior and progress as able John [name] has been doing well in rehab. He is still no doing much at home and was encouraged to move more at home especially on days he feels better.  He is limited by his breathing and fatigue.  We talked about not sitting as much as it does nothing to build his strength and stamina.  He was open to trying to move more. John [name] is back today after colonscopy and CT scans last week.  He was encouraged to continue to exercise to John [name] his stamina.  He is not doing much on his off days currently as he is not breathing as well.   Expected Outcomes Short: practice energy techniques   long term: continue to attend rehab. Short: Continue to attend rehab to increase strength and stamina. Long: Incorporate exercise at home. Short: Continue to attend rehab to increase strength and stamina. Long: Incorporate exercise at home. Short; Move more at home Long; Continue to improve strength Short: Move more on good days Long:  Continue to exercise    Row Name 07/19/24 0940 08/11/24 1141           Exercise Goal Re-Evaluation   Exercise Goals Review Increase Physical Activity;Increase Strength and Stamina;Understanding of Exercise Prescription Increase Physical Activity;Increase Strength and Stamina;Understanding of Exercise Prescription      Comments John [name] is still not doing much as home.  We talked about prepping for .  We also discussed thinking about maintenance for after graduation in order to keep exercising. John [name] improved his post by 161ft!!  He will be graduating soon and still not doing much at home.  We have convinced him to give maintenance a try to keep exercising.  He has committeed to trying it out for a month.      Expected Outcomes Short: Start to move more at home Long: Think about maintenance for after graduation Short; graduate Long; Continue to exercise to maintain gains         Discharge Exercise Prescription (Final Exercise Prescription Changes):  Exercise Prescription Changes - 08/16/24 1000       Response to Exercise   Blood Pressure (Admit) 128/70    Blood Pressure (Exit) 122/62    Heart Rate (Admit) 105 bpm    Heart Rate (Exercise) 111 bpm    Heart Rate (Exit) 106 bpm    Oxygen  Saturation (Admit) 90 %    Oxygen  Saturation (Exercise) 95 %    Oxygen  Saturation (Exit) 94 %    Rating of Perceived Exertion (Exercise) 15    Perceived Dyspnea (Exercise) 3    Duration Continue with 45 min of aerobic exercise without signs/symptoms of physical distress.    Intensity THRR unchanged      Progression   Progression Continue to progress workloads to maintain intensity without signs/symptoms of physical distress.      Resistance Training   Training Prescription Yes    Weight 3    Reps 10-15      Oxygen    Oxygen  Continuous    Liters 3      NuStep   Level 2    SPM 55    Minutes 30    METs 1.7      Oxygen    Maintain Oxygen  Saturation 88% or higher          Nutrition:   Target Goals:  Understanding of nutrition guidelines, daily intake of sodium 1500mg , cholesterol 200mg , calories 30% from fat and 7% or less from saturated fats, daily to have 5 or more servings of fruits and vegetables.  Education: Nutrition 1 -Group instruction provided by verbal, written material, interactive activities, discussions, models, and posters to present general guidelines for heart healthy nutrition including macronutrients, label reading, and promoting whole foods over processed counterparts. Education serves as pensions consultant of discussion of heart healthy eating for all. Written material provided at class time.     Education: Nutrition 2 -Group instruction provided by verbal, written material, interactive activities, discussions, models, and posters to present general guidelines for heart healthy nutrition including sodium, cholesterol, and saturated fat. Providing guidance of habit forming to improve blood pressure, cholesterol, and body weight. Written material provided at class time.     Biometrics:   Post Biometrics - 08/11/24 1140        Post  Biometrics   Height 5' 9 (1.753 m)    Weight 66.4 kg    Waist Circumference 34 inches    Hip Circumference 37.5 inches    Waist to Hip Ratio 0.91 %    BMI (Calculated) 21.59    Grip Strength 38.6 kg    Single Leg Stand 8.2 seconds          Nutrition Therapy Plan and Nutrition Goals:   Nutrition Assessments:  MEDIFICTS Score Key: >=70 Need to make dietary changes  40-70 Heart Healthy Diet <= 40 Therapeutic Level Cholesterol Diet  Flowsheet Row PULMONARY REHAB CHRONIC OBSTRUCTIVE PULMONARY DISEASE from 08/11/2024 in [name]  Picture Your Plate Total Score on Admission 50  Picture Your Plate Total Score on Discharge 48   Picture Your Plate Scores: <59 Unhealthy dietary pattern with much room for improvement. 41-50 Dietary pattern unlikely to meet recommendations for good health and  room for improvement. 51-60 More healthful dietary pattern, with some room for improvement.  >60 Healthy dietary pattern, although there may be some specific behaviors that could be improved.   Nutrition Goals Re-Evaluation:  Nutrition Goals Re-Evaluation     Row Name 03/03/24 1321 03/29/24 0932 04/12/24 1147 05/19/24 1011 06/07/24 0957     Goals   Nutrition Goal Healthy eating Healthy eating. Healthy eating. Short: increase water intake. Long: try to choose healthy options for meals. Short; Continue to try to drink more water Long: Continue to focus on eating around the clock   Comment John [name] is doing well in rehab. He stated that he is eating wha he likes to eat and really only eats about two meals a day. He does not drink a lot of water and we talked about increasing his water intake. He does drink diet sodas throughtout the day. John [name] states he is trying to eat healthy for the most part, he doesn't drink a lot of water, mostly tea. States he feels he is staying hydrated enough during the heat. Encouraged to drink more water. John [name] is doing well in rehab. He states that he is trying to eat healthy and pick healthier options for meals. He continues to not drink much water but does drink sweet tea throughout the day. He has cut back on sodas. He was encourged to drink more water throughtout the day. John [name] is still not drinking enough water.  We talked about how more water will help with his fluid balance and breathing some. He is also trying to add in some more protein. He eats when he is hungry but  still not eating a lot. John [name] trying to eat some. He was out with colonoscopy.  He is still drinking ensure every day and working on trying to gain weight.  He is now being worked up for colon cancer and was encouraged to make sure he continues to eat.   Expected Outcome Short: increase water intake long term: try to choose healthy options for meals Short: increase water intake. Long: try to choose healthy options  for meals. Short: increase water intake. Long: try to choose healthy options for meals. Short; Continue to try to drink more water Long: Continue to focus on eating around the clock Short: Continue to drink Ensure for nutrition Long: Continue to drink more    Row Name 07/19/24 0932 08/11/24 1148           Goals   Nutrition Goal Short: Continue to drink Ensure for nutrition Long: Continue to drink more Short; Increase protein Long: Continue to eat      Comment John [name] is still not hungry.  He makes himself eat.  He is drinking his ensure in the morning with his meds.  He does have PB sandwich each day.  They went out for Mexican last night.  We talked about making sure that he gets enough protein to avoid caexia effects. John [name] is still not always hungry but usually prefers beef and pork when he does eat.  he does not eat much fruits and vegetables.  Reminded him about the caexia effects and to supplement with Boost or Ensure on days he does not want to eat.      Expected Outcome Short; Increase protein Long: Continue to eat Continue to eat and increase protein         Nutrition Goals Discharge (Final Nutrition Goals Re-Evaluation):  Nutrition Goals Re-Evaluation - 08/11/24 1148       Goals   Nutrition Goal Short; Increase protein Long: Continue to eat    Comment John [name] is still not always hungry but usually prefers beef and pork when he does eat.  he does not eat much fruits and vegetables.  Reminded him about the caexia effects and to supplement with Boost or Ensure on days he does not want to eat.    Expected Outcome Continue to eat and increase protein          Psychosocial: Target Goals: Acknowledge presence or absence of significant depression and/or stress, maximize coping skills, provide positive support system. Participant is able to verbalize types and ability to use techniques and skills needed for reducing stress and depression.   Education: Stress, Anxiety, and Depression - Group  verbal and visual presentation to define topics covered.  Reviews how body is impacted by stress, anxiety, and depression.  Also discusses healthy ways to reduce stress and to treat/manage anxiety and depression.  Written material provided at class time. Flowsheet Row PULMONARY REHAB CHRONIC OBSTRUCTIVE PULMONARY DISEASE from 08/11/2024 in [facility]  Date 08/11/24  Educator jh  Instruction Review Code 1- Verbalizes Understanding    Education: Sleep Hygiene -Provides group verbal and written instruction about how sleep can affect your health.  Define sleep hygiene, discuss sleep cycles and impact of sleep habits. Review good sleep hygiene tips.    Initial Review & Psychosocial Screening:   Quality of Life Scores:  Scores of 19 and below usually indicate a poorer quality of life in these areas.  A difference of  2-3 points is a clinically meaningful difference.  A difference of 2-3  points in the total score of the Quality of Life Index has been associated with significant improvement in overall quality of life, self-image, physical symptoms, and general health in studies assessing change in quality of life.  PHQ-9: Review Flowsheet  More data exists      08/11/2024 06/22/2024 01/30/2024 09/10/2023 04/16/2023  Depression screen PHQ 2/9  Decreased Interest 1 0 0 0 0  Down, Depressed, Hopeless 0 0 0 0 0  PHQ - 2 Score 1 0 0 0 0  Altered sleeping 3 - 0 0 -  Tired, decreased energy 2 - 2 0 -  Change in appetite 2 - 1 0 -  Feeling bad or failure about yourself  0 - 0 0 -  Trouble concentrating 0 - 0 0 -  Moving slowly or fidgety/restless 0 - 0 0 -  Suicidal thoughts 0 - 0 0 -  PHQ-9 Score 8 - 3  0  -  Difficult doing work/chores Somewhat difficult - Not difficult at all Not difficult at all -    Details       Data saved with a previous flowsheet row definition        Interpretation of Total Score  Total Score Depression Severity:  1-4 = Minimal depression,  5-9 = Mild depression, 10-14 = Moderate depression, 15-19 = Moderately severe depression, 20-27 = Severe depression   Psychosocial Evaluation and Intervention:   Psychosocial Re-Evaluation:  Psychosocial Re-Evaluation     Row Name 03/03/24 1258 03/29/24 0931 04/12/24 1144 05/19/24 1009 06/07/24 0952     Psychosocial Re-Evaluation   Current issues with None Identified None Identified Current Stress Concerns;Current Sleep Concerns Current Stress Concerns;Current Sleep Concerns Current Stress Concerns;Current Sleep Concerns   Comments John [name] is doing well in rehab. He stated that he does not have very many issues with his sleep. He does get up during the night to use the restroom but is able to get back to sleep. He also stated that he does not have much stress in his life. He has his everyday worrying about his breathing but other then that he isnt very stressed. John [name] is doing well in rehab. He identifies no stressors in his life. He states he is sleeping ok, he is a very light sleeper so he will wake up easily during the night. John [name] is doing well in rehab. He stated that he get 4 hours of sleep and does okay. He is not napping during the day. He recently had a PET scan done of his intestines and said that they found something but was unsure. He is not really worried or stressed John [name] is doing well in rehab.  He still does not sleep well and usually up at 3am each day. He was encouraged to try to nap in his recliner.  He notes that he gets clogged when sleeping on side.  He is worried about his colonoscopy next week to get better look at mass in intestines.  He is really worried about whether or not he will tolerate procedure and wake up after.  He was encouraged to let us  know how it goes and we talked about it being better to be aware of what is going on for him. John [name] was just diagnosed with colon cancer and is awaiting to meet with oncology and surgeon.  He is over whelmed with the news and trying to get  all the information he can.  We talked through some of the alternatives and he was encouraged to meet with  doctors to discuss prognosis and options.  His cardiologist was encouraging as well.  He is still not sleeping well, and the new news is also weighing on him.  He is meeting with his pulmonologist this week.  He is praying for guideance   Expected Outcomes Short: continue to have outlets for stress   long : continue to exericse for ovr all stress relef Short: Continue to attend rehab. Long:Continue to maintain a stress free lifestyle. Short: Continue to attend rehab. Long:Continue to maintain a stress free lifestyle. Short; Get through colonoscopy Long: Continue to work on sleep Short: Get information to make decision Long: Continue to cope with new decisions   Interventions Encouraged to attend Pulmonary Rehabilitation for the exercise Encouraged to attend Pulmonary Rehabilitation for the exercise Encouraged to attend Pulmonary Rehabilitation for the exercise Encouraged to attend Pulmonary Rehabilitation for the exercise Encouraged to attend Pulmonary Rehabilitation for the exercise   Continue Psychosocial Services  Follow up required by staff Follow up required by staff Follow up required by staff Follow up required by staff Follow up required by staff    Row Name 07/19/24 0928 08/11/24 1145           Psychosocial Re-Evaluation   Current issues with Current Stress Concerns;Current Sleep Concerns Current Stress Concerns;Current Sleep Concerns      Comments John [name] has decided to forgo treatment and live life as much as he can.  He wants to enjoy his grandkids and make the most out of things that he is able.  He is at peace with his decision.  He talked to his kids and they are worried about him, but are coming to terms with his decision.  He has talked it over with his doctors too.  He is still not sleeping the best, but will sometimes lays awake. John [name] is still not sleeping the best, but does what he  can and continues to wake around 3 am each day. He is trying to stay positive despite the cancer diagnosis.  His kids are starting to come around to his choices and immenent death.  He is taking each day as it comes and plans to continue.  We talked about how exercise is important for both his strength and mental health.      Expected Outcomes Short; Continue to make peace with decision  Long; Conitnue to live as much as possible. Short: Continue to stay positive Long: Continue to cope and sleep when can.      Interventions Encouraged to attend Pulmonary Rehabilitation for the exercise;Stress management education Encouraged to attend Pulmonary Rehabilitation for the exercise      Continue Psychosocial Services  Follow up required by staff Follow up required by staff         Psychosocial Discharge (Final Psychosocial Re-Evaluation):  Psychosocial Re-Evaluation - 08/11/24 1145       Psychosocial Re-Evaluation   Current issues with Current Stress Concerns;Current Sleep Concerns    Comments John [name] is still not sleeping the best, but does what he can and continues to wake around 3 am each day. He is trying to stay positive despite the cancer diagnosis.  His kids are starting to come around to his choices and immenent death.  He is taking each day as it comes and plans to continue.  We talked about how exercise is important for both his strength and mental health.    Expected Outcomes Short: Continue to stay positive Long: Continue to cope and sleep when can.  Interventions Encouraged to attend Pulmonary Rehabilitation for the exercise    Continue Psychosocial Services  Follow up required by staff          Education: Education Goals: Education classes will be provided on a weekly basis, covering required topics. Participant will state understanding/return demonstration of topics presented.  Learning Barriers/Preferences:   General Pulmonary Education Topics:  Infection Prevention: - Provides  verbal and written material to individual with discussion of infection control including proper hand washing and proper equipment cleaning during exercise session.   Falls Prevention: - Provides verbal and written material to individual with discussion of falls prevention and safety.   Chronic Lung Disease Review: - Group verbal instruction with posters, models, PowerPoint presentations and videos,  to review new updates, new respiratory medications, new advancements in procedures and treatments. Providing information on websites and 800 numbers for continued self-education. Includes information about supplement oxygen , available portable oxygen  systems, continuous and intermittent flow rates, oxygen  safety, concentrators, and Medicare reimbursement for oxygen . Explanation of Pulmonary Drugs, including class, frequency, complications, importance of spacers, rinsing mouth after steroid MDI's, and proper cleaning methods for nebulizers. Review of basic lung anatomy and physiology related to function, structure, and complications of lung disease. Review of risk factors. Discussion about methods for diagnosing sleep apnea and types of masks and machines for OSA. Includes a review of the use of types of environmental controls: home humidity, furnaces, filters, dust mite/pet prevention, HEPA vacuums. Discussion about weather changes, air quality and the benefits of nasal washing. Instruction on Warning signs, infection symptoms, calling MD promptly, preventive modes, and value of vaccinations. Review of effective airway clearance, coughing and/or vibration techniques. Emphasizing that all should Create an Action Plan. Written material provided at class time.   AED/CPR: - Group verbal and written instruction with the use of models to demonstrate the basic use of the AED with the basic ABC's of resuscitation.    Tests and Procedures:  - Group verbal and visual presentation and models provide information  about basic cardiac anatomy and function. Reviews the testing methods done to diagnose heart disease and the outcomes of the test results. Describes the treatment choices: Medical Management, Angioplasty, or Coronary Bypass Surgery for treating various heart conditions including Myocardial Infarction, Angina, Valve Disease, and Cardiac Arrhythmias.  Written material provided at class time. Flowsheet Row PULMONARY REHAB CHRONIC OBSTRUCTIVE PULMONARY DISEASE from 08/11/2024 in [facility]  Date 05/12/24  Educator DJ  Instruction Review Code 1- Verbalizes Understanding    Medication Safety: - Group verbal and visual instruction to review commonly prescribed medications for heart and lung disease. Reviews the medication, class of the drug, and side effects. Includes the steps to properly store meds and maintain the prescription regimen.  Written material given at graduation.   Other: -Provides group and verbal instruction on various topics (see comments)   Knowledge Questionnaire Score:  Knowledge Questionnaire Score - 08/11/24 1157       Knowledge Questionnaire Score   Pre Score 15/18    Post Score 17/18           Core Components/Risk Factors/Patient Goals at Admission:   Education:Diabetes - Individual verbal and written instruction to review signs/symptoms of diabetes, desired ranges of glucose level fasting, after meals and with exercise. Acknowledge that pre and post exercise glucose checks will be done for 3 sessions at entry of program.   Know Your Numbers and Heart Failure: - Group verbal and visual instruction to discuss disease risk factors for  cardiac and pulmonary disease and treatment options.  Reviews associated critical values for Overweight/Obesity, Hypertension, Cholesterol, and Diabetes.  Discusses basics of heart failure: signs/symptoms and treatments.  Introduces Heart Failure Zone chart for action plan for heart failure. Written material  provided at class time.   Core Components/Risk Factors/Patient Goals Review:   Goals and Risk Factor Review     Row Name 03/03/24 1324 03/29/24 0933 04/12/24 1150 05/19/24 1013 06/07/24 1027     Core Components/Risk Factors/Patient Goals Review   Personal Goals Review Weight Management/Obesity;Stress;Improve shortness of breath with ADL's Weight Management/Obesity;Improve shortness of breath with ADL's;Develop more efficient breathing techniques such as purse lipped breathing and diaphragmatic breathing and practicing self-pacing with activity. Weight Management/Obesity;Improve shortness of breath with ADL's;Increase knowledge of respiratory medications and ability to use respiratory devices properly.;Develop more efficient breathing techniques such as purse lipped breathing and diaphragmatic breathing and practicing self-pacing with activity. Weight Management/Obesity;Improve shortness of breath with ADL's;Increase knowledge of respiratory medications and ability to use respiratory devices properly.;Develop more efficient breathing techniques such as purse lipped breathing and diaphragmatic breathing and practicing self-pacing with activity. Weight Management/Obesity;Improve shortness of breath with ADL's;Increase knowledge of respiratory medications and ability to use respiratory devices properly.;Develop more efficient breathing techniques such as purse lipped breathing and diaphragmatic breathing and practicing self-pacing with activity.   Review John [name] is doing well in rehab. He John [name] is doing well in rehab. He takes all his medications as prescribed. He checks his O2 at home and it often runs low like 85% when he has walked just a short distance. He doesn't check his BP at home, which is really hasn't been a problem for him here at rehab. John [name] is doing well in rehab. He is taking all medications as prescribed and continues to check his O2 stats outside of class. He is focusing on PLB when he gets SOB  and when his oxygen  drops. He does not check his BP at home, his BP has not been an issue during rehab but it is encourged to check at home to know paitents resting numbers John [name] is doing well in rehab.  He is gaining some weight.  He has been working on his breathing.  He still gets SOB easily and it is his biggest limitation.  He is working on PLB and finds it helpful.  He does note when he breathes through his mouth is upsets his stomach as he swallows air more then.  He was encouraged to use his nebulizer more regularly and extra when he needs it versus just when he needs it. John [name] has just been diagnosed colon cancer.  His weight is down from not eating after his news.  He still has good days and bad days with breathing.  He does not like his CPAP machine and sleeps in chair with it.  He is good about using his breathing treatments.   Expected Outcomes -- Short: Continue to check O2 saturation at home. Long: Report abnormalities to all healthcare professionals. Short: Continue to check O2 saturation at home. Long: Report abnormalities to all healthcare professionals. Short: Use nebulizer daily Long: Continue to work on using PLB Short; Find out more about his colon cancer Long: Continue use breathing treatments.    Row Name 07/19/24 0935 08/11/24 1150           Core Components/Risk Factors/Patient Goals Review   Personal Goals Review Weight Management/Obesity;Improve shortness of breath with ADL's;Increase knowledge of respiratory medications and ability to use respiratory devices properly.;Develop more  efficient breathing techniques such as purse lipped breathing and diaphragmatic breathing and practicing self-pacing with activity. Weight Management/Obesity;Improve shortness of breath with ADL's;Increase knowledge of respiratory medications and ability to use respiratory devices properly.;Develop more efficient breathing techniques such as purse lipped breathing and diaphragmatic breathing and  practicing self-pacing with activity.      Review John [name] is not going to do anything about his cancer.  His weight is holding steady.  He really wants to gain weight.  He is compliant with his oxygen  and working on doing more. His breathing is his biggest limitation.  He is good about using his PLB and doing treatments now.  We talked about making sure he eats protein daily. John [name] is nearing graduation.  He has done well in program. His weight has continued to stay steady despite not always eating.  he is breathing better with PLB but is back to using nebulizer.  His pressures are all over the place but he continues to keep an eye on it.  He is going to do maintenance for at least a month to continue to maintain. he is not doing his breathing exercises and we talked about why they are necessary to maintain.      Expected Outcomes Short: Add in more breathing exercises and protein Long; COnitnue to work on breathing Short: Add back in breathing exercises Long: Continue to maintain weight         Core Components/Risk Factors/Patient Goals at Discharge (Final Review):   Goals and Risk Factor Review - 08/11/24 1150       Core Components/Risk Factors/Patient Goals Review   Personal Goals Review Weight Management/Obesity;Improve shortness of breath with ADL's;Increase knowledge of respiratory medications and ability to use respiratory devices properly.;Develop more efficient breathing techniques such as purse lipped breathing and diaphragmatic breathing and practicing self-pacing with activity.    Review John [name] is nearing graduation.  He has done well in program. His weight has continued to stay steady despite not always eating.  he is breathing better with PLB but is back to using nebulizer.  His pressures are all over the place but he continues to keep an eye on it.  He is going to do maintenance for at least a month to continue to maintain. he is not doing his breathing exercises and we talked about why they are  necessary to maintain.    Expected Outcomes Short: Add back in breathing exercises Long: Continue to maintain weight          ITP Comments:  ITP Comments     Row Name 03/10/24 0754 04/05/24 0938 04/07/24 0830 05/05/24 1002 06/02/24 1426   ITP Comments 30 day review completed. ITP sent to Dr.Jehanzeb Memon, Medical Director of Pulmonary Rehab. Continue with ITP unless changes are made by physician. Called to check on patient when he did not show for rehab today.  Daril was having a bad breathing day and did not feel up to coming to rehab today.  He hopes to return on Wednesday and was encouraged to use his nebulizer an extra time today. 30 day review completed. ITP sent to Dr.Jehanzeb Memon, Medical Director of  Pulmonary Rehab. Continue with ITP unless changes are made by physician. 30 day review completed. ITP sent to Dr.Jehanzeb Memon, Medical Director of  Pulmonary Rehab. Continue with ITP unless changes are made by physician. 30 day review completed. ITP sent to Dr.Jehanzeb Memon, Medical Director of  Pulmonary Rehab. Continue with ITP unless changes are made by physician. Has  been out this week with appts.    Row Name 06/30/24 1012 07/28/24 0806 08/24/24 0927 08/30/24 0923     ITP Comments 30 day review completed. ITP sent to Dr.Jehanzeb Memon, Medical Director of  Pulmonary Rehab. Continue with ITP unless changes are made by physician. 30 day review completed. ITP sent to Dr.Jehanzeb Memon, Medical Director of  Pulmonary Rehab. Continue with ITP unless changes are made by physician. 30 day review completed. ITP sent to Dr.Jehanzeb Memon, Medical Director of  Pulmonary Rehab. Continue with ITP unless changes are made by physician. Still has one more visit to finish program., Rithy graduated today from  rehab with 36 sessions completed.  Details of the patient's exercise prescription and what He needs to do in order to continue the prescription and progress were discussed with patient.  Patient  was given a copy of prescription and goals.  Patient verbalized understanding. Trindon plans to continue to exercise by thinking about joining our maintenance program.       Comments: Discharge ITP.

## 2024-08-30 NOTE — Progress Notes (Signed)
 Discharge Progress Report  Patient Details  Name: John [name] MRN: [id number] Date of Birth: Aug 30, 1956 Referring Provider:   Flowsheet Row PULMONARY REHAB COPD ORIENTATION from 02/02/2024 in [facility] CARDIAC REHABILITATION  Referring Provider [name] MD     Number of Visits: 36  Reason for Discharge:  Patient reached a stable level of exercise. Patient independent in their exercise. Patient has met program and personal goals.  Smoking History:  Social History   Tobacco Use  Smoking Status Former   Current packs/day: 0.00   Average packs/day: 2.0 packs/day for 51.6 years (103.1 ttl pk-yrs)   Types: Cigarettes   Start date: 16   Quit date: 04/18/2020   Years since quitting: 4.3  Smokeless Tobacco Never  Tobacco Comments   2 plus packs per day. He has a 100 + pack - year history of tobacco abuse currently. Former 4 ppd for 25 years.    Diagnosis:  Chronic bronchitis with COPD (chronic obstructive pulmonary disease) (HCC)  ADL [facility]:  Pulmonary Assessment Scores     Row Name 08/11/24 1156         ADL [facility]   ADL Phase Exit     SOB Score total 68     Rest 0     Walk 2     Stairs 3     Bath 4     Dress 2     Shop 4       CAT Score   CAT Score 24       mMRC Score   mMRC Score 4        Initial Exercise Prescription:   Discharge Exercise Prescription (Final Exercise Prescription Changes):  Exercise Prescription Changes - 08/16/24 1000       Response to Exercise   Blood Pressure (Admit) 128/70    Blood Pressure (Exit) 122/62    Heart Rate (Admit) 105 bpm    Heart Rate (Exercise) 111 bpm    Heart Rate (Exit) 106 bpm    Oxygen  Saturation (Admit) 90 %    Oxygen  Saturation (Exercise) 95 %    Oxygen  Saturation (Exit) 94 %    Rating of Perceived Exertion (Exercise) 15    Perceived Dyspnea (Exercise) 3    Duration Continue with 45 min of aerobic exercise without signs/symptoms of physical distress.    Intensity THRR unchanged       Progression   Progression Continue to progress workloads to maintain intensity without signs/symptoms of physical distress.      Resistance Training   Training Prescription Yes    Weight 3    Reps 10-15      Oxygen    Oxygen  Continuous    Liters 3      NuStep   Level 2    SPM 55    Minutes 30    METs 1.7      Oxygen    Maintain Oxygen  Saturation 88% or higher          Functional Capacity:  6 Minute Walk     Row Name 08/11/24 1137         6 Minute Walk   Phase Discharge     Distance 400 feet     Distance % Change 33.3 %     Distance Feet Change 100 ft     Walk Time 3.25 minutes     # of Rest Breaks 3  53sec, 50 sec, stopped at 5:04     METS 2.28     RPE  17     Perceived Dyspnea  4     VO2 Peak 7.99     Symptoms Yes (comment)     Comments SOB     Resting HR 107 bpm     Resting BP 136/72     Resting Oxygen  Saturation  98 %     Exercise Oxygen  Saturation  during 6 min walk 86 %     Max Ex. HR 121 bpm     Max Ex. BP 146/82     2 Minute Post BP 122/62       Interval HR   1 Minute HR 118     2 Minute HR 116     3 Minute HR 114     4 Minute HR 120     5 Minute HR 116     6 Minute HR 121     2 Minute Post HR 114     Interval Heart Rate? Yes       Interval Oxygen    Interval Oxygen ? Yes     Baseline Oxygen  Saturation % 93 %     1 Minute Oxygen  Saturation % 98 %     1 Minute Liters of Oxygen  4 L  NCC     2 Minute Oxygen  Saturation % 89 %     2 Minute Liters of Oxygen  4 L     3 Minute Oxygen  Saturation % 90 %     3 Minute Liters of Oxygen  4 L     4 Minute Oxygen  Saturation % 90 %     4 Minute Liters of Oxygen  4 L     5 Minute Oxygen  Saturation % 88 %     5 Minute Liters of Oxygen  4 L     6 Minute Oxygen  Saturation % 86 %     6 Minute Liters of Oxygen  4 L     2 Minute Post Oxygen  Saturation % 89 %     2 Minute Post Liters of Oxygen  4 L        Psychological, QOL, Others - Outcomes: PHQ 2/9:    08/11/2024   11:58 AM 06/22/2024   11:01 AM 01/30/2024     9:16 AM 09/10/2023    1:11 PM 04/16/2023    1:09 PM  Depression screen PHQ 2/9  Decreased Interest 1 0 0 0 0  Down, Depressed, Hopeless 0 0 0 0 0  PHQ - 2 Score 1 0 0 0 0  Altered sleeping 3  0 0   Tired, decreased energy 2  2 0   Change in appetite 2  1 0   Feeling bad or failure about yourself  0  0 0   Trouble concentrating 0  0 0   Moving slowly or fidgety/restless 0  0 0   Suicidal thoughts 0  0 0   PHQ-9 Score 8  3  0    Difficult doing work/chores Somewhat difficult  Not difficult at all Not difficult at all      Data saved with a previous flowsheet row definition    Quality of Life:   Personal Goals: Goals established at orientation with interventions provided to work toward goal.    Personal Goals Discharge:  Goals and Risk Factor Review     Row Name 03/03/24 1324 03/29/24 0933 04/12/24 1150 05/19/24 1013 06/07/24 1027     Core Components/Risk Factors/Patient Goals Review   Personal Goals Review Weight Management/Obesity;Stress;Improve shortness of breath with ADL's Weight Management/Obesity;Improve shortness  of breath with ADL's;Develop more efficient breathing techniques such as purse lipped breathing and diaphragmatic breathing and practicing self-pacing with activity. Weight Management/Obesity;Improve shortness of breath with ADL's;Increase knowledge of respiratory medications and ability to use respiratory devices properly.;Develop more efficient breathing techniques such as purse lipped breathing and diaphragmatic breathing and practicing self-pacing with activity. Weight Management/Obesity;Improve shortness of breath with ADL's;Increase knowledge of respiratory medications and ability to use respiratory devices properly.;Develop more efficient breathing techniques such as purse lipped breathing and diaphragmatic breathing and practicing self-pacing with activity. Weight Management/Obesity;Improve shortness of breath with ADL's;Increase knowledge of respiratory  medications and ability to use respiratory devices properly.;Develop more efficient breathing techniques such as purse lipped breathing and diaphragmatic breathing and practicing self-pacing with activity.   Review Dick is doing well in rehab. He Dick is doing well in rehab. He takes all his medications as prescribed. He checks his O2 at home and it often runs low like 85% when he has walked just a short distance. He doesn't check his BP at home, which is really hasn't been a problem for him here at rehab. Dick is doing well in rehab. He is taking all medications as prescribed and continues to check his O2 stats outside of class. He is focusing on PLB when he gets SOB and when his oxygen  drops. He does not check his BP at home, his BP has not been an issue during rehab but it is encourged to check at home to know paitents resting numbers Dick is doing well in rehab.  He is gaining some weight.  He has been working on his breathing.  He still gets SOB easily and it is his biggest limitation.  He is working on PLB and finds it helpful.  He does note when he breathes through his mouth is upsets his stomach as he swallows air more then.  He was encouraged to use his nebulizer more regularly and extra when he needs it versus just when he needs it. Dick has just been diagnosed colon cancer.  His weight is down from not eating after his news.  He still has good days and bad days with breathing.  He does not like his CPAP machine and sleeps in chair with it.  He is good about using his breathing treatments.   Expected Outcomes -- Short: Continue to check O2 saturation at home. Long: Report abnormalities to all healthcare professionals. Short: Continue to check O2 saturation at home. Long: Report abnormalities to all healthcare professionals. Short: Use nebulizer daily Long: Continue to work on using PLB Short; Find out more about his colon cancer Long: Continue use breathing treatments.    Row Name 07/19/24 0935  08/11/24 1150           Core Components/Risk Factors/Patient Goals Review   Personal Goals Review Weight Management/Obesity;Improve shortness of breath with ADL's;Increase knowledge of respiratory medications and ability to use respiratory devices properly.;Develop more efficient breathing techniques such as purse lipped breathing and diaphragmatic breathing and practicing self-pacing with activity. Weight Management/Obesity;Improve shortness of breath with ADL's;Increase knowledge of respiratory medications and ability to use respiratory devices properly.;Develop more efficient breathing techniques such as purse lipped breathing and diaphragmatic breathing and practicing self-pacing with activity.      Review Dick is not going to do anything about his cancer.  His weight is holding steady.  He really wants to gain weight.  He is compliant with his oxygen  and working on doing more. His breathing is his biggest limitation.  He is good about using his PLB and doing treatments now.  We talked about making sure he eats protein daily. Dick is nearing graduation.  He has done well in program. His weight has continued to stay steady despite not always eating.  he is breathing better with PLB but is back to using nebulizer.  His pressures are all over the place but he continues to keep an eye on it.  He is going to do maintenance for at least a month to continue to maintain. he is not doing his breathing exercises and we talked about why they are necessary to maintain.      Expected Outcomes Short: Add in more breathing exercises and protein Long; COnitnue to work on breathing Short: Add back in breathing exercises Long: Continue to maintain weight         Exercise Goals and Review:   Exercise Goals Re-Evaluation:  Exercise Goals Re-Evaluation     Row Name 03/03/24 1151 03/29/24 0935 04/12/24 1142 05/19/24 1007 06/07/24 0942     Exercise Goal Re-Evaluation   Exercise Goals Review Increase Physical  Activity;Increase Strength and Stamina;Understanding of Exercise Prescription Increase Strength and Stamina;Increase Physical Activity;Able to understand and use rate of perceived exertion (RPE) scale;Able to understand and use Dyspnea scale;Understanding of Exercise Prescription Increase Physical Activity;Increase Strength and Stamina;Understanding of Exercise Prescription Increase Physical Activity;Increase Strength and Stamina;Understanding of Exercise Prescription Increase Physical Activity;Increase Strength and Stamina;Understanding of Exercise Prescription   Comments Dick is doing well in rehab. He has not increased his level on the nustep but his SPM are increasing slightly. He is exercsing at a RPE of 13. He stated that he does feel a difference in his energy levels after exercise. We talking about energy saviing techniques when doing activities such as grocery shopping. Dick is doing well in rehab. He states he does not do any exercise at home that this is his only exercise regimen. He is slowly increasing his SPM on the Nustep. He does have to take breaks often due to SOB. Dick in doing well in rehab. He is deconditioned but has been improving in rehab. He continues to work on his SPM . He gets very winded during exercise and focuses on hos PLB. Will continue to montior and progress as able Dick has been doing well in rehab. He is still no doing much at home and was encouraged to move more at home especially on days he feels better.  He is limited by his breathing and fatigue.  We talked about not sitting as much as it does nothing to build his strength and stamina.  He was open to trying to move more. Dick is back today after colonscopy and CT scans last week.  He was encouraged to continue to exercise to [facility] his stamina.  He is not doing much on his off days currently as he is not breathing as well.   Expected Outcomes Short: practice energy techniques   long term: continue to attend rehab. Short:  Continue to attend rehab to increase strength and stamina. Long: Incorporate exercise at home. Short: Continue to attend rehab to increase strength and stamina. Long: Incorporate exercise at home. Short; Move more at home Long; Continue to improve strength Short: Move more on good days Long: Continue to exercise    Row Name 07/19/24 0940 08/11/24 1141           Exercise Goal Re-Evaluation   Exercise Goals Review Increase Physical Activity;Increase Strength and Stamina;Understanding of Exercise  Prescription Increase Physical Activity;Increase Strength and Stamina;Understanding of Exercise Prescription      Comments Dick is still not doing much as home.  We talked about prepping for .  We also discussed thinking about maintenance for after graduation in order to keep exercising. Rick improved his post by 114ft!!  He will be graduating soon and still not doing much at home.  We have convinced him to give maintenance a try to keep exercising.  He has committeed to trying it out for a month.      Expected Outcomes Short: Start to move more at home Long: Think about maintenance for after graduation Short; graduate Long; Continue to exercise to maintain gains         Nutrition & Weight - Outcomes:   Post Biometrics - 08/11/24 1140        Post  Biometrics   Height 5' 9 (1.753 m)    Weight 66.4 kg    Waist Circumference 34 inches    Hip Circumference 37.5 inches    Waist to Hip Ratio 0.91 %    BMI (Calculated) 21.59    Grip Strength 38.6 kg    Single Leg Stand 8.2 seconds          Nutrition:   Nutrition Discharge:   Education Questionnaire Score:  Knowledge Questionnaire Score - 08/11/24 1157       Knowledge Questionnaire Score   Pre Score 15/18    Post Score 17/18          Goals reviewed with patient; copy given to patient.

## 2024-09-20 ENCOUNTER — Ambulatory Visit: Payer: Medicare Other

## 2024-09-20 VITALS — BP 130/87 | Ht 69.0 in | Wt 145.0 lb

## 2024-09-20 DIAGNOSIS — Z0000 Encounter for general adult medical examination without abnormal findings: Secondary | ICD-10-CM | POA: Diagnosis not present

## 2024-09-20 NOTE — Progress Notes (Signed)
 "  Patient diagnosed with colon cancer in September. Does not wish to proceed with treatment. Currently in pallative care due to end stage COPD  Patient voices no concerns at this time Chief Complaint  Patient presents with   Medicare Wellness     Subjective:   John [name] is a 68 y.o. male who presents for a Medicare Annual Wellness Visit.  Visit info / Clinical Intake: Persons participating in visit and providing information:: patient Medicare Wellness Visit Mode:: Telephone If telephone:: video error Since this visit was completed virtually, some vitals may be partially provided or unavailable. Missing vitals are due to the limitations of the virtual format.: Documented vitals are patient reported If Telephone or Video please confirm:: I connected with patient using audio/video enable telemedicine. I verified patient identity with two identifiers, discussed telehealth limitations, and patient agreed to proceed. Patient Location:: home Provider Location:: office Interpreter Needed?: No Pre-visit prep was completed: yes AWV questionnaire completed by patient prior to visit?: yes Date:: 09/13/24 Living arrangements:: (Patient-Rptd) lives with spouse/significant other Patient's Overall Health Status Rating: (Patient-Rptd) good Typical amount of pain: (Patient-Rptd) none Does pain affect daily life?: (Patient-Rptd) no Are you currently prescribed opioids?: no  Dietary Habits and Nutritional Risks How many meals a day?: (Patient-Rptd) 3 Eats fruit and vegetables daily?: (!) (Patient-Rptd) no Most meals are obtained by: (Patient-Rptd) having others provide food In the last 2 weeks, have you had any of the following?: none Diabetic:: no  Functional Status Activities of Daily Living (to include ambulation/medication): (!) (Patient-Rptd) Needs Assist Feeding: Independent Dressing/Grooming: Independent Bathing: Needs assistance Toileting: Independent Transfer:  Independent Ambulation: (Patient-Rptd) Independent Home Assistive Devices/Equipment: Oxygen ; Other (Comment) (scooter) Medication Administration: (Patient-Rptd) Needs assistance (comment) Home Management (perform basic housework or laundry): (Patient-Rptd) Needs assistance (comment) Manage your own finances?: (!) (Patient-Rptd) no Primary transportation is: (Patient-Rptd) driving Concerns about vision?: no *vision screening is required for WTM* Concerns about hearing?: no  Fall Screening Falls in the past year?: (Patient-Rptd) 0 Number of falls in past year: 0 Was there an injury with Fall?: 0 Fall Risk Category Calculator: 0 Patient Fall Risk Level: Low Fall Risk  Fall Risk Patient at Risk for Falls Due to: No Fall Risks Fall risk Follow up: Falls evaluation completed; Education provided; Falls prevention discussed  Home and Transportation Safety: All rugs have non-skid backing?: (!) (Patient-Rptd) no All stairs or steps have railings?: (Patient-Rptd) yes Grab bars in the bathtub or shower?: (!) (Patient-Rptd) no Have non-skid surface in bathtub or shower?: (!) (Patient-Rptd) no Good home lighting?: (Patient-Rptd) yes Regular seat belt use?: (Patient-Rptd) yes Hospital stays in the last year:: (Patient-Rptd) no  Cognitive Assessment Difficulty concentrating, remembering, or making decisions? : (Patient-Rptd) no Will 6CIT or Mini Cog be Completed: yes What year is it?: 0 points What month is it?: 0 points Give patient an address phrase to remember (5 components): [address] [place] About what time is it?: 0 points Count backwards from 20 to 1: 0 points Say the months of the year in reverse: 0 points Repeat the address phrase from earlier: 0 points 6 CIT Score: 0 points  Advance Directives (For Healthcare) Does Patient Have a Medical Advance Directive?: Yes Does patient want to make changes to medical advance directive?: No - Patient declined Type of Advance  Directive: Healthcare Power of [place]; Living will Copy of Healthcare Power of Attorney in Chart?: Yes - validated most recent copy scanned in chart (See row information) Copy of Living Will in Chart?:  Yes - validated most recent copy scanned in chart (See row information)  Reviewed/Updated  Reviewed/Updated: Reviewed All (Medical, Surgical, Family, Medications, Allergies, Care Teams, Patient Goals)    Allergies (verified) Penicillins   Current Medications (verified) Outpatient Encounter Medications as of 09/20/2024  Medication Sig   albuterol  (PROAIR  HFA) 108 (90 Base) MCG/ACT inhaler Inhale 2 puffs into the lungs every 4 (four) hours as needed for wheezing or shortness of breath. 2 puffs every 4 hours as needed only  if your can't catch your breath   albuterol  (PROVENTIL ) (2.5 MG/3ML) 0.083% nebulizer solution Take 3 mLs (2.5 mg total) by nebulization every 4 (four) hours as needed for wheezing or shortness of breath.   aspirin  EC 81 MG tablet Take 81 mg by mouth daily.   atorvastatin  (LIPITOR ) 40 MG tablet Take 1 tablet (40 mg total) by mouth daily.   budesonide -glycopyrrolate -formoterol  (BREZTRI  AEROSPHERE) 160-9-4.8 MCG/ACT AERO inhaler Inhale 2 puffs into the lungs in the morning and at bedtime.   furosemide  (LASIX ) 40 MG tablet Take 1 tablet (40 mg total) by mouth daily.   guaiFENesin  (MUCINEX ) 600 MG 12 hr tablet Take 2 tablets (1,200 mg total) by mouth 2 (two) times daily as needed for cough or to loosen phlegm.   Magnesium  250 MG CAPS Take by mouth daily.   Multiple Vitamin (MULTIVITAMIN WITH MINERALS) TABS tablet Take 1 tablet by mouth daily.   nitroGLYCERIN  (NITROSTAT ) 0.4 MG SL tablet Place 1 tablet (0.4 mg total) under the tongue every 5 (five) minutes as needed for chest pain.   OXYGEN  Inhale 2-3 L into the lungs.   sodium chloride  HYPERTONIC 3 % nebulizer solution Take by nebulization 2 (two) times daily as needed for other.   Spacer/Aero-Holding Raguel FRENCH Use twice  daily as directed   [DISCONTINUED] azithromycin  (ZITHROMAX ) 250 MG tablet Take two tabs x day 1, then 1 tab daily (Patient not taking: Reported on 09/20/2024)   [DISCONTINUED] doxycycline  (VIBRA -TABS) 100 MG tablet Take 1 tablet (100 mg total) by mouth 2 (two) times daily.   [DISCONTINUED] predniSONE  (DELTASONE ) 10 MG tablet Take 4 tablets daily x 5 days (Patient not taking: Reported on 09/20/2024)   [DISCONTINUED] predniSONE  (DELTASONE ) 10 MG tablet Take 4 tabs  daily with food x 4 days, then 3 tabs daily x 4 days, then 2 tabs daily x 4 days, then 1 tab daily x4 days then stop. #40 (Patient not taking: Reported on 09/20/2024)   No facility-administered encounter medications on file as of 09/20/2024.    History: Past Medical History:  Diagnosis Date   Abdominal aortic aneurysm (AAA)    EVAR Dr. Lanis  2023   Alcohol abuse 12/27/2010   Allergic rhinitis, cause unspecified 12/27/2010   Allergy    Anal warts 04/11/2012   CAD, NATIVE VESSEL 10/04/2010   Cataract    COPD (chronic obstructive pulmonary disease) (HCC)    Stage 4 uses o2 3 L continous   History of blood transfusion 03/2012   related to nose bleed   HYPERLIPIDEMIA-MIXED 10/04/2010   HYPERTENSION, BENIGN 10/04/2010   Myocardial infarction (HCC) 09/16/2010   x2   On home O2    Pneumonia    when I was a kid   Stented coronary artery    Mid LAD    TOBACCO ABUSE 10/04/2010   Past Surgical History:  Procedure Laterality Date   ABDOMINAL AORTIC ENDOVASCULAR STENT GRAFT N/A 07/28/2022   Procedure: ABDOMINAL AORTIC ENDOVASCULAR STENT GRAFT;  Surgeon: Lanis Fonda BRAVO, MD;  [facility]  OR;  Service: Vascular;  Laterality: N/A;   CARDIAC CATHETERIZATION  ~ 2012   COLONOSCOPY N/A 05/25/2024   Procedure: COLONOSCOPY;  Surgeon: [name], MD;  Location: [facility] ENDOSCOPY;  Service: Gastroenterology;  Laterality: N/A;   CORONARY ANGIOPLASTY WITH STENT PLACEMENT  09/17/2010; 09/28/2014   1; 2   FINGER SURGERY Left    almost cut  off tip of 2nd digit   LEFT HEART CATHETERIZATION WITH CORONARY ANGIOGRAM N/A 09/28/2014   Procedure: LEFT HEART CATHETERIZATION WITH CORONARY ANGIOGRAM;  Surgeon: Lonni JONETTA Cash, MD;  Location: [facility] CATH LAB;  Service: Cardiovascular;  Laterality: N/A;   NASAL ENDOSCOPY WITH EPISTAXIS CONTROL Bilateral 03/2012   ULTRASOUND GUIDANCE FOR VASCULAR ACCESS N/A 07/28/2022   Procedure: ULTRASOUND GUIDANCE FOR VASCULAR ACCESS;  Surgeon: Lanis Fonda BRAVO, MD;  Location: [facility] OR;  Service: Vascular;  Laterality: N/A;   VASECTOMY     Family History  Problem Relation Age of Onset   Brain cancer Mother    Cancer Mother    ADD / ADHD Mother    Heart disease Father    Social History   Occupational History   Occupation: Geographical information systems officer work     Associate Professor: [employer]    Comment: [employer]  Tobacco Use   Smoking status: Former    Current packs/day: 0.00    Average packs/day: 2.0 packs/day for 51.7 years (103.3 ttl pk-yrs)    Types: Cigarettes    Start date: 11    Quit date: 04/18/2020    Years since quitting: 4.4   Smokeless tobacco: Never   Tobacco comments:    2 plus packs per day. He has a 100 + pack - year history of tobacco abuse currently. Former 4 ppd for 25 years.  Vaping Use   Vaping status: Never Used  Substance and Sexual Activity   Alcohol use: Not Currently    Alcohol/week: 144.0 standard drinks of alcohol    Types: 144 Cans of beer per week    Comment:  history 12 pack of beer per night.  2021- drinks 3 beer/day   Drug use: Never   Sexual activity: Not Currently    Birth control/protection: None   Tobacco Counseling Counseling given: Yes Tobacco comments: 2 plus packs per day. He has a 100 + pack - year history of tobacco abuse currently. Former 4 ppd for 25 years.  SDOH Screenings   Food Insecurity: No Food Insecurity (09/13/2024)  Housing: Low Risk (09/13/2024)  Transportation Needs: No Transportation Needs (09/13/2024)  Utilities: Not At Risk (09/20/2024)   Alcohol Screen: Low Risk (09/10/2023)  Depression (PHQ2-9): Low Risk (09/20/2024)  Recent Concern: Depression (PHQ2-9) - Medium Risk (08/11/2024)  Financial Resource Strain: Low Risk (09/13/2024)  Physical Activity: Inactive (09/13/2024)  Social Connections: Moderately Isolated (09/13/2024)  Stress: No Stress Concern Present (09/13/2024)  Tobacco Use: Medium Risk (09/20/2024)  Health Literacy: Adequate Health Literacy (09/20/2024)   See flowsheets for full screening details  Depression Screen PHQ 2 & 9 Depression Scale- Over the past 2 weeks, how often have you been bothered by any of the following problems? Little interest or pleasure in doing things: 0 Feeling down, depressed, or hopeless (PHQ Adolescent also includes...irritable): 0 PHQ-2 Total Score: 0 Trouble falling or staying asleep, or sleeping too much: 0 Feeling tired or having little energy: 0 Poor appetite or overeating (PHQ Adolescent also includes...weight loss): 0 Feeling bad about yourself - or that you are a failure or have let yourself or your family down: 0 Trouble concentrating  on things, such as reading the newspaper or watching television [facility] Adolescent also includes...like school work): 0 Moving or speaking so slowly that other people could have noticed. Or the opposite - being so fidgety or restless that you have been moving around a lot more than usual: 0 Thoughts that you would be better off dead, or of hurting yourself in some way: 0 PHQ-9 Total Score: 0 If you checked off any problems, how difficult have these problems made it for you to do your work, take care of things at home, or get along with other people?: Somewhat difficult  Depression Treatment Depression Interventions/Treatment : EYV7-0 Score <4 Follow-up Not Indicated     Goals Addressed             This Visit's Progress    Patient Stated   On track    Patient states that his goal is to stay alive.             Objective:     Today's Vitals   09/20/24 1447  BP: 130/87  Weight: 145 lb (65.8 kg)  Height: 5' 9 (1.753 m)   Body mass index is 21.41 kg/m.  Hearing/Vision screen Hearing Screening - Comments:: Patient denies any hearing difficulties.   Vision Screening - Comments:: Wears rx glasses - up to date with routine eye exams with  Oneil Kawasaki Immunizations and Health Maintenance Health Maintenance  Topic Date Due   DTaP/Tdap/Td (1 - Tdap) Never done   Zoster Vaccines- Shingrix (1 of 2) Never done   Medicare Annual Wellness (AWV)  09/09/2024   COVID-19 Vaccine (6 - Moderna risk 2025-26 season) 12/25/2024   Lung Cancer Screening  03/05/2025   Pneumococcal Vaccine: 50+ Years (3 of 3 - PCV20 or PCV21) 10/10/2026   Influenza Vaccine  Completed   Hepatitis C Screening  Completed   Meningococcal B Vaccine  Aged Out   Hepatitis B Vaccines 19-59 Average Risk  Discontinued   Colonoscopy  Discontinued   Fecal DNA (Cologuard)  Discontinued        Assessment/Plan:  This is a routine wellness examination for John [name].  Patient Care Team: Tobie Suzzane POUR, MD as PCP - General (Internal Medicine) Shaaron, Lamar HERO, MD as Consulting Physician (Gastroenterology) Kawasaki Oneil, DO as Consulting Physician (Optometry) Davonna Siad, MD as Medical Oncologist (Medical Oncology) Celestia Joesph SQUIBB, RN as Oncology Nurse Navigator (Medical Oncology) Verlin Lonni BIRCH, MD as Consulting Physician (Cardiology)  I have personally reviewed and noted the following in the patients chart:   Medical and social history Use of alcohol, tobacco or illicit drugs  Current medications and supplements including opioid prescriptions. Functional ability and status Nutritional status Physical activity Advanced directives List of other physicians Hospitalizations, surgeries, and ER visits in previous 12 months Vitals Screenings to include cognitive, depression, and falls Referrals and appointments  No orders of the  defined types were placed in this encounter.  In addition, I have reviewed and discussed with patient certain preventive protocols, quality metrics, and best practice recommendations. A written personalized care plan for preventive services as well as general preventive health recommendations were provided to patient.   Gaynelle Pastrana, CMA   09/20/2024   Return for Medicare Wellness with Wellness Nurse on Wednesday September 21, 2025 at 3:10 pm in office.  After Visit Summary: (MyChart) Due to this being a telephonic visit, the after visit summary with patients personalized plan was offered to patient via MyChart   "

## 2024-09-20 NOTE — Patient Instructions (Signed)
 [name],  Thank you for taking the time for your Medicare Wellness Visit. I appreciate your continued commitment to your health goals. Please review the care plan we discussed, and feel free to reach out if I can assist you further.  Please note that Annual Wellness Visits do not include a physical exam. Some assessments may be limited, especially if the visit was conducted virtually. If needed, we may recommend an in-person follow-up with your provider.  Ongoing Care Seeing your primary care provider every 3 to 6 months helps us  monitor your health and provide consistent, personalized care.   1 year follow up for Medicare well visit: Wednesday September 21, 2025 at 3:10 pm with medicare wellness nurse in office  Recommended Screenings:  Health Maintenance  Topic Date Due   DTaP/Tdap/Td vaccine (1 - Tdap) Never done   Zoster (Shingles) Vaccine (1 of 2) Never done   Medicare Annual Wellness Visit  09/09/2024   COVID-19 Vaccine (6 - Moderna risk 2025-26 season) 12/25/2024   Screening for Lung Cancer  03/05/2025   Pneumococcal Vaccine for age over 72 (3 of 3 - PCV20 or PCV21) 10/10/2026   Flu Shot  Completed   Hepatitis C Screening  Completed   Meningitis B Vaccine  Aged Out   Hepatitis B Vaccine  Discontinued   Colon Cancer Screening  Discontinued   Cologuard (Stool DNA test)  Discontinued       09/13/2024   10:48 AM  Advanced Directives  Does Patient Have a Medical Advance Directive? Yes  Type of Estate Agent of [place];Living will  Copy of Healthcare Power of Attorney in Chart? Yes - validated most recent copy scanned in chart (See row information)    Vision: Annual vision screenings are recommended for early detection of glaucoma, cataracts, and diabetic retinopathy. These exams can also reveal signs of chronic conditions such as diabetes and high blood pressure.  Dental: Annual dental screenings help detect early signs of oral cancer, gum disease,  and other conditions linked to overall health, including heart disease and diabetes.  Please see the attached documents for additional preventive care recommendations.

## 2024-10-26 ENCOUNTER — Telehealth (HOSPITAL_BASED_OUTPATIENT_CLINIC_OR_DEPARTMENT_OTHER): Payer: Self-pay | Admitting: Pulmonary Disease

## 2024-10-26 NOTE — Telephone Encounter (Signed)
 Pt has appt on for 10/27/2024

## 2024-10-26 NOTE — Telephone Encounter (Unsigned)
 Copied from CRM [id number]. Topic: Clinical - Refused Triage >> Oct 26, 2024  1:51 PM John [name] wrote: Patient/caller voiced complaints of worsening breathing. Declined transfer to triage. States knows when to call an ambulance and will not hesitate. Offered triage as a precaution post-scheduling of acute.

## 2024-10-27 ENCOUNTER — Ambulatory Visit (INDEPENDENT_AMBULATORY_CARE_PROVIDER_SITE_OTHER)

## 2024-10-27 ENCOUNTER — Encounter (HOSPITAL_BASED_OUTPATIENT_CLINIC_OR_DEPARTMENT_OTHER): Payer: Self-pay

## 2024-10-27 VITALS — BP 142/82 | HR 112 | Ht 69.0 in | Wt 145.0 lb

## 2024-10-27 DIAGNOSIS — J9612 Chronic respiratory failure with hypercapnia: Secondary | ICD-10-CM

## 2024-10-27 DIAGNOSIS — J439 Emphysema, unspecified: Secondary | ICD-10-CM | POA: Diagnosis not present

## 2024-10-27 DIAGNOSIS — Z87891 Personal history of nicotine dependence: Secondary | ICD-10-CM | POA: Diagnosis not present

## 2024-10-27 DIAGNOSIS — J441 Chronic obstructive pulmonary disease with (acute) exacerbation: Secondary | ICD-10-CM

## 2024-10-27 DIAGNOSIS — J9611 Chronic respiratory failure with hypoxia: Secondary | ICD-10-CM

## 2024-10-27 DIAGNOSIS — K6389 Other specified diseases of intestine: Secondary | ICD-10-CM

## 2024-10-27 DIAGNOSIS — R911 Solitary pulmonary nodule: Secondary | ICD-10-CM

## 2024-10-27 DIAGNOSIS — R0602 Shortness of breath: Secondary | ICD-10-CM

## 2024-10-27 MED ORDER — PREDNISONE 10 MG PO TABS: ORAL_TABLET | ORAL | 0 refills | Status: AC

## 2024-10-27 MED ORDER — AZITHROMYCIN 250 MG PO TABS: ORAL_TABLET | ORAL | 0 refills | Status: AC

## 2024-10-27 NOTE — Patient Instructions (Addendum)
 Complete STAT Chest CT Angio as ordered.  Start Prednisone  and Z pak  Continue Breztri  twice daily  Continue Albuterol  every 4-6 hours as needed.  Follow up as scheduled with Dr. Jude in March.  Return sooner if new or worsening symptoms.

## 2024-10-27 NOTE — Progress Notes (Signed)
 "  @Patient  ID: John [name], male    DOB: 1955-12-12, 69 y.o.   MRN: [id number]  Chief Complaint  Patient presents with   Acute Visit    Referring provider: Tobie Suzzane POUR, MD  HPI: Discussed the use of AI scribe software for clinical note transcription with the patient, who gave verbal consent to proceed.  History of Present Illness John [name] is a 69 year old male with PMH of severe COPD, chronic respiratory failure and colon cancer who presents with worsening shortness of breath.  He has experienced worsening shortness of breath over the last few weeks, with significant deterioration in the past few days. He feels severely 'winded' and his breathing difficulties have become severe enough that his wife considered calling an ambulance. Despite using a nebulizer two to four times a day, he finds it increasingly difficult to breathe.  He has a history of COPD and uses Breztri , which he receives through a prescription assistance program. There have been concerns about the renewal of this program, but he has received a shipment for January through March. He experienced an illness in November, for which he received prescriptions that helped somewhat, but he has not returned to his pre-illness baseline. He reports that his blood pressure spikes when he moves around, and he feels his heart 'beating out of his chest' even with minimal exertion.  He reports a mainly sedentary lifestyle.  He has not been using his ventilator at night for over a week due to discomfort and difficulty sleeping with it. He mentions that he can tolerate it better when sitting in his recliner. He has attempted to use it for short periods but finds it challenging to continue due to breathing difficulties.  He has a history of colon cancer diagnosed in September. He has elected not to pursue treatment for his colon cancer.  He reports a decrease in appetite and weight loss, noting that he can only eat  half of what he used to. He also mentions some drainage in his throat but denies any chest pain or productive cough.  He experiences occasional swelling in his right foot, which he manages by elevating it.   His wife notes that his mobility is limited. He spends most of his time in a recliner watching TV. He experiences palpitations and shortness of breath with minimal exertion, such as walking to the bathroom.  Last OV 08/26/2024:  69 y.o. 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    -He is on 3 L oxygen  in the daytime, 4 L on exertion, unable to use NIV during sleep but tries to use at least 4 hours in the daytime.  -100 pack-year smoking history , quit 2021 -assessment at [facility] in July 2022 -NOT a candidate for lung transplant due to coronary artery disease and infrarenal abdominal aortic aneurysm  John [name] is a 69 year old male with end stage refractory COPD who presents for follow-up of respiratory failure with hypoxia and hypercarbia. He is accompanied by his daughter, [name].   He has persistent cough with a rattling sensation and ongoing gray-green sputum despite recent azithromycin  and prednisone . He takes Mucinex  regularly but feels it does not adequately control his symptoms.   Home oxygen  saturations fluctuate, with a drop to 84% noted on arrival today and up to 96-97% when sitting quietly. He is worried about needing higher oxygen  flow and would like to reduce his  setting if safely possible.   He now uses albuterol  nebulizer, mainly at bedtime, which he did not require before this recent illness. He struggles with noninvasive ventilator use, tolerating about an hour before feeling suffocated and removing it. He feels the machine does not provide enough air, so it is difficult to use during sleep.   He uses Breztri , two puffs twice daily, and has experienced side effects when  exceeding that dose. He uses the ventilator for at least four hours daily but has trouble using it at night due to discomfort and fear of not waking up.   [name] asks about the needed frequency of breathing tests, CT scans, and whether a repeat arterial blood gas is indicated, referencing prior tests in 2021.       08/16/24 Zpak + pred   PET 03/2024 reviewed Pall care note reviewed     Significant tests/ events reviewed   Pulmonary testing:  PFT 12/09/19 >> FEV1 0.59 (16%), FEV1% 37, DLCO 27% A1AT 03/09/20 >> 110, MZ ABG 02/12/21 >> pH 7.28, PCO2 80.9, PO2 109 on 40% FiO2 PFT 04/11/21 >> FEV1 0.6 (18%), FEV1% 26, DLCO 21%   Chest Imaging:  CT angio chest 11/23/19 >> moderate centrilobular emphysema, mild infrahilar BTX, 6 mm nodule LUL CT chest 01/02/21 >> no change LUL nodule, new 1.1 cm LLL opacity CT chest 03/28/21 >> severe emphysema, 2.2 x 0.8 cm band like opacity RLL from ATX, mucus plugging, 3 mm nodule RUL, 1.6 x 0.7 cm nodularity LUL CT chest 04/09/21 >> diffuse centrilobular emphysema, stable nodule LUL, 8 mm nodule RLL CT chest 03/27/22 >> stable GGO LLL CT chest 03/17/23 >> stable GGO LLL   Allergies[1]  Immunization History  Administered Date(s) Administered    sv, Bivalent, Protein Subunit Rsvpref,pf (Abrysvo) 09/12/2023   Fluad Quad(high Dose 65+) 06/23/2022   Fluad Trivalent(High Dose 65+) 07/23/2023   Hep A / Hep B 04/10/2021   Hepatitis B, ADULT 04/10/2021   INFLUENZA, HIGH DOSE SEASONAL PF 06/10/2024   Influenza,inj,Quad PF,6+ Mos 07/05/2020, 07/31/2021   Influenza-Unspecified 07/05/2020   Moderna Covid-19 Fall Seasonal Vaccine 45yrs & older 08/11/2023, 06/26/2024   Moderna Sars-Covid-2 Vaccination 12/15/2019, 01/12/2020, 08/15/2020   Pneumococcal Conjugate-13 10/10/2021   Pneumococcal Polysaccharide-23 10/11/2020    Past Medical History:  Diagnosis Date   Abdominal aortic aneurysm (AAA)    EVAR Dr. Lanis  2023   Alcohol abuse 12/27/2010   Allergic  rhinitis, cause unspecified 12/27/2010   Allergy    Anal warts 04/11/2012   CAD, NATIVE VESSEL 10/04/2010   Cataract    COPD (chronic obstructive pulmonary disease) (HCC)    Stage 4 uses o2 3 L continous   History of blood transfusion 03/2012   related to nose bleed   HYPERLIPIDEMIA-MIXED 10/04/2010   HYPERTENSION, BENIGN 10/04/2010   Myocardial infarction (HCC) 09/16/2010   x2   On home O2    Pneumonia    when I was a kid   Stented coronary artery    Mid LAD    TOBACCO ABUSE 10/04/2010    Tobacco History: Tobacco Use History[2] Counseling given: Not Answered Tobacco comments: 2 plus packs per day. He has a 100 + pack - year history of tobacco abuse currently. Former 4 ppd for 25 years.   Outpatient Medications Prior to Visit  Medication Sig Dispense Refill   albuterol  (PROAIR  HFA) 108 (90 Base) MCG/ACT inhaler Inhale 2 puffs into the lungs every 4 (four) hours as needed for wheezing or shortness of breath. 2 puffs  every 4 hours as needed only  if your can't catch your breath 8 g 5   albuterol  (PROVENTIL ) (2.5 MG/3ML) 0.083% nebulizer solution Take 3 mLs (2.5 mg total) by nebulization every 4 (four) hours as needed for wheezing or shortness of breath. 360 mL 5   aspirin  EC 81 MG tablet Take 81 mg by mouth daily.     atorvastatin  (LIPITOR ) 40 MG tablet Take 1 tablet (40 mg total) by mouth daily. 90 tablet 3   budesonide -glycopyrrolate -formoterol  (BREZTRI  AEROSPHERE) 160-9-4.8 MCG/ACT AERO inhaler Inhale 2 puffs into the lungs in the morning and at bedtime. 10.7 g 11   furosemide  (LASIX ) 40 MG tablet Take 1 tablet (40 mg total) by mouth daily. 90 tablet 3   guaiFENesin  (MUCINEX ) 600 MG 12 hr tablet Take 2 tablets (1,200 mg total) by mouth 2 (two) times daily as needed for cough or to loosen phlegm.     Magnesium  250 MG CAPS Take by mouth daily.     Multiple Vitamin (MULTIVITAMIN WITH MINERALS) TABS tablet Take 1 tablet by mouth daily.     nitroGLYCERIN  (NITROSTAT ) 0.4 MG SL  tablet Place 1 tablet (0.4 mg total) under the tongue every 5 (five) minutes as needed for chest pain. 25 tablet 6   OXYGEN  Inhale 2-3 L into the lungs.     sodium chloride  HYPERTONIC 3 % nebulizer solution Take by nebulization 2 (two) times daily as needed for other. 250 mL 3   Spacer/Aero-Holding Raguel FRENCH Use twice daily as directed 1 each 0   No facility-administered medications prior to visit.     Review of Systems: as per hpi  Constitutional:   No  weight loss, night sweats,  Fevers, chills, fatigue, or  lassitude.  HEENT:   No headaches,  Difficulty swallowing,  Tooth/dental problems, or  Sore throat,                No sneezing, itching, ear ache, nasal congestion, post nasal drip,   CV:  No chest pain,  Orthopnea, PND, swelling in lower extremities, anasarca, dizziness, palpitations, syncope.   GI  No heartburn, indigestion, abdominal pain, nausea, vomiting, diarrhea, change in bowel habits, loss of appetite, bloody stools.   Resp: No shortness of breath with exertion or at rest.  No excess mucus, no productive cough,  No non-productive cough,  No coughing up of blood.  No change in color of mucus.  No wheezing.  No chest wall deformity  Skin: no rash or lesions.  GU: no dysuria, change in color of urine, no urgency or frequency.  No flank pain, no hematuria   MS:  No joint pain or swelling.  No decreased range of motion.  No back pain.    Physical Exam  BP (!) 142/82   Pulse (!) 112   Ht 5' 9 (1.753 m)   Wt 145 lb (65.8 kg)   SpO2 99% Comment: 3 liters  BMI 21.41 kg/m   GEN: A/Ox3; pleasant , NAD, well nourished    HEENT:  Carter/AT,  EACs-clear, TMs-wnl, NOSE-clear, THROAT-clear, no lesions, no postnasal drip or exudate noted.   NECK:  Supple w/ fair ROM; no JVD; normal carotid impulses w/o bruits; no thyromegaly or nodules palpated; no lymphadenopathy.    RESP  Clear but diminished throughout P & A; w/o, wheezes/ rales/ or rhonchi. no accessory muscle use, no  dullness to percussion  CARD:  RRR, no m/r/g, no peripheral edema, pulses intact, no cyanosis or clubbing.  GI:   Soft &  nt; nml bowel sounds; no organomegaly or masses detected.   Musco: Warm bil, no deformities or joint swelling noted. Negative Homan's sign bilaterally.  Neuro: alert, no focal deficits noted.    Skin: Warm, no lesions or rashes    Lab Results:  CBC    Component Value Date/Time   WBC 8.3 10/21/2023 1341   WBC 9.4 07/29/2022 0411   RBC 4.01 (L) 10/21/2023 1341   RBC 2.69 (L) 07/29/2022 0411   HGB 12.9 (L) 10/21/2023 1341   HCT 39.5 10/21/2023 1341   PLT 198 10/21/2023 1341   MCV 99 (H) 10/21/2023 1341   MCH 32.2 10/21/2023 1341   MCH 30.5 07/29/2022 0411   MCHC 32.7 10/21/2023 1341   MCHC 32.5 07/29/2022 0411   RDW 12.4 10/21/2023 1341   LYMPHSABS 0.9 10/21/2023 1341   MONOABS 1.6 (H) 07/27/2022 2106   EOSABS 1.1 (H) 10/21/2023 1341   BASOSABS 0.1 10/21/2023 1341    BMET    Component Value Date/Time   NA 145 (H) 10/21/2023 1341   K 4.5 10/21/2023 1341   CL 96 10/21/2023 1341   CO2 38 (H) 10/21/2023 1341   GLUCOSE 101 (H) 10/21/2023 1341   GLUCOSE 96 07/30/2022 0414   GLUCOSE 123 (H) 08/15/2006 0929   BUN 17 10/21/2023 1341   CREATININE 1.12 10/21/2023 1341   CALCIUM  9.5 10/21/2023 1341   GFRNONAA 48 (L) 07/30/2022 0414   GFRAA >60 04/23/2020 0832    BNP    Component Value Date/Time   BNP 75.0 12/20/2020 2309    ProBNP    Component Value Date/Time   PROBNP 3,582 (H) 11/23/2019 1237    Imaging: No results found.  Administration History     None          Latest Ref Rng & Units 05/27/2024    8:45 AM 12/09/2019   12:47 PM  PFT Results  FVC-Pre L 1.71  1.67   FVC-Predicted Pre % 36  34   FVC-Post L  1.62   FVC-Predicted Post %  33   Pre FEV1/FVC % % 33  35   Post FEV1/FCV % %  37   FEV1-Pre L 0.57  0.58   FEV1-Predicted Pre % 16  15   FEV1-Post L  0.59   DLCO uncorrected ml/min/mmHg 5.45  7.65   DLCO UNC% % 19  27    DLCO corrected ml/min/mmHg  7.13   DLCO COR %Predicted %  25   DLVA Predicted % 34  31     No results found for: NITRICOXIDE   Assessment & Plan:   Assessment & Plan Shortness of breath  Chronic respiratory failure with hypoxia and hypercapnia (HCC)  COPD with acute exacerbation (HCC)  Lung nodule  Colonic mass -  Patient refuses treatment  Assessment and Plan Assessment & Plan COPD exacerbation with chronic respiratory failure Significant dyspnea worsening, increased nebulizer use, no cough or sputum. Differential includes COPD exacerbation and pulmonary embolism. - Offered transfer to ED for more expedient evaluation; patient/wife refused. - Treated as COPD exacerbation with medication; Rx sent to pharmacy of choice. - Ordered CT scan to rule out pulmonary embolism. - Encouraged use of ventilator.  Suspected pulmonary embolism Increased risk due to cancer and sedentary lifestyle. Symptoms include dyspnea and elevated heart rate. - Ordered CT scan to rule out pulmonary embolism. - Consider blood thinner if pulmonary embolism confirmed.  Pulmonary emphysema Chronic condition contributing to respiratory symptoms.  Chronic respiratory failure with hypoxia and hypercapnia Chronic condition  with recent exacerbation of symptoms. - Continue current management plan for chronic respiratory failure.    Return for CT results.  Keep follow up as scheduled in March.  Candis Dandy, PA-C 10/27/2024      [1]  Allergies Allergen Reactions   Penicillins Other (See Comments)    Passes out  [2]  Social History Tobacco Use  Smoking Status Former   Current packs/day: 0.00   Average packs/day: 2.0 packs/day for 51.7 years (103.3 ttl pk-yrs)   Types: Cigarettes   Start date: 77   Quit date: 04/18/2020   Years since quitting: 4.5  Smokeless Tobacco Never  Tobacco Comments   2 plus packs per day. He has a 100 + pack - year history of tobacco abuse currently. Former 4  ppd for 25 years.   "

## 2024-10-28 ENCOUNTER — Ambulatory Visit (HOSPITAL_COMMUNITY): Admission: RE | Admit: 2024-10-28 | Discharge: 2024-10-28

## 2024-10-28 ENCOUNTER — Ambulatory Visit (HOSPITAL_BASED_OUTPATIENT_CLINIC_OR_DEPARTMENT_OTHER): Payer: Self-pay

## 2024-10-28 ENCOUNTER — Other Ambulatory Visit (HOSPITAL_BASED_OUTPATIENT_CLINIC_OR_DEPARTMENT_OTHER)

## 2024-10-28 DIAGNOSIS — R0602 Shortness of breath: Secondary | ICD-10-CM

## 2024-10-28 MED ORDER — IOHEXOL 350 MG/ML SOLN
75.0000 mL | Freq: Once | INTRAVENOUS | Status: AC | PRN
  Administered 2024-10-28: 75 mL via INTRAVENOUS

## 2024-11-08 ENCOUNTER — Encounter: Admitting: Internal Medicine

## 2024-11-22 ENCOUNTER — Ambulatory Visit (HOSPITAL_BASED_OUTPATIENT_CLINIC_OR_DEPARTMENT_OTHER): Admitting: Pulmonary Disease

## 2025-09-21 ENCOUNTER — Ambulatory Visit: Payer: Self-pay
